# Patient Record
Sex: Male | Born: 1965 | Race: Black or African American | Hispanic: No | Marital: Single | Smoking: Never smoker
Health system: Southern US, Community
[De-identification: ages and names within clinical notes are randomized; demographics above are authoritative.]

## PROBLEM LIST (undated history)

## (undated) DIAGNOSIS — K219 Gastro-esophageal reflux disease without esophagitis: Secondary | ICD-10-CM

## (undated) DIAGNOSIS — K635 Polyp of colon: Secondary | ICD-10-CM

## (undated) DIAGNOSIS — K2210 Ulcer of esophagus without bleeding: Secondary | ICD-10-CM

## (undated) DIAGNOSIS — F32A Depression, unspecified: Secondary | ICD-10-CM

## (undated) DIAGNOSIS — M1710 Unilateral primary osteoarthritis, unspecified knee: Secondary | ICD-10-CM

## (undated) DIAGNOSIS — I10 Essential (primary) hypertension: Secondary | ICD-10-CM

## (undated) DIAGNOSIS — G479 Sleep disorder, unspecified: Secondary | ICD-10-CM

## (undated) DIAGNOSIS — K5732 Diverticulitis of large intestine without perforation or abscess without bleeding: Secondary | ICD-10-CM

## (undated) DIAGNOSIS — G4730 Sleep apnea, unspecified: Secondary | ICD-10-CM

## (undated) DIAGNOSIS — G8929 Other chronic pain: Secondary | ICD-10-CM

## (undated) DIAGNOSIS — M179 Osteoarthritis of knee, unspecified: Secondary | ICD-10-CM

## (undated) DIAGNOSIS — E669 Obesity, unspecified: Secondary | ICD-10-CM

## (undated) DIAGNOSIS — F419 Anxiety disorder, unspecified: Secondary | ICD-10-CM

## (undated) DIAGNOSIS — J301 Allergic rhinitis due to pollen: Secondary | ICD-10-CM

## (undated) DIAGNOSIS — D649 Anemia, unspecified: Secondary | ICD-10-CM

## (undated) DIAGNOSIS — K589 Irritable bowel syndrome without diarrhea: Secondary | ICD-10-CM

## (undated) DIAGNOSIS — K5792 Diverticulitis of intestine, part unspecified, without perforation or abscess without bleeding: Secondary | ICD-10-CM

## (undated) HISTORY — PX: EYE SURGERY: SHX253

## (undated) HISTORY — DX: Other chronic pain: G89.29

## (undated) HISTORY — PX: COLON SURGERY: SHX602

## (undated) HISTORY — DX: Sleep disorder, unspecified: G47.9

## (undated) HISTORY — PX: COLONOSCOPY: SHX174

## (undated) HISTORY — DX: Gastro-esophageal reflux disease without esophagitis: K21.9

## (undated) HISTORY — DX: Osteoarthritis of knee, unspecified: M17.9

## (undated) HISTORY — DX: Ulcer of esophagus without bleeding: K22.10

## (undated) HISTORY — DX: Diverticulitis of large intestine without perforation or abscess without bleeding: K57.32

## (undated) HISTORY — DX: Unilateral primary osteoarthritis, unspecified knee: M17.10

## (undated) HISTORY — DX: Irritable bowel syndrome, unspecified: K58.9

## (undated) HISTORY — PX: ROTATOR CUFF REPAIR: SHX139

## (undated) HISTORY — DX: Polyp of colon: K63.5

## (undated) HISTORY — DX: Obesity, unspecified: E66.9

## (undated) HISTORY — DX: Allergic rhinitis due to pollen: J30.1

## (undated) HISTORY — DX: Depression, unspecified: F32.A

## (undated) HISTORY — PX: CHOLECYSTECTOMY: SHX55

---

## 2004-12-26 HISTORY — PX: COLON RESECTION: SHX5231

## 2006-08-08 ENCOUNTER — Ambulatory Visit: Payer: Self-pay | Admitting: Anesthesiology

## 2006-10-19 ENCOUNTER — Ambulatory Visit: Payer: Self-pay | Admitting: Anesthesiology

## 2006-11-27 ENCOUNTER — Ambulatory Visit: Payer: Self-pay | Admitting: Anesthesiology

## 2006-12-26 HISTORY — PX: ACHILLES TENDON REPAIR: SUR1153

## 2007-01-02 ENCOUNTER — Ambulatory Visit: Payer: Self-pay | Admitting: Anesthesiology

## 2007-12-27 HISTORY — PX: GASTRIC BYPASS: SHX52

## 2013-05-29 ENCOUNTER — Encounter: Payer: Self-pay | Admitting: Internal Medicine

## 2013-05-29 ENCOUNTER — Ambulatory Visit (INDEPENDENT_AMBULATORY_CARE_PROVIDER_SITE_OTHER): Payer: BC Managed Care – PPO | Admitting: Internal Medicine

## 2013-05-29 VITALS — BP 120/80 | HR 88 | Temp 98.4°F | Ht 71.0 in | Wt 295.0 lb

## 2013-05-29 DIAGNOSIS — K635 Polyp of colon: Secondary | ICD-10-CM | POA: Insufficient documentation

## 2013-05-29 DIAGNOSIS — Z23 Encounter for immunization: Secondary | ICD-10-CM

## 2013-05-29 DIAGNOSIS — J301 Allergic rhinitis due to pollen: Secondary | ICD-10-CM

## 2013-05-29 DIAGNOSIS — K5731 Diverticulosis of large intestine without perforation or abscess with bleeding: Secondary | ICD-10-CM | POA: Insufficient documentation

## 2013-05-29 DIAGNOSIS — G479 Sleep disorder, unspecified: Secondary | ICD-10-CM

## 2013-05-29 DIAGNOSIS — M1710 Unilateral primary osteoarthritis, unspecified knee: Secondary | ICD-10-CM

## 2013-05-29 DIAGNOSIS — M179 Osteoarthritis of knee, unspecified: Secondary | ICD-10-CM

## 2013-05-29 DIAGNOSIS — IMO0002 Reserved for concepts with insufficient information to code with codable children: Secondary | ICD-10-CM

## 2013-05-29 DIAGNOSIS — E669 Obesity, unspecified: Secondary | ICD-10-CM | POA: Insufficient documentation

## 2013-05-29 LAB — BASIC METABOLIC PANEL
BUN: 18 mg/dL (ref 6–23)
CO2: 23 mEq/L (ref 19–32)
Calcium: 9.8 mg/dL (ref 8.4–10.5)
Chloride: 106 mEq/L (ref 96–112)
Creatinine, Ser: 1.1 mg/dL (ref 0.4–1.5)
GFR: 94.44 mL/min (ref >60.00)
Glucose, Bld: 89 mg/dL (ref 70–99)
Potassium: 4.8 mEq/L (ref 3.5–5.1)
Sodium: 139 mEq/L (ref 135–145)

## 2013-05-29 LAB — CBC WITH DIFFERENTIAL/PLATELET
Basophils Absolute: 0 10*3/uL (ref 0.0–0.1)
Basophils Relative: 0.2 % (ref 0.0–3.0)
Eosinophils Absolute: 0.2 10*3/uL (ref 0.0–0.7)
Eosinophils Relative: 2.5 % (ref 0.0–5.0)
HCT: 44.7 % (ref 39.0–52.0)
Hemoglobin: 15.4 g/dL (ref 13.0–17.0)
Lymphocytes Relative: 18.4 % (ref 12.0–46.0)
Lymphs Abs: 1.4 10*3/uL (ref 0.7–4.0)
MCHC: 34.5 g/dL (ref 30.0–36.0)
MCV: 89.5 fl (ref 78.0–100.0)
Monocytes Absolute: 0.6 10*3/uL (ref 0.1–1.0)
Monocytes Relative: 7.8 % (ref 3.0–12.0)
Neutro Abs: 5.4 10*3/uL (ref 1.4–7.7)
Neutrophils Relative %: 71.1 % (ref 43.0–77.0)
Platelets: 298 10*3/uL (ref 150.0–400.0)
RBC: 4.99 Mil/uL (ref 4.22–5.81)
RDW: 13.9 % (ref 11.5–14.6)
WBC: 7.6 10*3/uL (ref 4.5–10.5)

## 2013-05-29 LAB — TSH: TSH: 1.66 u[IU]/mL (ref 0.35–5.50)

## 2013-05-29 LAB — HEPATIC FUNCTION PANEL
ALT: 24 U/L (ref 0–53)
AST: 21 U/L (ref 0–37)
Albumin: 4.2 g/dL (ref 3.5–5.2)
Alkaline Phosphatase: 58 U/L (ref 39–117)
Bilirubin, Direct: 0 mg/dL (ref 0.0–0.3)
Total Bilirubin: 0.6 mg/dL (ref 0.3–1.2)
Total Protein: 7.8 g/dL (ref 6.0–8.3)

## 2013-05-29 MED ORDER — PHENTERMINE HCL 37.5 MG PO CAPS: 37.5000 mg | ORAL_CAPSULE | ORAL | Status: DC

## 2013-05-29 MED ORDER — OXYCODONE-ACETAMINOPHEN 5-325 MG PO TABS: 1.0000 | ORAL_TABLET | Freq: Every evening | ORAL | Status: DC | PRN

## 2013-05-29 MED ORDER — TADALAFIL 20 MG PO TABS: 10.0000 mg | ORAL_TABLET | ORAL | Status: DC | PRN

## 2013-05-29 NOTE — Patient Instructions (Signed)
Consider trying glucosamine/chondroitin tabs and capsaicin cream (both available over the counter)--to help your knee pain.  Please get records from Dr Deeann Saint

## 2013-05-29 NOTE — Assessment & Plan Note (Signed)
Chronic problem He wants to hold off on meds since he has to increase doses rapidly

## 2013-05-29 NOTE — Assessment & Plan Note (Signed)
Ongoing pain Discussed other alternatives Will give small amount of percocet for emergency severe pain Consider cortisone injection

## 2013-05-29 NOTE — Assessment & Plan Note (Signed)
Wants to go back on the phentermine Will go ahead and refill

## 2013-05-29 NOTE — Progress Notes (Signed)
Subjective:    Patient ID: Harold Smith, male    DOB: [birth year], 46 y.o.   MRN: [id number]  HPI Here to establish I took care of his dad  Uses meloxicam for left knee pain Not sure it is really helping Has had left over percocet for when pain was severe--mostly used it at night OTC NSAIDs no better Sees Dr Adrian Saran had some cortisone injections Tramadol didn't help in the past either Tolerance to narcotics---has needed dilaudid in past  Chronic sleep problems Hasn't done well with meds--tolerance Hasn't used melatonin  Has known polyps (dad had rectal cancer) Last one done by Dr Florentina Jenny in [place] Due for repeat probably in 2016 (last done in ~2011)  Trouble with his weight Had been up to 400#----lost down to 225# after gastric bypass Gained 75# when dad was sick Finds the phentermine helps---he would like to try this again  Known diverticular disease Had perforation and required surgery in 2006. Has done well since then  Allergies to pollen Uses OTC meds  No current outpatient prescriptions on file prior to visit.   No current facility-administered medications on file prior to visit.    No Known Allergies  Past Medical History  Diagnosis Date  . Allergic rhinitis due to pollen   . Osteoarthritis, knee   . Diverticulitis large intestine   . Colon polyps   . Sleep disturbance   . Obesity     Past Surgical History  Procedure Laterality Date  . Colon resection Left 2006    due to diverticular disease  . Achilles tendon repair Right 2008  . Gastric bypass N/A 2009    Family History  Problem Relation Age of Onset  . Hypertension Mother   . Diabetes Mother   . Kidney disease Mother   . Heart disease Mother   . Cancer Father     rectal cancer  . Asthma Sister   . Obesity Sister   . Obesity Brother   . Diabetes Other   . Heart disease Other   . Hypertension Other   . Kidney disease Other     History   Social History  . Marital  Status: Divorced    Spouse Name: N/A    Number of Children: 3  . Years of Education: N/A   Occupational History  . Commercial truck driver    Social History Main Topics  . Smoking status: Never Smoker   . Smokeless tobacco: Never Used  . Alcohol Use: Yes  . Drug Use: No  . Sexually Active: Not on file   Other Topics Concern  . Not on file   Social History Narrative   Lives with 2 daughters from 1st marriage   Shared custody of another daughter from 2nd marriage   Review of Systems  HENT: Negative for congestion and rhinorrhea.   Respiratory: Negative for cough, chest tightness and shortness of breath.   Cardiovascular: Negative for chest pain and palpitations.  Gastrointestinal: Negative for nausea, vomiting and constipation.       Some cramps after bypass---seems better now  Genitourinary: Negative for frequency and difficulty urinating.       Has had some erection problems--would like to try meds  Musculoskeletal: Positive for arthralgias.  Allergic/Immunologic: Positive for environmental allergies. Negative for immunocompromised state.  Neurological: Negative for dizziness, syncope, light-headedness and headaches.  Psychiatric/Behavioral: Positive for sleep disturbance. Negative for dysphoric mood. The patient is not nervous/anxious.        Only sleeps 3-5 hours per  night---has tried various meds but tremendous tolerance       Objective:   Physical Exam  Constitutional: He appears well-developed and well-nourished. No distress.  HENT:  Mouth/Throat: Oropharynx is clear and moist. No oropharyngeal exudate.  Eyes: Conjunctivae are normal. Pupils are equal, round, and reactive to light.  Neck: Normal range of motion. Neck supple. No thyromegaly present.  Cardiovascular: Normal rate, regular rhythm, normal heart sounds and intact distal pulses.  Exam reveals no gallop.   No murmur heard. Pulmonary/Chest: Effort normal and breath sounds normal. No respiratory distress. He  has no wheezes. He has no rales.  Abdominal: Soft. There is no tenderness.  Musculoskeletal: He exhibits no edema and no tenderness.  Lymphadenopathy:    He has no cervical adenopathy.  Psychiatric: He has a normal mood and affect. His behavior is normal.          Assessment & Plan:

## 2013-05-29 NOTE — Addendum Note (Signed)
Addended by: Sueanne Margarita on: 05/29/2013 12:58 PM   Modules accepted: Orders

## 2013-05-29 NOTE — Assessment & Plan Note (Signed)
Uses OTC meds 

## 2013-05-30 ENCOUNTER — Telehealth: Payer: Self-pay | Admitting: Internal Medicine

## 2013-05-30 ENCOUNTER — Encounter: Payer: Self-pay | Admitting: Internal Medicine

## 2013-05-30 MED ORDER — TERBINAFINE HCL 250 MG PO TABS: 250.0000 mg | ORAL_TABLET | Freq: Every day | ORAL | Status: DC

## 2013-05-30 NOTE — Telephone Encounter (Signed)
See MyChart

## 2013-06-27 ENCOUNTER — Other Ambulatory Visit (INDEPENDENT_AMBULATORY_CARE_PROVIDER_SITE_OTHER): Payer: BC Managed Care – PPO

## 2013-06-27 ENCOUNTER — Encounter: Payer: Self-pay | Admitting: *Deleted

## 2013-06-27 ENCOUNTER — Encounter: Payer: Self-pay | Admitting: Internal Medicine

## 2013-06-27 DIAGNOSIS — Z79899 Other long term (current) drug therapy: Secondary | ICD-10-CM

## 2013-06-27 LAB — HEPATIC FUNCTION PANEL
ALT: 23 U/L (ref 0–53)
AST: 21 U/L (ref 0–37)
Albumin: 4.2 g/dL (ref 3.5–5.2)
Alkaline Phosphatase: 63 U/L (ref 39–117)
Bilirubin, Direct: 0.1 mg/dL (ref 0.0–0.3)
Total Bilirubin: 0.3 mg/dL (ref 0.3–1.2)
Total Protein: 7.3 g/dL (ref 6.0–8.3)

## 2013-07-01 ENCOUNTER — Other Ambulatory Visit: Payer: BC Managed Care – PPO

## 2013-07-03 ENCOUNTER — Encounter: Payer: Self-pay | Admitting: Internal Medicine

## 2013-07-09 ENCOUNTER — Encounter: Payer: Self-pay | Admitting: Internal Medicine

## 2013-07-16 ENCOUNTER — Encounter: Payer: Self-pay | Admitting: Internal Medicine

## 2013-08-23 ENCOUNTER — Ambulatory Visit (INDEPENDENT_AMBULATORY_CARE_PROVIDER_SITE_OTHER): Payer: BC Managed Care – PPO | Admitting: Internal Medicine

## 2013-08-23 ENCOUNTER — Encounter: Payer: Self-pay | Admitting: Internal Medicine

## 2013-08-23 VITALS — BP 120/80 | HR 82 | Temp 98.0°F | Wt 285.0 lb

## 2013-08-23 DIAGNOSIS — M179 Osteoarthritis of knee, unspecified: Secondary | ICD-10-CM

## 2013-08-23 DIAGNOSIS — E669 Obesity, unspecified: Secondary | ICD-10-CM

## 2013-08-23 DIAGNOSIS — IMO0002 Reserved for concepts with insufficient information to code with codable children: Secondary | ICD-10-CM

## 2013-08-23 DIAGNOSIS — M1710 Unilateral primary osteoarthritis, unspecified knee: Secondary | ICD-10-CM

## 2013-08-23 MED ORDER — PHENTERMINE HCL 37.5 MG PO CAPS: 37.5000 mg | ORAL_CAPSULE | ORAL | Status: DC

## 2013-08-23 NOTE — Assessment & Plan Note (Signed)
Sterile prep to left knee Medial approach Ethyl chloride then 2cc 2% lido Only a trace of synovial fluid aspirated 40mg  depomedrol and 5cc 2% lido instilled Tolerated well Discussed home care

## 2013-08-23 NOTE — Progress Notes (Signed)
  Subjective:    Patient ID: Harold Smith, male    DOB: 26-Mar-1966, 47 y.o.   MRN: [id number]  HPI Doing well Using the phentermine--feels it is helping Has lost 10# No palpitations  Is trying to eat healthier Walking some every day-- 2-5 miles  Has used his oxycodone at times Pain is bad in left knee Larey Seat on some gravel about a month ago--fell on left knee  Current Outpatient Prescriptions on File Prior to Visit  Medication Sig Dispense Refill  . cetirizine (ZYRTEC) 10 MG tablet Take 10 mg by mouth daily as needed for allergies.      . meloxicam (MOBIC) 15 MG tablet Take 15 mg by mouth daily.      Marland Kitchen oxyCODONE-acetaminophen (PERCOCET/ROXICET) 5-325 MG per tablet Take 1 tablet by mouth at bedtime as needed for pain.  30 tablet  0  . phentermine 37.5 MG capsule Take 1 capsule (37.5 mg total) by mouth every morning.  30 capsule  2  . pseudoephedrine (SUDAFED) 60 MG tablet Take 60 mg by mouth every 4 (four) hours as needed for congestion.      . tadalafil (CIALIS) 20 MG tablet Take 0.5-1 tablets (10-20 mg total) by mouth every other day as needed for erectile dysfunction.  3 tablet  5  . terbinafine (LAMISIL) 250 MG tablet Take 1 tablet (250 mg total) by mouth daily.  90 tablet  1   No current facility-administered medications on file prior to visit.    No Known Allergies  Past Medical History  Diagnosis Date  . Allergic rhinitis due to pollen   . Osteoarthritis, knee   . Diverticulitis large intestine   . Colon polyps   . Sleep disturbance   . Obesity     Past Surgical History  Procedure Laterality Date  . Colon resection Left 2006    due to diverticular disease  . Achilles tendon repair Right 2008  . Gastric bypass N/A 2009    Family History  Problem Relation Age of Onset  . Hypertension Mother   . Diabetes Mother   . Kidney disease Mother   . Heart disease Mother   . Cancer Father     rectal cancer  . Asthma Sister   . Obesity Sister   . Obesity Brother   .  Diabetes Other   . Heart disease Other   . Hypertension Other   . Kidney disease Other     History   Social History  . Marital Status: Divorced    Spouse Name: N/A    Number of Children: 3  . Years of Education: N/A   Occupational History  . Commercial truck driver    Social History Main Topics  . Smoking status: Never Smoker   . Smokeless tobacco: Never Used  . Alcohol Use: Yes  . Drug Use: No  . Sexual Activity: Not on file   Other Topics Concern  . Not on file   Social History Narrative   Lives with 2 daughters from 1st marriage   Shared custody of another daughter from 2nd marriage   Review of Systems Sleeps well Bowels fine    Objective:   Physical Exam  Constitutional: He appears well-developed and well-nourished. No distress.  Musculoskeletal:  Mild swelling of left knee Walks with full weight bearing No meniscus findings or ligament instability  Psychiatric: He has a normal mood and affect. His behavior is normal.          Assessment & Plan:

## 2013-08-23 NOTE — Patient Instructions (Signed)
DASH Diet  The DASH diet stands for "Dietary Approaches to Stop Hypertension." It is a healthy eating plan that has been shown to reduce high blood pressure (hypertension) in as little as 14 days, while also possibly providing other significant health benefits. These other health benefits include reducing the risk of breast cancer after menopause and reducing the risk of type 2 diabetes, heart disease, colon cancer, and stroke. Health benefits also include weight loss and slowing kidney failure in patients with chronic kidney disease.   DIET GUIDELINES  · Limit salt (sodium). Your diet should contain less than 1500 mg of sodium daily.  · Limit refined or processed carbohydrates. Your diet should include mostly whole grains. Desserts and added sugars should be used sparingly.  · Include small amounts of heart-healthy fats. These types of fats include nuts, oils, and tub margarine. Limit saturated and trans fats. These fats have been shown to be harmful in the body.  CHOOSING FOODS   The following food groups are based on a 2000 calorie diet. See your Registered Dietitian for individual calorie needs.  Grains and Grain Products (6 to 8 servings daily)  · Eat More Often: Whole-wheat bread, brown rice, whole-grain or wheat pasta, quinoa, popcorn without added fat or salt (air popped).  · Eat Less Often: White bread, white pasta, white rice, cornbread.  Vegetables (4 to 5 servings daily)  · Eat More Often: Fresh, frozen, and canned vegetables. Vegetables may be raw, steamed, roasted, or grilled with a minimal amount of fat.  · Eat Less Often/Avoid: Creamed or fried vegetables. Vegetables in a cheese sauce.  Fruit (4 to 5 servings daily)  · Eat More Often: All fresh, canned (in natural juice), or frozen fruits. Dried fruits without added sugar. One hundred percent fruit juice (½ cup [237 mL] daily).  · Eat Less Often: Dried fruits with added sugar. Canned fruit in light or heavy syrup.  Lean Meats, Fish, and Poultry (2  servings or less daily. One serving is 3 to 4 oz [85-114 g]).  · Eat More Often: Ninety percent or leaner ground beef, tenderloin, sirloin. Round cuts of beef, chicken breast, turkey breast. All fish. Grill, bake, or broil your meat. Nothing should be fried.  · Eat Less Often/Avoid: Fatty cuts of meat, turkey, or chicken leg, thigh, or wing. Fried cuts of meat or fish.  Dairy (2 to 3 servings)  · Eat More Often: Low-fat or fat-free milk, low-fat plain or light yogurt, reduced-fat or part-skim cheese.  · Eat Less Often/Avoid: Milk (whole, 2%). Whole milk yogurt. Full-fat cheeses.  Nuts, Seeds, and Legumes (4 to 5 servings per week)  · Eat More Often: All without added salt.  · Eat Less Often/Avoid: Salted nuts and seeds, canned beans with added salt.  Fats and Sweets (limited)  · Eat More Often: Vegetable oils, tub margarines without trans fats, sugar-free gelatin. Mayonnaise and salad dressings.  · Eat Less Often/Avoid: Coconut oils, palm oils, butter, stick margarine, cream, half and half, cookies, candy, pie.  FOR MORE INFORMATION  The Dash Diet Eating Plan: www.dashdiet.org  Document Released: 12/01/2011 Document Revised: 03/05/2012 Document Reviewed: 12/01/2011  Exit[employer]® Patient Information ©2014 [employer].

## 2013-08-23 NOTE — Assessment & Plan Note (Signed)
Has lost 10# on the phentermine Discussed lifestyle Will continue the med for now

## 2013-08-29 ENCOUNTER — Ambulatory Visit: Payer: BC Managed Care – PPO | Admitting: Internal Medicine

## 2013-10-01 ENCOUNTER — Encounter: Payer: Self-pay | Admitting: Internal Medicine

## 2013-10-01 MED ORDER — IBUPROFEN 800 MG PO TABS: 800.0000 mg | ORAL_TABLET | Freq: Three times a day (TID) | ORAL | Status: DC | PRN

## 2013-10-01 MED ORDER — OXYCODONE-ACETAMINOPHEN 5-325 MG PO TABS: 1.0000 | ORAL_TABLET | Freq: Every evening | ORAL | Status: DC | PRN

## 2013-10-01 NOTE — Telephone Encounter (Signed)
See MyChart message

## 2013-10-17 DIAGNOSIS — I10 Essential (primary) hypertension: Secondary | ICD-10-CM | POA: Insufficient documentation

## 2013-10-31 ENCOUNTER — Other Ambulatory Visit: Payer: Self-pay

## 2013-11-12 DIAGNOSIS — K439 Ventral hernia without obstruction or gangrene: Secondary | ICD-10-CM | POA: Insufficient documentation

## 2014-02-03 ENCOUNTER — Ambulatory Visit (INDEPENDENT_AMBULATORY_CARE_PROVIDER_SITE_OTHER): Payer: BC Managed Care – PPO | Admitting: Internal Medicine

## 2014-02-03 ENCOUNTER — Encounter: Payer: Self-pay | Admitting: Internal Medicine

## 2014-02-03 ENCOUNTER — Ambulatory Visit (INDEPENDENT_AMBULATORY_CARE_PROVIDER_SITE_OTHER)
Admission: RE | Admit: 2014-02-03 | Discharge: 2014-02-03 | Disposition: A | Discharge status: Home / Self Care | Payer: BC Managed Care – PPO | Source: Ambulatory Visit | Attending: Internal Medicine | Admitting: Internal Medicine

## 2014-02-03 VITALS — BP 120/80 | HR 74 | Temp 98.5°F | Wt 320.0 lb

## 2014-02-03 DIAGNOSIS — E669 Obesity, unspecified: Secondary | ICD-10-CM

## 2014-02-03 DIAGNOSIS — IMO0002 Reserved for concepts with insufficient information to code with codable children: Secondary | ICD-10-CM

## 2014-02-03 DIAGNOSIS — M179 Osteoarthritis of knee, unspecified: Secondary | ICD-10-CM

## 2014-02-03 DIAGNOSIS — M1710 Unilateral primary osteoarthritis, unspecified knee: Secondary | ICD-10-CM

## 2014-02-03 NOTE — Patient Instructions (Signed)
DASH Diet  The DASH diet stands for "Dietary Approaches to Stop Hypertension." It is a healthy eating plan that has been shown to reduce high blood pressure (hypertension) in as little as 14 days, while also possibly providing other significant health benefits. These other health benefits include reducing the risk of breast cancer after menopause and reducing the risk of type 2 diabetes, heart disease, colon cancer, and stroke. Health benefits also include weight loss and slowing kidney failure in patients with chronic kidney disease.   DIET GUIDELINES  · Limit salt (sodium). Your diet should contain less than 1500 mg of sodium daily.  · Limit refined or processed carbohydrates. Your diet should include mostly whole grains. Desserts and added sugars should be used sparingly.  · Include small amounts of heart-healthy fats. These types of fats include nuts, oils, and tub margarine. Limit saturated and trans fats. These fats have been shown to be harmful in the body.  CHOOSING FOODS   The following food groups are based on a 2000 calorie diet. See your Registered Dietitian for individual calorie needs.  Grains and Grain Products (6 to 8 servings daily)  · Eat More Often: Whole-wheat bread, brown rice, whole-grain or wheat pasta, quinoa, popcorn without added fat or salt (air popped).  · Eat Less Often: White bread, white pasta, white rice, cornbread.  Vegetables (4 to 5 servings daily)  · Eat More Often: Fresh, frozen, and canned vegetables. Vegetables may be raw, steamed, roasted, or grilled with a minimal amount of fat.  · Eat Less Often/Avoid: Creamed or fried vegetables. Vegetables in a cheese sauce.  Fruit (4 to 5 servings daily)  · Eat More Often: All fresh, canned (in natural juice), or frozen fruits. Dried fruits without added sugar. One hundred percent fruit juice (½ cup [237 mL] daily).  · Eat Less Often: Dried fruits with added sugar. Canned fruit in light or heavy syrup.  Lean Meats, Fish, and Poultry (2  servings or less daily. One serving is 3 to 4 oz [85-114 g]).  · Eat More Often: Ninety percent or leaner ground beef, tenderloin, sirloin. Round cuts of beef, chicken breast, turkey breast. All fish. Grill, bake, or broil your meat. Nothing should be fried.  · Eat Less Often/Avoid: Fatty cuts of meat, turkey, or chicken leg, thigh, or wing. Fried cuts of meat or fish.  Dairy (2 to 3 servings)  · Eat More Often: Low-fat or fat-free milk, low-fat plain or light yogurt, reduced-fat or part-skim cheese.  · Eat Less Often/Avoid: Milk (whole, 2%). Whole milk yogurt. Full-fat cheeses.  Nuts, Seeds, and Legumes (4 to 5 servings per week)  · Eat More Often: All without added salt.  · Eat Less Often/Avoid: Salted nuts and seeds, canned beans with added salt.  Fats and Sweets (limited)  · Eat More Often: Vegetable oils, tub margarines without trans fats, sugar-free gelatin. Mayonnaise and salad dressings.  · Eat Less Often/Avoid: Coconut oils, palm oils, butter, stick margarine, cream, half and half, cookies, candy, pie.  FOR MORE INFORMATION  The Dash Diet Eating Plan: www.dashdiet.org  Document Released: 12/01/2011 Document Revised: 03/05/2012 Document Reviewed: 12/01/2011  Exit[employer]® Patient Information ©2014 [employer].

## 2014-02-03 NOTE — Progress Notes (Signed)
Pre-visit discussion using our clinic review tool. No additional management support is needed unless otherwise documented below in the visit note.  

## 2014-02-03 NOTE — Assessment & Plan Note (Signed)
Will check x-ray May have partial ACL tear--but no further eval needed (doesn't give out)---may want brace  PROCEDURE Sterile prep to medial left knee Ethyl chloride then 2cc 2% lidocaine 40mg  depomedrol and 6cc 2% lidocaine instilled Tolerated well Discussed home care

## 2014-02-03 NOTE — Progress Notes (Signed)
Subjective:    Patient ID: Harold Smith, male    DOB: 11-21-1966, 48 y.o.   MRN: [id number]  HPI Having ongoing left knee pain Had felt better after the steroid injection in August Only just starting to have problems again Intermittently wears a brace Uses the ibuprofen prn  Has stopped the phentermine Weight has gone up Lots going on with his weight Had surgery for hiatal hernia in October--then had period of inactivity Some limitation due to knee-- affects his ability to work out at [employer]  Current Outpatient Prescriptions on File Prior to Visit  Medication Sig Dispense Refill  . cetirizine (ZYRTEC) 10 MG tablet Take 10 mg by mouth daily as needed for allergies.      Marland Kitchen ibuprofen (ADVIL,MOTRIN) 800 MG tablet Take 1 tablet (800 mg total) by mouth 3 (three) times daily as needed for pain.  100 tablet  3  . oxyCODONE-acetaminophen (PERCOCET/ROXICET) 5-325 MG per tablet Take 1 tablet by mouth at bedtime as needed for pain.  30 tablet  0  . phentermine 37.5 MG capsule Take 1 capsule (37.5 mg total) by mouth every morning.  30 capsule  5  . pseudoephedrine (SUDAFED) 60 MG tablet Take 60 mg by mouth every 4 (four) hours as needed for congestion.      . tadalafil (CIALIS) 20 MG tablet Take 0.5-1 tablets (10-20 mg total) by mouth every other day as needed for erectile dysfunction.  3 tablet  5   No current facility-administered medications on file prior to visit.    No Known Allergies  Past Medical History  Diagnosis Date  . Allergic rhinitis due to pollen   . Osteoarthritis, knee   . Diverticulitis large intestine   . Colon polyps   . Sleep disturbance   . Obesity     Past Surgical History  Procedure Laterality Date  . Colon resection Left 2006    due to diverticular disease  . Achilles tendon repair Right 2008  . Gastric bypass N/A 2009  . Hiatal hernia repair  10/15    at [facility] History  Problem Relation Age of Onset  . Hypertension Mother   . Diabetes  Mother   . Kidney disease Mother   . Heart disease Mother   . Cancer Father     rectal cancer  . Asthma Sister   . Obesity Sister   . Obesity Brother   . Diabetes Other   . Heart disease Other   . Hypertension Other   . Kidney disease Other     History   Social History  . Marital Status: Divorced    Spouse Name: N/A    Number of Children: 3  . Years of Education: N/A   Occupational History  . Commercial truck driver    Social History Main Topics  . Smoking status: Never Smoker   . Smokeless tobacco: Never Used  . Alcohol Use: Yes  . Drug Use: No  . Sexual Activity: Not on file   Other Topics Concern  . Not on file   Social History Narrative   Lives with 2 daughters from 80st marriage   Shared custody of another daughter from 18nd marriage   Review of Systems Sleeps fairly well--variable. Some tossing and turning Work has been slow     Objective:   Physical Exam  Constitutional: He appears well-developed and well-nourished. No distress.  Musculoskeletal:  Left knee--no sig effusion No meniscus findings ?slightly loose on ACL testing ([facility])--but  not clear cut          Assessment & Plan:

## 2014-02-03 NOTE — Assessment & Plan Note (Signed)
Has let things slide Off the phentermine Will increase exercise efforts and work on better eating

## 2014-02-18 ENCOUNTER — Emergency Department: Payer: Self-pay | Admitting: Emergency Medicine

## 2014-02-18 LAB — BASIC METABOLIC PANEL
Anion Gap: 6 — ABNORMAL LOW (ref 7–16)
BUN: 15 mg/dL (ref 7–18)
Calcium, Total: 9 mg/dL (ref 8.5–10.1)
Chloride: 108 mmol/L — ABNORMAL HIGH (ref 98–107)
Co2: 25 mmol/L (ref 21–32)
Creatinine: 0.91 mg/dL (ref 0.60–1.30)
EGFR (African American): >60
EGFR (Non-African Amer.): >60
Glucose: 89 mg/dL (ref 65–99)
Osmolality: 278 (ref 275–301)
Potassium: 3.8 mmol/L (ref 3.5–5.1)
Sodium: 139 mmol/L (ref 136–145)

## 2014-02-18 LAB — CBC
HCT: 46.8 % (ref 40.0–52.0)
HGB: 15.9 g/dL (ref 13.0–18.0)
MCH: 30.7 pg (ref 26.0–34.0)
MCHC: 33.9 g/dL (ref 32.0–36.0)
MCV: 91 fL (ref 80–100)
Platelet: 279 10*3/uL (ref 150–440)
RBC: 5.17 10*6/uL (ref 4.40–5.90)
RDW: 14.5 % (ref 11.5–14.5)
WBC: 10.2 10*3/uL (ref 3.8–10.6)

## 2014-02-21 ENCOUNTER — Encounter: Payer: Self-pay | Admitting: Internal Medicine

## 2014-02-23 LAB — CULTURE, BLOOD (SINGLE)

## 2014-02-26 ENCOUNTER — Encounter: Payer: BC Managed Care – PPO | Admitting: Internal Medicine

## 2014-03-11 ENCOUNTER — Other Ambulatory Visit: Payer: Self-pay | Admitting: Internal Medicine

## 2014-03-11 NOTE — Telephone Encounter (Signed)
He had been off and trying with lifestyle but obviously wants to try again. I will refill and we will review how he is doing at his August visit

## 2014-03-11 NOTE — Telephone Encounter (Signed)
Spoke with patient and advised rx ready for pick-up and it will be at the front desk.  

## 2014-04-28 ENCOUNTER — Encounter: Payer: Self-pay | Admitting: Internal Medicine

## 2014-04-29 ENCOUNTER — Ambulatory Visit (INDEPENDENT_AMBULATORY_CARE_PROVIDER_SITE_OTHER): Payer: No Typology Code available for payment source | Admitting: Internal Medicine

## 2014-04-29 ENCOUNTER — Encounter: Payer: Self-pay | Admitting: Internal Medicine

## 2014-04-29 VITALS — BP 140/80 | HR 78 | Temp 98.6°F | Ht 71.0 in | Wt 319.0 lb

## 2014-04-29 DIAGNOSIS — Z01818 Encounter for other preprocedural examination: Secondary | ICD-10-CM | POA: Insufficient documentation

## 2014-04-29 NOTE — Assessment & Plan Note (Signed)
No worrisome symptoms Exam benign EKG normal   Okay to proceed with this low risk procedure

## 2014-04-29 NOTE — Progress Notes (Signed)
Subjective:    Patient ID: Harold Smith, male    DOB: [birth year], 48 y.o.   MRN: [id number]  HPI Getting arthroscopy on left knee by Dr Noemi Chapel He has asked for preop medical clearance  Felt pop in the knee while walking 5 days ago Had to go to urgent care due to severe pain Got MRI 3 days ago Has torn meniscus  No chest pain Very limited activity---no apparent change in tolerance No SOB No dizziness or syncope  No recent respiratory illness No cough  Current Outpatient Prescriptions on File Prior to Visit  Medication Sig Dispense Refill  . cetirizine (ZYRTEC) 10 MG tablet Take 10 mg by mouth daily as needed for allergies.      Marland Kitchen ibuprofen (ADVIL,MOTRIN) 800 MG tablet Take 1 tablet (800 mg total) by mouth 3 (three) times daily as needed for pain.  100 tablet  3  . phentermine 37.5 MG capsule TAKE ONE CAPSULE BY MOUTH IN THE MORNING  30 capsule  5  . tadalafil (CIALIS) 20 MG tablet Take 0.5-1 tablets (10-20 mg total) by mouth every other day as needed for erectile dysfunction.  3 tablet  5   No current facility-administered medications on file prior to visit.    No Known Allergies  Past Medical History  Diagnosis Date  . Allergic rhinitis due to pollen   . Osteoarthritis, knee   . Diverticulitis large intestine   . Colon polyps   . Sleep disturbance   . Obesity     Past Surgical History  Procedure Laterality Date  . Colon resection Left 2006    due to diverticular disease  . Achilles tendon repair Right 2008  . Gastric bypass N/A 2009  . Hiatal hernia repair  10/15    at [facility] History  Problem Relation Age of Onset  . Hypertension Mother   . Diabetes Mother   . Kidney disease Mother   . Heart disease Mother   . Cancer Father     rectal cancer  . Asthma Sister   . Obesity Sister   . Obesity Brother   . Diabetes Other   . Heart disease Other   . Hypertension Other   . Kidney disease Other     History   Social History  . Marital Status:  Divorced    Spouse Name: N/A    Number of Children: 3  . Years of Education: N/A   Occupational History  . Commercial truck driver    Social History Main Topics  . Smoking status: Never Smoker   . Smokeless tobacco: Never Used  . Alcohol Use: Yes  . Drug Use: No  . Sexual Activity: Not on file   Other Topics Concern  . Not on file   Social History Narrative   Lives with 2 daughters from 62st marriage   Shared custody of another daughter from 2nd marriage   Review of Systems Appetite is okay Weight stable Sleeps okay    Objective:   Physical Exam  Constitutional: He appears well-developed and well-nourished. No distress.  Neck: Normal range of motion. Neck supple. No thyromegaly present.  Cardiovascular: Normal rate, regular rhythm, normal heart sounds and intact distal pulses.  Exam reveals no gallop.   No murmur heard. Pulmonary/Chest: Effort normal and breath sounds normal. No respiratory distress. He has no wheezes. He has no rales.  Abdominal: Soft. There is no tenderness.  Musculoskeletal: He exhibits no edema and no tenderness.  Lymphadenopathy:  He has no cervical adenopathy.          Assessment & Plan:

## 2014-04-29 NOTE — Progress Notes (Signed)
Pre visit review using our clinic review tool, if applicable. No additional management support is needed unless otherwise documented below in the visit note. 

## 2014-05-09 ENCOUNTER — Encounter: Payer: Self-pay | Admitting: Internal Medicine

## 2014-05-26 ENCOUNTER — Ambulatory Visit: Payer: Self-pay | Admitting: Gastroenterology

## 2014-05-28 LAB — PATHOLOGY REPORT

## 2014-07-31 ENCOUNTER — Ambulatory Visit: Payer: Self-pay | Admitting: Specialist

## 2014-08-04 ENCOUNTER — Encounter: Payer: BC Managed Care – PPO | Admitting: Internal Medicine

## 2014-09-25 HISTORY — PX: HIATAL HERNIA REPAIR: SHX195

## 2014-09-25 HISTORY — PX: ROUX-EN-Y GASTRIC BYPASS: SHX1104

## 2014-10-07 ENCOUNTER — Encounter: Payer: Self-pay | Admitting: Internal Medicine

## 2014-10-08 MED ORDER — PHENTERMINE HCL 37.5 MG PO CAPS: ORAL_CAPSULE | ORAL | Status: DC

## 2014-10-08 NOTE — Telephone Encounter (Signed)
Called to [facility]

## 2014-10-08 NOTE — Telephone Encounter (Signed)
Okay to refill for 6 months Phone in if you are able--otherwise print Rx and I will sign tomorrow

## 2014-11-17 IMAGING — CT CT NECK WITH CONTRAST
4 of 5 series · 15 of 33 positions shown, 17 images · IV contrast (isovue)
Comparison: None.

CLINICAL DATA: c/o posterior neck pain x 2 days. Patient states he
was getting out of bed tonight and fell back into bed causing him to
bite tongue. With inspection, patient with large area of swelling to
base of neck; hot to touch marked by BB. no hx CA, no hx surg.

EXAM:
CT NECK WITH CONTRAST
TECHNIQUE: Multidetector CT imaging of the neck was performed using the
standard protocol following the bolus administration of intravenous
contrast.
CONTRAST:  75 mL Isovue 370.

[Series 2: axial neck · axial · 0.57mm/px · z∈[-316,-196]mm · 3 of 122 slices shown]
[im 31/122  bone]
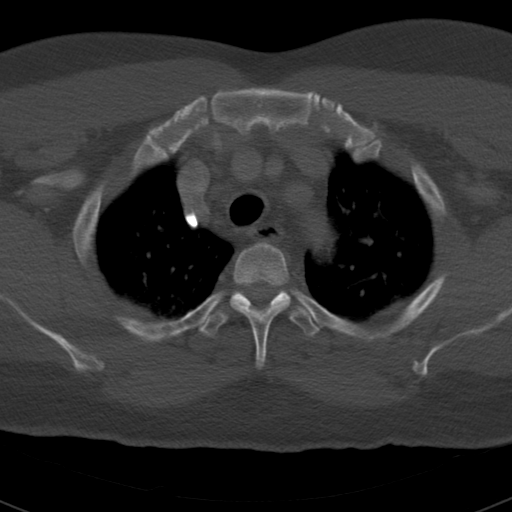
[im 61/122  bone]
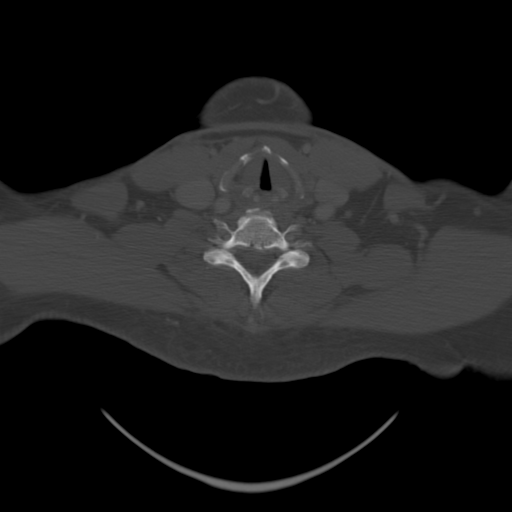
[im 91/122  bone]
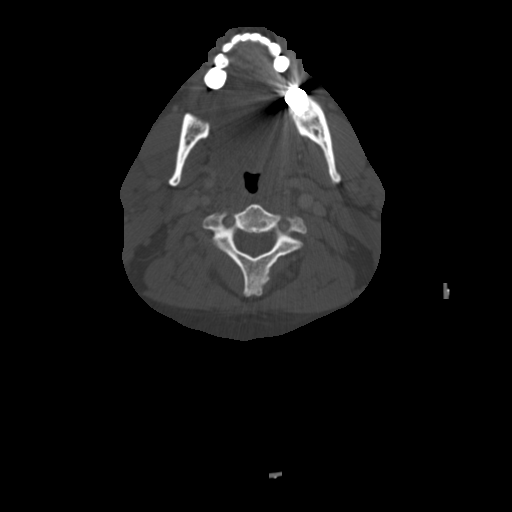

[Series 5: sag neck · sagittal · 0.48mm/px · 5 of 146 slices shown, 6 images]
[im 49/146  bone]
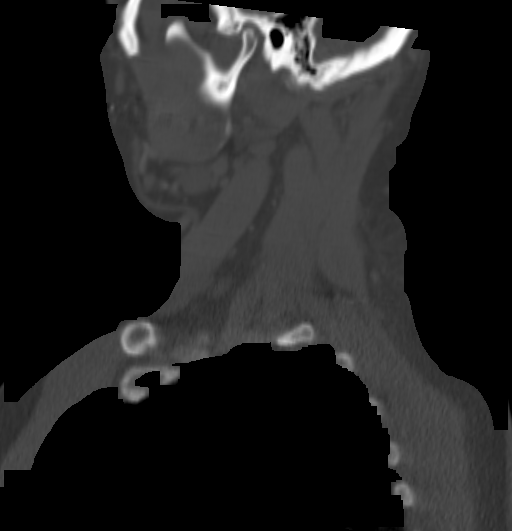
[im 61/146  bone]
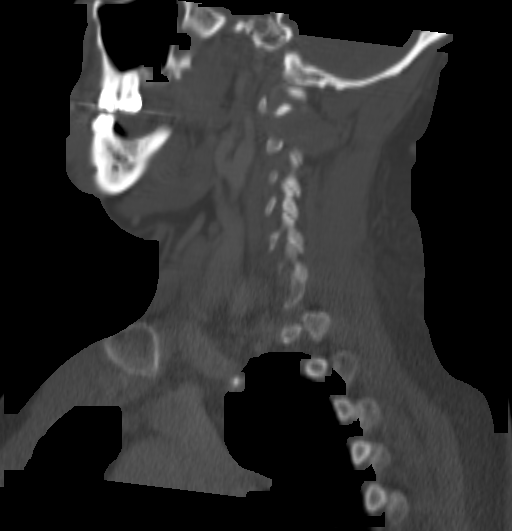
[im 73/146  soft-tissue]
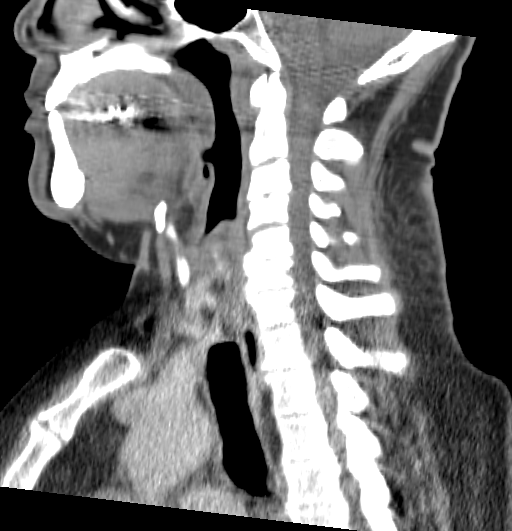
[im 73/146  bone]
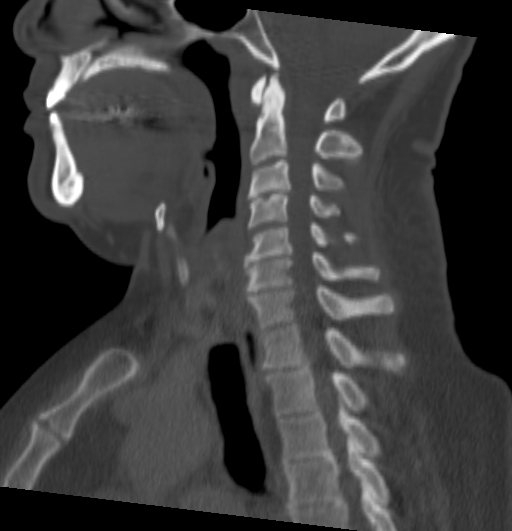
[im 85/146  bone]
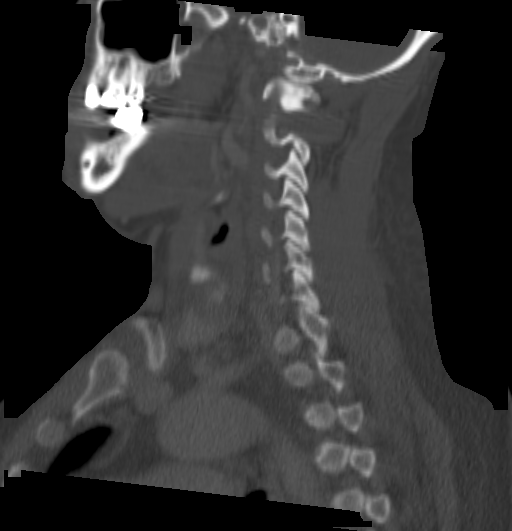
[im 97/146  bone]
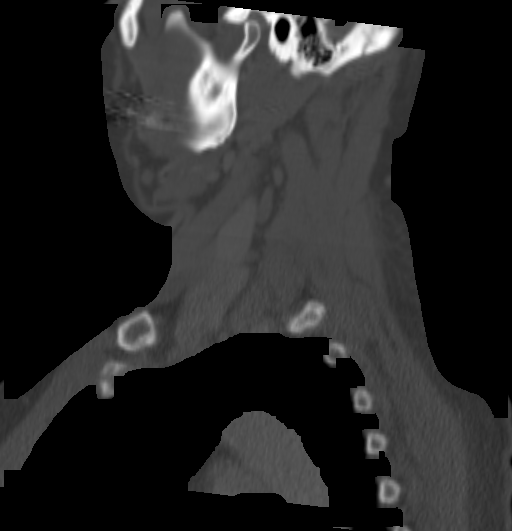

[Series 6: cor neck · coronal · 0.50mm/px · 3 of 137 slices shown]
[im 28/137  bone]
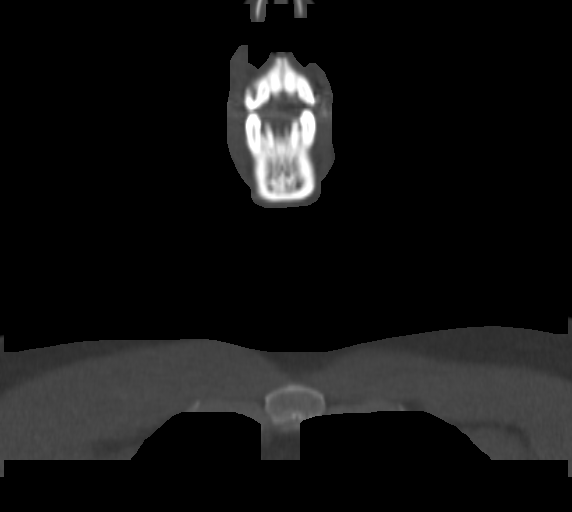
[im 55/137  bone]
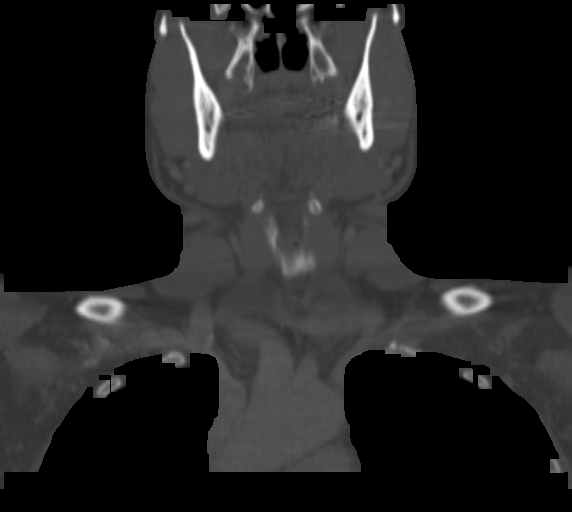
[im 82/137  bone]
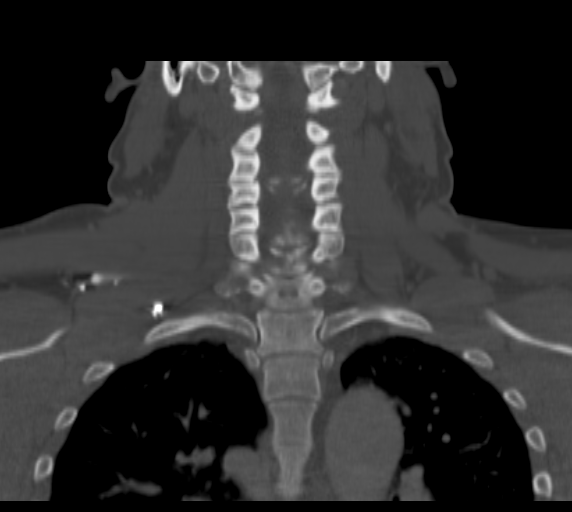

[Series 7: ax oropharynx · axial · 0.52mm/px · z∈[-348,-206]mm · 4 of 120 slices shown, 5 images]
[im 24/120  soft-tissue]
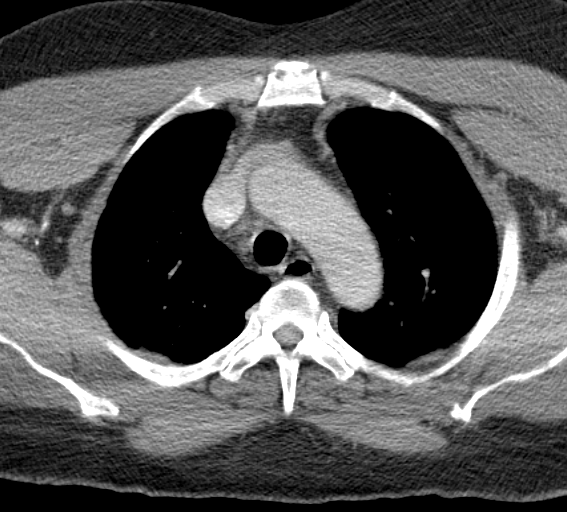
[im 24/120  bone]
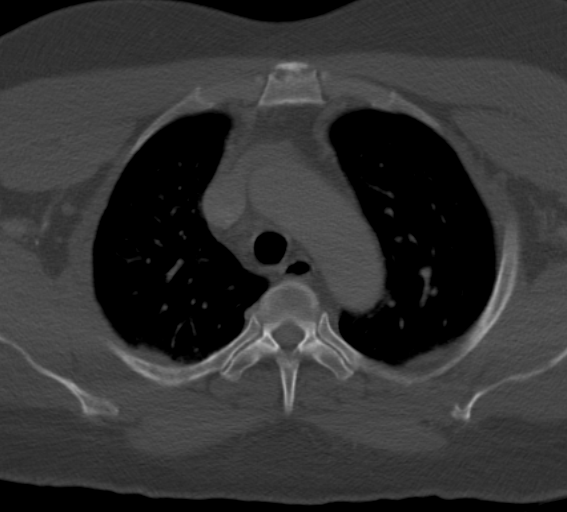
[im 48/120  bone]
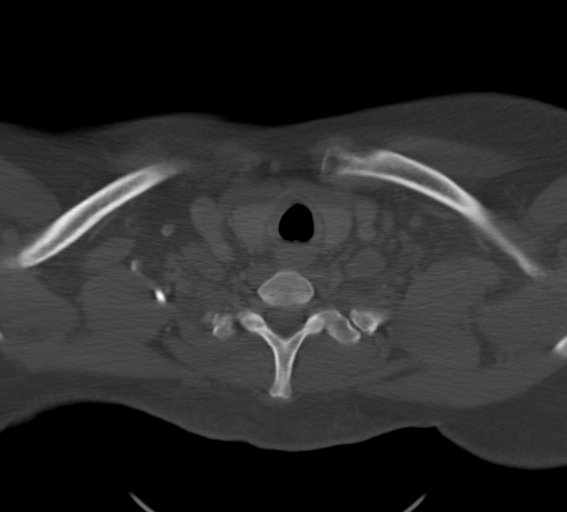
[im 72/120  bone]
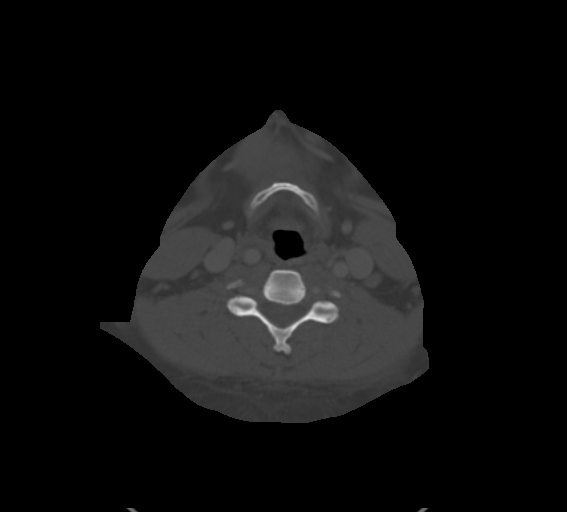
[im 96/120  bone]
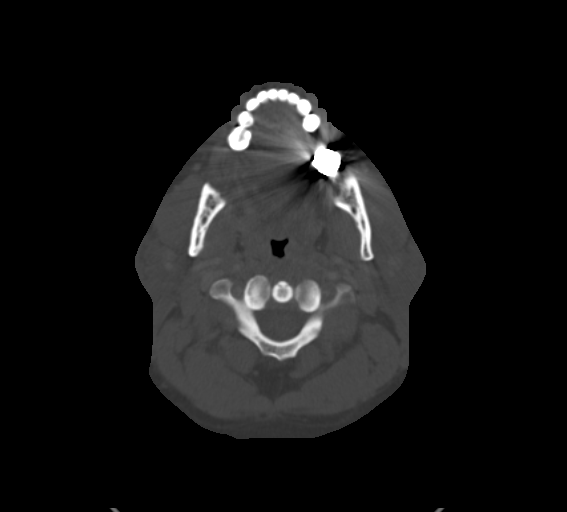

[15 of 33 positions shown; findings below may reference images not displayed]

FINDINGS: The skull base is unremarkable. The pharyngeal, glottic and
laryngeal regions are unjremarkable. The spaces of the neck are
maintained. The salivary glands are symmetric without enhancement
abnormalities. The parotid glands are symmetric without enhancement.
The opacified vascular structures are unremarkable. Subcentimeter
lymph nodes are identified within the anterior and posterior
cervical chains. There is no evidence of masses, free fluid or
loculated fluid collections. The posterior base of the neck, upper
back region in area of clinical concern demonstrates no radiographic
abnormalities. The lung apices are unremarkable. The airway is
patent. The thyroid demonstrates no abnormalities.

There is no evidence of aggressive appearing osseous lesions nor
acute abnormalities.
IMPRESSION: Neck CT without evidence of focal acute abnormalities. Specifically,
in the region of clinical concern no focal or acute is identified.

## 2014-11-30 ENCOUNTER — Ambulatory Visit: Payer: Self-pay | Admitting: Emergency Medicine

## 2014-11-30 LAB — DOT URINE DIP
Glucose,UR: NEGATIVE
Protein: NEGATIVE
Specific Gravity: 1.025 (ref 1.000–1.030)

## 2015-04-29 IMAGING — US ABDOMEN ULTRASOUND LIMITED
1 series · 14 of 25 positions shown · non-contrast
Comparison: None.

CLINICAL DATA: Obesity, preop for gastric bypass

EXAM:
US ABDOMEN LIMITED - RIGHT UPPER QUADRANT

[Series 1: abdomen ultrasound limited · 0.28mm/px · 14 of 32 slices shown]
[im 1/32]
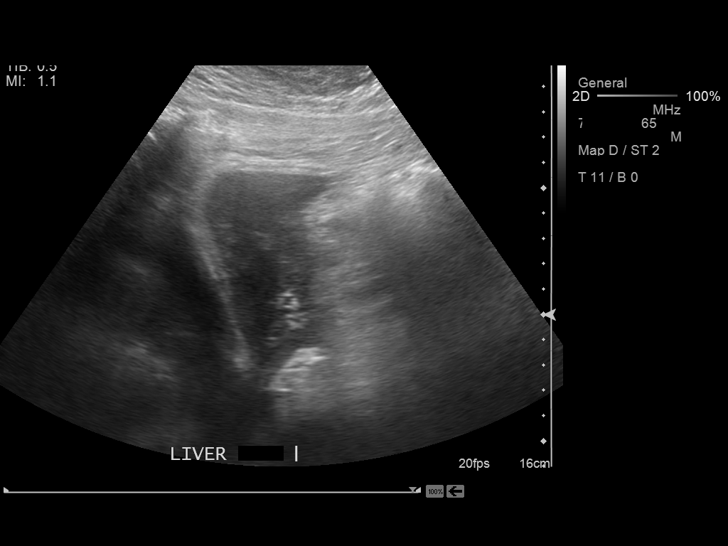
[im 3/32]
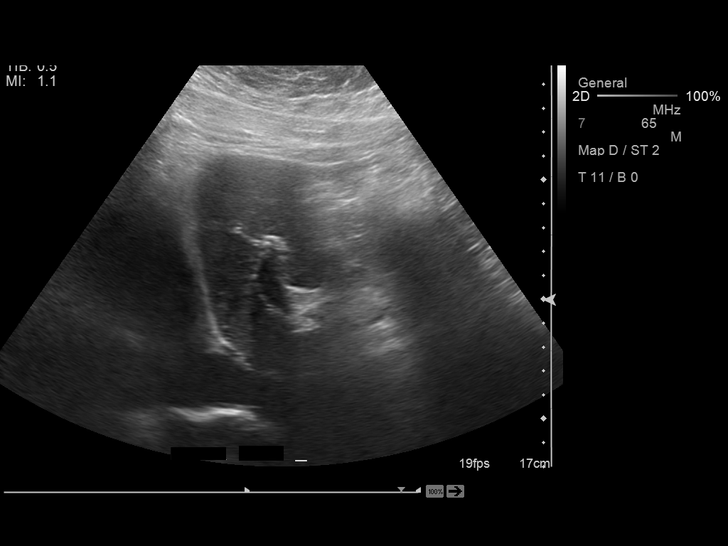
[im 6/32]
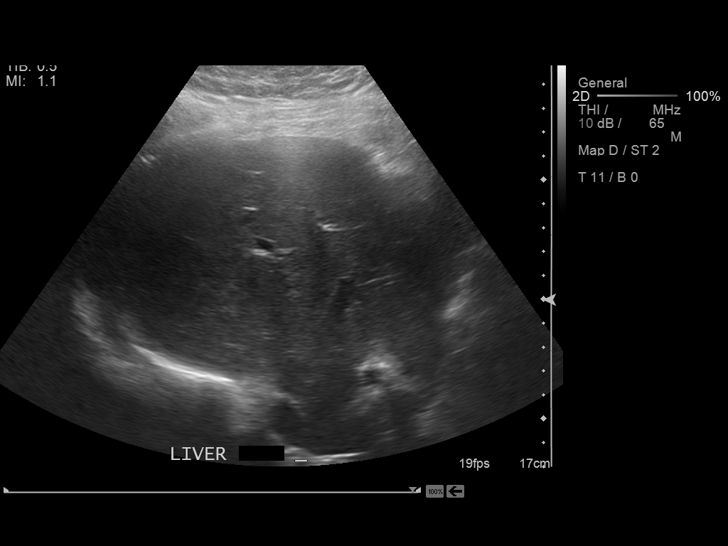
[im 8/32]
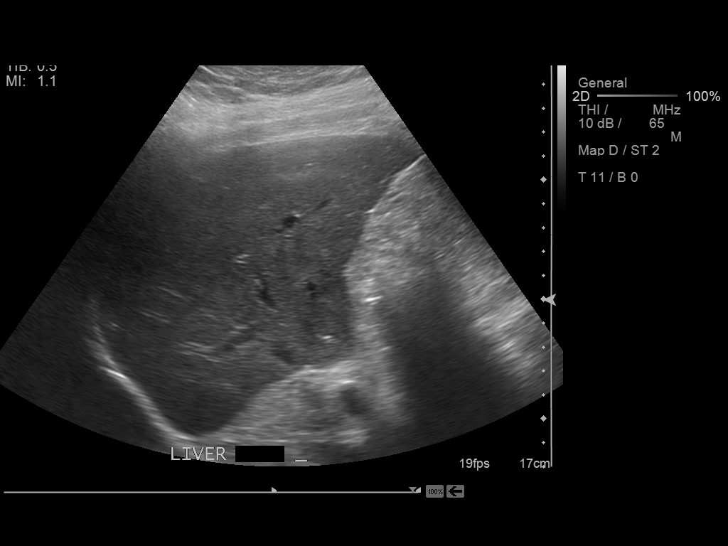
[im 11/32]
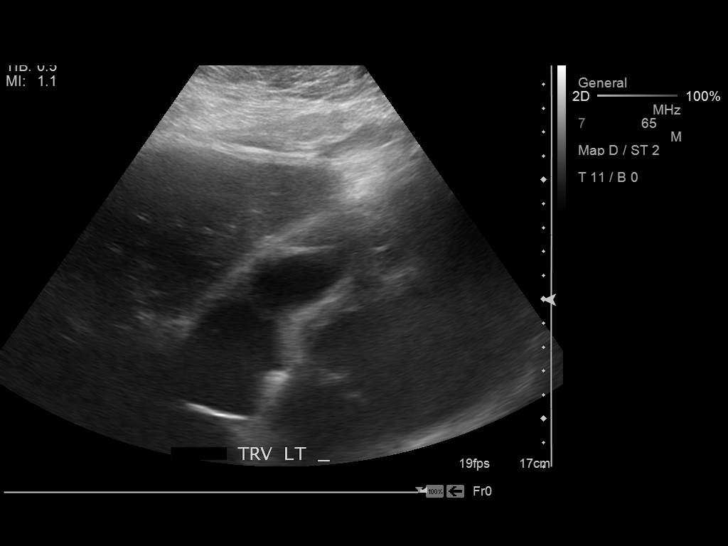
[im 12/32]
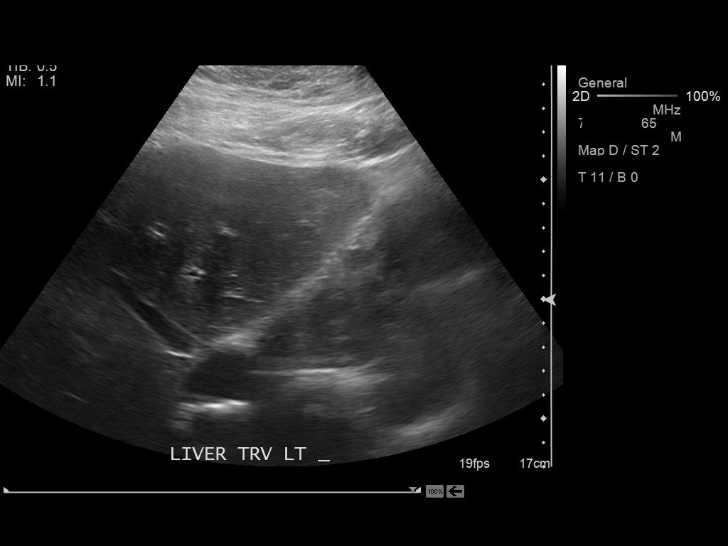
[im 15/32]
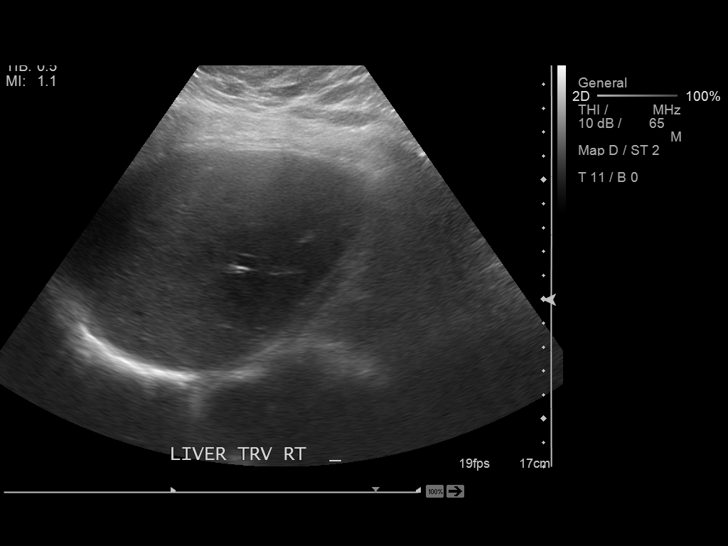
[im 17/32]
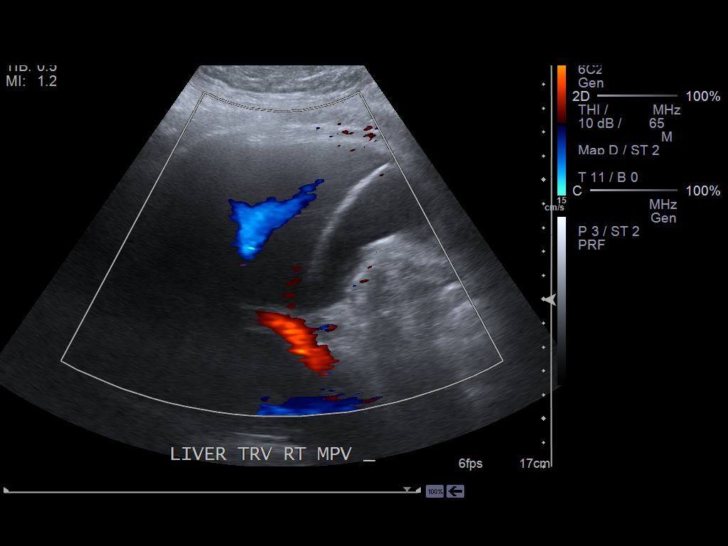
[im 20/32]
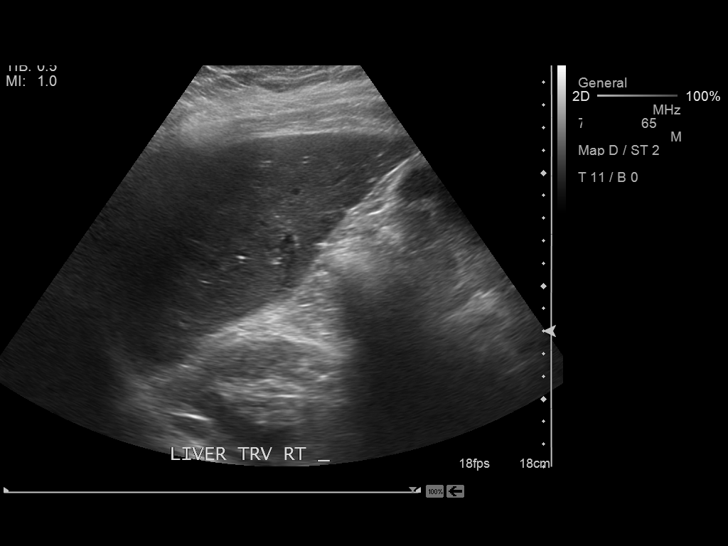
[im 21/32]
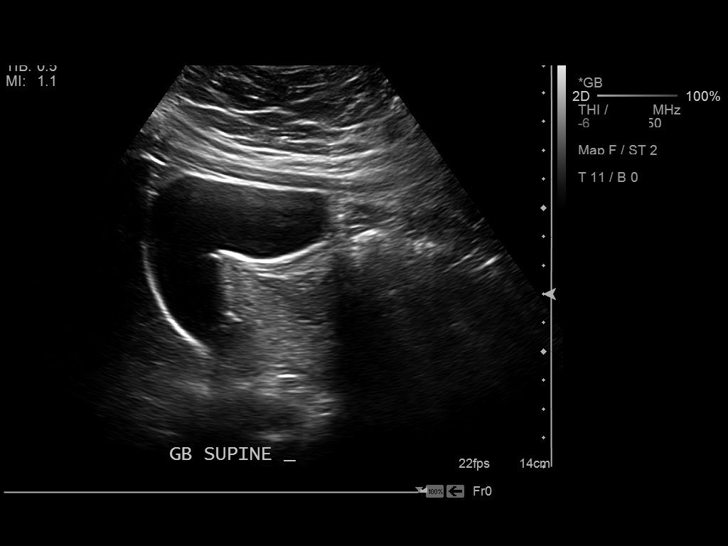
[im 24/32]
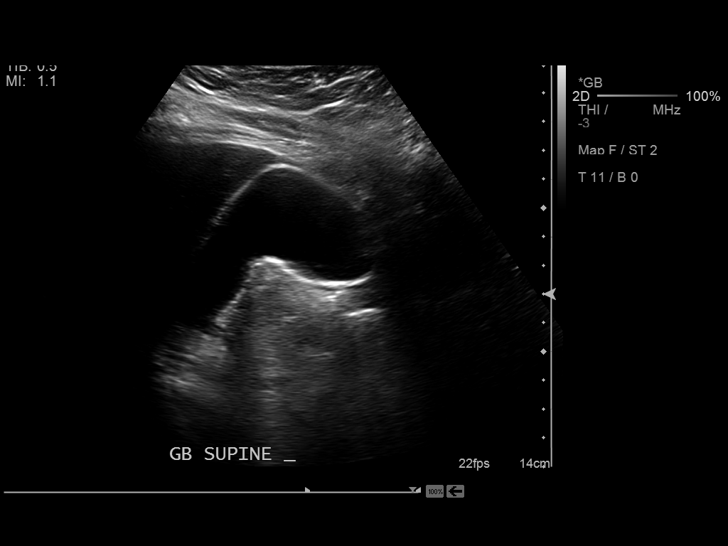
[im 26/32]
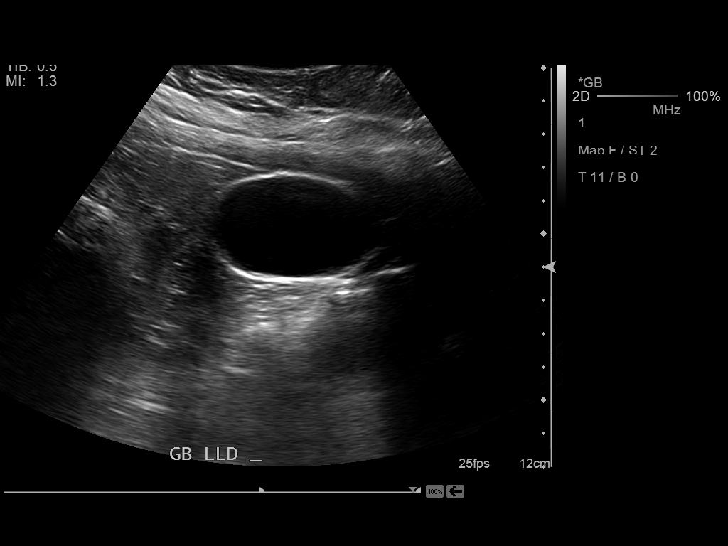
[im 29/32]
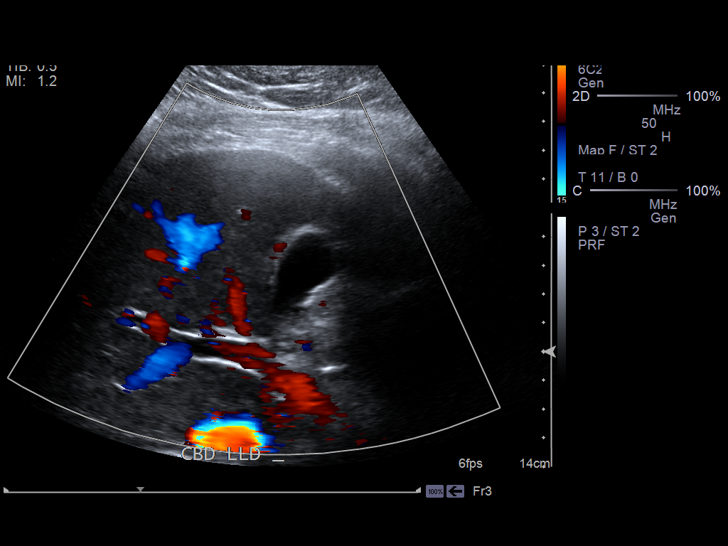
[im 32/32]
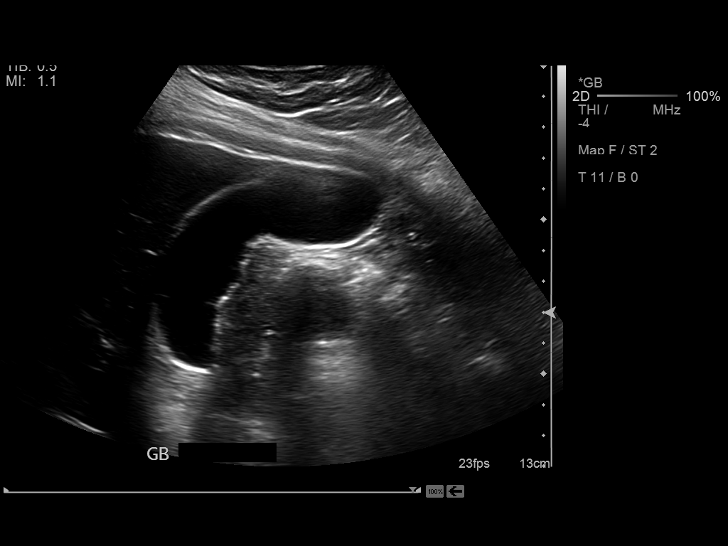

[14 of 25 positions shown; findings below may reference images not displayed]

FINDINGS: Gallbladder:

No gallstones or wall thickening visualized. No sonographic Murphy
sign noted.

Common bile duct:

Diameter:

Liver:

No focal lesion identified. Within normal limits in parenchymal
echogenicity.
IMPRESSION: Normal right upper quadrant ultrasound.

## 2015-08-18 DIAGNOSIS — K810 Acute cholecystitis: Secondary | ICD-10-CM | POA: Insufficient documentation

## 2015-08-30 ENCOUNTER — Other Ambulatory Visit: Payer: Self-pay | Admitting: Internal Medicine

## 2015-09-01 NOTE — Telephone Encounter (Signed)
10/08/14 

## 2015-09-01 NOTE — Telephone Encounter (Signed)
rx called into pharmacy

## 2015-09-01 NOTE — Telephone Encounter (Signed)
Approved: okay #[phone] x 3 Need appt before refill---preferably PE

## 2015-12-06 ENCOUNTER — Other Ambulatory Visit: Payer: Self-pay | Admitting: Internal Medicine

## 2015-12-17 ENCOUNTER — Telehealth: Payer: Self-pay | Admitting: Family Medicine

## 2015-12-17 NOTE — Telephone Encounter (Signed)
Requesting return call. He is struggling with Hemorrhoids for about 2 months. Has tried preparation H ointment and suppositories and they are not helping. He has also tried the cleaning cloth with with hazel. Have noticed blood when wiping. It burns and have began to develop sharp pains. Have also tried 800 mg of ibuprofen for inflammation. 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 I advised him to schedule appointment just in case your not able to return his call for advise and he did. He is just needing some relief. He uses [facility]

## 2015-12-22 ENCOUNTER — Encounter: Payer: Self-pay | Admitting: Family Medicine

## 2015-12-22 ENCOUNTER — Ambulatory Visit (INDEPENDENT_AMBULATORY_CARE_PROVIDER_SITE_OTHER): Payer: No Typology Code available for payment source | Admitting: Family Medicine

## 2015-12-22 VITALS — BP 118/72 | HR 101 | Temp 98.5°F | Resp 18 | Wt 213.7 lb

## 2015-12-22 DIAGNOSIS — K649 Unspecified hemorrhoids: Secondary | ICD-10-CM

## 2015-12-22 DIAGNOSIS — K5731 Diverticulosis of large intestine without perforation or abscess with bleeding: Secondary | ICD-10-CM

## 2015-12-22 DIAGNOSIS — K635 Polyp of colon: Secondary | ICD-10-CM | POA: Diagnosis not present

## 2015-12-22 DIAGNOSIS — K625 Hemorrhage of anus and rectum: Secondary | ICD-10-CM | POA: Insufficient documentation

## 2015-12-22 LAB — HEMOCCULT GUIAC POC 1CARD (OFFICE): Fecal Occult Blood, POC: NEGATIVE

## 2015-12-22 MED ORDER — HYDROCORTISONE ACETATE 25 MG RE SUPP: 25.0000 mg | Freq: Two times a day (BID) | RECTAL | Status: DC

## 2015-12-22 NOTE — Addendum Note (Signed)
Addended by: [name] on: 12/22/2015 02:08 PM   Modules accepted: Orders

## 2015-12-22 NOTE — Patient Instructions (Signed)

## 2015-12-22 NOTE — Telephone Encounter (Signed)
Appointment

## 2015-12-22 NOTE — Progress Notes (Signed)
Name: Harold Smith   MRN: [id number]    DOB: 05-Feb-1966   Date:12/22/2015       Progress Note  Subjective  Chief Complaint  Chief Complaint  Patient presents with  . Hemorrhoids    patient stated he has never had hemorrhoids. patient's dad died from colon/rectal cancer.     HPI  Harold Smith is a 49 year old male here today regarding 2 months of possible hemorrhoids.  Has tried preparation H ointment and suppositories and they are not helping. He has also tried the cleaning cloth with witch hazel. Have noticed blood when wiping after BM. It burns and have began to develop sharp pains. Have also tried 800 mg of ibuprofen, left over percocet for inflammation. Has has C-scope on 05/26/2014 with colon polyps and diverticula disorder found at the time. Father has history of colon cancer. 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    Past Medical History  Diagnosis Date  . Allergic rhinitis due to pollen   . Osteoarthritis, knee   . Diverticulitis large intestine   . Colon polyps   . Sleep disturbance   . Obesity     Patient Active Problem List   Diagnosis Date Noted  . Preop examination 04/29/2014  . Hernia of anterior abdominal wall 11/12/2013  . BP (high blood pressure) 10/17/2013  . Morbid obesity ([place]) 10/17/2013  . Allergic rhinitis due to pollen   . Osteoarthritis, knee   . Diverticulitis large intestine   . Colon polyps   . Sleep disturbance   . Obesity     Social History  Substance Use Topics  . Smoking status: Never Smoker   . Smokeless tobacco: Never Used  . Alcohol Use: 0.0 oz/week    0 Standard drinks or equivalent per week     Current outpatient prescriptions:  .  Cyanocobalamin (VITAMIN B12 PO), Take by mouth., Disp: , Rfl:  .  multivitamin (ONE-A-DAY MEN'S) TABS tablet, Take 1 tablet by mouth daily., Disp: , Rfl:   No Known Allergies  Review of Systems  Positive for rectal irritation as mentioned in HPI,  otherwise all systems reviewed and are negative.  Objective  BP 118/72 mmHg  Pulse 101  Temp(Src) 98.5 F (36.9 C) (Oral)  Resp 18  Wt 213 lb 11.2 oz (96.934 kg)  SpO2 98%  Body mass index is 29.82 kg/(m^2).   Physical Exam  Constitutional: Patient appears well-developed and well-nourished. In no distress.  Cardiovascular: Normal rate, regular rhythm and normal heart sounds.  No murmur heard.  Pulmonary/Chest: Effort normal and breath sounds normal. No respiratory distress. Rectal exam: No prolapsed mass, no overt bleed, no fissure or fistula. On finger exam good sphincter tone, soft yellow-brown stool present, prostate smooth and soft.  Psychiatric: Patient has a normal mood and affect. Behavior is normal in office today. Judgment and thought content normal in office today.  Results for orders placed or performed in visit on 12/22/15 (from the past 24 hour(s))  POCT Occult Blood Stool     Status: Normal   Collection Time: 12/22/15  2:05 PM  Result Value Ref Range   Fecal Occult Blood, POC Negative Negative   Card #1 Date     Card #2 Fecal Occult Blod, POC     Card #2 Date     Card #3 Fecal Occult Blood, POC     Card #3 Date       Assessment & Plan  1. Hemorrhoids, unspecified hemorrhoid  type Recommended daily fiber, sitz bath, suppository and evaluation by Gen Surg as he has tried conservative therapy for nearly 2 months with no improvement in symptoms.  - hydrocortisone (ANUSOL-HC) 25 MG suppository; Place 1 suppository (25 mg total) rectally 2 (two) times daily.  Dispense: 24 suppository; Refill: 1 - Ambulatory referral to General Surgery  2. Diverticulosis large intestine w/o perforation or abscess w/bleeding Found on C-scope last year 2015. I do not appreciate infection process at this time.   3. Colon polyps Found on C-scope last year 2015.

## 2015-12-24 ENCOUNTER — Encounter: Payer: Self-pay | Admitting: General Surgery

## 2015-12-24 ENCOUNTER — Ambulatory Visit (INDEPENDENT_AMBULATORY_CARE_PROVIDER_SITE_OTHER): Payer: No Typology Code available for payment source | Admitting: General Surgery

## 2015-12-24 VITALS — BP 168/96 | HR 74 | Resp 16 | Ht 73.0 in | Wt 214.0 lb

## 2015-12-24 DIAGNOSIS — K648 Other hemorrhoids: Secondary | ICD-10-CM | POA: Diagnosis not present

## 2015-12-24 NOTE — Progress Notes (Signed)
Patient ID: Harold Smith, male   DOB: 03/11/1966, 49 y.o.   MRN: [id number]  Chief Complaint  Patient presents with  . Other    hemorrhoids    HPI Harold Smith is a 49 y.o. male.  Here today for evaluation of rectal pain and bleeding. He states couple months ago he had severe sharp pain in rectal area. Evaluated by his PCP that told him hemorrhoids. He states the bleeding comes and goes. He has not tried the suppositories as of yet. Preparation H helped some. 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 I have reviewed the history of present illness with the patient. HPI  Past Medical History  Diagnosis Date  . Allergic rhinitis due to pollen   . Osteoarthritis, knee   . Diverticulitis large intestine   . Colon polyps   . Sleep disturbance   . Obesity     Past Surgical History  Procedure Laterality Date  . Colon resection Left 2006    due to diverticular disease at [facility]  . Achilles tendon repair Right 2008  . Gastric bypass N/A 2009  . Hiatal hernia repair  10/15    at [facility]  . Colonoscopy  2013?  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 Hypertension Mother   . Diabetes Mother   . Kidney disease Mother   . Heart disease Mother   . Cancer Father 40    rectal cancer  . Asthma Sister   . Obesity Sister   . Obesity Brother   . Diabetes Other   . Heart disease Other   . Hypertension Other   . Kidney disease Other     Social History Social History  Substance Use Topics  . Smoking status: Never Smoker   . Smokeless tobacco: Never Used  . Alcohol Use: 0.0 oz/week    0 Standard drinks or equivalent per week    No Known Allergies  Current Outpatient Prescriptions  Medication Sig Dispense Refill  . Cyanocobalamin (VITAMIN B12 PO) Take by mouth.    . multivitamin (ONE-A-DAY MEN'S) TABS tablet Take 1 tablet by mouth daily.    . hydrocortisone (ANUSOL-HC) 25 MG suppository Place 1  suppository (25 mg total) rectally 2 (two) times daily. (Patient not taking: Reported on 12/24/2015) 24 suppository 1   No current facility-administered medications for this visit.    Review of Systems Review of Systems  Constitutional: Negative.   Respiratory: Negative.   Cardiovascular: Negative.   Gastrointestinal: Positive for diarrhea, blood in stool and rectal pain. Negative for constipation.    Blood pressure 168/96, pulse 74, resp. rate 16, height 6\' 1"  (1.854 m), weight 214 lb (97.07 kg).  Physical Exam Physical Exam  Constitutional: He is oriented to person, place, and time. He appears well-developed and well-nourished.  HENT:  Mouth/Throat: Oropharynx is clear and moist.  Eyes: Conjunctivae are normal. No scleral icterus.  Neck: Neck supple.  Cardiovascular: Normal rate, regular rhythm and normal heart sounds.   Pulmonary/Chest: Effort normal and breath sounds normal.  Abdominal: Soft. There is no tenderness.  Genitourinary: Rectal exam shows no external hemorrhoid, no internal hemorrhoid, no fissure and no tenderness.  Digital exam was normal. Anoscopy was performed and showed a prominent, mildly irritated internal hemorrhoid at 11 ocl location. There may be a smaller on at 6-7 ocl location.  Lymphadenopathy:    He has no  cervical adenopathy.  Neurological: He is alert and oriented to person, place, and time.  Skin: Skin is warm and dry.  Psychiatric: His behavior is normal.    Data Reviewed Progress notes.  Assessment    Internal hemorrhoid.    Plan    Plan trial of Anusol HC suppository-he already has this.Follow up in 3 weeks. If improved no further treatment required at present. If no response will discuss options of surgical managemnt The patient is aware to call back for any questions or concerns.      PCP:  Teddy Spike 12/24/2015, 2:15 PM

## 2016-01-13 ENCOUNTER — Ambulatory Visit: Payer: No Typology Code available for payment source | Admitting: General Surgery

## 2016-03-10 ENCOUNTER — Telehealth: Payer: Self-pay

## 2016-03-10 DIAGNOSIS — M170 Bilateral primary osteoarthritis of knee: Secondary | ICD-10-CM

## 2016-03-10 MED ORDER — IBUPROFEN 800 MG PO TABS: 800.0000 mg | ORAL_TABLET | Freq: Three times a day (TID) | ORAL | Status: DC | PRN

## 2016-03-10 NOTE — Telephone Encounter (Signed)
Requested RX sent to his [facility].

## 2016-03-10 NOTE — Telephone Encounter (Signed)
Patient called states the rx for the suppositories are to expensive that Dr. Jamal Collin gave him.  He wants to see about getting rx for Ibuprofen 800mg  for the pain of the hemorrhoids?

## 2016-03-16 ENCOUNTER — Other Ambulatory Visit: Payer: Self-pay | Admitting: Internal Medicine

## 2016-03-31 ENCOUNTER — Encounter: Payer: Self-pay | Admitting: *Deleted

## 2018-05-10 ENCOUNTER — Ambulatory Visit: Payer: No Typology Code available for payment source | Admitting: Family Medicine

## 2018-05-10 ENCOUNTER — Encounter: Payer: Self-pay | Admitting: Family Medicine

## 2018-05-10 VITALS — BP 122/82 | HR 76 | Temp 98.2°F | Resp 14 | Ht 73.0 in | Wt 200.9 lb

## 2018-05-10 DIAGNOSIS — Z1159 Encounter for screening for other viral diseases: Secondary | ICD-10-CM

## 2018-05-10 DIAGNOSIS — N5089 Other specified disorders of the male genital organs: Secondary | ICD-10-CM

## 2018-05-10 DIAGNOSIS — Z1211 Encounter for screening for malignant neoplasm of colon: Secondary | ICD-10-CM

## 2018-05-10 DIAGNOSIS — M170 Bilateral primary osteoarthritis of knee: Secondary | ICD-10-CM

## 2018-05-10 DIAGNOSIS — Z5181 Encounter for therapeutic drug level monitoring: Secondary | ICD-10-CM | POA: Diagnosis not present

## 2018-05-10 DIAGNOSIS — G4733 Obstructive sleep apnea (adult) (pediatric): Secondary | ICD-10-CM | POA: Diagnosis not present

## 2018-05-10 DIAGNOSIS — G4726 Circadian rhythm sleep disorder, shift work type: Secondary | ICD-10-CM | POA: Diagnosis not present

## 2018-05-10 DIAGNOSIS — G479 Sleep disorder, unspecified: Secondary | ICD-10-CM

## 2018-05-10 DIAGNOSIS — K5731 Diverticulosis of large intestine without perforation or abscess with bleeding: Secondary | ICD-10-CM | POA: Diagnosis not present

## 2018-05-10 DIAGNOSIS — K635 Polyp of colon: Secondary | ICD-10-CM

## 2018-05-10 DIAGNOSIS — Z13220 Encounter for screening for lipoid disorders: Secondary | ICD-10-CM

## 2018-05-10 DIAGNOSIS — Z114 Encounter for screening for human immunodeficiency virus [HIV]: Secondary | ICD-10-CM

## 2018-05-10 DIAGNOSIS — R634 Abnormal weight loss: Secondary | ICD-10-CM | POA: Diagnosis not present

## 2018-05-10 NOTE — Patient Instructions (Addendum)
Do not drive if you are tired or sleep-deprived We'll have you see the sleep specialist If you need something for aches or pains, try to use Tylenol (acetaminophen) first instead of non-steroidals (which include Aleve, ibuprofen, Advil, Motrin, and naproxen); non-steroidals can cause long-term kidney damage Once I see your kidney function, I'll know if it's okay to prescribe you ibuprofen; if you can, use the ibuprofen as sparingly as possible

## 2018-05-10 NOTE — Assessment & Plan Note (Signed)
Status post partial colectomy for this

## 2018-05-10 NOTE — Assessment & Plan Note (Signed)
Refer to GI for colonoscopy.

## 2018-05-10 NOTE — Assessment & Plan Note (Signed)
And other joints; will check Cr and then prescribe ibuprofen to be used sparingly; use tylenol first PRN per package directions

## 2018-05-10 NOTE — Progress Notes (Signed)
BP 122/82   Pulse 76   Temp 98.2 F (36.8 C) (Oral)   Resp 14   Ht 6\' 1"  (1.854 m)   Wt 200 lb 14.4 oz (91.1 kg)   SpO2 97%   BMI 26.51 kg/m    Subjective:    Patient ID: Harold Smith, male    DOB: 1966/03/25, 52 y.o.   MRN: [id number]  HPI: Harold Smith is a 52 y.o. male  Chief Complaint  Patient presents with  . Testicle Pain    was swollen and painful, but has since resolved since doing episalt and ice comprasses  . Insomnia    truck driver  . Referral    colonoscopy has not been 10 years yet but has strong family hx some rectal bleeding on accasion and hx polps.  . Medication Refill    ibuprofen 800mg  joint pain due to driving trucks  . Labs Only    work up    HPI Patient is new to me; previous provider left our practice  A few weeks ago, he had awful pain; the right testicle was the size of his whole scrotum; he went to the walk in clinic at [facility]; waited and then he went home, then they called him and said he needed to go to the ER so he never actually evaluated; he did some epsom salt baths; used ice compression; after 3 days it went away and no problems since; very tender to the touch and very enlarged; went away; did not turn colors; no restriction; no blood in urine or ejaculate; no lumps in the testicle; there is currently no swelling and no pain  He has insomnia; he has never been a good sleeper; used ativan and all the other sleep stuff years ago; he is a Administrator; he has not been to sleep since Tuesday; he gets an hour or two here or there; no decent sleep but has been functioning like that for years; he has tried melatonin; not kicking legs at night; not feeling restless before bed; he can get in the bed and sleep 3-4 hours and then rested until going to work at night; been like this for years; works graveyard shift  He has joint pain; he has had "a knee thing, I'm getting older, not a spring chicken any more"; his previous doctor  prescribed 800 mg ibuprofen; "I don't like to do a lot of narcotics"; both knees, both elbows, both shoulders; he has had inflammation before he says; not sure what kind of arthritis; I asked about family hx; his mother has a lot of joint pain, but not crippling; he says he doesn't have that; "just normal aches and pains"  He would like screening labs for cholesterol, etc  He had 25% of his colon removed for diverticular disease; had hernia repair; has lots of scarring on abdomen  Depression screen [facility] 2/9 05/10/2018 12/22/2015  Decreased Interest 0 0  Down, Depressed, Hopeless 0 0  PHQ - 2 Score 0 0    Relevant past medical, surgical, family and social history reviewed Past Medical History:  Diagnosis Date  . Allergic rhinitis due to pollen   . Colon polyps   . Diverticulitis large intestine   . Obesity   . Osteoarthritis, knee   . Sleep disturbance    Past Surgical History:  Procedure Laterality Date  . ACHILLES TENDON REPAIR Right 2008  . COLON RESECTION Left 2006   due to diverticular disease at [facility]  .  COLONOSCOPY  2013?  Marland Kitchen GASTRIC BYPASS N/A 2009  . HIATAL HERNIA REPAIR  10/15   at [facility]  . 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 Hypertension Mother   . Diabetes Mother   . Kidney disease Mother   . Heart disease Mother   . Cancer Mother   . Colon cancer Mother   . Cancer Father 76       rectal cancer  . Asthma Sister   . Obesity Sister   . Obesity Brother   . Diabetes Other   . Heart disease Other   . Hypertension Other   . Kidney disease Other    Social History   Tobacco Use  . Smoking status: Never Smoker  . Smokeless tobacco: Never Used  Substance Use Topics  . Alcohol use: Yes    Alcohol/week: 0.0 oz    Comment: occasional  . Drug use: No    Interim medical history since last visit reviewed. Allergies and medications reviewed  Review of Systems Per HPI unless specifically indicated  above     Objective:    BP 122/82   Pulse 76   Temp 98.2 F (36.8 C) (Oral)   Resp 14   Ht 6\' 1"  (1.854 m)   Wt 200 lb 14.4 oz (91.1 kg)   SpO2 97%   BMI 26.51 kg/m   Wt Readings from Last 3 Encounters:  05/10/18 200 lb 14.4 oz (91.1 kg)  12/24/15 214 lb (97.1 kg)  12/22/15 213 lb 11.2 oz (96.9 kg)    Physical Exam  Constitutional: He appears well-developed and well-nourished. No distress.  HENT:  Head: Normocephalic and atraumatic.  Eyes: EOM are normal. No scleral icterus.  Neck: No thyromegaly present.  Cardiovascular: Normal rate and regular rhythm.  Pulmonary/Chest: Effort normal and breath sounds normal.  Abdominal: Soft. Bowel sounds are normal. He exhibits no distension.    Surgical scars abdominal wall  Musculoskeletal: He exhibits no edema.  Neurological: Coordination normal.  Skin: Skin is warm and dry. No pallor.  Psychiatric: He has a normal mood and affect. His behavior is normal. Judgment and thought content normal. His mood appears not anxious. He does not exhibit a depressed mood.    Results for orders placed or performed in visit on 12/22/15  POCT Occult Blood Stool  Result Value Ref Range   Fecal Occult Blood, POC Negative Negative   Card #1 Date     Card #2 Fecal Occult Blod, POC     Card #2 Date     Card #3 Fecal Occult Blood, POC     Card #3 Date        Assessment & Plan:   Problem List Items Addressed This Visit      Digestive   Diverticulosis large intestine w/o perforation or abscess w/bleeding    Status post partial colectomy for this      Colon polyps    Refer to GI for colonoscopy        Musculoskeletal and Integument   Osteoarthritis, knee    And other joints; will check Cr and then prescribe ibuprofen to be used sparingly; use tylenol first PRN per package directions        Other   Sleep disturbance    Refer to sleep specialist       Other Visit Diagnoses    Testicular swelling, right    -  Primary   no  pain or  swelling now; I believe having him see urologist for thorough eval, possible [place] helpful in case he had testicular torsion that self-resolved   Relevant Orders   Ambulatory referral to Urology   Shift work sleep disorder       Relevant Orders   Ambulatory referral to Pulmonology   Screen for colon cancer       Relevant Orders   Ambulatory referral to Gastroenterology   Obstructive sleep apnea       Relevant Orders   Ambulatory referral to Pulmonology   Weight loss       hx of bariatric surgery; will check thyroid for thoroughness   Relevant Orders   COMPLETE METABOLIC PANEL WITH GFR   CBC with Differential/Platelet   TSH   Medication monitoring encounter       check renal function prior to prescribing NSAIDs   Relevant Orders   COMPLETE METABOLIC PANEL WITH GFR   CBC with Differential/Platelet   Screening for lipid disorders       Relevant Orders   Lipid panel   Screening for HIV (human immunodeficiency virus)       Relevant Orders   HIV antibody (with reflex)   Encounter for hepatitis C screening test for low risk patient       Relevant Orders   Hepatitis C Antibody       Follow up plan: Return in about 6 weeks (around 06/21/2018) for complete physical.  An after-visit summary was printed and given to the patient at [place].  Please see the patient instructions which may contain other information and recommendations beyond what is mentioned above in the assessment and plan.  No orders of the defined types were placed in this encounter.   Orders Placed This Encounter  Procedures  . Hepatitis C Antibody  . HIV antibody (with reflex)  . COMPLETE METABOLIC PANEL WITH GFR  . CBC with Differential/Platelet  . TSH  . Lipid panel  . Ambulatory referral to Gastroenterology  . Ambulatory referral to Urology  . Ambulatory referral to Pulmonology

## 2018-05-10 NOTE — Assessment & Plan Note (Signed)
-   Refer to sleep specialist  

## 2018-05-14 ENCOUNTER — Other Ambulatory Visit: Payer: Self-pay

## 2018-05-14 DIAGNOSIS — Z860100 Personal history of colon polyps, unspecified: Secondary | ICD-10-CM

## 2018-05-14 MED ORDER — NA SULFATE-K SULFATE-MG SULF 17.5-3.13-1.6 GM/177ML PO SOLN: 1.0000 | Freq: Once | ORAL | 0 refills | Status: AC

## 2018-05-17 ENCOUNTER — Ambulatory Visit: Payer: BLUE CROSS/BLUE SHIELD

## 2018-05-17 LAB — COMPLETE METABOLIC PANEL WITH GFR
AG Ratio: 1.6 (calc) (ref 1.0–2.5)
ALT: 40 U/L (ref 9–46)
AST: 31 U/L (ref 10–35)
Albumin: 4.4 g/dL (ref 3.6–5.1)
Alkaline phosphatase (APISO): 96 U/L (ref 40–115)
BUN: 19 mg/dL (ref 7–25)
CO2: 25 mmol/L (ref 20–32)
Calcium: 9.1 mg/dL (ref 8.6–10.3)
Chloride: 107 mmol/L (ref 98–110)
Creat: 0.87 mg/dL (ref 0.70–1.33)
GFR, Est African American: 113 mL/min/{1.73_m2} (ref >=60)
GFR, Est Non African American: 98 mL/min/{1.73_m2} (ref >=60)
Globulin: 2.8 g/dL (calc) (ref 1.9–3.7)
Glucose, Bld: 75 mg/dL (ref 65–99)
Potassium: 3.9 mmol/L (ref 3.5–5.3)
Sodium: 140 mmol/L (ref 135–146)
Total Bilirubin: 0.5 mg/dL (ref 0.2–1.2)
Total Protein: 7.2 g/dL (ref 6.1–8.1)

## 2018-05-17 LAB — CBC WITH DIFFERENTIAL/PLATELET
Basophils Absolute: 40 cells/uL (ref 0–200)
Basophils Relative: 0.6 %
Eosinophils Absolute: 47 cells/uL (ref 15–500)
Eosinophils Relative: 0.7 %
HCT: 40.7 % (ref 38.5–50.0)
Hemoglobin: 14 g/dL (ref 13.2–17.1)
Lymphs Abs: 1300 cells/uL (ref 850–3900)
MCH: 30.9 pg (ref 27.0–33.0)
MCHC: 34.4 g/dL (ref 32.0–36.0)
MCV: 89.8 fL (ref 80.0–100.0)
MPV: 10 fL (ref 7.5–12.5)
Monocytes Relative: 7.2 %
Neutro Abs: 4831 cells/uL (ref 1500–7800)
Neutrophils Relative %: 72.1 %
Platelets: 290 10*3/uL (ref 140–400)
RBC: 4.53 10*6/uL (ref 4.20–5.80)
RDW: 12.9 % (ref 11.0–15.0)
Total Lymphocyte: 19.4 %
WBC mixed population: 482 cells/uL (ref 200–950)
WBC: 6.7 10*3/uL (ref 3.8–10.8)

## 2018-05-17 LAB — LIPID PANEL
Cholesterol: 132 mg/dL (ref <200)
HDL: 68 mg/dL (ref >40)
LDL Cholesterol (Calc): 52 mg/dL (calc)
Non-HDL Cholesterol (Calc): 64 mg/dL (calc) (ref <130)
Total CHOL/HDL Ratio: 1.9 (calc) (ref <5.0)
Triglycerides: 43 mg/dL (ref <150)

## 2018-05-17 LAB — HEPATITIS C ANTIBODY
Hepatitis C Ab: NONREACTIVE
SIGNAL TO CUT-OFF: 0.07 (ref <1.00)

## 2018-05-17 LAB — HIV ANTIBODY (ROUTINE TESTING W REFLEX): HIV 1&2 Ab, 4th Generation: NONREACTIVE

## 2018-05-17 LAB — TSH: TSH: 1.32 mIU/L (ref 0.40–4.50)

## 2018-05-22 ENCOUNTER — Ambulatory Visit: Payer: BLUE CROSS/BLUE SHIELD | Admitting: Anesthesiology

## 2018-05-22 ENCOUNTER — Ambulatory Visit
Admission: RE | Admit: 2018-05-22 | Discharge: 2018-05-22 | Disposition: A | Discharge status: Home / Self Care | Payer: BLUE CROSS/BLUE SHIELD | Source: Ambulatory Visit | Attending: Gastroenterology | Admitting: Gastroenterology

## 2018-05-22 ENCOUNTER — Encounter: Payer: Self-pay | Admitting: *Deleted

## 2018-05-22 ENCOUNTER — Encounter
Admission: RE | Disposition: A | Discharge status: Home / Self Care | Payer: Self-pay | Source: Ambulatory Visit | Attending: Gastroenterology

## 2018-05-22 DIAGNOSIS — K648 Other hemorrhoids: Secondary | ICD-10-CM | POA: Insufficient documentation

## 2018-05-22 DIAGNOSIS — D123 Benign neoplasm of transverse colon: Secondary | ICD-10-CM | POA: Insufficient documentation

## 2018-05-22 DIAGNOSIS — Z9884 Bariatric surgery status: Secondary | ICD-10-CM | POA: Insufficient documentation

## 2018-05-22 DIAGNOSIS — K635 Polyp of colon: Secondary | ICD-10-CM | POA: Insufficient documentation

## 2018-05-22 DIAGNOSIS — Z800 Family history of malignant neoplasm of digestive organs: Secondary | ICD-10-CM | POA: Diagnosis not present

## 2018-05-22 DIAGNOSIS — D120 Benign neoplasm of cecum: Secondary | ICD-10-CM | POA: Diagnosis not present

## 2018-05-22 DIAGNOSIS — Z860100 Personal history of colon polyps, unspecified: Secondary | ICD-10-CM

## 2018-05-22 DIAGNOSIS — D124 Benign neoplasm of descending colon: Secondary | ICD-10-CM | POA: Insufficient documentation

## 2018-05-22 DIAGNOSIS — Z1211 Encounter for screening for malignant neoplasm of colon: Secondary | ICD-10-CM | POA: Diagnosis not present

## 2018-05-22 HISTORY — PX: COLONOSCOPY WITH PROPOFOL: SHX5780

## 2018-05-22 SURGERY — COLONOSCOPY WITH PROPOFOL
Anesthesia: General

## 2018-05-22 MED ORDER — PROPOFOL 10 MG/ML IV BOLUS
INTRAVENOUS | Status: DC | PRN
  Administered 2018-05-22: 90 mg via INTRAVENOUS

## 2018-05-22 MED ORDER — LIDOCAINE HCL (CARDIAC) PF 100 MG/5ML IV SOSY
PREFILLED_SYRINGE | INTRAVENOUS | Status: DC | PRN
  Administered 2018-05-22: 50 mg via INTRAVENOUS

## 2018-05-22 MED ORDER — SODIUM CHLORIDE 0.9 % IV SOLN
INTRAVENOUS | Status: DC
  Administered 2018-05-22: 1000 mL via INTRAVENOUS

## 2018-05-22 MED ORDER — PROPOFOL 500 MG/50ML IV EMUL
INTRAVENOUS | Status: DC | PRN
  Administered 2018-05-22: 140 ug/kg/min via INTRAVENOUS

## 2018-05-22 MED ORDER — PHENYLEPHRINE HCL 10 MG/ML IJ SOLN
INTRAMUSCULAR | Status: DC | PRN
  Administered 2018-05-22: 100 ug via INTRAVENOUS

## 2018-05-22 NOTE — Transfer of Care (Signed)
Immediate Anesthesia Transfer of Care Note  Patient: Harold Smith  Procedure(s) Performed: COLONOSCOPY WITH PROPOFOL (N/A )  Patient Location: PACU  Anesthesia Type:General  Level of Consciousness: sedated  Airway & Oxygen Therapy: Patient Spontanous Breathing and Patient connected to nasal cannula oxygen  Post-op Assessment: Report given to RN and Post -op Vital signs reviewed and stable  Post vital signs: Reviewed and stable  Last Vitals:  Vitals Value Taken Time  BP 92/62 05/22/2018  2:38 PM  Temp    Pulse 58 05/22/2018  2:38 PM  Resp 12 05/22/2018  2:38 PM  SpO2 100 % 05/22/2018  2:38 PM    Last Pain:  Vitals:   05/22/18 1245  TempSrc: Tympanic  PainSc: 0-No pain      Patients Stated Pain Goal: 0 (43/20/03 7944)  Complications: No apparent anesthesia complications

## 2018-05-22 NOTE — Op Note (Signed)
[facility] Gastroenterology Patient Name: Harold Smith Procedure Date: 05/22/2018 1:05 PM MRN: [id number] Account #: [id number] Date of Birth: 08-Jan-1966 Admit Type: Outpatient Age: 52 Room: [facility] ENDO ROOM 4 Gender: Male Note Status: Finalized Procedure:            Colonoscopy Indications:          Screening patient at increased risk: Colorectal cancer                        in multiple 1st-degree relatives age 69 years or older,                        High risk colon cancer surveillance: Personal history                        of colonic polyps Providers:            Lin Landsman MD, MD Referring MD:         Arnetha Courser (Referring MD) Medicines:            Monitored Anesthesia Care Complications:        No immediate complications. Estimated blood loss:                        Minimal. Procedure:            Pre-Anesthesia Assessment:                       - Prior to the procedure, a History and Physical was                        performed, and patient medications and allergies were                        reviewed. The patient is competent. The risks and                        benefits of the procedure and the sedation options and                        risks were discussed with the patient. All questions                        were answered and informed consent was obtained.                        Patient identification and proposed procedure were                        verified by the physician, the nurse, the                        anesthesiologist, the anesthetist and the technician in                        the pre-procedure area in the procedure room in the                        endoscopy suite. Mental Status Examination: alert and  oriented. Airway Examination: normal oropharyngeal                        airway and neck mobility. Respiratory Examination:                        clear to auscultation. CV Examination: normal.                       Prophylactic Antibiotics: The patient does not require                        prophylactic antibiotics. Prior Anticoagulants: The                        patient has taken no previous anticoagulant or                        antiplatelet agents. ASA Grade Assessment: II - A                        patient with mild systemic disease. After reviewing the                        risks and benefits, the patient was deemed in                        satisfactory condition to undergo the procedure. The                        anesthesia plan was to use monitored anesthesia care                        (MAC). Immediately prior to administration of                        medications, the patient was re-assessed for adequacy                        to receive sedatives. The heart rate, respiratory rate,                        oxygen saturations, blood pressure, adequacy of                        pulmonary ventilation, and response to care were                        monitored throughout the procedure. The physical status                        of the patient was re-assessed after the procedure.                       After obtaining informed consent, the colonoscope was                        passed under direct vision. Throughout the procedure,                        the patient's blood pressure,  pulse, and oxygen                        saturations were monitored continuously. The [place] (S#: [id number]) was introduced through                        the anus and advanced to the the cecum, identified by                        appendiceal orifice and ileocecal valve. The                        colonoscopy was performed with moderate difficulty due                        to inadequate bowel prep and significant looping.                        Successful completion of the procedure was aided by                        applying abdominal pressure and  lavage. The patient                        tolerated the procedure well. The quality of the bowel                        preparation was evaluated using the BBPS [facility] Bowel                        Preparation Scale) with scores of: Right Colon = 2                        (minor amount of residual staining, small fragments of                        stool and/or opaque liquid, but mucosa seen well),                        Transverse Colon = 2 (minor amount of residual                        staining, small fragments of stool and/or opaque                        liquid, but mucosa seen well) and Left Colon = 2 (minor                        amount of residual staining, small fragments of stool                        and/or opaque liquid, but mucosa seen well). The total                        BBPS score equals 6. The quality of the bowel  preparation was fair. Findings:      The perianal and digital rectal examinations were normal. Pertinent       negatives include normal sphincter tone and no palpable rectal lesions.      Ten sessile polyps were found in the descending colon, transverse colon       and cecum. The polyps were 5 to 8 mm in size. These polyps were removed       with cold snare and a hot snare. Resection and retrieval were complete.      Non-bleeding internal hemorrhoids were found during retroflexion. The       hemorrhoids were medium-sized. Impression:           - Preparation of the colon was fair.                       - Ten 5 to 8 mm polyps in the descending colon, in the                        transverse colon and in the cecum, removed with a hot                        snare. Resected and retrieved.                       - Non-bleeding internal hemorrhoids. Recommendation:       - Discharge patient to home.                       - Resume previous diet today.                       - Continue present medications.                       - Await pathology  results.                       - Repeat colonoscopy in 2 years or sooner for                        surveillance of multiple polyps. Procedure Code(s):    --- Professional ---                       [phone], Colonoscopy, flexible; with removal of tumor(s),                        polyp(s), or other lesion(s) by snare technique Diagnosis Code(s):    --- Professional ---                       Z80.0, Family history of malignant neoplasm of                        digestive organs                       Z86.010, Personal history of colonic polyps                       K64.8, Other hemorrhoids  D12.3, Benign neoplasm of transverse colon (hepatic                        flexure or splenic flexure)                       D12.4, Benign neoplasm of descending colon                       D12.0, Benign neoplasm of cecum CPT copyright 2017 American Medical Association. All rights reserved. The codes documented in this report are preliminary and upon coder review may  be revised to meet current compliance requirements. Dr. Ulyess Mort Lin Landsman MD, MD 05/22/2018 2:38:04 PM This report has been signed electronically. Number of Addenda: 0 Note Initiated On: 05/22/2018 1:05 PM Scope Withdrawal Time: 0 hours 38 minutes 21 seconds  Total Procedure Duration: 0 hours 42 minutes 19 seconds       [facility]

## 2018-05-22 NOTE — H&P (Signed)
[name], MD [address]  [place]  [place], [place] [address]  Main: [phone]  Fax: [phone] Pager: [phone]  Primary Care Physician:  [name], MD Primary Gastroenterologist:  Dr. [name]  Pre-Procedure History & Physical: HPI:  Harold Smith is a 52 y.o. male is here for an colonoscopy.   Past Medical History:  Diagnosis Date  . Allergic rhinitis due to pollen   . Colon polyps   . Diverticulitis large intestine   . Obesity   . Osteoarthritis, knee   . Sleep disturbance     Past Surgical History:  Procedure Laterality Date  . ACHILLES TENDON REPAIR Right 2008  . COLON RESECTION Left 2006   due to diverticular disease at [facility]  . COLONOSCOPY  2013?  Marland Kitchen GASTRIC BYPASS N/A 2009  . HIATAL HERNIA REPAIR  10/15   at [facility]  . 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 Authorizing Provider  Cyanocobalamin (VITAMIN B12 PO) Take by mouth.    [provider]  hydrocortisone (ANUSOL-HC) 25 MG suppository Place 1 suppository (25 mg total) rectally 2 (two) times daily. Patient not taking: Reported on 12/24/2015 12/22/15   [name], MD  ibuprofen (ADVIL,MOTRIN) 800 MG tablet Take 1 tablet (800 mg total) by mouth every 8 (eight) hours as needed. Patient not taking: Reported on 05/10/2018 03/10/16   [name], MD  multivitamin (ONE-A-DAY MEN'S) TABS tablet Take 1 tablet by mouth daily.    [provider]    Allergies as of 05/14/2018  . (No Known Allergies)    Family History  Problem Relation Age of Onset  . Hypertension Mother   . Diabetes Mother   . Kidney disease Mother   . Heart disease Mother   . Cancer Mother   . Colon cancer Mother   . Cancer Father 70       rectal cancer  . Asthma Sister   . Obesity Sister   . Obesity Brother   . Diabetes Other   . Heart disease Other   . Hypertension Other   . Kidney  disease Other     Social History   Socioeconomic History  . Marital status: Single    Spouse name: Not on file  . Number of children: 3  . Years of education: Not on file  . Highest education level: Not on file  Occupational History  . Occupation: Production designer, theatre/television/film  Social Needs  . Financial resource strain: Not on file  . Food insecurity:    Worry: Not on file    Inability: Not on file  . Transportation needs:    Medical: Not on file    Non-medical: Not on file  Tobacco Use  . Smoking status: Never Smoker  . Smokeless tobacco: Never Used  Substance and Sexual Activity  . Alcohol use: Yes    Alcohol/week: 0.0 oz    Comment: occasional  . Drug use: No  . Sexual activity: Yes  Lifestyle  . Physical activity:    Days per week: Not on file    Minutes per session: Not on file  . Stress: Not on file  Relationships  . Social connections:    Talks on phone: Not on file    Gets together: Not on file    Attends religious service: Not on file    Active member of club or organization: Not on  file    Attends meetings of clubs or organizations: Not on file    Relationship status: Not on file  . Intimate partner violence:    Fear of current or ex partner: Not on file    Emotionally abused: Not on file    Physically abused: Not on file    Forced sexual activity: Not on file  Other Topics Concern  . Not on file  Social History Narrative   Lives with 2 daughters from 35st marriage   Shared custody of another daughter from 9nd marriage    Review of Systems: See HPI, otherwise negative ROS  Physical Exam: Vitals:   05/22/18 1245 05/22/18 1438  BP: (!) 131/98 92/62  Pulse: 76 (!) 57  Resp: 18 13  Temp: 98.6 F (37 C) (!) 97 F (36.1 C)  SpO2: 97% 100%   General:   Alert,  pleasant and cooperative in NAD Head:  Normocephalic and atraumatic. Neck:  Supple; no masses or thyromegaly. Lungs:  Clear throughout to auscultation.    Heart:  Regular rate and  rhythm. Abdomen:  Soft, nontender and nondistended. Normal bowel sounds, without guarding, and without rebound.   Neurologic:  Alert and  oriented x4;  grossly normal neurologically.  Impression/Plan: [name] is here for an colonoscopy to be performed for personal h/o colon polyps and fam h/o colon cancer  Risks, benefits, limitations, and alternatives regarding  colonoscopy have been reviewed with the patient.  Questions have been answered.  All parties agreeable.   [name], MD  05/22/2018, 2:40 PM

## 2018-05-22 NOTE — Anesthesia Preprocedure Evaluation (Signed)
Anesthesia Evaluation  Patient identified by MRN, date of birth, ID band Patient awake    Reviewed: Allergy & Precautions, NPO status , Patient's Chart, lab work & pertinent test results, reviewed documented beta blocker date and time   Airway Mallampati: II  TM Distance: >3 FB     Dental  (+) Chipped   Pulmonary           Cardiovascular      Neuro/Psych    GI/Hepatic   Endo/Other    Renal/GU      Musculoskeletal  (+) Arthritis ,   Abdominal   Peds  Hematology   Anesthesia Other Findings Has had a g-bypass. In good shape now.  Reproductive/Obstetrics                             Anesthesia Physical Anesthesia Plan  ASA: II  Anesthesia Plan: General   Post-op Pain Management:    Induction: Intravenous  PONV Risk Score and Plan:   Airway Management Planned:   Additional Equipment:   Intra-op Plan:   Post-operative Plan:   Informed Consent: I have reviewed the patients History and Physical, chart, labs and discussed the procedure including the risks, benefits and alternatives for the proposed anesthesia with the patient or authorized representative who has indicated his/her understanding and acceptance.     Plan Discussed with: CRNA  Anesthesia Plan Comments:         Anesthesia Quick Evaluation

## 2018-05-22 NOTE — Anesthesia Procedure Notes (Signed)
Performed by: Jalexus Brett, CRNA Pre-anesthesia Checklist: Patient identified, Emergency Drugs available, Suction available, Patient being monitored and Timeout performed Patient Re-evaluated:Patient Re-evaluated prior to induction Oxygen Delivery Method: Nasal cannula Induction Type: IV induction       

## 2018-05-22 NOTE — Anesthesia Postprocedure Evaluation (Signed)
Anesthesia Post Note  Patient: Harold Smith  Procedure(s) Performed: COLONOSCOPY WITH PROPOFOL (N/A )  Patient location during evaluation: Endoscopy Anesthesia Type: General Level of consciousness: awake and alert Pain management: pain level controlled Vital Signs Assessment: post-procedure vital signs reviewed and stable Respiratory status: spontaneous breathing, nonlabored ventilation, respiratory function stable and patient connected to nasal cannula oxygen Cardiovascular status: blood pressure returned to baseline and stable Postop Assessment: no apparent nausea or vomiting Anesthetic complications: no     Last Vitals:  Vitals:   05/22/18 1448 05/22/18 1458  BP: 108/82 (!) 139/92  Pulse: 63 (!) 49  Resp: 14 10  Temp:    SpO2: 100% 100%    Last Pain:  Vitals:   05/22/18 1448  TempSrc:   PainSc: 0-No pain                 Jarom Govan S

## 2018-05-22 NOTE — Anesthesia Post-op Follow-up Note (Signed)
Anesthesia QCDR form completed.        

## 2018-05-23 ENCOUNTER — Ambulatory Visit (INDEPENDENT_AMBULATORY_CARE_PROVIDER_SITE_OTHER): Payer: BLUE CROSS/BLUE SHIELD | Admitting: Internal Medicine

## 2018-05-23 ENCOUNTER — Encounter: Payer: Self-pay | Admitting: Internal Medicine

## 2018-05-23 ENCOUNTER — Encounter: Payer: Self-pay | Admitting: Urology

## 2018-05-23 ENCOUNTER — Ambulatory Visit: Payer: BLUE CROSS/BLUE SHIELD | Admitting: Urology

## 2018-05-23 VITALS — BP 126/84 | HR 78 | Resp 16 | Ht 71.0 in | Wt 192.0 lb

## 2018-05-23 VITALS — BP 162/107 | HR 80 | Ht 73.0 in | Wt 190.0 lb

## 2018-05-23 DIAGNOSIS — M75120 Complete rotator cuff tear or rupture of unspecified shoulder, not specified as traumatic: Secondary | ICD-10-CM | POA: Insufficient documentation

## 2018-05-23 DIAGNOSIS — R0683 Snoring: Secondary | ICD-10-CM | POA: Diagnosis not present

## 2018-05-23 DIAGNOSIS — N50819 Testicular pain, unspecified: Secondary | ICD-10-CM | POA: Diagnosis not present

## 2018-05-23 DIAGNOSIS — M66369 Spontaneous rupture of flexor tendons, unspecified lower leg: Secondary | ICD-10-CM | POA: Insufficient documentation

## 2018-05-23 DIAGNOSIS — S20229A Contusion of unspecified back wall of thorax, initial encounter: Secondary | ICD-10-CM | POA: Insufficient documentation

## 2018-05-23 DIAGNOSIS — G4726 Circadian rhythm sleep disorder, shift work type: Secondary | ICD-10-CM

## 2018-05-23 DIAGNOSIS — G4719 Other hypersomnia: Secondary | ICD-10-CM

## 2018-05-23 LAB — URINALYSIS, COMPLETE
Bilirubin, UA: NEGATIVE
Glucose, UA: NEGATIVE
Ketones, UA: NEGATIVE
Leukocytes, UA: NEGATIVE
Nitrite, UA: NEGATIVE
Specific Gravity, UA: 1.025 (ref 1.005–1.030)
Urobilinogen, Ur: 0.2 mg/dL (ref 0.2–1.0)
pH, UA: 5 (ref 5.0–7.5)

## 2018-05-23 LAB — MICROSCOPIC EXAMINATION
Epithelial Cells (non renal): NONE SEEN /hpf (ref 0–10)
WBC, UA: NONE SEEN /hpf (ref 0–5)

## 2018-05-23 MED ORDER — ZOLPIDEM TARTRATE ER 12.5 MG PO TBCR: 12.5000 mg | EXTENDED_RELEASE_TABLET | Freq: Every evening | ORAL | 0 refills | Status: DC | PRN

## 2018-05-23 NOTE — Patient Instructions (Addendum)
Try to make a regular sleep schedule, go to bed at the same time. Will start ambien. Will send for sleep study.   Improving daytime sleep - Shift workers who are experiencing sleep-wake disturbances of any level of severity can benefit from interventions aimed at improving daytime sleep. The initial approach involves review of environmental and scheduling factors to promote better sleep (ie, sleep hygiene). For patients with persistent difficulties obtaining adequate sleep, options include use of a short-acting hypnotic agent, exogenous melatonin, and behavioral treatment of insomnia. The choice among these depends on availability and cost, presence of contraindications, and patient preference.  Sleep hygiene - The sleep environment should be modified to promote consolidated daytime sleep, with particular attention to light, temperature, and noise level.  Light-blocking shades should be used to reduce as much sunlight as possible, with cool ambient temperature (generally between 60 to 50F). If ambient noise cannot be controlled, a white noise machine may help reduce arousals that are due to variability in environmental noise.  Improving wakefulness during the night shift - Optimizing daytime sleep does not typically eliminate sleepiness during the night shift [64,65]. For patients who are symptomatic despite adequate or optimized daytime sleep, options include short naps before or during the shift, caffeine, or use of a wake-promoting agent such as modafinil or armodafinil.   Circadian re-alignment strategies - Specifically timed exposure to bright light (2000 to 10,000 lux at the cornea) has the potential to lessen the mismatch between an individual's internal circadian phase and the desired or environmental schedule, and limited data indicate that such interventions may be useful in shift workers to improve alertness.  Light exposure - Bright light exposure is effective at various doses, ranging  from as low as four 20-minute periods during breaks to as much exposure during the shift as possible [51]. Light may serve to improve alertness and shift circadian rhythms as indexed by the timing of body temperature minimum or melatonin onset. When used appropriately, each hour of exposure to bright light should incur a 30-minute shift in the biological clock.  Light-blocking glasses - Light-blocking glasses in the morning (approximately 6 to 11 am) can also be helpful, especially when shift workers are exposed to natural sunlight before their scheduled sleep time. Similarly, light-blocking shades should be installed in the sleeping environment to prevent exposure to sunlight during daytime sleep. (See 'Sleep hygiene' above.)

## 2018-05-23 NOTE — Progress Notes (Signed)
05/23/2018 9:01 AM   Harold Smith [birth year] [id number]  Referring provider: [name], [place],  [id number]  Chief Complaint  Patient presents with  . Groin Swelling    New Patient    HPI: 52 year old male who presents today for further evaluation of scrotal pain.  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  The pain was severe, constant, and lasted for approximately 3 days.  He denied any trauma or personal history of scrotal pain/infection in the past.  He went to urgent care for evaluation but the weight was too long.  He never had any scrotal imaging.  He self treated with Epson salt bath, supportive undergarments, and ice.  The pain eventually completely subsided.  He denied any associated urinary symptoms including dysuria, hematuria, or any other urinary complaints.  No penile discharge.  He is not currently sexually active and no risk for STIs.  He does bring a picture with him today of scrotum at the time.  His right testicle does appear to have a slightly unusual lie although in the photograph, his scrotum is resting on a table.  Today, he has absolutely no symptoms.  His scrotum is normal without pain, discomfort, or swelling.   PMH: Past Medical History:  Diagnosis Date  . Allergic rhinitis due to pollen   . Colon polyps   . Diverticulitis large intestine   . Obesity   . Osteoarthritis, knee   . Sleep disturbance     Surgical History: Past Surgical History:  Procedure Laterality Date  . ACHILLES TENDON REPAIR Right 2008  . COLON RESECTION Left 2006   due to diverticular disease at [facility]  . COLONOSCOPY  2013?  Marland Kitchen GASTRIC BYPASS N/A 2009  . HIATAL HERNIA REPAIR  10/15   at [facility]  . 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 Always use your most recent med list.          multivitamin Tabs tablet Take 1 tablet by mouth daily.   VITAMIN B12 PO Take by mouth.       Allergies: No Known Allergies  Family History: Family History  Problem Relation Age of Onset  . Hypertension Mother   . Diabetes Mother   . Kidney disease Mother   . Heart disease Mother   . Cancer Mother   . Colon cancer Mother   . Cancer Father 44       rectal cancer  . Asthma Sister   . Obesity Sister   . Obesity Brother   . Diabetes Other   . Heart disease Other   . Hypertension Other   . Kidney disease Other     Social History:  reports that he has never smoked. He has never used smokeless tobacco. He reports that he drinks alcohol. He reports that he does not use drugs.  ROS: UROLOGY Frequent Urination?: No Hard to postpone urination?: No Burning/pain with urination?: No Get up at night to urinate?: Yes Leakage of urine?: No Urine stream starts and stops?: No Trouble starting stream?: No Do you have to strain to urinate?: No Blood in urine?: No Urinary tract infection?: No Sexually transmitted disease?: No Injury to kidneys or bladder?: No Painful  intercourse?: No Weak stream?: No Erection problems?: No Penile pain?: No  Gastrointestinal Nausea?: No Vomiting?: No Indigestion/heartburn?: No Diarrhea?: No Constipation?: No  Constitutional Fever: No Night sweats?: No Weight loss?: No Fatigue?: No  Skin Skin rash/lesions?: No Itching?: No  Eyes Blurred vision?: No Double vision?: No  Ears/Nose/Throat Sore throat?: No Sinus problems?: No  Hematologic/Lymphatic Swollen glands?: No Easy bruising?: No  Cardiovascular Leg swelling?: No Chest pain?: No  Respiratory Cough?: No Shortness of breath?: No  Endocrine Excessive thirst?: No  Musculoskeletal Back pain?: No Joint pain?: No  Neurological Headaches?: No Dizziness?: No  Psychologic Depression?: No Anxiety?:  No  Physical Exam: BP (!) 162/107   Pulse 80   Ht 6\' 1"  (1.854 m)   Wt 190 lb (86.2 kg)   BMI 25.07 kg/m   Constitutional:  Alert and oriented, No acute distress. HEENT: [place] AT, moist mucus membranes.  Trachea midline, no masses. Cardiovascular: No clubbing, cyanosis, or edema. Respiratory: Normal respiratory effort, no increased work of breathing. GI: Abdomen is soft, nontender, nondistended, no abdominal masses.  No palpable inguinal hernias. GU: Normal circumcised phallus, orthotopic meatus without discharge.  Bilateral descended testicles, without masses, tenderness, or overlying skin changes.  Normal GU exam. Skin: No rashes, bruises or suspicious lesions. Neurologic: Grossly intact, no focal deficits, moving all 4 extremities. Psychiatric: Normal mood and affect.  Laboratory Data: Lab Results  Component Value Date   WBC 6.7 05/10/2018   HGB 14.0 05/10/2018   HCT 40.7 05/10/2018   MCV 89.8 05/10/2018   PLT 290 05/10/2018    Lab Results  Component Value Date   CREATININE 0.87 05/10/2018   Urinalysis Urine dip today with 1+ blood, trace protein.  On microscopic exam, no red blood cells identified.  He was otherwise unremarkable.  See epic.  Pertinent Imaging: N/a  Assessment & Plan:    1. Testicular pain 3-day history of right testicular pain and swelling, most likely chemical/inflammatory epididymitis which resolved spontaneously with supportive care.  History is somewhat concerning for intermittent torsion, however given his age and no history of previous episodes, this would be somewhat unusual.  We discussed the differential diagnosis at length today.  In the future, if he has severe acute onset pain, ultrasound at the time of pain would help to differentiate this.  He will call us and let us know ASAP if he experiences the same pain during office hours and we can arrange for STAT scrotal ultrasound.  If it occurs on nights and weekends, he will need to be seen  in the emergency room.  We briefly discussed the option of prophylactic orchidopexy, however, given that this is very low on the differential diagnosis is never occurred in the past, will hold off on pursuing this.  He is agreeable this plan.  - Urinalysis, Complete  F/u prn  Hollice Espy, MD  [facility] [address] [place], [place] [address] [phone]

## 2018-05-23 NOTE — Progress Notes (Signed)
[facility] Consultation      Assessment and Plan:  Sleep maintenance type insomnia. - Patient has trouble staying asleep, with multiple awakenings about the night.  Suspect this is likely secondary to circadian rhythm/shift workers sleep disorder. - We will start Ambien CR 12.5 mg to help him stay asleep.  Circadian rhythm disorder, shift workers sleep disorder. -Discussed aspects of sleep hygiene, trying to go to bed at the same time on his work days. - Discussed trying to limit light exposure in the evening. - Discussed that if sleep study negative, can consider dose of a stimulant during waking hours such as modafinil.  However patient tells me that he would not find this necessary at this time.   Obstructive sleep apnea. - Previous diagnosis of sleep apnea before his bariatric surgery and weight loss. - Discussed the sleep apnea could contribute to his episodes of waking up.  We will do a sleep study to rule out sleep apnea as a contributor to this.    Date: 05/23/2018  MRN# [id number] Harold Smith Nov 21, 1966   Harold Smith is a 52 y.o. old male seen in consultation for chief complaint of:    Chief Complaint  Patient presents with  . Consult    Referred by Dr. Sanda Klein for eval of OSA.  Marland Kitchen Insomnia    pt says he is up 12-18 hrs daily due to not being able to sleep.    HPI:  He has been having trouble with his sleep for as long as he can remember. He is a Administrator, he drives the graveyard shift.  Wakes between 11 and MN, works as Administrator by around noon to 3 pm. He gets home at variable times usually between 3 and 5. He goes to bed at 5 pm usually falls asleep within an hour, usually asleep by 6 pm. He might often wakes several times during the sleep, he will be up for a few minutes, have a sip of water, go back to sleep without difficulty. He has tried ativan and he slept 5 hour straight.  He works 5 week days.  He has blinds but not  curtain in his bedroom, he has tried melatonin, has taken it for 3 days and took 4 or 5 at one time.  On days off he tries to stay up and sleeps overnight.  He denies jaw pain, or TMJ.   He had a sleep apnea, then had a bariatric surgery, then stopped CPAP.    PMHX:   Past Medical History:  Diagnosis Date  . Allergic rhinitis due to pollen   . Colon polyps   . Diverticulitis large intestine   . Obesity   . Osteoarthritis, knee   . Sleep disturbance    Surgical Hx:  Past Surgical History:  Procedure Laterality Date  . ACHILLES TENDON REPAIR Right 2008  . COLON RESECTION Left 2006   due to diverticular disease at [facility]  . COLONOSCOPY  2013?  Marland Kitchen GASTRIC BYPASS N/A 2009  . HIATAL HERNIA REPAIR  10/15   at [facility]  . 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 Hypertension Mother   . Diabetes Mother   . Kidney disease Mother   . Heart disease Mother   . Cancer Mother   . Colon cancer Mother   . Cancer Father 29       rectal cancer  .  Asthma Sister   . Obesity Sister   . Obesity Brother   . Diabetes Other   . Heart disease Other   . Hypertension Other   . Kidney disease Other    Social Hx:   Social History   Tobacco Use  . Smoking status: Never Smoker  . Smokeless tobacco: Never Used  Substance Use Topics  . Alcohol use: Yes    Alcohol/week: 0.0 oz    Comment: occasional  . Drug use: No   Medication:    Current Outpatient Medications:  .  Cyanocobalamin (VITAMIN B12 PO), Take by mouth., Disp: , Rfl:  .  multivitamin (ONE-A-DAY MEN'S) TABS tablet, Take 1 tablet by mouth daily., Disp: , Rfl:    Allergies:  Patient has no known allergies.  Review of Systems: Gen:  Denies  fever, sweats, chills HEENT: Denies blurred vision, double vision. bleeds, sore throat Cvc:  No dizziness, chest pain. Resp:   Denies cough or sputum production, shortness of breath Gi: Denies swallowing difficulty, stomach  pain. Gu:  Denies bladder incontinence, burning urine Ext:   No Joint pain, stiffness. Skin: No skin rash,  hives  Endoc:  No polyuria, polydipsia. Psych: No depression, insomnia. Other:  All other systems were reviewed with the patient and were negative other that what is mentioned in the HPI.   Physical Examination:   VS: BP 126/84 (BP Location: Left Arm, Cuff Size: Normal)   Pulse 78   Resp 16   Ht 5\' 11"  (1.803 m)   Wt 192 lb (87.1 kg)   SpO2 100%   BMI 26.78 kg/m   General Appearance: No distress  Neuro:without focal findings,  speech normal,  HEENT: PERRLA, EOM intact.  Mallampati 3 Pulmonary: normal breath sounds, No wheezing.  CardiovascularNormal S1,S2.  No m/r/g.   Abdomen: Benign, Soft, non-tender. Renal:  No costovertebral tenderness  GU:  No performed at this time. Endoc: No evident thyromegaly, no signs of acromegaly. Skin:   warm, no rashes, no ecchymosis  Extremities: normal, no cyanosis, clubbing.  Other findings:    LABORATORY PANEL:   CBC No results for input(s): WBC, HGB, HCT, PLT in the last 168 hours. ------------------------------------------------------------------------------------------------------------------  Chemistries  No results for input(s): NA, K, CL, CO2, GLUCOSE, BUN, CREATININE, CALCIUM, MG, AST, ALT, ALKPHOS, BILITOT in the last 168 hours.  Invalid input(s): GFRCGP ------------------------------------------------------------------------------------------------------------------  Cardiac Enzymes No results for input(s): TROPONINI in the last 168 hours. ------------------------------------------------------------  RADIOLOGY:  No results found.     Thank  you for the consultation and for allowing [facility] to assist in the care of your patient. Our recommendations are noted above.  Please contact us if we can be of further service.   Marda Stalker, MD.  Board Certified in Internal Medicine,  Pulmonary Medicine, [place], and Sleep Medicine.  [facility] Office Number: [phone]  Patricia Pesa, M.D.  Merton Border, M.D  05/23/2018

## 2018-05-24 ENCOUNTER — Encounter: Payer: Self-pay | Admitting: Gastroenterology

## 2018-05-24 LAB — SURGICAL PATHOLOGY

## 2018-06-05 ENCOUNTER — Other Ambulatory Visit: Payer: Self-pay | Admitting: *Deleted

## 2018-06-05 DIAGNOSIS — R0683 Snoring: Secondary | ICD-10-CM

## 2018-06-05 DIAGNOSIS — G4733 Obstructive sleep apnea (adult) (pediatric): Secondary | ICD-10-CM

## 2018-06-05 DIAGNOSIS — G4719 Other hypersomnia: Secondary | ICD-10-CM

## 2018-06-05 NOTE — Addendum Note (Signed)
Addended by: Stephanie Coup on: 06/05/2018 04:46 PM   Modules accepted: Orders

## 2018-06-05 NOTE — Progress Notes (Signed)
He has a history of sleep apnea and snoring, these are respiratory diagnoses and can use them on the order instead of EDS.   Previous Messages    ----- Message -----  From: Catha Gosselin  Sent: 06/05/2018 12:13 PM  To: Laverle Hobby, MD  Subject: Respiratory Diagnosis Code needed on order f*   In order for [place] to cover HST a respiratory diagnosis needs to be on order.   Please advise and have [facility] place new order.   Thanks,  Suanne Marker

## 2018-06-18 ENCOUNTER — Encounter: Payer: Self-pay | Admitting: Internal Medicine

## 2018-06-18 DIAGNOSIS — R0683 Snoring: Secondary | ICD-10-CM

## 2018-06-18 DIAGNOSIS — G4733 Obstructive sleep apnea (adult) (pediatric): Secondary | ICD-10-CM | POA: Diagnosis not present

## 2018-06-22 ENCOUNTER — Ambulatory Visit (INDEPENDENT_AMBULATORY_CARE_PROVIDER_SITE_OTHER): Payer: BLUE CROSS/BLUE SHIELD | Admitting: Family Medicine

## 2018-06-22 ENCOUNTER — Telehealth: Payer: Self-pay | Admitting: *Deleted

## 2018-06-22 ENCOUNTER — Encounter: Payer: Self-pay | Admitting: Family Medicine

## 2018-06-22 VITALS — BP 134/82 | HR 79 | Temp 98.2°F | Resp 12 | Ht 71.0 in | Wt 196.5 lb

## 2018-06-22 DIAGNOSIS — Z9884 Bariatric surgery status: Secondary | ICD-10-CM | POA: Diagnosis not present

## 2018-06-22 DIAGNOSIS — N529 Male erectile dysfunction, unspecified: Secondary | ICD-10-CM

## 2018-06-22 DIAGNOSIS — M170 Bilateral primary osteoarthritis of knee: Secondary | ICD-10-CM

## 2018-06-22 DIAGNOSIS — Z23 Encounter for immunization: Secondary | ICD-10-CM

## 2018-06-22 DIAGNOSIS — Z0001 Encounter for general adult medical examination with abnormal findings: Secondary | ICD-10-CM

## 2018-06-22 DIAGNOSIS — G4733 Obstructive sleep apnea (adult) (pediatric): Secondary | ICD-10-CM | POA: Diagnosis not present

## 2018-06-22 DIAGNOSIS — Z0000 Encounter for general adult medical examination without abnormal findings: Secondary | ICD-10-CM

## 2018-06-22 MED ORDER — SILDENAFIL CITRATE 100 MG PO TABS: 25.0000 mg | ORAL_TABLET | Freq: Every day | ORAL | 5 refills | Status: DC | PRN

## 2018-06-22 NOTE — Progress Notes (Signed)
Patient ID: Harold Smith, male   DOB: 03/09/1966, 52 y.o.   MRN: [id number]   Subjective:   Harold Smith is a 52 y.o. male here for a complete physical exam  Interim issues since last visit: no excitement; issues with ED; drive is there, libido is fine; he asked for ibuprofen 800 mg and he would take two at a time and that would help arthritis; he did not want to go to a pain clinic; that's the closest thing I have besides narcotics  USPSTF grade A and B recommendations Depression:  Depression screen [facility] 2/9 06/22/2018 05/10/2018 12/22/2015  Decreased Interest 0 0 0  Down, Depressed, Hopeless 0 0 0  PHQ - 2 Score 0 0 0   Hypertension: BP Readings from Last 3 Encounters:  06/22/18 134/82  05/23/18 126/84  05/23/18 (!) 162/107   Obesity: going up steadily going up; weighed 185 pounds this morning before leaving the house; 197 pounds upon check-in; reweigh without shoes Wt Readings from Last 3 Encounters:  06/22/18 196 lb 8 oz (89.1 kg)  05/23/18 192 lb (87.1 kg)  05/23/18 190 lb (86.2 kg)   BMI Readings from Last 3 Encounters:  06/22/18 27.41 kg/m  05/23/18 26.78 kg/m  05/23/18 25.07 kg/m    Immunizations: tetanus; discussed shingles Skin cancer: nothing worrisome Lung cancer:  Never smoker Prostate cancer: start at age 22; father had colorectal cancer, mother has cancer No results found for: PSA Colorectal cancer: colonoscopy just done on 05/22/18 AAA: n/a Aspirin: not taking; do NOT advise Diet: good eater Exercise: active Alcohol: rare Tobacco use: no HIV, hep B, hep C: both negative STD testing and prevention (chl/gon/syphilis): okay Glucose:  Glucose  Date Value Ref Range Status  02/18/2014 89 65 - 99 mg/dL Final   Glucose, Bld  Date Value Ref Range Status  05/10/2018 75 65 - 99 mg/dL Final    Comment:    .            Fasting reference interval .   05/29/2013 89 70 - 99 mg/dL Final   Lipids:  Lab Results  Component Value Date   CHOL 132  05/10/2018   Lab Results  Component Value Date   HDL 68 05/10/2018   Lab Results  Component Value Date   LDLCALC 52 05/10/2018   Lab Results  Component Value Date   TRIG 43 05/10/2018   Lab Results  Component Value Date   CHOLHDL 1.9 05/10/2018   No results found for: LDLDIRECT   Past Medical History:  Diagnosis Date  . Allergic rhinitis due to pollen   . Colon polyps   . Diverticulitis large intestine   . Obesity   . Osteoarthritis, knee   . Sleep disturbance    Past Surgical History:  Procedure Laterality Date  . ACHILLES TENDON REPAIR Right 2008  . COLON RESECTION Left 2006   due to diverticular disease at [facility]  . COLONOSCOPY  2013?  . COLONOSCOPY WITH PROPOFOL N/A 05/22/2018   Procedure: COLONOSCOPY WITH PROPOFOL;  Surgeon: Lin Landsman, MD;  Location[facility] ENDOSCOPY;  Service: Gastroenterology;  Laterality: N/A;  . GASTRIC BYPASS N/A 2009  . HIATAL HERNIA REPAIR  10/15   at [facility]  . 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 Hypertension Mother   . Diabetes Mother   . Kidney disease Mother   . Heart disease Mother   . Cancer Mother   .  Colon cancer Mother   . Cancer Father 31       rectal cancer  . Asthma Sister   . Obesity Sister   . Obesity Brother   . Diabetes Other   . Heart disease Other   . Hypertension Other   . Kidney disease Other    Social History   Tobacco Use  . Smoking status: Never Smoker  . Smokeless tobacco: Never Used  Substance Use Topics  . Alcohol use: Yes    Alcohol/week: 0.0 oz    Comment: occasional  . Drug use: No   Review of Systems  Constitutional: Negative for unexpected weight change (up and down).  HENT: Negative for hearing loss.   Eyes: Negative for visual disturbance.  Respiratory: Negative for wheezing.   Cardiovascular: Negative for chest pain.  Gastrointestinal: Positive for blood in stool (probably hemohrroids, just had colonoscopy).   Endocrine: Negative for cold intolerance, heat intolerance and polydipsia.  Genitourinary: Negative for hematuria.  Skin:       No worrisome skin cancers  Allergic/Immunologic: Negative for food allergies.  Neurological: Negative for tremors.    Objective:   Vitals:   06/22/18 1548  BP: 134/82  Pulse: 79  Resp: 12  Temp: 98.2 F (36.8 C)  TempSrc: Oral  SpO2: 96%  Weight: 196 lb 8 oz (89.1 kg)  Height: 5\' 11"  (1.803 m)   Body mass index is 27.41 kg/m. Wt Readings from Last 3 Encounters:  06/22/18 196 lb 8 oz (89.1 kg)  05/23/18 192 lb (87.1 kg)  05/23/18 190 lb (86.2 kg)   Physical Exam  Constitutional: He appears well-developed and well-nourished. No distress.  HENT:  Head: Normocephalic and atraumatic.  Nose: Nose normal.  Mouth/Throat: Oropharynx is clear and moist.  Eyes: EOM are normal. No scleral icterus.  Neck: No JVD present. No thyromegaly present.  Cardiovascular: Normal rate, regular rhythm and normal heart sounds.  Pulmonary/Chest: Effort normal and breath sounds normal. No respiratory distress. He has no wheezes. He has no rales.  Abdominal: Soft. Bowel sounds are normal. He exhibits no distension. There is no tenderness. There is no guarding.  Musculoskeletal: Normal range of motion. He exhibits no edema.  Lymphadenopathy:    He has no cervical adenopathy.  Neurological: He is alert. He displays normal reflexes. He exhibits normal muscle tone. Coordination normal.  Skin: Skin is warm and dry. No rash noted. He is not diaphoretic. No erythema. No pallor.  Psychiatric: He has a normal mood and affect. His behavior is normal. Judgment and thought content normal.    Assessment/Plan:   Problem List Items Addressed This Visit      Musculoskeletal and Integument   Osteoarthritis, knee    Explained that I will not prescribe any NSAIDs for him; he should not take them b/c of hx of gastric bypass; tylenol safest option; he declined offer to specialist (such  as pain clinic) for his pain        Genitourinary   Erectile dysfunction    Discussed treatment option, PDE-5 inhibitor; how best taken, Rx provided        Other   Preventative health care - Primary    USPSTF grade A and B recommendations reviewed with patient; age-appropriate recommendations, preventive care, screening tests, etc discussed and encouraged; healthy living encouraged; see AVS for patient education given to patient       Personal hx of gastric bypass    I explained that he should NOT take NSAIDs, could result in an  ulcer, GI bleed; tylenol safest option; offered pain clinic referral for other options; he declined       Other Visit Diagnoses    Need for shingles vaccine       explained Shingrix; vaccine today then return in 2-6 months for booster ..... I believe the vaccine was given today, but admin documentation lacking      Meds ordered this encounter  Medications  . sildenafil (VIAGRA) 100 MG tablet    Sig: Take 0.5 tablets (50 mg total) by mouth daily as needed for erectile dysfunction.    Dispense:  5 tablet    Refill:  5   No orders of the defined types were placed in this encounter.   Follow up plan: Return in about 1 year (around 06/23/2019) for complete physical.  An After Visit Summary was printed and given to the patient.

## 2018-06-22 NOTE — Telephone Encounter (Signed)
Pt aware of results of HST. Orders placed Nothing further needed. 

## 2018-06-22 NOTE — Patient Instructions (Addendum)
You can use tylenol per package directions Try turmeric as a natural anti-inflammatory (for pain and arthritis). It comes in capsules where you buy aspirin and fish oil, but also as a spice where you buy pepper and garlic powder. If you need something for aches or pains, try to use Tylenol (acetaminophen) instead of non-steroidals (which include Aleve, ibuprofen, Advil, Motrin, and naproxen); non-steroidals can cause long-term kidney damage and gastrointestinal bleeding and ulcers   Health Maintenance, Male A healthy lifestyle and preventive care is important for your health and wellness. Ask your health care provider about what schedule of regular examinations is right for you. What should I know about weight and diet? Eat a Healthy Diet  Eat plenty of vegetables, fruits, whole grains, low-fat dairy products, and lean protein.  Do not eat a lot of foods high in solid fats, added sugars, or salt.  Maintain a Healthy Weight Regular exercise can help you achieve or maintain a healthy weight. You should:  Do at least 150 minutes of exercise each week. The exercise should increase your heart rate and make you sweat (moderate-intensity exercise).  Do strength-training exercises at least twice a week.  Watch Your Levels of Cholesterol and Blood Lipids  Have your blood tested for lipids and cholesterol every 5 years starting at 54 years of age. If you are at high risk for heart disease, you should start having your blood tested when you are 54 years old. You may need to have your cholesterol levels checked more often if: ? Your lipid or cholesterol levels are high. ? You are older than 54 years of age. ? You are at high risk for heart disease.  What should I know about cancer screening? Many types of cancers can be detected early and may often be prevented. Lung Cancer  You should be screened every year for lung cancer if: ? You are a current smoker who has smoked for at least 30  years. ? You are a former smoker who has quit within the past 15 years.  Talk to your health care provider about your screening options, when you should start screening, and how often you should be screened.  Colorectal Cancer  Routine colorectal cancer screening usually begins at 54 years of age and should be repeated every 5-10 years until you are 54 years old. You may need to be screened more often if early forms of precancerous polyps or small growths are found. Your health care provider may recommend screening at an earlier age if you have risk factors for colon cancer.  Your health care provider may recommend using home test kits to check for hidden blood in the stool.  A small camera at the end of a tube can be used to examine your colon (sigmoidoscopy or colonoscopy). This checks for the earliest forms of colorectal cancer.  Prostate and Testicular Cancer  Depending on your age and overall health, your health care provider may do certain tests to screen for prostate and testicular cancer.  Talk to your health care provider about any symptoms or concerns you have about testicular or prostate cancer.  Skin Cancer  Check your skin from head to toe regularly.  Tell your health care provider about any new moles or changes in moles, especially if: ? There is a change in a mole's size, shape, or color. ? You have a mole that is larger than a pencil eraser.  Always use sunscreen. Apply sunscreen liberally and repeat throughout the day.  Protect  yourself by wearing long sleeves, pants, a wide-brimmed hat, and sunglasses when outside.  What should I know about heart disease, diabetes, and high blood pressure?  If you are 49-26 years of age, have your blood pressure checked every 3-5 years. If you are 45 years of age or older, have your blood pressure checked every year. You should have your blood pressure measured twice-once when you are at a hospital or clinic, and once when you are  not at a hospital or clinic. Record the average of the two measurements. To check your blood pressure when you are not at a hospital or clinic, you can use: ? An automated blood pressure machine at a pharmacy. ? A home blood pressure monitor.  Talk to your health care provider about your target blood pressure.  If you are between 7-12 years old, ask your health care provider if you should take aspirin to prevent heart disease.  Have regular diabetes screenings by checking your fasting blood sugar level. ? If you are at a normal weight and have a low risk for diabetes, have this test once every three years after the age of 11. ? If you are overweight and have a high risk for diabetes, consider being tested at a younger age or more often.  A one-time screening for abdominal aortic aneurysm (AAA) by ultrasound is recommended for men aged 65-75 years who are current or former smokers. What should I know about preventing infection? Hepatitis B If you have a higher risk for hepatitis B, you should be screened for this virus. Talk with your health care provider to find out if you are at risk for hepatitis B infection. Hepatitis C Blood testing is recommended for:  Everyone born from [birth year] through 1967.  Anyone with known risk factors for hepatitis C.  Sexually Transmitted Diseases (STDs)  You should be screened each year for STDs including gonorrhea and chlamydia if: ? You are sexually active and are younger than 54 years of age. ? You are older than 53 years of age and your health care provider tells you that you are at risk for this type of infection. ? Your sexual activity has changed since you were last screened and you are at an increased risk for chlamydia or gonorrhea. Ask your health care provider if you are at risk.  Talk with your health care provider about whether you are at high risk of being infected with HIV. Your health care provider may recommend a prescription medicine to help  prevent HIV infection.  What else can I do?  Schedule regular health, dental, and eye exams.  Stay current with your vaccines (immunizations).  Do not use any tobacco products, such as cigarettes, chewing tobacco, and e-cigarettes. If you need help quitting, ask your health care provider.  Limit alcohol intake to no more than 2 drinks per day. One drink equals 12 ounces of beer, 5 ounces of wine, or 1 ounces of hard liquor.  Do not use street drugs.  Do not share needles.  Ask your health care provider for help if you need support or information about quitting drugs.  Tell your health care provider if you often feel depressed.  Tell your health care provider if you have ever been abused or do not feel safe at home. This information is not intended to replace advice given to you by your health care provider. Make sure you discuss any questions you have with your health care provider. Document Released: 06/09/2008 Document Revised:  08/10/2016 Document Reviewed: 09/15/2015 Elsevier Interactive Patient Education  2018 [employer]. Sildenafil tablets (Viagra) What is this medicine? SILDENAFIL (sil DEN a fil) is used to treat erection problems in men. This medicine may be used for other purposes; ask your health care provider or pharmacist if you have questions. COMMON BRAND NAME(S): Viagra What should I tell my health care provider before I take this medicine? They need to know if you have any of these conditions: -bleeding disorders -eye or vision problems, including a rare inherited eye disease called retinitis pigmentosa -anatomical deformation of the penis, Peyronie's disease, or history of priapism (painful and prolonged erection) -heart disease, angina, a history of heart attack, irregular heart beats, or other heart problems -high or low blood pressure -history of blood diseases, like sickle cell anemia or leukemia -history of stomach bleeding -kidney disease -liver  disease -stroke -an unusual or allergic reaction to sildenafil, other medicines, foods, dyes, or preservatives -pregnant or trying to get pregnant -breast-feeding How should I use this medicine? Take this medicine by mouth with a glass of water. Follow the directions on the prescription label. The dose is usually taken 1 hour before sexual activity. You should not take the dose more than once per day. Do not take your medicine more often than directed. Talk to your pediatrician regarding the use of this medicine in children. This medicine is not used in children for this condition. Overdosage: If you think you have taken too much of this medicine contact a poison control center or emergency room at once. NOTE: This medicine is only for you. Do not share this medicine with others. What if I miss a dose? This does not apply. Do not take double or extra doses. What may interact with this medicine? Do not take this medicine with any of the following medications: -cisapride -nitrates like amyl nitrite, isosorbide dinitrate, isosorbide mononitrate, nitroglycerin -riociguat This medicine may also interact with the following medications: -antiviral medicines for HIV or AIDS -bosentan -certain medicines for benign prostatic hyperplasia (BPH) -certain medicines for blood pressure -certain medicines for fungal infections like ketoconazole and itraconazole -cimetidine -erythromycin -rifampin This list may not describe all possible interactions. Give your health care provider a list of all the medicines, herbs, non-prescription drugs, or dietary supplements you use. Also tell them if you smoke, drink alcohol, or use illegal drugs. Some items may interact with your medicine. What should I watch for while using this medicine? If you notice any changes in your vision while taking this drug, call your doctor or health care professional as soon as possible. Stop using this medicine and call your health  care provider right away if you have a loss of sight in one or both eyes. Contact your doctor or health care professional right away if you have an erection that lasts longer than 4 hours or if it becomes painful. This may be a sign of a serious problem and must be treated right away to prevent permanent damage. If you experience symptoms of nausea, dizziness, chest pain or arm pain upon initiation of sexual activity after taking this medicine, you should refrain from further activity and call your doctor or health care professional as soon as possible. Do not drink alcohol to excess (examples, 5 glasses of wine or 5 shots of whiskey) when taking this medicine. When taken in excess, alcohol can increase your chances of getting a headache or getting dizzy, increasing your heart rate or lowering your blood pressure. Using this medicine does  not protect you or your partner against HIV infection (the virus that causes AIDS) or other sexually transmitted diseases. What side effects may I notice from receiving this medicine? Side effects that you should report to your doctor or health care professional as soon as possible: -allergic reactions like skin rash, itching or hives, swelling of the face, lips, or tongue -breathing problems -changes in hearing -changes in vision -chest pain -fast, irregular heartbeat -prolonged or painful erection -seizures Side effects that usually do not require medical attention (report to your doctor or health care professional if they continue or are bothersome): -back pain -dizziness -flushing -headache -indigestion -muscle aches -nausea -stuffy or runny nose This list may not describe all possible side effects. Call your doctor for medical advice about side effects. You may report side effects to FDA at 1-800-FDA-1088. Where should I keep my medicine? Keep out of reach of children. Store at room temperature between 15 and 30 degrees C (59 and 86 degrees F). Throw  away any unused medicine after the expiration date. NOTE: This sheet is a summary. It may not cover all possible information. If you have questions about this medicine, talk to your doctor, pharmacist, or health care provider.  2018 Elsevier/Gold Standard (2015-11-25 12:00:25)

## 2018-06-23 DIAGNOSIS — Z9884 Bariatric surgery status: Secondary | ICD-10-CM | POA: Insufficient documentation

## 2018-06-23 DIAGNOSIS — Z0000 Encounter for general adult medical examination without abnormal findings: Secondary | ICD-10-CM | POA: Insufficient documentation

## 2018-06-23 DIAGNOSIS — N529 Male erectile dysfunction, unspecified: Secondary | ICD-10-CM | POA: Insufficient documentation

## 2018-06-23 NOTE — Assessment & Plan Note (Signed)
Discussed treatment option, PDE-5 inhibitor; how best taken, Rx provided

## 2018-06-23 NOTE — Assessment & Plan Note (Signed)
USPSTF grade A and B recommendations reviewed with patient; age-appropriate recommendations, preventive care, screening tests, etc discussed and encouraged; healthy living encouraged; see AVS for patient education given to patient  

## 2018-06-23 NOTE — Assessment & Plan Note (Signed)
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

## 2018-06-23 NOTE — Assessment & Plan Note (Signed)
I explained that he should NOT take NSAIDs, could result in an ulcer, GI bleed; tylenol safest option; offered pain clinic referral for other options; he declined

## 2018-07-11 DIAGNOSIS — Z9884 Bariatric surgery status: Secondary | ICD-10-CM | POA: Diagnosis not present

## 2018-07-23 DIAGNOSIS — M793 Panniculitis, unspecified: Secondary | ICD-10-CM | POA: Diagnosis not present

## 2018-07-25 ENCOUNTER — Telehealth: Payer: Self-pay | Admitting: Internal Medicine

## 2018-07-25 NOTE — Telephone Encounter (Signed)
Patient calling  Patient is dissatisfied with treatment and has issue with bills Offered to transfer to billing but declined - states he already spoke with billing and they told him to contact doctor office Offered several times to transfer to office manager but declined Patient wished to speak with doctor but doctor unavailable

## 2018-07-26 ENCOUNTER — Telehealth: Payer: Self-pay | Admitting: Internal Medicine

## 2018-07-26 NOTE — Telephone Encounter (Signed)
Returned call. Documentation: Pt very upset because he was seen 05/23/18 by DR. Pt received results of sleep study 6/28 and orders placed for cpap therapy. [facility] sent to [facility] on[facility]/1/19. Pt has not heard anything back from [facility]. Pt upset with DR because he states he explained high tolerance to sleep medication. He informed provider of medications previously tried and failed. He was given Ambien CR 12.5 mg in which he had previously tried and was ineffective. Pt states he now has a $50 bill but not satifsfied with services received by our office.

## 2018-07-26 NOTE — Telephone Encounter (Signed)
[facility] ([facility]) no contact has been made to patient or to [place] advising that something was needed to process this order. Pt has been waiting x 1 month for CPAP.  After speaking with patient, we decided to send order to another DME for ASAP set up.  Order has been faxed to [facility].  Pt is aware and pt has been advised that if he hasn't heard anything by Tuesday to contact us back.  [place] and cancelled order. Confirmation received that [facility] has received order. Rhonda J Cobb

## 2018-07-26 NOTE — Telephone Encounter (Signed)
Pt states his call got dropped, please return the call.

## 2018-07-26 NOTE — Telephone Encounter (Signed)
Returned call to patient. Documentation on another phone encounter for same day. Pt will be contacted by [facility] in regards to cpap therapy.

## 2018-07-30 ENCOUNTER — Telehealth: Payer: Self-pay | Admitting: Internal Medicine

## 2018-07-30 NOTE — Telephone Encounter (Signed)
Returned call to [employer]. She needed demo and last office visit as she doesn't have epic assess. Sent.

## 2018-07-30 NOTE — Telephone Encounter (Signed)
Harold Smith from [facility] calling States they received an order for CPAP but are missing information Please call to discuss

## 2018-08-06 ENCOUNTER — Encounter: Payer: Self-pay | Admitting: *Deleted

## 2018-08-06 ENCOUNTER — Ambulatory Visit: Payer: BLUE CROSS/BLUE SHIELD | Admitting: Internal Medicine

## 2018-08-06 NOTE — Telephone Encounter (Signed)
A letter has been mailed to patient stating response from previous message and a cpap therapy follow up appt needed Done.

## 2018-08-06 NOTE — Telephone Encounter (Signed)
Sorry that he is not feeling better. Plan was to increase to a stronger medicine but wanted to see if symptoms improved with cpap before doing that. Will have him follow up after he has been on CPAP for 1 month and see how he is doing and determine need for other medicines.

## 2018-08-13 DIAGNOSIS — G4733 Obstructive sleep apnea (adult) (pediatric): Secondary | ICD-10-CM | POA: Diagnosis not present

## 2018-08-29 ENCOUNTER — Encounter: Payer: Self-pay | Admitting: Internal Medicine

## 2018-09-03 ENCOUNTER — Encounter: Payer: Self-pay | Admitting: Internal Medicine

## 2018-09-03 ENCOUNTER — Ambulatory Visit (INDEPENDENT_AMBULATORY_CARE_PROVIDER_SITE_OTHER): Payer: BLUE CROSS/BLUE SHIELD | Admitting: Internal Medicine

## 2018-09-03 VITALS — BP 130/86 | HR 82 | Resp 16 | Ht 71.0 in | Wt 200.0 lb

## 2018-09-03 DIAGNOSIS — G4726 Circadian rhythm sleep disorder, shift work type: Secondary | ICD-10-CM

## 2018-09-03 DIAGNOSIS — G4733 Obstructive sleep apnea (adult) (pediatric): Secondary | ICD-10-CM

## 2018-09-03 MED ORDER — ZOLPIDEM TARTRATE 10 MG PO TABS: 10.0000 mg | ORAL_TABLET | Freq: Every evening | ORAL | 5 refills | Status: DC | PRN

## 2018-09-03 NOTE — Patient Instructions (Signed)
Continue using CPAP every night.  Will start ambien 10 mg at bedtime.

## 2018-09-03 NOTE — Progress Notes (Signed)
[facility]     Assessment and Plan:  Sleep maintenance type insomnia. - Patient has trouble staying asleep, with multiple awakenings about the night.  Suspect this is likely secondary to circadian rhythm/shift workers sleep disorder. - Not doing well with Ambien CR.  Will change to Ambien immediate release 10 mg nightly.  Circadian rhythm disorder, shift workers sleep disorder. -Discussed aspects of sleep hygiene, trying to go to bed at the same time on his work days. - Discussed trying to limit light exposure in the evening. - Discussed that if sleep study negative, can consider dose of a stimulant during waking hours such as modafinil.  However patient tells me that he would not find this necessary at this time.   Obstructive sleep apnea. - Patient currently using CPAP, still getting used to the machine, experiencing a lot of issues with dryness. - Encouraged to increase humidity setting on his machine, continue to use CPAP and increase use.    Date: 09/03/2018  MRN# [id number] Harold Smith 1966/09/13   Vuk Zeeshan Korte is a 52 y.o. old male seen in consultation for chief complaint of:    Chief Complaint  Patient presents with  . Sleep Apnea    pt here for compliance on cpap. He is having some issues with nasal mask.    HPI:  The patient is a 52 year old male with symptoms of excessive daytime sleepiness, he was also noted to have difficulty staying asleep overnight.  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  He was noted to have a previous history of sleep apnea, therefore went for a repeat study to reestablish the diagnosis and restart on CPAP.  Last visit was also suspect that he may have some shift workers sleep disorder, we discussed that starting on Ambien.  He has had CPAP for about 3 weeks but is still getting used to it. It has been drying him out. He is upset that we  ordered time release ambien instead of the regular ambien. Tried to explain that this is changed by insurance but he is very upset.   **CPAP download 07/31/2018-08/29/2018>> usage greater than 4 hours is 4/30 days.  Average usage on days used was 3 hours 44 minutes.  Pressure range of 5-15.  Median pressure 6, 95th percentile pressure is 9, maximum pressure 11.  Residual AHI 0.9.  This shows poor compliance with CPAP with excellent control of obstructive sleep apnea when used.  **HST 06/18/2018>> mild OSA with AHI of 9.7.  Started on auto CPAP 5-15.  Medication:    Current Outpatient Medications:  .  Cyanocobalamin (VITAMIN B12 PO), Take by mouth., Disp: , Rfl:  .  multivitamin (ONE-A-DAY MEN'S) TABS tablet, Take 1 tablet by mouth daily., Disp: , Rfl:  .  sildenafil (VIAGRA) 100 MG tablet, Take 0.5 tablets (50 mg total) by mouth daily as needed for erectile dysfunction., Disp: 5 tablet, Rfl: 5 .  zolpidem (AMBIEN CR) 12.5 MG CR tablet, Take 1 tablet (12.5 mg total) by mouth at bedtime as needed for sleep. (Patient not taking: Reported on 06/22/2018), Disp: 30 tablet, Rfl: 0   Allergies:  Patient has no known allergies.  Review of Systems:  Constitutional: Feels well. Cardiovascular: No chest pain.  Pulmonary: Denies dyspnea.   The remainder of systems were reviewed and were found to be negative other than what is documented in the HPI.  Physical Examination:   VS: BP 130/86 (BP Location: Left Arm, Cuff Size: Normal)   Pulse 82   Resp 16   Ht 5\' 11"  (1.803 m)   Wt 200 lb (90.7 kg)   SpO2 96%   BMI 27.89 kg/m   General Appearance: No distress  Neuro:without focal findings, mental status, speech normal, alert and oriented HEENT: PERRLA, EOM intact Pulmonary: No wheezing, No rales  CardiovascularNormal S1,S2.  No m/r/g.  Abdomen: Benign, Soft, non-tender, No masses Renal:  No costovertebral tenderness  GU:  No performed at this time. Endoc: No evident thyromegaly, no signs of  acromegaly or Cushing features Skin:   warm, no rashes, no ecchymosis  Extremities: normal, no cyanosis, clubbing.     LABORATORY PANEL:   CBC No results for input(s): WBC, HGB, HCT, PLT in the last 168 hours. ------------------------------------------------------------------------------------------------------------------  Chemistries  No results for input(s): NA, K, CL, CO2, GLUCOSE, BUN, CREATININE, CALCIUM, MG, AST, ALT, ALKPHOS, BILITOT in the last 168 hours.  Invalid input(s): GFRCGP ------------------------------------------------------------------------------------------------------------------  Cardiac Enzymes No results for input(s): TROPONINI in the last 168 hours. ------------------------------------------------------------  RADIOLOGY:  No results found.     Thank  you for the consultation and for allowing [facility] to assist in the care of your patient. Our recommendations are noted above.  Please contact us if we can be of further service.  Marda Stalker, M.D., F.C.C.P.  Board Certified in Internal Medicine, Pulmonary Medicine, [place], and Sleep Medicine.  [facility] Office Number: [phone]  09/03/2018

## 2018-09-13 DIAGNOSIS — G4733 Obstructive sleep apnea (adult) (pediatric): Secondary | ICD-10-CM | POA: Diagnosis not present

## 2018-10-13 DIAGNOSIS — G4733 Obstructive sleep apnea (adult) (pediatric): Secondary | ICD-10-CM | POA: Diagnosis not present

## 2018-11-13 DIAGNOSIS — G4733 Obstructive sleep apnea (adult) (pediatric): Secondary | ICD-10-CM | POA: Diagnosis not present

## 2018-12-13 DIAGNOSIS — G4733 Obstructive sleep apnea (adult) (pediatric): Secondary | ICD-10-CM | POA: Diagnosis not present

## 2018-12-21 ENCOUNTER — Ambulatory Visit: Payer: BLUE CROSS/BLUE SHIELD | Admitting: Family Medicine

## 2018-12-21 ENCOUNTER — Encounter: Payer: Self-pay | Admitting: Family Medicine

## 2018-12-21 VITALS — BP 128/84 | HR 76 | Temp 98.6°F | Ht 73.0 in | Wt 207.7 lb

## 2018-12-21 DIAGNOSIS — Z113 Encounter for screening for infections with a predominantly sexual mode of transmission: Secondary | ICD-10-CM | POA: Diagnosis not present

## 2018-12-21 NOTE — Patient Instructions (Signed)
We'll complete the testing to see if you've been exposed to herpes simplex Per your request, we're not doing any other testing Please do practice safe sex practices to prevent the spread of sexually transmitted diseases

## 2018-12-21 NOTE — Progress Notes (Signed)
BP 128/84   Pulse 76   Temp 98.6 F (37 C)   Ht 6\' 1"  (1.854 m)   Wt 207 lb 11.2 oz (94.2 kg)   SpO2 99%   BMI 27.40 kg/m    Subjective:    Patient ID: Harold Smith, male    DOB: 04/01/1966, 52 y.o.   MRN: [id number]  HPI: Harold Smith is a 52 y.o. male  Chief Complaint  Patient presents with  . 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 which was not disclosed to him Never had any sores Would like to be tested for herpes and that's all I tried to get a little more information from him but he was reluctant to offer more info without direct questioning No hx of fever blisters on his mouth No hx of previous STDs He only wants blood work today to see if he was exposed to herpes from previous partner; no current symptoms He had very poor eye contact with the examiner and I asked to make sure sure there wasn't something else going on I verified if he wanted any other testing and he only wants testing for herpes After asking again if there was something else going on, he related that he was very upset and that if he had another doctor, he would not be here for the blood test (I then got the office manager) He was upset after being charged at his physical He reports that I asked him if there was anything else when he was here for his physical, and he asked for a prescription for 800 mg ibuprofen; his previous provider had done that for him before; he has a hx of gastric bypass, for which 800 mg ibuprofen would be contraindicated; he did not ask for a referral to a pain clinic; he states he did not read the sheet we have provided regarding physicals and problem based visits and he was very upset that I double charged him; he felt that I was deceptive and he does not trust me; he has already scheduled an appointment with another provider and plans to leave this  practice  Depression screen [facility] 2/9 12/21/2018 06/22/2018 05/10/2018 12/22/2015  Decreased Interest 0 0 0 0  Down, Depressed, Hopeless 0 0 0 0  PHQ - 2 Score 0 0 0 0  Altered sleeping 0 - - -  Tired, decreased energy 0 - - -  Change in appetite 0 - - -  Feeling bad or failure about yourself  0 - - -  Trouble concentrating 0 - - -  Moving slowly or fidgety/restless 0 - - -  Suicidal thoughts 0 - - -  PHQ-9 Score 0 - - -  Difficult doing work/chores Not difficult at all - - -   Fall Risk  12/21/2018 06/22/2018 05/10/2018 12/22/2015  Falls in the past year? 0 No No No    Relevant past medical, surgical, family and social history reviewed Past Medical History:  Diagnosis Date  . Allergic rhinitis Smith to pollen   . Colon polyps   . Diverticulitis large intestine   . Obesity   . Osteoarthritis, knee   . Sleep disturbance    Past Surgical History:  Procedure Laterality Date  . ACHILLES TENDON REPAIR Right 2008  . COLON RESECTION Left 2006   Smith to diverticular disease at [facility]  .  COLONOSCOPY  2013?  . COLONOSCOPY WITH PROPOFOL N/A 05/22/2018   Procedure: COLONOSCOPY WITH PROPOFOL;  Surgeon: Lin Landsman, MD;  Location: [facility] ENDOSCOPY;  Service: Gastroenterology;  Laterality: N/A;  . GASTRIC BYPASS N/A 2009  . HIATAL HERNIA REPAIR  10/15   at [facility]  . 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 Hypertension Mother   . Diabetes Mother   . Kidney disease Mother   . Heart disease Mother   . Cancer Mother   . Colon cancer Mother   . Cancer Father 2       rectal cancer  . Asthma Sister   . Obesity Sister   . Stroke Sister   . Obesity Brother   . Diabetes Other   . Heart disease Other   . Hypertension Other   . Kidney disease Other    Social History   Tobacco Use  . Smoking status: Never Smoker  . Smokeless tobacco: Never Used  Substance Use Topics  . Alcohol use: Yes    Alcohol/week: 0.0 standard drinks     Comment: occasional  . Drug use: No     Office Visit from 12/21/2018 in [facility]  AUDIT-C Score  1      Interim medical history since last visit reviewed. Allergies and medications reviewed  Review of Systems Per HPI unless specifically indicated above     Objective:    BP 128/84   Pulse 76   Temp 98.6 F (37 C)   Ht 6\' 1"  (1.854 m)   Wt 207 lb 11.2 oz (94.2 kg)   SpO2 99%   BMI 27.40 kg/m   Wt Readings from Last 3 Encounters:  12/21/18 207 lb 11.2 oz (94.2 kg)  09/03/18 200 lb (90.7 kg)  06/22/18 196 lb 8 oz (89.1 kg)    Physical Exam Cardiovascular:     Rate and Rhythm: Normal rate.  Pulmonary:     Effort: Pulmonary effort is normal.  Psychiatric:     Comments: Very poor eye contact; would not look directly at the examiner for several minutes; had his hands over his face, hat pulled low, or looked at his phone; appeared upset but was not loud       Assessment & Plan:   Problem List Items Addressed This Visit    None    Visit Diagnoses    Screen for STD (sexually transmitted disease)    -  Primary   patient only wanted blood test to see if he was exposed during past relationship to herpes; no other testing done, verified that was all he wanted   Relevant Orders   HSV(herpes simplex vrs) 1+2 ab-IgG       Follow up plan: No follow-ups on file.  An after-visit summary was printed and given to the patient at [place].  Please see the patient instructions which may contain other information and recommendations beyond what is mentioned above in the assessment and plan.  No orders of the defined types were placed in this encounter.   Orders Placed This Encounter  Procedures  . HSV(herpes simplex vrs) 1+2 ab-IgG   Discussion with office manager present; patient feels doctor treated him deceptively, does not trust the physician; we agreed to terminate our patient - physician relationship today; office manager present for that  discussion and verified that 30 days of emergency care starts today

## 2018-12-22 ENCOUNTER — Telehealth: Payer: Self-pay | Admitting: Family Medicine

## 2018-12-22 DIAGNOSIS — Z113 Encounter for screening for infections with a predominantly sexual mode of transmission: Secondary | ICD-10-CM

## 2018-12-22 NOTE — Telephone Encounter (Signed)
At patient's recent visit, he shared with me that he thought that he was going to be tested back in May to see if he was exposed to prior herpes infection from a previous partner Patient requested testing to see if he had been exposed to herpes in the past I ordered the type of herpes antibodies that look for evidence of past infection  Please let him know that there is another type of herpes test that can be done if he suspects that he has been exposed in just the last few weeks  We just want to clarify that he wanted to be tested for PAST exposure which would have happened months or years ago If he suspects a very recent exposure, we'll be glad to have the lab do those tests too  Roselyn Reef can have the lab ADD ON IgM antibodies for HSV1 and HSV2 That is NO additional charge with [place], but it would be additional testing with the lab

## 2018-12-24 ENCOUNTER — Telehealth: Payer: Self-pay

## 2018-12-24 ENCOUNTER — Encounter: Payer: Self-pay | Admitting: Family Medicine

## 2018-12-24 DIAGNOSIS — R768 Other specified abnormal immunological findings in serum: Secondary | ICD-10-CM | POA: Insufficient documentation

## 2018-12-24 DIAGNOSIS — B009 Herpesviral infection, unspecified: Secondary | ICD-10-CM | POA: Insufficient documentation

## 2018-12-24 NOTE — Telephone Encounter (Signed)
I returned his call and explained to him that it does appear that he has been exposed to both types of herpes viruses; the type 1 is very positive, the type 2 is equivocal (it's not negative); he has made a lot of antibodies against type 1 He reports that he has never had an outbreak He asked about medicine If he has never had an outbreak, medicine is not needed I encouraged safe sex practices He requested copies of labs; office manager has first set ready to mail IgM tests are pending

## 2018-12-24 NOTE — Telephone Encounter (Signed)
Given to lab to add on

## 2018-12-24 NOTE — Telephone Encounter (Signed)
Pt is extremely upset and would like more clarification. Asked that Dr. Sanda Klein call him back tomorrow morning, 12/25/18.

## 2018-12-24 NOTE — Telephone Encounter (Signed)
Thank you I've ordered the IgM as well Roselyn Reef, please give to tech to ADD ON

## 2018-12-24 NOTE — Telephone Encounter (Signed)
Pt would like a call back tomorrow morning 12/25/18, he has several unanswered questions.

## 2018-12-24 NOTE — Telephone Encounter (Signed)
Copied from [place] [id number]. Topic: General - Other >> Dec 24, 2018  1:44 PM Reyne Dumas L wrote: Reason for CRM:  Pt calling to get additional information about testing that was done for him on 12/27.

## 2018-12-24 NOTE — Telephone Encounter (Signed)
Yes, please add IgM.  Per patient he just wants to know if he is positive or negative for the virus.

## 2018-12-24 NOTE — Telephone Encounter (Signed)
Copied from [place] [id number]. Topic: General - Other >> Dec 24, 2018  1:44 PM Reyne Dumas L wrote: Reason for CRM:  Pt calling to get additional information about testing that was done for him on 12/27.

## 2018-12-26 DIAGNOSIS — G4733 Obstructive sleep apnea (adult) (pediatric): Secondary | ICD-10-CM | POA: Diagnosis not present

## 2018-12-28 LAB — HSV(HERPES SIMPLEX VRS) I + II AB-IGG
HAV 1 IGG,TYPE SPECIFIC AB: 48.7 index — ABNORMAL HIGH
HSV 2 IGG,TYPE SPECIFIC AB: 0.94 index — ABNORMAL HIGH

## 2018-12-28 LAB — HSV 1/2 AB (IGM), IFA W/RFLX TITER
HSV 1 IgM Screen: NEGATIVE
HSV 2 IgM Screen: NEGATIVE

## 2018-12-28 LAB — TEST AUTHORIZATION

## 2019-01-13 DIAGNOSIS — G4733 Obstructive sleep apnea (adult) (pediatric): Secondary | ICD-10-CM | POA: Diagnosis not present

## 2019-02-13 DIAGNOSIS — G4733 Obstructive sleep apnea (adult) (pediatric): Secondary | ICD-10-CM | POA: Diagnosis not present

## 2019-02-14 DIAGNOSIS — R300 Dysuria: Secondary | ICD-10-CM | POA: Diagnosis not present

## 2019-02-14 DIAGNOSIS — N390 Urinary tract infection, site not specified: Secondary | ICD-10-CM | POA: Diagnosis not present

## 2019-03-14 DIAGNOSIS — G4733 Obstructive sleep apnea (adult) (pediatric): Secondary | ICD-10-CM | POA: Diagnosis not present

## 2019-03-24 ENCOUNTER — Other Ambulatory Visit: Payer: Self-pay | Admitting: Internal Medicine

## 2019-03-25 NOTE — Telephone Encounter (Signed)
DR please advise on refill.  Rx last refilled on 09/03/18 #30 with 5 refills.  Pt last seen 09/03/18 and was instructed to f/u in 14mo. Pt does not have a pending OV.

## 2019-04-14 DIAGNOSIS — G4733 Obstructive sleep apnea (adult) (pediatric): Secondary | ICD-10-CM | POA: Diagnosis not present

## 2019-05-07 ENCOUNTER — Encounter: Payer: Self-pay | Admitting: Internal Medicine

## 2019-05-07 ENCOUNTER — Ambulatory Visit (INDEPENDENT_AMBULATORY_CARE_PROVIDER_SITE_OTHER): Payer: BLUE CROSS/BLUE SHIELD | Admitting: Internal Medicine

## 2019-05-07 VITALS — BP 134/57 | Temp 97.8°F | Wt 192.0 lb

## 2019-05-07 DIAGNOSIS — M170 Bilateral primary osteoarthritis of knee: Secondary | ICD-10-CM

## 2019-05-07 DIAGNOSIS — G479 Sleep disorder, unspecified: Secondary | ICD-10-CM | POA: Diagnosis not present

## 2019-05-07 NOTE — Assessment & Plan Note (Signed)
History of OSA---but hasn't been using the CPAP of late and now no symptoms Has lost over 100# since bariatric revision---likely he doesn't need the CPAP now He will need to review this with Dr Ashby Dawes

## 2019-05-07 NOTE — Assessment & Plan Note (Signed)
Not severe Uses ibuprofen prn Discussed using OTC --- 4 at a time He will try that

## 2019-05-07 NOTE — Progress Notes (Signed)
Subjective:    Patient ID: Harold Smith, male    DOB: [birth year], 53 y.o.   MRN: [id number]  HPI Virtual visit to reestablish care Identification done Reviewed billing and he gives consent He is in his truck, and I am in my office  He had a hernia repair and revision of his bariatric surgery Has lost over a hundred pounds  He has ongoing knee and other joint pains He uses ibuprofen 800mg  for prn use May use it only a couple of times a month He does help with unloading, etc (refrigerated food) He is social distancing as much as possible  Current Outpatient Medications on File Prior to Visit  Medication Sig Dispense Refill  . Cyanocobalamin (VITAMIN B12 PO) Take by mouth.    . ferrous sulfate 325 (65 FE) MG EC tablet Take 325 mg by mouth daily with breakfast.    . multivitamin (ONE-A-DAY MEN'S) TABS tablet Take 1 tablet by mouth daily.    Marland Kitchen zolpidem (AMBIEN) 10 MG tablet TAKE 1 TABLET BY MOUTH AT BEDTIME AS NEEDED FOR SLEEP 30 tablet 0   No current facility-administered medications on file prior to visit.     No Known Allergies  Past Medical History:  Diagnosis Date  . Allergic rhinitis due to pollen   . Colon polyps   . Diverticulitis large intestine   . Obesity   . Osteoarthritis, knee   . Sleep disturbance     Past Surgical History:  Procedure Laterality Date  . ACHILLES TENDON REPAIR Right 2008  . COLON RESECTION Left 2006   due to diverticular disease at [facility]  . COLONOSCOPY  2013?  . COLONOSCOPY WITH PROPOFOL N/A 05/22/2018   Procedure: COLONOSCOPY WITH PROPOFOL;  Surgeon: Lin Landsman, MD;  Location: [facility] ENDOSCOPY;  Service: Gastroenterology;  Laterality: N/A;  . GASTRIC BYPASS N/A 2009  . HIATAL HERNIA REPAIR  10/15   at [facility]  . 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 Hypertension Mother   . Diabetes Mother   . Kidney disease Mother   . Heart disease Mother   . Cancer Mother    . Colon cancer Mother   . Cancer Father 46       rectal cancer  . Asthma Sister   . Obesity Sister   . Stroke Sister   . Obesity Brother   . Diabetes Other   . Heart disease Other   . Hypertension Other   . Kidney disease Other     Social History   Socioeconomic History  . Marital status: Single    Spouse name: Not on file  . Number of children: 3  . Years of education: Not on file  . Highest education level: Not on file  Occupational History  . Occupation: Production designer, theatre/television/film  Social Needs  . Financial resource strain: Not on file  . Food insecurity:    Worry: Not on file    Inability: Not on file  . Transportation needs:    Medical: Not on file    Non-medical: Not on file  Tobacco Use  . Smoking status: Never Smoker  . Smokeless tobacco: Never Used  Substance and Sexual Activity  . Alcohol use: Yes    Alcohol/week: 0.0 standard drinks    Comment: occasional  . Drug use: No  . Sexual activity: Yes  Lifestyle  . Physical activity:    Days per week: Not  on file    Minutes per session: Not on file  . Stress: Not on file  Relationships  . Social connections:    Talks on phone: Not on file    Gets together: Not on file    Attends religious service: Not on file    Active member of club or organization: Not on file    Attends meetings of clubs or organizations: Not on file    Relationship status: Not on file  . Intimate partner violence:    Fear of current or ex partner: Not on file    Emotionally abused: Not on file    Physically abused: Not on file    Forced sexual activity: Not on file  Other Topics Concern  . Not on file  Social History Narrative   Lives with 2 daughters from 42st marriage   Shared custody of another daughter from 41nd marriage   Review of Systems Sleeps fairly well Formerly had CPAP---doing better without it (since his weight loss). Had just nasal appliance No longer has daytime somnolence. Sleeps at home every day Appetite is  fine No chest pain No SOB Bowels are fine--no blood. Voids fine--- stream is fine.  Some ED--wonders about his testosterone. Still able to finish sex Tries to work out regularly No heartburn or dysphagia    Objective:   Physical Exam  Constitutional: He appears well-developed. No distress.  Respiratory: Effort normal. No respiratory distress.  Psychiatric: He has a normal mood and affect. His behavior is normal.           Assessment & Plan:

## 2019-05-14 DIAGNOSIS — G4733 Obstructive sleep apnea (adult) (pediatric): Secondary | ICD-10-CM | POA: Diagnosis not present

## 2019-05-15 NOTE — Progress Notes (Signed)
Whatever he prefers. Has he had his repeat hst yet?

## 2019-05-16 ENCOUNTER — Ambulatory Visit (INDEPENDENT_AMBULATORY_CARE_PROVIDER_SITE_OTHER): Payer: BLUE CROSS/BLUE SHIELD | Admitting: Internal Medicine

## 2019-05-16 DIAGNOSIS — G4733 Obstructive sleep apnea (adult) (pediatric): Secondary | ICD-10-CM

## 2019-05-16 DIAGNOSIS — G4719 Other hypersomnia: Secondary | ICD-10-CM | POA: Diagnosis not present

## 2019-05-16 NOTE — Patient Instructions (Signed)
Will order repeat sleep study. You can follow-up with [place] after the sleep study if you are interested in using CPAP, or other treatment options for sleep apnea.

## 2019-05-16 NOTE — Progress Notes (Signed)
[facility]    Virtual Visit via Telephone Note I connected with patient on 05/16/19 at  3:15 PM EDT by telephone and verified that I am speaking with the correct person using two identifiers.   I discussed the limitations, risks, security and privacy concerns of performing an evaluation and management service by telephone and the availability of in person appointments. I also discussed with the patient that there may be a patient responsible charge related to this service. The patient expressed understanding and agreed to proceed. I discussed the assessment and treatment plan with the patient. The patient was provided an opportunity to ask questions and all were answered. The patient agreed with the plan and demonstrated an understanding of the instructions. Please see note below for further detail.    The patient was advised to call back or seek an in-person evaluation if the symptoms worsen or if the condition fails to improve as anticipated.  I provided 15 minutes of non-face-to-face time during this encounter.   Laverle Hobby, MD   Assessment and Plan:  Sleep maintenance type insomnia. - Patient has trouble staying asleep, no improvement with Ambien which he is using once or twice per week.  Appears controlled with this regimen.  Circadian rhythm disorder, shift workers sleep disorder. -Discussed aspects of sleep hygiene, trying to go to bed at the same time on his work days. - Discussed trying to limit light exposure in the evening. - Discussed that if sleep study negative, can consider dose of a stimulant during waking hours such as modafinil.  However patient tells me that he would not find this necessary at this time.   Obstructive sleep apnea. - Patient does not like CPAP.  He has lost a lot of weight since his original sleep study and feels that he is in overall much better shape, patient requesting a repeat sleep study. - OSA was mild on previous  sleep study, this may have resolved with most recent weight loss.  Will order new HST to see if OSA still present.  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  Orders Placed This Encounter  Procedures  . Home sleep test       Date: 05/16/2019  MRN# [id number] Harold Smith 05/29/1966   Harold Smith is a 53 y.o. 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  At previous visit last year he was noted to have obstructive sleep apnea which was mild.  He is a third shift worker, and noted some sleep maintenance insomnia which has been well controlled with occasional Ambien.  He was intolerant to CPAP therefore stopped.  He is also lost a significant amount of weight since his last test and would be interested and repeat sleep  He is currently sleeping without the cpap, and he does not want to use it, however he has a CDL and does not want to be in violation. He feels that he has otherwise been feeling well, he has not felt sleepy, he has not felt sleepy. He goes to bed at 3 to 4 pm, wakes at 1 am and goes to work. He does local driving.  He continues to take [place] once or twice per week before bedtime.   **CPAP download 07/31/2018-08/29/2018>> usage greater than 4 hours is 4/30 days.  Average usage on days used was 3 hours 44 minutes.  Pressure range of 5-15.  Median pressure 6, 95th percentile pressure is 9, maximum pressure 11.  Residual AHI 0.9.  This shows poor compliance with CPAP with excellent control of obstructive sleep apnea when used.  **HST 06/18/2018>> mild OSA with AHI of 9.7.  Started on auto CPAP 5-15.  Medication:    Current Outpatient Medications:  .  Cyanocobalamin (VITAMIN B12 PO), Take by mouth., Disp: , Rfl:  .  ferrous sulfate 325 (65 FE) MG EC tablet, Take 325 mg by mouth daily with breakfast., Disp: , Rfl:  .  multivitamin  (ONE-A-DAY MEN'S) TABS tablet, Take 1 tablet by mouth daily., Disp: , Rfl:  .  zolpidem (AMBIEN) 10 MG tablet, TAKE 1 TABLET BY MOUTH AT BEDTIME AS NEEDED FOR SLEEP, Disp: 30 tablet, Rfl: 0   Allergies:  Patient has no known allergies.  Review of Systems:  Constitutional: Feels well. Cardiovascular: No chest pain.  Pulmonary: Denies dyspnea.   The remainder of systems were reviewed and were found to be negative other than what is documented in the HPI.    Physical Examination:  --  LABORATORY PANEL:   CBC No results for input(s): WBC, HGB, HCT, PLT in the last 168 hours. ------------------------------------------------------------------------------------------------------------------  Chemistries  No results for input(s): NA, K, CL, CO2, GLUCOSE, BUN, CREATININE, CALCIUM, MG, AST, ALT, ALKPHOS, BILITOT in the last 168 hours.  Invalid input(s): GFRCGP ------------------------------------------------------------------------------------------------------------------  Cardiac Enzymes No results for input(s): TROPONINI in the last 168 hours. ------------------------------------------------------------  RADIOLOGY:  No results found.     Thank  you for the consultation and for allowing [facility] to assist in the care of your patient. Our recommendations are noted above.  Please contact us if we can be of further service.  Marda Stalker, M.D., F.C.C.P.  Board Certified in Internal Medicine, Pulmonary Medicine, [place], and Sleep Medicine.  [facility] Office Number: [phone]  05/16/2019

## 2019-05-21 ENCOUNTER — Ambulatory Visit: Payer: BLUE CROSS/BLUE SHIELD

## 2019-05-21 DIAGNOSIS — G4719 Other hypersomnia: Secondary | ICD-10-CM

## 2019-05-21 DIAGNOSIS — G4733 Obstructive sleep apnea (adult) (pediatric): Secondary | ICD-10-CM

## 2019-05-22 DIAGNOSIS — G4733 Obstructive sleep apnea (adult) (pediatric): Secondary | ICD-10-CM | POA: Diagnosis not present

## 2019-05-23 ENCOUNTER — Telehealth: Payer: Self-pay | Admitting: Internal Medicine

## 2019-05-23 DIAGNOSIS — G4733 Obstructive sleep apnea (adult) (pediatric): Secondary | ICD-10-CM | POA: Diagnosis not present

## 2019-05-23 NOTE — Telephone Encounter (Signed)
HST performed on 05/21/2019 confirmed moderate OSA with AHI of 21. Recommend auto cpap 5-20 h2O is recommended. Pt is aware of results and voiced his understanding.  Pt stated that he does currently have a cpap, however he has not been wearing the machine because he has been sleeping well.  Pt had questions regarding an implant for OSA. He is unsure of the name, however he has seen it on tv. Pt stated that if he did have to proceed with the machine, he would like to try a different mask.  DR please advise.

## 2019-05-24 NOTE — Telephone Encounter (Signed)
There is an implantable device for sleep apnea. He can learn about it at [email or web address]. If he would like to be referred to a physician that implants the device we can do that. If he would like to try a different mask we can refer him to mask fitting clinic (if it is open yet) or he can go back to his DME company and request a new mask.

## 2019-05-24 NOTE — Telephone Encounter (Signed)
Left message for pt

## 2019-05-24 NOTE — Telephone Encounter (Signed)
Relayed below recommendations to pt. Pt would like to proceed with implant.   DR please advise on which practice does these implants locally?

## 2019-05-24 NOTE — Telephone Encounter (Signed)
[facility] in [place] is closest location. [phone]. Please call them and confirm that they perform the [facility] implantable stimulator for treatment of sleep apnea.

## 2019-05-24 NOTE — Telephone Encounter (Signed)
Referral has been placed to [facility].  Spoke with [name],who confirmed that they do preform  [employer]sp[employer] implantable stimulator. Nothing further is needed at this time.

## 2019-05-28 DIAGNOSIS — Z9989 Dependence on other enabling machines and devices: Secondary | ICD-10-CM | POA: Diagnosis not present

## 2019-05-28 DIAGNOSIS — G4733 Obstructive sleep apnea (adult) (pediatric): Secondary | ICD-10-CM | POA: Insufficient documentation

## 2019-05-28 DIAGNOSIS — J342 Deviated nasal septum: Secondary | ICD-10-CM | POA: Diagnosis not present

## 2019-05-28 DIAGNOSIS — Z7289 Other problems related to lifestyle: Secondary | ICD-10-CM | POA: Diagnosis not present

## 2019-05-30 ENCOUNTER — Other Ambulatory Visit: Payer: Self-pay | Admitting: Otolaryngology

## 2019-06-04 ENCOUNTER — Other Ambulatory Visit: Payer: Self-pay

## 2019-06-04 ENCOUNTER — Encounter (HOSPITAL_BASED_OUTPATIENT_CLINIC_OR_DEPARTMENT_OTHER): Payer: Self-pay

## 2019-06-06 ENCOUNTER — Other Ambulatory Visit (HOSPITAL_COMMUNITY)
Admission: RE | Admit: 2019-06-06 | Discharge: 2019-06-06 | Disposition: A | Discharge status: Home / Self Care | Payer: BC Managed Care – PPO | Source: Ambulatory Visit | Attending: Otolaryngology | Admitting: Otolaryngology

## 2019-06-06 ENCOUNTER — Other Ambulatory Visit: Payer: Self-pay

## 2019-06-06 DIAGNOSIS — Z1159 Encounter for screening for other viral diseases: Secondary | ICD-10-CM | POA: Diagnosis not present

## 2019-06-07 LAB — NOVEL CORONAVIRUS, NAA (HOSP ORDER, SEND-OUT TO REF LAB; TAT 18-24 HRS): SARS-CoV-2, NAA: NOT DETECTED

## 2019-06-10 ENCOUNTER — Other Ambulatory Visit: Payer: Self-pay

## 2019-06-10 ENCOUNTER — Ambulatory Visit (HOSPITAL_BASED_OUTPATIENT_CLINIC_OR_DEPARTMENT_OTHER): Payer: BC Managed Care – PPO | Admitting: Anesthesiology

## 2019-06-10 ENCOUNTER — Encounter (HOSPITAL_BASED_OUTPATIENT_CLINIC_OR_DEPARTMENT_OTHER): Payer: Self-pay | Admitting: Certified Registered"

## 2019-06-10 ENCOUNTER — Encounter (HOSPITAL_BASED_OUTPATIENT_CLINIC_OR_DEPARTMENT_OTHER)
Admission: RE | Disposition: A | Discharge status: Home / Self Care | Payer: Self-pay | Source: Home / Self Care | Attending: Otolaryngology

## 2019-06-10 ENCOUNTER — Ambulatory Visit (HOSPITAL_BASED_OUTPATIENT_CLINIC_OR_DEPARTMENT_OTHER)
Admission: RE | Admit: 2019-06-10 | Discharge: 2019-06-10 | Disposition: A | Discharge status: Home / Self Care | Payer: BC Managed Care – PPO | Attending: Otolaryngology | Admitting: Otolaryngology

## 2019-06-10 DIAGNOSIS — K5730 Diverticulosis of large intestine without perforation or abscess without bleeding: Secondary | ICD-10-CM | POA: Diagnosis not present

## 2019-06-10 DIAGNOSIS — G4730 Sleep apnea, unspecified: Secondary | ICD-10-CM | POA: Insufficient documentation

## 2019-06-10 DIAGNOSIS — Z9884 Bariatric surgery status: Secondary | ICD-10-CM | POA: Insufficient documentation

## 2019-06-10 DIAGNOSIS — J301 Allergic rhinitis due to pollen: Secondary | ICD-10-CM | POA: Diagnosis not present

## 2019-06-10 DIAGNOSIS — G4733 Obstructive sleep apnea (adult) (pediatric): Secondary | ICD-10-CM | POA: Insufficient documentation

## 2019-06-10 DIAGNOSIS — N529 Male erectile dysfunction, unspecified: Secondary | ICD-10-CM | POA: Diagnosis not present

## 2019-06-10 HISTORY — PX: DRUG INDUCED ENDOSCOPY: SHX6808

## 2019-06-10 HISTORY — DX: Sleep apnea, unspecified: G47.30

## 2019-06-10 SURGERY — DRUG INDUCED SLEEP ENDOSCOPY
Anesthesia: General | Site: Throat

## 2019-06-10 MED ORDER — SCOPOLAMINE 1 MG/3DAYS TD PT72: 1.0000 | MEDICATED_PATCH | Freq: Once | TRANSDERMAL | Status: DC | PRN

## 2019-06-10 MED ORDER — LACTATED RINGERS IV SOLN
INTRAVENOUS | Status: DC
  Administered 2019-06-10: 07:00:00 via INTRAVENOUS

## 2019-06-10 MED ORDER — CHLORHEXIDINE GLUCONATE CLOTH 2 % EX PADS: 6.0000 | MEDICATED_PAD | Freq: Once | CUTANEOUS | Status: DC

## 2019-06-10 MED ORDER — OXYMETAZOLINE HCL 0.05 % NA SOLN
NASAL | Status: AC
  Filled 2019-06-10: qty 30

## 2019-06-10 MED ORDER — PROPOFOL 500 MG/50ML IV EMUL
INTRAVENOUS | Status: DC | PRN
  Administered 2019-06-10: 35 ug/kg/min via INTRAVENOUS

## 2019-06-10 MED ORDER — OXYCODONE HCL 5 MG PO TABS: 5.0000 mg | ORAL_TABLET | Freq: Once | ORAL | Status: DC | PRN

## 2019-06-10 MED ORDER — FENTANYL CITRATE (PF) 100 MCG/2ML IJ SOLN: 50.0000 ug | INTRAMUSCULAR | Status: DC | PRN

## 2019-06-10 MED ORDER — FENTANYL CITRATE (PF) 100 MCG/2ML IJ SOLN: 25.0000 ug | INTRAMUSCULAR | Status: DC | PRN

## 2019-06-10 MED ORDER — OXYCODONE HCL 5 MG/5ML PO SOLN: 5.0000 mg | Freq: Once | ORAL | Status: DC | PRN

## 2019-06-10 MED ORDER — ONDANSETRON HCL 4 MG/2ML IJ SOLN
INTRAMUSCULAR | Status: DC | PRN
  Administered 2019-06-10: 4 mg via INTRAVENOUS

## 2019-06-10 MED ORDER — ONDANSETRON HCL 4 MG/2ML IJ SOLN: 4.0000 mg | Freq: Once | INTRAMUSCULAR | Status: DC | PRN

## 2019-06-10 MED ORDER — MIDAZOLAM HCL 2 MG/2ML IJ SOLN: 1.0000 mg | INTRAMUSCULAR | Status: DC | PRN

## 2019-06-10 MED ORDER — LIDOCAINE-EPINEPHRINE 1 %-1:100000 IJ SOLN
INTRAMUSCULAR | Status: AC
  Filled 2019-06-10: qty 1

## 2019-06-10 SURGICAL SUPPLY — 12 items
CANISTER SUCT 1200ML W/VALVE (MISCELLANEOUS) ×2 IMPLANT
COVER WAND RF STERILE (DRAPES) IMPLANT
GLOVE BIOGEL M 7.0 STRL (GLOVE) ×2 IMPLANT
NEEDLE PRECISIONGLIDE 27X1.5 (NEEDLE) IMPLANT
PACK BASIN DAY SURGERY FS (CUSTOM PROCEDURE TRAY) ×2 IMPLANT
PATTIES SURGICAL .5 X3 (DISPOSABLE) IMPLANT
SHEET MEDIUM DRAPE 40X70 STRL (DRAPES) ×2 IMPLANT
SOLUTION ANTI FOG 6CC (MISCELLANEOUS) ×2 IMPLANT
SPONGE NEURO XRAY DETECT 1X3 (DISPOSABLE) IMPLANT
SYR CONTROL 10ML LL (SYRINGE) IMPLANT
TOWEL GREEN STERILE FF (TOWEL DISPOSABLE) ×2 IMPLANT
TUBE CONNECTING 20X1/4 (TUBING) IMPLANT

## 2019-06-10 NOTE — Transfer of Care (Signed)
Immediate Anesthesia Transfer of Care Note  Patient: Harold Smith  Procedure(s) Performed: DRUG INDUCED SLEEP ENDOSCOPY (N/A Throat)  Patient Location: PACU  Anesthesia Type:MAC  Level of Consciousness: awake, alert , oriented and patient cooperative  Airway & Oxygen Therapy: Patient Spontanous Breathing  Post-op Assessment: Report given to RN and Post -op Vital signs reviewed and stable  Post vital signs: Reviewed and stable  Last Vitals:  Vitals Value Taken Time  BP    Temp    Pulse 62 06/10/19 0807  Resp 12 06/10/19 0807  SpO2 100 % 06/10/19 0807  Vitals shown include unvalidated device data.  Last Pain:  Vitals:   06/10/19 0659  TempSrc: Oral  PainSc: 0-No pain         Complications: No apparent anesthesia complications

## 2019-06-10 NOTE — H&P (Signed)
Harold Smith is an 53 y.o. male.   Chief Complaint: OSA HPI: Hx of OSA and unable to tolerated CPAP  Past Medical History:  Diagnosis Date  . Allergic rhinitis due to pollen   . Colon polyps   . Diverticulitis large intestine   . Obesity   . Osteoarthritis, knee   . Sleep apnea    does not use cpap  . Sleep disturbance     Past Surgical History:  Procedure Laterality Date  . ACHILLES TENDON REPAIR Right 2008  . COLON RESECTION Left 2006   due to diverticular disease at [facility]  . COLONOSCOPY  2013?  . COLONOSCOPY WITH PROPOFOL N/A 05/22/2018   Procedure: COLONOSCOPY WITH PROPOFOL;  Surgeon: Lin Landsman, MD;  Location: [facility] ENDOSCOPY;  Service: Gastroenterology;  Laterality: N/A;  . GASTRIC BYPASS N/A 2009  . HIATAL HERNIA REPAIR  10/15   at [facility]  . 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 Hypertension Mother   . Diabetes Mother   . Kidney disease Mother   . Heart disease Mother   . Cancer Mother   . Colon cancer Mother   . Cancer Father 55       rectal cancer  . Asthma Sister   . Obesity Sister   . Stroke Sister   . Obesity Brother   . Diabetes Other   . Heart disease Other   . Hypertension Other   . Kidney disease Other    Social History:  reports that he has never smoked. He has never used smokeless tobacco. He reports current alcohol use. He reports that he does not use drugs.  Allergies: No Known Allergies  Medications Prior to Admission  Medication Sig Dispense Refill  . Cyanocobalamin (VITAMIN B12 PO) Take by mouth.    . ferrous sulfate 325 (65 FE) MG EC tablet Take 325 mg by mouth daily with breakfast.    . multivitamin (ONE-A-DAY MEN'S) TABS tablet Take 1 tablet by mouth daily.    Marland Kitchen zolpidem (AMBIEN) 10 MG tablet TAKE 1 TABLET BY MOUTH AT BEDTIME AS NEEDED FOR SLEEP 30 tablet 0    No results found for this or any previous visit (from the past 48 hour(s)). No results  found.  Review of Systems  Constitutional: Negative.   HENT:       Snoring  Respiratory: Negative.   Cardiovascular: Negative.     Blood pressure (!) 153/105, temperature 98.5 F (36.9 C), temperature source Oral, resp. rate 18, height 6\' 1"  (1.854 m), weight 91.7 kg, SpO2 100 %. Physical Exam  Constitutional: He appears well-developed and well-nourished.  Neck: Normal range of motion. Neck supple.  Respiratory: Effort normal.  GI: Soft.     Assessment/Plan Adm for DISE as OP under sedation  Jerrell Belfast, MD 06/10/2019, 7:27 AM

## 2019-06-10 NOTE — Anesthesia Postprocedure Evaluation (Signed)
Anesthesia Post Note  Patient: Harold Smith  Procedure(s) Performed: DRUG INDUCED SLEEP ENDOSCOPY (N/A Throat)     Patient location during evaluation: PACU Anesthesia Type: General Level of consciousness: awake and alert Pain management: pain level controlled Vital Signs Assessment: post-procedure vital signs reviewed and stable Respiratory status: spontaneous breathing, nonlabored ventilation, respiratory function stable and patient connected to nasal cannula oxygen Cardiovascular status: blood pressure returned to baseline and stable Postop Assessment: no apparent nausea or vomiting Anesthetic complications: no    Last Vitals:  Vitals:   06/10/19 0818 06/10/19 0823  BP:  (!) 146/88  Pulse: 71 (!) 55  Resp: 11 16  Temp:  (!) 36.3 C  SpO2: 100% 100%    Last Pain:  Vitals:   06/10/19 0823  TempSrc: Axillary  PainSc: 0-No pain                 Harold Smith

## 2019-06-10 NOTE — Anesthesia Preprocedure Evaluation (Signed)
Anesthesia Evaluation  Patient identified by MRN, date of birth, ID band Patient awake    Reviewed: Allergy & Precautions, NPO status , Patient's Chart, lab work & pertinent test results  Airway Mallampati: II  TM Distance: >3 FB Neck ROM: Full    Dental  (+) Teeth Intact, Dental Advisory Given   Pulmonary    breath sounds clear to auscultation       Cardiovascular  Rhythm:Regular Rate:Normal     Neuro/Psych    GI/Hepatic   Endo/Other    Renal/GU      Musculoskeletal   Abdominal   Peds  Hematology   Anesthesia Other Findings   Reproductive/Obstetrics                             Anesthesia Physical Anesthesia Plan  ASA: III  Anesthesia Plan: MAC   Post-op Pain Management:    Induction: Intravenous  PONV Risk Score and Plan: Ondansetron and Propofol infusion  Airway Management Planned: Nasal Cannula and Natural Airway  Additional Equipment:   Intra-op Plan:   Post-operative Plan:   Informed Consent: I have reviewed the patients History and Physical, chart, labs and discussed the procedure including the risks, benefits and alternatives for the proposed anesthesia with the patient or authorized representative who has indicated his/her understanding and acceptance.       Plan Discussed with: Anesthesiologist and CRNA  Anesthesia Plan Comments:         Anesthesia Quick Evaluation

## 2019-06-10 NOTE — Op Note (Signed)
Operative Note: DRUG INDUCED SLEEP ENDOSCOPY  Patient: Harold Smith record number: [id number]  Date:06/10/2019  Pre-operative Indications: 1.  Obstructive Sleep Apnea  Postoperative Indications: Same  Surgical Procedure: 1.  Drug Induced Sleep Endoscopy (DISE)  Anesthesia: MAC with IV sedation  Surgeon: Delsa Bern, M.D.  Complications: None  EBL: None      Brief History: The patient is a 53 y.o. male with a history of obstructive sleep apnea. The patient has undergone previous sleep study which showed mild to moderate levels of obstructive sleep apnea.  The patient was prescribed CPAP which they consistently attempted to use without success.  Given the patient's history and findings, the above drug-induced sleep endoscopy was recommended to assess the patient's anatomic level of apnea.  Risks and benefits were discussed in detail with the patient today understand and agree with our plan for surgery which is scheduled at [place] under sedated anesthesia as an outpatient.  Surgical Procedure: The patient is brought to the operating room on 06/10/2019 and placed in supine position on the operating table.  Intravenous sedated anesthesia was established without difficulty using the standard drug-induced sleep endoscopy protocol. When the patient was adequately anesthetized, surgical timeout was performed and correct identification of the patient and the surgical procedure.    A propofol infusion was administered and the patient was monitored carefully to achieve a level of sedation appropriate for DISE.  The patient did not respond to verbal commands but still had spontaneous respiration, sleep disordered breathing and associated desaturations were observed.  With the patient under adequate sedated anesthesia the flexible nasal laryngoscope was passed without difficulty.  The patient's nasal cavity showed no obstruction.  The endoscope was then passed to visualize the  velopharynx, oropharynx, tongue base and epiglottis to assess areas of obstruction.  Patient's airway showed obstructive changes c/w OSA.   There was no evidence of complete concentric palatal obstruction.  Surgical sponge count was correct. Patient was awakened from anesthetic and transferred from the operating room to the recovery room in stable condition. There were no complications and no blood loss.   Delsa Bern, M.D. [facility] 06/10/2019

## 2019-06-10 NOTE — Discharge Instructions (Signed)

## 2019-06-11 ENCOUNTER — Encounter (HOSPITAL_BASED_OUTPATIENT_CLINIC_OR_DEPARTMENT_OTHER): Payer: Self-pay | Admitting: Otolaryngology

## 2019-06-14 DIAGNOSIS — G4733 Obstructive sleep apnea (adult) (pediatric): Secondary | ICD-10-CM | POA: Diagnosis not present

## 2019-06-19 ENCOUNTER — Telehealth: Payer: Self-pay | Admitting: Internal Medicine

## 2019-06-19 DIAGNOSIS — G4733 Obstructive sleep apnea (adult) (pediatric): Secondary | ICD-10-CM

## 2019-06-19 NOTE — Telephone Encounter (Signed)
Called and spoke to patient. He was trying to get the [facility] implantable device for sleep apnea, but he has too much blockage. Patient is trying to use his CPAP but the nasal mask is not working for him. He is requesting a new mask to try to be more compliant. He will contact us once he gets the mask to set up a follow-up apt.

## 2019-06-20 MED ORDER — ZOLPIDEM TARTRATE 10 MG PO TABS: 10.0000 mg | ORAL_TABLET | Freq: Every evening | ORAL | 0 refills | Status: DC | PRN

## 2019-06-25 ENCOUNTER — Other Ambulatory Visit: Payer: Self-pay | Admitting: Internal Medicine

## 2019-06-25 MED ORDER — ZOLPIDEM TARTRATE 10 MG PO TABS: 10.0000 mg | ORAL_TABLET | Freq: Every evening | ORAL | 0 refills | Status: DC | PRN

## 2019-07-01 ENCOUNTER — Other Ambulatory Visit: Payer: Self-pay | Admitting: Internal Medicine

## 2019-07-04 NOTE — Telephone Encounter (Signed)
Contacted pharmacy who stated that they just refilled rx on 06/30/2019 and sent request for future refill. No refill needed at this time.

## 2019-07-04 NOTE — Telephone Encounter (Signed)
Refilled for 7/30. If he would like it sooner will need to ask him what's the reason it ran out soon.

## 2019-07-14 DIAGNOSIS — G4733 Obstructive sleep apnea (adult) (pediatric): Secondary | ICD-10-CM | POA: Diagnosis not present

## 2019-08-07 DIAGNOSIS — Z20828 Contact with and (suspected) exposure to other viral communicable diseases: Secondary | ICD-10-CM | POA: Diagnosis not present

## 2019-08-07 DIAGNOSIS — J069 Acute upper respiratory infection, unspecified: Secondary | ICD-10-CM | POA: Diagnosis not present

## 2019-08-14 DIAGNOSIS — G4733 Obstructive sleep apnea (adult) (pediatric): Secondary | ICD-10-CM | POA: Diagnosis not present

## 2019-09-14 DIAGNOSIS — G4733 Obstructive sleep apnea (adult) (pediatric): Secondary | ICD-10-CM | POA: Diagnosis not present

## 2019-09-23 DIAGNOSIS — K645 Perianal venous thrombosis: Secondary | ICD-10-CM | POA: Diagnosis not present

## 2019-10-14 ENCOUNTER — Ambulatory Visit (INDEPENDENT_AMBULATORY_CARE_PROVIDER_SITE_OTHER): Payer: BC Managed Care – PPO | Admitting: Internal Medicine

## 2019-10-14 ENCOUNTER — Other Ambulatory Visit: Payer: Self-pay

## 2019-10-14 ENCOUNTER — Encounter: Payer: Self-pay | Admitting: Internal Medicine

## 2019-10-14 VITALS — BP 156/106 | HR 83 | Temp 98.5°F | Ht 70.75 in | Wt 208.0 lb

## 2019-10-14 DIAGNOSIS — Z0000 Encounter for general adult medical examination without abnormal findings: Secondary | ICD-10-CM

## 2019-10-14 DIAGNOSIS — Z125 Encounter for screening for malignant neoplasm of prostate: Secondary | ICD-10-CM

## 2019-10-14 DIAGNOSIS — R030 Elevated blood-pressure reading, without diagnosis of hypertension: Secondary | ICD-10-CM

## 2019-10-14 DIAGNOSIS — G479 Sleep disorder, unspecified: Secondary | ICD-10-CM

## 2019-10-14 DIAGNOSIS — G4733 Obstructive sleep apnea (adult) (pediatric): Secondary | ICD-10-CM | POA: Diagnosis not present

## 2019-10-14 MED ORDER — ZOLPIDEM TARTRATE 10 MG PO TABS: ORAL_TABLET | ORAL | 0 refills | Status: DC

## 2019-10-14 NOTE — Assessment & Plan Note (Signed)
Generally healthy Has kept weight off since gastric bypass Prefers no flu vaccine Will check PSA after discussion Colon due again in May

## 2019-10-14 NOTE — Assessment & Plan Note (Signed)
BP Readings from Last 3 Encounters:  10/14/19 (!) 156/106  06/10/19 (!) 146/88  05/07/19 (!) 134/57   Recheck still up  This is mostly a new thing for him Will check labs He will monitor as outpatient Reevaluate in 1 month or so

## 2019-10-14 NOTE — Assessment & Plan Note (Signed)
Actually sleeps fine without daytime somnolence Mild increased AHI on sleep study--but actually feels worse if he uses CPAP

## 2019-10-14 NOTE — Progress Notes (Signed)
Subjective:    Patient ID: Harold Smith, male    DOB: Mar 14, 1966, 53 y.o.   MRN: [id number]  HPI Here for physical  Did have another sleep study AHI 21 CPAP makes him unable to sleep--despite trial with nasal prongs, etc He feels he sleeps well now No daytime somnolence--even if he works 12-14 hours per day Does use the zolpidem 1-2 times per week (only if he is "wired" and he needs to get to sleep). No AM fatigue after this  Recent visit for an external hemorrhoid Was lanced and started ointment Bled for 2 weeks--had to do light duty at work Now it is finally better (but still some blood on top of his stool)  Current Outpatient Medications on File Prior to Visit  Medication Sig Dispense Refill  . Cyanocobalamin (VITAMIN B12 PO) Take by mouth.    . ferrous sulfate 325 (65 FE) MG EC tablet Take 325 mg by mouth daily with breakfast.    . multivitamin (ONE-A-DAY MEN'S) TABS tablet Take 1 tablet by mouth daily.    Marland Kitchen zolpidem (AMBIEN) 10 MG tablet TAKE ONE TABLET (10 MG) BY MOUTH AT BEDTIME AS NEEDED FOR SLEEP 15 tablet 0   No current facility-administered medications on file prior to visit.     No Known Allergies  Past Medical History:  Diagnosis Date  . Allergic rhinitis due to pollen   . Colon polyps   . Diverticulitis large intestine   . Obesity   . Osteoarthritis, knee   . Sleep apnea    does not use cpap  . Sleep disturbance     Past Surgical History:  Procedure Laterality Date  . ACHILLES TENDON REPAIR Right 2008  . COLON RESECTION Left 2006   due to diverticular disease at [facility]  . COLONOSCOPY  2013?  . COLONOSCOPY WITH PROPOFOL N/A 05/22/2018   Procedure: COLONOSCOPY WITH PROPOFOL;  Surgeon: Lin Landsman, MD;  Location[facility];  Service: Gastroenterology;  Laterality: N/A;  . DRUG INDUCED ENDOSCOPY N/A 06/10/2019   Procedure: DRUG INDUCED SLEEP ENDOSCOPY;  Surgeon: Jerrell Belfast, MD;  Location: [place];  Service: ENT;   Laterality: N/A;  . GASTRIC BYPASS N/A 2009  . HIATAL HERNIA REPAIR  10/15   at [facility]  . 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 Hypertension Mother   . Diabetes Mother   . Kidney disease Mother   . Heart disease Mother   . Cancer Mother   . Colon cancer Mother   . Cancer Father 41       rectal cancer  . Asthma Sister   . Obesity Sister   . Stroke Sister   . Obesity Brother   . Diabetes Other   . Heart disease Other   . Hypertension Other   . Kidney disease Other     Social History   Socioeconomic History  . Marital status: Single    Spouse name: Not on file  . Number of children: 3  . Years of education: Not on file  . Highest education level: Not on file  Occupational History  . Occupation: Production designer, theatre/television/film  Social Needs  . Financial resource strain: Not on file  . Food insecurity    Worry: Not on file    Inability: Not on file  . Transportation needs    Medical: Not on file    Non-medical: Not on file  Tobacco Use  . Smoking status: Never Smoker  . Smokeless tobacco: Never Used  Substance and Sexual Activity  . Alcohol use: Yes    Alcohol/week: 0.0 standard drinks    Comment: occasional  . Drug use: No  . Sexual activity: Yes  Lifestyle  . Physical activity    Days per week: Not on file    Minutes per session: Not on file  . Stress: Not on file  Relationships  . Social Herbalist on phone: Not on file    Gets together: Not on file    Attends religious service: Not on file    Active member of club or organization: Not on file    Attends meetings of clubs or organizations: Not on file    Relationship status: Not on file  . Intimate partner violence    Fear of current or ex partner: Not on file    Emotionally abused: Not on file    Physically abused: Not on file    Forced sexual activity: Not on file  Other Topics Concern  . Not on file  Social History Narrative    Lives with 2 daughters from 51st marriage   Shared custody of another daughter from 2nd marriage   Review of Systems  Constitutional: Negative for fatigue and unexpected weight change.       Wears seat belt Weight fairly stable  HENT: Negative for dental problem, hearing loss and tinnitus.        Keeps up with dentist  Eyes: Negative for visual disturbance.       No diplopia or unilateral vision loss  Respiratory: Negative for cough, chest tightness and shortness of breath.   Cardiovascular: Negative for chest pain, palpitations and leg swelling.  Gastrointestinal: Positive for blood in stool. Negative for abdominal pain and constipation.       No heartburn  Endocrine: Negative for polydipsia and polyuria.  Genitourinary: Negative for difficulty urinating and urgency.       Some mild ED---holding off on medications  Musculoskeletal: Negative for arthralgias, back pain and joint swelling.  Skin:       Has some small pimples on legs--using a body wash No suspicious lesions  Allergic/Immunologic: Negative for environmental allergies and immunocompromised state.  Neurological: Negative for dizziness, syncope, light-headedness and headaches.  Hematological: Negative for adenopathy. Does not bruise/bleed easily.  Psychiatric/Behavioral: Negative for dysphoric mood and sleep disturbance. The patient is not nervous/anxious.        Objective:   Physical Exam  Constitutional: He is oriented to person, place, and time. He appears well-developed. No distress.  HENT:  Head: Normocephalic and atraumatic.  Right Ear: External ear normal.  Left Ear: External ear normal.  Mouth/Throat: Oropharynx is clear and moist. No oropharyngeal exudate.  Eyes: Pupils are equal, round, and reactive to light. Conjunctivae are normal.  Neck: No thyromegaly present.  Cardiovascular: Normal rate, regular rhythm, normal heart sounds and intact distal pulses. Exam reveals no gallop.  No murmur heard.  Respiratory: Effort normal and breath sounds normal. No respiratory distress. He has no wheezes. He has no rales.  GI: Soft. There is no abdominal tenderness.  Musculoskeletal:        General: No tenderness or edema.  Lymphadenopathy:    He has no cervical adenopathy.  Neurological: He is alert and oriented to person, place, and time.  Skin: No rash noted. No erythema.  Psychiatric: He has a normal mood and affect. His behavior is normal.  Assessment & Plan:

## 2019-10-14 NOTE — Patient Instructions (Signed)
Please check your blood pressure once a week or so. Let me know if it is over 150/100---we should start medication. I will check you again in about a month

## 2019-10-15 LAB — LIPID PANEL
Cholesterol: 94 mg/dL (ref 0–200)
HDL: 41.3 mg/dL (ref >39.00)
LDL Cholesterol: 43 mg/dL (ref 0–99)
NonHDL: 52.24
Total CHOL/HDL Ratio: 2
Triglycerides: 45 mg/dL (ref 0.0–149.0)
VLDL: 9 mg/dL (ref 0.0–40.0)

## 2019-10-15 LAB — MICROALBUMIN / CREATININE URINE RATIO
Creatinine,U: 193.9 mg/dL
Microalb Creat Ratio: 4.4 mg/g (ref 0.0–30.0)
Microalb, Ur: 8.6 mg/dL — ABNORMAL HIGH (ref 0.0–1.9)

## 2019-10-15 LAB — COMPREHENSIVE METABOLIC PANEL
ALT: 44 U/L (ref 0–53)
AST: 24 U/L (ref 0–37)
Albumin: 4.1 g/dL (ref 3.5–5.2)
Alkaline Phosphatase: 94 U/L (ref 39–117)
BUN: 12 mg/dL (ref 6–23)
CO2: 28 mEq/L (ref 19–32)
Calcium: 8.5 mg/dL (ref 8.4–10.5)
Chloride: 106 mEq/L (ref 96–112)
Creatinine, Ser: 1.07 mg/dL (ref 0.40–1.50)
GFR: 86.68 mL/min (ref >60.00)
Glucose, Bld: 71 mg/dL (ref 70–99)
Potassium: 3.5 mEq/L (ref 3.5–5.1)
Sodium: 140 mEq/L (ref 135–145)
Total Bilirubin: 0.5 mg/dL (ref 0.2–1.2)
Total Protein: 6.5 g/dL (ref 6.0–8.3)

## 2019-10-15 LAB — CBC
HCT: 40 % (ref 39.0–52.0)
Hemoglobin: 13.4 g/dL (ref 13.0–17.0)
MCHC: 33.4 g/dL (ref 30.0–36.0)
MCV: 93.5 fl (ref 78.0–100.0)
Platelets: 225 10*3/uL (ref 150.0–400.0)
RBC: 4.28 Mil/uL (ref 4.22–5.81)
RDW: 16.3 % — ABNORMAL HIGH (ref 11.5–15.5)
WBC: 9.5 10*3/uL (ref 4.0–10.5)

## 2019-10-15 LAB — PSA: PSA: 1.16 ng/mL (ref 0.10–4.00)

## 2019-10-15 LAB — T4, FREE: Free T4: 0.7 ng/dL (ref 0.60–1.60)

## 2020-03-27 ENCOUNTER — Ambulatory Visit: Payer: BC Managed Care – PPO

## 2020-03-27 ENCOUNTER — Ambulatory Visit: Payer: BC Managed Care – PPO | Attending: Internal Medicine

## 2020-03-27 DIAGNOSIS — Z23 Encounter for immunization: Secondary | ICD-10-CM

## 2020-03-27 NOTE — Progress Notes (Signed)
   Covid-19 Vaccination Clinic  Name:  Harold Smith    MRN: [id number] DOB: 10-Aug-1966  03/27/2020  Harold Smith was observed post Covid-19 immunization for 15 minutes without incident. He was provided with Vaccine Information Sheet and instruction to access the V-Safe system.   Harold Smith was instructed to call 911 with any severe reactions post vaccine: Marland Kitchen Difficulty breathing  . Swelling of face and throat  . A fast heartbeat  . A bad rash all over body  . Dizziness and weakness   Immunizations Administered    Name Date Dose VIS Date Route   Pfizer COVID-19 Vaccine 03/27/2020  1:38 AM 0.3 mL 12/06/2019 Intramuscular   Manufacturer: [employer], [employer]   Lot: [id number]   [place]: [id number]

## 2020-04-17 ENCOUNTER — Ambulatory Visit: Payer: BC Managed Care – PPO | Attending: Internal Medicine

## 2020-04-17 DIAGNOSIS — Z23 Encounter for immunization: Secondary | ICD-10-CM

## 2020-04-17 NOTE — Progress Notes (Signed)
   Covid-19 Vaccination Clinic  Name:  Harold Smith    MRN: [id number] DOB: Mar 17, 1966  04/17/2020  Mr. Mucklow was observed post Covid-19 immunization for 15 minutes without incident. He was provided with Vaccine Information Sheet and instruction to access the V-Safe system.   Mr. Callanan was instructed to call 911 with any severe reactions post vaccine: Marland Kitchen Difficulty breathing  . Swelling of face and throat  . A fast heartbeat  . A bad rash all over body  . Dizziness and weakness   Immunizations Administered    Name Date Dose VIS Date Route   Pfizer COVID-19 Vaccine 04/17/2020 12:40 PM 0.3 mL 02/19/2019 Intramuscular   Manufacturer: [place]   Lot: [id number]   [place]: [id number]

## 2020-10-09 ENCOUNTER — Telehealth (INDEPENDENT_AMBULATORY_CARE_PROVIDER_SITE_OTHER): Payer: Self-pay | Admitting: Gastroenterology

## 2020-10-09 ENCOUNTER — Other Ambulatory Visit: Payer: Self-pay

## 2020-10-09 DIAGNOSIS — Z1211 Encounter for screening for malignant neoplasm of colon: Secondary | ICD-10-CM

## 2020-10-09 DIAGNOSIS — Z86010 Personal history of colonic polyps: Secondary | ICD-10-CM

## 2020-10-09 DIAGNOSIS — Z800 Family history of malignant neoplasm of digestive organs: Secondary | ICD-10-CM

## 2020-10-09 DIAGNOSIS — S31109A Unspecified open wound of abdominal wall, unspecified quadrant without penetration into peritoneal cavity, initial encounter: Secondary | ICD-10-CM | POA: Insufficient documentation

## 2020-10-09 NOTE — Progress Notes (Signed)
Gastroenterology Pre-Procedure Review  Request Date: Thursday  Requesting Physician: Dr.  Marius Ditch 10/22/20  PATIENT REVIEW QUESTIONS: The patient responded to the following health history questions as indicated:    1. Are you having any GI issues? no 2. Do you have a personal history of Polyps? no 3. Do you have a family history of Colon Cancer or Polyps? yes (father deceased had colon cancer) 4. Diabetes Mellitus? no 5. Joint replacements in the past 12 months?no 6. Major health problems in the past 3 months?no 7. Any artificial heart valves, MVP, or defibrillator?no    MEDICATIONS & ALLERGIES:    Patient reports the following regarding taking any anticoagulation/antiplatelet therapy:   Plavix, Coumadin, Eliquis, Xarelto, Lovenox, Pradaxa, Brilinta, or Effient? no Aspirin? no  Patient confirms/reports the following medications:  Current Outpatient Medications  Medication Sig Dispense Refill  . Cholecalciferol (VITAMIN D3) 10 MCG (400 UNIT) tablet Take by mouth.    . ciclopirox (PENLAC) 8 % solution ciclopirox 8 % topical solution    . Cyanocobalamin (VITAMIN B12 PO) Take by mouth.    . diclofenac Sodium (VOLTAREN) 1 % GEL Voltaren 1 % topical gel    . ferrous sulfate 325 (65 FE) MG EC tablet Take 325 mg by mouth daily with breakfast.    . folic acid (FOLVITE) 1 MG tablet Take by mouth.    . multivitamin (ONE-A-DAY MEN'S) TABS tablet Take 1 tablet by mouth daily.    . phentermine (ADIPEX-P) 37.5 MG tablet Take 37.5 mg by mouth daily.    Marland Kitchen topiramate (TOPAMAX) 100 MG tablet Take 100 mg by mouth daily. (Patient not taking: Reported on 10/09/2020)    . zolpidem (AMBIEN) 10 MG tablet TAKE ONE TABLET (10 MG) BY MOUTH AT BEDTIME AS NEEDED FOR SLEEP (Patient not taking: Reported on 10/09/2020) 30 tablet 0   No current facility-administered medications for this visit.    Patient confirms/reports the following allergies:  Allergies  Allergen Reactions  . Other Other (See Comments)     No orders of the defined types were placed in this encounter.   AUTHORIZATION INFORMATION Primary Insurance: 1D#: Group #:  Secondary Insurance: 1D#: Group #:  SCHEDULE INFORMATION: Date: Thursday 10/22/20 Time: Location:[facility]

## 2020-10-15 ENCOUNTER — Other Ambulatory Visit: Payer: Self-pay

## 2020-10-15 ENCOUNTER — Encounter: Payer: Self-pay | Admitting: Gastroenterology

## 2020-10-16 ENCOUNTER — Other Ambulatory Visit: Payer: Self-pay

## 2020-10-16 MED ORDER — PEG 3350-KCL-NA BICARB-NACL 420 G PO SOLR: 4000.0000 mL | Freq: Once | ORAL | 0 refills | Status: AC

## 2020-10-19 ENCOUNTER — Telehealth: Payer: Self-pay

## 2020-10-19 NOTE — Telephone Encounter (Signed)
Returned patients call. Patient asked if he could have Gatorade with his prep. Informed patient it would be best with water since prep contains flavor pack. Pt verbalized understanding.

## 2020-10-20 ENCOUNTER — Other Ambulatory Visit: Payer: BC Managed Care – PPO

## 2020-10-20 ENCOUNTER — Other Ambulatory Visit
Admission: RE | Admit: 2020-10-20 | Discharge: 2020-10-20 | Disposition: A | Discharge status: Home / Self Care | Payer: BC Managed Care – PPO | Source: Ambulatory Visit | Attending: Gastroenterology | Admitting: Gastroenterology

## 2020-10-20 DIAGNOSIS — Z20822 Contact with and (suspected) exposure to covid-19: Secondary | ICD-10-CM | POA: Diagnosis not present

## 2020-10-20 DIAGNOSIS — Z01812 Encounter for preprocedural laboratory examination: Secondary | ICD-10-CM | POA: Diagnosis not present

## 2020-10-20 LAB — SARS CORONAVIRUS 2 (TAT 6-24 HRS): SARS Coronavirus 2: NEGATIVE

## 2020-10-21 NOTE — Discharge Instructions (Signed)
General Anesthesia, Adult, Care After This sheet gives you information about how to care for yourself after your procedure. Your health care provider may also give you more specific instructions. If you have problems or questions, contact your health care provider. What can I expect after the procedure? After the procedure, the following side effects are common:  Pain or discomfort at the IV site.  Nausea.  Vomiting.  Sore throat.  Trouble concentrating.  Feeling cold or chills.  Weak or tired.  Sleepiness and fatigue.  Soreness and body aches. These side effects can affect parts of the body that were not involved in surgery. Follow these instructions at home:  For at least 24 hours after the procedure:  Have a responsible adult stay with you. It is important to have someone help care for you until you are awake and alert.  Rest as needed.  Do not: ? Participate in activities in which you could fall or become injured. ? Drive. ? Use heavy machinery. ? Drink alcohol. ? Take sleeping pills or medicines that cause drowsiness. ? Make important decisions or sign legal documents. ? Take care of children on your own. Eating and drinking  Follow any instructions from your health care provider about eating or drinking restrictions.  When you feel hungry, start by eating small amounts of foods that are soft and easy to digest (bland), such as toast. Gradually return to your regular diet.  Drink enough fluid to keep your urine pale yellow.  If you vomit, rehydrate by drinking water, juice, or clear broth. General instructions  If you have sleep apnea, surgery and certain medicines can increase your risk for breathing problems. Follow instructions from your health care provider about wearing your sleep device: ? Anytime you are sleeping, including during daytime naps. ? While taking prescription pain medicines, sleeping medicines, or medicines that make you drowsy.  Return to  your normal activities as told by your health care provider. Ask your health care provider what activities are safe for you.  Take over-the-counter and prescription medicines only as told by your health care provider.  If you smoke, do not smoke without supervision.  Keep all follow-up visits as told by your health care provider. This is important. Contact a health care provider if:  You have nausea or vomiting that does not get better with medicine.  You cannot eat or drink without vomiting.  You have pain that does not get better with medicine.  You are unable to pass urine.  You develop a skin rash.  You have a fever.  You have redness around your IV site that gets worse. Get help right away if:  You have difficulty breathing.  You have chest pain.  You have blood in your urine or stool, or you vomit blood. Summary  After the procedure, it is common to have a sore throat or nausea. It is also common to feel tired.  Have a responsible adult stay with you for the first 24 hours after general anesthesia. It is important to have someone help care for you until you are awake and alert.  When you feel hungry, start by eating small amounts of foods that are soft and easy to digest (bland), such as toast. Gradually return to your regular diet.  Drink enough fluid to keep your urine pale yellow.  Return to your normal activities as told by your health care provider. Ask your health care provider what activities are safe for you. This information is not   intended to replace advice given to you by your health care provider. Make sure you discuss any questions you have with your health care provider. Document Revised: 12/15/2017 Document Reviewed: 07/28/2017 Elsevier Patient Education  2020 Elsevier Inc.  

## 2020-10-22 ENCOUNTER — Encounter
Admission: RE | Disposition: A | Discharge status: Home / Self Care | Payer: Self-pay | Source: Home / Self Care | Attending: Gastroenterology

## 2020-10-22 ENCOUNTER — Ambulatory Visit
Admission: RE | Admit: 2020-10-22 | Discharge: 2020-10-22 | Disposition: A | Discharge status: Home / Self Care | Payer: BC Managed Care – PPO | Attending: Gastroenterology | Admitting: Gastroenterology

## 2020-10-22 ENCOUNTER — Telehealth: Payer: Self-pay

## 2020-10-22 ENCOUNTER — Encounter: Payer: Self-pay | Admitting: Gastroenterology

## 2020-10-22 ENCOUNTER — Ambulatory Visit: Payer: BC Managed Care – PPO | Admitting: Anesthesiology

## 2020-10-22 ENCOUNTER — Other Ambulatory Visit: Payer: Self-pay

## 2020-10-22 DIAGNOSIS — D124 Benign neoplasm of descending colon: Secondary | ICD-10-CM | POA: Diagnosis not present

## 2020-10-22 DIAGNOSIS — Z9884 Bariatric surgery status: Secondary | ICD-10-CM | POA: Insufficient documentation

## 2020-10-22 DIAGNOSIS — D123 Benign neoplasm of transverse colon: Secondary | ICD-10-CM | POA: Insufficient documentation

## 2020-10-22 DIAGNOSIS — Z86010 Personal history of colonic polyps: Secondary | ICD-10-CM

## 2020-10-22 DIAGNOSIS — Z1211 Encounter for screening for malignant neoplasm of colon: Secondary | ICD-10-CM | POA: Diagnosis not present

## 2020-10-22 DIAGNOSIS — D125 Benign neoplasm of sigmoid colon: Secondary | ICD-10-CM | POA: Diagnosis not present

## 2020-10-22 DIAGNOSIS — D120 Benign neoplasm of cecum: Secondary | ICD-10-CM | POA: Diagnosis not present

## 2020-10-22 DIAGNOSIS — K635 Polyp of colon: Secondary | ICD-10-CM | POA: Diagnosis not present

## 2020-10-22 DIAGNOSIS — Z09 Encounter for follow-up examination after completed treatment for conditions other than malignant neoplasm: Secondary | ICD-10-CM | POA: Diagnosis not present

## 2020-10-22 DIAGNOSIS — Z800 Family history of malignant neoplasm of digestive organs: Secondary | ICD-10-CM | POA: Insufficient documentation

## 2020-10-22 DIAGNOSIS — D122 Benign neoplasm of ascending colon: Secondary | ICD-10-CM | POA: Diagnosis not present

## 2020-10-22 HISTORY — PX: COLONOSCOPY WITH PROPOFOL: SHX5780

## 2020-10-22 HISTORY — PX: POLYPECTOMY: SHX5525

## 2020-10-22 SURGERY — COLONOSCOPY WITH PROPOFOL
Anesthesia: General | Site: Rectum

## 2020-10-22 MED ORDER — GLYCOPYRROLATE 0.2 MG/ML IJ SOLN
INTRAMUSCULAR | Status: DC | PRN
  Administered 2020-10-22: .2 mg via INTRAVENOUS

## 2020-10-22 MED ORDER — LACTATED RINGERS IV SOLN: INTRAVENOUS | Status: DC

## 2020-10-22 MED ORDER — ACETAMINOPHEN 325 MG PO TABS: 325.0000 mg | ORAL_TABLET | Freq: Once | ORAL | Status: DC

## 2020-10-22 MED ORDER — LIDOCAINE HCL (CARDIAC) PF 100 MG/5ML IV SOSY
PREFILLED_SYRINGE | INTRAVENOUS | Status: DC | PRN
  Administered 2020-10-22: 50 mg via INTRAVENOUS

## 2020-10-22 MED ORDER — PROPOFOL 10 MG/ML IV BOLUS
INTRAVENOUS | Status: DC | PRN
  Administered 2020-10-22: 50 mg via INTRAVENOUS
  Administered 2020-10-22: 40 mg via INTRAVENOUS
  Administered 2020-10-22 (×2): 50 mg via INTRAVENOUS
  Administered 2020-10-22: 100 mg via INTRAVENOUS
  Administered 2020-10-22: 40 mg via INTRAVENOUS
  Administered 2020-10-22: 50 mg via INTRAVENOUS

## 2020-10-22 MED ORDER — ACETAMINOPHEN 160 MG/5ML PO SOLN: 325.0000 mg | Freq: Once | ORAL | Status: DC

## 2020-10-22 MED ORDER — STERILE WATER FOR IRRIGATION IR SOLN
Status: DC | PRN
  Administered 2020-10-22: .05 mL

## 2020-10-22 MED ORDER — GLUCAGON HCL RDNA (DIAGNOSTIC) 1 MG IJ SOLR
INTRAMUSCULAR | Status: DC | PRN
  Administered 2020-10-22: .25 mg via INTRAVENOUS

## 2020-10-22 SURGICAL SUPPLY — 11 items
ELECT REM PT RETURN 9FT ADLT (ELECTROSURGICAL) ×3
ELECTRODE REM PT RTRN 9FT ADLT (ELECTROSURGICAL) ×2 IMPLANT
GOWN CVR UNV OPN BCK APRN NK (MISCELLANEOUS) ×4 IMPLANT
GOWN ISOL THUMB LOOP REG UNIV (MISCELLANEOUS) ×6
KIT PRC NS LF DISP ENDO (KITS) ×2 IMPLANT
KIT PROCEDURE OLYMPUS (KITS) ×3
MANIFOLD NEPTUNE II (INSTRUMENTS) ×3 IMPLANT
SNARE COLD EXACTO (MISCELLANEOUS) ×3 IMPLANT
SNARE LASSO HEX 3 IN 1 (INSTRUMENTS) ×3 IMPLANT
TRAP ETRAP POLY (MISCELLANEOUS) ×3 IMPLANT
WATER STERILE IRR 250ML POUR (IV SOLUTION) ×3 IMPLANT

## 2020-10-22 NOTE — Telephone Encounter (Signed)
Placed referral per Dr. Marius Ditch orders

## 2020-10-22 NOTE — H&P (Signed)
[name], MD [address]  [place]  [place], [place] [address]  Main: [phone]  Fax: [phone] Pager: [phone]  Primary Care Physician:  [name], MD Primary Gastroenterologist:  Dr. [name]  Pre-Procedure History & Physical: HPI:  Harold Smith is a 54 y.o. male is here for an colonoscopy.   Past Medical History:  Diagnosis Date  . Allergic rhinitis due to pollen   . Colon polyps   . Diverticulitis large intestine   . Obesity   . Osteoarthritis, knee   . Sleep apnea    does not use cpap  . Sleep disturbance     Past Surgical History:  Procedure Laterality Date  . ACHILLES TENDON REPAIR Right 2008  . COLON RESECTION Left 2006   due to diverticular disease at [facility]  . COLONOSCOPY  2013?  . COLONOSCOPY WITH PROPOFOL N/A 05/22/2018   Procedure: COLONOSCOPY WITH PROPOFOL;  Surgeon: Lin Landsman, MD;  Location: [facility] ENDOSCOPY;  Service: Gastroenterology;  Laterality: N/A;  . DRUG INDUCED ENDOSCOPY N/A 06/10/2019   Procedure: DRUG INDUCED SLEEP ENDOSCOPY;  Surgeon: Jerrell Belfast, MD;  Location: [facility];  Service: ENT;  Laterality: N/A;  . GASTRIC BYPASS N/A 2009  . HIATAL HERNIA REPAIR  10/15   at [facility]  . 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 Authorizing Provider  Cholecalciferol (VITAMIN D3) 10 MCG (400 UNIT) tablet Take by mouth. Patient not taking: Reported on 10/15/2020    [provider]  ciclopirox (PENLAC) 8 % solution ciclopirox 8 % topical solution Patient not taking: ciclopirox 8 % topical solution    [provider]  Cyanocobalamin (VITAMIN B12 PO) Take by mouth. Patient not taking: Reported on 10/15/2020    [provider]  diclofenac Sodium (VOLTAREN) 1 % GEL Voltaren 1 % topical gel Patient not taking: No sig reported    [provider]  ferrous sulfate 325 (65 FE)  MG EC tablet Take 325 mg by mouth daily with breakfast. Patient not taking: Reported on 10/15/2020    [provider]  folic acid (FOLVITE) 1 MG tablet Take by mouth. Patient not taking: Reported on 10/15/2020    [provider]  multivitamin (ONE-A-DAY MEN'S) TABS tablet Take 1 tablet by mouth daily. Patient not taking: Reported on 10/15/2020    [provider]  phentermine (ADIPEX-P) 37.5 MG tablet Take 37.5 mg by mouth daily. Patient not taking: Reported on 10/15/2020 09/01/20   [provider]  topiramate (TOPAMAX) 100 MG tablet Take 100 mg by mouth daily. Patient not taking: Reported on 10/09/2020 08/26/20   [provider]  zolpidem (AMBIEN) 10 MG tablet TAKE ONE TABLET (10 MG) BY MOUTH AT BEDTIME AS NEEDED FOR SLEEP Patient not taking: Reported on 10/09/2020 10/14/19   [name], MD    Allergies as of 10/09/2020 - Review Complete 10/09/2020  Allergen Reaction Noted  . Other Other (See Comments) 05/28/2019    Family History  Problem Relation Age of Onset  . Hypertension Mother   . Diabetes Mother   . Kidney disease Mother   . Heart disease Mother   . Cancer Mother   . Colon cancer Mother   . Cancer Father 57       rectal cancer  . Asthma Sister   . Obesity Sister   . Stroke Sister   . Obesity Brother   .  Diabetes Other   . Heart disease Other   . Hypertension Other   . Kidney disease Other     Social History   Socioeconomic History  . Marital status: Single    Spouse name: Not on file  . Number of children: 3  . Years of education: Not on file  . Highest education level: Not on file  Occupational History  . Occupation: Production designer, theatre/television/film  Tobacco Use  . Smoking status: Never Smoker  . Smokeless tobacco: Never Used  . Tobacco comment: occasional cigar  Vaping Use  . Vaping Use: Never used  Substance and Sexual Activity  . Alcohol use: Yes    Alcohol/week: 0.0 standard drinks    Comment: occasional  .  Drug use: No  . Sexual activity: Yes  Other Topics Concern  . Not on file  Social History Narrative   Lives with 2 daughters from 43st marriage   Shared custody of another daughter from 36nd marriage   Social Determinants of Health   Financial Resource Strain:   . Difficulty of Paying Living Expenses: Not on file  Food Insecurity:   . Worried About Charity fundraiser in the Last Year: Not on file  . Ran Out of Food in the Last Year: Not on file  Transportation Needs:   . Lack of Transportation (Medical): Not on file  . Lack of Transportation (Non-Medical): Not on file  Physical Activity:   . Days of Exercise per Week: Not on file  . Minutes of Exercise per Session: Not on file  Stress:   . Feeling of Stress : Not on file  Social Connections:   . Frequency of Communication with Friends and Family: Not on file  . Frequency of Social Gatherings with Friends and Family: Not on file  . Attends Religious Services: Not on file  . Active Member of Clubs or Organizations: Not on file  . Attends Archivist Meetings: Not on file  . Marital Status: Not on file  Intimate Partner Violence:   . Fear of Current or Ex-Partner: Not on file  . Emotionally Abused: Not on file  . Physically Abused: Not on file  . Sexually Abused: Not on file    Review of Systems: See HPI, otherwise negative ROS  Physical Exam: BP (!) 171/108   Pulse 75   Temp (!) 97.3 F (36.3 C) (Temporal)   Ht 5\' 10"  (1.778 m)   Wt 92.1 kg   SpO2 100%   BMI 29.13 kg/m  General:   Alert,  pleasant and cooperative in NAD Head:  Normocephalic and atraumatic. Neck:  Supple; no masses or thyromegaly. Lungs:  Clear throughout to auscultation.    Heart:  Regular rate and rhythm. Abdomen:  Soft, nontender and nondistended. Normal bowel sounds, without guarding, and without rebound.   Neurologic:  Alert and  oriented x4;  grossly normal neurologically.  Impression/Plan: Harold Smith is here for an  colonoscopy to be performed for h/o colon polyps  Risks, benefits, limitations, and alternatives regarding  colonoscopy have been reviewed with the patient.  Questions have been answered.  All parties agreeable.   [name], MD  10/22/2020, 8:20 AM

## 2020-10-22 NOTE — Telephone Encounter (Signed)
-----   Message from Lin Landsman, MD sent at 10/22/2020 11:07 AM EDT ----- Regarding: Referral to genetics H/o adenomas colon >10 Referral to genetics  Thanks RV

## 2020-10-22 NOTE — Anesthesia Procedure Notes (Signed)
Procedure Name: General with mask airway Date/Time: 10/22/2020 9:18 AM Performed by: Jeannene Patella, CRNA Pre-anesthesia Checklist: Patient identified, Emergency Drugs available, Suction available, Timeout performed and Patient being monitored Patient Re-evaluated:Patient Re-evaluated prior to induction Oxygen Delivery Method: Nasal cannula Placement Confirmation: positive ETCO2

## 2020-10-22 NOTE — Transfer of Care (Signed)
Immediate Anesthesia Transfer of Care Note  Patient: Korion Cuevas  Procedure(s) Performed: COLONOSCOPY WITH PROPOFOL (N/A Rectum) POLYPECTOMY (Rectum)  Patient Location: PACU  Anesthesia Type: General  Level of Consciousness: awake, alert  and patient cooperative  Airway and Oxygen Therapy: Patient Spontanous Breathing and Patient connected to supplemental oxygen  Post-op Assessment: Post-op Vital signs reviewed, Patient's Cardiovascular Status Stable, Respiratory Function Stable, Patent Airway and No signs of Nausea or vomiting  Post-op Vital Signs: Reviewed and stable  Complications: No complications documented.

## 2020-10-22 NOTE — Op Note (Signed)
[facility] Gastroenterology Patient Name: Harold Smith Procedure Date: 10/22/2020 9:03 AM MRN: [id number] Account #: [id number] Date of Birth: Sep 06, 1966 Admit Type: Outpatient Age: 54 Room: [facility] OR ROOM 01 Gender: Male Note Status: Finalized Procedure:             Colonoscopy Indications:           Surveillance: History of numerous (> 10) adenomas on                         last colonoscopy (< 3 yrs), Last colonoscopy: May 2019 Providers:             Lin Landsman MD, MD Referring MD:          [name] (Referring MD) Medicines:             General Anesthesia Complications:         No immediate complications. Estimated blood loss:                         Minimal. Procedure:             Pre-Anesthesia Assessment:                        - Prior to the procedure, a History and Physical was                         performed, and patient medications and allergies were                         reviewed. The patient is competent. The risks and                         benefits of the procedure and the sedation options and                         risks were discussed with the patient. All questions                         were answered and informed consent was obtained.                         Patient identification and proposed procedure were                         verified by the physician, the nurse, the                         anesthesiologist, the anesthetist and the technician                         in the pre-procedure area in the procedure room in the                         endoscopy suite. Mental Status Examination: alert and                         oriented. Airway Examination: normal oropharyngeal  airway and neck mobility. Respiratory Examination:                         clear to auscultation. CV Examination: normal.                         Prophylactic Antibiotics: The patient does not require                          prophylactic antibiotics. Prior Anticoagulants: The                         patient has taken no previous anticoagulant or                         antiplatelet agents. ASA Grade Assessment: III - A                         patient with severe systemic disease. After reviewing                         the risks and benefits, the patient was deemed in                         satisfactory condition to undergo the procedure. The                         anesthesia plan was to use general anesthesia.                         Immediately prior to administration of medications,                         the patient was re-assessed for adequacy to receive                         sedatives. The heart rate, respiratory rate, oxygen                         saturations, blood pressure, adequacy of pulmonary                         ventilation, and response to care were monitored                         throughout the procedure. The physical status of the                         patient was re-assessed after the procedure.                        After obtaining informed consent, the colonoscope was                         passed under direct vision. Throughout the procedure,                         the patient's blood pressure, pulse, and oxygen  saturations were monitored continuously. The                         Colonoscope was introduced through the anus and                         advanced to the the cecum, identified by appendiceal                         orifice and ileocecal valve. The colonoscopy was                         unusually difficult due to inadequate bowel prep.                         Successful completion of the procedure was aided by                         applying abdominal pressure and lavage. The patient                         tolerated the procedure well. The quality of the bowel                         preparation was evaluated using the BBPS [facility] Bowel                          Preparation Scale) with scores of: Right Colon = 1                         (portion of mucosa seen, but other areas not well seen                         due to staining, residual stool and/or opaque liquid),                         Transverse Colon = 2 (minor amount of residual                         staining, small fragments of stool and/or opaque                         liquid, but mucosa seen well) and Left Colon = 2                         (minor amount of residual staining, small fragments of                         stool and/or opaque liquid, but mucosa seen well). The                         total BBPS score equals 5. Findings:      The perianal and digital rectal examinations were normal. Pertinent       negatives include normal sphincter tone and no palpable rectal lesions.      Multiple sessile polyps were found in the sigmoid colon 1, descending  colon 2, transverse colon 5, ascending colon 4 and cecum 1. The polyps       were 4 to 8 mm in size. These polyps were removed with a cold snare.       Resection and retrieval were complete.      A 12 mm polyp was found in the descending colon. The polyp was sessile.       The polyp was removed with a hot snare. Resection and retrieval were       complete.      Copious quantities of semi-liquid stool was found in the entire colon,       precluding visualization. Lavage of the area was performed using 200 -       500 mL of sterile water, resulting in clearance with fair visualization.      The retroflexed view of the distal rectum and anal verge was normal and       showed no anal or rectal abnormalities. Impression:            - Multiple 4 to 8 mm polyps in the sigmoid colon, in                         the descending colon, in the transverse colon, in the                         ascending colon and in the cecum, removed with a cold                         snare. Resected and retrieved.                         - One 12 mm polyp in the descending colon, removed                         with a hot snare. Resected and retrieved.                        - Stool in the entire examined colon.                        - The distal rectum and anal verge are normal on                         retroflexion view. Recommendation:        - Discharge patient to home (with escort).                        - Resume previous diet today.                        - Continue present medications.                        - Await pathology results.                        - Repeat colonoscopy in 1 year for surveillance of                         multiple polyps with 2 day  prep.                        - Refer to a genetics counselor at appointment to be                         scheduled. Procedure Code(s):     --- Professional ---                        [id number], Colonoscopy, flexible; with removal of                         tumor(s), polyp(s), or other lesion(s) by snare                         technique Diagnosis Code(s):     --- Professional ---                        K63.5, Polyp of colon                        Z86.010, Personal history of colonic polyps CPT copyright 2019 American Medical Association. All rights reserved. The codes documented in this report are preliminary and upon coder review may  be revised to meet current compliance requirements. Dr. Ulyess Mort Lin Landsman MD, MD 10/22/2020 10:09:54 AM This report has been signed electronically. Number of Addenda: 0 Note Initiated On: 10/22/2020 9:03 AM Scope Withdrawal Time: 0 hours 42 minutes 21 seconds  Total Procedure Duration: 0 hours 46 minutes 55 seconds  Estimated Blood Loss:  Estimated blood loss was minimal.      [facility]

## 2020-10-22 NOTE — Anesthesia Preprocedure Evaluation (Signed)
Anesthesia Evaluation  Patient identified by MRN, date of birth, ID band Patient awake    Reviewed: Allergy & Precautions, H&P , NPO status , Patient's Chart, lab work & pertinent test results  Airway Mallampati: II  TM Distance: >3 FB Neck ROM: full    Dental no notable dental hx.    Pulmonary sleep apnea , Patient abstained from smoking.,    Pulmonary exam normal breath sounds clear to auscultation       Cardiovascular hypertension, Normal cardiovascular exam Rhythm:regular Rate:Normal     Neuro/Psych    GI/Hepatic   Endo/Other    Renal/GU      Musculoskeletal   Abdominal   Peds  Hematology   Anesthesia Other Findings   Reproductive/Obstetrics                             Anesthesia Physical Anesthesia Plan  ASA: II  Anesthesia Plan: General   Post-op Pain Management:    Induction: Intravenous  PONV Risk Score and Plan: 2 and Treatment may vary due to age or medical condition, Propofol infusion and TIVA  Airway Management Planned: Natural Airway  Additional Equipment:   Intra-op Plan:   Post-operative Plan:   Informed Consent: I have reviewed the patients History and Physical, chart, labs and discussed the procedure including the risks, benefits and alternatives for the proposed anesthesia with the patient or authorized representative who has indicated his/her understanding and acceptance.     Dental Advisory Given  Plan Discussed with: CRNA  Anesthesia Plan Comments:         Anesthesia Quick Evaluation

## 2020-10-22 NOTE — Anesthesia Postprocedure Evaluation (Signed)
Anesthesia Post Note  Patient: Harold Smith  Procedure(s) Performed: COLONOSCOPY WITH PROPOFOL (N/A Rectum) POLYPECTOMY (Rectum)     Patient location during evaluation: PACU Anesthesia Type: General Level of consciousness: awake and alert and oriented Pain management: satisfactory to patient Vital Signs Assessment: post-procedure vital signs reviewed and stable Respiratory status: spontaneous breathing, nonlabored ventilation and respiratory function stable Cardiovascular status: blood pressure returned to baseline and stable Postop Assessment: Adequate PO intake and No signs of nausea or vomiting Anesthetic complications: no   No complications documented.  Raliegh Ip

## 2020-10-23 ENCOUNTER — Encounter: Payer: Self-pay | Admitting: Gastroenterology

## 2020-10-23 LAB — SURGICAL PATHOLOGY

## 2020-10-26 ENCOUNTER — Encounter: Payer: Self-pay | Admitting: Gastroenterology

## 2020-11-03 ENCOUNTER — Inpatient Hospital Stay: Payer: BC Managed Care – PPO

## 2020-11-03 ENCOUNTER — Other Ambulatory Visit: Payer: Self-pay

## 2020-11-03 ENCOUNTER — Inpatient Hospital Stay: Payer: BC Managed Care – PPO | Attending: Oncology | Admitting: Licensed Clinical Social Worker

## 2020-11-03 ENCOUNTER — Encounter: Payer: Self-pay | Admitting: Gastroenterology

## 2020-11-03 ENCOUNTER — Encounter: Payer: Self-pay | Admitting: Licensed Clinical Social Worker

## 2020-11-03 DIAGNOSIS — Z86010 Personal history of colonic polyps: Secondary | ICD-10-CM

## 2020-11-03 DIAGNOSIS — Z800 Family history of malignant neoplasm of digestive organs: Secondary | ICD-10-CM | POA: Diagnosis not present

## 2020-11-03 NOTE — Progress Notes (Signed)
REFERRING PROVIDER: Lin Landsman, MD [address] [place],  [place] [address]  PRIMARY PROVIDER:  [name], MD  PRIMARY REASON FOR VISIT:  1. Family history of colon cancer   2. History of colonic polyps      HISTORY OF PRESENT ILLNESS:   [name], a 54 y.o. male, was seen for a [place] cancer genetics consultation at the request of Dr. Marius Smith due to a personal history of colon polyps and family history of colon cancer.  [name] presents to clinic today to discuss the possibility of a hereditary predisposition to cancer, genetic testing, and to further clarify his future cancer risks, as well as potential cancer risks for family members.    [name] is a 54 y.o. male with no personal history of cancer.  He reports having 4 or 5 total colonoscopies. He reports having a few on his first colonoscopies which are not available in his chart for review. In 2015, he had 3 polyps removed, all were tubular adenomas. 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   CANCER HISTORY:  Oncology History   No history exists.    Past Medical History:  Diagnosis Date  . Allergic rhinitis due to pollen   . Colon polyps   . Diverticulitis large intestine   . Obesity   . Osteoarthritis, knee   . Sleep apnea    does not use cpap  . Sleep disturbance     Past Surgical History:  Procedure Laterality Date  . ACHILLES TENDON REPAIR Right 2008  . COLON RESECTION Left 2006   due to diverticular disease at [facility]  . COLONOSCOPY  2013?  . COLONOSCOPY WITH PROPOFOL N/A 05/22/2018   Procedure: COLONOSCOPY WITH PROPOFOL;  Surgeon: Lin Landsman, MD;  Location: [facility] ENDOSCOPY;  Service: Gastroenterology;  Laterality: N/A;  . COLONOSCOPY WITH PROPOFOL N/A 10/22/2020   Procedure: COLONOSCOPY WITH PROPOFOL;  Surgeon: Lin Landsman, MD;  Location: [place];  Service: Endoscopy;  Laterality: N/A;  priority 4  . DRUG INDUCED ENDOSCOPY N/A 06/10/2019   Procedure: DRUG INDUCED SLEEP ENDOSCOPY;  Surgeon: Jerrell Belfast, MD;  Location: [place];  Service: ENT;  Laterality: N/A;  . GASTRIC BYPASS N/A 2009  . HIATAL HERNIA REPAIR  10/15   at [facility]  . POLYPECTOMY  10/22/2020   Procedure: POLYPECTOMY;  Surgeon: Lin Landsman, MD;  Location: [place];  Service: Endoscopy;;  . 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 Marital status: Single    Spouse name: Not on file  . Number of children: 3  . Years of education: Not on file  . Highest education level: Not on file  Occupational History  . Occupation: Production designer, theatre/television/film  Tobacco Use  . Smoking status: Never Smoker  . Smokeless tobacco: Never Used  . Tobacco comment: occasional cigar  Vaping Use  . Vaping Use: Never used  Substance and Sexual Activity  . Alcohol use: Yes    Alcohol/week: 0.0 standard drinks    Comment: occasional  . Drug use: No  . Sexual activity: Yes  Other Topics Concern  . Not on file  Social History Narrative   Lives with 2 daughters from 6st marriage   Shared custody of another daughter from 76nd marriage  Social Determinants of Health   Financial Resource Strain:   . Difficulty of Paying Living Expenses: Not on file  Food Insecurity:   . Worried About Charity fundraiser in the Last Year: Not on file  . Ran Out of Food in the Last Year: Not on file  Transportation Needs:   . Lack of Transportation (Medical): Not on file  . Lack of Transportation (Non-Medical): Not on file  Physical Activity:   . Days of Exercise per Week: Not on file  . Minutes of Exercise per Session: Not on file  Stress:   . Feeling of Stress : Not on file  Social Connections:   . Frequency of Communication with Friends and Family: Not on file  . Frequency of Social Gatherings with Friends  and Family: Not on file  . Attends Religious Services: Not on file  . Active Member of Clubs or Organizations: Not on file  . Attends Archivist Meetings: Not on file  . Marital Status: Not on file     FAMILY HISTORY:  We obtained a detailed, 4-generation family history.  Significant diagnoses are listed below: Family History  Problem Relation Age of Onset  . Hypertension Mother   . Diabetes Mother   . Kidney disease Mother   . Heart disease Mother   . Colon cancer Father 26  . Asthma Sister   . Obesity Sister   . Stroke Sister   . Obesity Brother   . Diabetes Other   . Heart disease Other   . Hypertension Other   . Kidney disease Other   . Cancer Paternal Uncle        unk type, possible prostate  . Lung cancer Paternal Grandmother    Mr. Bartelt has 3 daughters. H e has 3 brothers and 1 sister. He does not believe his brothers have had colonoscopies yet, but his sister has and it was reportedly normal.   Mr. Dozier father died of colon cancer. He was diagnosed around age 34 or 74 and passed at 30. Patient has 2 paternal uncles, 2 paternal aunts, all living in their 67s. One uncle has had cancer, possibly prostate. No known cancers in paternal cousins. Paternal grandmother died of lung cancer at 63, she had no history of smoking. Grandfather died at 29 due to heart issues.   Mr. Happ mother is living at 55, no history of cancer. Patient had 10 maternal aunts/uncle, no known cancers on this side of the family. Maternal grandmother died at 58, grandfather died in his 62s due to a work accident.   Mr. Blyth is unaware of previous family history of genetic testing for hereditary cancer risks. There is no reported Ashkenazi Jewish ancestry. There is no known consanguinity.  GENETIC COUNSELING ASSESSMENT: Mr. Sahli is a 54 y.o. male with a personal history of colon polyps which is somewhat suggestive of a hereditary polyposis syndrome and predisposition to cancer. We,  therefore, discussed and recommended the following at today's visit.   DISCUSSION: We discussed that polyps in general are common, however, most people have fewer than 5 lifetime polyps.  When an individual has 10 or more polyps we become concerned about an underlying polyposis syndrome.  The most common hereditary polyposis syndromes are caused by problems in the APC and MUTYH genes, however, more recently, mutations in the [place] and MSH3 genes have been identified in some polyposis families. We discussed that testing is beneficial for several reasons including knowing how to follow individuals for  cancer screenings, and understand if other family members could be at risk for cancer and allow them to undergo genetic testing.   We reviewed the characteristics, features and inheritance patterns of hereditary cancer syndromes. We also discussed genetic testing, including the appropriate family members to test, the process of testing, insurance coverage and turn-around-time for results. We discussed the implications of a negative, positive and/or variant of uncertain significant result. We recommended Mr. Gerwig pursue genetic testing for the [facility] CustomNext+RNA gene panel.   The CustomNext-Cancer + RNAinsight panel  includes sequencing and/or deletion duplication testing of the following 91 genes: AIP, ALK, APC*, ATM*, AXIN2, BAP1, BARD1, BLM, BMPR1A, BRCA1*, BRCA2*, BRIP1*, CDC73, CDH1*, CDK4, CDKN1B, CDKN2A, CHEK2*, CTNNA1, DICER1, FANCC, FH, FLCN, GALNT12, KIF1B, LZTR1, MAX, MEN1, MET, MLH1*, MRE11A, MSH2*, MSH3, MSH6*, MUTYH*, NBN, NF1*, NF2, NTHL1, PALB2*, PHOX2B, PMS2*, POT1, PRKAR1A, PTCH1, PTEN*, RAD50, RAD51C*, RAD51D*, RB1, RECQL, RET, SDHA, SDHAF2, SDHB, SDHC, SDHD, SMAD4, SMARCA4, SMARCB1, SMARCE1, STK11, SUFU, TMEM127, TP53*, TSC1, TSC2, VHL and XRCC2 (sequencing and deletion/duplication); CASR, CFTR, CPA1, CTRC, EGFR, EGLN1, FAM175A, HOXB13, KIT, MITF, MLH3, PALLD, PDGFRA, POLD1, POLE, PRSS1,  RINT1, RPS20, SPINK1 and TERT (sequencing only); EPCAM and GREM1 (deletion/duplication only).   Based on Mr. Laton personal history of colon polyps, he meets medical criteria for genetic testing. Despite that he meets criteria, he may still have an out of pocket cost. We discussed that if his out of pocket cost for testing is over $100, the laboratory will call and confirm whether he wants to proceed with testing.  If the out of pocket cost of testing is less than $100 he will be billed by the genetic testing laboratory.   PLAN: After considering the risks, benefits, and limitations, Mr. Liotta provided informed consent to pursue genetic testing and the blood sample was sent to [facility] for analysis of the CustomNext+RNA panel. Results should be available within approximately 2-3 weeks' time, at which point they will be disclosed by telephone to Mr. Juhnke, as will any additional recommendations warranted by these results. Mr. Cassada will receive a summary of his genetic counseling visit and a copy of his results once available. This information will also be available in Epic.   Mr. Arteaga questions were answered to his satisfaction today. Our contact information was provided should additional questions or concerns arise. Thank you for the referral and allowing [place] to share in the care of your patient.   Faith Rogue, MS, [facility] Genetic Counselor [email or web address] Phone: [phone]  The patient was seen for a total of 25 minutes in face-to-face genetic counseling.  Dr. Grayland Ormond was available for discussion regarding this case.   _______________________________________________________________________ For Office Staff:  Number of people involved in session: 1 Was an Intern/ student involved with case: no

## 2020-11-04 NOTE — Telephone Encounter (Signed)
Called and left a message for call back  

## 2020-11-04 NOTE — Telephone Encounter (Signed)
Patient made appointment on 11/06/2020 at 9:45

## 2020-11-06 ENCOUNTER — Ambulatory Visit: Payer: BC Managed Care – PPO | Admitting: Gastroenterology

## 2020-11-06 ENCOUNTER — Encounter: Payer: Self-pay | Admitting: Gastroenterology

## 2020-11-06 ENCOUNTER — Other Ambulatory Visit: Payer: Self-pay

## 2020-11-06 VITALS — BP 191/113 | HR 74 | Temp 98.2°F | Ht 70.0 in | Wt 210.0 lb

## 2020-11-06 DIAGNOSIS — Z86010 Personal history of colonic polyps: Secondary | ICD-10-CM

## 2020-11-06 DIAGNOSIS — K640 First degree hemorrhoids: Secondary | ICD-10-CM

## 2020-11-06 NOTE — Progress Notes (Signed)
[name], MD [address]  [place]  [place], [place] [address]  Main: [phone]  Fax: [phone]    Gastroenterology Consultation  Referring Provider:     [name], MD Primary Care Physician:  [name], MD Primary Gastroenterologist:  Dr. [name] Reason for Consultation:     Symptomatic hemorrhoids        HPI:   Harold Smith is a 54 y.o. male referred by Dr. [name], MD  for consultation & management of symptomatic hemorrhoids.  Patient reports several years history of hemorrhoidal symptoms including bright red blood per rectum, rectal pressure/swelling, pain, itching, discomfort.  He also had history of thrombosed external hemorrhoid that required lancing.  Patient has tried several over-the-counter hemorrhoidal creams over the last several years with no benefit.  He is a Administrator, involves prolonged sitting He underwent colonoscopies x2, found to have several adenomas of the colon, recently seen by genetics to evaluate for any hereditary cancer syndromes  He also had history of diverticulitis, 25% of the colon removed per patient  NSAIDs: None  Antiplts/Anticoagulants/Anti thrombotics: None  GI Procedures:   Colonoscopy 10/22/2020 - Multiple 4 to 8 mm polyps in the sigmoid colon, in the descending colon, in the transverse colon, in the ascending colon and in the cecum, removed with a cold snare. Resected and retrieved. - One 12 mm polyp in the descending colon, removed with a hot snare. Resected and retrieved. - Stool in the entire examined colon. - The distal rectum and anal verge are normal on retroflexion view. DIAGNOSIS:  A. COLON POLYP, CECUM; COLD SNARE:  - TUBULAR ADENOMA.  - NEGATIVE FOR HIGH-GRADE DYSPLASIA AND MALIGNANCY.   B. COLON POLYPS X4, ASCENDING; COLD SNARE:  - FRAGMENTS (X3) OF TUBULAR ADENOMAS.  - SINGLE FRAGMENT OF BENIGN COLONIC MUCOSA WITH SUPERFICIAL REACTIVE  CHANGES.  - NEGATIVE  FOR HIGH-GRADE DYSPLASIA AND MALIGNANCY.   C. COLON POLYPS X5, TRANSVERSE; COLD SNARE:  - MULTIPLE FRAGMENTS OF TUBULAR ADENOMAS.  - NEGATIVE FOR HIGH-GRADE DYSPLASIA AND MALIGNANCY.   D. COLON POLYPS X2, DESCENDING; HOT SNARE (X1) AND COLD SNARE (X1):  - MULTIPLE FRAGMENTS OF TUBULAR ADENOMAS.  - NEGATIVE FOR HIGH-GRADE DYSPLASIA AND MALIGNANCY.  - LOW-GRADE DYSPLASIA APPEARS FOCALLY PRESENT AT CAUTERIZED POLYP BASE.   E. COLON POLYP, SIGMOID; COLD SNARE:  - TUBULAR ADENOMA.  - NEGATIVE FOR HIGH-GRADE DYSPLASIA AND MALIGNANCY.   Colonoscopy 05/22/2018 - Preparation of the colon was fair. - Ten 5 to 8 mm polyps in the descending colon, in the transverse colon and in the cecum, removed with a hot snare. Resected and retrieved. - Non-bleeding internal hemorrhoids.  DIAGNOSIS:  A. COLON POLYP, CECUM; COLD SNARE:  - FECAL MATERIAL ONLY, NEGATIVE FOR COLONIC MUCOSA.   B. COLON POLYP X2, TRANSVERSE; COLD SNARE:  - TUBULAR ADENOMAS (2).  - NEGATIVE FOR HIGH-GRADE DYSPLASIA AND MALIGNANCY.   C. COLON POLYP X7, DESCENDING; HOT AND COLD SNARE:  - TUBULAR ADENOMAS (MULTIPLE FRAGMENTS).  - NEGATIVE FOR HIGH-GRADE DYSPLASIA AND MALIGNANCY.   Past Medical History:  Diagnosis Date  . Allergic rhinitis due to pollen   . Colon polyps   . Diverticulitis large intestine   . Obesity   . Osteoarthritis, knee   . Sleep apnea    does not use cpap  . Sleep disturbance     Past Surgical History:  Procedure Laterality Date  . ACHILLES TENDON REPAIR Right 2008  . COLON RESECTION Left 2006  due to diverticular disease at [facility]  . COLONOSCOPY  2013?  . COLONOSCOPY WITH PROPOFOL N/A 05/22/2018   Procedure: COLONOSCOPY WITH PROPOFOL;  Surgeon: Lin Landsman, MD;  Location: [facility] ENDOSCOPY;  Service: Gastroenterology;  Laterality: N/A;  . COLONOSCOPY WITH PROPOFOL N/A 10/22/2020   Procedure: COLONOSCOPY WITH PROPOFOL;  Surgeon: Lin Landsman, MD;  Location: [place];   Service: Endoscopy;  Laterality: N/A;  priority 4  . DRUG INDUCED ENDOSCOPY N/A 06/10/2019   Procedure: DRUG INDUCED SLEEP ENDOSCOPY;  Surgeon: Jerrell Belfast, MD;  Location: [place];  Service: ENT;  Laterality: N/A;  . GASTRIC BYPASS N/A 2009  . HIATAL HERNIA REPAIR  10/15   at [facility]  . POLYPECTOMY  10/22/2020   Procedure: POLYPECTOMY;  Surgeon: Lin Landsman, MD;  Location: [place];  Service: Endoscopy;;  . 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   Family History  Problem Relation Age of Onset  . Hypertension Mother   . Diabetes Mother   . Kidney disease Mother   . Heart disease Mother   . Colon cancer Father 8  . Asthma Sister   . Obesity Sister   . Stroke Sister   . Obesity Brother   . Diabetes Other   . Heart disease Other   . Hypertension Other   . Kidney disease Other   . Cancer Paternal Uncle        unk type, possible prostate  . Lung cancer Paternal Grandmother      Social History   Tobacco Use  . Smoking status: Never Smoker  . Smokeless tobacco: Never Used  . Tobacco comment: occasional cigar  Vaping Use  . Vaping Use: Never used  Substance Use Topics  . Alcohol use: Yes    Alcohol/week: 0.0 standard drinks    Comment: occasional  . Drug use: No    Allergies as of 11/06/2020  . (No Known Allergies)    Review of Systems:    All systems reviewed and negative except where noted in HPI.   Physical Exam:  BP (!) 191/113 (BP Location: Left Arm, Patient Position: Sitting, Cuff Size: Normal)   Pulse 74   Temp 98.2 F (36.8 C) (Oral)   Ht 5\' 10"  (1.778 m)   Wt 210 lb (95.3 kg)   BMI 30.13 kg/m  No LMP for male patient.  General:   Alert,  Well-developed, well-nourished, pleasant and cooperative in NAD Head:  Normocephalic and atraumatic. Eyes:  Sclera clear, no icterus.   Conjunctiva pink. Ears:  Normal auditory acuity. Nose:  No deformity, discharge, or  lesions. Mouth:  No deformity or lesions,oropharynx pink & moist. Neck:  Supple; no masses or thyromegaly. Lungs:  Respirations even and unlabored.  Clear throughout to auscultation.   No wheezes, crackles, or rhonchi. No acute distress. Heart:  Regular rate and rhythm; no murmurs, clicks, rubs, or gallops. Abdomen:  Normal bowel sounds. Soft, non-tender and non-distended without masses, hepatosplenomegaly or hernias noted.  No guarding or rebound tenderness.   Rectal: Normal perianal exam, nontender digital rectal exam Msk:  Symmetrical without gross deformities. Good, equal movement & strength bilaterally. Pulses:  Normal pulses noted. Extremities:  No clubbing or edema.  No cyanosis. Neurologic:  Alert and oriented x3;  grossly normal neurologically. Skin:  Intact without significant lesions or rashes. No jaundice. Lymph Nodes:  No significant cervical adenopathy. Psych:  Alert and cooperative. Normal mood and affect.  Imaging Studies: Reviewed  Assessment and Plan:   Harold Smith is a 54 y.o. African-American male with grade 1 symptomatic hemorrhoids  Grade 1 symptomatic hemorrhoids despite medical therapy Today, have discussed regarding hemorrhoid ligation including the procedure, risks and benefits Patient is willing to undergo banding, consent obtained Perform hemorrhoid ligation today  Personal history of several tubular adenomas, >10 Patient is evaluated by genetic counselor, awaiting on molecular genetics Recommend surveillance colonoscopy in 1 year  Follow up in 2 weeks   [name], MD

## 2020-11-06 NOTE — Progress Notes (Signed)
PROCEDURE NOTE: ?The patient presents with symptomatic grade 1 hemorrhoids, unresponsive to maximal medical therapy, requesting rubber band ligation of his/her hemorrhoidal disease.  All risks, benefits and alternative forms of therapy were described and informed consent was obtained. ? ?The decision was made to band the RP internal hemorrhoid, and the [facility] O?Regan System was used to perform band ligation without complication.  Digital anorectal examination was then performed to assure proper positioning of the band, and to adjust the banded tissue as required.  The patient was discharged home without pain or other issues.  Dietary and behavioral recommendations were given and (if necessary - prescriptions were given), along with follow-up instructions.  The patient will return 2 weeks for follow-up and possible additional banding as required. ? ?No complications were encountered and the patient tolerated the procedure well. ? ? ?

## 2020-11-24 ENCOUNTER — Encounter: Payer: Self-pay | Admitting: Licensed Clinical Social Worker

## 2020-11-24 ENCOUNTER — Telehealth: Payer: Self-pay | Admitting: Licensed Clinical Social Worker

## 2020-11-24 ENCOUNTER — Ambulatory Visit: Payer: Self-pay | Admitting: Licensed Clinical Social Worker

## 2020-11-24 DIAGNOSIS — Z1379 Encounter for other screening for genetic and chromosomal anomalies: Secondary | ICD-10-CM

## 2020-11-24 DIAGNOSIS — Z86010 Personal history of colonic polyps: Secondary | ICD-10-CM

## 2020-11-24 DIAGNOSIS — Z800 Family history of malignant neoplasm of digestive organs: Secondary | ICD-10-CM

## 2020-11-24 NOTE — Progress Notes (Signed)
HPI:  [name] was previously seen in the [place] clinic due to a personal history of colon polyps, family history of cancer and concerns regarding a hereditary predisposition to cancer. Please refer to our prior cancer genetics clinic note for more information regarding our discussion, assessment and recommendations, at the time. [name] recent genetic test results were disclosed to him, as were recommendations warranted by these results. These results and recommendations are discussed in more detail below.  [name] is a 54 y.o. male with no personal history of cancer.  He reports having 4 or 5 total colonoscopies. He reports having a few on his first colonoscopies which are not available in his chart for review. In 2015, he had 3 polyps removed, all were tubular adenomas. 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   CANCER HISTORY:  Oncology History   No history exists.    FAMILY HISTORY:  We obtained a detailed, 4-generation family history.  Significant diagnoses are listed below: Family History  Problem Relation Age of Onset  . Hypertension Mother   . Diabetes Mother   . Kidney disease Mother   . Heart disease Mother   . Colon cancer Father 61  . Asthma Sister   . Obesity Sister   . Stroke Sister   . Obesity Brother   . Diabetes Other   . Heart disease Other   . Hypertension Other   . Kidney disease Other   . Cancer Paternal Uncle        unk type, possible prostate  . Lung cancer Paternal Grandmother     [name] has 3 daughters. H e has 3 brothers and 1 sister. He does not believe his brothers have had colonoscopies yet, but his sister has and it was reportedly normal.   [name] father died of colon cancer. He was diagnosed around age 25 or 97 and passed at 76. Patient has 2 paternal uncles, 2 paternal aunts, all living in  their 6s. One uncle has had cancer, possibly prostate. No known cancers in paternal cousins. Paternal grandmother died of lung cancer at 62, she had no history of smoking. Grandfather died at 53 due to heart issues.   [name] mother is living at 68, no history of cancer. Patient had 10 maternal aunts/uncle, no known cancers on this side of the family. Maternal grandmother died at 9, grandfather died in his 83s due to a work accident.   [name] is unaware of previous family history of genetic testing for hereditary cancer risks. There is no reported Ashkenazi Jewish ancestry. There is no known consanguinity.    GENETIC TEST RESULTS: Genetic testing reported out on 11/23/2020 through the [facility] CustomNext+RNA cancer panel found no pathogenic mutations.   The CustomNext-Cancer + RNAinsight panel  includes sequencing and/or deletion duplication testing of the following 91 genes: AIP, ALK, APC*, ATM*, AXIN2, BAP1, BARD1, BLM, BMPR1A, BRCA1*, BRCA2*, BRIP1*, CDC73, CDH1*, CDK4, CDKN1B, CDKN2A, CHEK2*, CTNNA1, DICER1, FANCC, FH, FLCN, GALNT12, KIF1B, LZTR1, MAX, MEN1, MET, MLH1*, MRE11A, MSH2*, MSH3, MSH6*, MUTYH*, NBN, NF1*, NF2, NTHL1, PALB2*, PHOX2B, PMS2*, POT1, PRKAR1A, PTCH1, PTEN*, RAD50, RAD51C*, RAD51D*, RB1, RECQL, RET, SDHA, SDHAF2, SDHB, SDHC, SDHD, SMAD4, SMARCA4, SMARCB1, SMARCE1, STK11, SUFU, TMEM127, TP53*, TSC1, TSC2, VHL and XRCC2 (sequencing and deletion/duplication); CASR, CFTR, CPA1, CTRC, EGFR, EGLN1, FAM175A, HOXB13, KIT, MITF, MLH3, PALLD, PDGFRA, POLD1, POLE, PRSS1,  RINT1, RPS20, SPINK1 and TERT (sequencing only); EPCAM and GREM1 (deletion/duplication only).   The test report has been scanned into EPIC and is located under the Molecular Pathology section of the Results Review tab.  A portion of the result report is included below for reference.     We discussed with [name] that because current genetic testing is not perfect, it is possible there may be a gene mutation  in one of these genes that current testing cannot detect, but that chance is small.  We also discussed, that there could be another gene that has not yet been discovered, or that we have not yet tested, that is responsible for the cancer diagnoses in the family. It is also possible there is a hereditary cause for the cancer in the family that [name] did not inherit and therefore was not identified in his testing.  Therefore, it is important to remain in touch with cancer genetics in the future so that we can continue to offer [name] the most up to date genetic testing.   ADDITIONAL GENETIC TESTING: We discussed with [name] that his genetic testing was fairly extensive.  If there are genes identified to increase cancer risk that can be analyzed in the future, we would be happy to discuss and coordinate this testing at that time.    CANCER SCREENING RECOMMENDATIONS: [name] test result is considered negative (normal).  This means that we have not identified a hereditary cause for his family history of cancer at this time.   While reassuring, this does not definitively rule out a hereditary predisposition to cancer. It is still possible that there could be genetic mutations that are undetectable by current technology. There could be genetic mutations in genes that have not been tested or identified to increase cancer risk.  Therefore, it is recommended he continue to follow the cancer management and screening guidelines provided by his  primary healthcare provider.   This negative genetic test simply tells [place] that we cannot yet define why [name] has had  an increased number of colorectal polyps. [name] medical management and screening should be based on the prospect that he will likely form more colon polyps and should, therefore, undergo more frequent colonoscopy screening at intervals determined by his GI providers.  We also recommended that [name] have an upper endoscopy  periodically.  An individual's cancer risk and medical management are not determined by genetic test results alone. Overall cancer risk assessment incorporates additional factors, including personal medical history, family history, and any available genetic information that may result in a personalized plan for cancer prevention and surveillance.  RECOMMENDATIONS FOR FAMILY MEMBERS:  Relatives in this family might be at some increased risk of developing cancer, over the general population risk, simply due to the family history of cancer.  We recommended male relatives in this family have a yearly mammogram beginning at age 74, or 26 years younger than the earliest onset of cancer, an annual clinical breast exam, and perform monthly breast self-exams. Male relatives in this family should also have a gynecological exam as recommended by their primary provider.  All family members should be referred for colonoscopy starting at age 45.   FOLLOW-UP: Lastly, we discussed with [name] that cancer genetics is a rapidly advancing field and it is possible that new genetic tests will be appropriate for him and/or his family members in the future. We encouraged him to remain in contact with cancer genetics on  an annual basis so we can update his personal and family histories and let him know of advances in cancer genetics that may benefit this family.   Our contact number was provided. [name] questions were answered to his satisfaction, and he knows he is welcome to call us at anytime with additional questions or concerns.   Faith Rogue, MS, [facility] Genetic Counselor [email or web address] Phone: [phone]

## 2020-11-24 NOTE — Telephone Encounter (Signed)
Revealed negative genetic testing.  We discussed that we do not know why he has had colon polyps or why there is cancer in the family. It could be due to a different gene that we are not testing, or something our current technology cannot pick up.  It will be important for him to keep in contact with genetics to learn if additional testing may be needed in the future.  

## 2020-12-11 ENCOUNTER — Ambulatory Visit: Payer: BC Managed Care – PPO | Admitting: Gastroenterology

## 2020-12-11 ENCOUNTER — Encounter: Payer: Self-pay | Admitting: Gastroenterology

## 2020-12-11 ENCOUNTER — Other Ambulatory Visit: Payer: Self-pay

## 2020-12-11 VITALS — BP 159/99 | HR 74 | Temp 98.2°F | Ht 70.0 in | Wt 203.2 lb

## 2020-12-11 DIAGNOSIS — K640 First degree hemorrhoids: Secondary | ICD-10-CM

## 2020-12-11 NOTE — Progress Notes (Addendum)

## 2020-12-24 ENCOUNTER — Other Ambulatory Visit: Payer: Self-pay

## 2020-12-24 ENCOUNTER — Ambulatory Visit: Payer: BC Managed Care – PPO | Admitting: Gastroenterology

## 2020-12-24 DIAGNOSIS — K640 First degree hemorrhoids: Secondary | ICD-10-CM

## 2020-12-24 NOTE — Progress Notes (Signed)

## 2021-03-15 DIAGNOSIS — G4733 Obstructive sleep apnea (adult) (pediatric): Secondary | ICD-10-CM | POA: Diagnosis not present

## 2021-06-10 DIAGNOSIS — H2512 Age-related nuclear cataract, left eye: Secondary | ICD-10-CM | POA: Diagnosis not present

## 2021-06-10 DIAGNOSIS — H5319 Other subjective visual disturbances: Secondary | ICD-10-CM | POA: Diagnosis not present

## 2021-07-02 ENCOUNTER — Ambulatory Visit (INDEPENDENT_AMBULATORY_CARE_PROVIDER_SITE_OTHER): Payer: BC Managed Care – PPO | Admitting: Internal Medicine

## 2021-07-02 ENCOUNTER — Other Ambulatory Visit: Payer: Self-pay

## 2021-07-02 ENCOUNTER — Encounter: Payer: Self-pay | Admitting: Internal Medicine

## 2021-07-02 VITALS — BP 126/84 | HR 74 | Temp 97.6°F | Ht 70.0 in | Wt 194.0 lb

## 2021-07-02 DIAGNOSIS — Z125 Encounter for screening for malignant neoplasm of prostate: Secondary | ICD-10-CM

## 2021-07-02 DIAGNOSIS — Z23 Encounter for immunization: Secondary | ICD-10-CM | POA: Diagnosis not present

## 2021-07-02 DIAGNOSIS — Z0000 Encounter for general adult medical examination without abnormal findings: Secondary | ICD-10-CM | POA: Diagnosis not present

## 2021-07-02 DIAGNOSIS — Z9884 Bariatric surgery status: Secondary | ICD-10-CM

## 2021-07-02 DIAGNOSIS — G479 Sleep disorder, unspecified: Secondary | ICD-10-CM | POA: Diagnosis not present

## 2021-07-02 LAB — CBC
HCT: 33.6 % — ABNORMAL LOW (ref 39.0–52.0)
Hemoglobin: 11 g/dL — ABNORMAL LOW (ref 13.0–17.0)
MCHC: 32.8 g/dL (ref 30.0–36.0)
MCV: 90.2 fl (ref 78.0–100.0)
Platelets: 233 10*3/uL (ref 150.0–400.0)
RBC: 3.72 Mil/uL — ABNORMAL LOW (ref 4.22–5.81)
RDW: 16 % — ABNORMAL HIGH (ref 11.5–15.5)
WBC: 7.4 10*3/uL (ref 4.0–10.5)

## 2021-07-02 LAB — COMPREHENSIVE METABOLIC PANEL
ALT: 23 U/L (ref 0–53)
AST: 19 U/L (ref 0–37)
Albumin: 3.9 g/dL (ref 3.5–5.2)
Alkaline Phosphatase: 198 U/L — ABNORMAL HIGH (ref 39–117)
BUN: 11 mg/dL (ref 6–23)
CO2: 26 mEq/L (ref 19–32)
Calcium: 7.9 mg/dL — ABNORMAL LOW (ref 8.4–10.5)
Chloride: 107 mEq/L (ref 96–112)
Creatinine, Ser: 0.84 mg/dL (ref 0.40–1.50)
GFR: 97.04 mL/min (ref >60.00)
Glucose, Bld: 90 mg/dL (ref 70–99)
Potassium: 4.3 mEq/L (ref 3.5–5.1)
Sodium: 138 mEq/L (ref 135–145)
Total Bilirubin: 0.4 mg/dL (ref 0.2–1.2)
Total Protein: 6.4 g/dL (ref 6.0–8.3)

## 2021-07-02 LAB — VITAMIN B12: Vitamin B-12: 900 pg/mL (ref 211–911)

## 2021-07-02 LAB — PSA: PSA: 0.84 ng/mL (ref 0.10–4.00)

## 2021-07-02 MED ORDER — ZOLPIDEM TARTRATE 10 MG PO TABS
ORAL_TABLET | ORAL | 0 refills | Status: DC
Start: 2021-07-02 — End: 2022-03-31

## 2021-07-02 MED ORDER — HYDROCORTISONE 2.5 % EX CREA: TOPICAL_CREAM | Freq: Three times a day (TID) | CUTANEOUS | 3 refills | Status: DC | PRN

## 2021-07-02 NOTE — Assessment & Plan Note (Signed)
Uses ambien about once a month---will refill

## 2021-07-02 NOTE — Addendum Note (Signed)
Addended by: Pilar Grammes on: 07/02/2021 01:19 PM   Modules accepted: Orders

## 2021-07-02 NOTE — Progress Notes (Signed)
Subjective:    Patient ID: Harold Smith, male    DOB: 1966/09/08, 55 y.o.   MRN: [id number]  HPI Here for physical This visit occurred during the SARS-CoV-2 public health emergency.  Safety protocols were in place, including screening questions prior to the visit, additional usage of staff PPE, and extensive cleaning of exam room while observing appropriate contact time as indicated for disinfecting solutions.   Doing well Still driving truck---local Some aches---will use ibuprofen 800mg  prn  Mostly sleeps okay---occasionally has a problem Uses the zolpidem very rarely---would like a refill  Has seen Dr Marius Ditch for the hemorrhoids Has had banding x 3 Still some discomfort---did have some leftover suppositories  Has scattered painful lesions over his skin---forearms and legs Feels like "something sticking in it" Using acne body wash---but not helping  Stopped his vitamins about a year ago (to see if it would help hemorrhoids)  No current outpatient medications on file prior to visit.   No current facility-administered medications on file prior to visit.    No Known Allergies  Past Medical History:  Diagnosis Date   Allergic rhinitis due to pollen    Colon polyps    Diverticulitis large intestine    Obesity    Osteoarthritis, knee    Sleep apnea    does not use cpap   Sleep disturbance     Past Surgical History:  Procedure Laterality Date   ACHILLES TENDON REPAIR Right 2008   COLON RESECTION Left 2006   due to diverticular disease at [place]   COLONOSCOPY  2013?   COLONOSCOPY WITH PROPOFOL N/A 05/22/2018   Procedure: COLONOSCOPY WITH PROPOFOL;  Surgeon: Lin Landsman, MD;  Location: [facility] ENDOSCOPY;  Service: Gastroenterology;  Laterality: N/A;   COLONOSCOPY WITH PROPOFOL N/A 10/22/2020   Procedure: COLONOSCOPY WITH PROPOFOL;  Surgeon: Lin Landsman, MD;  Location: [place];  Service: Endoscopy;  Laterality: N/A;  priority 4   DRUG INDUCED  ENDOSCOPY N/A 06/10/2019   Procedure: DRUG INDUCED SLEEP ENDOSCOPY;  Surgeon: Jerrell Belfast, MD;  Location: [place];  Service: ENT;  Laterality: N/A;   GASTRIC BYPASS N/A 2009   HIATAL HERNIA REPAIR  10/15   at [place]   POLYPECTOMY  10/22/2020   Procedure: POLYPECTOMY;  Surgeon: Lin Landsman, MD;  Location: [place];  Service: Endoscopy;;   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   revision    Family History  Problem Relation Age of Onset   Hypertension Mother    Diabetes Mother    Kidney disease Mother    Heart disease Mother    Colon cancer Father 45   Asthma Sister    Obesity Sister    Stroke Sister    Obesity Brother    Diabetes Other    Heart disease Other    Hypertension Other    Kidney disease Other    Cancer Paternal Uncle        unk type, possible prostate   Lung cancer Paternal Grandmother     Social History   Socioeconomic History   Marital status: Single    Spouse name: Not on file   Number of children: 3   Years of education: Not on file   Highest education level: Not on file  Occupational History   Occupation: Production designer, theatre/television/film  Tobacco Use   Smoking status: Never   Smokeless tobacco: Never   Tobacco comments:    occasional cigar  Vaping Use   Vaping Use:  Never used  Substance and Sexual Activity   Alcohol use: Yes    Alcohol/week: 0.0 standard drinks    Comment: occasional   Drug use: No   Sexual activity: Yes  Other Topics Concern   Not on file  Social History Narrative   Lives with 2 daughters from 63st marriage   Shared custody of another daughter from 2nd marriage   Social Determinants of Health   Financial Resource Strain: Not on file  Food Insecurity: Not on file  Transportation Needs: Not on file  Physical Activity: Not on file  Stress: Not on file  Social Connections: Not on file  Intimate Partner Violence: Not on file   Review of Systems  Constitutional:  Negative for unexpected weight  change.       Weight down 14# in past 2 years Does exercise regularly Wears seat belt  HENT:  Negative for dental problem, hearing loss and tinnitus.   Eyes:        Has cataract surgery planned for next month  Respiratory:  Negative for cough, chest tightness and shortness of breath.   Cardiovascular:  Negative for chest pain, palpitations and leg swelling.  Gastrointestinal:  Negative for blood in stool and constipation.       No heartburn  Endocrine: Negative for polydipsia and polyuria.  Genitourinary:  Negative for difficulty urinating and urgency.       Had dipstick positive urine at [facility] physical--no hematuria No sexual problems  Musculoskeletal:  Negative for arthralgias, back pain and joint swelling.  Skin:  Positive for rash.  Allergic/Immunologic: Positive for environmental allergies. Negative for immunocompromised state.       Uses flonase or zyrtec in allergy season  Neurological:  Negative for dizziness, syncope, light-headedness and headaches.  Hematological:  Negative for adenopathy. Does not bruise/bleed easily.  Psychiatric/Behavioral:  Negative for dysphoric mood. The patient is not nervous/anxious.        Only rare sleep issues      Objective:   Physical Exam Constitutional:      Appearance: Normal appearance.  HENT:     Right Ear: Tympanic membrane and ear canal normal.     Left Ear: Tympanic membrane and ear canal normal.     Mouth/Throat:     Pharynx: No oropharyngeal exudate or posterior oropharyngeal erythema.  Eyes:     Conjunctiva/sclera: Conjunctivae normal.     Pupils: Pupils are equal, round, and reactive to light.  Cardiovascular:     Rate and Rhythm: Normal rate and regular rhythm.     Pulses: Normal pulses.     Heart sounds: No murmur heard.   No gallop.  Pulmonary:     Effort: Pulmonary effort is normal.     Breath sounds: Normal breath sounds. No wheezing or rales.  Abdominal:     Palpations: Abdomen is soft.     Tenderness: There is no  abdominal tenderness.  Musculoskeletal:     Cervical back: Neck supple.     Right lower leg: No edema.     Left lower leg: No edema.  Lymphadenopathy:     Cervical: No cervical adenopathy.  Skin:    Comments: Multiple hyperpigmented papules on arms and legs (recommended dermatology evaluation)  Neurological:     General: No focal deficit present.     Mental Status: He is alert and oriented to person, place, and time.  Psychiatric:        Mood and Affect: Mood normal.  Behavior: Behavior normal.           Assessment & Plan:

## 2021-07-02 NOTE — Assessment & Plan Note (Signed)
Healthy Will need colonoscopy again in 1-2 years due to multiple polyps Discussed PSA --will check Exercises regularly Will get shingrix Consider the second COVID booster Prefers no flu vaccine

## 2021-07-02 NOTE — Assessment & Plan Note (Signed)
Asked him to to go back on his vitamin regimen

## 2021-07-04 ENCOUNTER — Other Ambulatory Visit: Payer: Self-pay | Admitting: Internal Medicine

## 2021-07-04 DIAGNOSIS — R748 Abnormal levels of other serum enzymes: Secondary | ICD-10-CM

## 2021-07-04 NOTE — Progress Notes (Signed)
pth

## 2021-07-05 ENCOUNTER — Telehealth: Payer: Self-pay

## 2021-07-05 DIAGNOSIS — H2512 Age-related nuclear cataract, left eye: Secondary | ICD-10-CM | POA: Diagnosis not present

## 2021-07-05 NOTE — Telephone Encounter (Signed)
-----   Message from [name], MD sent at 07/04/2021  1:04 PM EDT ----- Results released Set up further testing in the next couple of weeks

## 2021-07-05 NOTE — Telephone Encounter (Signed)
Pt called back and scheduled lab appt for 07-08-21

## 2021-07-05 NOTE — Telephone Encounter (Signed)
Left message to call office to set up lab appt in a few weeks.

## 2021-07-06 ENCOUNTER — Encounter: Payer: Self-pay | Admitting: Ophthalmology

## 2021-07-08 ENCOUNTER — Other Ambulatory Visit: Payer: BC Managed Care – PPO

## 2021-07-08 ENCOUNTER — Other Ambulatory Visit (INDEPENDENT_AMBULATORY_CARE_PROVIDER_SITE_OTHER): Payer: BC Managed Care – PPO

## 2021-07-08 ENCOUNTER — Other Ambulatory Visit: Payer: Self-pay

## 2021-07-08 DIAGNOSIS — R748 Abnormal levels of other serum enzymes: Secondary | ICD-10-CM

## 2021-07-08 LAB — HEPATIC FUNCTION PANEL
ALT: 23 U/L (ref 0–53)
AST: 17 U/L (ref 0–37)
Albumin: 3.8 g/dL (ref 3.5–5.2)
Alkaline Phosphatase: 193 U/L — ABNORMAL HIGH (ref 39–117)
Bilirubin, Direct: 0.2 mg/dL (ref 0.0–0.3)
Total Bilirubin: 0.5 mg/dL (ref 0.2–1.2)
Total Protein: 6 g/dL (ref 6.0–8.3)

## 2021-07-08 LAB — TSH: TSH: 2.49 u[IU]/mL (ref 0.35–5.50)

## 2021-07-08 LAB — GAMMA GT: GGT: 6 U/L — ABNORMAL LOW (ref 7–51)

## 2021-07-09 LAB — PTH, INTACT AND CALCIUM
Calcium: 7.3 mg/dL — ABNORMAL LOW (ref 8.6–10.3)
PTH: 228 pg/mL — ABNORMAL HIGH (ref 16–77)

## 2021-07-11 ENCOUNTER — Other Ambulatory Visit: Payer: Self-pay | Admitting: Internal Medicine

## 2021-07-11 DIAGNOSIS — E211 Secondary hyperparathyroidism, not elsewhere classified: Secondary | ICD-10-CM

## 2021-07-11 NOTE — Progress Notes (Signed)
Consult placed. You should hear about this in the next week or 2

## 2021-07-11 NOTE — Progress Notes (Signed)
endoc

## 2021-07-19 NOTE — Discharge Instructions (Signed)

## 2021-07-20 ENCOUNTER — Ambulatory Visit: Payer: BC Managed Care – PPO | Admitting: Anesthesiology

## 2021-07-20 ENCOUNTER — Encounter: Payer: Self-pay | Admitting: Ophthalmology

## 2021-07-20 ENCOUNTER — Encounter
Admission: RE | Disposition: A | Discharge status: Home / Self Care | Payer: Self-pay | Source: Home / Self Care | Attending: Ophthalmology

## 2021-07-20 ENCOUNTER — Ambulatory Visit
Admission: RE | Admit: 2021-07-20 | Discharge: 2021-07-20 | Disposition: A | Discharge status: Home / Self Care | Payer: BC Managed Care – PPO | Attending: Ophthalmology | Admitting: Ophthalmology

## 2021-07-20 ENCOUNTER — Other Ambulatory Visit: Payer: Self-pay

## 2021-07-20 DIAGNOSIS — H2512 Age-related nuclear cataract, left eye: Secondary | ICD-10-CM | POA: Insufficient documentation

## 2021-07-20 DIAGNOSIS — Z9884 Bariatric surgery status: Secondary | ICD-10-CM | POA: Insufficient documentation

## 2021-07-20 DIAGNOSIS — Z79899 Other long term (current) drug therapy: Secondary | ICD-10-CM | POA: Diagnosis not present

## 2021-07-20 DIAGNOSIS — H25812 Combined forms of age-related cataract, left eye: Secondary | ICD-10-CM | POA: Diagnosis not present

## 2021-07-20 HISTORY — PX: CATARACT EXTRACTION W/PHACO: SHX586

## 2021-07-20 SURGERY — PHACOEMULSIFICATION, CATARACT, WITH IOL INSERTION
Anesthesia: Monitor Anesthesia Care | Site: Eye | Laterality: Left

## 2021-07-20 MED ORDER — PHENYLEPHRINE HCL 10 % OP SOLN
1.0000 [drp] | OPHTHALMIC | Status: DC | PRN
  Administered 2021-07-20 (×3): 1 [drp] via OPHTHALMIC

## 2021-07-20 MED ORDER — SIGHTPATH DOSE#1 BSS IO SOLN
INTRAOCULAR | Status: DC | PRN
  Administered 2021-07-20: 15 mL via INTRAOCULAR

## 2021-07-20 MED ORDER — TETRACAINE HCL 0.5 % OP SOLN
1.0000 [drp] | OPHTHALMIC | Status: DC | PRN
  Administered 2021-07-20 (×3): 1 [drp] via OPHTHALMIC

## 2021-07-20 MED ORDER — MIDAZOLAM HCL 2 MG/2ML IJ SOLN
INTRAMUSCULAR | Status: DC | PRN
  Administered 2021-07-20: 1 mg via INTRAVENOUS

## 2021-07-20 MED ORDER — SIGHTPATH DOSE#1 BSS IO SOLN
INTRAOCULAR | Status: DC | PRN
  Administered 2021-07-20: 2 mL

## 2021-07-20 MED ORDER — FENTANYL CITRATE (PF) 100 MCG/2ML IJ SOLN
INTRAMUSCULAR | Status: DC | PRN
  Administered 2021-07-20: 50 ug via INTRAVENOUS

## 2021-07-20 MED ORDER — MOXIFLOXACIN HCL 0.5 % OP SOLN
OPHTHALMIC | Status: DC | PRN
  Administered 2021-07-20: 0.2 mL via OPHTHALMIC

## 2021-07-20 MED ORDER — CYCLOPENTOLATE HCL 2 % OP SOLN
1.0000 [drp] | OPHTHALMIC | Status: DC | PRN
  Administered 2021-07-20 (×3): 1 [drp] via OPHTHALMIC

## 2021-07-20 MED ORDER — SIGHTPATH DOSE#1 BSS IO SOLN
INTRAOCULAR | Status: DC | PRN
  Administered 2021-07-20: 62 mL via OPHTHALMIC

## 2021-07-20 MED ORDER — BRIMONIDINE TARTRATE-TIMOLOL 0.2-0.5 % OP SOLN
OPHTHALMIC | Status: DC | PRN
  Administered 2021-07-20: 1 [drp] via OPHTHALMIC

## 2021-07-20 MED ORDER — SIGHTPATH DOSE#1 NA CHONDROIT SULF-NA HYALURON 40-17 MG/ML IO SOLN
INTRAOCULAR | Status: DC | PRN
  Administered 2021-07-20: 1 mL via INTRAOCULAR

## 2021-07-20 SURGICAL SUPPLY — 14 items
CANNULA ANT/CHMB 27GA (MISCELLANEOUS) ×4 IMPLANT
GLOVE SURG ENC TEXT LTX SZ8 (GLOVE) ×2 IMPLANT
GLOVE SURG TRIUMPH 8.0 PF LTX (GLOVE) ×2 IMPLANT
GOWN STRL REUS W/ TWL LRG LVL3 (GOWN DISPOSABLE) ×2 IMPLANT
GOWN STRL REUS W/TWL LRG LVL3 (GOWN DISPOSABLE) ×4
LENS IOL TECNIS EYHANCE 20.5 (Intraocular Lens) ×2 IMPLANT
MARKER SKIN DUAL TIP RULER LAB (MISCELLANEOUS) ×2 IMPLANT
NEEDLE FILTER BLUNT 18X 1/2SAF (NEEDLE) ×1
NEEDLE FILTER BLUNT 18X1 1/2 (NEEDLE) ×1 IMPLANT
PACK EYE AFTER SURG (MISCELLANEOUS) ×2 IMPLANT
SYR 3ML LL SCALE MARK (SYRINGE) ×2 IMPLANT
SYR TB 1ML LUER SLIP (SYRINGE) ×2 IMPLANT
WATER STERILE IRR 250ML POUR (IV SOLUTION) ×2 IMPLANT
WIPE NON LINTING 3.25X3.25 (MISCELLANEOUS) ×2 IMPLANT

## 2021-07-20 NOTE — H&P (Signed)
[facility]   Primary Care Physician:  [name], MD Ophthalmologist: Dr. Valentina Gu  Pre-Procedure History & Physical: HPI:  Harold Smith is a 55 y.o. male here for cataract surgery.   Past Medical History:  Diagnosis Date   Allergic rhinitis due to pollen    Colon polyps    Diverticulitis large intestine    Obesity    Osteoarthritis, knee    Sleep apnea    does not use cpap   Sleep disturbance     Past Surgical History:  Procedure Laterality Date   ACHILLES TENDON REPAIR Right 2008   COLON RESECTION Left 2006   due to diverticular disease at [place]   COLONOSCOPY  2013?   COLONOSCOPY WITH PROPOFOL N/A 05/22/2018   Procedure: COLONOSCOPY WITH PROPOFOL;  Surgeon: Lin Landsman, MD;  Location[facility];  Service: Gastroenterology;  Laterality: N/A;   COLONOSCOPY WITH PROPOFOL N/A 10/22/2020   Procedure: COLONOSCOPY WITH PROPOFOL;  Surgeon: Lin Landsman, MD;  Location: [place];  Service: Endoscopy;  Laterality: N/A;  priority 4   DRUG INDUCED ENDOSCOPY N/A 06/10/2019   Procedure: DRUG INDUCED SLEEP ENDOSCOPY;  Surgeon: Jerrell Belfast, MD;  Location: [place];  Service: ENT;  Laterality: N/A;   GASTRIC BYPASS N/A 2009   HIATAL HERNIA REPAIR  10/15   at [place]   POLYPECTOMY  10/22/2020   Procedure: POLYPECTOMY;  Surgeon: Lin Landsman, MD;  Location: [place];  Service: Endoscopy;;   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 Authorizing Provider  Cyanocobalamin (VITAMIN B-12 PO) Take by mouth.   Yes [provider]  Ferrous Sulfate (IRON PO) Take by mouth.   Yes [provider]  Multiple Vitamin (MULTIVITAMIN) tablet Take 1 tablet by mouth daily.   Yes [provider]  zolpidem (AMBIEN) 10 MG tablet TAKE ONE TABLET (10 MG) BY MOUTH AT BEDTIME AS NEEDED FOR SLEEP 07/02/21  Yes Viviana Simpler I, MD  hydrocortisone 2.5 % cream Apply topically 3 (three) times daily as needed. 07/02/21   [name], MD    Allergies as of 06/14/2021   (No Known Allergies)    Family History  Problem Relation Age of Onset   Hypertension Mother    Diabetes Mother    Kidney disease Mother    Heart disease Mother    Colon cancer Father 37   Asthma Sister    Obesity Sister    Stroke Sister    Obesity Brother    Diabetes Other    Heart disease Other    Hypertension Other    Kidney disease Other    Cancer Paternal Uncle        unk type, possible prostate   Lung cancer Paternal Grandmother     Social History   Socioeconomic History   Marital status: Single    Spouse name: Not on file   Number of children: 3   Years of education: Not on file   Highest education level: Not on file  Occupational History   Occupation: Production designer, theatre/television/film  Tobacco Use   Smoking status: Never   Smokeless tobacco: Never   Tobacco comments:    occasional cigar  Vaping Use   Vaping Use: Never used  Substance and Sexual Activity   Alcohol use: Yes    Alcohol/week: 0.0 standard drinks    Comment: occasional  Drug use: No   Sexual activity: Yes  Other Topics Concern   Not on file  Social History Narrative   Lives with 2 daughters from 53st marriage   Shared custody of another daughter from 69nd marriage   Social Determinants of Health   Financial Resource Strain: Not on file  Food Insecurity: Not on file  Transportation Needs: Not on file  Physical Activity: Not on file  Stress: Not on file  Social Connections: Not on file  Intimate Partner Violence: Not on file    Review of Systems: See HPI, otherwise negative ROS  Physical Exam: BP (!) 147/92   Pulse 83   Temp (!) 97.1 F (36.2 C) (Temporal)   Resp 18   Ht '5\' 10"'$  (1.778 m)   Wt 86.6 kg   SpO2 99%   BMI 27.41 kg/m  General:   Alert, cooperative in NAD Head:  Normocephalic and atraumatic. Respiratory:  Normal work  of breathing. Cardiovascular:  RRR  Impression/Plan: Harold Smith is here for cataract surgery.  Risks, benefits, limitations, and alternatives regarding cataract surgery have been reviewed with the patient.  Questions have been answered.  All parties agreeable.   Birder Robson, MD  07/20/2021, 10:16 AM

## 2021-07-20 NOTE — Anesthesia Postprocedure Evaluation (Signed)
Anesthesia Post Note  Patient: Harold Smith  Procedure(s) Performed: CATARACT EXTRACTION PHACO AND INTRAOCULAR LENS PLACEMENT (IOC) LEFT 2.11 00:24.0 (Left: Eye)     Patient location during evaluation: PACU Anesthesia Type: MAC Level of consciousness: awake Pain management: pain level controlled Vital Signs Assessment: post-procedure vital signs reviewed and stable Respiratory status: respiratory function stable Cardiovascular status: stable Postop Assessment: no apparent nausea or vomiting Anesthetic complications: no   No notable events documented.  Veda Canning

## 2021-07-20 NOTE — Transfer of Care (Signed)
Immediate Anesthesia Transfer of Care Note  Patient: Harold Smith  Procedure(s) Performed: CATARACT EXTRACTION PHACO AND INTRAOCULAR LENS PLACEMENT (IOC) LEFT 2.11 00:24.0 (Left: Eye)  Patient Location: PACU  Anesthesia Type: MAC  Level of Consciousness: awake, alert  and patient cooperative  Airway and Oxygen Therapy: Patient Spontanous Breathing and Patient connected to supplemental oxygen  Post-op Assessment: Post-op Vital signs reviewed, Patient's Cardiovascular Status Stable, Respiratory Function Stable, Patent Airway and No signs of Nausea or vomiting  Post-op Vital Signs: Reviewed and stable  Complications: No notable events documented.

## 2021-07-20 NOTE — Op Note (Signed)
PREOPERATIVE DIAGNOSIS:  Nuclear sclerotic cataract of the left eye.   POSTOPERATIVE DIAGNOSIS:  Nuclear sclerotic cataract of the left eye.   OPERATIVE PROCEDURE:ORPROCALL@   SURGEON:  Birder Robson, MD.   ANESTHESIA:  Anesthesiologist: Veda Canning, MD CRNA: Dionne Bucy, CRNA  1.      Managed anesthesia care. 2.     0.23m of Shugarcaine was instilled following the paracentesis   COMPLICATIONS:  None.   TECHNIQUE:   Stop and chop   DESCRIPTION OF PROCEDURE:  The patient was examined and consented in the preoperative holding area where the aforementioned topical anesthesia was applied to the left eye and then brought back to the Operating Room where the left eye was prepped and draped in the usual sterile ophthalmic fashion and a lid speculum was placed. A paracentesis was created with the side port blade and the anterior chamber was filled with viscoelastic. A near clear corneal incision was performed with the steel keratome. A continuous curvilinear capsulorrhexis was performed with a cystotome followed by the capsulorrhexis forceps. Hydrodissection and hydrodelineation were carried out with BSS on a blunt cannula. The lens was removed in a stop and chop  technique and the remaining cortical material was removed with the irrigation-aspiration handpiece. The capsular bag was inflated with viscoelastic and the Technis [id number] lens was placed in the capsular bag without complication. The remaining viscoelastic was removed from the eye with the irrigation-aspiration handpiece. The wounds were hydrated. The anterior chamber was flushed with BSS and the eye was inflated to physiologic pressure. 0.151mVigamox was placed in the anterior chamber. The wounds were found to be water tight. The eye was dressed with Combigan. The patient was given protective glasses to wear throughout the day and a shield with which to sleep tonight. The patient was also given drops with which to begin a drop regimen  today and will follow-up with me in one day. Implant Name Type Inv. Item Serial No. Manufacturer Lot No. LRB No. Used Action  LENS IOL TECNIS EYHANCE 20.5 - [id number] Lens LENS IOL TECNIS EYHANCE 20.5 [id number]   Left 1 Implanted    Procedure(s) with comments: CATARACT EXTRACTION PHACO AND INTRAOCULAR LENS PLACEMENT (IOC) LEFT 2.11 00:24.0 (Left) - sleep apnea  Electronically signed: WiBirder Robson/26/2022 10:40 AM

## 2021-07-20 NOTE — Anesthesia Procedure Notes (Signed)
Procedure Name: MAC Date/Time: 07/20/2021 10:23 AM Performed by: Dionne Bucy, CRNA Pre-anesthesia Checklist: Patient identified, Emergency Drugs available, Suction available, Patient being monitored and Timeout performed Patient Re-evaluated:Patient Re-evaluated prior to induction Oxygen Delivery Method: Nasal cannula Placement Confirmation: positive ETCO2

## 2021-07-20 NOTE — Anesthesia Preprocedure Evaluation (Signed)
Anesthesia Evaluation  Patient identified by MRN, date of birth, ID band Patient awake    Reviewed: Allergy & Precautions, NPO status   Airway Mallampati: II  TM Distance: >3 FB     Dental   Pulmonary sleep apnea (no cpap) , Patient abstained from smoking.,    Pulmonary exam normal        Cardiovascular negative cardio ROS   Rhythm:Regular Rate:Normal     Neuro/Psych    GI/Hepatic   Endo/Other    Renal/GU      Musculoskeletal  (+) Arthritis ,   Abdominal   Peds  Hematology   Anesthesia Other Findings   Reproductive/Obstetrics                             Anesthesia Physical Anesthesia Plan  ASA: 2  Anesthesia Plan: MAC   Post-op Pain Management:    Induction: Intravenous  PONV Risk Score and Plan: TIVA, Midazolam and Treatment may vary due to age or medical condition  Airway Management Planned: Natural Airway and Nasal Cannula  Additional Equipment:   Intra-op Plan:   Post-operative Plan:   Informed Consent: I have reviewed the patients History and Physical, chart, labs and discussed the procedure including the risks, benefits and alternatives for the proposed anesthesia with the patient or authorized representative who has indicated his/her understanding and acceptance.       Plan Discussed with: CRNA  Anesthesia Plan Comments:         Anesthesia Quick Evaluation

## 2021-07-21 ENCOUNTER — Encounter: Payer: Self-pay | Admitting: Ophthalmology

## 2021-07-21 ENCOUNTER — Other Ambulatory Visit: Payer: Self-pay

## 2021-07-22 NOTE — Anesthesia Preprocedure Evaluation (Addendum)
Anesthesia Evaluation  Patient identified by MRN, date of birth, ID band Patient awake    Reviewed: Allergy & Precautions, NPO status , Patient's Chart, lab work & pertinent test results  History of Anesthesia Complications Negative for: history of anesthetic complications  Airway Mallampati: I   Neck ROM: Full    Dental   Missing molars x10:   Pulmonary sleep apnea , Current Smoker (occasional cigar) and Patient abstained from smoking.,    Pulmonary exam normal breath sounds clear to auscultation       Cardiovascular Exercise Tolerance: Good negative cardio ROS Normal cardiovascular exam Rhythm:Regular Rate:Normal     Neuro/Psych negative neurological ROS     GI/Hepatic S/p gastric bypass   Endo/Other  negative endocrine ROS  Renal/GU negative Renal ROS     Musculoskeletal  (+) Arthritis ,   Abdominal   Peds  Hematology negative hematology ROS (+)   Anesthesia Other Findings   Reproductive/Obstetrics                            Anesthesia Physical Anesthesia Plan  ASA: 2  Anesthesia Plan: MAC   Post-op Pain Management:    Induction: Intravenous  PONV Risk Score and Plan: 1 and TIVA, Midazolam and Treatment may vary due to age or medical condition  Airway Management Planned: Nasal Cannula  Additional Equipment:   Intra-op Plan:   Post-operative Plan:   Informed Consent: I have reviewed the patients History and Physical, chart, labs and discussed the procedure including the risks, benefits and alternatives for the proposed anesthesia with the patient or authorized representative who has indicated his/her understanding and acceptance.       Plan Discussed with: CRNA  Anesthesia Plan Comments:        Anesthesia Quick Evaluation

## 2021-07-26 DIAGNOSIS — H2511 Age-related nuclear cataract, right eye: Secondary | ICD-10-CM | POA: Diagnosis not present

## 2021-07-27 ENCOUNTER — Encounter: Payer: Self-pay | Admitting: Internal Medicine

## 2021-07-30 NOTE — Discharge Instructions (Signed)

## 2021-08-03 ENCOUNTER — Encounter: Payer: Self-pay | Admitting: Ophthalmology

## 2021-08-03 ENCOUNTER — Ambulatory Visit: Payer: BC Managed Care – PPO | Admitting: Anesthesiology

## 2021-08-03 ENCOUNTER — Encounter
Admission: RE | Disposition: A | Discharge status: Home / Self Care | Payer: Self-pay | Source: Home / Self Care | Attending: Ophthalmology

## 2021-08-03 ENCOUNTER — Ambulatory Visit
Admission: RE | Admit: 2021-08-03 | Discharge: 2021-08-03 | Disposition: A | Discharge status: Home / Self Care | Payer: BC Managed Care – PPO | Attending: Ophthalmology | Admitting: Ophthalmology

## 2021-08-03 ENCOUNTER — Other Ambulatory Visit: Payer: Self-pay

## 2021-08-03 DIAGNOSIS — Z9884 Bariatric surgery status: Secondary | ICD-10-CM | POA: Insufficient documentation

## 2021-08-03 DIAGNOSIS — H2511 Age-related nuclear cataract, right eye: Secondary | ICD-10-CM | POA: Diagnosis not present

## 2021-08-03 DIAGNOSIS — H25811 Combined forms of age-related cataract, right eye: Secondary | ICD-10-CM | POA: Diagnosis not present

## 2021-08-03 DIAGNOSIS — F17290 Nicotine dependence, other tobacco product, uncomplicated: Secondary | ICD-10-CM | POA: Insufficient documentation

## 2021-08-03 HISTORY — PX: CATARACT EXTRACTION W/PHACO: SHX586

## 2021-08-03 SURGERY — PHACOEMULSIFICATION, CATARACT, WITH IOL INSERTION
Anesthesia: Monitor Anesthesia Care | Site: Eye | Laterality: Right

## 2021-08-03 MED ORDER — LACTATED RINGERS IV SOLN: INTRAVENOUS | Status: DC

## 2021-08-03 MED ORDER — TETRACAINE HCL 0.5 % OP SOLN
1.0000 [drp] | OPHTHALMIC | Status: DC | PRN
  Administered 2021-08-03 (×3): 1 [drp] via OPHTHALMIC

## 2021-08-03 MED ORDER — FENTANYL CITRATE (PF) 100 MCG/2ML IJ SOLN
INTRAMUSCULAR | Status: DC | PRN
  Administered 2021-08-03: 50 ug via INTRAVENOUS

## 2021-08-03 MED ORDER — SIGHTPATH DOSE#1 NA CHONDROIT SULF-NA HYALURON 40-17 MG/ML IO SOLN
INTRAOCULAR | Status: DC | PRN
  Administered 2021-08-03: 1 mL via INTRAOCULAR

## 2021-08-03 MED ORDER — MIDAZOLAM HCL 2 MG/2ML IJ SOLN
INTRAMUSCULAR | Status: DC | PRN
Start: 2021-08-03 — End: 2021-08-03
  Administered 2021-08-03: 1 mg via INTRAVENOUS

## 2021-08-03 MED ORDER — CYCLOPENTOLATE HCL 2 % OP SOLN
1.0000 [drp] | OPHTHALMIC | Status: AC
  Administered 2021-08-03 (×3): 1 [drp] via OPHTHALMIC

## 2021-08-03 MED ORDER — SIGHTPATH DOSE#1 BSS IO SOLN
INTRAOCULAR | Status: DC | PRN
  Administered 2021-08-03: 57 mL via OPHTHALMIC

## 2021-08-03 MED ORDER — MOXIFLOXACIN HCL 0.5 % OP SOLN
OPHTHALMIC | Status: DC | PRN
  Administered 2021-08-03: 0.2 mL via OPHTHALMIC

## 2021-08-03 MED ORDER — SIGHTPATH DOSE#1 BSS IO SOLN
INTRAOCULAR | Status: DC | PRN
  Administered 2021-08-03: 1 mL

## 2021-08-03 MED ORDER — SIGHTPATH DOSE#1 BSS IO SOLN
INTRAOCULAR | Status: DC | PRN
  Administered 2021-08-03: 15 mL

## 2021-08-03 MED ORDER — BRIMONIDINE TARTRATE-TIMOLOL 0.2-0.5 % OP SOLN
OPHTHALMIC | Status: DC | PRN
  Administered 2021-08-03: 1 [drp] via OPHTHALMIC

## 2021-08-03 MED ORDER — PHENYLEPHRINE HCL 10 % OP SOLN
1.0000 [drp] | OPHTHALMIC | Status: AC
  Administered 2021-08-03 (×3): 1 [drp] via OPHTHALMIC

## 2021-08-03 SURGICAL SUPPLY — 16 items
CANNULA ANT/CHMB 27GA (MISCELLANEOUS) ×4 IMPLANT
GLOVE SURG ENC TEXT LTX SZ8 (GLOVE) ×2 IMPLANT
GLOVE SURG TRIUMPH 8.0 PF LTX (GLOVE) ×2 IMPLANT
GOWN STRL REUS W/ TWL LRG LVL3 (GOWN DISPOSABLE) ×2 IMPLANT
GOWN STRL REUS W/TWL LRG LVL3 (GOWN DISPOSABLE) ×4
LENS IOL TECNIS EYHANCE 20.0 (Intraocular Lens) ×2 IMPLANT
MARKER SKIN DUAL TIP RULER LAB (MISCELLANEOUS) ×2 IMPLANT
NEEDLE FILTER BLUNT 18X 1/2SAF (NEEDLE) ×1
NEEDLE FILTER BLUNT 18X1 1/2 (NEEDLE) ×1 IMPLANT
PACK EYE AFTER SURG (MISCELLANEOUS) ×2 IMPLANT
SUT ETHILON 10-0 CS-B-6CS-B-6 (SUTURE)
SUTURE EHLN 10-0 CS-B-6CS-B-6 (SUTURE) IMPLANT
SYR 3ML LL SCALE MARK (SYRINGE) ×2 IMPLANT
SYR TB 1ML LUER SLIP (SYRINGE) ×2 IMPLANT
WATER STERILE IRR 250ML POUR (IV SOLUTION) ×2 IMPLANT
WIPE NON LINTING 3.25X3.25 (MISCELLANEOUS) ×2 IMPLANT

## 2021-08-03 NOTE — Op Note (Signed)
PREOPERATIVE DIAGNOSIS:  Nuclear sclerotic cataract of the right eye.   POSTOPERATIVE DIAGNOSIS:  Cataract   OPERATIVE PROCEDURE:ORPROCALL@   SURGEON:  Harold Robson, MD.   ANESTHESIA:  Anesthesiologist: Darrin Nipper, MD CRNA: Cameron Ali, CRNA  1.      Managed anesthesia care. 2.      0.71m of Shugarcaine was instilled in the eye following the paracentesis.   COMPLICATIONS:  None.   TECHNIQUE:   Stop and chop   DESCRIPTION OF PROCEDURE:  The patient was examined and consented in the preoperative holding area where the aforementioned topical anesthesia was applied to the right eye and then brought back to the Operating Room where the right eye was prepped and draped in the usual sterile ophthalmic fashion and a lid speculum was placed. A paracentesis was created with the side port blade and the anterior chamber was filled with viscoelastic. A near clear corneal incision was performed with the steel keratome. A continuous curvilinear capsulorrhexis was performed with a cystotome followed by the capsulorrhexis forceps. Hydrodissection and hydrodelineation were carried out with BSS on a blunt cannula. The lens was removed in a stop and chop  technique and the remaining cortical material was removed with the irrigation-aspiration handpiece. The capsular bag was inflated with viscoelastic and the Technis [id number]  lens was placed in the capsular bag without complication. The remaining viscoelastic was removed from the eye with the irrigation-aspiration handpiece. The wounds were hydrated. The anterior chamber was flushed with BSS and the eye was inflated to physiologic pressure. 0.133mof Vigamox was placed in the anterior chamber. The wounds were found to be water tight. The eye was dressed with Combigan. The patient was given protective glasses to wear throughout the day and a shield with which to sleep tonight. The patient was also given drops with which to begin a drop regimen today and will  follow-up with me in one day. Implant Name Type Inv. Item Serial No. Manufacturer Lot No. LRB No. Used Action  LENS IOL TECNIS EYHANCE 20.0 - [id number] Lens LENS IOL TECNIS EYHANCE 20.0 [id number]   Right 1 Implanted   Procedure(s) with comments: CATARACT EXTRACTION PHACO AND INTRAOCULAR LENS PLACEMENT ([facility]) RIGHT (Right) - 3.19 0:33.0  Electronically signed: WiBirder Smith/08/2021 11:23 AM

## 2021-08-03 NOTE — Transfer of Care (Signed)
Immediate Anesthesia Transfer of Care Note  Patient: Harold Smith  Procedure(s) Performed: CATARACT EXTRACTION PHACO AND INTRAOCULAR LENS PLACEMENT ([facility]) RIGHT (Right: Eye)  Patient Location: PACU  Anesthesia Type: MAC  Level of Consciousness: awake, alert  and patient cooperative  Airway and Oxygen Therapy: Patient Spontanous Breathing and Patient connected to supplemental oxygen  Post-op Assessment: Post-op Vital signs reviewed, Patient's Cardiovascular Status Stable, Respiratory Function Stable, Patent Airway and No signs of Nausea or vomiting  Post-op Vital Signs: Reviewed and stable  Complications: No notable events documented.

## 2021-08-03 NOTE — Anesthesia Postprocedure Evaluation (Signed)
Anesthesia Post Note  Patient: Harold Smith  Procedure(s) Performed: CATARACT EXTRACTION PHACO AND INTRAOCULAR LENS PLACEMENT ([facility]) RIGHT (Right: Eye)     Patient location during evaluation: PACU Anesthesia Type: MAC Level of consciousness: awake and alert, oriented and patient cooperative Pain management: pain level controlled Vital Signs Assessment: post-procedure vital signs reviewed and stable Respiratory status: spontaneous breathing, nonlabored ventilation and respiratory function stable Cardiovascular status: blood pressure returned to baseline and stable Postop Assessment: adequate PO intake Anesthetic complications: no   No notable events documented.  Darrin Nipper

## 2021-08-03 NOTE — Anesthesia Procedure Notes (Signed)
Procedure Name: MAC Date/Time: 08/03/2021 11:05 AM Performed by: Cameron Ali, CRNA Pre-anesthesia Checklist: Patient identified, Emergency Drugs available, Suction available, Timeout performed and Patient being monitored Patient Re-evaluated:Patient Re-evaluated prior to induction Oxygen Delivery Method: Nasal cannula Placement Confirmation: positive ETCO2

## 2021-08-03 NOTE — H&P (Signed)
[facility]   Primary Care Physician:  [name], MD Ophthalmologist: Dr. George Ina  Pre-Procedure History & Physical: HPI:  Harold Smith is a 55 y.o. male here for cataract surgery.   Past Medical History:  Diagnosis Date   Allergic rhinitis due to pollen    Colon polyps    Diverticulitis large intestine    Obesity    Osteoarthritis, knee    Sleep apnea    does not use cpap   Sleep disturbance     Past Surgical History:  Procedure Laterality Date   ACHILLES TENDON REPAIR Right 2008   CATARACT EXTRACTION W/PHACO Left 07/20/2021   Procedure: CATARACT EXTRACTION PHACO AND INTRAOCULAR LENS PLACEMENT ([place]) LEFT 2.11 00:24.0;  Surgeon: Birder Robson, MD;  Location: [place];  Service: Ophthalmology;  Laterality: Left;  sleep apnea   COLON RESECTION Left 2006   due to diverticular disease at [place]   COLONOSCOPY  2013?   COLONOSCOPY WITH PROPOFOL N/A 05/22/2018   Procedure: COLONOSCOPY WITH PROPOFOL;  Surgeon: Lin Landsman, MD;  Location: [facility] END[facility]COPY;  Service: Gastroenterology;  Laterality: N/A;   COLONOSCOPY WITH PROPOFOL N/A 10/22/2020   Procedure: COLONOSCOPY WITH PROPOFOL;  Surgeon: Lin Landsman, MD;  Location: [place];  Service: Endoscopy;  Laterality: N/A;  priority 4   DRUG INDUCED ENDOSCOPY N/A 06/10/2019   Procedure: DRUG INDUCED SLEEP ENDOSCOPY;  Surgeon: Jerrell Belfast, MD;  Location: [place];  Service: ENT;  Laterality: N/A;   GASTRIC BYPASS N/A 2009   HIATAL HERNIA REPAIR  10/15   at [place]   POLYPECTOMY  10/22/2020   Procedure: POLYPECTOMY;  Surgeon: Lin Landsman, MD;  Location: [place];  Service: Endoscopy;;   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   revision    Prior to Admission medications   Medication Sig Start Date End Date Taking? Authorizing Provider  Cyanocobalamin (VITAMIN B-12 PO) Take by mouth.   Yes [provider]  Ferrous Sulfate (IRON PO) Take  by mouth.   Yes [provider]  Multiple Vitamin (MULTIVITAMIN) tablet Take 1 tablet by mouth daily.   Yes [provider]  zolpidem (AMBIEN) 10 MG tablet TAKE ONE TABLET (10 MG) BY MOUTH AT BEDTIME AS NEEDED FOR SLEEP 07/02/21  Yes Viviana Simpler I, MD  hydrocortisone 2.5 % cream Apply topically 3 (three) times daily as needed. 07/02/21   [name], MD    Allergies as of 06/14/2021   (No Known Allergies)    Family History  Problem Relation Age of Onset   Hypertension Mother    Diabetes Mother    Kidney disease Mother    Heart disease Mother    Colon cancer Father 30   Asthma Sister    Obesity Sister    Stroke Sister    Obesity Brother    Diabetes Other    Heart disease Other    Hypertension Other    Kidney disease Other    Cancer Paternal Uncle        Harold type, possible prostate   Lung cancer Paternal Grandmother     Social History   Socioeconomic History   Marital status: Single    Spouse name: Not on file   Number of children: 3   Years of education: Not on file   Highest education level: Not on file  Occupational History   Occupation: Production designer, theatre/television/film  Tobacco Use   Smoking status: Never   Smokeless tobacco: Never  Tobacco comments:    occasional cigar  Vaping Use   Vaping Use: Never used  Substance and Sexual Activity   Alcohol use: Yes    Alcohol/week: 0.0 standard drinks    Comment: occasional   Drug use: No   Sexual activity: Yes  Other Topics Concern   Not on file  Social History Narrative   Lives with 2 daughters from 16st marriage   Shared custody of another daughter from 2nd marriage   Social Determinants of Health   Financial Resource Strain: Not on file  Food Insecurity: Not on file  Transportation Needs: Not on file  Physical Activity: Not on file  Stress: Not on file  Social Connections: Not on file  Intimate Partner Violence: Not on file    Review of Systems: See HPI, otherwise negative  ROS  Physical Exam: BP (!) 145/93   Pulse 71   Temp (!) 97.3 F (36.3 C) (Temporal)   Ht '5\' 10"'$  (1.778 m)   Wt 89.4 kg   SpO2 98%   BMI 28.27 kg/m  General:   Alert, cooperative in NAD Head:  Normocephalic and atraumatic. Respiratory:  Normal work of breathing. Cardiovascular:  RRR  Impression/Plan: Harold Smith is here for cataract surgery.  Risks, benefits, limitations, and alternatives regarding cataract surgery have been reviewed with the patient.  Questions have been answered.  All parties agreeable.   Birder Robson, MD  08/03/2021, 10:59 AM

## 2021-08-04 ENCOUNTER — Encounter: Payer: Self-pay | Admitting: Ophthalmology

## 2021-10-07 ENCOUNTER — Other Ambulatory Visit: Payer: Self-pay

## 2021-10-07 ENCOUNTER — Ambulatory Visit (INDEPENDENT_AMBULATORY_CARE_PROVIDER_SITE_OTHER): Payer: BC Managed Care – PPO

## 2021-10-07 DIAGNOSIS — Z23 Encounter for immunization: Secondary | ICD-10-CM

## 2021-10-12 ENCOUNTER — Telehealth: Payer: Self-pay

## 2021-10-12 NOTE — Telephone Encounter (Signed)
Dr Verlin Grills RECALL-Patient is ready to get Colonoscopy scheduled.

## 2021-10-13 ENCOUNTER — Telehealth: Payer: Self-pay | Admitting: Gastroenterology

## 2021-10-13 NOTE — Telephone Encounter (Signed)
Pt. Calling to schedule colonoscopy 

## 2021-10-13 NOTE — Telephone Encounter (Signed)
Returned patients call. LVM to call back °

## 2021-10-14 ENCOUNTER — Ambulatory Visit: Payer: BC Managed Care – PPO

## 2021-10-19 ENCOUNTER — Other Ambulatory Visit: Payer: Self-pay

## 2021-10-19 ENCOUNTER — Telehealth: Payer: Self-pay

## 2021-10-19 DIAGNOSIS — Z800 Family history of malignant neoplasm of digestive organs: Secondary | ICD-10-CM

## 2021-10-19 DIAGNOSIS — Z86010 Personal history of colonic polyps: Secondary | ICD-10-CM

## 2021-10-19 MED ORDER — PEG 3350-KCL-NA BICARB-NACL 420 G PO SOLR: 4000.0000 mL | Freq: Once | ORAL | 0 refills | Status: AC

## 2021-10-19 NOTE — Progress Notes (Signed)
Gastroenterology Pre-Procedure Review  Request Date: 11/10/21 Requesting Physician: Dr. Marius Ditch  PATIENT REVIEW QUESTIONS: The patient responded to the following health history questions as indicated:    1. Are you having any GI issues? no 2. Do you have a personal history of Polyps? yes (10/22/2020 polyps removed.) 3. Do you have a family history of Colon Cancer or Polyps? yes (Father colorectal cancer) 4. Diabetes Mellitus? no 5. Joint replacements in the past 12 months?no 6. Major health problems in the past 3 months?no 7. Any artificial heart valves, MVP, or defibrillator?no    MEDICATIONS & ALLERGIES:    Patient reports the following regarding taking any anticoagulation/antiplatelet therapy:   Plavix, Coumadin, Eliquis, Xarelto, Lovenox, Pradaxa, Brilinta, or Effient? no Aspirin? no  Patient confirms/reports the following medications:  Current Outpatient Medications  Medication Sig Dispense Refill   Cyanocobalamin (VITAMIN B-12 PO) Take by mouth.     Ferrous Sulfate (IRON PO) Take by mouth.     hydrocortisone 2.5 % cream Apply topically 3 (three) times daily as needed. 28 g 3   Multiple Vitamin (MULTIVITAMIN) tablet Take 1 tablet by mouth daily.     zolpidem (AMBIEN) 10 MG tablet TAKE ONE TABLET (10 MG) BY MOUTH AT BEDTIME AS NEEDED FOR SLEEP 30 tablet 0   No current facility-administered medications for this visit.    Patient confirms/reports the following allergies:  No Known Allergies  No orders of the defined types were placed in this encounter.   AUTHORIZATION INFORMATION Primary Insurance: 1D#: Group #:  Secondary Insurance: 1D#: Group #:  SCHEDULE INFORMATION: Date: 11/10/21 Time: Location: [place]

## 2021-10-19 NOTE — Telephone Encounter (Signed)
Pt. Returning call to schedule colonoscopy

## 2021-10-19 NOTE — Telephone Encounter (Signed)
Procedure scheduled for 11/10/21.

## 2021-10-25 ENCOUNTER — Inpatient Hospital Stay
Admission: EM | Admit: 2021-10-25 | Discharge: 2021-10-29 | DRG: 478 | Disposition: A | Discharge status: Home / Self Care | Payer: Worker's Comp, Other unspecified | Attending: Internal Medicine | Admitting: Internal Medicine

## 2021-10-25 ENCOUNTER — Emergency Department: Payer: Worker's Comp, Other unspecified

## 2021-10-25 DIAGNOSIS — Y92410 Unspecified street and highway as the place of occurrence of the external cause: Secondary | ICD-10-CM

## 2021-10-25 DIAGNOSIS — S32010A Wedge compression fracture of first lumbar vertebra, initial encounter for closed fracture: Secondary | ICD-10-CM

## 2021-10-25 DIAGNOSIS — R059 Cough, unspecified: Secondary | ICD-10-CM

## 2021-10-25 DIAGNOSIS — S32011A Stable burst fracture of first lumbar vertebra, initial encounter for closed fracture: Principal | ICD-10-CM | POA: Diagnosis present

## 2021-10-25 DIAGNOSIS — Z20822 Contact with and (suspected) exposure to covid-19: Secondary | ICD-10-CM | POA: Diagnosis present

## 2021-10-25 DIAGNOSIS — R000 Tachycardia, unspecified: Secondary | ICD-10-CM

## 2021-10-25 DIAGNOSIS — E871 Hypo-osmolality and hyponatremia: Secondary | ICD-10-CM | POA: Diagnosis present

## 2021-10-25 DIAGNOSIS — E861 Hypovolemia: Secondary | ICD-10-CM | POA: Diagnosis present

## 2021-10-25 DIAGNOSIS — R509 Fever, unspecified: Secondary | ICD-10-CM

## 2021-10-25 DIAGNOSIS — R5383 Other fatigue: Secondary | ICD-10-CM

## 2021-10-25 DIAGNOSIS — J101 Influenza due to other identified influenza virus with other respiratory manifestations: Secondary | ICD-10-CM | POA: Diagnosis present

## 2021-10-25 DIAGNOSIS — Z8719 Personal history of other diseases of the digestive system: Secondary | ICD-10-CM

## 2021-10-25 DIAGNOSIS — Z8781 Personal history of (healed) traumatic fracture: Secondary | ICD-10-CM

## 2021-10-25 DIAGNOSIS — R030 Elevated blood-pressure reading, without diagnosis of hypertension: Secondary | ICD-10-CM | POA: Diagnosis present

## 2021-10-25 DIAGNOSIS — D509 Iron deficiency anemia, unspecified: Secondary | ICD-10-CM | POA: Diagnosis present

## 2021-10-25 HISTORY — DX: Diverticulitis of intestine, part unspecified, without perforation or abscess without bleeding: K57.92

## 2021-10-25 LAB — COMPREHENSIVE METABOLIC PANEL
ALT: 29 U/L (ref 0–55)
AST (SGOT): 33 U/L (ref 10–42)
Albumin/Globulin Ratio: 1.17 Ratio (ref 0.80–2.00)
Albumin: 3.5 gm/dL (ref 3.5–5.0)
Alkaline Phosphatase: 145 U/L (ref 40–145)
Anion Gap: 12.5 mMol/L (ref 7.0–18.0)
BUN / Creatinine Ratio: 13.7 Ratio (ref 10.0–30.0)
BUN: 16 mg/dL (ref 7–22)
Bilirubin, Total: 0.6 mg/dL (ref 0.1–1.2)
CO2: 19 mMol/L — ABNORMAL LOW (ref 20–30)
Calcium: 7.9 mg/dL — ABNORMAL LOW (ref 8.5–10.5)
Chloride: 104 mMol/L (ref 98–110)
Creatinine: 1.17 mg/dL (ref 0.80–1.30)
EGFR: 73 mL/min/{1.73_m2} (ref 60–150)
Globulin: 3 gm/dL (ref 2.0–4.0)
Glucose: 110 mg/dL — ABNORMAL HIGH (ref 71–99)
Osmolality Calculated: 266 mOsm/kg — ABNORMAL LOW (ref 275–300)
Potassium: 3.5 mMol/L (ref 3.5–5.3)
Protein, Total: 6.5 gm/dL (ref 6.0–8.3)
Sodium: 132 mMol/L — ABNORMAL LOW (ref 136–147)

## 2021-10-25 LAB — CBC AND DIFFERENTIAL
Basophils %: 0 % (ref 0.0–3.0)
Basophils Absolute: 0 10*3/uL (ref 0.0–0.3)
Eosinophils %: 0.4 % (ref 0.0–7.0)
Eosinophils Absolute: 0.1 10*3/uL (ref 0.0–0.8)
Hematocrit: 32.6 % — ABNORMAL LOW (ref 39.0–52.5)
Hemoglobin: 10.4 gm/dL — ABNORMAL LOW (ref 13.0–17.5)
Lymphocytes Absolute: 0.5 10*3/uL — ABNORMAL LOW (ref 0.6–5.1)
Lymphocytes: 3.5 % — ABNORMAL LOW (ref 15.0–46.0)
MCH: 29 pg (ref 28–35)
MCHC: 32 gm/dL (ref 32–36)
MCV: 90 fL (ref 80–100)
MPV: 8 fL (ref 6.0–10.0)
Monocytes Absolute: 1.4 10*3/uL (ref 0.1–1.7)
Monocytes: 10.2 % (ref 3.0–15.0)
Neutrophils %: 85.8 % — ABNORMAL HIGH (ref 42.0–78.0)
Neutrophils Absolute: 11.4 10*3/uL — ABNORMAL HIGH (ref 1.7–8.6)
PLT CT: 200 10*3/uL (ref 130–440)
RBC: 3.62 10*6/uL — ABNORMAL LOW (ref 4.00–5.70)
RDW: 17.4 % — ABNORMAL HIGH (ref 11.0–14.0)
WBC: 13.3 10*3/uL — ABNORMAL HIGH (ref 4.0–11.0)

## 2021-10-25 LAB — VH XPERT XPRESS © COV-2/FLU/RSV PLUS
Date of Onset: 20221031
Does patient reside in a congregate care setting?: NEGATIVE
Influenza A RNA: POSITIVE — CR
Influenza B RNA: NEGATIVE
Is patient employed in a healthcare setting?: NEGATIVE
Is the patient pregnant?: NEGATIVE
RSV RNA: NEGATIVE
SARS-CoV-2 RNA: NEGATIVE

## 2021-10-25 LAB — ECG 12-LEAD
P Wave Axis: 26 deg
P-R Interval: 179 ms
Patient Age: 55 years
Q-T Interval(Corrected): 442 ms
Q-T Interval: 339 ms
QRS Axis: 14 deg
QRS Duration: 91 ms
T Axis: 51 years
Ventricular Rate: 102 //min

## 2021-10-25 LAB — VH EXTRA SPECIMEN LABEL

## 2021-10-25 LAB — PT/INR
PT INR: 1.3 — ABNORMAL HIGH (ref 0.9–1.1)
PT: 13 s — ABNORMAL HIGH (ref 9.4–11.5)

## 2021-10-25 LAB — TROPONIN I: Troponin I: 0.01 ng/mL (ref 0.00–0.02)

## 2021-10-25 LAB — APTT: aPTT: 24.6 s (ref 24.0–34.0)

## 2021-10-25 LAB — TYPE AND SCREEN
AB Screen: NEGATIVE
ABO Rh: AB POS

## 2021-10-25 LAB — ETHANOL: Alcohol: <10 mg/dL (ref 0–9)

## 2021-10-25 LAB — AMYLASE: Amylase: 55 U/L (ref 30–135)

## 2021-10-25 LAB — LACTIC ACID, PLASMA: Lactic Acid: 0.8 mMol/L (ref 0.5–1.9)

## 2021-10-25 MED ORDER — LIDOCAINE VISCOUS HCL 2 % MT SOLN
OROMUCOSAL | Status: AC
Start: 2021-10-25 — End: ?
  Filled 2021-10-25: qty 15

## 2021-10-25 MED ORDER — ONDANSETRON HCL 4 MG/2ML IJ SOLN
INTRAMUSCULAR | Status: AC
Start: 2021-10-25 — End: ?
  Filled 2021-10-25: qty 2

## 2021-10-25 MED ORDER — HYDROMORPHONE HCL 0.5 MG/0.5 ML IJ SOLN
0.5000 mg | Freq: Once | INTRAMUSCULAR | Status: AC
Start: 2021-10-25 — End: 2021-10-25
  Administered 2021-10-25: 23:00:00 0.5 mg via INTRAVENOUS

## 2021-10-25 MED ORDER — ALUM & MAG HYDROXIDE-SIMETH 200-200-20 MG/5ML PO SUSP
ORAL | Status: AC
Start: 2021-10-25 — End: ?
  Filled 2021-10-25: qty 30

## 2021-10-25 MED ORDER — ACETAMINOPHEN 500 MG PO TABS
ORAL_TABLET | ORAL | Status: AC
Start: 2021-10-25 — End: ?
  Filled 2021-10-25: qty 2

## 2021-10-25 MED ORDER — KETOROLAC TROMETHAMINE 15 MG/ML IJ SOLN
15.0000 mg | Freq: Once | INTRAMUSCULAR | Status: AC
Start: 2021-10-25 — End: 2021-10-25
  Administered 2021-10-25: 23:00:00 15 mg via INTRAVENOUS

## 2021-10-25 MED ORDER — KETOROLAC TROMETHAMINE 15 MG/ML IJ SOLN
INTRAMUSCULAR | Status: AC
Start: 2021-10-25 — End: ?
  Filled 2021-10-25: qty 1

## 2021-10-25 MED ORDER — LIDOCAINE 5 % EX PTCH
1.0000 | MEDICATED_PATCH | CUTANEOUS | Status: DC
Start: 2021-10-25 — End: 2021-10-29
  Administered 2021-10-25 – 2021-10-28 (×4): 1 via TRANSDERMAL
  Filled 2021-10-25 (×4): qty 1

## 2021-10-25 MED ORDER — DICYCLOMINE HCL 10 MG/5ML PO SOLN
ORAL | Status: AC
Start: 2021-10-25 — End: ?
  Filled 2021-10-25: qty 5

## 2021-10-25 MED ORDER — HYDROMORPHONE HCL 0.5 MG/0.5 ML IJ SOLN
INTRAMUSCULAR | Status: AC
Start: 2021-10-25 — End: ?
  Filled 2021-10-25: qty 0.5

## 2021-10-25 MED ORDER — IOHEXOL 350 MG/ML IV SOLN
100.0000 mL | Freq: Once | INTRAVENOUS | Status: AC | PRN
Start: 2021-10-25 — End: 2021-10-25
  Administered 2021-10-25: 22:00:00 100 mL via INTRAVENOUS

## 2021-10-25 MED ORDER — ACETAMINOPHEN 500 MG PO TABS
1000.0000 mg | ORAL_TABLET | Freq: Once | ORAL | Status: AC
Start: 2021-10-25 — End: 2021-10-25
  Administered 2021-10-25: 20:00:00 1000 mg via ORAL

## 2021-10-25 MED ORDER — VH SODIUM CHLORIDE 0.9 % IV BOLUS
1000.0000 mL | Freq: Once | INTRAVENOUS | Status: AC
Start: 2021-10-25 — End: 2021-10-26
  Administered 2021-10-25: 23:00:00 1000 mL via INTRAVENOUS

## 2021-10-25 MED ORDER — VH GI COCKTAIL WITH DICYCLOMINE (SIMPLE)
45.0000 mL | Freq: Once | ORAL | Status: AC
Start: 2021-10-25 — End: 2021-10-25
  Administered 2021-10-25: 23:00:00 45 mL via ORAL

## 2021-10-25 MED ORDER — VH SODIUM CHLORIDE 0.9 % IV BOLUS
1000.0000 mL | Freq: Once | INTRAVENOUS | Status: AC
Start: 2021-10-25 — End: 2021-10-25
  Administered 2021-10-25: 20:00:00 1000 mL via INTRAVENOUS
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
## 2021-10-26 ENCOUNTER — Observation Stay: Payer: Worker's Comp, Other unspecified

## 2021-10-26 ENCOUNTER — Encounter: Payer: Self-pay | Admitting: Student in an Organized Health Care Education/Training Program

## 2021-10-26 DIAGNOSIS — J101 Influenza due to other identified influenza virus with other respiratory manifestations: Secondary | ICD-10-CM | POA: Insufficient documentation

## 2021-10-26 LAB — VH URINE DRUG SCREEN - NO CONFIRMATION
Propoxyphene: NEGATIVE
Urine Amphetamine: NEGATIVE
Urine Barbiturates: NEGATIVE
Urine Benzodiazepines: NEGATIVE
Urine Buprenorphine: NEGATIVE
Urine Cannabinoids: NEGATIVE
Urine Cocaine: NEGATIVE
Urine Creatinine Random: 68 mg/dL
Urine Ecstasy Screen: NEGATIVE
Urine Fentanyl Screen: NEGATIVE
Urine Methadone Screen: NEGATIVE
Urine Opiates: NEGATIVE
Urine Oxycodone: NEGATIVE
Urine Phencyclidine: NEGATIVE
Urine Specific Gravity: 1.011 (ref 1.001–1.040)
pH, Urine: 5.5 pH (ref 5.0–8.0)

## 2021-10-26 LAB — COMPREHENSIVE METABOLIC PANEL
ALT: 26 U/L (ref 0–55)
AST (SGOT): 27 U/L (ref 10–42)
Albumin/Globulin Ratio: 1.29 Ratio (ref 0.80–2.00)
Albumin: 3.1 gm/dL — ABNORMAL LOW (ref 3.5–5.0)
Alkaline Phosphatase: 130 U/L (ref 40–145)
Anion Gap: 12.2 mMol/L (ref 7.0–18.0)
BUN / Creatinine Ratio: 18.1 Ratio (ref 10.0–30.0)
BUN: 17 mg/dL (ref 7–22)
Bilirubin, Total: 0.5 mg/dL (ref 0.1–1.2)
CO2: 17 mMol/L — ABNORMAL LOW (ref 20–30)
Calcium: 7.4 mg/dL — ABNORMAL LOW (ref 8.5–10.5)
Chloride: 107 mMol/L (ref 98–110)
Creatinine: 0.94 mg/dL (ref 0.80–1.30)
EGFR: 95 mL/min/{1.73_m2} (ref 60–150)
Globulin: 2.4 gm/dL (ref 2.0–4.0)
Glucose: 93 mg/dL (ref 71–99)
Osmolality Calculated: 268 mOsm/kg — ABNORMAL LOW (ref 275–300)
Potassium: 3.2 mMol/L — ABNORMAL LOW (ref 3.5–5.3)
Protein, Total: 5.5 gm/dL — ABNORMAL LOW (ref 6.0–8.3)
Sodium: 133 mMol/L — ABNORMAL LOW (ref 136–147)

## 2021-10-26 LAB — CBC
Hematocrit: 29.4 % — ABNORMAL LOW (ref 39.0–52.5)
Hemoglobin: 9.4 gm/dL — ABNORMAL LOW (ref 13.0–17.5)
MCH: 29 pg (ref 28–35)
MCHC: 32 gm/dL (ref 32–36)
MCV: 92 fL (ref 80–100)
MPV: 7.9 fL (ref 6.0–10.0)
PLT CT: 187 10*3/uL (ref 130–440)
RBC: 3.21 10*6/uL — ABNORMAL LOW (ref 4.00–5.70)
RDW: 17.5 % — ABNORMAL HIGH (ref 11.0–14.0)
WBC: 7.8 10*3/uL (ref 4.0–11.0)

## 2021-10-26 LAB — VH URINALYSIS WITH MICROSCOPIC AND CULTURE IF INDICATED
Bilirubin, UA: NEGATIVE
Glucose, UA: NEGATIVE mg/dL
Ketones UA: NEGATIVE mg/dL
Leukocyte Esterase, UA: NEGATIVE Leu/uL
Nitrite, UA: NEGATIVE
Protein, UR: NEGATIVE mg/dL
RBC, UA: 2 /hpf (ref 0–4)
Squam Epithel, UA: <1 /hpf (ref 0–2)
Urine Specific Gravity: 1.014 (ref 1.001–1.040)
Urobilinogen, UA: NORMAL mg/dL
WBC, UA: 1 /hpf (ref 0–4)
pH, Urine: 6 pH (ref 5.0–8.0)

## 2021-10-26 LAB — IRON PROFILE
% Saturation: 4 % — ABNORMAL LOW (ref 15–50)
Iron: 18 ug/dL — ABNORMAL LOW (ref 50.0–175.0)
TIBC: 400 ug/dL (ref 250–450)
Transferrin: 286 mg/dL (ref 174.0–364.0)

## 2021-10-26 LAB — MAGNESIUM: Magnesium: 1.7 mg/dL (ref 1.6–2.6)

## 2021-10-26 LAB — FERRITIN: Ferritin: 23 ng/mL (ref 21.8–274.6)

## 2021-10-26 LAB — PHOSPHORUS: Phosphorus: 4.4 mg/dL (ref 2.3–4.7)

## 2021-10-26 MED ORDER — VH POTASSIUM CHLORIDE CRYS ER 10 MEQ PO TBCR (WRAP)
40.0000 meq | EXTENDED_RELEASE_TABLET | Freq: Once | ORAL | Status: AC
Start: 2021-10-26 — End: 2021-10-26
  Administered 2021-10-26: 09:00:00 40 meq via ORAL
  Filled 2021-10-26: qty 4

## 2021-10-26 MED ORDER — MELATONIN 3 MG PO TABS
3.0000 mg | ORAL_TABLET | Freq: Every evening | ORAL | Status: DC
Start: 2021-10-26 — End: 2021-10-29
  Administered 2021-10-26 – 2021-10-28 (×3): 3 mg via ORAL
  Filled 2021-10-26 (×3): qty 1

## 2021-10-26 MED ORDER — IBUPROFEN 200 MG PO TABS
400.0000 mg | ORAL_TABLET | Freq: Once | ORAL | Status: AC | PRN
Start: 2021-10-26 — End: 2021-10-26
  Administered 2021-10-26: 04:00:00 400 mg via ORAL
  Filled 2021-10-26: qty 2

## 2021-10-26 MED ORDER — TRAMADOL HCL 50 MG PO TABS
50.0000 mg | ORAL_TABLET | Freq: Four times a day (QID) | ORAL | Status: DC | PRN
Start: 2021-10-26 — End: 2021-10-29
  Administered 2021-10-26 – 2021-10-29 (×4): 50 mg via ORAL
  Filled 2021-10-26 (×4): qty 1

## 2021-10-26 MED ORDER — ACETAMINOPHEN 160 MG/5ML PO SOLN
650.0000 mg | ORAL | Status: DC | PRN
Start: 2021-10-26 — End: 2021-10-29

## 2021-10-26 MED ORDER — ACETAMINOPHEN 325 MG PO TABS
650.0000 mg | ORAL_TABLET | ORAL | Status: DC | PRN
Start: 2021-10-26 — End: 2021-10-29
  Administered 2021-10-26 – 2021-10-28 (×2): 650 mg via ORAL
  Filled 2021-10-26 (×2): qty 2

## 2021-10-26 MED ORDER — HYDROMORPHONE HCL 0.5 MG/0.5 ML IJ SOLN
0.2000 mg | INTRAMUSCULAR | Status: DC | PRN
Start: 2021-10-26 — End: 2021-10-29
  Administered 2021-10-26 – 2021-10-29 (×10): 0.2 mg via INTRAVENOUS
  Filled 2021-10-26 (×10): qty 0.5

## 2021-10-26 MED ORDER — SODIUM CHLORIDE (PF) 0.9 % IJ SOLN
0.4000 mg | INTRAMUSCULAR | Status: DC | PRN
Start: 2021-10-25 — End: 2021-10-29

## 2021-10-26 MED ORDER — SODIUM CHLORIDE 0.9 % IV SOLN
INTRAVENOUS | Status: DC
Start: 2021-10-26 — End: 2021-10-27

## 2021-10-26 MED ORDER — ACETAMINOPHEN 650 MG RE SUPP
650.0000 mg | RECTAL | Status: DC | PRN
Start: 2021-10-26 — End: 2021-10-29

## 2021-10-26 MED ORDER — OSELTAMIVIR PHOSPHATE 75 MG PO CAPS
75.0000 mg | ORAL_CAPSULE | Freq: Two times a day (BID) | ORAL | Status: DC
Start: 2021-10-26 — End: 2021-10-29
  Administered 2021-10-26 – 2021-10-29 (×7): 75 mg via ORAL
  Filled 2021-10-26 (×10): qty 1

## 2021-10-26 MED ORDER — VH POTASSIUM CHLORIDE CRYS ER 10 MEQ PO TBCR (WRAP)
40.0000 meq | EXTENDED_RELEASE_TABLET | Freq: Once | ORAL | Status: AC
Start: 2021-10-26 — End: 2021-10-26
  Administered 2021-10-26: 13:00:00 40 meq via ORAL
  Filled 2021-10-26: qty 4

## 2021-10-26 MED ORDER — SODIUM CHLORIDE (PF) 0.9 % IJ SOLN
3.0000 mL | Freq: Two times a day (BID) | INTRAMUSCULAR | Status: DC
Start: 2021-10-26 — End: 2021-10-29
  Administered 2021-10-26 – 2021-10-29 (×7): 3 mL via INTRAVENOUS

## 2021-10-26 MED ORDER — FERROUS SULFATE 325 (65 FE) MG PO TABS
325.0000 mg | ORAL_TABLET | Freq: Every morning | ORAL | Status: DC
Start: 2021-10-26 — End: 2021-10-29
  Administered 2021-10-27 – 2021-10-29 (×3): 325 mg via ORAL
  Filled 2021-10-26 (×3): qty 1

## 2021-10-26 MED ORDER — GADOTERATE MEGLUMINE 7.5 MMOL/15ML IV SOLN
15.0000 mL | Freq: Once | INTRAVENOUS | Status: AC | PRN
Start: 2021-10-26 — End: 2021-10-26
  Administered 2021-10-26: 23:00:00 7.5 mmol via INTRAVENOUS

## 2021-10-26 MED ORDER — HYDROCODONE-ACETAMINOPHEN 10-325 MG PO TABS
1.0000 | ORAL_TABLET | Freq: Four times a day (QID) | ORAL | Status: DC | PRN
Start: 2021-10-26 — End: 2021-10-27
  Administered 2021-10-26 – 2021-10-27 (×3): 1 via ORAL
  Filled 2021-10-26 (×3): qty 1

## 2021-10-26 NOTE — Teleconsult (Signed)
Telemed: patient in pain 8 out of 10, no pain medication on file. please add prn medication, thank you    Plan: Chart reviewed, motrin ordered for pain once PRN
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
## 2021-10-26 NOTE — Plan of Care (Addendum)
NURSE NOTE SUMMARY     Patient Name: Tyler Rollins,Tyler Rollins   Attending Physician: Rickard Rhymes, MD   Primary Care Physician: Marisa Sprinkles, MD   Date of Admission:   10/25/2021   Today's date:   10/26/2021 LOS: 0 days      0250 Patient admitted to unit, AOX4, c/o pain 8/10; notify provider via telemed for pain medication orders, call bell and phone with in reach, IVF going to [facility], patient uses urinal; informed to call for assistance before getting out of bed, discussed plan of care and medications with patient; patient verbalized understanding, patient has no questions or concerns at the moment. Oriented patient to room/equipment and informed to call for assistance. Patient aware urine samples are still needed. Will continue to monitor.      6962 UA culture and drug screen sent.    9528 patients' pain still 5/10 after receiving Advil as ordered, patient is requesting something stronger. Reached out to physician via telemed for additional orders.    4132 paged provider again via telemed for pain medication orders, awaiting orders.    4401 contacted provider via telephone for pain medications, orders placed.   Provider Notifications:      Rapid Response Notifications:  Mobility:        PMP Activity: Step 1 - Bedrest (10/26/2021  2:50 AM)       Weight tracking:  Family Dynamic:     Last 3 Weights for the past 72 hrs (Last 3 readings):   Weight   10/26/21 0248 88.6 kg (195 lb 5.2 oz)       Recent Vitals:  Active Problems:     BP 129/80   Pulse 70   Temp 98.2 F (36.8 C) (Temporal)   Resp 18   Ht 1.803 m (5\' 11" )   Wt 88.6 kg (195 lb 5.2 oz)   SpO2 97%   BMI 27.24 kg/m          Active Problems:    Influenza A        Problem: Moderate/High Fall Risk Score >5  Goal: Patient will remain free of falls  Outcome: Progressing  Flowsheets (Taken 10/26/2021 0250)  VH Moderate Risk (6-13):   ALL REQUIRED LOW INTERVENTIONS   YELLOW NON-SKID SLIPPERS   INITIATE YELLOW "FALL RISK" SIGNAGE   YELLOW "FALL RISK" ARM BAND   USE OF  BED EXIT ALARM IF PATIENT IS CONFUSED OR IMPULSIVE. PLACE RESET BED ALARM SIGN ABOVE BED   PLACE FALL RISK LEVEL ON WHITE BOARD FOR COMMUNICATION PURPOSES IN PATIENT'S ROOM   Include family/significant other in multidisciplinary discussion regarding plan of care as appropriate   Remain with patient during toileting   Request PT/OT therapy consult order from Physician for patients with gait/mobility impairment   Use assistive devices   Use chair-pad alarm device     Problem: Droplet Isolation  Goal: Prevent transmission of Influenza(FLU) while caring for patient in isolation  Outcome: Progressing  Flowsheets (Taken 10/26/2021 0344)  Is appropriate isolation sign and PPE outside Patient Room?: Yes  Provided Patient and family education: Verbal education  Strict Hand Hygiene: Yes  Donning and doffing PPE properly both Staff and Visitors: Yes  Multi-dose Medications stored in plastic bags?: Yes     Problem: Compromised Tissue integrity  Goal: Damaged tissue is healing and protected  Outcome: Progressing  Flowsheets (Taken 10/26/2021 0344)  Damaged tissue is healing and protected: Monitor/assess Braden scale every shift  Goal: Nutritional status is improving  Outcome: Progressing  Flowsheets (Taken 10/26/2021 0344)  Nutritional status is improving: Allow adequate time for meals

## 2021-10-26 NOTE — Plan of Care (Addendum)
NURSE NOTE SUMMARY  [facility] [address]   Patient Name: Tyler Rollins   Attending Physician: Valeta Harms, MD   Today's date:   10/26/2021 LOS: 0 days   Shift Summary:                                                              0700:Assumed care of pt. Assessment complete. IVs intact. Pt states his pain is still at a 7, tramadol given. No other needs expressed. Family at bed side. Call bell within reach and bed alarm on.  1205: PT in room to place TLSO brace, per xray they will not do image until that pt is seen by neurology.    Pt resting in bed at this time. Pain has been controlled with oral and IV medications. No needs expressed. Family at bed side. Call bell within reach and bed alarm on.   Provider Notifications:        Rapid Response Notifications:  Mobility:      PMP Activity: Step 6 - Walks in Room (10/26/2021  9:18 AM)     Weight tracking:  Family Dynamic:   Last 3 Weights for the past 72 hrs (Last 3 readings):   Weight   10/26/21 0248 88.6 kg (195 lb 5.2 oz)             Last Bowel Movement   Last BM Date: 10/25/21        Problem: Pain interferes with ability to perform ADL  Goal: Pain at adequate level as identified by patient  Outcome: Progressing  Flowsheets (Taken 10/26/2021 0922)  Pain at adequate level as identified by patient:   Identify patient comfort function goal   Assess for risk of opioid induced respiratory depression, including snoring/sleep apnea. Alert healthcare team of risk factors identified.   Assess pain on admission, during daily assessment and/or before any "as needed" intervention(s)   Reassess pain within 30-60 minutes of any procedure/intervention, per Pain Assessment, Intervention, Reassessment (AIR) Cycle   Evaluate if patient comfort function goal is met   Evaluate patient's satisfaction with pain management progress   Offer non-pharmacological pain management interventions   Consult/collaborate with Physical Therapy, Occupational Therapy,  and/or Speech Therapy   Include patient/patient care companion in decisions related to pain management as needed

## 2021-10-26 NOTE — Plan of Care (Addendum)
NURSE NOTE SUMMARY     Patient Name: Tyler Rollins,Tyler Rollins   Attending Physician: Valeta Harms, MD   Primary Care Physician: Marisa Sprinkles, MD   Date of Admission:   10/25/2021   Today's date:   10/26/2021 LOS: 0 days   Shift Summary:                                                              Received report on patient.  Patient AOX4, pain 3/10 tolerable, call bell and phone with in reach, IVF going to [facility], uses the urinal, informed to call for assistance before getting out of bed, discussed shift plan with patient; patient verbalized understanding, patient asking for sleeping pill; provider notified via telemed awaiting orders. Will continue to monitor.     1934 Melatonin ordered or sleep   Provider Notifications:      Rapid Response Notifications:  Mobility:        PMP Activity: Step 3 - Bed Mobility (10/26/2021 12:20 PM)       Weight tracking:  Family Dynamic:     Last 3 Weights for the past 72 hrs (Last 3 readings):   Weight   10/26/21 0248 88.6 kg (195 lb 5.2 oz)       Recent Vitals:  Active Problems:     BP 130/87   Pulse 72   Temp 98.2 F (36.8 C) (Temporal)   Resp 17   Ht 1.803 m (5\' 11" )   Wt 88.6 kg (195 lb 5.2 oz)   SpO2 97%   BMI 27.24 kg/m          Active Problems:    Influenza A                    Problem: Moderate/High Fall Risk Score >5  Goal: Patient will remain free of falls  Outcome: Progressing  Flowsheets (Taken 10/26/2021 0918 by Edman Circle, RN)  VH Moderate Risk (6-13):   ALL REQUIRED LOW INTERVENTIONS   INITIATE YELLOW "FALL RISK" SIGNAGE   YELLOW NON-SKID SLIPPERS   USE OF BED EXIT ALARM IF PATIENT IS CONFUSED OR IMPULSIVE. PLACE RESET BED ALARM SIGN ABOVE BED   YELLOW "FALL RISK" ARM BAND   PLACE FALL RISK LEVEL ON WHITE BOARD FOR COMMUNICATION PURPOSES IN PATIENT'S ROOM   Include family/significant other in multidisciplinary discussion regarding plan of care as appropriate   Remain with patient during toileting   Request PT/OT therapy consult order from Physician for patients  with gait/mobility impairment   Use assistive devices   Use chair-pad alarm device     Problem: Droplet Isolation  Goal: Prevent transmission of Influenza(FLU) while caring for patient in isolation  Outcome: Progressing  Flowsheets (Taken 10/26/2021 1930)  Is appropriate isolation sign and PPE outside Patient Room?: Yes  Provided Patient and family education: Verbal education  Strict Hand Hygiene: Yes  Donning and doffing PPE properly both Staff and Visitors: Yes  Multi-dose Medications stored in plastic bags?: Yes     Problem: Compromised Tissue integrity  Goal: Damaged tissue is healing and protected  Outcome: Progressing  Flowsheets (Taken 10/26/2021 0344)  Damaged tissue is healing and protected: Monitor/assess Braden scale every shift  Goal: Nutritional status is improving  Outcome: Progressing  Flowsheets (Taken 10/26/2021 0344)  Nutritional  status is improving: Allow adequate time for meals     Problem: Pain interferes with ability to perform ADL  Goal: Pain at adequate level as identified by patient  Outcome: Progressing  Flowsheets (Taken 10/26/2021 0922 by Edman Circle, RN)  Pain at adequate level as identified by patient:   Identify patient comfort function goal   Assess for risk of opioid induced respiratory depression, including snoring/sleep apnea. Alert healthcare team of risk factors identified.   Assess pain on admission, during daily assessment and/or before any "as needed" intervention(s)   Reassess pain within 30-60 minutes of any procedure/intervention, per Pain Assessment, Intervention, Reassessment (AIR) Cycle   Evaluate if patient comfort function goal is met   Evaluate patient's satisfaction with pain management progress   Offer non-pharmacological pain management interventions   Consult/collaborate with Physical Therapy, Occupational Therapy, and/or Speech Therapy   Include patient/patient care companion in decisions related to pain management as needed     Problem: Side Effects from Pain  Analgesia  Goal: Patient will experience minimal side effects of analgesic therapy  Outcome: Progressing  Flowsheets (Taken 10/26/2021 1930)  Patient will experience minimal side effects of analgesic therapy:   Monitor/assess patient's respiratory status (RR depth, effort, breath sounds)   Assess for changes in cognitive function
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
## 2021-10-26 NOTE — OT Plan of Care Note (Signed)
[facility]  [facility]  [address]  [place] [address]  Department of Rehabilitation Services  [phone]    BENUEL LY CSN: [id number]  [facility] [address] [id number]/4542-A    Occupational Therapy General Note  Time: 1500    Attempted to see pt for OT evaluation x 2 this date. Pt requiring brace fit and upright imaging this morning. As of 1500 awaiting imaging, MRI and neurosurgery consult. Will continue to follow. H/M.    Team communication: RN, Chart review, PT    Plan: Will follow up tomorrow per POC.     Ainslee Sou R Jamale Spangler, MS, OTR/L

## 2021-10-26 NOTE — Teleconsult (Signed)
Notified "Patient requesting medication for sleep."  - melatonin added

## 2021-10-27 ENCOUNTER — Observation Stay: Payer: Worker's Comp, Other unspecified

## 2021-10-27 LAB — COMPREHENSIVE METABOLIC PANEL
ALT: 24 U/L (ref 0–55)
AST (SGOT): 24 U/L (ref 10–42)
Albumin/Globulin Ratio: 1.2 Ratio (ref 0.80–2.00)
Albumin: 3 gm/dL — ABNORMAL LOW (ref 3.5–5.0)
Alkaline Phosphatase: 124 U/L (ref 40–145)
Anion Gap: 12 mMol/L (ref 7.0–18.0)
BUN / Creatinine Ratio: 13.7 Ratio (ref 10.0–30.0)
BUN: 10 mg/dL (ref 7–22)
Bilirubin, Total: 0.6 mg/dL (ref 0.1–1.2)
CO2: 19 mMol/L — ABNORMAL LOW (ref 20–30)
Calcium: 7.5 mg/dL — ABNORMAL LOW (ref 8.5–10.5)
Chloride: 106 mMol/L (ref 98–110)
Creatinine: 0.73 mg/dL — ABNORMAL LOW (ref 0.80–1.30)
EGFR: 106 mL/min/{1.73_m2} (ref 60–150)
Globulin: 2.5 gm/dL (ref 2.0–4.0)
Glucose: 87 mg/dL (ref 71–99)
Osmolality Calculated: 265 mOsm/kg — ABNORMAL LOW (ref 275–300)
Potassium: 4 mMol/L (ref 3.5–5.3)
Protein, Total: 5.5 gm/dL — ABNORMAL LOW (ref 6.0–8.3)
Sodium: 133 mMol/L — ABNORMAL LOW (ref 136–147)

## 2021-10-27 LAB — CBC
Hematocrit: 28.1 % — ABNORMAL LOW (ref 39.0–52.5)
Hemoglobin: 9.3 gm/dL — ABNORMAL LOW (ref 13.0–17.5)
MCH: 30 pg (ref 28–35)
MCHC: 33 gm/dL (ref 32–36)
MCV: 90 fL (ref 80–100)
MPV: 7.8 fL (ref 6.0–10.0)
PLT CT: 178 10*3/uL (ref 130–440)
RBC: 3.12 10*6/uL — ABNORMAL LOW (ref 4.00–5.70)
RDW: 17.1 % — ABNORMAL HIGH (ref 11.0–14.0)
WBC: 8.3 10*3/uL (ref 4.0–11.0)

## 2021-10-27 LAB — MAGNESIUM: Magnesium: 1.9 mg/dL (ref 1.6–2.6)

## 2021-10-27 LAB — PHOSPHORUS: Phosphorus: 2.3 mg/dL (ref 2.3–4.7)

## 2021-10-27 MED ORDER — HYDROCODONE-ACETAMINOPHEN 10-325 MG PO TABS
1.0000 | ORAL_TABLET | ORAL | Status: DC | PRN
Start: 2021-10-27 — End: 2021-10-29
  Administered 2021-10-27 – 2021-10-29 (×10): 1 via ORAL
  Filled 2021-10-27 (×10): qty 1
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
## 2021-10-27 NOTE — Plan of Care (Addendum)
NURSE NOTE SUMMARY  [facility] [address]   Patient Name: Tyler Rollins   Attending Physician: Valeta Harms, MD   Today's date:   10/27/2021 LOS: 0 days   Shift Summary:                                                              0700- Received report, took over pt care, pt resting in bed, pt tolerated medications well, call bell in reach.    Droplet precaution for the flu.  Pt walks in room.  IV intact.  Educated pt about importance of IS and how to perform, pt was accepting of information.   Pt is voiding WNL in the BR/urinal.  Neuro-vascular assessment is intact.  Vital  signs reviewed:    Temp: 97.9 F (36.6 C), Heart Rate: 79, Resp Rate: 17, BP: 143/89    Pt tolerating po well, call bell in reach, pt voices no concerns at this time    0935: Pt taken down to XRAY by transport.     1100: Pt back from XRAY, tolerated well.     1845: Patient resting quietly in bed. Pt NPO at midnight for surgery tomorrow. Call bell in reach, no questions at this time. Report given to oncoming RN. End of shift.     Provider Notifications:        Rapid Response Notifications:  Mobility:      PMP Activity: Step 6 - Walks in Room (10/27/2021  8:00 AM)     Weight tracking:  Family Dynamic:   Last 3 Weights for the past 72 hrs (Last 3 readings):   Weight   10/26/21 0248 88.6 kg (195 lb 5.2 oz)             Last Bowel Movement   Last BM Date: 10/26/21        Problem: Moderate/High Fall Risk Score >5  Goal: Patient will remain free of falls  Outcome: Progressing  Flowsheets (Taken 10/27/2021 0800)  VH Moderate Risk (6-13):   INITIATE YELLOW "FALL RISK" SIGNAGE   ALL REQUIRED LOW INTERVENTIONS   YELLOW NON-SKID SLIPPERS   YELLOW "FALL RISK" ARM BAND   USE OF BED EXIT ALARM IF PATIENT IS CONFUSED OR IMPULSIVE. PLACE RESET BED ALARM SIGN ABOVE BED   PLACE FALL RISK LEVEL ON WHITE BOARD FOR COMMUNICATION PURPOSES IN PATIENT'S ROOM     Problem: Droplet Isolation  Goal: Prevent transmission of Influenza(FLU)  while caring for patient in isolation  Outcome: Progressing  Flowsheets (Taken 10/26/2021 1930 by Weldon Picking, RN)  Is appropriate isolation sign and PPE outside Patient Room?: Yes  Provided Patient and family education: Verbal education  Strict Hand Hygiene: Yes  Donning and doffing PPE properly both Staff and Visitors: Yes  Multi-dose Medications stored in plastic bags?: Yes     Problem: Compromised Tissue integrity  Goal: Damaged tissue is healing and protected  Outcome: Progressing  Flowsheets (Taken 10/27/2021 0938)  Damaged tissue is healing and protected:   Monitor/assess Braden scale every shift   Reposition patient every 2 hours and as needed unless able to reposition self   Increase activity as tolerated/progressive mobility   Keep intact skin clean and dry   Monitor patient's hygiene practices  Goal: Nutritional status is  improving  Outcome: Progressing  Flowsheets (Taken 10/26/2021 0344 by Weldon Picking, RN)  Nutritional status is improving: Allow adequate time for meals     Problem: Pain interferes with ability to perform ADL  Goal: Pain at adequate level as identified by patient  Outcome: Progressing  Flowsheets (Taken 10/26/2021 0922 by Edman Circle, RN)  Pain at adequate level as identified by patient:   Identify patient comfort function goal   Assess for risk of opioid induced respiratory depression, including snoring/sleep apnea. Alert healthcare team of risk factors identified.   Assess pain on admission, during daily assessment and/or before any "as needed" intervention(s)   Reassess pain within 30-60 minutes of any procedure/intervention, per Pain Assessment, Intervention, Reassessment (AIR) Cycle   Evaluate if patient comfort function goal is met   Evaluate patient's satisfaction with pain management progress   Offer non-pharmacological pain management interventions   Consult/collaborate with Physical Therapy, Occupational Therapy, and/or Speech Therapy   Include patient/patient  care companion in decisions related to pain management as needed     Problem: Side Effects from Pain Analgesia  Goal: Patient will experience minimal side effects of analgesic therapy  Outcome: Progressing  Flowsheets (Taken 10/26/2021 1930 by Weldon Picking, RN)  Patient will experience minimal side effects of analgesic therapy:   Monitor/assess patient's respiratory status (RR depth, effort, breath sounds)   Assess for changes in cognitive function

## 2021-10-27 NOTE — Progress Notes (Signed)
NEUROSURGERY PROGRESS NOTE  Name:  Tyler Rollins, Tyler Rollins    MR#:    [id number]   DOB:  1966/09/28     Hospital Day:  0   Attending:  Valeta Harms, MD   ______________________________________________________________________    Neurosurgical Diagnosis/Procedure(s):   spine fracture    Assessment/Plan:  Stable  Proceed with Kyphoplasty as planned.Patient does not have evidence of malignancy in vertebrae. Can not explain the absence of fresh edema in the patient's fractures. He has 7/10 pain when upright, kyphoplasty is appropriate.  - continue care as established, no new recommendations  - no changes in level of care  - stable medical issues  - no nursing changes  - mobilize, activity as tolerated  - no new imaging  - no diet recommendations  - defer medications to primary team  - no new recommendations for prophylaxis    Active Problems:   Active Hospital Problems    Diagnosis    Influenza A     ______________________________________________________________________    Events:  MRI shows no enhancement or evidence of malignancy.    Subjective Data:   back pain    Objective Data:    Vitals:   Temp:  [97.9 F (36.6 C)-99.7 F (37.6 C)]   Heart Rate:  [79-85]   Resp Rate:  [17-20]   BP: (143-148)/(88-89)   SpO2:  [97 %]     Exam:        awake and alert        5/5 motor throughout        sensation not tested            Labs:  Results       Procedure Component Value Units Date/Time    Comprehensive metabolic panel [[id number]]  (Abnormal) Collected: 10/27/21 0402    Specimen: Plasma Updated: 10/27/21 0509     Sodium 133 mMol/L      Potassium 4.0 mMol/L      Chloride 106 mMol/L      CO2 19 mMol/L      Calcium 7.5 mg/dL      Glucose 87 mg/dL      Creatinine 5.62 mg/dL      BUN 10 mg/dL      Protein, Total 5.5 gm/dL      Albumin 3.0 gm/dL      Alkaline Phosphatase 124 U/L      ALT 24 U/L      AST (SGOT) 24 U/L      Bilirubin, Total 0.6 mg/dL      Albumin/Globulin Ratio 1.20 Ratio      Anion Gap 12.0 mMol/L      BUN / Creatinine  Ratio 13.7 Ratio      EGFR 106 mL/min/1.3m2      Osmolality Calculated 265 mOsm/kg      Globulin 2.5 gm/dL     Magnesium [[id number]] Collected: 10/27/21 0402    Specimen: Plasma Updated: 10/27/21 0509     Magnesium 1.9 mg/dL     Phosphorus [[id number]] Collected: 10/27/21 0402    Specimen: Plasma Updated: 10/27/21 0509     Phosphorus 2.3 mg/dL     CBC without differential [[id number]]  (Abnormal) Collected: 10/27/21 0402    Specimen: Blood Updated: 10/27/21 0448     WBC 8.3 K/cmm      RBC 3.12 M/cmm      Hemoglobin 9.3 gm/dL      Hematocrit 72.5 %      MCV 90 fL      MCH 30  pg      MCHC 33 gm/dL      RDW 16.1 %      PLT CT 178 K/cmm      MPV 7.8 fL     Blood Culture - Venipuncture [[id number]] Collected: 10/25/21 2022    Specimen: Blood from Venipuncture Updated: 10/26/21 1435     Culture Result No Growth To Date    Blood Culture - Venipuncture [[id number]] Collected: 10/25/21 1956    Specimen: Blood from Venipuncture Updated: 10/26/21 1435     Culture Result No Growth To Date            Estimated Creatinine Clearance: 118.9 mL/min (A) (based on SCr of 0.73 mg/dL (L)).      Imaging:  XR Lumbar Spine AP And Lateral    Result Date: 10/27/2021  In-brace standing images with some AP limitations given artifacts. Osseous demineralization. Moderate L1 compression fracture deformity with increased anterior wedge compression. Stable alignment, including previous grade 1 anterolisthesis L5-S1. Associated moderate disc space narrowing and osteophytes. Unremarkable soft tissues. ReadingStation:[email or web address]    MRI Cervical Spine W WO Contrast    Result Date: 10/27/2021  MRI scan of the cervical spine demonstrating: 1. There is mild spinal stenosis at C2-C3, C3-C4, C4-C5 and C6-C7 and mild to moderate spinal stenosis at C5-C6 without clinically significant cord compression and multilevel neural foraminal narrowing as described above 2. There is mild edema within the posterior spinous ligaments at T1-T2 likely due to trauma.  ReadingStation:[id number]    MRI Thoracic Spine W WO Contrast    Result Date: 10/27/2021  MRI scan of the thoracic spine demonstrating: 1. A burst fracture of L1 with minimal encroachment anterior spinal canal 2. No metastatic disease is visualized ReadingStation:[id number]    MRI Lumbar Spine W WO Contrast    Result Date: 10/27/2021  MRI scan lumbosacral spine demonstrating: 1. Acute burst fracture of L1 with 25% loss of height. 2. At L5-S1 a 6 mm anterolisthesis, bilateral spondylolysis and moderate to severe biforaminal narrowing. 3. No metastatic disease is visualized. ReadingStation:[id number]     Nutrition assessment done in collaboration with Registered Dietitians:     ______________________________________________________________________      - I spent approximately  25 minutes on this encounter examining the patient, reviewing pertinent history, labs, radiologic tests and discussing the case with the patient, available staff and family.  Over half of this time was spent counseling.    Signed by: Benny Lennert, MD  Neurosurgery
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
## 2021-10-27 NOTE — UM Notes (Signed)
CONTINUED STAY REVIEW 10/27/2021    AUTH#:      Tyler Rollins  12-02-1966  MRN: [id number]      MD NOTES:  Acute L1 compression fracture  -CT head with no acute process, CT spine no acute process status post removal c-collar  -CT chest with concern for compression fracture of L1 anteriorly  -MRI L-spine with acute central compression fracture at L1 with about 25% loss in height  -MRI C/T/L with contrast done, pending radiology read, spoke with neurosurgery about the images and felt most likely a compression fracture but waiting f for formal radiology read  -We will discuss MRIs with the family's once formal read is done  -TLSO brace ordered and PT/OT evaluation  -Norco 10 mg increase to every 4 hours and continue as needed Dilaudid but hopeful for reduced usage so able to go home on just p.o. medications  -Discussed with him options of TLSO brace and PT/OT or IR consultation and family ultimately opted to go with IR consultation, spoke with IR here today and will be going for kyphoplasty tomorrow so n.p.o. after midnight     Influenza A  -Asymptomatic positive  -Start Tamiflu is in the hospital     Iron deficiency anemia  -Hemoglobin on arrival of 10.4 and down to 9.4 here this morning  -Ferritin low, percent sat and iron low concern for iron deficiency anemia  -Start p.o. iron  -We will need to follow-up with outpatient GI for further evaluation     Hypovolemic hypoosmolar hyponatremia  -Likely secondary to poor p.o. intake  -[place] fluids  -Improving     Overweight with BMI of 27  -Complicates all aspects of care     Disposition: PT/OT evaluation, pain control, potential kyphoplasty versus PT/OT recommendations, potential discharge today pending results      NEUROSURGERY  Neurosurgical Diagnosis/Procedure(s):   spine fracture     Assessment/Plan:  Stable  Proceed with Kyphoplasty as planned.Patient does not have evidence of malignancy in vertebrae. Can not explain the absence of fresh edema in the patient's fractures. He  has 7/10 pain when upright, kyphoplasty is appropriate.  - continue care as established, no new recommendations  - no changes in level of care  - stable medical issues  - no nursing changes  - mobilize, activity as tolerated  - no new imaging  - no diet recommendations  - defer medications to primary team  - no new recommendations for prophylaxis               Recent Labs   Lab 10/27/21  0402   WBC 8.3   Hemoglobin 9.3*   Hematocrit 28.1*   PLT CT 178          Recent Labs   Lab 10/27/21  0402   Sodium 133*   Potassium 4.0   Chloride 106   CO2 19*   BUN 10   Creatinine 0.73*   EGFR 106   Glucose 87   Calcium 7.5*          VITALS: Blood pressure 143/89, pulse 79, temperature 97.9 F (36.6 C), temperature source Temporal, resp. rate 17, height 1.803 m (5\' 11" ), weight 88.6 kg (195 lb 5.2 oz), SpO2 97 %.      Scheduled Meds:  Current Facility-Administered Medications   Medication Dose Route Frequency    ferrous sulfate  325 mg Oral QAM W/BREAKFAST    lidocaine  1 patch Transdermal Q24H    melatonin  3 mg Oral  QHS    oseltamivir  75 mg Oral Q12H SCH    sodium chloride (PF)  3 mL Intravenous Q12H SCH     Continuous Infusions:  PRN Meds:.acetaminophen **OR** acetaminophen **OR** acetaminophen, HYDROcodone-acetaminophen, HYDROmorphone, naloxone, traMADol            Griffith Citron BSN, RN  Utilization Management Review Nurse  [facility] [address]  [address]  [place], [place] [address]  [phone] office Phone, Direct and Confidential  [phone], Fax  [email or web address]

## 2021-10-28 ENCOUNTER — Inpatient Hospital Stay: Payer: Worker's Comp, Other unspecified

## 2021-10-28 LAB — COMPREHENSIVE METABOLIC PANEL
ALT: 23 U/L (ref 0–55)
AST (SGOT): 19 U/L (ref 10–42)
Albumin/Globulin Ratio: 0.97 Ratio (ref 0.80–2.00)
Albumin: 2.8 gm/dL — ABNORMAL LOW (ref 3.5–5.0)
Alkaline Phosphatase: 110 U/L (ref 40–145)
Anion Gap: 11.7 mMol/L (ref 7.0–18.0)
BUN / Creatinine Ratio: 13 Ratio (ref 10.0–30.0)
BUN: 9 mg/dL (ref 7–22)
Bilirubin, Total: 0.5 mg/dL (ref 0.1–1.2)
CO2: 22 mMol/L (ref 20–30)
Calcium: 7.9 mg/dL — ABNORMAL LOW (ref 8.5–10.5)
Chloride: 105 mMol/L (ref 98–110)
Creatinine: 0.69 mg/dL — ABNORMAL LOW (ref 0.80–1.30)
EGFR: 108 mL/min/{1.73_m2} (ref 60–150)
Globulin: 2.9 gm/dL (ref 2.0–4.0)
Glucose: 86 mg/dL (ref 71–99)
Osmolality Calculated: 268 mOsm/kg — ABNORMAL LOW (ref 275–300)
Potassium: 3.7 mMol/L (ref 3.5–5.3)
Protein, Total: 5.7 gm/dL — ABNORMAL LOW (ref 6.0–8.3)
Sodium: 135 mMol/L — ABNORMAL LOW (ref 136–147)

## 2021-10-28 LAB — CBC
Hematocrit: 28.7 % — ABNORMAL LOW (ref 39.0–52.5)
Hemoglobin: 9.5 gm/dL — ABNORMAL LOW (ref 13.0–17.5)
MCH: 30 pg (ref 28–35)
MCHC: 33 gm/dL (ref 32–36)
MCV: 90 fL (ref 80–100)
MPV: 7.6 fL (ref 6.0–10.0)
PLT CT: 194 10*3/uL (ref 130–440)
RBC: 3.17 10*6/uL — ABNORMAL LOW (ref 4.00–5.70)
RDW: 17 % — ABNORMAL HIGH (ref 11.0–14.0)
WBC: 7.1 10*3/uL (ref 4.0–11.0)

## 2021-10-28 LAB — PHOSPHORUS: Phosphorus: 2.9 mg/dL (ref 2.3–4.7)

## 2021-10-28 LAB — MAGNESIUM: Magnesium: 1.9 mg/dL (ref 1.6–2.6)

## 2021-10-28 MED ORDER — FENTANYL CITRATE (PF) 50 MCG/ML IJ SOLN (WRAP)
INTRAMUSCULAR | Status: AC | PRN
Start: 2021-10-28 — End: 2021-10-28
  Administered 2021-10-28: 50 ug via INTRAVENOUS

## 2021-10-28 MED ORDER — ENOXAPARIN SODIUM 40 MG/0.4ML IJ SOSY
40.0000 mg | PREFILLED_SYRINGE | INTRAMUSCULAR | Status: DC
Start: 2021-10-29 — End: 2021-10-29
  Administered 2021-10-29: 40 mg via SUBCUTANEOUS
  Filled 2021-10-28: qty 0.4

## 2021-10-28 MED ORDER — FENTANYL CITRATE (PF) 50 MCG/ML IJ SOLN (WRAP)
INTRAMUSCULAR | Status: AC
Start: 2021-10-28 — End: ?
  Filled 2021-10-28: qty 2

## 2021-10-28 MED ORDER — VH MIDAZOLAM HCI 2 MG/2ML IJ SOLN (IR NARRATOR)
INTRAMUSCULAR | Status: AC | PRN
Start: 2021-10-28 — End: 2021-10-28
  Administered 2021-10-28: 1 mg via INTRAVENOUS

## 2021-10-28 MED ORDER — MIDAZOLAM HCL 1 MG/ML IJ SOLN (WRAP)
INTRAMUSCULAR | Status: AC
Start: 2021-10-28 — End: ?
  Filled 2021-10-28: qty 2

## 2021-10-28 MED ORDER — CEFAZOLIN SODIUM 1 G IJ SOLR
INTRAMUSCULAR | Status: AC | PRN
Start: 2021-10-28 — End: 2021-10-28
  Administered 2021-10-28: 2 g via INTRAVENOUS

## 2021-10-28 MED ORDER — CEFAZOLIN SODIUM 1 G IJ SOLR
INTRAMUSCULAR | Status: AC
Start: 2021-10-28 — End: ?
  Filled 2021-10-28: qty 2000

## 2021-10-28 MED ORDER — BUPIVACAINE HCL (PF) 0.75 % IJ SOLN
INTRAMUSCULAR | Status: AC
Start: 2021-10-28 — End: ?
  Filled 2021-10-28: qty 10

## 2021-10-28 NOTE — Plan of Care (Addendum)
NURSE NOTE SUMMARY  [facility] [address]   Patient Name: Tyler Rollins, Tyler Rollins   Attending Physician: Valeta Harms, MD   Today's date:   10/28/2021 LOS: 1 days   Shift Summary:                                                              Assumed care of patient at 1900.  Assessment complete.  Call bell within reach.  Patient resting comfortably.  Encouraged patient to use incentive spirometer.      Provider Notifications:        Rapid Response Notifications:  Mobility:      PMP Activity: Step 3 - Bed Mobility (Patient did not wish to walk due to pain.) (10/27/2021 11:00 PM)     Weight tracking:  Family Dynamic:   Last 3 Weights for the past 72 hrs (Last 3 readings):   Weight   10/26/21 0248 88.6 kg (195 lb 5.2 oz)             Last Bowel Movement   Last BM Date: 10/26/21       Problem: Moderate/High Fall Risk Score >5  Goal: Patient will remain free of falls  Outcome: Progressing  Flowsheets (Taken 10/28/2021 0005)  VH Moderate Risk (6-13):   ALL REQUIRED LOW INTERVENTIONS   INITIATE YELLOW "FALL RISK" SIGNAGE   YELLOW NON-SKID SLIPPERS   YELLOW "FALL RISK" ARM BAND   USE OF BED EXIT ALARM IF PATIENT IS CONFUSED OR IMPULSIVE. PLACE RESET BED ALARM SIGN ABOVE BED   PLACE FALL RISK LEVEL ON WHITE BOARD FOR COMMUNICATION PURPOSES IN PATIENT'S ROOM     Problem: Droplet Isolation  Goal: Prevent transmission of Influenza(FLU) while caring for patient in isolation  Outcome: Progressing  Flowsheets (Taken 10/28/2021 0005)  Is appropriate isolation sign and PPE outside Patient Room?: Yes  Provided Patient and family education: Verbal education  Strict Hand Hygiene: Yes  Donning and doffing PPE properly both Staff and Visitors: Yes  Multi-dose Medications stored in plastic bags?: Yes

## 2021-10-28 NOTE — Consults (Signed)
Inpatient Consult to Interventional Radiology  Consult performed by: Arville Care, MD  Consult ordered by: Valeta Harms, MD    IR consult received for L1 kyphoplasty  -procedure performed(biopsy and kyphoplasty)  -pt tolerated procedure well  -no immediate complications  -procedure performed with Dr. Melanie Crazier, see his radiology report for further details    Dub Mikes, RA
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
## 2021-10-28 NOTE — Plan of Care (Addendum)
NURSE NOTE SUMMARY  [facility] [address]   Patient Name: Tyler Rollins   Attending Physician: Arville Care, MD   Today's date:   10/28/2021 LOS: 1 days   Shift Summary:                                                              0700- Received report, assumed pt care, pt resting in bed, pt tolerated medications well, call bell in reach. Patient NPO since midnight for kyphoplasty scheduled for today in IR. Preop bath completed. IV intact, INT. Pt is voiding WNL in the urinal/BR. Neuro-vascular assessment is intact. Pt AAO x4.   Vital  signs reviewed:    Temp: 97.5 F (36.4 C), Heart Rate: 64, Resp Rate: 12, BP: 129/89    Pt tolerating po well, call bell in reach, pt voices no concerns at this time    1215: Pt taken by transport to IR for kyphoplasty. Pt NPO since midnight, no anticoagulation given.     1355: Patient returned to unit from IR.     1845: Patient resting quietly in bed. Call bell in reach, no questions at this time. Report given to oncoming RN. End of shift.     Provider Notifications:        Rapid Response Notifications:  Mobility:      PMP Activity: Step 6 - Walks in Room (10/28/2021  8:30 AM)     Weight tracking:  Family Dynamic:   Last 3 Weights for the past 72 hrs (Last 3 readings):   Weight   10/26/21 0248 88.6 kg (195 lb 5.2 oz)             Last Bowel Movement   Last BM Date: 10/26/21        Problem: Moderate/High Fall Risk Score >5  Goal: Patient will remain free of falls  Outcome: Progressing  Flowsheets (Taken 10/28/2021 0005 by Estella Husk, RN)  VH Moderate Risk (6-13):   ALL REQUIRED LOW INTERVENTIONS   INITIATE YELLOW "FALL RISK" SIGNAGE   YELLOW NON-SKID SLIPPERS   YELLOW "FALL RISK" ARM BAND   USE OF BED EXIT ALARM IF PATIENT IS CONFUSED OR IMPULSIVE. PLACE RESET BED ALARM SIGN ABOVE BED   PLACE FALL RISK LEVEL ON WHITE BOARD FOR COMMUNICATION PURPOSES IN PATIENT'S ROOM     Problem: Droplet Isolation  Goal: Prevent transmission of Influenza(FLU) while  caring for patient in isolation  Outcome: Progressing  Flowsheets (Taken 10/28/2021 0005 by Estella Husk, RN)  Is appropriate isolation sign and PPE outside Patient Room?: Yes  Provided Patient and family education: Verbal education  Strict Hand Hygiene: Yes  Donning and doffing PPE properly both Staff and Visitors: Yes  Multi-dose Medications stored in plastic bags?: Yes     Problem: Compromised Tissue integrity  Goal: Damaged tissue is healing and protected  Outcome: Progressing  Flowsheets (Taken 10/27/2021 0938)  Damaged tissue is healing and protected:   Monitor/assess Braden scale every shift   Reposition patient every 2 hours and as needed unless able to reposition self   Increase activity as tolerated/progressive mobility   Keep intact skin clean and dry   Monitor patient's hygiene practices  Goal: Nutritional status is improving  Outcome: Progressing  Flowsheets (Taken 10/26/2021 0344  by Weldon Picking, RN)  Nutritional status is improving: Allow adequate time for meals     Problem: Pain interferes with ability to perform ADL  Goal: Pain at adequate level as identified by patient  Outcome: Progressing  Flowsheets (Taken 10/26/2021 0922 by Edman Circle, RN)  Pain at adequate level as identified by patient:   Identify patient comfort function goal   Assess for risk of opioid induced respiratory depression, including snoring/sleep apnea. Alert healthcare team of risk factors identified.   Assess pain on admission, during daily assessment and/or before any "as needed" intervention(s)   Reassess pain within 30-60 minutes of any procedure/intervention, per Pain Assessment, Intervention, Reassessment (AIR) Cycle   Evaluate if patient comfort function goal is met   Evaluate patient's satisfaction with pain management progress   Offer non-pharmacological pain management interventions   Consult/collaborate with Physical Therapy, Occupational Therapy, and/or Speech Therapy   Include patient/patient  care companion in decisions related to pain management as needed     Problem: Side Effects from Pain Analgesia  Goal: Patient will experience minimal side effects of analgesic therapy  Outcome: Progressing  Flowsheets (Taken 10/26/2021 1930 by Weldon Picking, RN)  Patient will experience minimal side effects of analgesic therapy:   Monitor/assess patient's respiratory status (RR depth, effort, breath sounds)   Assess for changes in cognitive function

## 2021-10-29 MED ORDER — OSELTAMIVIR PHOSPHATE 75 MG PO CAPS
75.0000 mg | ORAL_CAPSULE | Freq: Two times a day (BID) | ORAL | 0 refills | Status: AC
Start: 2021-10-29 — End: 2021-10-31

## 2021-10-29 MED ORDER — AMLODIPINE BESYLATE 5 MG PO TABS
5.0000 mg | ORAL_TABLET | Freq: Every day | ORAL | Status: DC
Start: 2021-10-29 — End: 2021-10-29
  Administered 2021-10-29: 5 mg via ORAL
  Filled 2021-10-29: qty 1

## 2021-10-29 MED ORDER — FERROUS SULFATE 325 (65 FE) MG PO TABS
325.0000 mg | ORAL_TABLET | Freq: Every morning | ORAL | 0 refills | Status: AC
Start: 2021-10-30 — End: 2021-11-29

## 2021-10-29 MED ORDER — TRAMADOL HCL 50 MG PO TABS
50.0000 mg | ORAL_TABLET | Freq: Four times a day (QID) | ORAL | 0 refills | Status: AC | PRN
Start: 2021-10-29 — End: ?

## 2021-10-29 NOTE — Progress Notes (Addendum)
[facility] [address]  [address]  [place] [address]   October 29, 2021     Patient: Tyler Rollins   Date of Birth: 09-10-1966   Date of Visit: 10/25/2021             Return to Work Status Form    Meiko Sudduth has been visiting Tyler Rollins in [facility] from 10/25/21 to 10/29/21      Patient Signature: ______________________________          Nurse Signature ______________________________

## 2021-10-29 NOTE — Plan of Care (Addendum)
NURSE NOTE SUMMARY  [facility] [address]   Patient Name: Tyler Rollins   Attending Physician: Arville Care, MD   Today's date:   10/29/2021 LOS: 2 days   Shift Summary:                                                              0700- Received report, took over pt care, pt resting in bed, call bell in reach    Pt worked with PT today:   Pain was controlled with oral medications.  Dressings are intact, small amount of serosanguinous drainage.  IV intact, INT  Pt is voiding WNL in the urinal.  Amlodipine started d/t increased BP.  Pt D/C @ 1340, IV removed, all belongings accounted, questions/concerns answered.  Neuro-vascular assessment is intact.  Vital  signs reviewed:    Temp: 97.3 F (36.3 C), Heart Rate: 63, Resp Rate: 12, BP: (!) 145/91    Pt tolerating po well, call bell in reach, pt voices no concerns at this time           Provider Notifications:        Rapid Response Notifications:  Mobility:      PMP Activity: Step 6 - Walks in Room (10/29/2021  9:00 AM)     Weight tracking:  Family Dynamic:   No data found.          Last Bowel Movement   Last BM Date: 10/26/21        Problem: Moderate/High Fall Risk Score >5  Goal: Patient will remain free of falls  Outcome: Progressing  Flowsheets (Taken 10/29/2021 0900)  VH Moderate Risk (6-13):   ALL REQUIRED LOW INTERVENTIONS   INITIATE YELLOW "FALL RISK" SIGNAGE   YELLOW NON-SKID SLIPPERS   YELLOW "FALL RISK" ARM BAND   USE OF BED EXIT ALARM IF PATIENT IS CONFUSED OR IMPULSIVE. PLACE RESET BED ALARM SIGN ABOVE BED   PLACE FALL RISK LEVEL ON WHITE BOARD FOR COMMUNICATION PURPOSES IN PATIENT'S ROOM   Include family/significant other in multidisciplinary discussion regarding plan of care as appropriate   Remain with patient during toileting   Request PT/OT therapy consult order from Physician for patients with gait/mobility impairment   Use assistive devices   Use chair-pad alarm device   Use of "STOP ask for help" sign     Problem: Droplet  Isolation  Goal: Prevent transmission of Influenza(FLU) while caring for patient in isolation  Outcome: Progressing  Flowsheets  Taken 10/29/2021 0307 by Tiffany Kocher, RN  Is appropriate isolation sign and PPE outside Patient Room?: Yes  Strict Hand Hygiene: Yes  Donning and doffing PPE properly both Staff and Visitors: Yes  Multi-dose Medications stored in plastic bags?: Yes  Taken 10/28/2021 0005 by Estella Husk, RN  Provided Patient and family education: Verbal education     Problem: Compromised Tissue integrity  Goal: Damaged tissue is healing and protected  Outcome: Progressing  Flowsheets (Taken 10/27/2021 0938 by Thana Ates, RN)  Damaged tissue is healing and protected:   Monitor/assess Braden scale every shift   Reposition patient every 2 hours and as needed unless able to reposition self   Increase activity as tolerated/progressive mobility   Keep intact skin clean and dry   Monitor patient's  hygiene practices  Goal: Nutritional status is improving  Outcome: Progressing  Flowsheets (Taken 10/26/2021 0344 by Weldon Picking, RN)  Nutritional status is improving: Allow adequate time for meals     Problem: Pain interferes with ability to perform ADL  Goal: Pain at adequate level as identified by patient  Outcome: Progressing  Flowsheets (Taken 10/26/2021 0922 by Edman Circle, RN)  Pain at adequate level as identified by patient:   Identify patient comfort function goal   Assess for risk of opioid induced respiratory depression, including snoring/sleep apnea. Alert healthcare team of risk factors identified.   Assess pain on admission, during daily assessment and/or before any "as needed" intervention(s)   Reassess pain within 30-60 minutes of any procedure/intervention, per Pain Assessment, Intervention, Reassessment (AIR) Cycle   Evaluate if patient comfort function goal is met   Evaluate patient's satisfaction with pain management progress   Offer non-pharmacological pain management  interventions   Consult/collaborate with Physical Therapy, Occupational Therapy, and/or Speech Therapy   Include patient/patient care companion in decisions related to pain management as needed     Problem: Side Effects from Pain Analgesia  Goal: Patient will experience minimal side effects of analgesic therapy  Outcome: Progressing  Flowsheets (Taken 10/26/2021 1930 by Weldon Picking, RN)  Patient will experience minimal side effects of analgesic therapy:   Monitor/assess patient's respiratory status (RR depth, effort, breath sounds)   Assess for changes in cognitive function

## 2021-10-29 NOTE — Progress Notes (Addendum)
Quick Doc  [facility] [address]   Patient Name: Tyler Rollins   Attending Physician: Arville Care, MD   Today's date:   10/29/2021 LOS: 2 days   Expected Discharge Date      Quick  Assessment:                                                              ReAdmit Risk Score: 12.53    CM Comments: (P) 11/4 RNCM CGS: Post procedure on 11/3>procedure performed(biopsy and kyphoplasty) for L1 burst fracture.  NCM spoke with pt this am to discuss [place] planning needs. Pt declines the need for a FWW to use upon [place]. States his daughter is in the area here and will provide him with transportation home. Per pt " playing phone tag " with Workmen's Comp calls.  NCM called and left msg requesting a return call from Braulio Conte, Work Comp Sports coach. Pt is hopeful of a [place] " soon, maybe today?" NCM following.                                                                                       Provider Notifications:        Haze Boyden RN [facility] BSN ONC  Orthopaedic Nurse Case Manager  [facility]  Cell : [phone]    ADDN: Orders obtained from attending MD to [place] pt today.     Unit AA to have films copied on a CD to have sent with pt upon his [place]> done.    NCM rec'ed call back from [place] who informed NCM that the adjustor for workmens comp was the one trying to contact pt. Per Nicki Guadalajara " the simplest thing is to have pt pay for any new prescriptions, save the recpt's and turn in to [employer]. Per Nicki Guadalajara the adjustor or another case manager would be coordinating his follow up care in pt's local area. Pt and his daughter notified of the above and verbalized an understanding.    For [place] this afternoon.    Haze Boyden RN [facility] BSN ONC

## 2021-10-29 NOTE — Plan of Care (Addendum)
NURSE NOTE SUMMARY  [facility] [address]   Patient Name: Tyler Rollins   Attending Physician: Arville Care, MD   Today's date:   10/29/2021 LOS: 2 days   Shift Summary:                                                              Assumed care of patient. Assessment completed.  Patient walked around the room wearing tslo brace  Pain was controlled with oral and iv medications  Dressing to lower back in tact with old drainage  IV intact  Pt is voiding WNL in the urinal  Neuro-vascular assessment is intact.  Vital  signs reviewed:    Temp: 97.7 F (36.5 C), Heart Rate: 63, Resp Rate: 15, BP: 149/88    Report given to accepting RN     End of shift.        Provider Notifications:        Rapid Response Notifications:  Mobility:      PMP Activity: Step 6 - Walks in Room (10/28/2021  7:45 PM)     Weight tracking:  Family Dynamic:   No data found.          Last Bowel Movement   Last BM Date: 10/26/21        Problem: Moderate/High Fall Risk Score >5  Goal: Patient will remain free of falls  Outcome: Progressing  Flowsheets (Taken 10/28/2021 1945)  VH Moderate Risk (6-13):   ALL REQUIRED LOW INTERVENTIONS   INITIATE YELLOW "FALL RISK" SIGNAGE   YELLOW NON-SKID SLIPPERS   YELLOW "FALL RISK" ARM BAND   PLACE FALL RISK LEVEL ON WHITE BOARD FOR COMMUNICATION PURPOSES IN PATIENT'S ROOM     Problem: Droplet Isolation  Goal: Prevent transmission of Influenza(FLU) while caring for patient in isolation  Outcome: Progressing  Flowsheets  Taken 10/29/2021 0307 by Tiffany Kocher, RN  Is appropriate isolation sign and PPE outside Patient Room?: Yes  Strict Hand Hygiene: Yes  Donning and doffing PPE properly both Staff and Visitors: Yes  Multi-dose Medications stored in plastic bags?: Yes  Taken 10/28/2021 0005 by Estella Husk, RN  Provided Patient and family education: Verbal education     Problem: Compromised Tissue integrity  Goal: Damaged tissue is healing and protected  Outcome: Progressing  Flowsheets  (Taken 10/27/2021 0938 by Thana Ates, RN)  Damaged tissue is healing and protected:   Monitor/assess Braden scale every shift   Reposition patient every 2 hours and as needed unless able to reposition self   Increase activity as tolerated/progressive mobility   Keep intact skin clean and dry   Monitor patient's hygiene practices  Goal: Nutritional status is improving  Outcome: Progressing  Flowsheets (Taken 10/26/2021 0344 by Weldon Picking, RN)  Nutritional status is improving: Allow adequate time for meals     Problem: Pain interferes with ability to perform ADL  Goal: Pain at adequate level as identified by patient  Outcome: Progressing  Flowsheets (Taken 10/26/2021 0922 by Edman Circle, RN)  Pain at adequate level as identified by patient:   Identify patient comfort function goal   Assess for risk of opioid induced respiratory depression, including snoring/sleep apnea. Alert healthcare team of risk factors identified.   Assess pain on admission,  during daily assessment and/or before any "as needed" intervention(s)   Reassess pain within 30-60 minutes of any procedure/intervention, per Pain Assessment, Intervention, Reassessment (AIR) Cycle   Evaluate if patient comfort function goal is met   Evaluate patient's satisfaction with pain management progress   Offer non-pharmacological pain management interventions   Consult/collaborate with Physical Therapy, Occupational Therapy, and/or Speech Therapy   Include patient/patient care companion in decisions related to pain management as needed     Problem: Side Effects from Pain Analgesia  Goal: Patient will experience minimal side effects of analgesic therapy  Outcome: Progressing  Flowsheets (Taken 10/26/2021 1930 by Weldon Picking, RN)  Patient will experience minimal side effects of analgesic therapy:   Monitor/assess patient's respiratory status (RR depth, effort, breath sounds)   Assess for changes in cognitive function
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
## 2021-10-29 NOTE — Discharge Instr - AVS First Page (Addendum)
Someone from [employer] should be contacting you about follow up with doctors in your area.        Wear brace when out of bed  Take your medications as directed.  Call your primary care doctor for a follow up in 1 week  Follow all instructions as given by the doctors and Physical/Occupational therapy  Call with any and all concerns or symptoms not normal for you

## 2021-10-30 LAB — VH CULTURE-BLOOD-VENIPUNCTURE
Culture Result: NO GROWTH
Culture Result: NO GROWTH

## 2021-11-02 LAB — VH SURGICAL PATHOLOGY

## 2021-11-04 DIAGNOSIS — E213 Hyperparathyroidism, unspecified: Secondary | ICD-10-CM | POA: Diagnosis not present

## 2021-11-04 DIAGNOSIS — E559 Vitamin D deficiency, unspecified: Secondary | ICD-10-CM | POA: Diagnosis not present

## 2021-11-10 ENCOUNTER — Ambulatory Visit: Payer: BC Managed Care – PPO | Admitting: Anesthesiology

## 2021-11-10 ENCOUNTER — Ambulatory Visit
Admission: RE | Admit: 2021-11-10 | Discharge: 2021-11-10 | Disposition: A | Discharge status: Home / Self Care | Payer: BC Managed Care – PPO | Attending: Gastroenterology | Admitting: Gastroenterology

## 2021-11-10 ENCOUNTER — Encounter: Payer: Self-pay | Admitting: Gastroenterology

## 2021-11-10 ENCOUNTER — Encounter
Admission: RE | Disposition: A | Discharge status: Home / Self Care | Payer: Self-pay | Source: Home / Self Care | Attending: Gastroenterology

## 2021-11-10 DIAGNOSIS — D123 Benign neoplasm of transverse colon: Secondary | ICD-10-CM | POA: Diagnosis not present

## 2021-11-10 DIAGNOSIS — K648 Other hemorrhoids: Secondary | ICD-10-CM | POA: Diagnosis not present

## 2021-11-10 DIAGNOSIS — Z1211 Encounter for screening for malignant neoplasm of colon: Secondary | ICD-10-CM | POA: Diagnosis not present

## 2021-11-10 DIAGNOSIS — K635 Polyp of colon: Secondary | ICD-10-CM | POA: Diagnosis not present

## 2021-11-10 DIAGNOSIS — Z86010 Personal history of colonic polyps: Secondary | ICD-10-CM | POA: Diagnosis not present

## 2021-11-10 DIAGNOSIS — Z800 Family history of malignant neoplasm of digestive organs: Secondary | ICD-10-CM | POA: Diagnosis not present

## 2021-11-10 HISTORY — PX: COLONOSCOPY WITH PROPOFOL: SHX5780

## 2021-11-10 SURGERY — COLONOSCOPY WITH PROPOFOL
Anesthesia: General

## 2021-11-10 MED ORDER — PROPOFOL 500 MG/50ML IV EMUL
INTRAVENOUS | Status: AC
  Filled 2021-11-10: qty 50

## 2021-11-10 MED ORDER — PROPOFOL 10 MG/ML IV BOLUS
INTRAVENOUS | Status: AC
  Filled 2021-11-10: qty 40

## 2021-11-10 MED ORDER — LIDOCAINE HCL (CARDIAC) PF 100 MG/5ML IV SOSY
PREFILLED_SYRINGE | INTRAVENOUS | Status: DC | PRN
  Administered 2021-11-10: 50 mg via INTRAVENOUS

## 2021-11-10 MED ORDER — LIDOCAINE HCL (PF) 2 % IJ SOLN
INTRAMUSCULAR | Status: AC
  Filled 2021-11-10: qty 5

## 2021-11-10 MED ORDER — PROPOFOL 500 MG/50ML IV EMUL
INTRAVENOUS | Status: DC | PRN
Start: 2021-11-10 — End: 2021-11-10
  Administered 2021-11-10: 200 ug/kg/min via INTRAVENOUS

## 2021-11-10 MED ORDER — SODIUM CHLORIDE 0.9 % IV SOLN
INTRAVENOUS | Status: DC
  Administered 2021-11-10: 1000 mL via INTRAVENOUS

## 2021-11-10 MED ORDER — PROPOFOL 10 MG/ML IV BOLUS
INTRAVENOUS | Status: DC | PRN
  Administered 2021-11-10: 100 mg via INTRAVENOUS

## 2021-11-10 NOTE — Anesthesia Postprocedure Evaluation (Signed)
Anesthesia Post Note  Patient: Harold Smith  Procedure(s) Performed: COLONOSCOPY WITH PROPOFOL  Patient location during evaluation: Endoscopy Anesthesia Type: General Level of consciousness: awake and alert Pain management: pain level controlled Vital Signs Assessment: post-procedure vital signs reviewed and stable Respiratory status: spontaneous breathing, nonlabored ventilation, respiratory function stable and patient connected to nasal cannula oxygen Cardiovascular status: blood pressure returned to baseline and stable Postop Assessment: no apparent nausea or vomiting Anesthetic complications: no   No notable events documented.   Last Vitals:  Vitals:   11/10/21 0817 11/10/21 0827  BP: 97/74 127/89  Pulse: (!) 59 65  Resp: 18 15  Temp:    SpO2: 97% 100%    Last Pain:  Vitals:   11/10/21 0807  TempSrc: Temporal  PainSc: Asleep                 Margaree Mackintosh

## 2021-11-10 NOTE — Op Note (Signed)
[facility] Gastroenterology Patient Name: Harold Smith Procedure Date: 11/10/2021 7:43 AM MRN: [id number] Account #: [id number] Date of Birth: 11-29-1966 Admit Type: Outpatient Age: 55 Room: [facility] ENDO ROOM 3 Gender: Male Note Status: Finalized Instrument Name: Park Meo [id number] Procedure:             Colonoscopy Indications:           Surveillance: History of numerous (> 10) adenomas on                         last colonoscopy (< 3 yrs), Last colonoscopy: October                         2021 Providers:             Lin Landsman MD, MD Referring MD:          [name] (Referring MD) Medicines:             General Anesthesia Complications:         No immediate complications. Estimated blood loss: None. Procedure:             Pre-Anesthesia Assessment:                        - Prior to the procedure, a History and Physical was                         performed, and patient medications and allergies were                         reviewed. The patient is competent. The risks and                         benefits of the procedure and the sedation options and                         risks were discussed with the patient. All questions                         were answered and informed consent was obtained.                         Patient identification and proposed procedure were                         verified by the physician, the nurse, the                         anesthesiologist, the anesthetist and the technician                         in the pre-procedure area in the procedure room in the                         endoscopy suite. Mental Status Examination: alert and                         oriented. Airway Examination: normal oropharyngeal  airway and neck mobility. Respiratory Examination:                         clear to auscultation. CV Examination: normal.                         Prophylactic Antibiotics: The patient does  not require                         prophylactic antibiotics. Prior Anticoagulants: The                         patient has taken no previous anticoagulant or                         antiplatelet agents. ASA Grade Assessment: II - A                         patient with mild systemic disease. After reviewing                         the risks and benefits, the patient was deemed in                         satisfactory condition to undergo the procedure. The                         anesthesia plan was to use general anesthesia.                         Immediately prior to administration of medications,                         the patient was re-assessed for adequacy to receive                         sedatives. The heart rate, respiratory rate, oxygen                         saturations, blood pressure, adequacy of pulmonary                         ventilation, and response to care were monitored                         throughout the procedure. The physical status of the                         patient was re-assessed after the procedure.                        After obtaining informed consent, the colonoscope was                         passed under direct vision. Throughout the procedure,                         the patient's blood pressure, pulse, and oxygen  saturations were monitored continuously. The                         Colonoscope was introduced through the anus and                         advanced to the the cecum, identified by appendiceal                         orifice and ileocecal valve. The colonoscopy was                         performed with moderate difficulty due to inadequate                         bowel prep and significant looping. Successful                         completion of the procedure was aided by applying                         abdominal pressure. The patient tolerated the                         procedure well. The quality of the  bowel preparation                         was poor. Findings:      The perianal and digital rectal examinations were normal. Pertinent       negatives include normal sphincter tone and no palpable rectal lesions.      Two sessile polyps were found in the proximal transverse colon. The       polyps were 4 to 5 mm in size. These polyps were removed with a cold       snare. Resection and retrieval were complete. Estimated blood loss: none.      Copious quantities of semi-liquid stool was found in the entire colon,       precluding visualization.      Non-bleeding internal hemorrhoids were found during retroflexion. The       hemorrhoids were medium-sized. Impression:            - Preparation of the colon was poor.                        - Two 4 to 5 mm polyps in the proximal transverse                         colon, removed with a cold snare. Resected and                         retrieved.                        - Stool in the entire examined colon.                        - Non-bleeding internal hemorrhoids. Recommendation:        - Discharge patient to home (with escort).                        -  Resume previous diet today.                        - Continue present medications.                        - Await pathology results.                        - Repeat colonoscopy in 6 months with 2 day prep                         because the bowel preparation was poor.                        - Return to my office at appointment to be scheduled                         for hemorrhoid banding. Procedure Code(s):     --- Professional ---                        [phone], Colonoscopy, flexible; with removal of                         tumor(s), polyp(s), or other lesion(s) by snare                         technique Diagnosis Code(s):     --- Professional ---                        Z86.010, Personal history of colonic polyps                        K64.8, Other hemorrhoids                        K63.5,  Polyp of colon CPT copyright 2019 American Medical Association. All rights reserved. The codes documented in this report are preliminary and upon coder review may  be revised to meet current compliance requirements. Dr. Ulyess Mort Lin Landsman MD, MD 11/10/2021 8:06:21 AM This report has been signed electronically. Number of Addenda: 0 Note Initiated On: 11/10/2021 7:43 AM Scope Withdrawal Time: 0 hours 10 minutes 1 second  Total Procedure Duration: 0 hours 14 minutes 39 seconds  Estimated Blood Loss:  Estimated blood loss: none.      [facility]

## 2021-11-10 NOTE — Anesthesia Preprocedure Evaluation (Signed)
Anesthesia Evaluation  Patient identified by MRN, date of birth, ID band Patient awake    Reviewed: Allergy & Precautions, NPO status , Patient's Chart, lab work & pertinent test results  Airway Mallampati: III  TM Distance: >3 FB Neck ROM: full    Dental   Pulmonary neg pulmonary ROS,    Pulmonary exam normal        Cardiovascular negative cardio ROS Normal cardiovascular exam     Neuro/Psych negative neurological ROS  negative psych ROS   GI/Hepatic negative GI ROS, Neg liver ROS,   Endo/Other  negative endocrine ROS  Renal/GU negative Renal ROS  negative genitourinary   Musculoskeletal   Abdominal Normal abdominal exam  (+)   Peds  Hematology negative hematology ROS (+)   Anesthesia Other Findings Past Medical History: No date: Allergic rhinitis due to pollen No date: Colon polyps No date: Diverticulitis large intestine No date: Obesity No date: Osteoarthritis, knee No date: Sleep apnea     Comment:  does not use cpap No date: Sleep disturbance  Past Surgical History: 2008: ACHILLES TENDON REPAIR; Right 07/20/2021: CATARACT EXTRACTION W/PHACO; Left     Comment:  Procedure: CATARACT EXTRACTION PHACO AND INTRAOCULAR               LENS PLACEMENT (IOC) LEFT 2.11 00:24.0;  Surgeon:               Birder Robson, MD;  Location: [place];                Service: Ophthalmology;  Laterality: Left;  sleep apnea 08/03/2021: CATARACT EXTRACTION W/PHACO; Right     Comment:  Procedure: CATARACT EXTRACTION PHACO AND INTRAOCULAR               LENS PLACEMENT (IOC) RIGHT;  Surgeon: Birder Robson,               MD;  Location: [place];  Service:               Ophthalmology;  Laterality: Right;  3.19 0:33.0 2006: COLON RESECTION; Left     Comment:  due to diverticular disease at [facility] 2013?: COLONOSCOPY 05/22/2018: COLONOSCOPY WITH PROPOFOL; N/A     Comment:  Procedure: COLONOSCOPY WITH PROPOFOL;   Surgeon: Lin Landsman, MD;  Location: [facility] ENDOSCOPY;  Service:               Gastroenterology;  Laterality: N/A; 10/22/2020: COLONOSCOPY WITH PROPOFOL; N/A     Comment:  Procedure: COLONOSCOPY WITH PROPOFOL;  Surgeon: Lin Landsman, MD;  Location: [place];                Service: Endoscopy;  Laterality: N/A;  priority 4 06/10/2019: DRUG INDUCED ENDOSCOPY; N/A     Comment:  Procedure: DRUG INDUCED SLEEP ENDOSCOPY;  Surgeon:               Jerrell Belfast, MD;  Location: [place];  Service: ENT;  Laterality: N/A; 2009: GASTRIC BYPASS; N/A 10/15: HIATAL HERNIA REPAIR     Comment:  at [place] 10/22/2020: POLYPECTOMY     Comment:  Procedure: POLYPECTOMY;  Surgeon: Lin Landsman,  MD;  Location: [place];  Service: Endoscopy;; 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     Comment:  revision     Reproductive/Obstetrics negative OB ROS                             Anesthesia Physical Anesthesia Plan  ASA: 2  Anesthesia Plan: General   Post-op Pain Management:    Induction: Intravenous  PONV Risk Score and Plan: Propofol infusion and TIVA  Airway Management Planned: Natural Airway and Nasal Cannula  Additional Equipment:   Intra-op Plan:   Post-operative Plan:   Informed Consent: I have reviewed the patients History and Physical, chart, labs and discussed the procedure including the risks, benefits and alternatives for the proposed anesthesia with the patient or authorized representative who has indicated his/her understanding and acceptance.     Dental Advisory Given  Plan Discussed with: Anesthesiologist, CRNA and Surgeon  Anesthesia Plan Comments: (Patient consented for risks of anesthesia including but not limited to:  - adverse reactions to medications - risk of airway placement if required - damage to eyes, teeth, lips or other oral  mucosa - nerve damage due to positioning  - sore throat or hoarseness - Damage to heart, brain, nerves, lungs, other parts of body or loss of life  Patient voiced understanding.)        Anesthesia Quick Evaluation

## 2021-11-10 NOTE — H&P (Signed)
[name], MD [address]  [place]  [place], [place] [address]  Main: [phone]  Fax: [phone] Pager: [phone]  Primary Care Physician:  [name], MD Primary Gastroenterologist:  Dr. [name]  Pre-Procedure History & Physical: HPI:  Harold Smith is a 55 y.o. male is here for an colonoscopy.   Past Medical History:  Diagnosis Date   Allergic rhinitis due to pollen    Colon polyps    Diverticulitis large intestine    Obesity    Osteoarthritis, knee    Sleep apnea    does not use cpap   Sleep disturbance     Past Surgical History:  Procedure Laterality Date   ACHILLES TENDON REPAIR Right 12/26/2006   CATARACT EXTRACTION W/PHACO Left 07/20/2021   Procedure: CATARACT EXTRACTION PHACO AND INTRAOCULAR LENS PLACEMENT ([place]) LEFT 2.11 00:24.0;  Surgeon: Birder Robson, MD;  Location: [place];  Service: Ophthalmology;  Laterality: Left;  sleep apnea   CATARACT EXTRACTION W/PHACO Right 08/03/2021   Procedure: CATARACT EXTRACTION PHACO AND INTRAOCULAR LENS PLACEMENT ([facility]) RIGHT;  Surgeon: Birder Robson, MD;  Location: [place];  Service: Ophthalmology;  Laterality: Right;  3.19 0:33.0   CHOLECYSTECTOMY     COLON RESECTION Left 12/26/2004   due to diverticular disease at [facility]  2013?   COLONOSCOPY WITH PROPOFOL N/A 05/22/2018   Procedure: COLONOSCOPY WITH PROPOFOL;  Surgeon: Lin Landsman, MD;  Location: [facility] ENDOSCOPY;  Service: Gastroenterology;  Laterality: N/A;   COLONOSCOPY WITH PROPOFOL N/A 10/22/2020   Procedure: COLONOSCOPY WITH PROPOFOL;  Surgeon: Lin Landsman, MD;  Location: [place];  Service: Endoscopy;  Laterality: N/A;  priority 4   DRUG INDUCED ENDOSCOPY N/A 06/10/2019   Procedure: DRUG INDUCED SLEEP ENDOSCOPY;  Surgeon: Jerrell Belfast, MD;  Location: [place];  Service: ENT;  Laterality: N/A;   EYE SURGERY     GASTRIC BYPASS N/A 12/27/2007    HIATAL HERNIA REPAIR  09/25/2014   at [place]   POLYPECTOMY  10/22/2020   Procedure: POLYPECTOMY;  Surgeon: Lin Landsman, MD;  Location: [place];  Service: Endoscopy;;   ROTATOR CUFF REPAIR     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 Authorizing Provider  Cyanocobalamin (VITAMIN B-12 PO) Take by mouth.   Yes [provider]  Ferrous Sulfate (IRON PO) Take by mouth.   Yes [provider]  hydrocortisone 2.5 % cream Apply topically 3 (three) times daily as needed. 07/02/21  Yes [name], MD  Multiple Vitamin (MULTIVITAMIN) tablet Take 1 tablet by mouth daily.   Yes [provider]  zolpidem (AMBIEN) 10 MG tablet TAKE ONE TABLET (10 MG) BY MOUTH AT BEDTIME AS NEEDED FOR SLEEP 07/02/21   [name], MD    Allergies as of 10/19/2021   (No Known Allergies)    Family History  Problem Relation Age of Onset   Hypertension Mother    Diabetes Mother    Kidney disease Mother    Heart disease Mother    Colon cancer Father 73   Asthma Sister    Obesity Sister    Stroke Sister    Obesity Brother    Diabetes Other    Heart disease Other    Hypertension Other    Kidney disease Other    Cancer Paternal Uncle  unk type, possible prostate   Lung cancer Paternal Grandmother     Social History   Socioeconomic History   Marital status: Single    Spouse name: Not on file   Number of children: 3   Years of education: Not on file   Highest education level: Not on file  Occupational History   Occupation: Chiropractor truck driver  Tobacco Use   Smoking status: Never   Smokeless tobacco: Never   Tobacco comments:    occasional cigar  Vaping Use   Vaping Use: Never used  Substance and Sexual Activity   Alcohol use: Yes    Alcohol/week: 0.0 standard drinks    Comment: occasional   Drug use: No   Sexual activity: Yes  Other Topics  Concern   Not on file  Social History Narrative   Lives with 2 daughters from 77st marriage   Shared custody of another daughter from 2nd marriage   Social Determinants of Health   Financial Resource Strain: Not on file  Food Insecurity: Not on file  Transportation Needs: Not on file  Physical Activity: Not on file  Stress: Not on file  Social Connections: Not on file  Intimate Partner Violence: Not on file    Review of Systems: See HPI, otherwise negative ROS  Physical Exam: BP (!) 163/100   Pulse 70   Temp (!) 96.6 F (35.9 C) (Temporal)   Resp 18   Ht 6' (1.829 m)   Wt 81.8 kg   SpO2 100%   BMI 24.46 kg/m  General:   Alert,  pleasant and cooperative in NAD Head:  Normocephalic and atraumatic. Neck:  Supple; no masses or thyromegaly. Lungs:  Clear throughout to auscultation.    Heart:  Regular rate and rhythm. Abdomen:  Soft, nontender and nondistended. Normal bowel sounds, without guarding, and without rebound.   Neurologic:  Alert and  oriented x4;  grossly normal neurologically.  Impression/Plan: Harold Smith is here for an colonoscopy to be performed for h/o colon polyps  Risks, benefits, limitations, and alternatives regarding  colonoscopy have been reviewed with the patient.  Questions have been answered.  All parties agreeable.   [name], MD  11/10/2021, 7:39 AM

## 2021-11-10 NOTE — Transfer of Care (Signed)
Immediate Anesthesia Transfer of Care Note  Patient: Harold Smith  Procedure(s) Performed: COLONOSCOPY WITH PROPOFOL  Patient Location: PACU  Anesthesia Type:General  Level of Consciousness: sedated  Airway & Oxygen Therapy: Patient Spontanous Breathing  Post-op Assessment: Report given to RN and Post -op Vital signs reviewed and stable  Post vital signs: Reviewed and stable  Last Vitals:  Vitals Value Taken Time  BP 88/64 11/10/21 0810  Temp 35.6 C 11/10/21 0807  Pulse 63 11/10/21 0811  Resp 18 11/10/21 0811  SpO2 97 % 11/10/21 0811  Vitals shown include unvalidated device data.  Last Pain:  Vitals:   11/10/21 0807  TempSrc: Temporal  PainSc: Asleep      Patients Stated Pain Goal: 0 (65/80/06 3494)  Complications: No notable events documented.

## 2021-11-11 ENCOUNTER — Encounter: Payer: Self-pay | Admitting: Gastroenterology

## 2021-11-11 LAB — SURGICAL PATHOLOGY

## 2022-02-07 DIAGNOSIS — M5416 Radiculopathy, lumbar region: Secondary | ICD-10-CM | POA: Diagnosis not present

## 2022-02-09 DIAGNOSIS — N390 Urinary tract infection, site not specified: Secondary | ICD-10-CM | POA: Diagnosis not present

## 2022-02-09 DIAGNOSIS — A499 Bacterial infection, unspecified: Secondary | ICD-10-CM | POA: Diagnosis not present

## 2022-02-09 DIAGNOSIS — D509 Iron deficiency anemia, unspecified: Secondary | ICD-10-CM | POA: Diagnosis not present

## 2022-02-09 DIAGNOSIS — R293 Abnormal posture: Secondary | ICD-10-CM | POA: Diagnosis not present

## 2022-02-21 ENCOUNTER — Encounter: Payer: Self-pay | Admitting: Internal Medicine
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
## 2022-02-21 NOTE — Telephone Encounter (Signed)
Called pt back and made appt with Dr Silvio Pate for 02-25-22. He will need a referral to a new back specialist.

## 2022-02-25 ENCOUNTER — Other Ambulatory Visit: Payer: Self-pay

## 2022-02-25 ENCOUNTER — Ambulatory Visit (INDEPENDENT_AMBULATORY_CARE_PROVIDER_SITE_OTHER): Payer: 59 | Admitting: Internal Medicine

## 2022-02-25 ENCOUNTER — Encounter: Payer: Self-pay | Admitting: Internal Medicine

## 2022-02-25 VITALS — BP 132/86 | HR 107 | Temp 97.8°F | Ht 70.0 in | Wt 188.0 lb

## 2022-02-25 DIAGNOSIS — M4306 Spondylolysis, lumbar region: Secondary | ICD-10-CM

## 2022-02-25 MED ORDER — GABAPENTIN 300 MG PO CAPS: 300.0000 mg | ORAL_CAPSULE | Freq: Every day | ORAL | 3 refills | Status: DC

## 2022-02-25 NOTE — Patient Instructions (Signed)
Please try the gabapentin---300mg  at bedtime. If you still aren't sleeping well after a few nights, but no side effects, increase to 600mg . Let me know if that doesn't help either. ?
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
## 2022-02-25 NOTE — Assessment & Plan Note (Signed)
Needs new neurosurgeon evaluation (insurance covers [place] or [place]) ?Will try bedtime gabapentin 300mg ---and increase to 600mg  if needed (and more depending on how he does) ?Consider duloxetine ?

## 2022-03-03 ENCOUNTER — Encounter: Payer: Self-pay | Admitting: Internal Medicine

## 2022-03-09 ENCOUNTER — Encounter: Payer: Self-pay | Admitting: Internal Medicine

## 2022-03-09 ENCOUNTER — Other Ambulatory Visit: Payer: Self-pay

## 2022-03-09 ENCOUNTER — Ambulatory Visit (INDEPENDENT_AMBULATORY_CARE_PROVIDER_SITE_OTHER): Payer: 59 | Admitting: Internal Medicine

## 2022-03-09 DIAGNOSIS — M4306 Spondylolysis, lumbar region: Secondary | ICD-10-CM

## 2022-03-09 MED ORDER — GABAPENTIN 300 MG PO CAPS
300.0000 mg | ORAL_CAPSULE | Freq: Two times a day (BID) | ORAL | 1 refills | Status: DC
Start: 2022-03-09 — End: 2022-09-07

## 2022-03-09 MED ORDER — DULOXETINE HCL 30 MG PO CPEP: 30.0000 mg | ORAL_CAPSULE | Freq: Every day | ORAL | 3 refills | Status: DC

## 2022-03-09 NOTE — Assessment & Plan Note (Signed)
Is going to Dr Wendall Papa probably need to proceed with surgery ?Will try increasing the gabapentin to '900mg'$  at bedtime ?Add duloxetine '30mg'$  daily as well ?Recheck 1 month---discussed potential side effects ?
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
## 2022-03-18 ENCOUNTER — Encounter: Payer: Self-pay | Admitting: Internal Medicine

## 2022-03-18 DIAGNOSIS — M47816 Spondylosis without myelopathy or radiculopathy, lumbar region: Secondary | ICD-10-CM

## 2022-03-29 ENCOUNTER — Ambulatory Visit: Payer: 59 | Admitting: Orthopaedic Surgery

## 2022-03-31 ENCOUNTER — Ambulatory Visit: Payer: Self-pay

## 2022-03-31 ENCOUNTER — Encounter: Payer: Self-pay | Admitting: Specialist

## 2022-03-31 ENCOUNTER — Ambulatory Visit: Payer: 59 | Admitting: Specialist

## 2022-03-31 VITALS — BP 148/89 | HR 82 | Ht 71.0 in | Wt 185.0 lb

## 2022-03-31 DIAGNOSIS — S32010S Wedge compression fracture of first lumbar vertebra, sequela: Secondary | ICD-10-CM | POA: Diagnosis not present

## 2022-03-31 DIAGNOSIS — Z9889 Other specified postprocedural states: Secondary | ICD-10-CM

## 2022-03-31 DIAGNOSIS — M4310 Spondylolisthesis, site unspecified: Secondary | ICD-10-CM

## 2022-03-31 DIAGNOSIS — M4317 Spondylolisthesis, lumbosacral region: Secondary | ICD-10-CM | POA: Diagnosis not present

## 2022-03-31 DIAGNOSIS — M4306 Spondylolysis, lumbar region: Secondary | ICD-10-CM

## 2022-03-31 MED ORDER — HYDROCODONE-ACETAMINOPHEN 10-325 MG PO TABS: ORAL_TABLET | ORAL | 0 refills | Status: DC
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
## 2022-03-31 NOTE — Patient Instructions (Addendum)
Avoid bending, stooping and avoid lifting weights greater than 10 lbs. ?Avoid prolong standing and walking. ?Order for a new walker with wheels. ?Surgery scheduling secretary Kandice Hams, will call you in the next week to schedule for surgery.  ?Surgery recommended is a one level lumbar fusion L5-S1  this would be done with rods, screws and cage with local bone graft and  no allograft (donor bone graft). ?Take hydrocodone for for pain. ?Risk of surgery includes risk of infection 1 in 300 patients, bleeding extremely low  chance you would need a transfusion and I believe the risk is so low that there is no plan to give you a transfusion.   Risk to the nerves is one in 10,000. ?You will need to use a brace for 3 months and wean from the brace on the 4th month. ?Expect improved walking and standing tolerance. Expect relief of leg pain but numbness ?may persist depending on the length and degree of pressure that has been present. ?  ?

## 2022-04-12 ENCOUNTER — Ambulatory Visit (INDEPENDENT_AMBULATORY_CARE_PROVIDER_SITE_OTHER): Payer: 59 | Admitting: Internal Medicine

## 2022-04-12 ENCOUNTER — Encounter: Payer: Self-pay | Admitting: Internal Medicine

## 2022-04-12 DIAGNOSIS — M4306 Spondylolysis, lumbar region: Secondary | ICD-10-CM | POA: Diagnosis not present

## 2022-04-12 MED ORDER — DULOXETINE HCL 60 MG PO CPEP: 60.0000 mg | ORAL_CAPSULE | Freq: Every day | ORAL | 3 refills | Status: DC

## 2022-04-12 NOTE — Patient Instructions (Signed)
Please increase the duloxetine to '60mg'$  daily. ?Cut back the gabapentin to '1200mg'$  at bedtime. ? ?For sleep, continue to get up at 7AM every morning. Don't go to sleep till 2-3AM for now. When you are sleeping through till 7AM, you can slowly make your bedtime earlier (like 20-30 minutes every few days) ?

## 2022-04-12 NOTE — Progress Notes (Signed)
? ?Subjective:  ? ? Patient ID: Harold Smith, male    DOB: 1966/07/05, 56 y.o.   MRN: [id number] ? ?HPI ?Here for follow up of disabling back pain ? ?Did see Dr Louanne Skye 2 weeks ago ?Is planning on surgery--waiting for a date ? ?Still trouble sleeping ?Has tried '2000mg'$  of gabapentin and trazodone 100 (from mom) ?Still hasn't slept more that a couple of hours--awakens for an hour, then falls back to sleep, etc (just in spurts) ? ?No apparent problems with duloxetine  ?Mood is better "I am just trying to keep cool and pray" ? ?Ongoing constant pain ?Worsens with movement or cough ?Not much relief with the hydrocodone ? ?Current Outpatient Medications on File Prior to Visit  ?Medication Sig Dispense Refill  ? Cyanocobalamin (VITAMIN B-12 PO) Take by mouth.    ? cyclobenzaprine (FLEXERIL) 10 MG tablet Take 10-20 mg by mouth at bedtime.    ? DULoxetine (CYMBALTA) 30 MG capsule Take 1 capsule (30 mg total) by mouth daily. 30 capsule 3  ? Ferrous Sulfate (IRON PO) Take by mouth.    ? gabapentin (NEURONTIN) 300 MG capsule Take 1 capsule (300 mg total) by mouth 2 (two) times daily. Plus 4 at bedtime 180 capsule 1  ? HYDROcodone-acetaminophen (NORCO) 10-325 MG tablet Take one half tablet every 4-6 prn pain 30 tablet 0  ? HYDROcodone-acetaminophen (NORCO/VICODIN) 5-325 MG tablet Take 1-2 tablets by mouth at bedtime as needed.    ? Multiple Vitamin (MULTIVITAMIN) tablet Take 1 tablet by mouth daily.    ? ?No current facility-administered medications on file prior to visit.  ? ? ?No Known Allergies ? ?Past Medical History:  ?Diagnosis Date  ? Allergic rhinitis due to pollen   ? Colon polyps   ? Diverticulitis large intestine   ? Obesity   ? Osteoarthritis, knee   ? Sleep apnea   ? does not use cpap  ? Sleep disturbance   ? ? ?Past Surgical History:  ?Procedure Laterality Date  ? ACHILLES TENDON REPAIR Right 12/26/2006  ? CATARACT EXTRACTION W/PHACO Left 07/20/2021  ? Procedure: CATARACT EXTRACTION PHACO AND INTRAOCULAR LENS  PLACEMENT ([facility]) LEFT 2.11 00:24.0;  Surgeon: Birder Robson, MD;  Location: [facility];  Service: Ophthalmology;  Laterality: Left;  sleep apnea  ? CATARACT EXTRACTION W/PHACO Right 08/03/2021  ? Procedure: CATARACT EXTRACTION PHACO AND INTRAOCULAR LENS PLACEMENT ([facility]) RIGHT;  Surgeon: Birder Robson, MD;  Location: [facility];  Service: Ophthalmology;  Laterality: Right;  3.19 ?0:33.0  ? CHOLECYSTECTOMY    ? COLON RESECTION Left 12/26/2004  ? due to diverticular disease at [facility]  ? COLONOSCOPY  2013?  ? COLONOSCOPY WITH PROPOFOL N/A 05/22/2018  ? Procedure: COLONOSCOPY WITH PROPOFOL;  Surgeon: Lin Landsman, MD;  Location: [facility] ENDOSCOPY;  Service: Gastroenterology;  Laterality: N/A;  ? COLONOSCOPY WITH PROPOFOL N/A 10/22/2020  ? Procedure: COLONOSCOPY WITH PROPOFOL;  Surgeon: Lin Landsman, MD;  Location: [facility];  Service: Endoscopy;  Laterality: N/A;  priority 4  ? COLONOSCOPY WITH PROPOFOL N/A 11/10/2021  ? Procedure: COLONOSCOPY WITH PROPOFOL;  Surgeon: Lin Landsman, MD;  Location: [facility] ENDOSCOPY;  Service: Gastroenterology;  Laterality: N/A;  ? DRUG INDUCED ENDOSCOPY N/A 06/10/2019  ? Procedure: DRUG INDUCED SLEEP ENDOSCOPY;  Surgeon: Jerrell Belfast, MD;  Location: [facility];  Service: ENT;  Laterality: N/A;  ? EYE SURGERY    ? GASTRIC BYPASS N/A 12/27/2007  ? HIATAL HERNIA REPAIR  09/25/2014  ? at [facility]  ? POLYPECTOMY  10/22/2020  ? Procedure: POLYPECTOMY;  Surgeon: Lin Landsman, MD;  Location: [facility];  Service: Endoscopy;;  ? ROTATOR CUFF REPAIR    ? 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 revision  ? ? ?Family History  ?Problem Relation Age of Onset  ? Hypertension Mother   ? Diabetes Mother   ? Kidney disease Mother   ? Heart disease Mother   ? Colon cancer Father 62  ? Asthma Sister   ? Obesity Sister   ? Stroke Sister   ? Obesity Brother   ? Diabetes Other   ? Heart disease Other   ? Hypertension Other   ? Kidney  disease Other   ? Cancer Paternal Uncle   ?     unk type, possible prostate  ? Lung cancer Paternal Grandmother   ? ? ?Social History  ? ?Socioeconomic History  ? Marital status: Single  ?  Spouse name: Not on file  ? Number of children: 3  ? Years of education: Not on file  ? Highest education level: Not on file  ?Occupational History  ? Occupation: Production designer, theatre/television/film  ?Tobacco Use  ? Smoking status: Never  ? Smokeless tobacco: Never  ? Tobacco comments:  ?  occasional cigar  ?Vaping Use  ? Vaping Use: Never used  ?Substance and Sexual Activity  ? Alcohol use: Yes  ?  Alcohol/week: 0.0 standard drinks  ?  Comment: occasional  ? Drug use: No  ? Sexual activity: Yes  ?Other Topics Concern  ? Not on file  ?Social History Narrative  ? Lives with 2 daughters from 1st marriage  ? Shared custody of another daughter from 2nd marriage  ? ?Social Determinants of Health  ? ?Financial Resource Strain: Not on file  ?Food Insecurity: Not on file  ?Transportation Needs: Not on file  ?Physical Activity: Not on file  ?Stress: Not on file  ?Social Connections: Not on file  ?Intimate Partner Violence: Not on file  ? ?Review of Systems ?Eating okay ?Weight is stable ? ?   ?Objective:  ? Physical Exam ?Constitutional:   ?   Appearance: Normal appearance.  ?Neurological:  ?   Mental Status: He is alert.  ?Psychiatric:     ?   Mood and Affect: Mood normal.     ?   Behavior: Behavior normal.  ?  ? ? ? ? ?   ?Assessment & Plan:  ? ?

## 2022-04-12 NOTE — Assessment & Plan Note (Signed)
Ongoing pain and disability ?Awaiting surgery by Dr Louanne Skye ?Ongoing constant pain--limited help by meds ?Sleep is a big issue ? ?Will have him cut back the gabapentin to '1200mg'$  at bedtime ?Will increase duloxetine to '60mg'$  ?Discussed sleep hygiene===sleep contraction explained ?

## 2022-04-18 DIAGNOSIS — R202 Paresthesia of skin: Secondary | ICD-10-CM | POA: Insufficient documentation

## 2022-04-18 DIAGNOSIS — R2689 Other abnormalities of gait and mobility: Secondary | ICD-10-CM | POA: Insufficient documentation

## 2022-04-18 DIAGNOSIS — G479 Sleep disorder, unspecified: Secondary | ICD-10-CM | POA: Insufficient documentation

## 2022-04-21 ENCOUNTER — Other Ambulatory Visit: Payer: Self-pay

## 2022-04-22 ENCOUNTER — Telehealth: Payer: Self-pay | Admitting: Specialist

## 2022-04-22 NOTE — Telephone Encounter (Signed)
Pt submitted forms for [name]  to fill out for pt diagnoses. No payment needed ?

## 2022-04-27 NOTE — Telephone Encounter (Signed)
Faxed papers back to [phone] ?

## 2022-04-28 ENCOUNTER — Ambulatory Visit: Payer: 59 | Admitting: Specialist

## 2022-04-28 ENCOUNTER — Encounter: Payer: Self-pay | Admitting: Specialist

## 2022-04-28 VITALS — BP 148/102 | HR 118 | Ht 72.0 in | Wt 186.9 lb

## 2022-04-28 DIAGNOSIS — M4310 Spondylolisthesis, site unspecified: Secondary | ICD-10-CM

## 2022-04-28 DIAGNOSIS — M4317 Spondylolisthesis, lumbosacral region: Secondary | ICD-10-CM | POA: Diagnosis not present

## 2022-04-28 DIAGNOSIS — Z9889 Other specified postprocedural states: Secondary | ICD-10-CM

## 2022-04-28 DIAGNOSIS — M4306 Spondylolysis, lumbar region: Secondary | ICD-10-CM | POA: Diagnosis not present

## 2022-04-28 DIAGNOSIS — S32010S Wedge compression fracture of first lumbar vertebra, sequela: Secondary | ICD-10-CM | POA: Diagnosis not present

## 2022-04-28 MED ORDER — ZOLPIDEM TARTRATE ER 12.5 MG PO TBCR: 12.5000 mg | EXTENDED_RELEASE_TABLET | Freq: Every evening | ORAL | 0 refills | Status: DC | PRN
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
## 2022-05-02 NOTE — Pre-Procedure Instructions (Signed)
Surgical Instructions ? ? ? Your procedure is scheduled on Monday, May 15th. ? Report to [facility] Main Entrance "A" at 05:30 A.M., then check in with the Admitting office. ? Call this number if you have problems the morning of surgery: ? [phone] ? ? If you have any questions prior to your surgery date call [phone]: Open Monday-Friday 8am-4pm ? ? ? Remember: ? Do not eat after midnight the night before your surgery ? ?You may drink clear liquids until 04:30 AM the morning of your surgery.   ?Clear liquids allowed are: Water, Non-Citrus Juices (without pulp), Carbonated Beverages, Clear Tea, Black Coffee Only (NO MILK, CREAM OR POWDERED CREAMER of any kind), and Gatorade. ?  ? Take these medicines the morning of surgery with A SIP OF WATER  ?cetirizine (ZYRTEC)  ?DULoxetine (CYMBALTA)  ?fluticasone (FLONASE) ? ? ?If needed: ?HYDROcodone-acetaminophen ([place]) ?methocarbamol (ROBAXIN) ? ?As of today, STOP taking any Aspirin (unless otherwise instructed by your surgeon) Aleve, Naproxen, Ibuprofen, Motrin, Advil, Goody's, BC's, all herbal medications, fish oil, and all vitamins. This includes your meloxicam [facility])  ?         ?           ?Do NOT Smoke (Tobacco/Vaping) for 24 hours prior to your procedure. ? ?If you use a CPAP at night, you may bring your mask/headgear for your overnight stay. ?  ?Contacts, glasses, piercing's, hearing aid's, dentures or partials may not be worn into surgery, please bring cases for these belongings.  ?  ?For patients admitted to the hospital, discharge time will be determined by your treatment team. ?  ?Patients discharged the day of surgery will not be allowed to drive home, and someone needs to stay with them for 24 hours. ? ?SURGICAL WAITING ROOM VISITATION ?Patients having surgery or a procedure may have two support people in the waiting room. These visitors may be switched out with other visitors if needed. ?Children under the age of 37 must have an adult accompany them  who is not the patient. ?If the patient needs to stay at the hospital during part of their recovery, the visitor guidelines for inpatient rooms apply. ? ?Please refer to the [facility] website for the visitor guidelines for Inpatients (after your surgery is over and you are in a regular room).  ? ? ?Special instructions:   ?[facility]- Preparing For Surgery ? ?Before surgery, you can play an important role. Because skin is not sterile, your skin needs to be as free of germs as possible. You can reduce the number of germs on your skin by washing with CHG (chlorahexidine gluconate) Soap before surgery.  CHG is an antiseptic cleaner which kills germs and bonds with the skin to continue killing germs even after washing.   ? ?Oral Hygiene is also important to reduce your risk of infection.  Remember - BRUSH YOUR TEETH THE MORNING OF SURGERY WITH YOUR REGULAR TOOTHPASTE ? ?Please do not use if you have an allergy to CHG or antibacterial soaps. If your skin becomes reddened/irritated stop using the CHG.  ?Do not shave (including legs and underarms) for at least 48 hours prior to first CHG shower. It is OK to shave your face. ? ?Please follow these instructions carefully. ?  ?Shower the NIGHT BEFORE SURGERY and the MORNING OF SURGERY ? ?If you chose to wash your hair, wash your hair first as usual with your normal shampoo. ? ?After you shampoo, rinse your hair and body thoroughly to remove the shampoo. ? ?  Use CHG Soap as you would any other liquid soap. You can apply CHG directly to the skin and wash gently with a scrungie or a clean washcloth.  ? ?Apply the CHG Soap to your body ONLY FROM THE NECK DOWN.  Do not use on open wounds or open sores. Avoid contact with your eyes, ears, mouth and genitals (private parts). Wash Face and genitals (private parts)  with your normal soap.  ? ?Wash thoroughly, paying special attention to the area where your surgery will be performed. ? ?Thoroughly rinse your body with warm water from  the neck down. ? ?DO NOT shower/wash with your normal soap after using and rinsing off the CHG Soap. ? ?Pat yourself dry with a CLEAN TOWEL. ? ?Wear CLEAN PAJAMAS to bed the night before surgery ? ?Place CLEAN SHEETS on your bed the night before your surgery ? ?DO NOT SLEEP WITH PETS. ? ? ?Day of Surgery: ?Take a shower with CHG soap. ?Do not wear jewelry  ?Do not wear lotions, powders, colognes, or deodorant. ?Do not shave 48 hours prior to surgery.  Men may shave face and neck. ?Do not bring valuables to the hospital.  ?[facility] is not responsible for any belongings or valuables. ? ?Wear Clean/Comfortable clothing the morning of surgery ?Remember to brush your teeth WITH YOUR REGULAR TOOTHPASTE. ?  ?Please read over the following fact sheets that you were given. ? ? ? ?If you received a COVID test during your pre-op visit  it is requested that you wear a mask when out in public, stay away from anyone that may not be feeling well and notify your surgeon if you develop symptoms. If you have been in contact with anyone that has tested positive in the last 10 days please notify you surgeon.  ?

## 2022-05-03 ENCOUNTER — Encounter: Payer: Self-pay | Admitting: Internal Medicine

## 2022-05-03 ENCOUNTER — Telehealth: Payer: Self-pay | Admitting: Specialist

## 2022-05-03 ENCOUNTER — Encounter (HOSPITAL_COMMUNITY): Payer: Self-pay

## 2022-05-03 ENCOUNTER — Encounter (HOSPITAL_COMMUNITY)
Admission: RE | Admit: 2022-05-03 | Discharge: 2022-05-03 | Disposition: A | Discharge status: Home / Self Care | Payer: 59 | Source: Ambulatory Visit | Attending: Specialist | Admitting: Specialist

## 2022-05-03 ENCOUNTER — Other Ambulatory Visit: Payer: Self-pay

## 2022-05-03 VITALS — BP 157/95 | HR 70 | Temp 97.9°F | Resp 17 | Ht 72.0 in | Wt 187.0 lb

## 2022-05-03 DIAGNOSIS — Z01812 Encounter for preprocedural laboratory examination: Secondary | ICD-10-CM | POA: Diagnosis not present

## 2022-05-03 DIAGNOSIS — Z01818 Encounter for other preprocedural examination: Secondary | ICD-10-CM

## 2022-05-03 HISTORY — DX: Anxiety disorder, unspecified: F41.9

## 2022-05-03 LAB — CBC
HCT: 26.1 % — ABNORMAL LOW (ref 39.0–52.0)
Hemoglobin: 8.3 g/dL — ABNORMAL LOW (ref 13.0–17.0)
MCH: 23.9 pg — ABNORMAL LOW (ref 26.0–34.0)
MCHC: 31.8 g/dL (ref 30.0–36.0)
MCV: 75 fL — ABNORMAL LOW (ref 80.0–100.0)
Platelets: 258 10*3/uL (ref 150–400)
RBC: 3.48 MIL/uL — ABNORMAL LOW (ref 4.22–5.81)
RDW: 18.6 % — ABNORMAL HIGH (ref 11.5–15.5)
WBC: 8.5 10*3/uL (ref 4.0–10.5)
nRBC: 0 % (ref 0.0–0.2)

## 2022-05-03 LAB — SURGICAL PCR SCREEN
MRSA, PCR: NEGATIVE
Staphylococcus aureus: POSITIVE — AB

## 2022-05-03 LAB — NO BLOOD PRODUCTS

## 2022-05-03 NOTE — Telephone Encounter (Signed)
Called patient left message advised paperwork is at the front desk for pickup ?

## 2022-05-03 NOTE — Progress Notes (Signed)
PCP - Evelena Peat ?Cardiologist - denies ? ?PPM/ICD - denies ?Device Orders -  ?Rep Notified -  ? ?Chest x-ray - na ?EKG -na  ?Stress Test - 08/26/14 ?ECHO - denies ?Cardiac Cath - denies ? ?Sleep Study -yes;05/21/2019 ?CPAP - doesn't use CPAP ? ?Fasting Blood Sugar - na ?Checks Blood Sugar _____ times a day ? ?Blood Thinner Instructions:na ?Aspirin Instructions:na ? ?ERAS Protcol -clear liquids until 0430 ?PRE-SURGERY Ensure or G2- no ? ?COVID TEST- na ? ? ?Anesthesia review: no ? ?Patient denies shortness of breath, fever, cough and chest pain at PAT appointment ? ? ?All instructions explained to the patient, with a verbal understanding of the material. Patient agrees to go over the instructions while at home for a better understanding. Patient also instructed to wear a mask when out in public prior to surgery. . The opportunity to ask questions was provided. ?  ?

## 2022-05-04 ENCOUNTER — Ambulatory Visit (INDEPENDENT_AMBULATORY_CARE_PROVIDER_SITE_OTHER): Payer: 59 | Admitting: Internal Medicine

## 2022-05-04 ENCOUNTER — Ambulatory Visit: Payer: 59 | Admitting: Surgery

## 2022-05-04 ENCOUNTER — Encounter: Payer: Self-pay | Admitting: Internal Medicine

## 2022-05-04 VITALS — BP 130/84 | HR 68 | Temp 97.7°F | Ht 72.0 in | Wt 190.0 lb

## 2022-05-04 DIAGNOSIS — D649 Anemia, unspecified: Secondary | ICD-10-CM

## 2022-05-04 DIAGNOSIS — D509 Iron deficiency anemia, unspecified: Secondary | ICD-10-CM | POA: Insufficient documentation

## 2022-05-04 NOTE — Telephone Encounter (Signed)
Seen in office today  

## 2022-05-04 NOTE — Progress Notes (Signed)
? ?Subjective:  ? ? Patient ID: Harold Smith, male    DOB: 1966-06-23, 56 y.o.   MRN: [id number] ? ?HPI ?Here due to anemia--which has caused his surgery to be postponed ? ?Eating okay ?Weight is stable ?Takes iron every day ? ?No abnormal bleeding ?Had colonoscopy in November--only 2 small polyps at that point ? ?Current Outpatient Medications on File Prior to Visit  ?Medication Sig Dispense Refill  ? cetirizine (ZYRTEC) 10 MG tablet Take 10 mg by mouth daily.    ? Cyanocobalamin (VITAMIN B-12 PO) Take 1 tablet by mouth daily.    ? cyclobenzaprine (FLEXERIL) 10 MG tablet Take 10 mg by mouth at bedtime.    ? DULoxetine (CYMBALTA) 60 MG capsule Take 1 capsule (60 mg total) by mouth daily. 90 capsule 3  ? FEROSUL 325 (65 Fe) MG tablet Take 325 mg by mouth every morning.    ? fluticasone (FLONASE) 50 MCG/ACT nasal spray Place 2 sprays into both nostrils daily.    ? gabapentin (NEURONTIN) 300 MG capsule Take 1 capsule (300 mg total) by mouth 2 (two) times daily. Plus 4 at bedtime (Patient taking differently: Take 1,200 mg by mouth at bedtime.) 180 capsule 1  ? HYDROcodone-acetaminophen (NORCO) 10-325 MG tablet Take one half tablet every 4-6 prn pain (Patient taking differently: Take 1 tablet by mouth. Every 4-6 hours as needed for pain) 30 tablet 0  ? meloxicam (MOBIC) 15 MG tablet Take 15 mg by mouth daily as needed for pain.    ? methocarbamol (ROBAXIN) 500 MG tablet Take 500 mg by mouth every 6 (six) hours as needed.    ? Multiple Vitamin (MULTIVITAMIN) tablet Take 1 tablet by mouth daily.    ? QUEtiapine (SEROQUEL) 25 MG tablet Take 75 mg by mouth at bedtime.    ? traZODone (DESYREL) 50 MG tablet Take 100 mg by mouth at bedtime.    ? zolpidem (AMBIEN CR) 12.5 MG CR tablet Take 1 tablet (12.5 mg total) by mouth at bedtime as needed for sleep. 30 tablet 0  ? ?No current facility-administered medications on file prior to visit.  ? ? ?No Known Allergies ? ?Past Medical History:  ?Diagnosis Date  ? Allergic rhinitis  due to pollen   ? Anxiety   ? Colon polyps   ? Diverticulitis large intestine   ? Obesity   ? Osteoarthritis, knee   ? Sleep apnea   ? does not use cpap  ? Sleep disturbance   ? ? ?Past Surgical History:  ?Procedure Laterality Date  ? ACHILLES TENDON REPAIR Right 12/26/2006  ? CATARACT EXTRACTION W/PHACO Left 07/20/2021  ? Procedure: CATARACT EXTRACTION PHACO AND INTRAOCULAR LENS PLACEMENT ([facility]) LEFT 2.11 00:24.0;  Surgeon: Birder Robson, MD;  Location: [place];  Service: Ophthalmology;  Laterality: Left;  sleep apnea  ? CATARACT EXTRACTION W/PHACO Right 08/03/2021  ? Procedure: CATARACT EXTRACTION PHACO AND INTRAOCULAR LENS PLACEMENT ([place]) RIGHT;  Surgeon: Birder Robson, MD;  Location: [place];  Service: Ophthalmology;  Laterality: Right;  3.19 ?0:33.0  ? CHOLECYSTECTOMY    ? COLON RESECTION Left 12/26/2004  ? due to diverticular disease at [facility]  ? COLONOSCOPY  2013?  ? COLONOSCOPY WITH PROPOFOL N/A 05/22/2018  ? Procedure: COLONOSCOPY WITH PROPOFOL;  Surgeon: Lin Landsman, MD;  [facility] ENDOSCOPY;  Service: Gastroenterology;  Laterality: N/A;  ? COLONOSCOPY WITH PROPOFOL N/A 10/22/2020  ? Procedure: COLONOSCOPY WITH PROPOFOL;  Surgeon: Lin Landsman, MD;  Location: [place];  Service: Endoscopy;  Laterality: N/A;  priority 4  ? COLONOSCOPY WITH PROPOFOL N/A 11/10/2021  ? Procedure: COLONOSCOPY WITH PROPOFOL;  Surgeon: Lin Landsman, MD;  Location: [facility] ENDOSCOPY;  Service: Gastroenterology;  Laterality: N/A;  ? DRUG INDUCED ENDOSCOPY N/A 06/10/2019  ? Procedure: DRUG INDUCED SLEEP ENDOSCOPY;  Surgeon: Jerrell Belfast, MD;  Location: [place];  Service: ENT;  Laterality: N/A;  ? EYE SURGERY    ? GASTRIC BYPASS N/A 12/27/2007  ? HIATAL HERNIA REPAIR  09/25/2014  ? at [facility]  ? POLYPECTOMY  10/22/2020  ? Procedure: POLYPECTOMY;  Surgeon: Lin Landsman, MD;  Location: [place];  Service: Endoscopy;;  ? ROTATOR CUFF  REPAIR    ? 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 revision  ? ? ?Family History  ?Problem Relation Age of Onset  ? Hypertension Mother   ? Diabetes Mother   ? Kidney disease Mother   ? Heart disease Mother   ? Colon cancer Father 75  ? Asthma Sister   ? Obesity Sister   ? Stroke Sister   ? Obesity Brother   ? Diabetes Other   ? Heart disease Other   ? Hypertension Other   ? Kidney disease Other   ? Cancer Paternal Uncle   ?     unk type, possible prostate  ? Lung cancer Paternal Grandmother   ? ? ?Social History  ? ?Socioeconomic History  ? Marital status: Single  ?  Spouse name: Not on file  ? Number of children: 3  ? Years of education: Not on file  ? Highest education level: Not on file  ?Occupational History  ? Occupation: Production designer, theatre/television/film  ?Tobacco Use  ? Smoking status: Never  ?  Passive exposure: Past  ? Smokeless tobacco: Never  ? Tobacco comments:  ?  occasional cigar  ?Vaping Use  ? Vaping Use: Never used  ?Substance and Sexual Activity  ? Alcohol use: Yes  ?  Alcohol/week: 0.0 standard drinks  ?  Comment: occasional  ? Drug use: No  ? Sexual activity: Yes  ?Other Topics Concern  ? Not on file  ?Social History Narrative  ? Lives with 2 daughters from 1st marriage  ? Shared custody of another daughter from 2nd marriage  ? ?Social Determinants of Health  ? ?Financial Resource Strain: Not on file  ?Food Insecurity: Not on file  ?Transportation Needs: Not on file  ?Physical Activity: Not on file  ?Stress: Not on file  ?Social Connections: Not on file  ?Intimate Partner Violence: Not on file  ? ?Review of Systems ? ?   ?Objective:  ? Physical Exam ?Constitutional:   ?   Appearance: Normal appearance.  ?Neurological:  ?   Mental Status: He is alert.  ?Psychiatric:     ?   Mood and Affect: Mood normal.     ?   Behavior: Behavior normal.  ?  ? ? ? ? ?   ?Assessment & Plan:  ? ?
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
## 2022-05-09 ENCOUNTER — Ambulatory Visit: Admit: 2022-05-09 | Payer: 59 | Admitting: Specialist

## 2022-05-09 SURGERY — POSTERIOR LUMBAR FUSION 1 LEVEL
Anesthesia: General

## 2022-05-10 ENCOUNTER — Encounter: Payer: Self-pay | Admitting: Oncology

## 2022-05-10 ENCOUNTER — Inpatient Hospital Stay: Payer: 59

## 2022-05-10 ENCOUNTER — Inpatient Hospital Stay: Payer: 59 | Attending: Oncology | Admitting: Oncology

## 2022-05-10 VITALS — BP 137/97 | HR 93 | Temp 98.7°F | Resp 20 | Ht 72.0 in | Wt 186.6 lb

## 2022-05-10 DIAGNOSIS — D508 Other iron deficiency anemias: Secondary | ICD-10-CM | POA: Insufficient documentation

## 2022-05-10 DIAGNOSIS — Z79899 Other long term (current) drug therapy: Secondary | ICD-10-CM | POA: Insufficient documentation

## 2022-05-10 DIAGNOSIS — Z801 Family history of malignant neoplasm of trachea, bronchus and lung: Secondary | ICD-10-CM | POA: Insufficient documentation

## 2022-05-10 DIAGNOSIS — Z800 Family history of malignant neoplasm of digestive organs: Secondary | ICD-10-CM | POA: Diagnosis not present

## 2022-05-10 DIAGNOSIS — D509 Iron deficiency anemia, unspecified: Secondary | ICD-10-CM

## 2022-05-10 DIAGNOSIS — Z809 Family history of malignant neoplasm, unspecified: Secondary | ICD-10-CM | POA: Insufficient documentation

## 2022-05-10 LAB — CBC WITH DIFFERENTIAL/PLATELET
Abs Immature Granulocytes: 0.05 10*3/uL (ref 0.00–0.07)
Basophils Absolute: 0.1 10*3/uL (ref 0.0–0.1)
Basophils Relative: 1 %
Eosinophils Absolute: 0.1 10*3/uL (ref 0.0–0.5)
Eosinophils Relative: 1 %
HCT: 27.7 % — ABNORMAL LOW (ref 39.0–52.0)
Hemoglobin: 8.4 g/dL — ABNORMAL LOW (ref 13.0–17.0)
Immature Granulocytes: 1 %
Lymphocytes Relative: 13 %
Lymphs Abs: 1.4 10*3/uL (ref 0.7–4.0)
MCH: 23.2 pg — ABNORMAL LOW (ref 26.0–34.0)
MCHC: 30.3 g/dL (ref 30.0–36.0)
MCV: 76.5 fL — ABNORMAL LOW (ref 80.0–100.0)
Monocytes Absolute: 0.8 10*3/uL (ref 0.1–1.0)
Monocytes Relative: 7 %
Neutro Abs: 8.6 10*3/uL — ABNORMAL HIGH (ref 1.7–7.7)
Neutrophils Relative %: 77 %
Platelets: 278 10*3/uL (ref 150–400)
RBC: 3.62 MIL/uL — ABNORMAL LOW (ref 4.22–5.81)
RDW: 20.2 % — ABNORMAL HIGH (ref 11.5–15.5)
WBC: 11 10*3/uL — ABNORMAL HIGH (ref 4.0–10.5)
nRBC: 0 % (ref 0.0–0.2)

## 2022-05-10 LAB — IRON AND TIBC
Iron: 22 ug/dL — ABNORMAL LOW (ref 45–182)
Saturation Ratios: 4 % — ABNORMAL LOW (ref 17.9–39.5)
TIBC: 508 ug/dL — ABNORMAL HIGH (ref 250–450)
UIBC: 486 ug/dL

## 2022-05-10 LAB — RETIC PANEL
Immature Retic Fract: 29.2 % — ABNORMAL HIGH (ref 2.3–15.9)
RBC.: 3.6 MIL/uL — ABNORMAL LOW (ref 4.22–5.81)
Retic Count, Absolute: 54.7 10*3/uL (ref 19.0–186.0)
Retic Ct Pct: 1.5 % (ref 0.4–3.1)
Reticulocyte Hemoglobin: 24.9 pg — ABNORMAL LOW (ref >27.9)

## 2022-05-10 LAB — FERRITIN: Ferritin: 2 ng/mL — ABNORMAL LOW (ref 24–336)

## 2022-05-10 LAB — COMPREHENSIVE METABOLIC PANEL
ALT: 23 U/L (ref 0–44)
AST: 19 U/L (ref 15–41)
Albumin: 3.8 g/dL (ref 3.5–5.0)
Alkaline Phosphatase: 92 U/L (ref 38–126)
Anion gap: 7 (ref 5–15)
BUN: 19 mg/dL (ref 6–20)
CO2: 23 mmol/L (ref 22–32)
Calcium: 8.5 mg/dL — ABNORMAL LOW (ref 8.9–10.3)
Chloride: 108 mmol/L (ref 98–111)
Creatinine, Ser: 0.84 mg/dL (ref 0.61–1.24)
GFR, Estimated: >60 mL/min (ref >60)
Glucose, Bld: 96 mg/dL (ref 70–99)
Potassium: 4.2 mmol/L (ref 3.5–5.1)
Sodium: 138 mmol/L (ref 135–145)
Total Bilirubin: 0.6 mg/dL (ref 0.3–1.2)
Total Protein: 7.3 g/dL (ref 6.5–8.1)

## 2022-05-10 LAB — TECHNOLOGIST SMEAR REVIEW: Plt Morphology: ADEQUATE

## 2022-05-10 NOTE — Progress Notes (Signed)
?Hematology/Oncology Consult note ?[phone] [phone] ?  ? ?   ? ? ?Patient Care Team: ?[name], MD as PCP - General (Internal Medicine) ?[name], MD as Referring Physician (Family Medicine) ?Christene Lye, MD as Consulting Physician (General Surgery) ? ?REFERRING PROVIDER: ?[name], MD  ?CHIEF COMPLAINTS/REASON FOR VISIT:  ?Evaluation of anemia ? ?HISTORY OF PRESENTING ILLNESS:  ? ?Harold Smith is a  56 y.o.  male with PMH listed below was seen in consultation at the request of  [name], MD  for evaluation of anemia ? ?05/03/2022, patient had preop blood work done showed hemoglobin of 8.3, hematocrit 75.  Platelet count 258, white count 8.5.  Patient had plan of back surgery which was aborted due to decreased hemoglobin. ?Reviewed his previous lab records ?01/30/2022, hemoglobin 10, MCV 88.6, ?07/02/2021, hemoglobin 11, MCV 90.2. ?Patient denies any black or bloody stool.  Denies any unintentional weight loss, night sweats, fever, epigastric discomfort. ?Patient takes Mobic as needed for pain. ?11/10/2021, colonoscopy showed 2 small polyps in the proximal transverse colon.  Pathology showed tubular adenoma. ?Family history is positive for father who passed away from colorectal cancer at age of 76. ? ?Review of Systems  ?Constitutional:  Positive for fatigue. Negative for appetite change, chills, fever and unexpected weight change.  ?HENT:   Negative for hearing loss and voice change.   ?Eyes:  Negative for eye problems and icterus.  ?Respiratory:  Negative for chest tightness, cough and shortness of breath.   ?Cardiovascular:  Negative for chest pain and leg swelling.  ?Gastrointestinal:  Negative for abdominal distention and abdominal pain.  ?Endocrine: Negative for hot flashes.  ?Genitourinary:  Negative for difficulty urinating, dysuria and frequency.   ?Musculoskeletal:  Positive for back pain. Negative for arthralgias.  ?Skin:  Negative  for itching and rash.  ?Neurological:  Negative for light-headedness and numbness.  ?Hematological:  Negative for adenopathy. Does not bruise/bleed easily.  ?Psychiatric/Behavioral:  Negative for confusion.   ? ?MEDICAL HISTORY:  ?Past Medical History:  ?Diagnosis Date  ? Allergic rhinitis due to pollen   ? Anxiety   ? Colon polyps   ? Diverticulitis large intestine   ? Obesity   ? Osteoarthritis, knee   ? Sleep apnea   ? does not use cpap  ? Sleep disturbance   ? ? ?SURGICAL HISTORY: ?Past Surgical History:  ?Procedure Laterality Date  ? ACHILLES TENDON REPAIR Right 12/26/2006  ? CATARACT EXTRACTION W/PHACO Left 07/20/2021  ? Procedure: CATARACT EXTRACTION PHACO AND INTRAOCULAR LENS PLACEMENT (I[facility]) LEFT 2.11 00:24.0;  Surgeon: Birder Robson, MD;  Location: [place];  Service: Ophthalmology;  Laterality: Left;  sleep apnea  ? CATARACT EXTRACTION W/PHACO Right 08/03/2021  ? Procedure: CATARACT EXTRACTION PHACO AND INTRAOCULAR LENS PLACEMENT ([place]) RIGHT;  Surgeon: Birder Robson, MD;  Location: [place];  Service: Ophthalmology;  Laterality: Right;  3.19 ?0:33.0  ? CHOLECYSTECTOMY    ? COLON RESECTION Left 12/26/2004  ? due to diverticular disease at [facility]  ? COLONOSCOPY  2013?  ? COLONOSCOPY WITH PROPOFOL N/A 05/22/2018  ? Procedure: COLONOSCOPY WITH PROPOFOL;  Surgeon: Lin Landsman, MD;  Location: [facility] ENDOSCOPY;  Service: Gastroenterology;  Laterality: N/A;  ? COLONOSCOPY WITH PROPOFOL N/A 10/22/2020  ? Procedure: COLONOSCOPY WITH PROPOFOL;  Surgeon: Lin Landsman, MD;  Location: [place];  Service: Endoscopy;  Laterality: N/A;  priority 4  ? COLONOSCOPY WITH PROPOFOL N/A 11/10/2021  ? Procedure: COLONOSCOPY WITH PROPOFOL;  Surgeon: Marius Ditch,  Tally Due, MD;  Location: [place];  Service: Gastroenterology;  Laterality: N/A;  ? DRUG INDUCED ENDOSCOPY N/A 06/10/2019  ? Procedure: DRUG INDUCED SLEEP ENDOSCOPY;  Surgeon: Jerrell Belfast, MD;  Location: [place];  Service: ENT;  Laterality: N/A;  ? EYE SURGERY    ? GASTRIC BYPASS N/A 12/27/2007  ? HIATAL HERNIA REPAIR  09/25/2014  ? at [facility]  ? POLYPECTOMY  10/22/2020  ? Procedure: POLYPECTOMY;  Surgeon: Lin Landsman, MD;  Location: [place];  Service: Endoscopy;;  ? ROTATOR CUFF REPAIR    ? 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 revision  ? ? ?SOCIAL HISTORY: ?Social History  ? ?Socioeconomic History  ? Marital status: Single  ?  Spouse name: Not on file  ? Number of children: 3  ? Years of education: Not on file  ? Highest education level: Not on file  ?Occupational History  ? Occupation: Production designer, theatre/television/film  ?Tobacco Use  ? Smoking status: Never  ?  Passive exposure: Past  ? Smokeless tobacco: Never  ? Tobacco comments:  ?  occasional cigar  ?Vaping Use  ? Vaping Use: Never used  ?Substance and Sexual Activity  ? Alcohol use: Yes  ?  Alcohol/week: 0.0 standard drinks  ?  Comment: occasional  ? Drug use: No  ? Sexual activity: Yes  ?Other Topics Concern  ? Not on file  ?Social History Narrative  ? Lives with 2 daughters from 1st marriage  ? Shared custody of another daughter from 2nd marriage  ? ?Social Determinants of Health  ? ?Financial Resource Strain: Not on file  ?Food Insecurity: Not on file  ?Transportation Needs: Not on file  ?Physical Activity: Not on file  ?Stress: Not on file  ?Social Connections: Not on file  ?Intimate Partner Violence: Not on file  ? ? ?FAMILY HISTORY: ?Family History  ?Problem Relation Age of Onset  ? Hypertension Mother   ? Diabetes Mother   ? Kidney disease Mother   ? Heart disease Mother   ? Colon cancer Father 20  ? Asthma Sister   ? Obesity Sister   ? Stroke Sister   ? Obesity Brother   ? Diabetes Other   ? Heart disease Other   ? Hypertension Other   ? Kidney disease Other   ? Cancer Paternal Uncle   ?     unk type, possible prostate  ? Lung cancer Paternal Grandmother   ? ? ?ALLERGIES:  has No Known Allergies. ? ?MEDICATIONS:  ?Current  Outpatient Medications  ?Medication Sig Dispense Refill  ? cetirizine (ZYRTEC) 10 MG tablet Take 10 mg by mouth daily.    ? Cyanocobalamin (VITAMIN B-12 PO) Take 1 tablet by mouth daily.    ? cyclobenzaprine (FLEXERIL) 10 MG tablet Take 10 mg by mouth at bedtime.    ? DULoxetine (CYMBALTA) 60 MG capsule Take 1 capsule (60 mg total) by mouth daily. 90 capsule 3  ? FEROSUL 325 (65 Fe) MG tablet Take 325 mg by mouth every morning.    ? fluticasone (FLONASE) 50 MCG/ACT nasal spray Place 2 sprays into both nostrils daily.    ? gabapentin (NEURONTIN) 300 MG capsule Take 1 capsule (300 mg total) by mouth 2 (two) times daily. Plus 4 at bedtime (Patient taking differently: Take 1,200 mg by mouth at bedtime.) 180 capsule 1  ? HYDROcodone-acetaminophen (NORCO) 10-325 MG tablet Take one half tablet every 4-6 prn pain (Patient taking differently: Take 1 tablet by mouth. Every 4-6  hours as needed for pain) 30 tablet 0  ? meloxicam (MOBIC) 15 MG tablet Take 15 mg by mouth daily as needed for pain.    ? methocarbamol (ROBAXIN) 500 MG tablet Take 500 mg by mouth every 6 (six) hours as needed.    ? Multiple Vitamin (MULTIVITAMIN) tablet Take 1 tablet by mouth daily.    ? QUEtiapine (SEROQUEL) 25 MG tablet Take 75 mg by mouth at bedtime.    ? traZODone (DESYREL) 50 MG tablet Take 100 mg by mouth at bedtime.    ? zolpidem (AMBIEN CR) 12.5 MG CR tablet Take 1 tablet (12.5 mg total) by mouth at bedtime as needed for sleep. 30 tablet 0  ? ?No current facility-administered medications for this visit.  ? ? ? ?PHYSICAL EXAMINATION: ?ECOG PERFORMANCE STATUS: 1 - Symptomatic but completely ambulatory ?Vitals:  ? 05/10/22 1105  ?BP: (!) 137/97  ?Pulse: 93  ?Resp: 20  ?Temp: 98.7 ?F (37.1 ?C)  ?SpO2: 100%  ? ?Filed Weights  ? 05/10/22 1105  ?Weight: 186 lb 9.6 oz (84.6 kg)  ? ? ?Physical Exam ?Constitutional:   ?   Comments: Mild acute stress due to back pain  ?HENT:  ?   Head: Normocephalic and atraumatic.  ?Eyes:  ?   General: No scleral  icterus. ?Cardiovascular:  ?   Rate and Rhythm: Normal rate and regular rhythm.  ?   Heart sounds: Normal heart sounds.  ?Pulmonary:  ?   Effort: Pulmonary effort is normal. No respiratory distress.  ?   Breath so

## 2022-05-11 ENCOUNTER — Telehealth: Payer: Self-pay

## 2022-05-11 ENCOUNTER — Encounter: Payer: Self-pay | Admitting: Oncology

## 2022-05-11 NOTE — Telephone Encounter (Signed)
-----   Message from Earlie Server, MD sent at 05/10/2022  5:52 PM EDT ----- ?Please let patient know that iron level is extremely low.  I recommend Venofer weekly x5. ?Follow-up plan, labs in 3 months 2 to 3 days prior to MD +/- Venofer.  Labs are ordered.  Thank you ?

## 2022-05-11 NOTE — Telephone Encounter (Signed)
Pt informed of MD recommendation and follow up. Please inform pt of appts:  ? ?Venofer weekly x5.  First dose new ?Labs in 3 moths  ?MD/ venofer 2-3 days after labs  ? ?

## 2022-05-11 NOTE — Telephone Encounter (Signed)
-----   Message from Earlie Server, MD sent at 05/10/2022  5:53 PM EDT ----- ?Also please advise patient to make a follow-up appointment with Dr. Marius Ditch for iron deficiency anemia work-up. ?

## 2022-05-12 NOTE — Progress Notes (Signed)
Thanks,  Clair Gulling

## 2022-05-20 ENCOUNTER — Inpatient Hospital Stay: Payer: 59

## 2022-05-20 VITALS — BP 128/85 | HR 75 | Temp 98.0°F | Resp 18

## 2022-05-20 DIAGNOSIS — D508 Other iron deficiency anemias: Secondary | ICD-10-CM

## 2022-05-20 MED ORDER — IRON SUCROSE 20 MG/ML IV SOLN
200.0000 mg | Freq: Once | INTRAVENOUS | Status: AC
  Administered 2022-05-20: 200 mg via INTRAVENOUS
  Filled 2022-05-20: qty 10

## 2022-05-20 MED ORDER — SODIUM CHLORIDE 0.9 % IV SOLN: 200.0000 mg | Freq: Once | INTRAVENOUS | Status: DC

## 2022-05-20 MED ORDER — SODIUM CHLORIDE 0.9 % IV SOLN
Freq: Once | INTRAVENOUS | Status: AC
  Filled 2022-05-20: qty 250

## 2022-05-20 NOTE — Patient Instructions (Signed)

## 2022-05-24 ENCOUNTER — Other Ambulatory Visit: Payer: Self-pay | Admitting: Specialist

## 2022-05-25 ENCOUNTER — Other Ambulatory Visit: Payer: Self-pay | Admitting: Internal Medicine

## 2022-05-25 ENCOUNTER — Other Ambulatory Visit: Payer: Self-pay | Admitting: Specialist

## 2022-05-25 NOTE — Telephone Encounter (Signed)
Does not look like we have written this. Will forward to Dr Silvio Pate.

## 2022-05-26 ENCOUNTER — Inpatient Hospital Stay: Payer: 59 | Attending: Oncology

## 2022-05-26 VITALS — BP 148/98 | HR 72 | Temp 99.1°F | Resp 16

## 2022-05-26 DIAGNOSIS — D508 Other iron deficiency anemias: Secondary | ICD-10-CM | POA: Diagnosis present

## 2022-05-26 MED ORDER — SODIUM CHLORIDE 0.9 % IV SOLN
Freq: Once | INTRAVENOUS | Status: AC
  Filled 2022-05-26: qty 250

## 2022-05-26 MED ORDER — TRAZODONE HCL 50 MG PO TABS: 50.0000 mg | ORAL_TABLET | Freq: Every evening | ORAL | 0 refills | Status: DC | PRN

## 2022-05-26 MED ORDER — SODIUM CHLORIDE 0.9 % IV SOLN: 200.0000 mg | Freq: Once | INTRAVENOUS | Status: DC

## 2022-05-26 MED ORDER — IRON SUCROSE 20 MG/ML IV SOLN
200.0000 mg | Freq: Once | INTRAVENOUS | Status: AC
  Administered 2022-05-26: 200 mg via INTRAVENOUS

## 2022-05-27 ENCOUNTER — Inpatient Hospital Stay: Payer: 59

## 2022-05-27 MED ORDER — HYDROCODONE-ACETAMINOPHEN 10-325 MG PO TABS: 1.0000 | ORAL_TABLET | Freq: Two times a day (BID) | ORAL | 0 refills | Status: DC | PRN

## 2022-05-28 ENCOUNTER — Emergency Department
Admission: EM | Admit: 2022-05-28 | Discharge: 2022-05-28 | Disposition: A | Discharge status: Home / Self Care | Payer: 59 | Attending: Emergency Medicine | Admitting: Emergency Medicine

## 2022-05-28 ENCOUNTER — Encounter: Payer: Self-pay | Admitting: Medical Oncology

## 2022-05-28 DIAGNOSIS — I10 Essential (primary) hypertension: Secondary | ICD-10-CM | POA: Insufficient documentation

## 2022-05-28 DIAGNOSIS — N4833 Priapism, drug-induced: Secondary | ICD-10-CM | POA: Diagnosis not present

## 2022-05-28 DIAGNOSIS — D649 Anemia, unspecified: Secondary | ICD-10-CM | POA: Insufficient documentation

## 2022-05-28 DIAGNOSIS — N4830 Priapism, unspecified: Secondary | ICD-10-CM | POA: Diagnosis not present

## 2022-05-28 LAB — CBC WITH DIFFERENTIAL/PLATELET
Abs Immature Granulocytes: 0.03 10*3/uL (ref 0.00–0.07)
Basophils Absolute: 0 10*3/uL (ref 0.0–0.1)
Basophils Relative: 0 %
Eosinophils Absolute: 0.1 10*3/uL (ref 0.0–0.5)
Eosinophils Relative: 1 %
HCT: 26.1 % — ABNORMAL LOW (ref 39.0–52.0)
Hemoglobin: 7.9 g/dL — ABNORMAL LOW (ref 13.0–17.0)
Immature Granulocytes: 0 %
Lymphocytes Relative: 15 %
Lymphs Abs: 1.4 10*3/uL (ref 0.7–4.0)
MCH: 23.2 pg — ABNORMAL LOW (ref 26.0–34.0)
MCHC: 30.3 g/dL (ref 30.0–36.0)
MCV: 76.8 fL — ABNORMAL LOW (ref 80.0–100.0)
Monocytes Absolute: 0.7 10*3/uL (ref 0.1–1.0)
Monocytes Relative: 7 %
Neutro Abs: 7.1 10*3/uL (ref 1.7–7.7)
Neutrophils Relative %: 77 %
Platelets: 352 10*3/uL (ref 150–400)
RBC: 3.4 MIL/uL — ABNORMAL LOW (ref 4.22–5.81)
RDW: 21.8 % — ABNORMAL HIGH (ref 11.5–15.5)
Smear Review: NORMAL
WBC: 9.2 10*3/uL (ref 4.0–10.5)
nRBC: 0 % (ref 0.0–0.2)

## 2022-05-28 LAB — COMPREHENSIVE METABOLIC PANEL
ALT: 20 U/L (ref 0–44)
AST: 16 U/L (ref 15–41)
Albumin: 3.4 g/dL — ABNORMAL LOW (ref 3.5–5.0)
Alkaline Phosphatase: 83 U/L (ref 38–126)
Anion gap: 3 — ABNORMAL LOW (ref 5–15)
BUN: 13 mg/dL (ref 6–20)
CO2: 25 mmol/L (ref 22–32)
Calcium: 8.3 mg/dL — ABNORMAL LOW (ref 8.9–10.3)
Chloride: 111 mmol/L (ref 98–111)
Creatinine, Ser: 0.79 mg/dL (ref 0.61–1.24)
GFR, Estimated: >60 mL/min (ref >60)
Glucose, Bld: 87 mg/dL (ref 70–99)
Potassium: 3.2 mmol/L — ABNORMAL LOW (ref 3.5–5.1)
Sodium: 139 mmol/L (ref 135–145)
Total Bilirubin: 0.7 mg/dL (ref 0.3–1.2)
Total Protein: 6.9 g/dL (ref 6.5–8.1)

## 2022-05-28 LAB — TYPE AND SCREEN
ABO/RH(D): AB POS
Antibody Screen: NEGATIVE

## 2022-05-28 LAB — BLOOD GAS, VENOUS
Acid-base deficit: 5.7 mmol/L — ABNORMAL HIGH (ref 0.0–2.0)
Bicarbonate: 20.7 mmol/L (ref 20.0–28.0)
O2 Saturation: 73.5 %
Patient temperature: 37
pCO2, Ven: 44 mmHg (ref 44–60)
pH, Ven: 7.28 (ref 7.25–7.43)
pO2, Ven: 49 mmHg — ABNORMAL HIGH (ref 32–45)

## 2022-05-28 MED ORDER — OXYCODONE HCL 5 MG PO TABS: 5.0000 mg | ORAL_TABLET | Freq: Three times a day (TID) | ORAL | 0 refills | Status: AC | PRN

## 2022-05-28 MED ORDER — LIDOCAINE HCL 2 % IJ SOLN: 5.0000 mL | Freq: Once | INTRAMUSCULAR | Status: DC

## 2022-05-28 MED ORDER — LIDOCAINE HCL (PF) 1 % IJ SOLN: 5.0000 mL | Freq: Once | INTRAMUSCULAR | Status: AC

## 2022-05-28 MED ORDER — PHENYLEPHRINE 200 MCG/ML FOR PRIAPISM / HYPOTENSION
100.0000 ug | Freq: Once | INTRAMUSCULAR | Status: AC
  Administered 2022-05-28: 100 ug via INTRACAVERNOUS
  Filled 2022-05-28: qty 50

## 2022-05-28 MED ORDER — MORPHINE SULFATE (PF) 4 MG/ML IV SOLN
4.0000 mg | Freq: Once | INTRAVENOUS | Status: AC
  Administered 2022-05-28: 4 mg via INTRAVENOUS
  Filled 2022-05-28: qty 1

## 2022-05-28 MED ORDER — LIDOCAINE HCL (PF) 1 % IJ SOLN
INTRAMUSCULAR | Status: AC
  Administered 2022-05-28: 5 mL
  Filled 2022-05-28: qty 5

## 2022-05-28 NOTE — ED Provider Notes (Addendum)
[facility] Provider Note    Event Date/Time   First MD Initiated Contact with Patient 05/28/22 1707     (approximate)   History   Penis issue   HP  Harold Smith is a 56 y.o. male with past medical history of anemia, sleep disturbance anxiety who presents with priapism.  Patient has had a painful erection since 10 AM today.  He is on trazodone.  No history of similar.  Denies any trauma.  He is not on blood thinners.    Past Medical History:  Diagnosis Date   Allergic rhinitis due to pollen    Anxiety    Colon polyps    Diverticulitis large intestine    Obesity    Osteoarthritis, knee    Sleep apnea    does not use cpap   Sleep disturbance     Patient Active Problem List   Diagnosis Date Noted   Iron deficiency anemia 05/10/2022   Microcytic anemia 05/04/2022   Lumbar spondylolysis 02/25/2022   Sleep disturbance 07/02/2021   Elevated blood pressure reading 10/14/2019   Obstructive sleep apnea 05/28/2019   Preventative health care 06/23/2018   Personal hx of gastric bypass 06/23/2018   Erectile dysfunction 06/23/2018   History of colonic polyps    Rectal bleeding 12/22/2015   Diverticulosis large intestine w/o perforation or abscess w/bleeding    Colon polyps      Physical Exam  Triage Vital Signs: ED Triage Vitals [05/28/22 1704]  Enc Vitals Group     BP (!) 154/93     Pulse Rate 78     Resp 18     Temp 98.1 F (36.7 C)     Temp Source Oral     SpO2 98 %     Weight 180 lb (81.6 kg)     Height 6' (1.829 m)     Head Circumference      Peak Flow      Pain Score 6     Pain Loc      Pain Edu?      Excl. in [place]?     Most recent vital signs: Vitals:   05/28/22 1704 05/28/22 1930  BP: (!) 154/93 (!) 153/78  Pulse: 78 80  Resp: 18 13  Temp: 98.1 F (36.7 C)   SpO2: 98% 96%     General: Awake, appears uncomfortable CV:  Good peripheral perfusion.  Resp:  Normal effort.  Abd:  No distention.  Neuro:              Awake, Alert, Oriented x 3  Other:  Erect penis, to palpation   ED Results / Procedures / Treatments  Labs (all labs ordered are listed, but only abnormal results are displayed) Labs Reviewed  BLOOD GAS, VENOUS - Abnormal; Notable for the following components:      Result Value   pO2, Ven 49 (*)    Acid-base deficit 5.7 (*)    All other components within normal limits  COMPREHENSIVE METABOLIC PANEL - Abnormal; Notable for the following components:   Potassium 3.2 (*)    Calcium 8.3 (*)    Albumin 3.4 (*)    Anion gap 3 (*)    All other components within normal limits  CBC WITH DIFFERENTIAL/PLATELET - Abnormal; Notable for the following components:   RBC 3.40 (*)    Hemoglobin 7.9 (*)    HCT 26.1 (*)    MCV 76.8 (*)    MCH 23.2 (*)  RDW 21.8 (*)    All other components within normal limits  TYPE AND SCREEN     EKG     RADIOLOGY    PROCEDURES:  Critical Care performed: Yes, see critical care procedure note(s)  Irrigate corpus cavern, priapism  Date/Time: 05/28/2022 7:57 PM Performed by: Rada Hay, MD Authorized by: Rada Hay, MD  Consent: Verbal consent obtained. Written consent obtained. Consent given by: patient Patient identity confirmed: verbally with patient Preparation: Patient was prepped and draped in the usual sterile fashion. Local anesthesia used: yes Anesthesia: nerve block  Anesthesia: Local anesthesia used: yes Local Anesthetic: lidocaine 1% without epinephrine Anesthetic total: 5 mL  Sedation: Patient sedated: no  Patient tolerance: patient tolerated the procedure well with no immediate complications Comments: A total of 210 cc of blood was aspiration from the corpora     The patient is on the cardiac monitor to evaluate for evidence of arrhythmia and/or significant heart rate changes.   MEDICATIONS ORDERED IN ED: Medications  morphine (PF) 4 MG/ML injection 4 mg (has no administration in time range)   phenylephrine 200 mcg / ml CONC. DILUTION INJ (ED / Urology USE ONLY) (100 mcg Intracavernosal Given by Other 05/28/22 1800)  lidocaine (PF) (XYLOCAINE) 1 % injection 5 mL (5 mLs Infiltration Given by Other 05/28/22 1730)  lidocaine (PF) (XYLOCAINE) 1 % injection 5 mL (5 mLs Infiltration Given by Other 05/28/22 1750)     IMPRESSION / MDM / ASSESSMENT AND PLAN / ED COURSE  I reviewed the triage vital signs and the nursing notes.                              Patient's presentation is most consistent with acute presentation with potential threat to life or bodily function.  Differential diagnosis includes, but is not limited to, ischemic priapism nonischemic priapism  Patient is a 56 year old male on trazodone presents with priapism for about 7 hours.  There is no preceding trauma.  On exam he has a fully erect penis and is in significant distress.  Dorsal penile nerve block was performed patient was then given injection of phenylephrine intracavernosal he and 40 cc of dark blood was aspirated.  There was some improvement but no complete detumescence.  I spoke with Dr. Alfonse Spruce with urology who notes that you can aspirate significantly more volumes of blood and recommends continuing phenylephrine.  I aspirated total of 210 cc and he was given 600 mcg of intracavernosal phenylephrine.  At the end patient's erection is much improved although still not completely back to baseline.  Discussed again with Dr. Jeffie Pollock who notes that when the erection has lasted this long and you often have edema and that full detumescence is not possible.  He will evaluate the patient.  Did check hemoglobin as patient has baseline anemia hemoglobin is 7.9.  He has likely not completely calibrated however would not expect 200 cc to drop his hemoglobin more than 1 g.     Patient seen by Dr. Jeffie Pollock. He has completely detumesced.  Discharged with prescription for oxycodone for breakthrough pain.  He will follow-up with urology as an  outpatient.  FINAL CLINICAL IMPRESSION(S) / ED DIAGNOSES   Final diagnoses:  Priapism     Rx / DC Orders   ED Discharge Orders          Ordered    oxyCODONE (ROXICODONE) 5 MG immediate release tablet  Every 8 hours PRN  05/28/22 2016             Note:  This document was prepared using Dragon voice recognition software and may include unintentional dictation errors.   Rada Hay, MD 05/28/22 Lona Kettle    Rada Hay, MD 05/28/22 2017

## 2022-05-28 NOTE — ED Triage Notes (Signed)
Pt reports penile erection since 1030 this am. Pt does not take meds for ED.

## 2022-05-28 NOTE — Discharge Instructions (Addendum)
You are going to be very sore for the next several days.  You can take the oxycodone in addition to Tylenol and Motrin as needed for pain.  Please follow-up with urology in the next several days.  Is very important that you never take trazodone again.  The Cymbalta (duloxetine) and Seroquel (quetiapine) can also be assocaited with priapism and you should speak to your mental health provider about whether those need to be continued.   Many antidepressants can cause the problem.

## 2022-05-28 NOTE — ED Notes (Signed)
Pt provided with gown to change into. Chux placed under patient.

## 2022-05-28 NOTE — ED Notes (Signed)
1755: MD injected penis with 100 mcg phenylephrine  1758: MD aspirated blood from penis 1808: 100 mcg phenylephrine injected 1818: 100 mcg phenylephrine injected 1830: 100 mcg phenylephrine injected 1845: 100 mcg phenylephrine injected 1855: 100 mcg phenylephrine injected  Total aspirated blood: 210 mL
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
## 2022-06-01 ENCOUNTER — Other Ambulatory Visit: Payer: Self-pay

## 2022-06-01 ENCOUNTER — Ambulatory Visit: Payer: 59 | Admitting: Gastroenterology

## 2022-06-01 ENCOUNTER — Encounter: Payer: Self-pay | Admitting: Gastroenterology

## 2022-06-01 VITALS — BP 162/103 | HR 76 | Temp 97.8°F | Ht 72.0 in | Wt 181.5 lb

## 2022-06-01 DIAGNOSIS — D509 Iron deficiency anemia, unspecified: Secondary | ICD-10-CM | POA: Diagnosis not present

## 2022-06-01 DIAGNOSIS — R262 Difficulty in walking, not elsewhere classified: Secondary | ICD-10-CM | POA: Insufficient documentation

## 2022-06-01 DIAGNOSIS — M5136 Other intervertebral disc degeneration, lumbar region: Secondary | ICD-10-CM | POA: Insufficient documentation

## 2022-06-01 DIAGNOSIS — M25579 Pain in unspecified ankle and joints of unspecified foot: Secondary | ICD-10-CM | POA: Insufficient documentation

## 2022-06-01 MED ORDER — NA SULFATE-K SULFATE-MG SULF 17.5-3.13-1.6 GM/177ML PO SOLN: 354.0000 mL | Freq: Once | ORAL | 0 refills | Status: AC
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
## 2022-06-03 ENCOUNTER — Inpatient Hospital Stay: Payer: 59

## 2022-06-03 VITALS — BP 148/94 | HR 86 | Temp 99.1°F | Resp 16

## 2022-06-03 DIAGNOSIS — D508 Other iron deficiency anemias: Secondary | ICD-10-CM

## 2022-06-03 MED ORDER — IRON SUCROSE 20 MG/ML IV SOLN
200.0000 mg | Freq: Once | INTRAVENOUS | Status: AC
  Administered 2022-06-03: 200 mg via INTRAVENOUS
  Filled 2022-06-03: qty 10

## 2022-06-03 MED ORDER — SODIUM CHLORIDE 0.9 % IV SOLN
Freq: Once | INTRAVENOUS | Status: AC
  Filled 2022-06-03: qty 250

## 2022-06-03 MED ORDER — SODIUM CHLORIDE 0.9 % IV SOLN: 200.0000 mg | Freq: Once | INTRAVENOUS | Status: DC

## 2022-06-03 NOTE — Patient Instructions (Signed)
[facility]  Discharge Instructions: Thank you for choosing [facility] to provide your oncology and hematology care.  If you have a lab appointment with the Cancer Center, please go directly to the Cancer Center and check in at the registration area.  Wear comfortable clothing and clothing appropriate for easy access to any Portacath or PICC line.   We strive to give you quality time with your provider. You may need to reschedule your appointment if you arrive late (15 or more minutes).  Arriving late affects you and other patients whose appointments are after yours.  Also, if you miss three or more appointments without notifying the office, you may be dismissed from the clinic at the provider's discretion.      For prescription refill requests, have your pharmacy contact our office and allow 72 hours for refills to be completed.    Today you received the following chemotherapy and/or immunotherapy agents Venofer.      To help prevent nausea and vomiting after your treatment, we encourage you to take your nausea medication as directed.  BELOW ARE SYMPTOMS THAT SHOULD BE REPORTED IMMEDIATELY: *FEVER GREATER THAN 100.4 F (38 C) OR HIGHER *CHILLS OR SWEATING *NAUSEA AND VOMITING THAT IS NOT CONTROLLED WITH YOUR NAUSEA MEDICATION *UNUSUAL SHORTNESS OF BREATH *UNUSUAL BRUISING OR BLEEDING *URINARY PROBLEMS (pain or burning when urinating, or frequent urination) *BOWEL PROBLEMS (unusual diarrhea, constipation, pain near the anus) TENDERNESS IN MOUTH AND THROAT WITH OR WITHOUT PRESENCE OF ULCERS (sore throat, sores in mouth, or a toothache) UNUSUAL RASH, SWELLING OR PAIN  UNUSUAL VAGINAL DISCHARGE OR ITCHING   Items with * indicate a potential emergency and should be followed up as soon as possible or go to the Emergency Department if any problems should occur.  Please show the CHEMOTHERAPY ALERT CARD or IMMUNOTHERAPY ALERT CARD at check-in to  the Emergency Department and triage nurse.  Should you have questions after your visit or need to cancel or reschedule your appointment, please contact [facility]  [phone] and follow the prompts.  Office hours are 8:00 a.m. to 4:30 p.m. Monday - Friday. Please note that voicemails left after 4:00 p.m. may not be returned until the following business day.  We are closed weekends and major holidays. You have access to a nurse at all times for urgent questions. Please call the main number to the clinic [phone] and follow the prompts.  For any non-urgent questions, you may also contact your provider using MyChart. We now offer e-Visits for anyone 18 and older to request care online for non-urgent symptoms. For details visit [email or web address].   Also download the MyChart app! Go to the app store, search "MyChart", open the app, select [facility], and log in with your MyChart username and password.  Due to Covid, a mask is required upon entering the hospital/clinic. If you do not have a mask, one will be given to you upon arrival. For doctor visits, patients may have 1 support person aged 18 or older with them. For treatment visits, patients cannot have anyone with them due to current Covid guidelines and our immunocompromised population.  

## 2022-06-10 ENCOUNTER — Inpatient Hospital Stay: Payer: 59

## 2022-06-10 ENCOUNTER — Other Ambulatory Visit: Payer: Self-pay | Admitting: Specialist

## 2022-06-10 VITALS — BP 147/91 | HR 83 | Temp 98.7°F | Resp 16

## 2022-06-10 DIAGNOSIS — D508 Other iron deficiency anemias: Secondary | ICD-10-CM | POA: Diagnosis not present

## 2022-06-10 DIAGNOSIS — M4306 Spondylolysis, lumbar region: Secondary | ICD-10-CM

## 2022-06-10 MED ORDER — IRON SUCROSE 20 MG/ML IV SOLN
200.0000 mg | Freq: Once | INTRAVENOUS | Status: AC
  Administered 2022-06-10: 200 mg via INTRAVENOUS

## 2022-06-10 MED ORDER — SODIUM CHLORIDE 0.9 % IV SOLN: 200.0000 mg | Freq: Once | INTRAVENOUS | Status: DC

## 2022-06-10 MED ORDER — SODIUM CHLORIDE 0.9 % IV SOLN
Freq: Once | INTRAVENOUS | Status: AC
  Filled 2022-06-10: qty 250

## 2022-06-13 ENCOUNTER — Encounter: Payer: Self-pay | Admitting: Gastroenterology

## 2022-06-14 NOTE — Telephone Encounter (Signed)
Contacted patient and made him aware that Ambien would need to be prescribed by his primary care physician .

## 2022-06-16 ENCOUNTER — Ambulatory Visit
Admission: RE | Admit: 2022-06-16 | Discharge: 2022-06-16 | Disposition: A | Discharge status: Home / Self Care | Payer: 59 | Attending: Gastroenterology | Admitting: Gastroenterology

## 2022-06-16 ENCOUNTER — Telehealth: Payer: Self-pay

## 2022-06-16 ENCOUNTER — Encounter: Payer: Self-pay | Admitting: Gastroenterology

## 2022-06-16 ENCOUNTER — Ambulatory Visit: Payer: 59 | Admitting: Anesthesiology

## 2022-06-16 ENCOUNTER — Other Ambulatory Visit: Payer: Self-pay

## 2022-06-16 ENCOUNTER — Encounter
Admission: RE | Disposition: A | Discharge status: Home / Self Care | Payer: Self-pay | Source: Home / Self Care | Attending: Gastroenterology

## 2022-06-16 DIAGNOSIS — Z9884 Bariatric surgery status: Secondary | ICD-10-CM | POA: Diagnosis not present

## 2022-06-16 DIAGNOSIS — K644 Residual hemorrhoidal skin tags: Secondary | ICD-10-CM | POA: Diagnosis not present

## 2022-06-16 DIAGNOSIS — K648 Other hemorrhoids: Secondary | ICD-10-CM | POA: Insufficient documentation

## 2022-06-16 DIAGNOSIS — F419 Anxiety disorder, unspecified: Secondary | ICD-10-CM | POA: Insufficient documentation

## 2022-06-16 DIAGNOSIS — F17290 Nicotine dependence, other tobacco product, uncomplicated: Secondary | ICD-10-CM | POA: Insufficient documentation

## 2022-06-16 DIAGNOSIS — K635 Polyp of colon: Secondary | ICD-10-CM | POA: Diagnosis not present

## 2022-06-16 DIAGNOSIS — D123 Benign neoplasm of transverse colon: Secondary | ICD-10-CM | POA: Insufficient documentation

## 2022-06-16 DIAGNOSIS — M1990 Unspecified osteoarthritis, unspecified site: Secondary | ICD-10-CM | POA: Diagnosis not present

## 2022-06-16 DIAGNOSIS — D509 Iron deficiency anemia, unspecified: Secondary | ICD-10-CM | POA: Diagnosis present

## 2022-06-16 DIAGNOSIS — K2210 Ulcer of esophagus without bleeding: Secondary | ICD-10-CM | POA: Diagnosis not present

## 2022-06-16 DIAGNOSIS — G4730 Sleep apnea, unspecified: Secondary | ICD-10-CM | POA: Insufficient documentation

## 2022-06-16 HISTORY — PX: POLYPECTOMY: SHX5525

## 2022-06-16 HISTORY — PX: COLONOSCOPY WITH PROPOFOL: SHX5780

## 2022-06-16 HISTORY — PX: ESOPHAGOGASTRODUODENOSCOPY (EGD) WITH PROPOFOL: SHX5813

## 2022-06-16 SURGERY — COLONOSCOPY WITH PROPOFOL
Anesthesia: General | Site: Rectum

## 2022-06-16 MED ORDER — OMEPRAZOLE 40 MG PO CPDR: 40.0000 mg | DELAYED_RELEASE_CAPSULE | Freq: Two times a day (BID) | ORAL | 2 refills | Status: DC

## 2022-06-16 MED ORDER — PROPOFOL 10 MG/ML IV BOLUS
INTRAVENOUS | Status: DC | PRN
  Administered 2022-06-16 (×2): 40 mg via INTRAVENOUS
  Administered 2022-06-16: 50 mg via INTRAVENOUS
  Administered 2022-06-16: 200 mg via INTRAVENOUS
  Administered 2022-06-16: 50 mg via INTRAVENOUS
  Administered 2022-06-16: 40 mg via INTRAVENOUS
  Administered 2022-06-16 (×2): 50 mg via INTRAVENOUS
  Administered 2022-06-16 (×5): 40 mg via INTRAVENOUS

## 2022-06-16 MED ORDER — LACTATED RINGERS IV SOLN: INTRAVENOUS | Status: DC

## 2022-06-16 MED ORDER — ONDANSETRON HCL 4 MG/2ML IJ SOLN: 4.0000 mg | Freq: Once | INTRAMUSCULAR | Status: DC | PRN

## 2022-06-16 MED ORDER — ACETAMINOPHEN 325 MG PO TABS: 325.0000 mg | ORAL_TABLET | ORAL | Status: DC | PRN

## 2022-06-16 MED ORDER — LIDOCAINE HCL (CARDIAC) PF 100 MG/5ML IV SOSY
PREFILLED_SYRINGE | INTRAVENOUS | Status: DC | PRN
  Administered 2022-06-16: 30 mg via INTRAVENOUS

## 2022-06-16 MED ORDER — SODIUM CHLORIDE 0.9 % IV SOLN: INTRAVENOUS | Status: DC

## 2022-06-16 MED ORDER — GLYCOPYRROLATE 0.2 MG/ML IJ SOLN
INTRAMUSCULAR | Status: DC | PRN
  Administered 2022-06-16: .1 mg via INTRAVENOUS

## 2022-06-16 MED ORDER — ACETAMINOPHEN 160 MG/5ML PO SOLN: 325.0000 mg | ORAL | Status: DC | PRN

## 2022-06-16 MED ORDER — STERILE WATER FOR IRRIGATION IR SOLN
Status: DC | PRN
  Administered 2022-06-16: 200 mL

## 2022-06-16 SURGICAL SUPPLY — 38 items
BALLN DILATOR 10-12 8 (BALLOONS)
BALLN DILATOR 12-15 8 (BALLOONS)
BALLN DILATOR 15-18 8 (BALLOONS)
BALLN DILATOR CRE 0-12 8 (BALLOONS)
BALLN DILATOR ESOPH 8 10 CRE (MISCELLANEOUS) IMPLANT
BALLOON DILATOR 12-15 8 (BALLOONS) IMPLANT
BALLOON DILATOR 15-18 8 (BALLOONS) IMPLANT
BALLOON DILATOR CRE 0-12 8 (BALLOONS) IMPLANT
BLOCK BITE 60FR ADLT L/F GRN (MISCELLANEOUS) ×4 IMPLANT
CLIP HMST 235XBRD CATH ROT (MISCELLANEOUS) IMPLANT
CLIP RESOLUTION 360 11X235 (MISCELLANEOUS)
ELECT REM PT RETURN 9FT ADLT (ELECTROSURGICAL)
ELECTRODE REM PT RTRN 9FT ADLT (ELECTROSURGICAL) IMPLANT
FCP ESCP3.2XJMB 240X2.8X (MISCELLANEOUS)
FORCEPS BIOP RAD 4 LRG CAP 4 (CUTTING FORCEPS) IMPLANT
FORCEPS BIOP RJ4 240 W/NDL (MISCELLANEOUS)
FORCEPS ESCP3.2XJMB 240X2.8X (MISCELLANEOUS) IMPLANT
GOWN CVR UNV OPN BCK APRN NK (MISCELLANEOUS) ×6 IMPLANT
GOWN ISOL THUMB LOOP REG UNIV (MISCELLANEOUS) ×8
INJECTOR VARIJECT VIN23 (MISCELLANEOUS) IMPLANT
KIT DEFENDO VALVE AND CONN (KITS) IMPLANT
KIT PRC NS LF DISP ENDO (KITS) ×3 IMPLANT
KIT PROCEDURE OLYMPUS (KITS) ×4
MANIFOLD NEPTUNE II (INSTRUMENTS) ×4 IMPLANT
MARKER SPOT ENDO TATTOO 5ML (MISCELLANEOUS) IMPLANT
PROBE APC STR FIRE (PROBE) IMPLANT
RETRIEVER NET PLAT FOOD (MISCELLANEOUS) IMPLANT
RETRIEVER NET ROTH 2.5X230 LF (MISCELLANEOUS) IMPLANT
SNARE COLD EXACTO (MISCELLANEOUS) IMPLANT
SNARE SHORT THROW 13M SML OVAL (MISCELLANEOUS) IMPLANT
SNARE SHORT THROW 30M LRG OVAL (MISCELLANEOUS) IMPLANT
SNARE SNG USE RND 15MM (INSTRUMENTS) IMPLANT
SPOT EX ENDOSCOPIC TATTOO (MISCELLANEOUS)
SYR INFLATION 60ML (SYRINGE) IMPLANT
TRAP ETRAP POLY (MISCELLANEOUS) IMPLANT
VARIJECT INJECTOR VIN23 (MISCELLANEOUS)
WATER STERILE IRR 250ML POUR (IV SOLUTION) ×4 IMPLANT
WIRE CRE 18-20MM 8CM F G (MISCELLANEOUS) IMPLANT

## 2022-06-16 NOTE — H&P (Signed)
[name], MD [address]  [place]  [place], [place] [address]  Main: [phone]  Fax: [phone] Pager: [phone]  Primary Care Physician:  [name], MD Primary Gastroenterologist:  Dr. [name]  Pre-Procedure History & Physical: HPI:  Harold Smith is a 56 y.o. male is here for an endoscopy and colonoscopy.   Past Medical History:  Diagnosis Date   Allergic rhinitis due to pollen    Anxiety    Colon polyps    Diverticulitis large intestine    Obesity    Osteoarthritis, knee    Sleep apnea    does not use cpap   Sleep disturbance     Past Surgical History:  Procedure Laterality Date   ACHILLES TENDON REPAIR Right 12/26/2006   CATARACT EXTRACTION W/PHACO Left 07/20/2021   Procedure: CATARACT EXTRACTION PHACO AND INTRAOCULAR LENS PLACEMENT ([place]) LEFT 2.11 00:24.0;  Surgeon: Birder Robson, MD;  Location: [facility];  Service: Ophthalmology;  Laterality: Left;  sleep apnea   CATARACT EXTRACTION W/PHACO Right 08/03/2021   Procedure: CATARACT EXTRACTION PHACO AND INTRAOCULAR LENS PLACEMENT ([facility]) RIGHT;  Surgeon: Birder Robson, MD;  Location: [facility];  Service: Ophthalmology;  Laterality: Right;  3.19 0:33.0   CHOLECYSTECTOMY     COLON RESECTION Left 12/26/2004   due to diverticular disease at [place]  2013?   COLONOSCOPY WITH PROPOFOL N/A 05/22/2018   Procedure: COLONOSCOPY WITH PROPOFOL;  Surgeon: Lin Landsman, MD;  Location: [facility] ENDOSCOPY;  Service: Gastroenterology;  Laterality: N/A;   COLONOSCOPY WITH PROPOFOL N/A 10/22/2020   Procedure: COLONOSCOPY WITH PROPOFOL;  Surgeon: Lin Landsman, MD;  Location: [facility];  Service: Endoscopy;  Laterality: N/A;  priority 4   COLONOSCOPY WITH PROPOFOL N/A 11/10/2021   Procedure: COLONOSCOPY WITH PROPOFOL;  Surgeon: Lin Landsman, MD;  Location: [facility] ENDOSCOPY;  Service: Gastroenterology;  Laterality: N/A;   DRUG INDUCED  ENDOSCOPY N/A 06/10/2019   Procedure: DRUG INDUCED SLEEP ENDOSCOPY;  Surgeon: Jerrell Belfast, MD;  Location: [facility];  Service: ENT;  Laterality: N/A;   EYE SURGERY     GASTRIC BYPASS N/A 12/27/2007   HIATAL HERNIA REPAIR  09/25/2014   at [place]   POLYPECTOMY  10/22/2020   Procedure: POLYPECTOMY;  Surgeon: Lin Landsman, MD;  Location: [facility];  Service: Endoscopy;;   ROTATOR CUFF REPAIR     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 Authorizing Provider  cetirizine (ZYRTEC) 10 MG tablet Take 10 mg by mouth daily.   Yes [provider]  Cyanocobalamin (VITAMIN B-12 PO) Take 1 tablet by mouth daily.   Yes [provider]  cyclobenzaprine (FLEXERIL) 10 MG tablet Take 10 mg by mouth at bedtime. 02/07/22  Yes [provider]  DULoxetine (CYMBALTA) 60 MG capsule Take 1 capsule (60 mg total) by mouth daily. 04/12/22  Yes [name], MD  FEROSUL 325 (65 Fe) MG tablet Take 325 mg by mouth every morning. 04/14/22  Yes [provider]  fluticasone (FLONASE) 50 MCG/ACT nasal spray Place 2 sprays into both nostrils daily.   Yes [provider]  gabapentin (NEURONTIN) 300 MG capsule Take 1 capsule (300 mg total) by mouth 2 (two) times daily. Plus 4 at bedtime Patient taking differently: Take 1,200 mg by mouth at bedtime. 03/09/22  Yes [name], MD  HYDROcodone-acetaminophen (NORCO) 10-325 MG  tablet Take 1 tablet by mouth every 12 (twelve) hours as needed. 05/27/22  Yes Lanae Crumbly, PA-C  methocarbamol (ROBAXIN) 500 MG tablet Take 500 mg by mouth every 6 (six) hours as needed. 04/14/22  Yes [provider]  Multiple Vitamin (MULTIVITAMIN) tablet Take 1 tablet by mouth daily.   Yes [provider]  QUEtiapine (SEROQUEL) 25 MG tablet Take 75 mg by mouth at bedtime. 04/28/22  Yes [provider]  traZODone  (DESYREL) 50 MG tablet Take 1-2 tablets (50-100 mg total) by mouth at bedtime as needed for sleep. 05/26/22  Yes [name], MD  meloxicam (MOBIC) 15 MG tablet Take 15 mg by mouth daily as needed for pain. Patient not taking: Reported on 06/13/2022 04/14/22   [provider]  zolpidem (AMBIEN CR) 12.5 MG CR tablet Take 1 tablet (12.5 mg total) by mouth at bedtime as needed for sleep. Patient not taking: Reported on 06/13/2022 04/28/22   Jessy Oto, MD    Allergies as of 06/01/2022   (No Known Allergies)    Family History  Problem Relation Age of Onset   Hypertension Mother    Diabetes Mother    Kidney disease Mother    Heart disease Mother    Colon cancer Father 35   Asthma Sister    Obesity Sister    Stroke Sister    Obesity Brother    Diabetes Other    Heart disease Other    Hypertension Other    Kidney disease Other    Cancer Paternal Uncle        unk type, possible prostate   Lung cancer Paternal Grandmother     Social History   Socioeconomic History   Marital status: Single    Spouse name: Not on file   Number of children: 3   Years of education: Not on file   Highest education level: Not on file  Occupational History   Occupation: Production designer, theatre/television/film  Tobacco Use   Smoking status: Never    Passive exposure: Past   Smokeless tobacco: Never   Tobacco comments:    occasional cigar  Vaping Use   Vaping Use: Never used  Substance and Sexual Activity   Alcohol use: Yes    Alcohol/week: 0.0 standard drinks of alcohol    Comment: occasional   Drug use: No   Sexual activity: Yes  Other Topics Concern   Not on file  Social History Narrative   Lives with 2 daughters from 59st marriage   Shared custody of another daughter from 2nd marriage   Social Determinants of Health   Financial Resource Strain: Not on file  Food Insecurity: Not on file  Transportation Needs: Not on file  Physical Activity: Not on file  Stress: Not on file  Social  Connections: Not on file  Intimate Partner Violence: Not on file    Review of Systems: See HPI, otherwise negative ROS  Physical Exam: BP (!) 144/94   Pulse 67   Temp 98.1 F (36.7 C) (Temporal)   Ht 6' (1.829 m)   Wt 80.7 kg   SpO2 100%   BMI 24.14 kg/m  General:   Alert,  pleasant and cooperative in NAD Head:  Normocephalic and atraumatic. Neck:  Supple; no masses or thyromegaly. Lungs:  Clear throughout to auscultation.    Heart:  Regular rate and rhythm. Abdomen:  Soft, nontender and nondistended. Normal bowel sounds, without guarding, and without rebound.   Neurologic:  Alert and  oriented x4;  grossly normal neurologically.  Impression/Plan: Harold Smith is here for an endoscopy and colonoscopy to be performed for IDA  Risks, benefits, limitations, and alternatives regarding  endoscopy and colonoscopy have been reviewed with the patient.  Questions have been answered.  All parties agreeable.   [name], MD  06/16/2022, 9:39 AM

## 2022-06-16 NOTE — Anesthesia Procedure Notes (Signed)
Date/Time: 06/16/2022 10:33 AM  Performed by: Cameron Ali, CRNAPre-anesthesia Checklist: Patient identified, Emergency Drugs available, Suction available, Timeout performed and Patient being monitored Patient Re-evaluated:Patient Re-evaluated prior to induction Oxygen Delivery Method: Nasal cannula Placement Confirmation: positive ETCO2

## 2022-06-16 NOTE — Anesthesia Preprocedure Evaluation (Signed)
Anesthesia Evaluation  Patient identified by MRN, date of birth, ID band Patient awake    Reviewed: Allergy & Precautions, NPO status   History of Anesthesia Complications Negative for: history of anesthetic complications  Airway Mallampati: I   Neck ROM: Full    Dental   Missing molars x10:   Pulmonary sleep apnea , Current Smoker (occasional cigar) and Patient abstained from smoking.,    Pulmonary exam normal breath sounds clear to auscultation       Cardiovascular Exercise Tolerance: Good negative cardio ROS Normal cardiovascular exam Rhythm:Regular Rate:Normal     Neuro/Psych Anxiety negative neurological ROS     GI/Hepatic S/p gastric bypass   Endo/Other  negative endocrine ROS  Renal/GU      Musculoskeletal  (+) Arthritis ,   Abdominal   Peds  Hematology   Anesthesia Other Findings   Reproductive/Obstetrics                             Anesthesia Physical  Anesthesia Plan  ASA: 2  Anesthesia Plan: General   Post-op Pain Management: Minimal or no pain anticipated   Induction: Intravenous  PONV Risk Score and Plan: 2 and TIVA, Treatment may vary due to age or medical condition and Propofol infusion  Airway Management Planned: Nasal Cannula and Natural Airway  Additional Equipment:   Intra-op Plan:   Post-operative Plan:   Informed Consent: I have reviewed the patients History and Physical, chart, labs and discussed the procedure including the risks, benefits and alternatives for the proposed anesthesia with the patient or authorized representative who has indicated his/her understanding and acceptance.       Plan Discussed with: CRNA  Anesthesia Plan Comments:         Anesthesia Quick Evaluation

## 2022-06-16 NOTE — Anesthesia Postprocedure Evaluation (Signed)
Anesthesia Post Note  Patient: Harold Smith  Procedure(s) Performed: COLONOSCOPY WITH PROPOFOL (Rectum) ESOPHAGOGASTRODUODENOSCOPY (EGD) WITH PROPOFOL (Mouth) POLYPECTOMY (Rectum)     Patient location during evaluation: PACU Anesthesia Type: General Level of consciousness: awake Pain management: pain level controlled Vital Signs Assessment: post-procedure vital signs reviewed and stable Respiratory status: respiratory function stable Cardiovascular status: stable Postop Assessment: no apparent nausea or vomiting Anesthetic complications: no   No notable events documented.  Veda Canning

## 2022-06-16 NOTE — Telephone Encounter (Signed)
-----   Message from Lin Landsman, MD sent at 06/16/2022 11:36 AM EDT ----- Regarding: follow up Clinic follow up in 1-2 weeks, hemorrhoid banding for rectal bleeding, ok to overbook  RV

## 2022-06-16 NOTE — Telephone Encounter (Signed)
Patient is schedule they for 07/06/2022

## 2022-06-16 NOTE — Op Note (Signed)
[facility] Patient Name: Harold Smith Procedure Date: 06/16/2022 10:21 AM MRN: [id number] Account #: [id number] Date of Birth: 30-Oct-1966 Admit Type: Outpatient Age: 56 Room: [facility] OR ROOM 01 Gender: Male Note Status: Finalized Instrument Name: [id number] Procedure:             Colonoscopy Indications:           Last colonoscopy 3 years ago, Unexplained iron                         deficiency anemia Providers:             Lin Landsman MD, MD Referring MD:          [name] (Referring MD) Medicines:             General Anesthesia Complications:         No immediate complications. Estimated blood loss: None. Procedure:             Pre-Anesthesia Assessment:                        - Prior to the procedure, a History and Physical was                         performed, and patient medications and allergies were                         reviewed. The patient is competent. The risks and                         benefits of the procedure and the sedation options and                         risks were discussed with the patient. All questions                         were answered and informed consent was obtained.                         Patient identification and proposed procedure were                         verified by the physician, the nurse, the                         anesthesiologist, the anesthetist and the technician                         in the pre-procedure area in the procedure room in the                         endoscopy suite. Mental Status Examination: alert and                         oriented. Airway Examination: normal oropharyngeal                         airway and neck mobility. Respiratory Examination:  clear to auscultation. CV Examination: normal.                         Prophylactic Antibiotics: The patient does not require                         prophylactic antibiotics. Prior Anticoagulants:  The                         patient has taken no previous anticoagulant or                         antiplatelet agents. ASA Grade Assessment: II - A                         patient with mild systemic disease. After reviewing                         the risks and benefits, the patient was deemed in                         satisfactory condition to undergo the procedure. The                         anesthesia plan was to use general anesthesia.                         Immediately prior to administration of medications,                         the patient was re-assessed for adequacy to receive                         sedatives. The heart rate, respiratory rate, oxygen                         saturations, blood pressure, adequacy of pulmonary                         ventilation, and response to care were monitored                         throughout the procedure. The physical status of the                         patient was re-assessed after the procedure.                        After obtaining informed consent, the colonoscope was                         passed under direct vision. Throughout the procedure,                         the patient's blood pressure, pulse, and oxygen                         saturations were monitored continuously. The  Colonoscope was introduced through the anus and                         advanced to the the cecum, identified by appendiceal                         orifice and ileocecal valve. The colonoscopy was                         unusually difficult due to inadequate bowel prep and                         significant looping. Successful completion of the                         procedure was aided by applying abdominal pressure.                         The patient tolerated the procedure well. The quality                         of the bowel preparation was evaluated using the BBPS                         [facility] Bowel Preparation  Scale) with scores of: Right                         Colon = 2 (minor amount of residual staining, small                         fragments of stool and/or opaque liquid, but mucosa                         seen well), Transverse Colon = 2 (minor amount of                         residual staining, small fragments of stool and/or                         opaque liquid, but mucosa seen well) and Left Colon =                         1 (portion of mucosa seen, but other areas not well                         seen due to staining, residual stool and/or opaque                         liquid). The total BBPS score equals 5. The quality of                         the bowel preparation was inadequate. Findings:      The perianal and digital rectal examinations were normal. Pertinent       negatives include normal sphincter tone and no palpable rectal lesions.      Two sessile polyps were found in the descending colon and transverse  colon. The polyps were 5 to 6 mm in size. These polyps were removed with       a cold snare. Resection and retrieval were complete. Estimated blood       loss: none.      Non-bleeding external and internal hemorrhoids were found during       retroflexion. The hemorrhoids were large. Impression:            - Preparation of the colon was inadequate.                        - Two 5 to 6 mm polyps in the descending colon and in                         the transverse colon, removed with a cold snare.                         Resected and retrieved.                        - Non-bleeding external and internal hemorrhoids. Recommendation:        - Discharge patient to home (with escort).                        - Resume previous diet today.                        - Continue present medications.                        - Await pathology results.                        - Repeat colonoscopy in 6 months with 2 day prep                         because the bowel preparation was  suboptimal. Procedure Code(s):     --- Professional ---                        [phone], Colonoscopy, flexible; with removal of                         tumor(s), polyp(s), or other lesion(s) by snare                         technique Diagnosis Code(s):     --- Professional ---                        K64.8, Other hemorrhoids                        K63.5, Polyp of colon                        D50.9, Iron deficiency anemia, unspecified CPT copyright 2019 American Medical Association. All rights reserved. The codes documented in this report are preliminary and upon coder review may  be revised to meet current compliance requirements. Dr. Ulyess Mort Lin Landsman MD, MD 06/16/2022 11:23:50 AM This report has been signed electronically. Number of  Addenda: 0 Note Initiated On: 06/16/2022 10:21 AM Scope Withdrawal Time: 0 hours 7 minutes 36 seconds  Total Procedure Duration: 0 hours 17 minutes 40 seconds  Estimated Blood Loss:  Estimated blood loss: none.      [facility]

## 2022-06-16 NOTE — Transfer of Care (Signed)
Immediate Anesthesia Transfer of Care Note  Patient: Harold Smith  Procedure(s) Performed: COLONOSCOPY WITH PROPOFOL (Rectum) ESOPHAGOGASTRODUODENOSCOPY (EGD) WITH PROPOFOL (Mouth) POLYPECTOMY (Rectum)  Patient Location: PACU  Anesthesia Type: General  Level of Consciousness: awake, alert  and patient cooperative  Airway and Oxygen Therapy: Patient Spontanous Breathing and Patient connected to supplemental oxygen  Post-op Assessment: Post-op Vital signs reviewed, Patient's Cardiovascular Status Stable, Respiratory Function Stable, Patent Airway and No signs of Nausea or vomiting  Post-op Vital Signs: Reviewed and stable  Complications: No notable events documented.

## 2022-06-16 NOTE — Op Note (Signed)
[facility] Gastroenterology Patient Name: Harold Smith Procedure Date: 06/16/2022 10:24 AM MRN: [id number] Account #: [id number] Date of Birth: Nov 14, 1966 Admit Type: Outpatient Age: 56 Room: [facility] OR ROOM 01 Gender: Male Note Status: Finalized Instrument Name: [id number] Procedure:             Upper GI endoscopy Indications:           Unexplained iron deficiency anemia Providers:             Lin Landsman MD, MD Referring MD:          [name] (Referring MD) Medicines:             General Anesthesia Complications:         No immediate complications. Estimated blood loss: None. Procedure:             Pre-Anesthesia Assessment:                        - Prior to the procedure, a History and Physical was                         performed, and patient medications and allergies were                         reviewed. The patient is competent. The risks and                         benefits of the procedure and the sedation options and                         risks were discussed with the patient. All questions                         were answered and informed consent was obtained.                         Patient identification and proposed procedure were                         verified by the physician, the nurse, the                         anesthesiologist, the anesthetist and the technician                         in the pre-procedure area in the procedure room in the                         endoscopy suite. Mental Status Examination: alert and                         oriented. Airway Examination: normal oropharyngeal                         airway and neck mobility. Respiratory Examination:                         clear to auscultation. CV Examination: normal.  Prophylactic Antibiotics: The patient does not require                         prophylactic antibiotics. Prior Anticoagulants: The                         patient has taken  no previous anticoagulant or                         antiplatelet agents. ASA Grade Assessment: II - A                         patient with mild systemic disease. After reviewing                         the risks and benefits, the patient was deemed in                         satisfactory condition to undergo the procedure. The                         anesthesia plan was to use general anesthesia.                         Immediately prior to administration of medications,                         the patient was re-assessed for adequacy to receive                         sedatives. The heart rate, respiratory rate, oxygen                         saturations, blood pressure, adequacy of pulmonary                         ventilation, and response to care were monitored                         throughout the procedure. The physical status of the                         patient was re-assessed after the procedure.                        After obtaining informed consent, the endoscope was                         passed under direct vision. Throughout the procedure,                         the patient's blood pressure, pulse, and oxygen                         saturations were monitored continuously. The Endoscope                         was introduced through the mouth, and advanced to the  body of the stomach. The upper GI endoscopy was                         accomplished without difficulty. The patient tolerated                         the procedure well. Findings:      Evidence of a Roux-en-Y gastrojejunostomy was found. The gastrojejunal       anastomosis was characterized by Unable to traverse. This could not be       traversed. The pouch-to-jejunum limb was characterized by healthy       appearing mucosa. The duodenum-to-jejunum limb was not examined as it       could not be found. Biopsies were taken with a cold forceps for       histology.      LA Grade D (one or  more mucosal breaks involving at least 75% of       esophageal circumference) esophagitis with no bleeding was found at the       gastroesophageal junction. 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                         Biopsied.                        - LA Grade D erosive esophagitis with no bleeding. Recommendation:        - Await pathology results.                        - Proceed with colonoscopy as scheduled                        See colonoscopy report                        - Use Prilosec (omeprazole) 40 mg PO BID for 3 months.                        - Repeat upper endoscopy in 3 months to check healing. Procedure Code(s):     --- Professional ---                        [phone], 75, Esophagogastroduodenoscopy, flexible,                         transoral; with biopsy, single or multiple Diagnosis Code(s):     --- Professional ---                        K20.80, Other esophagitis without bleeding                        D50.9, Iron deficiency anemia, unspecified CPT copyright 2019 American Medical Association. All rights reserved. The codes documented in this report are preliminary and upon coder review may  be revised to meet current compliance requirements. Dr. Ulyess Mort Tally Due  Marius Ditch MD, MD 06/16/2022 10:59:20 AM This report has been signed electronically. Number of Addenda: 0 Note Initiated On: 06/16/2022 10:24 AM Estimated Blood Loss:  Estimated blood loss: none.      [facility]

## 2022-06-17 ENCOUNTER — Encounter: Payer: Self-pay | Admitting: Gastroenterology

## 2022-06-17 ENCOUNTER — Inpatient Hospital Stay: Payer: 59

## 2022-06-17 VITALS — BP 174/93 | HR 77 | Temp 97.5°F | Resp 18

## 2022-06-17 DIAGNOSIS — D508 Other iron deficiency anemias: Secondary | ICD-10-CM | POA: Diagnosis not present

## 2022-06-17 MED ORDER — IRON SUCROSE 20 MG/ML IV SOLN
200.0000 mg | Freq: Once | INTRAVENOUS | Status: AC
  Administered 2022-06-17: 200 mg via INTRAVENOUS
  Filled 2022-06-17: qty 10

## 2022-06-17 MED ORDER — SODIUM CHLORIDE 0.9 % IV SOLN
Freq: Once | INTRAVENOUS | Status: AC
  Filled 2022-06-17: qty 250

## 2022-06-17 MED ORDER — SODIUM CHLORIDE 0.9 % IV SOLN: 200.0000 mg | Freq: Once | INTRAVENOUS | Status: DC

## 2022-06-20 ENCOUNTER — Other Ambulatory Visit: Payer: Self-pay | Admitting: Unknown Physician Specialty

## 2022-06-20 DIAGNOSIS — R1310 Dysphagia, unspecified: Secondary | ICD-10-CM

## 2022-06-20 LAB — SURGICAL PATHOLOGY

## 2022-06-21 ENCOUNTER — Telehealth: Payer: Self-pay

## 2022-06-21 ENCOUNTER — Encounter: Payer: Self-pay | Admitting: Gastroenterology

## 2022-06-21 DIAGNOSIS — D509 Iron deficiency anemia, unspecified: Secondary | ICD-10-CM

## 2022-06-29 ENCOUNTER — Other Ambulatory Visit: Payer: Self-pay | Admitting: Internal Medicine

## 2022-06-30 ENCOUNTER — Other Ambulatory Visit (HOSPITAL_COMMUNITY): Payer: 59

## 2022-07-01 ENCOUNTER — Encounter: Payer: Self-pay | Admitting: Oncology

## 2022-07-01 ENCOUNTER — Ambulatory Visit
Admission: RE | Admit: 2022-07-01 | Discharge: 2022-07-01 | Disposition: A | Discharge status: Home / Self Care | Payer: 59 | Source: Ambulatory Visit | Attending: Gastroenterology | Admitting: Gastroenterology

## 2022-07-01 DIAGNOSIS — D509 Iron deficiency anemia, unspecified: Secondary | ICD-10-CM | POA: Diagnosis present

## 2022-07-04 ENCOUNTER — Telehealth: Payer: Self-pay

## 2022-07-04 DIAGNOSIS — Q433 Congenital malformations of intestinal fixation: Secondary | ICD-10-CM

## 2022-07-04 NOTE — Telephone Encounter (Signed)
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  Therefore, recommend CT abdomen and pelvis with contrast to evaluate the anatomy.  Please schedule CT abdomen and pelvis if patient is agreeable  RV

## 2022-07-04 NOTE — Telephone Encounter (Signed)
What diagnosis do I use for this. Patient verbalized understanding and is okay with CT scan

## 2022-07-05 ENCOUNTER — Encounter: Payer: Self-pay | Admitting: Oncology

## 2022-07-05 ENCOUNTER — Ambulatory Visit
Admission: RE | Admit: 2022-07-05 | Discharge: 2022-07-05 | Disposition: A | Discharge status: Home / Self Care | Payer: 59 | Source: Ambulatory Visit | Attending: Unknown Physician Specialty | Admitting: Unknown Physician Specialty

## 2022-07-05 DIAGNOSIS — R1314 Dysphagia, pharyngoesophageal phase: Secondary | ICD-10-CM | POA: Insufficient documentation

## 2022-07-05 DIAGNOSIS — R1310 Dysphagia, unspecified: Secondary | ICD-10-CM | POA: Diagnosis not present

## 2022-07-05 NOTE — Addendum Note (Signed)
Encounter addended by: Antonieta Iba, CCC-SLP on: 07/05/2022 4:48 PM  Actions taken: Clinical Note Signed, Episode resolved

## 2022-07-05 NOTE — Therapy (Addendum)
[place], [place], [address] Phone: [phone]   Fax:     Modified Barium Swallow  Patient Details  Name: Harold Smith MRN: [id number] Date of Birth: [birth year] No data recorded  Encounter Date: 07/05/2022   End of Session - 07/05/22 1444     Visit Number 1    Number of Visits 1    Date for SLP Re-Evaluation 07/05/22    SLP Start Time 63    SLP Stop Time  1400    SLP Time Calculation (min) 60 min    Activity Tolerance Patient tolerated treatment well             Past Medical History:  Diagnosis Date   Allergic rhinitis due to pollen    Anxiety    Colon polyps    Diverticulitis large intestine    Obesity    Osteoarthritis, knee    Sleep apnea    does not use cpap   Sleep disturbance     Past Surgical History:  Procedure Laterality Date   ACHILLES TENDON REPAIR Right 12/26/2006   CATARACT EXTRACTION W/PHACO Left 07/20/2021   Procedure: CATARACT EXTRACTION PHACO AND INTRAOCULAR LENS PLACEMENT ([place]) LEFT 2.11 00:24.0;  Surgeon: Birder Robson, MD;  Location: [place];  Service: Ophthalmology;  Laterality: Left;  sleep apnea   CATARACT EXTRACTION W/PHACO Right 08/03/2021   Procedure: CATARACT EXTRACTION PHACO AND INTRAOCULAR LENS PLACEMENT ([facility]) RIGHT;  Surgeon: Birder Robson, MD;  Location: [place];  Service: Ophthalmology;  Laterality: Right;  3.19 0:33.0   CHOLECYSTECTOMY     COLON RESECTION Left 12/26/2004   due to diverticular disease at [place]  2013?   COLONOSCOPY WITH PROPOFOL N/A 05/22/2018   Procedure: COLONOSCOPY WITH PROPOFOL;  Surgeon: Lin Landsman, MD;  Location: [facility] ENDOSCOPY;  Service: Gastroenterology;  Laterality: N/A;   COLONOSCOPY WITH PROPOFOL N/A 10/22/2020   Procedure: COLONOSCOPY WITH PROPOFOL;  Surgeon: Lin Landsman, MD;  Location: [place];  Service: Endoscopy;  Laterality: N/A;   priority 4   COLONOSCOPY WITH PROPOFOL N/A 11/10/2021   Procedure: COLONOSCOPY WITH PROPOFOL;  Surgeon: Lin Landsman, MD;  Location: [facility] ENDOSCOPY;  Service: Gastroenterology;  Laterality: N/A;   COLONOSCOPY WITH PROPOFOL N/A 06/16/2022   Procedure: COLONOSCOPY WITH PROPOFOL;  Surgeon: Lin Landsman, MD;  Location: [place];  Service: Endoscopy;  Laterality: N/A;   DRUG INDUCED ENDOSCOPY N/A 06/10/2019   Procedure: DRUG INDUCED SLEEP ENDOSCOPY;  Surgeon: Jerrell Belfast, MD;  Location: [place];  Service: ENT;  Laterality: N/A;   ESOPHAGOGASTRODUODENOSCOPY (EGD) WITH PROPOFOL N/A 06/16/2022   Procedure: ESOPHAGOGASTRODUODENOSCOPY (EGD) WITH PROPOFOL;  Surgeon: Lin Landsman, MD;  Location: [place];  Service: Endoscopy;  Laterality: N/A;   EYE SURGERY     GASTRIC BYPASS N/A 12/27/2007   HIATAL HERNIA REPAIR  09/25/2014   at [place]   POLYPECTOMY  10/22/2020   Procedure: POLYPECTOMY;  Surgeon: Lin Landsman, MD;  Location: [place];  Service: Endoscopy;;   POLYPECTOMY  06/16/2022   Procedure: POLYPECTOMY;  Surgeon: Lin Landsman, MD;  Location: [place];  Service: Endoscopy;;   ROTATOR CUFF REPAIR     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    There were no vitals filed for this visit.   Subjective: Patient behavior: (alertness, ability to follow instructions, etc.): pt A/O x4. Followed all instructions. Verbal and quite pleasant.  Chief complaint: dysphagia. Pt described Significant difficulty w/ Retrograde activity of REFLUX material, moreso during the night -- "it has come all the way up into my throat and out my nose when I was sleeping.". Pt described several incidents as well as endorsed other REFLUX behavior including Belching, Dry Cough, Sore throat, and Sinus Drainage. He feels discomfort from the REFLUX activity during the day. He Denied any neurological history(no stroke); he denied  any pulmonary history(no pneumonia).  OF NOTE: pt has had Gastric Bypass Surgery(2009) and Hiatal Hernia repair(2015). Recent dx of Erosive Esophagitis. He is on '60mg'$  of Protonix 2x daily. He eats a Regular diet at home.   OM Exam: WFL; no unilateral weakness. Cough strong. Native Dentition - missing bottom molars.     Objective:  Radiological Procedure: A videoflouroscopic evaluation of oral-preparatory, reflex initiation, and pharyngeal phases of the swallow was performed; as well as a screening of the upper esophageal phase.  POSTURE: upright VIEW: lateral COMPENSATORY STRATEGIES: f/u, Dry swallow intermittently BOLUSES ADMINISTERED:  Thin Liquid: 2 TSP; 5 Cup  Nectar-thick Liquid: 1 TSP; 3 Cup  Honey-thick Liquid: NT  Puree: 1 trial  Mechanical Soft: 1 trial RESULTS OF EVALUATION: ORAL PREPARATORY PHASE: (The lips, tongue, and velum are observed for strength and coordination)       **Overall Severity Rating: WFL. Bolus piecemealing occurred w/ both food and liquid trials. Any remaining oral residue was cleared w/ the f/u, Dry swallow b/t trials. Small sips and bites.  SWALLOW INITIATION/REFLEX: (The reflex is normal if "triggered" by the time the bolus reached the base of the tongue)  **Overall Severity Rating: WFL- Mild. Timing of the pharyngeal swallow appeared mildly delayed w/ liquid consistencies spilling to the Pyriform Sinuses during initiation of the pharyngeal swallow.   PHARYNGEAL PHASE: (Pharyngeal function is normal if the bolus shows rapid, smooth, and continuous transit through the pharynx and there is no pharyngeal residue after the swallow)  **Overall Severity Rating: [facility].  LARYNGEAL PENETRATION: (Material entering into the laryngeal inlet/vestibule but not aspirated): NONE ASPIRATION: NONE ESOPHAGEAL PHASE: (Screening of the upper esophagus): appeared [facility] w/in viewable cervical esophagus   ASSESSMENT: Pt appears to present w/ grossly adequate and functional  oropharyngeal phase swallowing w/ No laryngeal penetration nor aspiration noted during this study. No neuromuscular deficits noted during the oropharyngeal phases of swallowing. Suspect mildly decreased pharyngeal sensation evidenced by a mild delay in pharyngeal swallow initiation during the pharyngeal phase of swallowing (w/ liquid consistencies) is d/t the impact of Significant REFLUX activity and the insult of Acidic REFLUX material moving through the pharynx from the stomach during Retrograde activity, LPR described by pt -- pt reports frequent episodes moreso during the night -- "it has come all the way up into my throat and out my nose when I was sleeping.".   Any Esophageal phase dysmotility/LPR can have impact on the pharyngeal phase of swallowing over time. Also suspect the decreased pharyngeal sensation for timing of pharyngeal swallowing may be related to pt's c/o "coughing on my own saliva sometimes".  Pt exhibited adequate airway protection during swallowing w/ all consistencies including thin liquids during this study today when using small sips and drinking slowly.   During the oral phase, no gross deficits were noted; adequate bolus management, A-P transfer, and oral clearing occurred w/ all trial consistencies. Bolus piecemealing occurred w/ both food and liquid trials. Any remaining oral residue was cleared w/ the f/u, Dry swallow b/t trials. Small sips and bites taken baseline. During the pharyngeal  phase, pt exhibited a mildly delayed pharyngeal swallow initiation w/ thin and nectar liquids intermittently in which liquids were noted to spill from the valleculae to the pyriform sinuses as the pharyngeal swallow initiation occurred. This can be seen as secondary to effects of REFLUX/LPR impacting sensation of pharynx/pharyngeal tissue. No laryngeal penetration nor aspiration occurred during Cup sip trials. Pt appeared to exhibit best control of the swallow when using Single, Small sips. No  pharyngeal residue remained post swallows indicating adequate hyolaryngeal excursion and pharyngeal pressure/stripping during the swallow.  The viewable cervical esophagus appeared wfl for bolus motility and clearing (in the cervical esophagus). OF NOTE: post all po trials/end of session, pt exhibited a dry, Hacking Cough and Belching.  Discussion on REFLUX precautions, general aspiration precautions, and food prep recommendations. Encouraged continued f/u w/ GI for PPI management and discussion of REFLUX s/s.     PLAN/RECOMMENDATIONS:  A. Diet: continue a Regular diet w/ Thin liquids; increased awareness of intake/swallowing. Pills in Puree if needed for increased control when swallowing.  B. Swallowing Precautions: strict REFLUX precautions(per GI); general aspiration precautions  C. Recommended consultation to: continue f/u w/ GI for REFLUX management; PPI.  D. Therapy recommendations: NONE  E. Results and above recommendations were discussed w/ pt; video viewed and questions answered. Pt had no further questions.     Dysphagia, pharyngoesophageal phase  Dysphagia, unspecified type - Plan: DG SWALLOW FUNC SPEECH PATH, DG SWALLOW [facility] SPEECH PATH        Problem List Patient Active Problem List   Diagnosis Date Noted   Erosive esophagitis    Disability of walking 06/01/2022   Pain in joint involving ankle and foot 06/01/2022   Degeneration of lumbar intervertebral disc 06/01/2022   Iron deficiency anemia 05/10/2022   Microcytic anemia 05/04/2022   Numbness and tingling 04/18/2022   Imbalance 04/18/2022   Difficulty sleeping 04/18/2022   Lumbar spondylolysis 02/25/2022   Influenza A 10/26/2021   Sleep disturbance 07/02/2021   Elevated blood pressure reading 10/14/2019   Obstructive sleep apnea 05/28/2019   Preventative health care 06/23/2018   Personal hx of gastric bypass 06/23/2018   Erectile dysfunction 06/23/2018   History of colonic polyps    Rectal bleeding  12/22/2015   Diverticulosis large intestine w/o perforation or abscess w/bleeding    Colon polyps           Orinda Kenner, MS, CCC-SLP Speech Language Pathologist Rehab Services; [place] [phone] ([address] [place], [place] 07/05/2022, 2:45 PM  [place], [place], [address] Phone: [phone]   Fax:     Name: Harold Smith MRN: [id number] Date of Birth: [birth year]

## 2022-07-05 NOTE — Telephone Encounter (Signed)
Called Central scheduling and got patient schedule for 07/13/2022 at 8am arrive at 7:30am to medical mall.  nothing to eat or drink 4 hours prior and pick up oral contrast. Patient verbalized understanding of instructions

## 2022-07-06 ENCOUNTER — Ambulatory Visit: Payer: 59 | Admitting: Gastroenterology

## 2022-07-06 ENCOUNTER — Other Ambulatory Visit: Payer: Self-pay

## 2022-07-06 ENCOUNTER — Encounter: Payer: Self-pay | Admitting: Gastroenterology

## 2022-07-06 VITALS — BP 149/101 | HR 123 | Temp 98.8°F | Ht 72.0 in | Wt 177.0 lb

## 2022-07-06 DIAGNOSIS — K529 Noninfective gastroenteritis and colitis, unspecified: Secondary | ICD-10-CM

## 2022-07-06 DIAGNOSIS — Q433 Congenital malformations of intestinal fixation: Secondary | ICD-10-CM

## 2022-07-06 DIAGNOSIS — K640 First degree hemorrhoids: Secondary | ICD-10-CM

## 2022-07-06 DIAGNOSIS — D509 Iron deficiency anemia, unspecified: Secondary | ICD-10-CM | POA: Diagnosis not present
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
## 2022-07-06 NOTE — Progress Notes (Signed)

## 2022-07-07 ENCOUNTER — Ambulatory Visit: Payer: 59 | Admitting: Urology

## 2022-07-08 ENCOUNTER — Encounter: Payer: Self-pay | Admitting: Internal Medicine

## 2022-07-08 ENCOUNTER — Other Ambulatory Visit: Payer: Self-pay | Admitting: Internal Medicine

## 2022-07-08 ENCOUNTER — Ambulatory Visit (INDEPENDENT_AMBULATORY_CARE_PROVIDER_SITE_OTHER): Payer: 59 | Admitting: Internal Medicine

## 2022-07-08 VITALS — BP 128/80 | HR 80 | Temp 97.1°F | Ht 70.0 in | Wt 178.0 lb

## 2022-07-08 DIAGNOSIS — Z125 Encounter for screening for malignant neoplasm of prostate: Secondary | ICD-10-CM | POA: Diagnosis not present

## 2022-07-08 DIAGNOSIS — Z0000 Encounter for general adult medical examination without abnormal findings: Secondary | ICD-10-CM | POA: Diagnosis not present

## 2022-07-08 DIAGNOSIS — M5136 Other intervertebral disc degeneration, lumbar region: Secondary | ICD-10-CM

## 2022-07-08 DIAGNOSIS — K2210 Ulcer of esophagus without bleeding: Secondary | ICD-10-CM | POA: Diagnosis not present

## 2022-07-08 DIAGNOSIS — F39 Unspecified mood [affective] disorder: Secondary | ICD-10-CM | POA: Diagnosis not present

## 2022-07-08 LAB — CBC
HCT: 33.5 % — ABNORMAL LOW (ref 39.0–52.0)
Hemoglobin: 10.9 g/dL — ABNORMAL LOW (ref 13.0–17.0)
MCHC: 32.6 g/dL (ref 30.0–36.0)
MCV: 83 fl (ref 78.0–100.0)
Platelets: 255 10*3/uL (ref 150.0–400.0)
RBC: 4.04 Mil/uL — ABNORMAL LOW (ref 4.22–5.81)
RDW: 29.2 % — ABNORMAL HIGH (ref 11.5–15.5)
WBC: 7.5 10*3/uL (ref 4.0–10.5)

## 2022-07-08 LAB — RENAL FUNCTION PANEL
Albumin: 3.8 g/dL (ref 3.5–5.2)
BUN: 13 mg/dL (ref 6–23)
CO2: 26 mEq/L (ref 19–32)
Calcium: 8.5 mg/dL (ref 8.4–10.5)
Chloride: 107 mEq/L (ref 96–112)
Creatinine, Ser: 1.01 mg/dL (ref 0.40–1.50)
GFR: 82.17 mL/min (ref >60.00)
Glucose, Bld: 83 mg/dL (ref 70–99)
Phosphorus: 3.6 mg/dL (ref 2.3–4.6)
Potassium: 3.4 mEq/L — ABNORMAL LOW (ref 3.5–5.1)
Sodium: 140 mEq/L (ref 135–145)

## 2022-07-08 LAB — PSA: PSA: 0.99 ng/mL (ref 0.10–4.00)

## 2022-07-08 MED ORDER — MELOXICAM 15 MG PO TABS: 15.0000 mg | ORAL_TABLET | Freq: Every day | ORAL | 3 refills | Status: DC | PRN

## 2022-07-08 MED ORDER — ZOLPIDEM TARTRATE ER 12.5 MG PO TBCR: 12.5000 mg | EXTENDED_RELEASE_TABLET | Freq: Every evening | ORAL | 0 refills | Status: DC | PRN

## 2022-07-08 NOTE — Progress Notes (Signed)
Subjective:    Patient ID: Harold Smith, male    DOB: 02/22/1966, 56 y.o.   MRN: [id number]  HPI Here for physical  Still having bad back pain Awaiting improvement in anemia to hopefully proceed with the surgery Had CT scan to follow up on UGI series Eating okay---but still losing weight Did have EGD and colon Doing some stool samples--to check pancreatic function, etc  Priapism episode recently--taken off seroquel, trazodone and duloxetine Not sleeping now--had a few ambien Taking '1800mg'$  gabapentin--helps a little Had changed the meloxicam and muslce relaxers to prn---asked him to take the meloxicam regularly  Still no income Depressed about all this Now insurance is changing---Friday plan   Current Outpatient Medications on File Prior to Visit  Medication Sig Dispense Refill   cetirizine (ZYRTEC) 10 MG tablet Take 10 mg by mouth daily.     Cyanocobalamin (VITAMIN B-12 PO) Take 1 tablet by mouth daily.     cyclobenzaprine (FLEXERIL) 10 MG tablet Take 10 mg by mouth at bedtime.     FEROSUL 325 (65 Fe) MG tablet TAKE 1 TABLET BY MOUTH DAILY WITH BREAKFAST 30 tablet 11   fluticasone (FLONASE) 50 MCG/ACT nasal spray Place 2 sprays into both nostrils daily.     gabapentin (NEURONTIN) 300 MG capsule Take 1 capsule (300 mg total) by mouth 2 (two) times daily. Plus 4 at bedtime (Patient taking differently: Take 1,200 mg by mouth at bedtime.) 180 capsule 1   meloxicam (MOBIC) 15 MG tablet Take 15 mg by mouth daily as needed for pain.     methocarbamol (ROBAXIN) 500 MG tablet Take 500 mg by mouth every 6 (six) hours as needed.     Multiple Vitamin (MULTIVITAMIN) tablet Take 1 tablet by mouth daily.     omeprazole (PRILOSEC) 40 MG capsule Take 1 capsule (40 mg total) by mouth 2 (two) times daily before a meal. 60 capsule 2   No current facility-administered medications on file prior to visit.    No Known Allergies  Past Medical History:  Diagnosis Date   Allergic rhinitis due  to pollen    Anxiety    Colon polyps    Diverticulitis large intestine    Obesity    Osteoarthritis, knee    Sleep apnea    does not use cpap   Sleep disturbance     Past Surgical History:  Procedure Laterality Date   ACHILLES TENDON REPAIR Right 12/26/2006   CATARACT EXTRACTION W/PHACO Left 07/20/2021   Procedure: CATARACT EXTRACTION PHACO AND INTRAOCULAR LENS PLACEMENT ([place]) LEFT 2.11 00:24.0;  Surgeon: Birder Robson, MD;  Location: [place];  Service: Ophthalmology;  Laterality: Left;  sleep apnea   CATARACT EXTRACTION W/PHACO Right 08/03/2021   Procedure: CATARACT EXTRACTION PHACO AND INTRAOCULAR LENS PLACEMENT ([facility]) RIGHT;  Surgeon: Birder Robson, MD;  Location: [place];  Service: Ophthalmology;  Laterality: Right;  3.19 0:33.0   CHOLECYSTECTOMY     COLON RESECTION Left 12/26/2004   due to diverticular disease at [place]  2013?   COLONOSCOPY WITH PROPOFOL N/A 05/22/2018   Procedure: COLONOSCOPY WITH PROPOFOL;  Surgeon: Lin Landsman, MD;  Location: [facility] ENDOSCOPY;  Service: Gastroenterology;  Laterality: N/A;   COLONOSCOPY WITH PROPOFOL N/A 10/22/2020   Procedure: COLONOSCOPY WITH PROPOFOL;  Surgeon: Lin Landsman, MD;  Location: [place];  Service: Endoscopy;  Laterality: N/A;  priority 4   COLONOSCOPY WITH PROPOFOL N/A 11/10/2021   Procedure: COLONOSCOPY WITH PROPOFOL;  Surgeon: Lin Landsman,  MD;  Location: [facility] ENDOSCOPY;  Service: Gastroenterology;  Laterality: N/A;   COLONOSCOPY WITH PROPOFOL N/A 06/16/2022   Procedure: COLONOSCOPY WITH PROPOFOL;  Surgeon: Lin Landsman, MD;  Location: [place];  Service: Endoscopy;  Laterality: N/A;   DRUG INDUCED ENDOSCOPY N/A 06/10/2019   Procedure: DRUG INDUCED SLEEP ENDOSCOPY;  Surgeon: Jerrell Belfast, MD;  Location: [place];  Service: ENT;  Laterality: N/A;   ESOPHAGOGASTRODUODENOSCOPY (EGD) WITH PROPOFOL N/A 06/16/2022    Procedure: ESOPHAGOGASTRODUODENOSCOPY (EGD) WITH PROPOFOL;  Surgeon: Lin Landsman, MD;  Location: [place];  Service: Endoscopy;  Laterality: N/A;   EYE SURGERY     GASTRIC BYPASS N/A 12/27/2007   HIATAL HERNIA REPAIR  09/25/2014   at [place]   POLYPECTOMY  10/22/2020   Procedure: POLYPECTOMY;  Surgeon: Lin Landsman, MD;  Location: [place];  Service: Endoscopy;;   POLYPECTOMY  06/16/2022   Procedure: POLYPECTOMY;  Surgeon: Lin Landsman, MD;  Location: [place];  Service: Endoscopy;;   ROTATOR CUFF REPAIR     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    Family History  Problem Relation Age of Onset   Hypertension Mother    Diabetes Mother    Kidney disease Mother    Heart disease Mother    Colon cancer Father 61   Asthma Sister    Obesity Sister    Stroke Sister    Obesity Brother    Diabetes Other    Heart disease Other    Hypertension Other    Kidney disease Other    Cancer Paternal Uncle        unk type, possible prostate   Lung cancer Paternal Grandmother     Social History   Socioeconomic History   Marital status: Single    Spouse name: Not on file   Number of children: 3   Years of education: Not on file   Highest education level: Not on file  Occupational History   Occupation: Production designer, theatre/television/film  Tobacco Use   Smoking status: Never    Passive exposure: Past   Smokeless tobacco: Never   Tobacco comments:    occasional cigar  Vaping Use   Vaping Use: Never used  Substance and Sexual Activity   Alcohol use: Yes    Alcohol/week: 0.0 standard drinks of alcohol    Comment: occasional   Drug use: No   Sexual activity: Yes  Other Topics Concern   Not on file  Social History Narrative   Lives with 2 daughters from 3st marriage   Shared custody of another daughter from 2nd marriage   Social Determinants of Health   Financial Resource Strain: Not on file  Food Insecurity: Not on file   Transportation Needs: Not on file  Physical Activity: Not on file  Stress: Not on file  Social Connections: Not on file  Intimate Partner Violence: Not on file   Review of Systems  Constitutional:        Has been losing weight Wears seat belt  HENT:  Negative for dental problem, hearing loss and trouble swallowing.        Needs new dentist--insurance problem  Eyes:        Vision still fuzzy despite cataract removal Reading glasses  Respiratory:  Negative for shortness of breath.        Cough due to dry throat  Cardiovascular:  Negative for chest pain, palpitations and leg swelling.  Gastrointestinal:  Positive for anal  bleeding.       Just had hemorrhoid banded No heartburn on omeprazole  Endocrine: Negative for polydipsia and polyuria.  Genitourinary:  Negative for dysuria.       Slow stream at times---other times fine  Musculoskeletal:  Positive for back pain.  Skin:  Negative for rash.       Has spot on ankle for over a year  Allergic/Immunologic: Positive for environmental allergies. Negative for immunocompromised state.       Uses zyrtec daily  Neurological:  Negative for syncope and headaches.  Hematological:  Negative for adenopathy.  Psychiatric/Behavioral:  Positive for dysphoric mood and sleep disturbance. The patient is nervous/anxious.        Objective:   Physical Exam Constitutional:      Appearance: Normal appearance.     Comments: Obvious pain just moving around  HENT:     Nose: No congestion or rhinorrhea.     Mouth/Throat:     Pharynx: No oropharyngeal exudate or posterior oropharyngeal erythema.  Cardiovascular:     Rate and Rhythm: Normal rate and regular rhythm.     Pulses: Normal pulses.     Heart sounds: No murmur heard.    No gallop.  Pulmonary:     Effort: Pulmonary effort is normal.     Breath sounds: Normal breath sounds. No wheezing or rales.  Abdominal:     Palpations: Abdomen is soft.     Tenderness: There is no abdominal  tenderness.  Musculoskeletal:     Cervical back: Neck supple.     Right lower leg: No edema.     Left lower leg: No edema.  Lymphadenopathy:     Cervical: No cervical adenopathy.  Skin:    Comments: 1 cm cyst on inner left ankle  Neurological:     Mental Status: He is alert and oriented to person, place, and time.  Psychiatric:        Mood and Affect: Mood normal.        Behavior: Behavior normal.            Assessment & Plan:

## 2022-07-08 NOTE — Assessment & Plan Note (Signed)
Pain related Off duloxetine and trazodone due to priapism Will give a few ambien to help sleep (advised about dependence)

## 2022-07-08 NOTE — Assessment & Plan Note (Signed)
Waiting for surgery ---anemia needs to be reversed

## 2022-07-08 NOTE — Assessment & Plan Note (Signed)
Continues on the omeprazole daily

## 2022-07-08 NOTE — Assessment & Plan Note (Signed)
Will need another colonoscopy in the near future Discussed PSA --will check Updated COVID and flu vaccines in the fall

## 2022-07-11 ENCOUNTER — Other Ambulatory Visit: Payer: Self-pay

## 2022-07-11 ENCOUNTER — Encounter: Payer: Self-pay | Admitting: Oncology

## 2022-07-11 MED ORDER — FLUTICASONE PROPIONATE 50 MCG/ACT NA SUSP
2.0000 | Freq: Every day | NASAL | 3 refills | Status: DC
  Filled 2022-07-11: qty 48, fill #0
  Filled 2022-07-11: qty 16, 30d supply, fill #0

## 2022-07-11 NOTE — Telephone Encounter (Signed)
Spoke to pt. He is seeing Dr Louanne Skye. I have routed the most recent CBC to Dr Louanne Skye for him.

## 2022-07-12 ENCOUNTER — Other Ambulatory Visit: Payer: Self-pay | Admitting: Gastroenterology

## 2022-07-12 ENCOUNTER — Encounter: Payer: Self-pay | Admitting: Oncology

## 2022-07-12 ENCOUNTER — Ambulatory Visit: Payer: 59 | Admitting: Internal Medicine

## 2022-07-12 ENCOUNTER — Ambulatory Visit (INDEPENDENT_AMBULATORY_CARE_PROVIDER_SITE_OTHER): Payer: 59 | Admitting: Urology

## 2022-07-12 ENCOUNTER — Other Ambulatory Visit: Payer: Self-pay

## 2022-07-12 VITALS — BP 165/112 | HR 82 | Ht 71.0 in | Wt 180.0 lb

## 2022-07-12 DIAGNOSIS — D1772 Benign lipomatous neoplasm of other genitourinary organ: Secondary | ICD-10-CM | POA: Diagnosis not present

## 2022-07-12 DIAGNOSIS — R3129 Other microscopic hematuria: Secondary | ICD-10-CM | POA: Diagnosis not present

## 2022-07-12 DIAGNOSIS — Z87438 Personal history of other diseases of male genital organs: Secondary | ICD-10-CM | POA: Diagnosis not present

## 2022-07-12 DIAGNOSIS — R350 Frequency of micturition: Secondary | ICD-10-CM

## 2022-07-12 DIAGNOSIS — R319 Hematuria, unspecified: Secondary | ICD-10-CM

## 2022-07-12 DIAGNOSIS — Q433 Congenital malformations of intestinal fixation: Secondary | ICD-10-CM

## 2022-07-12 DIAGNOSIS — N492 Inflammatory disorders of scrotum: Secondary | ICD-10-CM

## 2022-07-12 LAB — URINALYSIS, COMPLETE
Bilirubin, UA: NEGATIVE
Glucose, UA: NEGATIVE
Ketones, UA: NEGATIVE
Leukocytes,UA: NEGATIVE
Nitrite, UA: NEGATIVE
Specific Gravity, UA: 1.02 (ref 1.005–1.030)
Urobilinogen, Ur: 0.2 mg/dL (ref 0.2–1.0)
pH, UA: 5.5 (ref 5.0–7.5)

## 2022-07-12 LAB — MICROSCOPIC EXAMINATION

## 2022-07-12 NOTE — Progress Notes (Signed)
07/12/22 8:22 AM   Harold Smith [birth year] [id number]  Referring provider:  [name], MD [address] [place],  [place] [address] Chief Complaint  Patient presents with   Urinary Incontinence    HPI:  Harold Smith is a 56 y.o.male who presents today for a follow-up post ED visit.   He was last seen in clinic in 2020 by me he was noted to have testicular pain.   He was seen in the ED on 05/28/2022 for priapism. Dorsal penile nerve block was performed patient was then given injection of phenylephrine intracavernosal he and 40 cc of dark blood was aspirated.  There was some improvement but no complete detumescence. A total of 210 cc was aspirated and he was given 600 mcg of intracavernosal phenylephrine.   No personal history of priapism.  He since stopped taking trazodone completely.  He reports a lump adjacent to his left testicle in his scrotum which he would like to have checked today.   Since his episode of priapism he has not tried to have an erection.  He denies any ED prior to this.  He does report some mild urinary symptoms including urgency frequency at times.   PMH: Past Medical History:  Diagnosis Date   Allergic rhinitis due to pollen    Anxiety    Colon polyps    Diverticulitis large intestine    Obesity    Osteoarthritis, knee    Sleep apnea    does not use cpap   Sleep disturbance     Surgical History: Past Surgical History:  Procedure Laterality Date   ACHILLES TENDON REPAIR Right 12/26/2006   CATARACT EXTRACTION W/PHACO Left 07/20/2021   Procedure: CATARACT EXTRACTION PHACO AND INTRAOCULAR LENS PLACEMENT ([place]) LEFT 2.11 00:24.0;  Surgeon: Birder Robson, MD;  Location: [place];  Service: Ophthalmology;  Laterality: Left;  sleep apnea   CATARACT EXTRACTION W/PHACO Right 08/03/2021   Procedure: CATARACT EXTRACTION PHACO AND INTRAOCULAR LENS PLACEMENT ([facility]) RIGHT;  Surgeon: Birder Robson, MD;  Location:  [place];  Service: Ophthalmology;  Laterality: Right;  3.19 0:33.0   CHOLECYSTECTOMY     COLON RESECTION Left 12/26/2004   due to diverticular disease at [place]  2013?   COLONOSCOPY WITH PROPOFOL N/A 05/22/2018   Procedure: COLONOSCOPY WITH PROPOFOL;  Surgeon: Lin Landsman, MD;  Location: [facility] ENDOSCOPY;  Service: Gastroenterology;  Laterality: N/A;   COLONOSCOPY WITH PROPOFOL N/A 10/22/2020   Procedure: COLONOSCOPY WITH PROPOFOL;  Surgeon: Lin Landsman, MD;  Location: [place];  Service: Endoscopy;  Laterality: N/A;  priority 4   COLONOSCOPY WITH PROPOFOL N/A 11/10/2021   Procedure: COLONOSCOPY WITH PROPOFOL;  Surgeon: Lin Landsman, MD;  Location: [facility] ENDOSCOPY;  Service: Gastroenterology;  Laterality: N/A;   COLONOSCOPY WITH PROPOFOL N/A 06/16/2022   Procedure: COLONOSCOPY WITH PROPOFOL;  Surgeon: Lin Landsman, MD;  Location: [place];  Service: Endoscopy;  Laterality: N/A;   DRUG INDUCED ENDOSCOPY N/A 06/10/2019   Procedure: DRUG INDUCED SLEEP ENDOSCOPY;  Surgeon: Jerrell Belfast, MD;  Location: [place];  Service: ENT;  Laterality: N/A;   ESOPHAGOGASTRODUODENOSCOPY (EGD) WITH PROPOFOL N/A 06/16/2022   Procedure: ESOPHAGOGASTRODUODENOSCOPY (EGD) WITH PROPOFOL;  Surgeon: Lin Landsman, MD;  Location: [place];  Service: Endoscopy;  Laterality: N/A;   EYE SURGERY     GASTRIC BYPASS N/A 12/27/2007   HIATAL HERNIA REPAIR  09/25/2014   at [facility]   POLYPECTOMY  10/22/2020  Procedure: POLYPECTOMY;  Surgeon: Lin Landsman, MD;  Location: [place];  Service: Endoscopy;;   POLYPECTOMY  06/16/2022   Procedure: POLYPECTOMY;  Surgeon: Lin Landsman, MD;  Location: [place];  Service: Endoscopy;;   ROTATOR CUFF REPAIR     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 If you have any questions, ask your nurse or doctor.          cetirizine 10 MG tablet Commonly known as: ZYRTEC Take 10 mg by mouth daily.   cyclobenzaprine 10 MG tablet Commonly known as: FLEXERIL Take 10 mg by mouth at bedtime.   FeroSul 325 (65 FE) MG tablet Generic drug: ferrous sulfate TAKE 1 TABLET BY MOUTH DAILY WITH BREAKFAST   fluticasone 50 MCG/ACT nasal spray Commonly known as: FLONASE Place 2 sprays into both nostrils daily.   gabapentin 300 MG capsule Commonly known as: NEURONTIN Take 1 capsule (300 mg total) by mouth 2 (two) times daily. Plus 4 at bedtime What changed:  how much to take when to take this additional instructions   meloxicam 15 MG tablet Commonly known as: MOBIC Take 1 tablet (15 mg total) by mouth daily as needed for pain.   methocarbamol 500 MG tablet Commonly known as: ROBAXIN Take 500 mg by mouth every 6 (six) hours as needed.   multivitamin tablet Take 1 tablet by mouth daily.   omeprazole 40 MG capsule Commonly known as: PRILOSEC Take 1 capsule (40 mg total) by mouth 2 (two) times daily before a meal.   VITAMIN B-12 PO Take 1 tablet by mouth daily.   zolpidem 12.5 MG CR tablet Commonly known as: AMBIEN CR Take 1 tablet (12.5 mg total) by mouth at bedtime as needed for sleep.        Allergies:  No Known Allergies  Family History: Family History  Problem Relation Age of Onset   Hypertension Mother    Diabetes Mother    Kidney disease Mother    Heart disease Mother    Colon cancer Father 88   Asthma Sister    Obesity Sister    Stroke Sister    Obesity Brother    Diabetes Other    Heart disease Other    Hypertension Other    Kidney disease Other    Cancer Paternal Uncle        unk type, possible prostate   Lung cancer Paternal Grandmother     Social History:  reports that he has never smoked. He has been exposed to tobacco smoke. He has never used  smokeless tobacco. He reports current alcohol use. He reports that he does not use drugs.   Physical Exam: BP (!) 165/112   Pulse 82   Ht '5\' 11"'$  (1.803 m)   Wt 180 lb (81.6 kg)   BMI 25.10 kg/m   Constitutional:  Alert and oriented, No acute distress. HEENT: [place] AT, moist mucus membranes.  Trachea midline, no masses. Cardiovascular: No clubbing, cyanosis, or edema. Respiratory: Normal respiratory effort, no increased work of breathing. GU:  No CVA tenderness.  No bladder fullness or masses. Foreskin easily retracted  Urethral meatus is patent.  No penile discharge. No penile lesions or rashes.  Small marble size left cutaneous rubbery nodule consistent with lipoma, extratesticular.  testicles  are located scrotally bilaterally. No masses are appreciated in the testicles. Left and right epididymis are normal.   Skin: No rashes, bruises or suspicious lesions. Neurologic: Grossly intact, no focal deficits, moving all 4 extremities. Psychiatric: Normal mood and affect.  Laboratory Data: Lab Results  Component Value Date   CREATININE 1.01 07/08/2022   Results for orders placed or performed in visit on 07/12/22  Microscopic Examination   Urine  Result Value Ref Range   WBC, UA 0-5 0 - 5 /hpf   RBC, Urine 11-30 (A) 0 - 2 /hpf   Epithelial Cells (non renal) 0-10 0 - 10 /hpf   Bacteria, UA Few None seen/Few  Urinalysis, Complete  Result Value Ref Range   Specific Gravity, UA 1.020 1.005 - 1.030   pH, UA 5.5 5.0 - 7.5   Color, UA Yellow Yellow   Appearance Ur Clear Clear   Leukocytes,UA Negative Negative   Protein,UA Trace (A) Negative/Trace   Glucose, UA Negative Negative   Ketones, UA Negative Negative   RBC, UA 2+ (A) Negative   Bilirubin, UA Negative Negative   Urobilinogen, Ur 0.2 0.2 - 1.0 mg/dL   Nitrite, UA Negative Negative   Microscopic Examination See below:       Assessment & Plan:    History of priapism  - Discussed strategies early intervention and PO sudafed  which he can use as needed.  -Priapism precautions reviewed - He has not engaged in sexual activity yet will hold off on prescribing PDE5 inhibitors, discussed the possibility of development of erectile dysfunction, will treat as needed if necessary  2. Lipoma  - Small on exam; no further intervention.   3. Microscopic hematuria  - 11-30 RBCs on UA today  - We discussed the differential diagnosis for microscopic hematuria via telephone call today including nephrolithiasis, renal or upper tract tumors, bladder stones, UTIs, or bladder tumors as well as undetermined etiologies. I did recommend CT abdomen and pelvis with oral contrast and office cystoscopy. - He is scheduled to undergo a CT abdomen and pelvis tomorrow. Discussed with Dr Marius Ditch as well as radiology to change CT scan to hematuria protocol and add p.o. contrast in order to decrease the number of studies he will need in the future -Plan for cystoscopy  Follow-up CT urogram/cystoscopy  Conley Rolls as a scribe for Hollice Espy, MD.,have documented all relevant documentation on the behalf of Hollice Espy, MD,as directed by  Hollice Espy, MD while in the presence of Hollice Espy, MD.  I have reviewed the above documentation for accuracy and completeness, and I agree with the above.   Hollice Espy, MD    [facility] [address], [place], [place] [address] [phone]

## 2022-07-13 ENCOUNTER — Ambulatory Visit: Admission: RE | Admit: 2022-07-13 | Payer: 59 | Source: Ambulatory Visit

## 2022-07-13 LAB — CALPROTECTIN, FECAL: Calprotectin, Fecal: 37 ug/g (ref 0–120)

## 2022-07-14 ENCOUNTER — Encounter: Payer: Self-pay | Admitting: Specialist

## 2022-07-14 ENCOUNTER — Encounter: Payer: Self-pay | Admitting: Gastroenterology

## 2022-07-14 ENCOUNTER — Ambulatory Visit: Payer: 59 | Admitting: Specialist

## 2022-07-14 VITALS — BP 178/90 | HR 71 | Ht 71.0 in | Wt 180.0 lb

## 2022-07-14 DIAGNOSIS — M4317 Spondylolisthesis, lumbosacral region: Secondary | ICD-10-CM | POA: Diagnosis not present

## 2022-07-14 DIAGNOSIS — M4310 Spondylolisthesis, site unspecified: Secondary | ICD-10-CM

## 2022-07-14 DIAGNOSIS — M4306 Spondylolysis, lumbar region: Secondary | ICD-10-CM

## 2022-07-14 LAB — GI PROFILE, STOOL, PCR

## 2022-07-14 NOTE — Progress Notes (Signed)
Office Visit Note   Patient: Harold Smith           Date of Birth: 1966-01-10           MRN: [id number] Visit Date: 07/14/2022              Requested by: [name], MD [place],  [place] [address] PCP: [name], MD   Assessment & Plan: Visit Diagnoses:  1. Lumbar spondylolysis   2. Spondylolisthesis, grade 2   3. Spondylolisthesis, lumbosacral region   4. Spondylolysis, lumbar region     Plan: Avoid prolong standing and walking. Order for a new walker with wheels. Surgery scheduling secretary Kandice Hams, will call you in the next week to schedule for surgery.  Surgery recommended is a two level lumbar fusion L5-S1 and S1-S2 this would be done with rods, screws and cages with local bone graft and allograft (donor bone graft). Take hydrocodone for for pain. Risk of surgery includes risk of infection 1 in 200 patients, bleeding 1/2% chance you would need a transfusion.   Risk to the nerves is one in 10,000. You will need to use a brace for 3 months and wean from the brace on the 4th month. Expect improved walking and standing tolerance. Expect relief of leg pain but numbness may persist depending on the length and degree of pressure that has been present.  Avoid bending, stooping and avoid lifting weights greater than 10 lbs. Avoid prolong standing and walking. Order for a new walker with wheels. Surgery scheduling secretary Kandice Hams, will call you in the next week to schedule for surgery.  Surgery recommended is a two level lumbar fusion L5-S1 and S1-S2 this would be done with rods, screws and cages with local bone graft and allograft (donor bone graft). Take hydrocodone for for pain.  Follow-Up Instructions: No follow-ups on file.   Orders:  No orders of the defined types were placed in this encounter.  No orders of the defined types were placed in this encounter.     Procedures: No procedures performed   Clinical  Data: No additional findings.   Subjective: Chief Complaint  Patient presents with   Lower Back - Pain, Follow-up    56 year old male with spondylolisthesis L5-S1 with     Review of Systems  All other systems reviewed and are negative.    Objective: Vital Signs: BP (!) 178/90 (BP Location: Left Arm, Patient Position: Sitting)   Pulse 71   Ht '5\' 11"'$  (1.803 m)   Wt 180 lb (81.6 kg)   BMI 25.10 kg/m   Physical Exam Constitutional:      Appearance: He is well-developed.  HENT:     Head: Normocephalic and atraumatic.  Eyes:     Pupils: Pupils are equal, round, and reactive to light.  Pulmonary:     Effort: Pulmonary effort is normal.     Breath sounds: Normal breath sounds.  Abdominal:     General: Bowel sounds are normal.     Palpations: Abdomen is soft.  Musculoskeletal:     Cervical back: Normal range of motion and neck supple.  Skin:    General: Skin is warm and dry.  Neurological:     Mental Status: He is alert and oriented to person, place, and time.  Psychiatric:        Behavior: Behavior normal.        Thought Content: Thought content normal.  Judgment: Judgment normal.     Back Exam   Tenderness  The patient is experiencing tenderness in the lumbar.  Range of Motion  Extension:  abnormal  Flexion:  normal  Lateral bend right:  normal  Lateral bend left:  normal  Rotation right:  normal  Rotation left:  normal   Muscle Strength  Right Quadriceps:  5/5  Left Quadriceps:  5/5  Right Hamstrings:  5/5  Left Hamstrings:  5/5       Specialty Comments:  No specialty comments available.  Imaging: No results found.   PMFS History: Patient Active Problem List   Diagnosis Date Noted   Mood disorder ([place]) 07/08/2022   Erosive esophagitis    Disability of walking 06/01/2022   Pain in joint involving ankle and foot 06/01/2022   Degeneration of lumbar intervertebral disc 06/01/2022   Iron deficiency anemia 05/10/2022   Microcytic  anemia 05/04/2022   Numbness and tingling 04/18/2022   Imbalance 04/18/2022   Difficulty sleeping 04/18/2022   Lumbar spondylolysis 02/25/2022   Influenza A 10/26/2021   Sleep disturbance 07/02/2021   Elevated blood pressure reading 10/14/2019   Obstructive sleep apnea 05/28/2019   Preventative health care 06/23/2018   Personal hx of gastric bypass 06/23/2018   Erectile dysfunction 06/23/2018   History of colonic polyps    Rectal bleeding 12/22/2015   Diverticulosis large intestine w/o perforation or abscess w/bleeding    Colon polyps    Past Medical History:  Diagnosis Date   Allergic rhinitis due to pollen    Anxiety    Colon polyps    Diverticulitis large intestine    Obesity    Osteoarthritis, knee    Sleep apnea    does not use cpap   Sleep disturbance     Family History  Problem Relation Age of Onset   Hypertension Mother    Diabetes Mother    Kidney disease Mother    Heart disease Mother    Colon cancer Father 22   Asthma Sister    Obesity Sister    Stroke Sister    Obesity Brother    Diabetes Other    Heart disease Other    Hypertension Other    Kidney disease Other    Cancer Paternal Uncle        unk type, possible prostate   Lung cancer Paternal Grandmother     Past Surgical History:  Procedure Laterality Date   ACHILLES TENDON REPAIR Right 12/26/2006   CATARACT EXTRACTION W/PHACO Left 07/20/2021   Procedure: CATARACT EXTRACTION PHACO AND INTRAOCULAR LENS PLACEMENT ([place]) LEFT 2.11 00:24.0;  Surgeon: Birder Robson, MD;  Location: [place];  Service: Ophthalmology;  Laterality: Left;  sleep apnea   CATARACT EXTRACTION W/PHACO Right 08/03/2021   Procedure: CATARACT EXTRACTION PHACO AND INTRAOCULAR LENS PLACEMENT ([facility]) RIGHT;  Surgeon: Birder Robson, MD;  Location: [place];  Service: Ophthalmology;  Laterality: Right;  3.19 0:33.0   CHOLECYSTECTOMY     COLON RESECTION Left 12/26/2004   due to diverticular disease at [place]  2013?   COLONOSCOPY WITH PROPOFOL N/A 05/22/2018   Procedure: COLONOSCOPY WITH PROPOFOL;  Surgeon: Lin Landsman, MD;  Location: [facility] ENDOSCOPY;  Service: Gastroenterology;  Laterality: N/A;   COLONOSCOPY WITH PROPOFOL N/A 10/22/2020   Procedure: COLONOSCOPY WITH PROPOFOL;  Surgeon: Lin Landsman, MD;  Location: [place];  Service: Endoscopy;  Laterality: N/A;  priority 4   COLONOSCOPY WITH PROPOFOL N/A 11/10/2021   Procedure: COLONOSCOPY  WITH PROPOFOL;  Surgeon: Lin Landsman, MD;  Location[facility] ENDOSCOPY;  Service: Gastroenterology;  Laterality: N/A;   COLONOSCOPY WITH PROPOFOL N/A 06/16/2022   Procedure: COLONOSCOPY WITH PROPOFOL;  Surgeon: Lin Landsman, MD;  Location: [place];  Service: Endoscopy;  Laterality: N/A;   DRUG INDUCED ENDOSCOPY N/A 06/10/2019   Procedure: DRUG INDUCED SLEEP ENDOSCOPY;  Surgeon: Jerrell Belfast, MD;  Location: [place];  Service: ENT;  Laterality: N/A;   ESOPHAGOGASTRODUODENOSCOPY (EGD) WITH PROPOFOL N/A 06/16/2022   Procedure: ESOPHAGOGASTRODUODENOSCOPY (EGD) WITH PROPOFOL;  Surgeon: Lin Landsman, MD;  Location: [place];  Service: Endoscopy;  Laterality: N/A;   EYE SURGERY     GASTRIC BYPASS N/A 12/27/2007   HIATAL HERNIA REPAIR  09/25/2014   at [place]   POLYPECTOMY  10/22/2020   Procedure: POLYPECTOMY;  Surgeon: Lin Landsman, MD;  Location: [place];  Service: Endoscopy;;   POLYPECTOMY  06/16/2022   Procedure: POLYPECTOMY;  Surgeon: Lin Landsman, MD;  Location: [place];  Service: Endoscopy;;   ROTATOR CUFF REPAIR     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

## 2022-07-15 LAB — PANCREATIC ELASTASE, FECAL: Pancreatic Elastase, Fecal: 101 ug Elast./g — ABNORMAL LOW (ref >200)

## 2022-07-15 MED ORDER — AZITHROMYCIN 500 MG PO TABS: 500.0000 mg | ORAL_TABLET | Freq: Every day | ORAL | 0 refills | Status: AC

## 2022-07-15 MED ORDER — PANCRELIPASE (LIP-PROT-AMYL) 36000-114000 UNITS PO CPEP: ORAL_CAPSULE | ORAL | 5 refills | Status: DC

## 2022-07-15 NOTE — Addendum Note (Signed)
Addended by: Lurlean Nanny on: 07/15/2022 10:39 AM   Modules accepted: Orders

## 2022-07-15 NOTE — Progress Notes (Signed)
c 

## 2022-07-18 ENCOUNTER — Telehealth: Payer: Self-pay

## 2022-07-18 NOTE — Telephone Encounter (Signed)
Patient verablized understanding of results. States he already has taken the Antibiotic and is taking the creon

## 2022-07-18 NOTE — Telephone Encounter (Signed)
-----   Message from Lin Landsman, MD sent at 07/15/2022 10:01 AM EDT ----- Threasa Beards  Please inform patient that the stool study results came back positive for E. coli infection which can explain his symptoms of diarrhea and lower abdominal pain.  Recommend to treat with azithromycin 500 mg once daily for 3 days.  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  Recommend to start Creon 36 K or Zenpep 40 K 2 capsules with the first bite of each meal and 1 with snack whichever one is covered by his insurance  Please arrange a follow-up to see me in 3 months  [place]

## 2022-07-21 ENCOUNTER — Ambulatory Visit
Admission: RE | Admit: 2022-07-21 | Discharge: 2022-07-21 | Disposition: A | Discharge status: Home / Self Care | Payer: 59 | Source: Ambulatory Visit | Attending: Urology | Admitting: Urology

## 2022-07-21 DIAGNOSIS — R319 Hematuria, unspecified: Secondary | ICD-10-CM | POA: Insufficient documentation

## 2022-07-21 MED ORDER — IOHEXOL 300 MG/ML  SOLN
100.0000 mL | Freq: Once | INTRAMUSCULAR | Status: AC | PRN
  Administered 2022-07-21: 100 mL via INTRAVENOUS

## 2022-07-27 ENCOUNTER — Ambulatory Visit (INDEPENDENT_AMBULATORY_CARE_PROVIDER_SITE_OTHER): Payer: 59 | Admitting: Urology

## 2022-07-27 ENCOUNTER — Encounter: Payer: Self-pay | Admitting: Oncology

## 2022-07-27 VITALS — BP 130/80 | HR 75 | Ht 71.0 in | Wt 180.0 lb

## 2022-07-27 DIAGNOSIS — N4289 Other specified disorders of prostate: Secondary | ICD-10-CM

## 2022-07-27 DIAGNOSIS — R319 Hematuria, unspecified: Secondary | ICD-10-CM

## 2022-07-27 NOTE — Progress Notes (Signed)
    07/27/22 CC:  Chief Complaint  Patient presents with   Cysto      HPI: Harold Smith is a 56 y.o. male with a personal history of priapism, lipoma, and microscopic hematuria.   He was seen in the ED on 05/28/2022 for priapism. Dorsal penile nerve block was performed patient was then given injection of phenylephrine intracavernosal he and 40 cc of dark blood was aspirated.  There was some improvement but no complete detumescence. A total of 210 cc was aspirated and he was given 600 mcg of intracavernosal phenylephrine.    No personal history of priapism.  He since stopped taking trazodone completely.   Recent UA on 07/12/2022 was positive for microscopic hematuria with 11- 30 RBCs   He underwent CT hematuria work-up on 07/21/2022 that visualized no radiographic evidence of urinary tract neoplasm, urolithiasis, or hydronephrosis.   Vitals:   07/27/22 1518  BP: 130/80  Pulse: 75   NED. A&Ox3.   No respiratory distress   Abd soft, NT, ND Normal phallus with bilateral descended testicles  Cystoscopy Procedure Note  Patient identification was confirmed, informed consent was obtained, and patient was prepped using Betadine solution.  Lidocaine jelly was administered per urethral meatus.     Pre-Procedure: - Inspection reveals a normal caliber ureteral meatus.  Procedure: The flexible cystoscope was introduced without difficulty - No urethral strictures/lesions are present. - Enlarged prostate  - Normal bladder neck - Bilateral ureteral orifices identified - Bladder mucosa  reveals no ulcers, tumors, or lesions - No bladder stones - No trabeculation  Retroflexion shows significant intravesical protrusion of prostate    Post-Procedure: - Patient tolerated the procedure well   Assessment/ Plan: Microscopic hematuria  - Cystoscopy shows significant intravesical protrusion of prostate was reassuring - CTU was reassuring   2. Prostatomegaly  - Asymptomatic   - return as needed with interval development of urinary issues -may be source of microscopic blood   F/u prn  I,Kailey Littlejohn,acting as a scribe for Hollice Espy, MD.,have documented all relevant documentation on the behalf of Hollice Espy, MD,as directed by  Hollice Espy, MD while in the presence of Hollice Espy, MD.  ,I have reviewed the above documentation for accuracy and completeness, and I agree with the above.   Hollice Espy, MD

## 2022-08-02 ENCOUNTER — Encounter: Payer: Self-pay | Admitting: Oncology

## 2022-08-05 ENCOUNTER — Inpatient Hospital Stay: Payer: 59 | Attending: Oncology

## 2022-08-05 ENCOUNTER — Other Ambulatory Visit: Payer: Self-pay

## 2022-08-05 ENCOUNTER — Encounter: Payer: Self-pay | Admitting: Oncology

## 2022-08-05 DIAGNOSIS — D508 Other iron deficiency anemias: Secondary | ICD-10-CM | POA: Diagnosis not present

## 2022-08-05 LAB — CBC WITH DIFFERENTIAL/PLATELET
Abs Immature Granulocytes: 0.02 10*3/uL (ref 0.00–0.07)
Basophils Absolute: 0.1 10*3/uL (ref 0.0–0.1)
Basophils Relative: 1 %
Eosinophils Absolute: 0.1 10*3/uL (ref 0.0–0.5)
Eosinophils Relative: 2 %
HCT: 35.5 % — ABNORMAL LOW (ref 39.0–52.0)
Hemoglobin: 11.5 g/dL — ABNORMAL LOW (ref 13.0–17.0)
Immature Granulocytes: 0 %
Lymphocytes Relative: 22 %
Lymphs Abs: 1.5 10*3/uL (ref 0.7–4.0)
MCH: 27.8 pg (ref 26.0–34.0)
MCHC: 32.4 g/dL (ref 30.0–36.0)
MCV: 86 fL (ref 80.0–100.0)
Monocytes Absolute: 0.7 10*3/uL (ref 0.1–1.0)
Monocytes Relative: 11 %
Neutro Abs: 4.4 10*3/uL (ref 1.7–7.7)
Neutrophils Relative %: 64 %
Platelets: 283 10*3/uL (ref 150–400)
RBC: 4.13 MIL/uL — ABNORMAL LOW (ref 4.22–5.81)
RDW: 24 % — ABNORMAL HIGH (ref 11.5–15.5)
WBC: 6.8 10*3/uL (ref 4.0–10.5)
nRBC: 0 % (ref 0.0–0.2)

## 2022-08-05 LAB — IRON AND TIBC
Iron: 38 ug/dL — ABNORMAL LOW (ref 45–182)
Saturation Ratios: 8 % — ABNORMAL LOW (ref 17.9–39.5)
TIBC: 475 ug/dL — ABNORMAL HIGH (ref 250–450)
UIBC: 437 ug/dL

## 2022-08-05 LAB — FERRITIN: Ferritin: 6 ng/mL — ABNORMAL LOW (ref 24–336)

## 2022-08-08 ENCOUNTER — Inpatient Hospital Stay (HOSPITAL_BASED_OUTPATIENT_CLINIC_OR_DEPARTMENT_OTHER): Payer: 59 | Admitting: Oncology

## 2022-08-08 ENCOUNTER — Encounter: Payer: Self-pay | Admitting: Oncology

## 2022-08-08 ENCOUNTER — Inpatient Hospital Stay: Payer: 59

## 2022-08-08 VITALS — BP 158/104 | HR 76 | Temp 99.9°F | Resp 18 | Wt 179.4 lb

## 2022-08-08 VITALS — BP 171/102 | HR 67 | Resp 16

## 2022-08-08 DIAGNOSIS — D508 Other iron deficiency anemias: Secondary | ICD-10-CM

## 2022-08-08 MED ORDER — SODIUM CHLORIDE 0.9 % IV SOLN
200.0000 mg | Freq: Once | INTRAVENOUS | Status: AC
  Administered 2022-08-08: 200 mg via INTRAVENOUS
  Filled 2022-08-08: qty 200

## 2022-08-08 MED ORDER — SODIUM CHLORIDE 0.9 % IV SOLN
Freq: Once | INTRAVENOUS | Status: AC
  Filled 2022-08-08: qty 250

## 2022-08-08 NOTE — Progress Notes (Signed)
BP 158/104 in clinic. Rechecked BP after patient was settled for approx. 10 minutes and BP is still 158/101. Updated Dr. Tasia Catchings and team. Per Dr. Tasia Catchings, okay to proceed with treatment.   Patient has completed Venofer treatment. BP 171/102. Patient denies any s/s at this time. Dr. Tasia Catchings made aware. Patient encouraged to contact PCP and educated on s/s as to when to seek emergency care. Patient verbalizes understanding and denies any further questions or concerns.   Report handed off to Gust Rung, RN for further patient care.

## 2022-08-08 NOTE — Progress Notes (Signed)
Ok per Dr. Tasia Catchings to d/c pt home with bp 171/102. Pt denies s/s.

## 2022-08-08 NOTE — Progress Notes (Signed)
Patient here for follow up.Pt's blood pressure is elevated, no signs or symptoms of headache, lightheadedness or pain.

## 2022-08-08 NOTE — Progress Notes (Signed)
Hematology/Oncology Progress note [phone] [phone]            Patient Care Team: [name], MD as PCP - General (Internal Medicine) [name], MD as Referring Physician (Family Medicine) Christene Lye, MD as Consulting Physician (General Surgery)  REFERRING PROVIDER: [name], MD  CHIEF COMPLAINTS/REASON FOR VISIT:  Follow-up for iron deficiency anemia.  HISTORY OF PRESENTING ILLNESS:   Harold Smith is a  56 y.o.  male with PMH listed below was seen in consultation at the request of  Viviana Simpler I, MD  for evaluation of anemia  05/03/2022, patient had preop blood work done showed hemoglobin of 8.3, hematocrit 75.  Platelet count 258, white count 8.5.  Patient had plan of back surgery which was aborted due to decreased hemoglobin. Reviewed his previous lab records 01/30/2022, hemoglobin 10, MCV 88.6, 07/02/2021, hemoglobin 11, MCV 90.2. Patient denies any black or bloody stool.  Denies any unintentional weight loss, night sweats, fever, epigastric discomfort. Patient takes Mobic as needed for pain. 11/10/2021, colonoscopy showed 2 small polyps in the proximal transverse colon.  Pathology showed tubular adenoma. Family history is positive for father who passed away from colorectal cancer at age of 41.  INTERVAL HISTORY Harold Smith is a 56 y.o. male who has above history reviewed by me today presents for follow up visit for iron deficiency anemia. Patient has received IV Venofer treatments and chronic fatigue level has improved. He continues to have chronic back pain.  He follows up with orthopedic surgeon and there is plan for back surgery . Review of Systems  Constitutional:  Positive for fatigue. Negative for appetite change, chills, fever and unexpected weight change.  HENT:   Negative for hearing loss and voice change.   Eyes:  Negative for eye problems and icterus.  Respiratory:  Negative for chest  tightness, cough and shortness of breath.   Cardiovascular:  Negative for chest pain and leg swelling.  Gastrointestinal:  Negative for abdominal distention and abdominal pain.  Endocrine: Negative for hot flashes.  Genitourinary:  Negative for difficulty urinating, dysuria and frequency.   Musculoskeletal:  Positive for back pain. Negative for arthralgias.  Skin:  Negative for itching and rash.  Neurological:  Negative for light-headedness and numbness.  Hematological:  Negative for adenopathy. Does not bruise/bleed easily.  Psychiatric/Behavioral:  Negative for confusion.     MEDICAL HISTORY:  Past Medical History:  Diagnosis Date   Allergic rhinitis due to pollen    Anxiety    Colon polyps    Diverticulitis large intestine    Obesity    Osteoarthritis, knee    Sleep apnea    does not use cpap   Sleep disturbance     SURGICAL HISTORY: Past Surgical History:  Procedure Laterality Date   ACHILLES TENDON REPAIR Right 12/26/2006   CATARACT EXTRACTION W/PHACO Left 07/20/2021   Procedure: CATARACT EXTRACTION PHACO AND INTRAOCULAR LENS PLACEMENT ([place]) LEFT 2.11 00:24.0;  Surgeon: Birder Robson, MD;  Location: [place];  Service: Ophthalmology;  Laterality: Left;  sleep apnea   CATARACT EXTRACTION W/PHACO Right 08/03/2021   Procedure: CATARACT EXTRACTION PHACO AND INTRAOCULAR LENS PLACEMENT ([facility]) RIGHT;  Surgeon: Birder Robson, MD;  Location: [place];  Service: Ophthalmology;  Laterality: Right;  3.19 0:33.0   CHOLECYSTECTOMY     COLON RESECTION Left 12/26/2004   due to diverticular disease at [place]  2013?   COLONOSCOPY WITH PROPOFOL N/A 05/22/2018   Procedure: COLONOSCOPY  WITH PROPOFOL;  Surgeon: Lin Landsman, MD;  Location: [facility] ENDOSCOPY;  Service: Gastroenterology;  Laterality: N/A;   COLONOSCOPY WITH PROPOFOL N/A 10/22/2020   Procedure: COLONOSCOPY WITH PROPOFOL;  Surgeon: Lin Landsman, MD;  Location: [place];  Service: Endoscopy;  Laterality: N/A;  priority 4   COLONOSCOPY WITH PROPOFOL N/A 11/10/2021   Procedure: COLONOSCOPY WITH PROPOFOL;  Surgeon: Lin Landsman, MD;  Location: [facility] ENDOSCOPY;  Service: Gastroenterology;  Laterality: N/A;   COLONOSCOPY WITH PROPOFOL N/A 06/16/2022   Procedure: COLONOSCOPY WITH PROPOFOL;  Surgeon: Lin Landsman, MD;  Location: [place];  Service: Endoscopy;  Laterality: N/A;   DRUG INDUCED ENDOSCOPY N/A 06/10/2019   Procedure: DRUG INDUCED SLEEP ENDOSCOPY;  Surgeon: Jerrell Belfast, MD;  Location: [place];  Service: ENT;  Laterality: N/A;   ESOPHAGOGASTRODUODENOSCOPY (EGD) WITH PROPOFOL N/A 06/16/2022   Procedure: ESOPHAGOGASTRODUODENOSCOPY (EGD) WITH PROPOFOL;  Surgeon: Lin Landsman, MD;  Location: [place];  Service: Endoscopy;  Laterality: N/A;   EYE SURGERY     GASTRIC BYPASS N/A 12/27/2007   HIATAL HERNIA REPAIR  09/25/2014   at [place]   POLYPECTOMY  10/22/2020   Procedure: POLYPECTOMY;  Surgeon: Lin Landsman, MD;  Location: [place];  Service: Endoscopy;;   POLYPECTOMY  06/16/2022   Procedure: POLYPECTOMY;  Surgeon: Lin Landsman, MD;  Location: [place];  Service: Endoscopy;;   ROTATOR CUFF REPAIR     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    SOCIAL HISTORY: Social History   Socioeconomic History   Marital status: Single    Spouse name: Not on file   Number of children: 3   Years of education: Not on file   Highest education level: Not on file  Occupational History   Occupation: Commercial truck driver  Tobacco Use   Smoking status: Never    Passive exposure: Past   Smokeless tobacco: Never   Tobacco comments:    occasional cigar  Vaping Use   Vaping Use: Never used  Substance and Sexual Activity   Alcohol use: Yes    Alcohol/week: 0.0 standard drinks of alcohol    Comment: occasional   Drug use: No   Sexual activity: Yes  Other  Topics Concern   Not on file  Social History Narrative   Lives with 2 daughters from 74st marriage   Shared custody of another daughter from 2nd marriage   Social Determinants of Health   Financial Resource Strain: Not on file  Food Insecurity: Not on file  Transportation Needs: Not on file  Physical Activity: Not on file  Stress: Not on file  Social Connections: Not on file  Intimate Partner Violence: Not on file    FAMILY HISTORY: Family History  Problem Relation Age of Onset   Hypertension Mother    Diabetes Mother    Kidney disease Mother    Heart disease Mother    Colon cancer Father 32   Asthma Sister    Obesity Sister    Stroke Sister    Obesity Brother    Diabetes Other    Heart disease Other    Hypertension Other    Kidney disease Other    Cancer Paternal Uncle        unk type, possible prostate   Lung cancer Paternal Grandmother     ALLERGIES:  has No Known Allergies.  MEDICATIONS:  Current Outpatient Medications  Medication Sig Dispense Refill   cetirizine (ZYRTEC) 10  MG tablet Take 10 mg by mouth daily.     Cyanocobalamin (VITAMIN B-12 PO) Take 1 tablet by mouth daily.     cyclobenzaprine (FLEXERIL) 10 MG tablet Take 10 mg by mouth at bedtime.     FEROSUL 325 (65 Fe) MG tablet TAKE 1 TABLET BY MOUTH DAILY WITH BREAKFAST 30 tablet 11   fluticasone (FLONASE) 50 MCG/ACT nasal spray Place 2 sprays into both nostrils daily. 48 g 3   gabapentin (NEURONTIN) 300 MG capsule Take 1 capsule (300 mg total) by mouth 2 (two) times daily. Plus 4 at bedtime (Patient taking differently: Take 1,200 mg by mouth at bedtime.) 180 capsule 1   lipase/protease/amylase (CREON) 36000 UNITS CPEP capsule Take 2 capsules (72,000 Units total) by mouth 3 (three) times daily with meals. May also take 1 capsule (36,000 Units total) as needed (with snacks). 240 capsule 5   meloxicam (MOBIC) 15 MG tablet Take 1 tablet (15 mg total) by mouth daily as needed for pain. 90 tablet 3    methocarbamol (ROBAXIN) 500 MG tablet Take 500 mg by mouth every 6 (six) hours as needed.     Multiple Vitamin (MULTIVITAMIN) tablet Take 1 tablet by mouth daily.     omeprazole (PRILOSEC) 40 MG capsule Take 1 capsule (40 mg total) by mouth 2 (two) times daily before a meal. 60 capsule 2   zolpidem (AMBIEN CR) 12.5 MG CR tablet Take 1 tablet (12.5 mg total) by mouth at bedtime as needed for sleep. 30 tablet 0   No current facility-administered medications for this visit.     PHYSICAL EXAMINATION: ECOG PERFORMANCE STATUS: 1 - Symptomatic but completely ambulatory Vitals:   08/08/22 1322  BP: (!) 158/104  Pulse: 76  Resp: 18  Temp: 99.9 F (37.7 C)   Filed Weights   08/08/22 1322  Weight: 179 lb 6.4 oz (81.4 kg)    Physical Exam Constitutional:      Comments: Mild acute stress due to back pain  HENT:     Head: Normocephalic and atraumatic.  Eyes:     General: No scleral icterus. Cardiovascular:     Rate and Rhythm: Normal rate and regular rhythm.     Heart sounds: Normal heart sounds.  Pulmonary:     Effort: Pulmonary effort is normal. No respiratory distress.     Breath sounds: No wheezing.  Abdominal:     General: Bowel sounds are normal. There is no distension.     Palpations: Abdomen is soft.  Musculoskeletal:        General: No deformity. Normal range of motion.     Cervical back: Normal range of motion and neck supple.  Skin:    General: Skin is warm and dry.     Findings: No erythema or rash.  Neurological:     Mental Status: He is alert and oriented to person, place, and time. Mental status is at baseline.     Cranial Nerves: No cranial nerve deficit.     Coordination: Coordination normal.  Psychiatric:        Mood and Affect: Mood normal.     LABORATORY DATA:  I have reviewed the data as listed Lab Results  Component Value Date   WBC 6.8 08/05/2022   HGB 11.5 (L) 08/05/2022   HCT 35.5 (L) 08/05/2022   MCV 86.0 08/05/2022   PLT 283 08/05/2022    Recent Labs    05/10/22 1145 05/28/22 1930 07/08/22 1227  NA 138 139 140  K 4.2 3.2* 3.4*  CL 108 111 107  CO2 '23 25 26  '$ GLUCOSE 96 87 83  BUN '19 13 13  '$ CREATININE 0.84 0.79 1.01  CALCIUM 8.5* 8.3* 8.5  GFRNONAA >60 >60  --   PROT 7.3 6.9  --   ALBUMIN 3.8 3.4* 3.8  AST 19 16  --   ALT 23 20  --   ALKPHOS 92 83  --   BILITOT 0.6 0.7  --     Iron/TIBC/Ferritin/ %Sat    Component Value Date/Time   IRON 38 (L) 08/05/2022 1335   TIBC 475 (H) 08/05/2022 1335   FERRITIN 6 (L) 08/05/2022 1335   IRONPCTSAT 8 (L) 08/05/2022 1335      RADIOGRAPHIC STUDIES: I have personally reviewed the radiological images as listed and agreed with the findings in the report. CT HEMATURIA WORKUP  Result Date: 07/21/2022 CLINICAL DATA:  Microscopic hematuria. EXAM: CT ABDOMEN AND PELVIS WITHOUT AND WITH CONTRAST TECHNIQUE: Multidetector CT imaging of the abdomen and pelvis was performed following the standard protocol before and following the bolus administration of intravenous contrast. RADIATION DOSE REDUCTION: This exam was performed according to the departmental dose-optimization program which includes automated exposure control, adjustment of the mA and/or kV according to patient size and/or use of iterative reconstruction technique. CONTRAST:  127m OMNIPAQUE IOHEXOL 300 MG/ML  SOLN COMPARISON:  None Available. FINDINGS: Lower Chest: No acute findings. Hepatobiliary: No hepatic masses identified. A few tiny sub-cm cysts are seen in the liver dome. Prior cholecystectomy. No evidence of biliary obstruction. Pancreas:  No mass or inflammatory changes. Spleen: Within normal limits in size and appearance. Adrenals/Urinary Tract: No adrenal masses identified. No evidence of urolithiasis or hydronephrosis. A few tiny benign-appearing right renal cysts are noted (no followup imaging recommended) . No complex cystic or solid renal masses identified. No masses seen involving the collecting systems, ureters,  or bladder. Stomach/Bowel: Postop changes seen from previous gastric bypass surgery. Surgical anastomosis also noted in the sigmoid colon. No evidence of obstruction, inflammatory process or abnormal fluid collections. Vascular/Lymphatic: No pathologically enlarged lymph nodes. No acute vascular findings. Reproductive:  No mass or other significant abnormality. Other:  None. Musculoskeletal: No suspicious bone lesions identified. Previous L1 vertebroplasty noted. Bilateral L5 pars defects also seen, with degenerative disc disease and grade 1 anterolisthesis at L5-S1. IMPRESSION: No radiographic evidence of urinary tract neoplasm, urolithiasis, or hydronephrosis. Electronically Signed   By: JMarlaine HindM.D.   On: 07/21/2022 16:30      ASSESSMENT & PLAN:  1. Other iron deficiency anemia    #Iron deficiency anemia, previously tolerated IV Venofer treatments. Labs reviewed and discussed with patient Hemoglobin has improved to 11.5, iron saturation indicates persistent iron deficiency.  Saturation 8, ferritin 6. Recommend additional IV Venofer weekly x4 Patient agrees with the current dose   Orders Placed This Encounter  Procedures   CBC with Differential/Platelet    Standing Status:   Future    Standing Expiration Date:   08/09/2023   Iron and TIBC    Standing Status:   Future    Standing Expiration Date:   08/09/2023   Ferritin    Standing Status:   Future    Standing Expiration Date:   08/09/2023    All questions were answered. The patient knows to call the clinic with any problems questions or concerns.  cc LVenia Carbon MD    Return of visit: 6 months for repeat labs, MD evaluation of need of additional IV Venofer treatments. Thank you for this  kind referral and the opportunity to participate in the care of this patient. A copy of today's note is routed to referring provider   Earlie Server, MD, PhD [facility] Hematology Oncology 08/08/2022

## 2022-08-09 ENCOUNTER — Encounter: Payer: Self-pay | Admitting: Oncology

## 2022-08-15 ENCOUNTER — Encounter: Payer: Self-pay | Admitting: Urology

## 2022-08-15 ENCOUNTER — Inpatient Hospital Stay: Payer: 59

## 2022-08-15 VITALS — BP 154/95 | HR 72 | Temp 98.2°F | Resp 18

## 2022-08-15 DIAGNOSIS — D508 Other iron deficiency anemias: Secondary | ICD-10-CM

## 2022-08-15 MED ORDER — SODIUM CHLORIDE 0.9 % IV SOLN
200.0000 mg | Freq: Once | INTRAVENOUS | Status: AC
  Administered 2022-08-15: 200 mg via INTRAVENOUS
  Filled 2022-08-15: qty 200

## 2022-08-15 NOTE — Patient Instructions (Signed)
[facility]  Discharge Instructions: Thank you for choosing [facility] to provide your oncology and hematology care.  If you have a lab appointment with the Cancer Center, please go directly to the Cancer Center and check in at the registration area.  Wear comfortable clothing and clothing appropriate for easy access to any Portacath or PICC line.   We strive to give you quality time with your provider. You may need to reschedule your appointment if you arrive late (15 or more minutes).  Arriving late affects you and other patients whose appointments are after yours.  Also, if you miss three or more appointments without notifying the office, you may be dismissed from the clinic at the provider's discretion.      For prescription refill requests, have your pharmacy contact our office and allow 72 hours for refills to be completed.    Today you received the following chemotherapy and/or immunotherapy agents venofer      To help prevent nausea and vomiting after your treatment, we encourage you to take your nausea medication as directed.  BELOW ARE SYMPTOMS THAT SHOULD BE REPORTED IMMEDIATELY: *FEVER GREATER THAN 100.4 F (38 C) OR HIGHER *CHILLS OR SWEATING *NAUSEA AND VOMITING THAT IS NOT CONTROLLED WITH YOUR NAUSEA MEDICATION *UNUSUAL SHORTNESS OF BREATH *UNUSUAL BRUISING OR BLEEDING *URINARY PROBLEMS (pain or burning when urinating, or frequent urination) *BOWEL PROBLEMS (unusual diarrhea, constipation, pain near the anus) TENDERNESS IN MOUTH AND THROAT WITH OR WITHOUT PRESENCE OF ULCERS (sore throat, sores in mouth, or a toothache) UNUSUAL RASH, SWELLING OR PAIN  UNUSUAL VAGINAL DISCHARGE OR ITCHING   Items with * indicate a potential emergency and should be followed up as soon as possible or go to the Emergency Department if any problems should occur.  Please show the CHEMOTHERAPY ALERT CARD or IMMUNOTHERAPY ALERT CARD at check-in to the  Emergency Department and triage nurse.  Should you have questions after your visit or need to cancel or reschedule your appointment, please contact [facility]  [phone] and follow the prompts.  Office hours are 8:00 a.m. to 4:30 p.m. Monday - Friday. Please note that voicemails left after 4:00 p.m. may not be returned until the following business day.  We are closed weekends and major holidays. You have access to a nurse at all times for urgent questions. Please call the main number to the clinic [phone] and follow the prompts.  For any non-urgent questions, you may also contact your provider using MyChart. We now offer e-Visits for anyone 18 and older to request care online for non-urgent symptoms. For details visit [email or web address].   Also download the MyChart app! Go to the app store, search "MyChart", open the app, select [place], and log in with your MyChart username and password.  Masks are optional in the cancer centers. If you would like for your care team to wear a mask while they are taking care of you, please let them know. For doctor visits, patients may have with them one support person who is at least 58 years old. At this time, visitors are not allowed in the infusion area.  Iron Sucrose Injection What is this medication? IRON SUCROSE (EYE ern SOO krose) treats low levels of iron (iron deficiency anemia) in people with kidney disease. Iron is a mineral that plays an important role in making red blood cells, which carry oxygen from your lungs to the rest of your body. This medicine may be   used for other purposes; ask your health care provider or pharmacist if you have questions. COMMON BRAND NAME(S): Venofer What should I tell my care team before I take this medication? They need to know if you have any of these conditions: Anemia not caused by low iron levels Heart disease High levels of iron in the blood Kidney disease Liver  disease An unusual or allergic reaction to iron, other medications, foods, dyes, or preservatives Pregnant or trying to get pregnant Breast-feeding How should I use this medication? This medication is for infusion into a vein. It is given in a hospital or clinic setting. Talk to your care team about the use of this medication in children. While this medication may be prescribed for children as young as 2 years for selected conditions, precautions do apply. Overdosage: If you think you have taken too much of this medicine contact a poison control center or emergency room at once. NOTE: This medicine is only for you. Do not share this medicine with others. What if I miss a dose? It is important not to miss your dose. Call your care team if you are unable to keep an appointment. What may interact with this medication? Do not take this medication with any of the following: Deferoxamine Dimercaprol Other iron products This medication may also interact with the following: Chloramphenicol Deferasirox This list may not describe all possible interactions. Give your health care provider a list of all the medicines, herbs, non-prescription drugs, or dietary supplements you use. Also tell them if you smoke, drink alcohol, or use illegal drugs. Some items may interact with your medicine. What should I watch for while using this medication? Visit your care team regularly. Tell your care team if your symptoms do not start to get better or if they get worse. You may need blood work done while you are taking this medication. You may need to follow a special diet. Talk to your care team. Foods that contain iron include: whole grains/cereals, dried fruits, beans, or peas, leafy green vegetables, and organ meats (liver, kidney). What side effects may I notice from receiving this medication? Side effects that you should report to your care team as soon as possible: Allergic reactions--skin rash, itching, hives,  swelling of the face, lips, tongue, or throat Low blood pressure--dizziness, feeling faint or lightheaded, blurry vision Shortness of breath Side effects that usually do not require medical attention (report to your care team if they continue or are bothersome): Flushing Headache Joint pain Muscle pain Nausea Pain, redness, or irritation at injection site This list may not describe all possible side effects. Call your doctor for medical advice about side effects. You may report side effects to FDA at 1-800-FDA-1088. Where should I keep my medication? This medication is given in a hospital or clinic and will not be stored at home. NOTE: This sheet is a summary. It may not cover all possible information. If you have questions about this medicine, talk to your doctor, pharmacist, or health care provider.  2023 Elsevier/Gold Standard (2008-02-02 00:00:00)   

## 2022-08-16 MED ORDER — SILDENAFIL CITRATE 20 MG PO TABS: ORAL_TABLET | ORAL | 6 refills | Status: DC

## 2022-08-18 ENCOUNTER — Telehealth: Payer: Self-pay

## 2022-08-18 NOTE — Telephone Encounter (Signed)
[facility] has denied pts surgery due to no documentation that pt has had PT within the last 12 months. Your note states that he has but we did not have the actual PT notes to send them before they went ahead and issued the denial. Next step was a peer-to-peer. I set this up for 08/22/22. They don't do specific times just 2 hr window of time so it is set up for 2-4 and they will be calling your cell #. The denial letter is scanned into the chart.  Case [id number]

## 2022-08-22 ENCOUNTER — Inpatient Hospital Stay: Payer: 59

## 2022-08-22 VITALS — BP 161/91 | HR 57 | Temp 99.1°F | Resp 16

## 2022-08-22 DIAGNOSIS — D508 Other iron deficiency anemias: Secondary | ICD-10-CM | POA: Diagnosis not present

## 2022-08-22 MED ORDER — SODIUM CHLORIDE 0.9 % IV SOLN
200.0000 mg | Freq: Once | INTRAVENOUS | Status: AC
  Administered 2022-08-22: 200 mg via INTRAVENOUS
  Filled 2022-08-22: qty 200

## 2022-08-22 MED ORDER — SODIUM CHLORIDE 0.9 % IV SOLN
INTRAVENOUS | Status: DC | PRN
  Filled 2022-08-22: qty 250

## 2022-08-25 ENCOUNTER — Ambulatory Visit: Payer: 59 | Admitting: Surgery

## 2022-08-28 ENCOUNTER — Emergency Department: Payer: 59

## 2022-08-28 ENCOUNTER — Encounter: Payer: Self-pay | Admitting: Emergency Medicine

## 2022-08-28 ENCOUNTER — Other Ambulatory Visit: Payer: Self-pay

## 2022-08-28 ENCOUNTER — Emergency Department
Admission: EM | Admit: 2022-08-28 | Discharge: 2022-08-29 | Disposition: A | Discharge status: Other Acute Inpatient Hospital | Payer: 59 | Attending: Emergency Medicine | Admitting: Emergency Medicine

## 2022-08-28 DIAGNOSIS — M7989 Other specified soft tissue disorders: Secondary | ICD-10-CM | POA: Diagnosis not present

## 2022-08-28 DIAGNOSIS — S72351A Displaced comminuted fracture of shaft of right femur, initial encounter for closed fracture: Secondary | ICD-10-CM | POA: Diagnosis not present

## 2022-08-28 DIAGNOSIS — Z01818 Encounter for other preprocedural examination: Secondary | ICD-10-CM | POA: Diagnosis not present

## 2022-08-28 DIAGNOSIS — W108XXA Fall (on) (from) other stairs and steps, initial encounter: Secondary | ICD-10-CM | POA: Insufficient documentation

## 2022-08-28 DIAGNOSIS — S72491A Other fracture of lower end of right femur, initial encounter for closed fracture: Secondary | ICD-10-CM

## 2022-08-28 DIAGNOSIS — S79921A Unspecified injury of right thigh, initial encounter: Secondary | ICD-10-CM | POA: Diagnosis not present

## 2022-08-28 DIAGNOSIS — S72401A Unspecified fracture of lower end of right femur, initial encounter for closed fracture: Secondary | ICD-10-CM | POA: Diagnosis not present

## 2022-08-28 LAB — CBC WITH DIFFERENTIAL/PLATELET
Abs Immature Granulocytes: 0.04 10*3/uL (ref 0.00–0.07)
Basophils Absolute: 0 10*3/uL (ref 0.0–0.1)
Basophils Relative: 0 %
Eosinophils Absolute: 0.1 10*3/uL (ref 0.0–0.5)
Eosinophils Relative: 1 %
HCT: 37.6 % — ABNORMAL LOW (ref 39.0–52.0)
Hemoglobin: 12.3 g/dL — ABNORMAL LOW (ref 13.0–17.0)
Immature Granulocytes: 0 %
Lymphocytes Relative: 11 %
Lymphs Abs: 1.2 10*3/uL (ref 0.7–4.0)
MCH: 28.7 pg (ref 26.0–34.0)
MCHC: 32.7 g/dL (ref 30.0–36.0)
MCV: 87.6 fL (ref 80.0–100.0)
Monocytes Absolute: 1 10*3/uL (ref 0.1–1.0)
Monocytes Relative: 9 %
Neutro Abs: 8.6 10*3/uL — ABNORMAL HIGH (ref 1.7–7.7)
Neutrophils Relative %: 79 %
Platelets: 279 10*3/uL (ref 150–400)
RBC: 4.29 MIL/uL (ref 4.22–5.81)
RDW: 21.4 % — ABNORMAL HIGH (ref 11.5–15.5)
Smear Review: NORMAL
WBC: 10.9 10*3/uL — ABNORMAL HIGH (ref 4.0–10.5)
nRBC: 0 % (ref 0.0–0.2)

## 2022-08-28 LAB — COMPREHENSIVE METABOLIC PANEL
ALT: 34 U/L (ref 0–44)
AST: 23 U/L (ref 15–41)
Albumin: 3.8 g/dL (ref 3.5–5.0)
Alkaline Phosphatase: 88 U/L (ref 38–126)
Anion gap: 8 (ref 5–15)
BUN: 20 mg/dL (ref 6–20)
CO2: 22 mmol/L (ref 22–32)
Calcium: 8.4 mg/dL — ABNORMAL LOW (ref 8.9–10.3)
Chloride: 105 mmol/L (ref 98–111)
Creatinine, Ser: 0.65 mg/dL (ref 0.61–1.24)
GFR, Estimated: >60 mL/min (ref >60)
Glucose, Bld: 124 mg/dL — ABNORMAL HIGH (ref 70–99)
Potassium: 3.7 mmol/L (ref 3.5–5.1)
Sodium: 135 mmol/L (ref 135–145)
Total Bilirubin: 0.9 mg/dL (ref 0.3–1.2)
Total Protein: 7.2 g/dL (ref 6.5–8.1)

## 2022-08-28 MED ORDER — HYDROMORPHONE HCL 1 MG/ML IJ SOLN
1.0000 mg | Freq: Once | INTRAMUSCULAR | Status: AC
  Administered 2022-08-28: 1 mg via INTRAVENOUS
  Filled 2022-08-28: qty 1

## 2022-08-28 MED ORDER — ONDANSETRON HCL 4 MG/2ML IJ SOLN
4.0000 mg | Freq: Once | INTRAMUSCULAR | Status: AC
  Administered 2022-08-28: 4 mg via INTRAVENOUS
  Filled 2022-08-28: qty 2

## 2022-08-28 MED ORDER — FENTANYL CITRATE PF 50 MCG/ML IJ SOSY
100.0000 ug | PREFILLED_SYRINGE | Freq: Once | INTRAMUSCULAR | Status: AC
  Administered 2022-08-28: 100 ug via INTRAVENOUS
  Filled 2022-08-28: qty 2

## 2022-08-28 MED ORDER — HYDROMORPHONE HCL 1 MG/ML IJ SOLN
0.5000 mg | Freq: Once | INTRAMUSCULAR | Status: AC
  Administered 2022-08-28: 0.5 mg via INTRAVENOUS
  Filled 2022-08-28: qty 0.5

## 2022-08-28 MED ORDER — HYDROMORPHONE HCL 1 MG/ML IJ SOLN
1.0000 mg | INTRAMUSCULAR | Status: DC | PRN
  Administered 2022-08-29: 1 mg via INTRAVENOUS
  Filled 2022-08-28: qty 1

## 2022-08-28 MED ORDER — SODIUM CHLORIDE 0.9 % IV BOLUS
1000.0000 mL | Freq: Once | INTRAVENOUS | Status: AC
  Administered 2022-08-28: 1000 mL via INTRAVENOUS

## 2022-08-28 NOTE — ED Provider Notes (Signed)
[facility] Provider Note  Patient Contact: 4:24 PM (approximate)   History   Knee Pain   HPI  Harold Smith is a 55 y.o. male who presents the emergency department complaining of right knee pain.  Patient was stepping down the step from his house to his garage when he felt and heard a loud pop.  Patient states that his knee gave way underneath him, and he fell.  He has not sustained any other injury, has no other musculoskeletal pain.  Patient states that he has not been able to straighten his knee, cannot bear any weight at this time.  Patient with no history of previous knee injury to this knee.  Patient states that his right knee is currently 3 times the size is of normal.  No medications prior to arrival.     Physical Exam   Triage Vital Signs: ED Triage Vitals  Enc Vitals Group     BP 08/28/22 1513 (!) 169/110     Pulse Rate 08/28/22 1513 83     Resp 08/28/22 1513 19     Temp 08/28/22 1513 98.5 F (36.9 C)     Temp Source 08/28/22 1513 Oral     SpO2 08/28/22 1513 99 %     Weight 08/28/22 1511 180 lb 12.4 oz (82 kg)     Height 08/28/22 1511 '5\' 11"'$  (1.803 m)     Head Circumference --      Peak Flow --      Pain Score 08/28/22 1510 9     Pain Loc --      Pain Edu? --      Excl. in [place]? --     Most recent vital signs: Vitals:   08/28/22 2327 08/28/22 2358  BP:  (!) 167/106  Pulse:  78  Resp:  15  Temp: 98 F (36.7 C) 98 F (36.7 C)  SpO2:  96%     General: Alert and in no acute distress.  Cardiovascular:  Good peripheral perfusion Respiratory: Normal respiratory effort without tachypnea or retractions. Lungs CTAB.  Musculoskeletal: Full range of motion to all extremities.  Visualization of the right knee reveals gross edema about the right knee when compared with left.  No open wounds appreciated.  Patient is an roughly 30 to 35 degrees of flexion and cannot extend his knee past.  Patient cannot move his right knee currently.  He  is able to move his toes distally.  Examination of the hip, ankle is unremarkable.  Dorsalis pedis pulse and sensation intact distal to the knee. Neurologic:  No gross focal neurologic deficits are appreciated.  Skin:   No rash noted Other:   ED Results / Procedures / Treatments   Labs (all labs ordered are listed, but only abnormal results are displayed) Labs Reviewed  COMPREHENSIVE METABOLIC PANEL - Abnormal; Notable for the following components:      Result Value   Glucose, Bld 124 (*)    Calcium 8.4 (*)    All other components within normal limits  CBC WITH DIFFERENTIAL/PLATELET - Abnormal; Notable for the following components:   WBC 10.9 (*)    Hemoglobin 12.3 (*)    HCT 37.6 (*)    RDW 21.4 (*)    Neutro Abs 8.6 (*)    All other components within normal limits     EKG     RADIOLOGY  I personally viewed, evaluated, and interpreted these images as part of my medical decision making, as  well as reviewing the written report by the radiologist.  ED Provider Interpretation: Visualization of x-ray reveals a nondisplaced noncomminuted fracture of the distal femur that runs from the medial metaphysis to the intercondylar notch.  I discussed the findings with orthopedic surgeon, Dr. Sharlet Salina.  CT scan reveals comminuted slightly displaced fracture of the distal femur.  This does run into intercondylar space to the medial metadiaphysis.  CT Knee Right Wo Contrast  Result Date: 08/28/2022 CLINICAL DATA:  Fracture, knee Femur fracture of distal metaphysis through intracondylar notch EXAM: CT OF THE RIGHT KNEE WITHOUT CONTRAST TECHNIQUE: Multidetector CT imaging of the right knee was performed according to the standard protocol. Multiplanar CT image reconstructions were also generated. RADIATION DOSE REDUCTION: This exam was performed according to the departmental dose-optimization program which includes automated exposure control, adjustment of the mA and/or kV according to patient  size and/or use of iterative reconstruction technique. COMPARISON:  Same day radiograph FINDINGS: Bones/Joint/Cartilage There is an intra-articular comminuted fracture of the distal femur, extending from the medial metaphysis through the intercondylar notch. There is mild medial displacement. There is no evidence of tibial plateau fracture or proximal fibular fracture. No acute patellar fracture. 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 Ligaments Suboptimally assessed by CT. Muscles and Tendons No acute myotendinous abnormality by CT. No intramuscular collection. Soft tissues Extensive soft tissue swelling along the distal femur. IMPRESSION: Comminuted intra-articular fracture of the distal femur extending from the medial metaphysis through the intercondylar notch. Mild medial displacement. Electronically Signed   By: Maurine Simmering M.D.   On: 08/28/2022 17:06   DG Chest 1 View  Result Date: 08/28/2022 CLINICAL DATA:  Preoperative, right knee surgery EXAM: CHEST  1 VIEW COMPARISON:  07/31/2014 FINDINGS: The heart size and mediastinal contours are within normal limits. Mild, diffuse bilateral interstitial pulmonary opacity. The visualized skeletal structures are unremarkable. IMPRESSION: Mild, diffuse bilateral interstitial pulmonary opacity, which may reflect edema or atypical/viral infection. No focal airspace opacity. Electronically Signed   By: Delanna Ahmadi M.D.   On: 08/28/2022 16:47   DG Knee 2 Views Right  Result Date: 08/28/2022 CLINICAL DATA:  Knee gave out while walking.  Pain and swelling. EXAM: RIGHT KNEE - 1-2 VIEW COMPARISON:  None Available. FINDINGS: There is a nondisplaced, non comminuted fracture of the distal femur. Fracture extends obliquely from the medial metaphysis to the intercondylar notch. No other fractures.  No bone lesions. Moderate to large joint effusion. IMPRESSION: 1.  Nondisplaced, non comminuted fracture of the distal femur as detailed above, associated with a large joint effusion. Electronically Signed   By: Lajean Manes M.D.   On: 08/28/2022 15:40    PROCEDURES:  Critical Care performed: No  Procedures   MEDICATIONS ORDERED IN ED: Medications  HYDROmorphone (DILAUDID) injection 1 mg (has no administration in time range)  sodium chloride 0.9 % bolus 1,000 mL (0 mLs Intravenous Stopped 08/28/22 1920)  ondansetron (ZOFRAN) injection 4 mg (4 mg Intravenous Given 08/28/22 1710)  fentaNYL (SUBLIMAZE) injection 100 mcg (100 mcg Intravenous Given 08/28/22 1710)  HYDROmorphone (DILAUDID) injection 0.5 mg (0.5 mg Intravenous Given 08/28/22 1821)  HYDROmorphone (DILAUDID) injection 0.5 mg (0.5 mg Intravenous Given 08/28/22 1902)  HYDROmorphone (DILAUDID) injection 1 mg (1 mg Intravenous Given 08/28/22 2124)  HYDROmorphone (DILAUDID) injection 1 mg (1 mg Intravenous Given 08/28/22 2315)     IMPRESSION / MDM / ASSESSMENT AND PLAN / ED COURSE  I reviewed the triage vital  signs and the nursing notes.                              Differential diagnosis includes, but is not limited to, knee sprain, ACL tear, MCL tear, fracture, patellar dislocation  Patient's presentation is most consistent with acute presentation with potential threat to life or bodily function.   Patient's diagnosis is consistent with distal femur fracture.  Patient presented to the emergency department after sustaining an injury to the right knee.  Patient was stepping up from his house into the garage, when he felt and heard a pop.  Patient states that he immediately fell but did not sustain any other injury during the fall.  Patient is having significant pain to the right knee with associated edema.  No range of motion was performed initially given the amount of edema and tenderness about the knee joint.  Pulse and sensation have been intact distally.  Compartments wall edematous have been soft.  I  reached out to her orthopedic surgeon, Dr. Sharlet Salina who after evaluation recommended that the patient would need a higher level of care for orthopedic repair.  He recommends transfer at this time.  I reached out to [facility], orthopedic surgeon, Dr. [name] agrees to accept the patient in transfer for surgery tomorrow.  Surgeons recommendation was to place padding, then Ace bandage with slight compression about the knee and knee immobilizer.  This is performed at this time.  Patient has been maintained with pain medications throughout his stay with [place].  Again this appears to be localized trauma to the right knee as he has had no other injury or complaint.  Patient will be transferred via [facility] to [facility] for orthopedic management of distal femur fracture..  Clinical Course as of 08/29/22 0004  Sun Aug 28, 2022  1820 Patient had CT scan which revealed a comminuted slightly displaced distal femur fracture.  This is intra-articular in nature.  Orthopedic surgery recommended reaching out to a tertiary center for referral for surgery.  Patient will have a long-leg posterior OCL splint applied.  Patient has significant edema about the knee but at this time patient has good pulses, compartments are still soft.  At this time no concern for compartment syndrome.  Reaching out to [facility] for potential transfer at this time. [JC]  1935 Patient will be accepted to [facility] for a transfer.  Orthopedic surgery advises to use padding likely or splinting the patient, with overlying Ace bandage with mild compression, with knee immobilizer. [JC]    Clinical Course User Index [JC] Yennifer Segovia, Charline Bills, PA-C     FINAL CLINICAL IMPRESSION(S) / ED DIAGNOSES   Final diagnoses:  Closed comminuted intra-articular fracture of distal end of right femur, initial encounter ([place])     Rx / DC Orders   ED Discharge Orders     None        Note:  This document was prepared using Dragon voice recognition  software and may include unintentional dictation errors.   Darletta Moll, PA-C 08/29/22 0005    Lucillie Garfinkel, MD 08/30/22 [phone]

## 2022-08-28 NOTE — ED Notes (Signed)
[facility] called for potential Orthopedics transfer per Roderic Palau

## 2022-08-28 NOTE — ED Notes (Signed)
Pt assigned bed at [facility] [phone].

## 2022-08-28 NOTE — Progress Notes (Signed)
Consult received from [facility] ED for comminuted closed R supracondylar femur fx with intraarticular extension. Admit to ortho at [facility] cone. Requested bulky compressive dressing with cast padding and ace wrap. Apply knee immobilizer. Ice, elevation toes above nose. NWB RLE. NPO after MN for possible surgery tomorrow depending on OR availability. Discussed patient with [name], who will taker over. Hold chemical DVT ppx.

## 2022-08-28 NOTE — ED Triage Notes (Signed)
Pt reports was walking and his knee gave out and he felt a pop and since then it has swollen a lot and is more painful. Pt reports cannot walk on it or lift it

## 2022-08-29 ENCOUNTER — Inpatient Hospital Stay: Admission: RE | Admit: 2022-08-29 | Payer: 59 | Source: Home / Self Care | Admitting: Orthopedic Surgery

## 2022-08-29 ENCOUNTER — Inpatient Hospital Stay (HOSPITAL_COMMUNITY)
Admission: AD | Admit: 2022-08-29 | Discharge: 2022-09-02 | DRG: 481 | Disposition: A | Discharge status: Home / Self Care | Payer: 59 | Source: Other Acute Inpatient Hospital | Attending: Orthopedic Surgery | Admitting: Orthopedic Surgery

## 2022-08-29 ENCOUNTER — Inpatient Hospital Stay (HOSPITAL_COMMUNITY): Payer: 59

## 2022-08-29 DIAGNOSIS — W109XXA Fall (on) (from) unspecified stairs and steps, initial encounter: Secondary | ICD-10-CM | POA: Diagnosis not present

## 2022-08-29 DIAGNOSIS — Z9884 Bariatric surgery status: Secondary | ICD-10-CM | POA: Diagnosis not present

## 2022-08-29 DIAGNOSIS — Z7901 Long term (current) use of anticoagulants: Secondary | ICD-10-CM | POA: Diagnosis not present

## 2022-08-29 DIAGNOSIS — Z800 Family history of malignant neoplasm of digestive organs: Secondary | ICD-10-CM

## 2022-08-29 DIAGNOSIS — E559 Vitamin D deficiency, unspecified: Secondary | ICD-10-CM | POA: Diagnosis present

## 2022-08-29 DIAGNOSIS — F419 Anxiety disorder, unspecified: Secondary | ICD-10-CM | POA: Diagnosis not present

## 2022-08-29 DIAGNOSIS — S72351A Displaced comminuted fracture of shaft of right femur, initial encounter for closed fracture: Secondary | ICD-10-CM | POA: Diagnosis not present

## 2022-08-29 DIAGNOSIS — M80051A Age-related osteoporosis with current pathological fracture, right femur, initial encounter for fracture: Principal | ICD-10-CM | POA: Diagnosis present

## 2022-08-29 DIAGNOSIS — Z825 Family history of asthma and other chronic lower respiratory diseases: Secondary | ICD-10-CM | POA: Diagnosis not present

## 2022-08-29 DIAGNOSIS — M549 Dorsalgia, unspecified: Secondary | ICD-10-CM | POA: Diagnosis not present

## 2022-08-29 DIAGNOSIS — Z801 Family history of malignant neoplasm of trachea, bronchus and lung: Secondary | ICD-10-CM

## 2022-08-29 DIAGNOSIS — Z8249 Family history of ischemic heart disease and other diseases of the circulatory system: Secondary | ICD-10-CM

## 2022-08-29 DIAGNOSIS — D62 Acute posthemorrhagic anemia: Secondary | ICD-10-CM | POA: Diagnosis present

## 2022-08-29 DIAGNOSIS — G4733 Obstructive sleep apnea (adult) (pediatric): Secondary | ICD-10-CM | POA: Diagnosis present

## 2022-08-29 DIAGNOSIS — Z823 Family history of stroke: Secondary | ICD-10-CM | POA: Diagnosis not present

## 2022-08-29 DIAGNOSIS — F17290 Nicotine dependence, other tobacco product, uncomplicated: Secondary | ICD-10-CM | POA: Diagnosis not present

## 2022-08-29 DIAGNOSIS — S72451A Displaced supracondylar fracture without intracondylar extension of lower end of right femur, initial encounter for closed fracture: Principal | ICD-10-CM | POA: Diagnosis present

## 2022-08-29 DIAGNOSIS — S72401A Unspecified fracture of lower end of right femur, initial encounter for closed fracture: Secondary | ICD-10-CM | POA: Diagnosis not present

## 2022-08-29 DIAGNOSIS — Z841 Family history of disorders of kidney and ureter: Secondary | ICD-10-CM | POA: Diagnosis not present

## 2022-08-29 DIAGNOSIS — F17210 Nicotine dependence, cigarettes, uncomplicated: Secondary | ICD-10-CM | POA: Diagnosis not present

## 2022-08-29 DIAGNOSIS — S72461A Displaced supracondylar fracture with intracondylar extension of lower end of right femur, initial encounter for closed fracture: Secondary | ICD-10-CM | POA: Diagnosis not present

## 2022-08-29 DIAGNOSIS — M5451 Vertebrogenic low back pain: Secondary | ICD-10-CM | POA: Diagnosis not present

## 2022-08-29 DIAGNOSIS — Z833 Family history of diabetes mellitus: Secondary | ICD-10-CM | POA: Diagnosis not present

## 2022-08-29 DIAGNOSIS — Z79899 Other long term (current) drug therapy: Secondary | ICD-10-CM

## 2022-08-29 DIAGNOSIS — S79921A Unspecified injury of right thigh, initial encounter: Secondary | ICD-10-CM | POA: Diagnosis not present

## 2022-08-29 DIAGNOSIS — I10 Essential (primary) hypertension: Secondary | ICD-10-CM | POA: Diagnosis present

## 2022-08-29 DIAGNOSIS — G8918 Other acute postprocedural pain: Secondary | ICD-10-CM | POA: Diagnosis not present

## 2022-08-29 DIAGNOSIS — G8929 Other chronic pain: Secondary | ICD-10-CM | POA: Diagnosis not present

## 2022-08-29 DIAGNOSIS — R69 Illness, unspecified: Secondary | ICD-10-CM | POA: Diagnosis not present

## 2022-08-29 DIAGNOSIS — E785 Hyperlipidemia, unspecified: Secondary | ICD-10-CM | POA: Diagnosis not present

## 2022-08-29 DIAGNOSIS — M898X9 Other specified disorders of bone, unspecified site: Secondary | ICD-10-CM | POA: Diagnosis not present

## 2022-08-29 DIAGNOSIS — W108XXA Fall (on) (from) other stairs and steps, initial encounter: Secondary | ICD-10-CM | POA: Diagnosis not present

## 2022-08-29 HISTORY — DX: Essential (primary) hypertension: I10

## 2022-08-29 LAB — SURGICAL PCR SCREEN
MRSA, PCR: NEGATIVE
Staphylococcus aureus: NEGATIVE

## 2022-08-29 LAB — HIV ANTIBODY (ROUTINE TESTING W REFLEX): HIV Screen 4th Generation wRfx: NONREACTIVE

## 2022-08-29 MED ORDER — METHOCARBAMOL 1000 MG/10ML IJ SOLN
1000.0000 mg | Freq: Three times a day (TID) | INTRAVENOUS | Status: DC
  Administered 2022-08-29 – 2022-09-02 (×9): 1000 mg via INTRAVENOUS
  Filled 2022-08-29: qty 10
  Filled 2022-08-29: qty 1000
  Filled 2022-08-29 (×3): qty 10
  Filled 2022-08-29: qty 1000
  Filled 2022-08-29 (×3): qty 10
  Filled 2022-08-29: qty 1000
  Filled 2022-08-29: qty 10
  Filled 2022-08-29: qty 1000
  Filled 2022-08-29 (×2): qty 10
  Filled 2022-08-29: qty 1000

## 2022-08-29 MED ORDER — POLYETHYLENE GLYCOL 3350 17 G PO PACK: 17.0000 g | PACK | Freq: Every day | ORAL | Status: DC | PRN

## 2022-08-29 MED ORDER — DIPHENHYDRAMINE HCL 12.5 MG/5ML PO ELIX: 12.5000 mg | ORAL_SOLUTION | ORAL | Status: DC | PRN

## 2022-08-29 MED ORDER — ACETAMINOPHEN 325 MG PO TABS: 325.0000 mg | ORAL_TABLET | Freq: Four times a day (QID) | ORAL | Status: DC | PRN

## 2022-08-29 MED ORDER — OXYCODONE HCL 5 MG PO TABS
7.5000 mg | ORAL_TABLET | Freq: Once | ORAL | Status: AC
  Administered 2022-08-29: 7.5 mg via ORAL
  Filled 2022-08-29: qty 2

## 2022-08-29 MED ORDER — LACTATED RINGERS IV SOLN: INTRAVENOUS | Status: DC

## 2022-08-29 MED ORDER — TRANEXAMIC ACID-NACL 1000-0.7 MG/100ML-% IV SOLN
1000.0000 mg | INTRAVENOUS | Status: AC
  Filled 2022-08-29: qty 100

## 2022-08-29 MED ORDER — PANTOPRAZOLE SODIUM 40 MG PO TBEC
40.0000 mg | DELAYED_RELEASE_TABLET | Freq: Every day | ORAL | Status: DC
  Administered 2022-08-29 – 2022-09-02 (×4): 40 mg via ORAL
  Filled 2022-08-29 (×4): qty 1

## 2022-08-29 MED ORDER — ONDANSETRON HCL 4 MG/2ML IJ SOLN: 4.0000 mg | Freq: Four times a day (QID) | INTRAMUSCULAR | Status: DC | PRN

## 2022-08-29 MED ORDER — HYDRALAZINE HCL 20 MG/ML IJ SOLN: 10.0000 mg | Freq: Three times a day (TID) | INTRAMUSCULAR | Status: DC | PRN

## 2022-08-29 MED ORDER — HYDROMORPHONE HCL 2 MG PO TABS
2.0000 mg | ORAL_TABLET | ORAL | Status: DC | PRN
  Administered 2022-08-29: 3 mg via ORAL
  Filled 2022-08-29: qty 2

## 2022-08-29 MED ORDER — TRAZODONE HCL 50 MG PO TABS
50.0000 mg | ORAL_TABLET | Freq: Every day | ORAL | Status: DC
  Administered 2022-08-29 – 2022-09-01 (×4): 50 mg via ORAL
  Filled 2022-08-29 (×4): qty 1

## 2022-08-29 MED ORDER — CHLORHEXIDINE GLUCONATE 4 % EX LIQD
60.0000 mL | Freq: Once | CUTANEOUS | Status: AC
  Administered 2022-08-29: 4 via TOPICAL
  Filled 2022-08-29: qty 60

## 2022-08-29 MED ORDER — OXYCODONE HCL 5 MG PO TABS
5.0000 mg | ORAL_TABLET | ORAL | Status: DC | PRN
  Administered 2022-08-30 – 2022-09-02 (×8): 10 mg via ORAL
  Filled 2022-08-29 (×8): qty 2

## 2022-08-29 MED ORDER — ACETAMINOPHEN 500 MG PO TABS
1000.0000 mg | ORAL_TABLET | Freq: Four times a day (QID) | ORAL | Status: DC
  Administered 2022-08-29: 1000 mg via ORAL
  Filled 2022-08-29 (×3): qty 2

## 2022-08-29 MED ORDER — CEFAZOLIN SODIUM-DEXTROSE 2-4 GM/100ML-% IV SOLN
2.0000 g | INTRAVENOUS | Status: DC
  Filled 2022-08-29: qty 100

## 2022-08-29 MED ORDER — ENSURE PRE-SURGERY PO LIQD
296.0000 mL | Freq: Once | ORAL | Status: AC
  Administered 2022-08-29: 296 mL via ORAL
  Filled 2022-08-29: qty 296

## 2022-08-29 MED ORDER — ACETAMINOPHEN 500 MG PO TABS
1000.0000 mg | ORAL_TABLET | Freq: Once | ORAL | Status: AC
  Administered 2022-08-29: 1000 mg via ORAL

## 2022-08-29 MED ORDER — HYDROMORPHONE HCL 1 MG/ML IJ SOLN: 0.5000 mg | INTRAMUSCULAR | Status: DC | PRN

## 2022-08-29 MED ORDER — CEFAZOLIN SODIUM-DEXTROSE 2-4 GM/100ML-% IV SOLN
2.0000 g | INTRAVENOUS | Status: AC
  Administered 2022-08-30: 2 g via INTRAVENOUS
  Filled 2022-08-29: qty 100

## 2022-08-29 MED ORDER — MORPHINE SULFATE (PF) 2 MG/ML IV SOLN
2.0000 mg | INTRAVENOUS | Status: DC | PRN
  Administered 2022-08-31 – 2022-09-02 (×5): 2 mg via INTRAVENOUS
  Filled 2022-08-29 (×5): qty 1

## 2022-08-29 MED ORDER — DOCUSATE SODIUM 100 MG PO CAPS
100.0000 mg | ORAL_CAPSULE | Freq: Two times a day (BID) | ORAL | Status: DC
  Administered 2022-08-31: 100 mg via ORAL
  Filled 2022-08-29 (×4): qty 1

## 2022-08-29 MED ORDER — GABAPENTIN 300 MG PO CAPS
300.0000 mg | ORAL_CAPSULE | Freq: Two times a day (BID) | ORAL | Status: DC
  Administered 2022-08-29 – 2022-09-02 (×7): 300 mg via ORAL
  Filled 2022-08-29 (×7): qty 1

## 2022-08-29 MED ORDER — FERROUS SULFATE 325 (65 FE) MG PO TABS
325.0000 mg | ORAL_TABLET | Freq: Every day | ORAL | Status: DC
  Administered 2022-08-31 – 2022-09-02 (×3): 325 mg via ORAL
  Filled 2022-08-29 (×3): qty 1

## 2022-08-29 MED ORDER — METHOCARBAMOL 1000 MG/10ML IJ SOLN
500.0000 mg | Freq: Four times a day (QID) | INTRAVENOUS | Status: DC | PRN
  Filled 2022-08-29: qty 5

## 2022-08-29 MED ORDER — HYDROMORPHONE HCL 1 MG/ML IJ SOLN
0.5000 mg | Freq: Once | INTRAMUSCULAR | Status: DC
  Administered 2022-08-29: 0.5 mg via INTRAVENOUS
  Filled 2022-08-29: qty 0.5

## 2022-08-29 MED ORDER — POVIDONE-IODINE 10 % EX SWAB
2.0000 | Freq: Once | CUTANEOUS | Status: AC
  Administered 2022-08-30: 2 via TOPICAL

## 2022-08-29 MED ORDER — OXYCODONE HCL 5 MG PO TABS: 5.0000 mg | ORAL_TABLET | ORAL | Status: DC | PRN

## 2022-08-29 MED ORDER — ACETAMINOPHEN 10 MG/ML IV SOLN
1000.0000 mg | Freq: Four times a day (QID) | INTRAVENOUS | Status: DC
  Administered 2022-08-29 – 2022-08-30 (×3): 1000 mg via INTRAVENOUS
  Filled 2022-08-29 (×2): qty 100

## 2022-08-29 MED ORDER — SENNA 8.6 MG PO TABS
1.0000 | ORAL_TABLET | Freq: Two times a day (BID) | ORAL | Status: DC
  Administered 2022-08-31: 8.6 mg via ORAL
  Filled 2022-08-29 (×4): qty 1

## 2022-08-29 MED ORDER — KETOROLAC TROMETHAMINE 15 MG/ML IJ SOLN
15.0000 mg | Freq: Three times a day (TID) | INTRAMUSCULAR | Status: AC
  Administered 2022-08-29 – 2022-09-01 (×8): 15 mg via INTRAVENOUS
  Filled 2022-08-29 (×8): qty 1

## 2022-08-29 MED ORDER — HYDRALAZINE HCL 20 MG/ML IJ SOLN
10.0000 mg | Freq: Once | INTRAMUSCULAR | Status: AC
Start: 2022-08-29 — End: 2022-08-29
  Administered 2022-08-29: 10 mg via INTRAVENOUS
  Filled 2022-08-29: qty 1

## 2022-08-29 MED ORDER — ONDANSETRON HCL 4 MG PO TABS: 4.0000 mg | ORAL_TABLET | Freq: Four times a day (QID) | ORAL | Status: DC | PRN

## 2022-08-29 MED ORDER — HYDROMORPHONE HCL 1 MG/ML IJ SOLN
0.5000 mg | INTRAMUSCULAR | Status: DC | PRN
  Administered 2022-08-29 (×2): 1 mg via INTRAVENOUS
  Filled 2022-08-29 (×2): qty 1

## 2022-08-29 MED ORDER — METHOCARBAMOL 500 MG PO TABS
500.0000 mg | ORAL_TABLET | Freq: Four times a day (QID) | ORAL | Status: DC | PRN
  Administered 2022-08-29 (×2): 500 mg via ORAL
  Filled 2022-08-29 (×2): qty 1

## 2022-08-29 MED ORDER — HYDROCHLOROTHIAZIDE 12.5 MG PO TABS
12.5000 mg | ORAL_TABLET | Freq: Every day | ORAL | Status: DC
  Administered 2022-08-29: 12.5 mg via ORAL
  Filled 2022-08-29: qty 1

## 2022-08-29 MED ORDER — DEXTROSE 5 % IV SOLN
500.0000 mg | Freq: Four times a day (QID) | INTRAVENOUS | Status: DC | PRN
Start: 2022-08-29 — End: 2022-08-29

## 2022-08-29 MED ORDER — HYDROMORPHONE HCL 2 MG PO TABS: 1.0000 mg | ORAL_TABLET | ORAL | Status: DC | PRN

## 2022-08-29 MED ORDER — HYDROMORPHONE HCL 1 MG/ML IJ SOLN
0.5000 mg | INTRAMUSCULAR | Status: DC | PRN
  Administered 2022-08-29: 1 mg via INTRAVENOUS
  Filled 2022-08-29: qty 1

## 2022-08-29 NOTE — Consult Note (Signed)
Orthopaedic Trauma Service (OTS) Consult   Patient ID: Harold Smith MRN: [id number] DOB/AGE: [birth year] 56 y.o.   Reason for Consult: Medial condyle fracture of right distal femur Referring Physician: Rod Can, MD   HPI: Harold Smith is an 56 y.o. male who presented to [facility] with complaints of right knee pain.  Patient sustained injury on 08/28/2022.  States he was going downstairs and stepped awkwardly and fell.  Had immediate onset of pain in his right knee.  He does note some antecedent numbness along the inside of his right knee for about 3 weeks.  He does have chronic back issues for which she is awaiting surgery from either Dr. Lorin Mercy or Louanne Skye.  This was supposed to happen several months ago however he on preoperative work-up he was found to be anemic and he is being treated for this.  Upon evaluation in the emergency department at [facility] regional he was found to have appears that this injury was unable to be addressed by the orthopedist on-call over at [facility].  As such Dr. [name] was contacted regarding this injury who contacted [name] and orthopedic trauma service.  Patient was brought over to [facility] and arrived around 0 100 today.  He complains of right knee pain.  Denies any pain elsewhere.  He is unable to get comfortable at this current time but pain is tolerable for the most part.  He denies any new onset numbness or tingling.  Denies any additional injuries elsewhere.  He does use a cane at baseline to mobilize for balance.  Lives with family  History of gastric bypass, Roux-en-Y procedure in 2009 Chronic back pain and DDD  Family medical history noted for hypertension diabetes kidney disease colon cancer asthma stroke lung cancer  Patient is a social drinker.  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  Past Medical History:  Diagnosis Date   Allergic rhinitis due to pollen    Anxiety    Colon polyps    Diverticulitis large intestine    Obesity    Osteoarthritis, knee    Sleep apnea    does not use cpap   Sleep disturbance     Past Surgical History:  Procedure Laterality Date   ACHILLES TENDON REPAIR Right 12/26/2006   CATARACT EXTRACTION W/PHACO Left 07/20/2021   Procedure: CATARACT EXTRACTION PHACO AND INTRAOCULAR LENS PLACEMENT (IOC) LEFT 2.11 00:24.0;  Surgeon: Birder Robson, MD;  Location: [place];  Service: Ophthalmology;  Laterality: Left;  sleep apnea   CATARACT EXTRACTION W/PHACO Right 08/03/2021   Procedure: CATARACT EXTRACTION PHACO AND INTRAOCULAR LENS PLACEMENT (IOC) RIGHT;  Surgeon: Birder Robson, MD;  Location: [place];  Service: Ophthalmology;  Laterality: Right;  3.19 0:33.0   CHOLECYSTECTOMY     COLON RESECTION Left 12/26/2004   due to diverticular disease at [place]  2013?   COLONOSCOPY WITH PROPOFOL N/A 05/22/2018   Procedure: COLONOSCOPY WITH PROPOFOL;  Surgeon: Lin Landsman, MD;  Location: [facility] ENDOSCOPY;  Service: Gastroenterology;  Laterality: N/A;   COLONOSCOPY WITH PROPOFOL N/A 10/22/2020   Procedure: COLONOSCOPY WITH PROPOFOL;  Surgeon: Lin Landsman, MD;  Location: [place];  Service: Endoscopy;  Laterality: N/A;  priority 4   COLONOSCOPY WITH PROPOFOL N/A 11/10/2021   Procedure: COLONOSCOPY WITH PROPOFOL;  Surgeon: Lin Landsman, MD;  Location: [facility] ENDOSCOPY;  Service: Gastroenterology;  Laterality: N/A;   COLONOSCOPY WITH PROPOFOL N/A 06/16/2022   Procedure: COLONOSCOPY WITH PROPOFOL;  Surgeon: Lin Landsman, MD;  Location: [place];  Service: Endoscopy;  Laterality: N/A;   DRUG INDUCED ENDOSCOPY N/A 06/10/2019   Procedure: DRUG INDUCED SLEEP ENDOSCOPY;  Surgeon: Jerrell Belfast, MD;  Location: [place];  Service: ENT;  Laterality: N/A;    ESOPHAGOGASTRODUODENOSCOPY (EGD) WITH PROPOFOL N/A 06/16/2022   Procedure: ESOPHAGOGASTRODUODENOSCOPY (EGD) WITH PROPOFOL;  Surgeon: Lin Landsman, MD;  Location: [place];  Service: Endoscopy;  Laterality: N/A;   EYE SURGERY     GASTRIC BYPASS N/A 12/27/2007   HIATAL HERNIA REPAIR  09/25/2014   at [place]   POLYPECTOMY  10/22/2020   Procedure: POLYPECTOMY;  Surgeon: Lin Landsman, MD;  Location: [place];  Service: Endoscopy;;   POLYPECTOMY  06/16/2022   Procedure: POLYPECTOMY;  Surgeon: Lin Landsman, MD;  Location: [place];  Service: Endoscopy;;   ROTATOR CUFF REPAIR     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    Family History  Problem Relation Age of Onset   Hypertension Mother    Diabetes Mother    Kidney disease Mother    Heart disease Mother    Colon cancer Father 104   Asthma Sister    Obesity Sister    Stroke Sister    Obesity Brother    Diabetes Other    Heart disease Other    Hypertension Other    Kidney disease Other    Cancer Paternal Uncle        unk type, possible prostate   Lung cancer Paternal Grandmother     Social History:  reports that he has never smoked. He has been exposed to tobacco smoke. He has never used smokeless tobacco. He reports current alcohol use. He reports that he does not use drugs.  Allergies: No Known Allergies  Medications: I have reviewed the patient's current medications. Current Meds  Medication Sig   cetirizine (ZYRTEC) 10 MG tablet Take 10 mg by mouth daily.   Cyanocobalamin (VITAMIN B-12 PO) Take 1 tablet by mouth daily.   cyclobenzaprine (FLEXERIL) 10 MG tablet Take 10 mg by mouth 2 (two) times daily as needed for muscle spasms.   DULoxetine (CYMBALTA) 30 MG capsule Take 30 mg by mouth daily.   ergocalciferol (VITAMIN D2) 1.25 MG (50000 UT) capsule Take 50,000 Units by mouth every Monday.   FEROSUL 325 (65 Fe) MG tablet TAKE 1 TABLET BY MOUTH DAILY WITH BREAKFAST  (Patient taking differently: Take 325 mg by mouth daily with breakfast.)   fluticasone (FLONASE) 50 MCG/ACT nasal spray Place 2 sprays into both nostrils daily.   gabapentin (NEURONTIN) 300 MG capsule Take 1 capsule (300 mg total) by mouth 2 (two) times daily. Plus 4 at bedtime (Patient taking differently: Take 1,200 mg by mouth at bedtime.)   HYDROcodone-acetaminophen (NORCO) 10-325 MG tablet Take 1 tablet by mouth daily as needed for severe pain.   ipratropium (ATROVENT) 0.06 % nasal spray Place 2 sprays into both nostrils daily.   lipase/protease/amylase (CREON) 36000 UNITS CPEP capsule Take 2 capsules (72,000 Units total) by mouth 3 (three) times daily with meals. May also take 1 capsule (36,000 Units total) as needed (with snacks).   meloxicam (MOBIC) 15 MG tablet Take 1 tablet (15 mg total) by mouth daily as needed for pain.   methocarbamol (ROBAXIN) 500 MG tablet Take 500 mg by  mouth every 6 (six) hours as needed for muscle spasms.   Multiple Vitamin (MULTIVITAMIN) tablet Take 1 tablet by mouth daily.   omeprazole (PRILOSEC) 40 MG capsule Take 1 capsule (40 mg total) by mouth 2 (two) times daily before a meal.   QUEtiapine (SEROQUEL) 25 MG tablet Take 75 mg by mouth at bedtime.   sildenafil (REVATIO) 20 MG tablet Take 3-5 tablets as needed 1 hour prior to intercourse   Sodium Fluoride (PREVIDENT 5000 BOOSTER PLUS DT) Place 1 application  onto teeth 2 (two) times daily.   traZODone (DESYREL) 50 MG tablet Take 50 mg by mouth at bedtime.   zolpidem (AMBIEN CR) 12.5 MG CR tablet Take 1 tablet (12.5 mg total) by mouth at bedtime as needed for sleep.     Results for orders placed or performed during the hospital encounter of 08/29/22 (from the past 48 hour(s))  Surgical pcr screen     Status: None   Collection Time: 08/29/22  1:21 AM   Specimen: Nasal Mucosa; Nasal Swab  Result Value Ref Range   MRSA, PCR NEGATIVE NEGATIVE   Staphylococcus aureus NEGATIVE NEGATIVE    Comment: (NOTE) The  Xpert SA Assay (FDA approved for NASAL specimens in patients 98 years of age and older), is one component of a comprehensive surveillance program. It is not intended to diagnose infection nor to guide or monitor treatment. Performed at [facility], [place] [address], [place], [place] [address]   HIV Antibody (routine testing w rflx)     Status: None   Collection Time: 08/29/22  4:59 AM  Result Value Ref Range   HIV Screen 4th Generation wRfx Non Reactive Non Reactive    Comment: Performed at [facility], [place] [address], [place], [place] [address]    CT Knee Right Wo Contrast  Result Date: 08/28/2022 CLINICAL DATA:  Fracture, knee Femur fracture of distal metaphysis through intracondylar notch EXAM: CT OF THE RIGHT KNEE WITHOUT CONTRAST TECHNIQUE: Multidetector CT imaging of the right knee was performed according to the standard protocol. Multiplanar CT image reconstructions were also generated. RADIATION DOSE REDUCTION: This exam was performed according to the departmental dose-optimization program which includes automated exposure control, adjustment of the mA and/or kV according to patient size and/or use of iterative reconstruction technique. COMPARISON:  Same day radiograph FINDINGS: Bones/Joint/Cartilage There is an intra-articular comminuted fracture of the distal femur, extending from the medial metaphysis through the intercondylar notch. There is mild medial displacement. There is no evidence of tibial plateau fracture or proximal fibular fracture. No acute patellar fracture. 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 Ligaments Suboptimally assessed by CT. Muscles and Tendons No acute myotendinous abnormality by CT. No intramuscular collection. Soft tissues Extensive soft tissue swelling along the distal femur. IMPRESSION: Comminuted intra-articular fracture of the distal femur  extending from the medial metaphysis through the intercondylar notch. Mild medial displacement. Electronically Signed   By: Maurine Simmering M.D.   On: 08/28/2022 17:06   DG Chest 1 View  Result Date: 08/28/2022 CLINICAL DATA:  Preoperative, right knee surgery EXAM: CHEST  1 VIEW COMPARISON:  07/31/2014 FINDINGS: The heart size and mediastinal contours are within normal limits. Mild, diffuse bilateral interstitial pulmonary opacity. The visualized skeletal structures are unremarkable. IMPRESSION: Mild, diffuse bilateral interstitial pulmonary opacity, which may reflect edema or atypical/viral infection. No focal airspace opacity. Electronically Signed   By: Delanna Ahmadi M.D.   On:  08/28/2022 16:47   DG Knee 2 Views Right  Result Date: 08/28/2022 CLINICAL DATA:  Knee gave out while walking.  Pain and swelling. EXAM: RIGHT KNEE - 1-2 VIEW COMPARISON:  None Available. FINDINGS: There is a nondisplaced, non comminuted fracture of the distal femur. Fracture extends obliquely from the medial metaphysis to the intercondylar notch. No other fractures.  No bone lesions. Moderate to large joint effusion. IMPRESSION: 1. Nondisplaced, non comminuted fracture of the distal femur as detailed above, associated with a large joint effusion. Electronically Signed   By: Lajean Manes M.D.   On: 08/28/2022 15:40    Intake/Output      09/03 0701 09/04 0700 09/04 0701 09/05 0700   Urine 650 1425   Stool  0   Total Output 650 1425   Net -650 -1425        Stool Occurrence  1 x      Review of Systems  Constitutional:  Negative for chills and fever.  Eyes:  Negative for blurred vision.  Respiratory:  Negative for shortness of breath and wheezing.   Cardiovascular:  Negative for chest pain.  Gastrointestinal:  Negative for abdominal pain, nausea and vomiting.  Neurological:  Negative for tingling and sensory change.   Blood pressure (!) 162/120, pulse (!) 51, temperature 98.4 F (36.9 C), temperature source Oral,  resp. rate 16, SpO2 98 %. Physical Exam Vitals and nursing note reviewed.  Constitutional:      General: He is awake. He is not in acute distress.    Appearance: Normal appearance. He is well-developed and well-groomed.     Comments: Very pleasant   HENT:     Head: Normocephalic and atraumatic.     Mouth/Throat:     Mouth: Mucous membranes are moist.  Eyes:     Extraocular Movements: Extraocular movements intact.  Cardiovascular:     Rate and Rhythm: Normal rate and regular rhythm.     Heart sounds: S1 normal and S2 normal.  Pulmonary:     Effort: Pulmonary effort is normal. No accessory muscle usage.  Abdominal:     General: There is no distension.     Palpations: Abdomen is soft.  Genitourinary:    Comments: External catheter  Musculoskeletal:     Comments: Right Lower Extremity  Knee immobilizer is in place Bulky compressive dressing from ankle to thigh Swelling is mild No pitting edema Do not remove dressing Ankle and foot are nontender Hip is nontender.  No pain with gentle manipulation of hip Did not manipulate knee DPN, SPN, TN sensory functions intact EHL, FHL, lesser toe motor function intact Ankle flexion, extension, inversion eversion intact No deep calf tenderness Compartments are soft No pain out of proportion with passive stretching of toes or ankle + DP pulse  Skin:    General: Skin is warm and dry.     Capillary Refill: Capillary refill takes less than 2 seconds.  Neurological:     General: No focal deficit present.     Mental Status: He is alert and oriented to person, place, and time.     Comments: Did not assess gait   Psychiatric:        Attention and Perception: Attention and perception normal.        Mood and Affect: Mood normal.        Speech: Speech normal.        Behavior: Behavior normal. Behavior is cooperative.        Thought Content: Thought content normal.  Judgment: Judgment normal.     Assessment/Plan:  56 y/o male  ground level fall with R distal medial femoral condyle fracture   -R distal medial femoral condyle fracture, fragility fracture  OR tomorrow for ORIF  Discussed risks and benefits of surgery vs non-op with pt. He wishes to proceed with surgery   Suspect poor bone quality given mechanism, history of gastric bypass in 2009   NWB x 6 weeks post op  Unrestricted ROM post op   Hinged brace post op     Ice and elevate for now   - Pain management:  Will change pain regimen   Minimal relief with oral dilaudid   - ABL anemia/Hemodynamics  Chronic anemia   Check h/h in am   - Medical issues   Chronic conditions appear stable  - DVT/PE prophylaxis:  Lovenox post op   - ID:   Periop abx  - Metabolic Bone Disease:  Check vitamin d levels  Suspicious for osteoporsis  Recommend dexa as an outpatient   - Activity:  Bed rest tonight  Therapies post op   - FEN/GI prophylaxis/Foley/Lines:  NPO after MN    - Impediments to fracture healing:  Poor bone quality   History of gastric bypass  - Dispo:  OR tomorrow for ORIF R distal femur     Jari Pigg, PA-C [phone] (C) 08/29/2022, 6:31 PM  [facility] [place] [address] [phone] Jenetta [phone] (F)    After 5pm and on the weekends please log on to Amion, go to orthopaedics and the look under the [facility] Call for the provider(s) on call. You can also call our office at [phone] and then follow the prompts to be connected to the call team.

## 2022-08-29 NOTE — ED Notes (Signed)
[place] reviewed by this RN ?

## 2022-08-29 NOTE — Progress Notes (Signed)
  Transition of Care [facility]) Screening Note   Patient Details  Name: Sie Formisano Date of Birth: [birth year]   Transition of Care [facility]) CM/SW Contact:    Pollie Friar, RN Phone Number: 08/29/2022, 1:10 PM    Transition of Care Department [facility]) has reviewed patient. We will continue to monitor patient advancement through interdisciplinary progression rounds. If new patient transition needs arise, please place a TOC consult.

## 2022-08-29 NOTE — Progress Notes (Signed)
Pt arrived to 6n8 via [facility] from [facility]. Denies nausea, c/o LLE pain 8/10. Oriented to room and surroundings. Call light in bed, bed in low position. Will continue to monitor.

## 2022-08-29 NOTE — ED Notes (Signed)
Patient reports pain is currently 9/10.  Medicated prior to transport.  Patient made aware of need for transport and verbal consent and understanding provided from patient.

## 2022-08-30 ENCOUNTER — Other Ambulatory Visit: Payer: Self-pay

## 2022-08-30 ENCOUNTER — Inpatient Hospital Stay (HOSPITAL_COMMUNITY): Payer: 59

## 2022-08-30 ENCOUNTER — Encounter (HOSPITAL_COMMUNITY): Payer: Self-pay | Admitting: Orthopedic Surgery

## 2022-08-30 ENCOUNTER — Inpatient Hospital Stay (HOSPITAL_COMMUNITY): Payer: 59 | Admitting: Anesthesiology

## 2022-08-30 ENCOUNTER — Encounter: Payer: Self-pay | Admitting: Internal Medicine

## 2022-08-30 ENCOUNTER — Encounter (HOSPITAL_COMMUNITY): Admission: AD | Disposition: A | Discharge status: Home / Self Care | Payer: Self-pay | Attending: Orthopedic Surgery

## 2022-08-30 DIAGNOSIS — S72401A Unspecified fracture of lower end of right femur, initial encounter for closed fracture: Secondary | ICD-10-CM

## 2022-08-30 DIAGNOSIS — F17210 Nicotine dependence, cigarettes, uncomplicated: Secondary | ICD-10-CM

## 2022-08-30 DIAGNOSIS — F419 Anxiety disorder, unspecified: Secondary | ICD-10-CM | POA: Diagnosis not present

## 2022-08-30 DIAGNOSIS — I10 Essential (primary) hypertension: Secondary | ICD-10-CM

## 2022-08-30 HISTORY — PX: ORIF FEMUR FRACTURE: SHX2119

## 2022-08-30 LAB — CBC
HCT: 34.1 % — ABNORMAL LOW (ref 39.0–52.0)
Hemoglobin: 11.4 g/dL — ABNORMAL LOW (ref 13.0–17.0)
MCH: 29.7 pg (ref 26.0–34.0)
MCHC: 33.4 g/dL (ref 30.0–36.0)
MCV: 88.8 fL (ref 80.0–100.0)
Platelets: 239 10*3/uL (ref 150–400)
RBC: 3.84 MIL/uL — ABNORMAL LOW (ref 4.22–5.81)
RDW: 20.4 % — ABNORMAL HIGH (ref 11.5–15.5)
WBC: 8.2 10*3/uL (ref 4.0–10.5)
nRBC: 0 % (ref 0.0–0.2)

## 2022-08-30 LAB — COMPREHENSIVE METABOLIC PANEL
ALT: 26 U/L (ref 0–44)
AST: 17 U/L (ref 15–41)
Albumin: 3 g/dL — ABNORMAL LOW (ref 3.5–5.0)
Alkaline Phosphatase: 80 U/L (ref 38–126)
Anion gap: 7 (ref 5–15)
BUN: 8 mg/dL (ref 6–20)
CO2: 24 mmol/L (ref 22–32)
Calcium: 8.3 mg/dL — ABNORMAL LOW (ref 8.9–10.3)
Chloride: 105 mmol/L (ref 98–111)
Creatinine, Ser: 0.7 mg/dL (ref 0.61–1.24)
GFR, Estimated: >60 mL/min (ref >60)
Glucose, Bld: 94 mg/dL (ref 70–99)
Potassium: 3.5 mmol/L (ref 3.5–5.1)
Sodium: 136 mmol/L (ref 135–145)
Total Bilirubin: 0.8 mg/dL (ref 0.3–1.2)
Total Protein: 5.8 g/dL — ABNORMAL LOW (ref 6.5–8.1)

## 2022-08-30 LAB — VITAMIN D 25 HYDROXY (VIT D DEFICIENCY, FRACTURES): Vit D, 25-Hydroxy: 13.94 ng/mL — ABNORMAL LOW (ref 30–100)

## 2022-08-30 SURGERY — OPEN REDUCTION INTERNAL FIXATION (ORIF) DISTAL FEMUR FRACTURE
Anesthesia: Regional | Laterality: Right

## 2022-08-30 MED ORDER — KETOROLAC TROMETHAMINE 15 MG/ML IJ SOLN
INTRAMUSCULAR | Status: DC | PRN
  Administered 2022-08-30: 15 mg via INTRAVENOUS

## 2022-08-30 MED ORDER — ONDANSETRON HCL 4 MG/2ML IJ SOLN
INTRAMUSCULAR | Status: DC | PRN
  Administered 2022-08-30: 4 mg via INTRAVENOUS

## 2022-08-30 MED ORDER — CHLORHEXIDINE GLUCONATE 0.12 % MT SOLN
15.0000 mL | Freq: Once | OROMUCOSAL | Status: AC
Start: 2022-08-30 — End: 2022-08-30

## 2022-08-30 MED ORDER — LIDOCAINE 2% (20 MG/ML) 5 ML SYRINGE
INTRAMUSCULAR | Status: AC
  Filled 2022-08-30: qty 5

## 2022-08-30 MED ORDER — KETAMINE HCL 10 MG/ML IJ SOLN
INTRAMUSCULAR | Status: DC | PRN
  Administered 2022-08-30: 30 mg via INTRAVENOUS
  Administered 2022-08-30 (×2): 10 mg via INTRAVENOUS

## 2022-08-30 MED ORDER — ACETAMINOPHEN 10 MG/ML IV SOLN
INTRAVENOUS | Status: AC
  Filled 2022-08-30: qty 100

## 2022-08-30 MED ORDER — HYDROMORPHONE HCL 1 MG/ML IJ SOLN: 0.2500 mg | INTRAMUSCULAR | Status: DC | PRN

## 2022-08-30 MED ORDER — ENOXAPARIN SODIUM 40 MG/0.4ML IJ SOSY
40.0000 mg | PREFILLED_SYRINGE | INTRAMUSCULAR | Status: DC
  Administered 2022-08-31 – 2022-09-02 (×3): 40 mg via SUBCUTANEOUS
  Filled 2022-08-30 (×3): qty 0.4

## 2022-08-30 MED ORDER — PROPOFOL 10 MG/ML IV BOLUS
INTRAVENOUS | Status: AC
  Filled 2022-08-30: qty 20

## 2022-08-30 MED ORDER — VITAMIN D 25 MCG (1000 UNIT) PO TABS
2000.0000 [IU] | ORAL_TABLET | Freq: Two times a day (BID) | ORAL | Status: DC
  Administered 2022-08-30 – 2022-09-02 (×6): 2000 [IU] via ORAL
  Filled 2022-08-30 (×6): qty 2

## 2022-08-30 MED ORDER — LACTATED RINGERS IV SOLN: INTRAVENOUS | Status: DC

## 2022-08-30 MED ORDER — DEXAMETHASONE SODIUM PHOSPHATE 10 MG/ML IJ SOLN
INTRAMUSCULAR | Status: DC | PRN
  Administered 2022-08-30: 10 mg

## 2022-08-30 MED ORDER — VITAMIN D (ERGOCALCIFEROL) 1.25 MG (50000 UNIT) PO CAPS
50000.0000 [IU] | ORAL_CAPSULE | ORAL | Status: DC
  Administered 2022-08-30: 50000 [IU] via ORAL
  Filled 2022-08-30: qty 1

## 2022-08-30 MED ORDER — ROCURONIUM BROMIDE 10 MG/ML (PF) SYRINGE
PREFILLED_SYRINGE | INTRAVENOUS | Status: DC | PRN
  Administered 2022-08-30: 10 mg via INTRAVENOUS
  Administered 2022-08-30: 80 mg via INTRAVENOUS

## 2022-08-30 MED ORDER — PHENYLEPHRINE HCL-NACL 20-0.9 MG/250ML-% IV SOLN
INTRAVENOUS | Status: DC | PRN
  Administered 2022-08-30: 40 ug/min via INTRAVENOUS

## 2022-08-30 MED ORDER — CHLORHEXIDINE GLUCONATE 0.12 % MT SOLN
OROMUCOSAL | Status: AC
  Administered 2022-08-30: 15 mL via OROMUCOSAL
  Filled 2022-08-30: qty 15

## 2022-08-30 MED ORDER — HYDROCHLOROTHIAZIDE 25 MG PO TABS
25.0000 mg | ORAL_TABLET | Freq: Every day | ORAL | Status: DC
  Administered 2022-08-31 – 2022-09-02 (×3): 25 mg via ORAL
  Filled 2022-08-30 (×3): qty 1

## 2022-08-30 MED ORDER — ONDANSETRON HCL 4 MG/2ML IJ SOLN
INTRAMUSCULAR | Status: AC
  Filled 2022-08-30: qty 2

## 2022-08-30 MED ORDER — KETAMINE HCL 50 MG/5ML IJ SOSY
PREFILLED_SYRINGE | INTRAMUSCULAR | Status: AC
  Filled 2022-08-30: qty 5

## 2022-08-30 MED ORDER — FENTANYL CITRATE (PF) 100 MCG/2ML IJ SOLN
INTRAMUSCULAR | Status: AC
  Administered 2022-08-30: 100 ug
  Filled 2022-08-30: qty 2

## 2022-08-30 MED ORDER — ACETAMINOPHEN 500 MG PO TABS
1000.0000 mg | ORAL_TABLET | Freq: Three times a day (TID) | ORAL | Status: DC
  Administered 2022-08-30 – 2022-09-02 (×9): 1000 mg via ORAL
  Filled 2022-08-30 (×9): qty 2

## 2022-08-30 MED ORDER — EPHEDRINE SULFATE-NACL 50-0.9 MG/10ML-% IV SOSY
PREFILLED_SYRINGE | INTRAVENOUS | Status: DC | PRN
  Administered 2022-08-30: 5 mg via INTRAVENOUS
  Administered 2022-08-30: 2.5 mg via INTRAVENOUS

## 2022-08-30 MED ORDER — ZINC SULFATE 220 (50 ZN) MG PO CAPS
220.0000 mg | ORAL_CAPSULE | Freq: Every day | ORAL | Status: DC
  Administered 2022-08-30 – 2022-09-02 (×4): 220 mg via ORAL
  Filled 2022-08-30 (×4): qty 1

## 2022-08-30 MED ORDER — ROCURONIUM BROMIDE 10 MG/ML (PF) SYRINGE
PREFILLED_SYRINGE | INTRAVENOUS | Status: AC
  Filled 2022-08-30: qty 10

## 2022-08-30 MED ORDER — DEXAMETHASONE SODIUM PHOSPHATE 10 MG/ML IJ SOLN
INTRAMUSCULAR | Status: AC
  Filled 2022-08-30: qty 1

## 2022-08-30 MED ORDER — DEXAMETHASONE SODIUM PHOSPHATE 10 MG/ML IJ SOLN
INTRAMUSCULAR | Status: DC | PRN
  Administered 2022-08-30: 10 mg via INTRAVENOUS

## 2022-08-30 MED ORDER — OXYCODONE HCL 5 MG/5ML PO SOLN: 5.0000 mg | Freq: Once | ORAL | Status: DC | PRN

## 2022-08-30 MED ORDER — AMISULPRIDE (ANTIEMETIC) 5 MG/2ML IV SOLN: 10.0000 mg | Freq: Once | INTRAVENOUS | Status: DC | PRN

## 2022-08-30 MED ORDER — PHENYLEPHRINE 80 MCG/ML (10ML) SYRINGE FOR IV PUSH (FOR BLOOD PRESSURE SUPPORT)
PREFILLED_SYRINGE | INTRAVENOUS | Status: DC | PRN
  Administered 2022-08-30: 160 ug via INTRAVENOUS

## 2022-08-30 MED ORDER — MIDAZOLAM HCL 2 MG/2ML IJ SOLN
INTRAMUSCULAR | Status: AC
  Administered 2022-08-30: 2 mg
  Filled 2022-08-30: qty 2

## 2022-08-30 MED ORDER — ONDANSETRON HCL 4 MG/2ML IJ SOLN
4.0000 mg | Freq: Once | INTRAMUSCULAR | Status: DC | PRN
Start: 2022-08-30 — End: 2022-08-30

## 2022-08-30 MED ORDER — TRANEXAMIC ACID-NACL 1000-0.7 MG/100ML-% IV SOLN
INTRAVENOUS | Status: DC | PRN
  Administered 2022-08-30: 1000 mg via INTRAVENOUS

## 2022-08-30 MED ORDER — 0.9 % SODIUM CHLORIDE (POUR BTL) OPTIME
TOPICAL | Status: DC | PRN
  Administered 2022-08-30: 1000 mL

## 2022-08-30 MED ORDER — KETOROLAC TROMETHAMINE 30 MG/ML IJ SOLN
INTRAMUSCULAR | Status: AC
  Filled 2022-08-30: qty 1

## 2022-08-30 MED ORDER — FENTANYL CITRATE (PF) 250 MCG/5ML IJ SOLN
INTRAMUSCULAR | Status: AC
  Filled 2022-08-30: qty 5

## 2022-08-30 MED ORDER — EPHEDRINE 5 MG/ML INJ
INTRAVENOUS | Status: AC
  Filled 2022-08-30: qty 5

## 2022-08-30 MED ORDER — PHENYLEPHRINE 80 MCG/ML (10ML) SYRINGE FOR IV PUSH (FOR BLOOD PRESSURE SUPPORT)
PREFILLED_SYRINGE | INTRAVENOUS | Status: AC
  Filled 2022-08-30: qty 10

## 2022-08-30 MED ORDER — OXYCODONE HCL 5 MG PO TABS: 5.0000 mg | ORAL_TABLET | Freq: Once | ORAL | Status: DC | PRN

## 2022-08-30 MED ORDER — ROPIVACAINE HCL 5 MG/ML IJ SOLN
INTRAMUSCULAR | Status: DC | PRN
  Administered 2022-08-30: 30 mL via PERINEURAL

## 2022-08-30 MED ORDER — SUGAMMADEX SODIUM 200 MG/2ML IV SOLN
INTRAVENOUS | Status: DC | PRN
  Administered 2022-08-30: 175 mg via INTRAVENOUS

## 2022-08-30 MED ORDER — ORAL CARE MOUTH RINSE: 15.0000 mL | Freq: Once | OROMUCOSAL | Status: AC

## 2022-08-30 MED ORDER — LIDOCAINE 2% (20 MG/ML) 5 ML SYRINGE
INTRAMUSCULAR | Status: DC | PRN
  Administered 2022-08-30: 60 mg via INTRAVENOUS

## 2022-08-30 MED ORDER — FENTANYL CITRATE (PF) 250 MCG/5ML IJ SOLN
INTRAMUSCULAR | Status: DC | PRN
Start: 2022-08-30 — End: 2022-08-30
  Administered 2022-08-30 (×2): 100 ug via INTRAVENOUS

## 2022-08-30 MED ORDER — PROPOFOL 10 MG/ML IV BOLUS
INTRAVENOUS | Status: DC | PRN
  Administered 2022-08-30 (×2): 20 mg via INTRAVENOUS
  Administered 2022-08-30: 200 mg via INTRAVENOUS
  Administered 2022-08-30: 20 mg via INTRAVENOUS

## 2022-08-30 MED FILL — Iron Sucrose Inj 20 MG/ML (Fe Equiv): INTRAVENOUS | Qty: 10 | Status: AC

## 2022-08-30 SURGICAL SUPPLY — 71 items
BAG COUNTER SPONGE SURGICOUNT (BAG) ×1 IMPLANT
BIT DRILL QC SFS 2.5X170 (BIT) IMPLANT
BLADE CLIPPER SURG (BLADE) IMPLANT
BNDG ELASTIC 4X5.8 VLCR STR LF (GAUZE/BANDAGES/DRESSINGS) ×1 IMPLANT
BNDG ELASTIC 6X5.8 VLCR STR LF (GAUZE/BANDAGES/DRESSINGS) ×1 IMPLANT
BNDG GAUZE DERMACEA FLUFF 4 (GAUZE/BANDAGES/DRESSINGS) ×1 IMPLANT
BRUSH SCRUB EZ PLAIN DRY (MISCELLANEOUS) ×2 IMPLANT
CANISTER SUCT 3000ML PPV (MISCELLANEOUS) ×1 IMPLANT
COVER SURGICAL LIGHT HANDLE (MISCELLANEOUS) ×1 IMPLANT
DRAPE C-ARM 42X72 X-RAY (DRAPES) ×1 IMPLANT
DRAPE C-ARMOR (DRAPES) ×1 IMPLANT
DRAPE IMP U-DRAPE 54X76 (DRAPES) ×1 IMPLANT
DRAPE ORTHO SPLIT 77X108 STRL (DRAPES) ×3
DRAPE SURG ORHT 6 SPLT 77X108 (DRAPES) ×3 IMPLANT
DRAPE U-SHAPE 47X51 STRL (DRAPES) ×1 IMPLANT
DRSG ADAPTIC 3X8 NADH LF (GAUZE/BANDAGES/DRESSINGS) ×1 IMPLANT
DRSG MEPILEX BORDER 4X8 (GAUZE/BANDAGES/DRESSINGS) IMPLANT
DRSG PAD ABDOMINAL 8X10 ST (GAUZE/BANDAGES/DRESSINGS) ×4 IMPLANT
ELECT REM PT RETURN 9FT ADLT (ELECTROSURGICAL) ×1
ELECTRODE REM PT RTRN 9FT ADLT (ELECTROSURGICAL) ×1 IMPLANT
EVACUATOR 1/8 PVC DRAIN (DRAIN) IMPLANT
EVACUATOR 3/16  PVC DRAIN (DRAIN)
EVACUATOR 3/16 PVC DRAIN (DRAIN) IMPLANT
GAUZE SPONGE 4X4 12PLY STRL (GAUZE/BANDAGES/DRESSINGS) ×1 IMPLANT
GLOVE BIO SURGEON STRL SZ7.5 (GLOVE) ×1 IMPLANT
GLOVE BIO SURGEON STRL SZ8 (GLOVE) ×1 IMPLANT
GLOVE BIOGEL PI IND STRL 7.5 (GLOVE) ×1 IMPLANT
GLOVE BIOGEL PI IND STRL 8 (GLOVE) ×1 IMPLANT
GLOVE SURG ORTHO LTX SZ7.5 (GLOVE) ×2 IMPLANT
GOWN STRL REUS W/ TWL LRG LVL3 (GOWN DISPOSABLE) ×2 IMPLANT
GOWN STRL REUS W/ TWL XL LVL3 (GOWN DISPOSABLE) ×1 IMPLANT
GOWN STRL REUS W/TWL LRG LVL3 (GOWN DISPOSABLE) ×2
GOWN STRL REUS W/TWL XL LVL3 (GOWN DISPOSABLE) ×1
GUIDEWIRE THREADED 2.8 (WIRE) IMPLANT
K-WIRE 1.6X150 (WIRE) ×1
KIT BASIN OR (CUSTOM PROCEDURE TRAY) ×1 IMPLANT
KIT TURNOVER KIT B (KITS) ×1 IMPLANT
KWIRE 1.6X150 (WIRE) IMPLANT
NDL 22X1.5 STRL (OR ONLY) (MISCELLANEOUS) IMPLANT
NEEDLE 22X1.5 STRL (OR ONLY) (MISCELLANEOUS) IMPLANT
NS IRRIG 1000ML POUR BTL (IV SOLUTION) ×1 IMPLANT
PACK TOTAL JOINT (CUSTOM PROCEDURE TRAY) ×1 IMPLANT
PACK UNIVERSAL I (CUSTOM PROCEDURE TRAY) ×1 IMPLANT
PAD ARMBOARD 7.5X6 YLW CONV (MISCELLANEOUS) ×2 IMPLANT
PAD CAST 4YDX4 CTTN HI CHSV (CAST SUPPLIES) ×1 IMPLANT
PADDING CAST COTTON 4X4 STRL (CAST SUPPLIES)
PADDING CAST COTTON 6X4 STRL (CAST SUPPLIES) ×1 IMPLANT
PLATE LCP RECON 3.5 5H/70 (Plate) IMPLANT
SCREW CANN 32 THRD/80 7.3 (Screw) IMPLANT
SCREW CANN 32 THRD/95 7.3 (Screw) IMPLANT
SCREW CORTEX 3.5 60MM (Screw) IMPLANT
SCREW CORTEX 3.5 65MM (Screw) IMPLANT
SCREW CORTEX 3.5 70MM SELF TAP (Screw) IMPLANT
SPONGE T-LAP 18X18 ~~LOC~~+RFID (SPONGE) ×1 IMPLANT
STAPLER VISISTAT 35W (STAPLE) ×1 IMPLANT
SUCTION FRAZIER HANDLE 10FR (MISCELLANEOUS) ×1
SUCTION TUBE FRAZIER 10FR DISP (MISCELLANEOUS) ×1 IMPLANT
SUT ETHILON 2 0 FS 18 (SUTURE) IMPLANT
SUT PROLENE 0 CT 2 (SUTURE) IMPLANT
SUT VIC AB 0 CT1 27 (SUTURE) ×1
SUT VIC AB 0 CT1 27XBRD ANBCTR (SUTURE) ×2 IMPLANT
SUT VIC AB 1 CT1 27 (SUTURE) ×1
SUT VIC AB 1 CT1 27XBRD ANBCTR (SUTURE) ×2 IMPLANT
SUT VIC AB 2-0 CT1 27 (SUTURE) ×1
SUT VIC AB 2-0 CT1 TAPERPNT 27 (SUTURE) ×2 IMPLANT
SYR 20ML ECCENTRIC (SYRINGE) IMPLANT
TOWEL GREEN STERILE (TOWEL DISPOSABLE) ×2 IMPLANT
TOWEL GREEN STERILE FF (TOWEL DISPOSABLE) ×1 IMPLANT
TRAY FOLEY MTR SLVR 16FR STAT (SET/KITS/TRAYS/PACK) IMPLANT
WASHER FOR 5.0 SCREWS (Washer) IMPLANT
WATER STERILE IRR 1000ML POUR (IV SOLUTION) ×2 IMPLANT

## 2022-08-30 NOTE — Brief Op Note (Signed)
08/30/2022  1:24 PM  PATIENT:  Harold Smith  56 y.o. male  PRE-OPERATIVE DIAGNOSIS:  DISTAL FEMUR  POST-OPERATIVE DIAGNOSIS:  DISTAL FEMUR  PROCEDURE:  Procedure(s): OPEN REDUCTION INTERNAL FIXATION (ORIF) DISTAL FEMUR FRACTURE (Right) MEDIAL CONDYLE  SURGEON:  Surgeon(s) and Role:    Altamese , MD - Primary  PHYSICIAN ASSISTANT: Ainsley Spinner, PA-C  [phone]

## 2022-08-30 NOTE — Transfer of Care (Signed)
Immediate Anesthesia Transfer of Care Note  Patient: Harold Smith  Procedure(s) Performed: OPEN REDUCTION INTERNAL FIXATION (ORIF) DISTAL FEMUR FRACTURE (Right)  Patient Location: PACU  Anesthesia Type:General  Level of Consciousness: oriented, drowsy and patient cooperative  Airway & Oxygen Therapy: Patient Spontanous Breathing and Patient connected to nasal cannula oxygen  Post-op Assessment: Report given to RN and Post -op Vital signs reviewed and stable  Post vital signs: Reviewed  Last Vitals:  Vitals Value Taken Time  BP 142/98 08/30/22 1337  Temp    Pulse 128 08/30/22 1342  Resp 17 08/30/22 1342  SpO2 97 % 08/30/22 1342  Vitals shown include unvalidated device data.  Last Pain:  Vitals:   08/30/22 1033  TempSrc: Oral  PainSc: 7       Patients Stated Pain Goal: 3 (64/84/72 0721)  Complications: No notable events documented.

## 2022-08-30 NOTE — Op Note (Signed)
Harold Smith, Harold Smith MEDICAL RECORD NO: [id number] ACCOUNT NO: [id number] DATE OF BIRTH: June 20, 1966 FACILITY: [facility] LOCATION: [facility] PHYSICIAN: Astrid Divine. Florentina Marquart, MD  Operative Report   DATE OF PROCEDURE: 08/30/2022  PREOPERATIVE DIAGNOSIS:  Right distal femur fracture with intra-articular extension involving the medial condyle.  POSTOPERATIVE DIAGNOSIS:  Right distal femur fracture with intra-articular extension involving the medial condyle.  PROCEDURE:  Open reduction internal fixation of right distal femur intra-articular medial condyle fracture.  SURGEON:  Astrid Divine. Marcelino Scot, MD.  ASSISTANT:  Ainsley Spinner, PA-C.  ANESTHESIA:  General.  COMPLICATIONS:  None.  TOURNIQUET:  None.  SPECIMENS:  None.  ESTIMATED BLOOD LOSS:  30 mL  DISPOSITION:  To PACU.  CONDITION:  Stable.  BRIEF SUMMARY AND INDICATIONS FOR PROCEDURE:  The patient is a 56 year old male who sustained a low energy injury to the right knee resulting in a displaced fracture of the medial condyle with a shear and intraarticular displacement.  I discussed with  the patient preoperatively the risks and benefits of surgery including the possibility of infection, nerve injury, vessel injury, DVT, PE, loss of motion, arthritis, malunion, nonunion, and symptomatic hardware, many of which could necessitate a  subsequent surgery.  The patient acknowledged these risks and did provide consent to proceed.  BRIEF SUMMARY OF THE PROCEDURE:  The patient was taken to the operating room where general anesthesia was induced.  The right lower extremity was prepped and draped in the usual sterile fashion using first a chlorhexidine wash, then Betadine scrub and  paint.  Timeout was held.  C-arm was brought in to localize the best area to make a medial incision.  This was checked on AP and lateral views.  A 6 cm longitudinal incision was then made over the superior aspect of the medial condyle.  Dissection was  carried carefully down to  the saphenous nerve was identified and retracted to protect it.  The deep retinaculum was incised.  I was then able to place the plate directly on the bone and with the help of my assistant who pulled traction and valgus stress  placed a 3.5 screw in a custom contoured recon plate.  This produced a buttress effect pushing the medial condyle distally and opposing it to the distal femur.  Additional screw was placed above that and then in back and forth fashion these were  tightened to maximal effect and compression.  Then, 2 threaded guidewires were placed from medial to lateral, getting sufficient spread in the condyles and using washers and partially threaded screws.  Additional compression and fixation was obtained.   Final images consisting of AP and lateral views were obtained showing excellent reduction, acceptable hardware placement and length.  Wound was irrigated thoroughly, closed in standard layered fashion using #1 Vicryl, 2-0 Vicryl and 2-0 nylon for the  skin.  A sterile, gently compressive dressing was applied and a knee immobilizer.  The patient was taken to the PACU in stable condition.  Ainsley Spinner, PA-C, was present and assisting throughout.  Assistant was necessary to produce necessary force for  reduction and also assisted with a wound closure.  PROGNOSIS:  The patient will have unrestricted range of motion of the knee with supplemental hinged knee brace for precautionary support.  Weightbearing will be delayed for 6 weeks when we will anticipate progressing.  He will be on formal DVT  prophylaxis and a metabolic bone workup is ongoing at this time.   PUS D: 08/30/2022 1:32:41 pm T: 08/30/2022 1:55:00 pm  JOB:  [id number]/ [id number]  

## 2022-08-30 NOTE — Anesthesia Procedure Notes (Addendum)
Procedure Name: Intubation Date/Time: 08/30/2022 11:29 AM  Performed by: Jenne Campus, CRNAPre-anesthesia Checklist: Patient identified, Emergency Drugs available, Suction available and Patient being monitored Patient Re-evaluated:Patient Re-evaluated prior to induction Oxygen Delivery Method: Circle System Utilized Preoxygenation: Pre-oxygenation with 100% oxygen Induction Type: IV induction Ventilation: Mask ventilation without difficulty and Oral airway inserted - appropriate to patient size Laryngoscope Size: Sabra Heck and 3 Grade View: Grade I Tube type: Oral Tube size: 7.5 mm Number of attempts: 1 Airway Equipment and Method: Stylet and Oral airway Placement Confirmation: ETT inserted through vocal cords under direct vision, positive ETCO2 and breath sounds checked- equal and bilateral Secured at: 22 cm Tube secured with: Tape Dental Injury: Teeth and Oropharynx as per pre-operative assessment

## 2022-08-30 NOTE — Progress Notes (Signed)
I discussed with the patient the risks and benefits of surgery for right distal femur fracture repair, including the possibility of infection, nerve injury, vessel injury, wound breakdown, arthritis, symptomatic hardware, DVT/ PE, loss of motion, malunion, nonunion, and need for further surgery among others. He acknowledged these risks and wished to proceed.  Altamese Pembroke, MD [facility], [facility] [phone]

## 2022-08-30 NOTE — Progress Notes (Signed)
Orthopedic Tech Progress Note Patient Details:  Wang Granada 06-23-1966 [id number]  Called In order to [facility] for a ROM BRACE   Patient ID: Jemuel Laursen, male   DOB: [birth year], 56 y.o.   MRN: [id number]  Janit Pagan 08/30/2022, 5:25 PM

## 2022-08-30 NOTE — Anesthesia Preprocedure Evaluation (Addendum)
Anesthesia Evaluation  Patient identified by MRN, date of birth, ID band Patient awake    Reviewed: Allergy & Precautions, NPO status , Patient's Chart, lab work & pertinent test results  Airway Mallampati: II  TM Distance: >3 FB Neck ROM: Full    Dental no notable dental hx. (+) Teeth Intact, Dental Advisory Given   Pulmonary sleep apnea (doesnt use CPAP) , Current Smoker and Patient abstained from smoking.,    Pulmonary exam normal breath sounds clear to auscultation       Cardiovascular hypertension (per pt no hx of HTN but has been significantly elevated this admission, 166/100 in preop), Normal cardiovascular exam Rhythm:Regular Rate:Normal     Neuro/Psych PSYCHIATRIC DISORDERS Anxiety negative neurological ROS     GI/Hepatic Neg liver ROS, PUD, S/p roux en y 2015   Endo/Other  negative endocrine ROS  Renal/GU negative Renal ROS  negative genitourinary   Musculoskeletal  (+) Arthritis , Osteoarthritis,  Distal femur fx   Abdominal   Peds  Hematology  (+) Blood dyscrasia, anemia , REFUSES BLOOD PRODUCTS, 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, plt 239   Anesthesia Other Findings   Reproductive/Obstetrics negative OB ROS                           Anesthesia Physical Anesthesia Plan  ASA: 3  Anesthesia Plan: General and Regional   Post-op Pain Management: Regional block*, Tylenol PO (pre-op)* and Toradol IV (intra-op)*   Induction: Intravenous  PONV Risk Score and Plan: 2 and Ondansetron, Dexamethasone, Midazolam and Treatment may vary due to age or medical condition  Airway Management Planned: Oral ETT  Additional Equipment: None  Intra-op Plan:   Post-operative Plan: Extubation in OR  Informed Consent: I have reviewed the patients History and Physical, chart, labs and discussed the procedure including the risks, benefits and alternatives for the proposed anesthesia with the patient or  authorized representative who has indicated his/her understanding and acceptance.     Dental advisory given  Plan Discussed with: CRNA  Anesthesia Plan Comments:        Anesthesia Quick Evaluation

## 2022-08-30 NOTE — Anesthesia Procedure Notes (Signed)
Anesthesia Regional Block: Adductor canal block   Pre-Anesthetic Checklist: , timeout performed,  Correct Patient, Correct Site, Correct Laterality,  Correct Procedure, Correct Position, site marked,  Risks and benefits discussed,  Surgical consent,  Pre-op evaluation,  At surgeon's request and post-op pain management  Laterality: Right  Prep: Maximum Sterile Barrier Precautions used, chloraprep       Needles:  Injection technique: Single-shot  Needle Type: Echogenic Stimulator Needle     Needle Length: 9cm  Needle Gauge: 22     Additional Needles:   Procedures:,,,, ultrasound used (permanent image in chart),,    Narrative:  Start time: 08/30/2022 10:50 AM End time: 08/30/2022 10:55 AM Injection made incrementally with aspirations every 5 mL.  Performed by: Personally  Anesthesiologist: Pervis Hocking, DO  Additional Notes: Monitors applied. No increased pain on injection. No increased resistance to injection. Injection made in 5cc increments. Good needle visualization. Patient tolerated procedure well.

## 2022-08-30 NOTE — TOC CM/SW Note (Signed)
  Transition of Care [facility]) Screening Note   Patient Details  Name: Harold Smith Date of Birth: 12/08/1966     Transition of Care Department [facility]) has reviewed patient and no TOC needs have been identified at this time. We will continue to monitor patient advancement through interdisciplinary progression rounds. If new patient transition needs arise, please place a TOC consult.

## 2022-08-31 ENCOUNTER — Inpatient Hospital Stay: Payer: 59

## 2022-08-31 ENCOUNTER — Encounter (HOSPITAL_COMMUNITY): Payer: Self-pay | Admitting: Orthopedic Surgery

## 2022-08-31 LAB — CBC
HCT: 30.9 % — ABNORMAL LOW (ref 39.0–52.0)
Hemoglobin: 10.2 g/dL — ABNORMAL LOW (ref 13.0–17.0)
MCH: 29.7 pg (ref 26.0–34.0)
MCHC: 33 g/dL (ref 30.0–36.0)
MCV: 90.1 fL (ref 80.0–100.0)
Platelets: 210 10*3/uL (ref 150–400)
RBC: 3.43 MIL/uL — ABNORMAL LOW (ref 4.22–5.81)
RDW: 20.2 % — ABNORMAL HIGH (ref 11.5–15.5)
WBC: 11.5 10*3/uL — ABNORMAL HIGH (ref 4.0–10.5)
nRBC: 0 % (ref 0.0–0.2)

## 2022-08-31 LAB — TSH: TSH: 0.308 u[IU]/mL — ABNORMAL LOW (ref 0.350–4.500)

## 2022-08-31 NOTE — Evaluation (Signed)
Physical Therapy Evaluation Patient Details Name: Harold Smith MRN: [id number] DOB: March 30, 1966 Today's Date: 08/31/2022  History of Present Illness  56 y/o male presented to ED on 08/28/22 after knee giving out and inability to walk. Sustained comminuted closed R supracondylar femur fx with intraarticular extension. S/p ORIF R distal femur fx on 9/5. PMH: gastric bypass, sleep apnea  Clinical Impression  Patient admitted with the above. PTA, patient lives alone and was independent but limited by chronic back pain. Patient has friends arranged to assist at discharge. Patient presents with weakness, impaired balance, and decreased activity tolerance. Patient overall at supervision level with RW for all mobility with good ability to maintain NWB. Discussed and instructed patient on HEP for patient to perform while in hospital and at discharge. Patient will benefit from skilled PT services during acute stay to address listed deficits. No PT follow up recommended at this time, but will benefit from OPPT once cleared to [facility].        Recommendations for follow up therapy are one component of a multi-disciplinary discharge planning process, led by the attending physician.  Recommendations may be updated based on patient status, additional functional criteria and insurance authorization.  Follow Up Recommendations No PT follow up (will benefit from OPPT once cleared to [facility])      Assistance Recommended at Discharge PRN  Patient can return home with the following       Equipment Recommendations Rolling Sian Joles (2 wheels);BSC/3in1  Recommendations for Other Services       Functional Status Assessment Patient has had a recent decline in their functional status and demonstrates the ability to make significant improvements in function in a reasonable and predictable amount of time.     Precautions / Restrictions Precautions Precautions: Fall Required Braces or Orthoses: Other Brace Other Brace: hinged  knee brace Restrictions Weight Bearing Restrictions: Yes RLE Weight Bearing: Non weight bearing      Mobility  Bed Mobility               General bed mobility comments: in recliner on arrival    Transfers Overall transfer level: Needs assistance Equipment used: Rolling Rosaline Ezekiel (2 wheels) Transfers: Sit to/from Stand Sit to Stand: Supervision                Ambulation/Gait Ambulation/Gait assistance: Supervision Gait Distance (Feet): 200 Feet Assistive device: Rolling Kynzli Rease (2 wheels) Gait Pattern/deviations: Step-to pattern ("hop to") Gait velocity: decreased     General Gait Details: good ability to maintain NWB on R throughout session  Stairs Stairs:  (Discussed stair negotiation with patient with patient able to verbalize correctly how to navigate stairs)          Wheelchair Mobility    Modified Rankin (Stroke Patients Only)       Balance Overall balance assessment: Needs assistance Sitting-balance support: No upper extremity supported, Feet supported Sitting balance-Leahy Scale: Normal     Standing balance support: No upper extremity supported, During functional activity, Bilateral upper extremity supported Standing balance-Leahy Scale: Fair                               Pertinent Vitals/Pain Pain Assessment Pain Assessment: Faces Faces Pain Scale: Hurts little more Pain Location: R knee Pain Descriptors / Indicators: Grimacing Pain Intervention(s): Limited activity within patient's tolerance, Monitored during session    Home Living Family/patient expects to be discharged to:: Private residence Living Arrangements: Alone Available Help at  Discharge: Family;Available PRN/intermittently;Friend(s) Type of Home: House Home Access: Stairs to enter   [employer] of Steps: 1 + threshold through front door, one step through garage entrance   Home Layout: Two level;Able to live on main level with  bedroom/bathroom Home Equipment: Kasandra Knudsen - single point;Other (comment) (tiki bench for shower) Additional Comments: Pt has various family/friends set up to assist at DC    Prior Function Prior Level of Function : Independent/Modified Independent;Driving             Mobility Comments: no use of AD typically but limited by back pain, was pending back sx this month ADLs Comments: Independent in all tasks, has not been working for past year d/t back issues. Enjoys sports, exercising     Hand Dominance   Dominant Hand: Right    Extremity/Trunk Assessment   Upper Extremity Assessment Upper Extremity Assessment: Defer to OT evaluation    Lower Extremity Assessment Lower Extremity Assessment: RLE deficits/detail RLE Deficits / Details: post op pain and weakness from ORIF    Cervical / Trunk Assessment Cervical / Trunk Assessment: Normal  Communication   Communication: No difficulties  Cognition Arousal/Alertness: Awake/alert Behavior During Therapy: WFL for tasks assessed/performed Overall Cognitive Status: Within Functional Limits for tasks assessed                                          General Comments      Exercises Other Exercises Other Exercises: Instructed patient on LAQ, seated marching, hip abduction/adduction, SLR, heel slides, quad sets   Assessment/Plan    PT Assessment Patient needs continued PT services  PT Problem List Decreased strength;Decreased activity tolerance;Decreased range of motion;Decreased balance;Decreased mobility       PT Treatment Interventions Gait training;DME instruction;Functional mobility training;Therapeutic activities;Therapeutic exercise;Balance training;Patient/family education    PT Goals (Current goals can be found in the Care Plan section)  Acute Rehab PT Goals Patient Stated Goal: to get better and go home PT Goal Formulation: With patient Time For Goal Achievement: 09/14/22 Potential to Achieve Goals:  Good    Frequency Min 3X/week     Co-evaluation               AM-PAC PT "6 Clicks" Mobility  Outcome Measure Help needed turning from your back to your side while in a flat bed without using bedrails?: None Help needed moving from lying on your back to sitting on the side of a flat bed without using bedrails?: None Help needed moving to and from a bed to a chair (including a wheelchair)?: A Little Help needed standing up from a chair using your arms (e.g., wheelchair or bedside chair)?: A Little Help needed to walk in hospital room?: A Little Help needed climbing 3-5 steps with a railing? : A Little 6 Click Score: 20    End of Session Equipment Utilized During Treatment: Other (comment) (R hinge brace) Activity Tolerance: Patient tolerated treatment well Patient left: in chair;with call bell/phone within reach Nurse Communication: Mobility status PT Visit Diagnosis: Muscle weakness (generalized) (M62.81);Other abnormalities of gait and mobility (R26.89)    Time: 2831-5176 PT Time Calculation (min) (ACUTE ONLY): 44 min   Charges:   PT Evaluation $PT Eval Low Complexity: 1 Low PT Treatments $Therapeutic Exercise: 8-22 mins $Therapeutic Activity: 8-22 mins        Burch [place]uk A. Gilford Rile PT, DPT Acute Rehabilitation Services Office [phone]  Jarion Hawthorne A Timeka Goette 08/31/2022, 12:54 PM

## 2022-08-31 NOTE — Progress Notes (Signed)
Orthopaedic Trauma Service Progress Note  Patient ID: Harold Smith MRN: [id number] DOB/AGE: Aug 27, 1966 56 y.o.  Subjective:  Feeling much better Pain controlled No complaints Mobilizing to chair with therapy   Good appetite + void  + flatus  BPs elevated   ROS As above  Objective:   VITALS:   Vitals:   08/30/22 1555 08/30/22 2222 08/31/22 0604 08/31/22 0841  BP: (!) 153/110 (!) 153/99 (!) 142/88 (!) 161/101  Pulse: 100 83 88 100  Resp: '16 17 17 18  '$ Temp: 97.7 F (36.5 C) 97.8 F (36.6 C) 97.7 F (36.5 C) 98.2 F (36.8 C)  TempSrc: Oral Oral Oral Oral  SpO2: 98% 98% 96% 98%    Estimated body mass index is 25.21 kg/m as calculated from the following:   Height as of 08/28/22: '5\' 11"'$  (1.803 m).   Weight as of 08/28/22: 82 kg.   Intake/Output      09/05 0701 09/06 0700 09/06 0701 09/07 0700   P.O.     I.V. 3465    IV Piggyback 610    Total Intake 4075    Urine 2200    Stool     Blood 25    Total Output 2225    Net +1850           LABS  Results for orders placed or performed during the hospital encounter of 08/29/22 (from the past 24 hour(s))  CBC     Status: Abnormal   Collection Time: 08/31/22 12:33 AM  Result Value Ref Range   WBC 11.5 (H) 4.0 - 10.5 K/uL   RBC 3.43 (L) 4.22 - 5.81 MIL/uL   Hemoglobin 10.2 (L) 13.0 - 17.0 g/dL   HCT 30.9 (L) 39.0 - 52.0 %   MCV 90.1 80.0 - 100.0 fL   MCH 29.7 26.0 - 34.0 pg   MCHC 33.0 30.0 - 36.0 g/dL   RDW 20.2 (H) 11.5 - 15.5 %   Platelets 210 150 - 400 K/uL   nRBC 0.0 0.0 - 0.2 %  TSH     Status: Abnormal   Collection Time: 08/31/22 12:33 AM  Result Value Ref Range   TSH 0.308 (L) 0.350 - 4.500 uIU/mL     PHYSICAL EXAM:   Gen: mobilizing well, looks good, no complaints Lungs: unlabored Cardiac:reg Ext:       Right Lower Extremity   Immobilizer and dressing intact  Ext warm   Distal motor and sensory functions  intact  No DCT   Swelling controlled  + DP pulse  Compartments are soft   Assessment/Plan: 1 Day Post-Op    Anti-infectives (From admission, onward)    Start     Dose/Rate Route Frequency Ordered Stop   08/30/22 0600  ceFAZolin (ANCEF) IVPB 2g/100 mL premix        2 g 200 mL/hr over 30 Minutes Intravenous On call to O.R. 08/29/22 1752 08/30/22 1147   08/29/22 1500  ceFAZolin (ANCEF) IVPB 2g/100 mL premix  Status:  Discontinued        2 g 200 mL/hr over 30 Minutes Intravenous On call to O.R. 08/29/22 1014 08/29/22 1811     .  POD/HD#: 1  56 y/o male ground level fall with R distal medial femoral condyle fracture    -R distal medial femoral condyle fracture, fragility  fracture s/p ORIF                  NWB x 6 weeks               Unrestricted ROM                Hinged brace    PT/OT                               Ice and elevate   - Pain management:              multimodal    - ABL anemia/Hemodynamics               stable    Minimal blood loss in OR    - Medical issues                Chronic conditions appear stable   HTN   Increase HCTZ to 25 mg daily    I have discussed with PCP    - DVT/PE prophylaxis:               Lovenox post op    - ID:                Periop abx   - Metabolic Bone Disease:               vitamin d deficiency    Supplement                  Suspicious for osteoporsis                 Recommend dexa as an outpatient    Malabsorption due to gastric bypass?   - Activity:               NWB R leg o/w as tolerated    - FEN/GI prophylaxis/Foley/Lines:               reg diet                 - Impediments to fracture healing:               Poor bone quality                History of gastric bypass     Vitamin d deficiency   - Dispo:              continue with therapies   Likely home tomorrow    Jari Pigg, PA-C [phone] (C) 08/31/2022, 8:42 AM  [facility] [place]  [phone] Jenetta [phone] (F)    After 5pm and on the weekends please log on to Amion, go to orthopaedics and the look under the [facility] Call for the provider(s) on call. You can also call our office at [phone] and then follow the prompts to be connected to the call team.   Patient ID: Harold Smith, male   DOB: 12-15-1966, 56 y.o.   MRN: [id number]

## 2022-08-31 NOTE — Evaluation (Signed)
Occupational Therapy Evaluation Patient Details Name: Harold Smith MRN: [id number] DOB: Feb 22, 1966 Today's Date: 08/31/2022   History of Present Illness 56 y/o male presented to ED on 08/28/22 after knee giving out and inability to walk. Sustained comminuted closed R supracondylar femur fx with intraarticular extension. S/p ORIF R distal femur fx on 9/5. PMH: gastric bypass, sleep apnea   Clinical Impression   PTA, pt lives alone, typically Independent in all daily tasks without AD though limited by chronic back pain. Pt presents now with expected post op pain and deficits in dynamic standing balance d/t NWB precautions. However, pt moving fairly well. Overall, pt able to mobilize to/from bathroom using RW with Supervision, Setup for UB ADLs and min guard for LB ADLs. Educated re: strategies for LB ADLs, tub transfer/shower chair options, and use of BSC over toilet at home d/t difficulty standing from low toilet. Pt reports various friend/family assistance set up at DC. Plan to follow acutely for further tub transfer practice though pt functionally appropriate for DC once deemed medically stable.       Recommendations for follow up therapy are one component of a multi-disciplinary discharge planning process, led by the attending physician.  Recommendations may be updated based on patient status, additional functional criteria and insurance authorization.   Follow Up Recommendations  No OT follow up    Assistance Recommended at Discharge PRN  Patient can return home with the following A little help with bathing/dressing/bathroom;Assistance with cooking/housework;Assist for transportation;Help with stairs or ramp for entrance    Functional Status Assessment  Patient has had a recent decline in their functional status and demonstrates the ability to make significant improvements in function in a reasonable and predictable amount of time.  Equipment Recommendations  BSC/3in1;Other (comment)  (RW)    Recommendations for Other Services       Precautions / Restrictions Precautions Precautions: Fall Required Braces or Orthoses: Other Brace Other Brace: hinged knee brace Restrictions Weight Bearing Restrictions: Yes RLE Weight Bearing: Non weight bearing      Mobility Bed Mobility Overal bed mobility: Modified Independent             General bed mobility comments: use of BUE to support RLE to EOB    Transfers Overall transfer level: Needs assistance Equipment used: Rolling walker (2 wheels) Transfers: Sit to/from Stand Sit to Stand: Supervision           General transfer comment: minor cues for tehcniw      Balance Overall balance assessment: Needs assistance Sitting-balance support: No upper extremity supported, Feet supported Sitting balance-Leahy Scale: Normal     Standing balance support: No upper extremity supported, During functional activity, Bilateral upper extremity supported Standing balance-Leahy Scale: Fair Standing balance comment: able to stand briefly on LLE without support. BUE for mobility needed d/t WB precautions                           ADL either performed or assessed with clinical judgement   ADL Overall ADL's : Needs assistance/impaired Eating/Feeding: Independent   Grooming: Set up;Standing;Wash/dry hands   Upper Body Bathing: Set up;Sitting   Lower Body Bathing: Min guard;Sit to/from stand Lower Body Bathing Details (indicate cue type and reason): minor cues for WB while pt stood for washing peri region at sink Upper Body Dressing : Set up;Sitting   Lower Body Dressing: Min guard;Sitting/lateral leans;Sit to/from stand Lower Body Dressing Details (indicate cue type and reason): educated on  strategies for LB dressing, wearing easier clothing to manage at home with pt able to don underwear/shorts in recliner with lateral leans Toilet Transfer: Supervision/safety;Ambulation;Rolling walker (2  wheels);BSC/3in1 Toilet Transfer Details (indicate cue type and reason): BSC over toilet to increase height and ease of transfers Toileting- Clothing Manipulation and Hygiene: Supervision/safety;Sit to/from stand;Sitting/lateral lean       Functional mobility during ADLs: Supervision/safety;Rolling walker (2 wheels) General ADL Comments: Discussed helpful DME with BSC over toilet, RW for around the house and tub transfer strategies/barriers. pt reports plan to have some assistance for in/out of shower - does have tiki bench he can use or reported he may go look for shower chairs outside of hospital     Vision Baseline Vision/History: 0 No visual deficits Ability to See in Adequate Light: 0 Adequate Patient Visual Report: No change from baseline Vision Assessment?: No apparent visual deficits     Perception     Praxis      Pertinent Vitals/Pain Pain Assessment Pain Assessment: Faces Faces Pain Scale: Hurts little more Pain Location: R knee Pain Descriptors / Indicators: Grimacing Pain Intervention(s): Monitored during session     Hand Dominance Right   Extremity/Trunk Assessment Upper Extremity Assessment Upper Extremity Assessment: Overall WFL for tasks assessed   Lower Extremity Assessment Lower Extremity Assessment: Defer to PT evaluation   Cervical / Trunk Assessment Cervical / Trunk Assessment: Normal   Communication Communication Communication: No difficulties   Cognition Arousal/Alertness: Awake/alert Behavior During Therapy: WFL for tasks assessed/performed Overall Cognitive Status: Within Functional Limits for tasks assessed                                       General Comments  Hinged brace not yet delivered so session completed conservatively with [employer] rep entering at end of OT session.    Exercises     Shoulder Instructions      Home Living Family/patient expects to be discharged to:: Private residence Living Arrangements:  Alone Available Help at Discharge: Family;Available PRN/intermittently;Friend(s) Type of Home: House Home Access: Stairs to enter [employer] of Steps: 1 + threshold through front door, one step through garage entrance   Home Layout: Two level;Able to live on main level with bedroom/bathroom     Bathroom Shower/Tub: Teacher, early years/pre: Standard (low in Surveyor, quantity)     Home Equipment: [place] - single point;Other (comment) (tiki bench for shower)   Additional Comments: Pt has various family/friends set up to assist at DC      Prior Functioning/Environment Prior Level of Function : Independent/Modified Independent;Driving             Mobility Comments: no use of AD typically but limited by back pain, was pending back sx this month ADLs Comments: Independent in all tasks, has not been working for past year d/t back issues. Enjoys sports, exercising        OT Problem List: Decreased knowledge of use of DME or AE;Decreased knowledge of precautions;Pain      OT Treatment/Interventions: Self-care/ADL training;Therapeutic exercise;DME and/or AE instruction;Therapeutic activities    OT Goals(Current goals can be found in the care plan section) Acute Rehab OT Goals Patient Stated Goal: recover well at home, have back surgery OT Goal Formulation: With patient Time For Goal Achievement: 09/14/22 Potential to Achieve Goals: Good  OT Frequency: Min 2X/week    Co-evaluation  AM-PAC OT "6 Clicks" Daily Activity     Outcome Measure Help from another person eating meals?: None Help from another person taking care of personal grooming?: A Little Help from another person toileting, which includes using toliet, bedpan, or urinal?: A Little Help from another person bathing (including washing, rinsing, drying)?: A Little Help from another person to put on and taking off regular upper body clothing?: A Little Help from another person to put  on and taking off regular lower body clothing?: A Little 6 Click Score: 19   End of Session Equipment Utilized During Treatment: Rolling walker (2 wheels) Nurse Communication: Mobility status;Other (comment);Precautions;Weight bearing status (IV beeping)  Activity Tolerance: Patient tolerated treatment well Patient left: in chair;with call bell/phone within reach  OT Visit Diagnosis: Other abnormalities of gait and mobility (R26.89)                Time: 3428-7681 OT Time Calculation (min): 36 min Charges:  OT General Charges $OT Visit: 1 Visit OT Evaluation $OT Eval Low Complexity: 1 Low OT Treatments $Self Care/Home Management : 8-22 mins  Malachy Chamber, OTR/L Acute Rehab Services Office: [phone]   Layla Maw 08/31/2022, 9:33 AM

## 2022-08-31 NOTE — TOC Initial Note (Signed)
Transition of Care [facility]) - Initial/Assessment Note    Patient Details  Name: Harold Smith MRN: [id number] Date of Birth: Aug 20, 1966  Transition of Care [facility]) CM/SW Contact:    Carles Collet, RN Phone Number: 08/31/2022, 12:02 PM  Clinical Narrative:                  Spoke to patient over the phone. He states that he has been out of work since October last year. He has insurance through [employer]. Discussed his benefits, he states he does not have home health benefits, this is not unusual for [place] [employer]. We discussed asking PT for HEP, or printouts of recommended exercises if needed for DC, He states he has family support and feels safe discharging to home. We discussed DME needs. RW and BSC will be delivered to the room in the morning.  Patient will have ride home. He had concerns about hospital bill. Once bill is processed through his insurance he can call billing department. Instructed to call when he gets his hospital bill in the mail to apply for financial assistance.     Expected Discharge Plan: Home/Self Care Barriers to Discharge: Continued Medical Work up   Patient Goals and CMS Choice Patient states their goals for this hospitalization and ongoing recovery are:: to go home   Choice offered to / list presented to : NA  Expected Discharge Plan and Services Expected Discharge Plan: Home/Self Care   Discharge Planning Services: CM Consult Post Acute Care Choice: Durable Medical Equipment (home excercise plan if applicable from PT) Living arrangements for the past 2 months: Single Family Home                 DME Arranged: Bedside commode, Walker rolling DME Agency: [employer] Date DME Agency Contacted: 08/31/22 Time DME Agency Contacted: [phone]   [place] Arranged: NA          Prior Living Arrangements/Services Living arrangements for the past 2 months: Single Family Home Lives with:: Relatives   Do you feel safe going back to the place where you live?:  Yes               Activities of Daily Living      Permission Sought/Granted                  Emotional Assessment              Admission diagnosis:  Displaced supracondylar fracture of distal end of right femur without intracondylar extension ([place]) [S72.451A] Patient Active Problem List   Diagnosis Date Noted   Displaced supracondylar fracture of distal end of right femur without intracondylar extension ([place]) 08/29/2022   Mood disorder ([place]) 07/08/2022   Erosive esophagitis    Disability of walking 06/01/2022   Pain in joint involving ankle and foot 06/01/2022   Degeneration of lumbar intervertebral disc 06/01/2022   Iron deficiency anemia 05/10/2022   Microcytic anemia 05/04/2022   Numbness and tingling 04/18/2022   Imbalance 04/18/2022   Difficulty sleeping 04/18/2022   Lumbar spondylolysis 02/25/2022   Influenza A 10/26/2021   Sleep disturbance 07/02/2021   Elevated blood pressure reading 10/14/2019   Obstructive sleep apnea 05/28/2019   Preventative health care 06/23/2018   Personal hx of gastric bypass 06/23/2018   Erectile dysfunction 06/23/2018   History of colonic polyps    Rectal bleeding 12/22/2015   Diverticulosis large intestine w/o perforation or abscess w/bleeding    Colon polyps    PCP:  Viviana Simpler  I, MD Pharmacy:   [place], [place] - [address] [address] Phone: [phone] Fax: [phone]     Social Determinants of Health (SDOH) Interventions    Readmission Risk Interventions     No data to display

## 2022-08-31 NOTE — Anesthesia Postprocedure Evaluation (Signed)
Anesthesia Post Note  Patient: Harold Smith  Procedure(s) Performed: OPEN REDUCTION INTERNAL FIXATION (ORIF) DISTAL FEMUR FRACTURE (Right)     Patient location during evaluation: PACU Anesthesia Type: Regional and General Level of consciousness: awake and alert, oriented and patient cooperative Pain management: pain level controlled Vital Signs Assessment: post-procedure vital signs reviewed and stable Respiratory status: spontaneous breathing, nonlabored ventilation and respiratory function stable Cardiovascular status: blood pressure returned to baseline and stable Postop Assessment: no apparent nausea or vomiting Anesthetic complications: no   No notable events documented.  Last Vitals:  Vitals:   08/31/22 0604 08/31/22 0841  BP: (!) 142/88 (!) 161/101  Pulse: 88 100  Resp: 17 18  Temp: 36.5 C 36.8 C  SpO2: 96% 98%    Last Pain:  Vitals:   08/31/22 0841  TempSrc: Oral  PainSc:                  Pervis Hocking

## 2022-09-01 ENCOUNTER — Other Ambulatory Visit (HOSPITAL_COMMUNITY): Payer: Self-pay

## 2022-09-01 ENCOUNTER — Encounter: Payer: Self-pay | Admitting: Oncology

## 2022-09-01 ENCOUNTER — Encounter (HOSPITAL_COMMUNITY): Payer: Self-pay | Admitting: Orthopedic Surgery

## 2022-09-01 DIAGNOSIS — I10 Essential (primary) hypertension: Secondary | ICD-10-CM | POA: Diagnosis not present

## 2022-09-01 DIAGNOSIS — M898X9 Other specified disorders of bone, unspecified site: Secondary | ICD-10-CM

## 2022-09-01 DIAGNOSIS — F419 Anxiety disorder, unspecified: Secondary | ICD-10-CM

## 2022-09-01 DIAGNOSIS — E559 Vitamin D deficiency, unspecified: Secondary | ICD-10-CM | POA: Diagnosis present

## 2022-09-01 HISTORY — DX: Other specified disorders of bone, unspecified site: M89.8X9

## 2022-09-01 HISTORY — DX: Vitamin D deficiency, unspecified: E55.9

## 2022-09-01 LAB — TESTOSTERONE: Testosterone: 411 ng/dL (ref 264–916)

## 2022-09-01 LAB — PTH, INTACT AND CALCIUM
Calcium, Total (PTH): 7.9 mg/dL — ABNORMAL LOW (ref 8.7–10.2)
PTH: 36 pg/mL (ref 15–65)

## 2022-09-01 LAB — SEX HORMONE BINDING GLOBULIN: Sex Hormone Binding: 57.9 nmol/L (ref 19.3–76.4)

## 2022-09-01 MED ORDER — LISINOPRIL 20 MG PO TABS
20.0000 mg | ORAL_TABLET | Freq: Every day | ORAL | Status: DC
Start: 2022-09-01 — End: 2022-09-02
  Administered 2022-09-01 – 2022-09-02 (×2): 20 mg via ORAL
  Filled 2022-09-01 (×2): qty 1

## 2022-09-01 MED ORDER — AMLODIPINE BESYLATE 5 MG PO TABS
5.0000 mg | ORAL_TABLET | Freq: Every day | ORAL | Status: DC
Start: 2022-09-01 — End: 2022-09-01
  Administered 2022-09-01: 5 mg via ORAL
  Filled 2022-09-01: qty 1

## 2022-09-01 MED ORDER — LISINOPRIL 20 MG PO TABS: 20.0000 mg | ORAL_TABLET | Freq: Every day | ORAL | 0 refills | Status: DC

## 2022-09-01 MED ORDER — ZINC SULFATE 220 (50 ZN) MG PO TABS
220.0000 mg | ORAL_TABLET | Freq: Every day | ORAL | 1 refills | Status: DC
  Filled 2022-09-01: qty 30, 30d supply, fill #0
  Filled 2022-09-27: qty 30, 30d supply, fill #1
  Filled 2022-12-03: qty 30, 30d supply, fill #0

## 2022-09-01 MED ORDER — BLOOD PRESSURE MONITOR KIT: 1.0000 | PACK | Freq: Once | 0 refills | Status: AC

## 2022-09-01 MED ORDER — CHOLECALCIFEROL 125 MCG (5000 UT) PO TABS
ORAL_TABLET | Freq: Every day | ORAL | 6 refills | Status: DC
  Filled 2022-09-01: qty 30, 30d supply, fill #0
  Filled 2022-09-27: qty 30, 30d supply, fill #1
  Filled 2022-12-03: qty 100, 100d supply, fill #0

## 2022-09-01 MED ORDER — POTASSIUM CHLORIDE CRYS ER 20 MEQ PO TBCR
40.0000 meq | EXTENDED_RELEASE_TABLET | Freq: Once | ORAL | Status: DC
Start: 2022-09-01 — End: 2022-09-01
  Filled 2022-09-01: qty 2

## 2022-09-01 MED ORDER — AMLODIPINE BESYLATE 5 MG PO TABS
5.0000 mg | ORAL_TABLET | Freq: Every day | ORAL | 0 refills | Status: DC
  Filled 2022-09-01: qty 30, 30d supply, fill #0

## 2022-09-01 MED ORDER — MELOXICAM 15 MG PO TABS
15.0000 mg | ORAL_TABLET | Freq: Every day | ORAL | 3 refills | Status: DC | PRN
Start: 2022-10-09 — End: 2023-03-11

## 2022-09-01 MED ORDER — RIVAROXABAN 15 MG PO TABS
15.0000 mg | ORAL_TABLET | Freq: Every day | ORAL | 0 refills | Status: DC
  Filled 2022-09-01: qty 30, 30d supply, fill #0

## 2022-09-01 MED ORDER — HYDROCHLOROTHIAZIDE 25 MG PO TABS
25.0000 mg | ORAL_TABLET | Freq: Every day | ORAL | 0 refills | Status: DC
Start: 2022-09-02 — End: 2022-09-29
  Filled 2022-09-01: qty 30, 30d supply, fill #0

## 2022-09-01 MED ORDER — ERGOCALCIFEROL 1.25 MG (50000 UT) PO CAPS
50000.0000 [IU] | ORAL_CAPSULE | ORAL | 2 refills | Status: DC
Start: 2022-09-05 — End: 2023-02-20
  Filled 2022-09-01: qty 4, 28d supply, fill #0
  Filled 2022-09-27: qty 4, 28d supply, fill #1
  Filled 2022-09-29: qty 4, 28d supply, fill #0

## 2022-09-01 MED ORDER — OXYCODONE-ACETAMINOPHEN 5-325 MG PO TABS
1.0000 | ORAL_TABLET | Freq: Four times a day (QID) | ORAL | 0 refills | Status: DC | PRN
  Filled 2022-09-01: qty 50, 7d supply, fill #0

## 2022-09-01 MED ORDER — DOCUSATE SODIUM 100 MG PO CAPS
100.0000 mg | ORAL_CAPSULE | Freq: Two times a day (BID) | ORAL | 0 refills | Status: DC
  Filled 2022-09-01: qty 10, 5d supply, fill #0

## 2022-09-01 NOTE — Plan of Care (Signed)
  Problem: Education: Goal: Knowledge of General Education information will improve Description Including pain rating scale, medication(s)/side effects and non-pharmacologic comfort measures Outcome: Progressing   Problem: Health Behavior/Discharge Planning: Goal: Ability to manage health-related needs will improve Outcome: Progressing   

## 2022-09-01 NOTE — Consult Note (Signed)
History and Physical    [name] XBM:[id number] DOB: 02-22-1966 DOA: 08/29/2022  PCP: [name], MD (Confirm with patient/family/NH records and if not entered, this has to be entered at [facility] point of entry) Patient coming from: Home  I have personally briefly reviewed patient's old medical records in [place]  Chief Complaint: Feeling ok  HPI: Harold Smith is a 56 y.o. male with medical history significant of elevated blood pressure without diagnosis of HTN, OSA not on CPAP, came to hospital after fall and right leg fracture.  Work-up found patient had a right distal medial femoral condyle fracture, patient underwent ORIF.  Since admission, it was found the patient blood pressure has been elevated.  2 days ago, patient was given hydrochlorothiazide 12.5 mg +1 injection of hydralazine 10 mg and blood pressure controlled today, again blood pressure became uncontrolled despite received 25 mg of hydrochlorothiazide and 5 mg of amlodipine.  Reviewed patient history found that the patient blood pressure has been slightly elevated this year, in various office visit, most recent office visit in July's blood pressure was 152/93 and patient has not been started on any BP meds.  He denies any chest pain, no shortness of breath no lightheadedness or headache, and he reported the surgical pain has been fairly controlled with oxycodone every 4-6 hours and he has been taking Tylenol 1000 mg 3 times daily.   Review of Systems: As per HPI otherwise 14 point review of systems negative.    Past Medical History:  Diagnosis Date   Allergic rhinitis due to pollen    Anxiety    Colon polyps    Diverticulitis large intestine    HTN (hypertension)    Metabolic bone disease 0/01/7252   Obesity    Osteoarthritis, knee    Sleep apnea    does not use cpap   Sleep disturbance    Vitamin D deficiency 09/01/2022    Past Surgical History:  Procedure Laterality Date   ACHILLES TENDON  REPAIR Right 12/26/2006   CATARACT EXTRACTION W/PHACO Left 07/20/2021   Procedure: CATARACT EXTRACTION PHACO AND INTRAOCULAR LENS PLACEMENT ([place]) LEFT 2.11 00:24.0;  Surgeon: Birder Robson, MD;  Location: [place];  Service: Ophthalmology;  Laterality: Left;  sleep apnea   CATARACT EXTRACTION W/PHACO Right 08/03/2021   Procedure: CATARACT EXTRACTION PHACO AND INTRAOCULAR LENS PLACEMENT ([place]) RIGHT;  Surgeon: Birder Robson, MD;  Location: [place];  Service: Ophthalmology;  Laterality: Right;  3.19 0:33.0   CHOLECYSTECTOMY     COLON RESECTION Left 12/26/2004   due to diverticular disease at [place]  2013?   COLONOSCOPY WITH PROPOFOL N/A 05/22/2018   Procedure: COLONOSCOPY WITH PROPOFOL;  Surgeon: Lin Landsman, MD;  Location[facility] ENDOSCOPY;  Service: Gastroenterology;  Laterality: N/A;   COLONOSCOPY WITH PROPOFOL N/A 10/22/2020   Procedure: COLONOSCOPY WITH PROPOFOL;  Surgeon: Lin Landsman, MD;  Location: [place];  Service: Endoscopy;  Laterality: N/A;  priority 4   COLONOSCOPY WITH PROPOFOL N/A 11/10/2021   Procedure: COLONOSCOPY WITH PROPOFOL;  Surgeon: Lin Landsman, MD;  Location: [facility] ENDOSCOPY;  Service: Gastroenterology;  Laterality: N/A;   COLONOSCOPY WITH PROPOFOL N/A 06/16/2022   Procedure: COLONOSCOPY WITH PROPOFOL;  Surgeon: Lin Landsman, MD;  Location: [place];  Service: Endoscopy;  Laterality: N/A;   DRUG INDUCED ENDOSCOPY N/A 06/10/2019   Procedure: DRUG INDUCED SLEEP ENDOSCOPY;  Surgeon: Jerrell Belfast, MD;  Location: [place];  Service: ENT;  Laterality: N/A;   ESOPHAGOGASTRODUODENOSCOPY (EGD) WITH PROPOFOL N/A 06/16/2022   Procedure: ESOPHAGOGASTRODUODENOSCOPY (EGD) WITH PROPOFOL;  Surgeon: Lin Landsman, MD;  Location: [place];  Service: Endoscopy;  Laterality: N/A;   EYE SURGERY     GASTRIC BYPASS N/A 12/27/2007   HIATAL HERNIA REPAIR  09/25/2014    at [place] Right 08/30/2022   Procedure: OPEN REDUCTION INTERNAL FIXATION (ORIF) DISTAL FEMUR FRACTURE;  Surgeon: Altamese Ualapue, MD;  Location: [place];  Service: Orthopedics;  Laterality: Right;   POLYPECTOMY  10/22/2020   Procedure: POLYPECTOMY;  Surgeon: Lin Landsman, MD;  Location: [place];  Service: Endoscopy;;   POLYPECTOMY  06/16/2022   Procedure: POLYPECTOMY;  Surgeon: Lin Landsman, MD;  Location: [place];  Service: Endoscopy;;   ROTATOR CUFF REPAIR     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     reports that he has been smoking cigars. He has been exposed to tobacco smoke. He has never used smokeless tobacco. He reports current alcohol use. He reports that he does not use drugs.  No Known Allergies  Family History  Problem Relation Age of Onset   Hypertension Mother    Diabetes Mother    Kidney disease Mother    Heart disease Mother    Colon cancer Father 46   Asthma Sister    Obesity Sister    Stroke Sister    Obesity Brother    Diabetes Other    Heart disease Other    Hypertension Other    Kidney disease Other    Cancer Paternal Uncle        unk type, possible prostate   Lung cancer Paternal Grandmother      Prior to Admission medications   Medication Sig Start Date End Date Taking? Authorizing Provider  cetirizine (ZYRTEC) 10 MG tablet Take 10 mg by mouth daily.   Yes [provider]  Cholecalciferol 125 MCG (5000 UT) TABS Take 1 tablet by mouth daily. 09/01/22  Yes Ainsley Spinner, PA-C  Cyanocobalamin (VITAMIN B-12 PO) Take 1 tablet by mouth daily.   Yes [provider]  cyclobenzaprine (FLEXERIL) 10 MG tablet Take 10 mg by mouth 2 (two) times daily as needed for muscle spasms. 02/07/22  Yes [provider]  DULoxetine (CYMBALTA) 30 MG capsule Take 30 mg by mouth daily.   Yes [provider]  FEROSUL 325 (65 Fe) MG tablet TAKE 1 TABLET BY MOUTH DAILY WITH  BREAKFAST Patient taking differently: Take 325 mg by mouth daily with breakfast. 06/29/22  Yes [name], MD  fluticasone (FLONASE) 50 MCG/ACT nasal spray Place 2 sprays into both nostrils daily. 07/11/22  Yes [name], MD  gabapentin (NEURONTIN) 300 MG capsule Take 1 capsule (300 mg total) by mouth 2 (two) times daily. Plus 4 at bedtime Patient taking differently: Take 1,200 mg by mouth at bedtime. 03/09/22  Yes [name], MD  HYDROcodone-acetaminophen (NORCO) 10-325 MG tablet Take 1 tablet by mouth daily as needed for severe pain.   Yes [provider]  ipratropium (ATROVENT) 0.06 % nasal spray Place 2 sprays into both nostrils daily.   Yes [provider]  lipase/protease/amylase (CREON) 36000 UNITS CPEP capsule Take 2 capsules (72,000 Units total) by mouth 3 (three) times daily with meals. May also take 1 capsule (36,000 Units total) as needed (with snacks). 07/15/22  Yes Vanga, Tally Due, MD  methocarbamol (ROBAXIN) 500 MG tablet Take 500 mg  by mouth every 6 (six) hours as needed for muscle spasms. 04/14/22  Yes [provider]  Multiple Vitamin (MULTIVITAMIN) tablet Take 1 tablet by mouth daily.   Yes [provider]  omeprazole (PRILOSEC) 40 MG capsule Take 1 capsule (40 mg total) by mouth 2 (two) times daily before a meal. 06/16/22 09/14/22 Yes Vanga, Tally Due, MD  oxyCODONE-acetaminophen (PERCOCET/ROXICET) 5-325 MG tablet Take 1-2 tablets by mouth every 6 (six) hours as needed for severe pain. 09/01/22  Yes Ainsley Spinner, PA-C  QUEtiapine (SEROQUEL) 25 MG tablet Take 75 mg by mouth at bedtime.   Yes [provider]  Rivaroxaban (XARELTO) 15 MG TABS tablet Take 1 tablet (15 mg total) by mouth daily with supper. 09/01/22 10/01/22 Yes Ainsley Spinner, PA-C  sildenafil (REVATIO) 20 MG tablet Take 3-5 tablets as needed 1 hour prior to intercourse 08/16/22  Yes Hollice Espy, MD  Sodium Fluoride (PREVIDENT 5000 BOOSTER PLUS DT) Place 1  application  onto teeth 2 (two) times daily.   Yes [provider]  traZODone (DESYREL) 50 MG tablet Take 50 mg by mouth at bedtime.   Yes [provider]  zolpidem (AMBIEN CR) 12.5 MG CR tablet Take 1 tablet (12.5 mg total) by mouth at bedtime as needed for sleep. 07/08/22  Yes Viviana Simpler I, MD  amLODipine (NORVASC) 5 MG tablet Take 1 tablet (5 mg total) by mouth daily. 09/01/22   Ainsley Spinner, PA-C  docusate sodium (COLACE) 100 MG capsule Take 1 capsule (100 mg total) by mouth 2 (two) times daily. 09/01/22   Ainsley Spinner, PA-C  ergocalciferol (VITAMIN D2) 1.25 MG (50000 UT) capsule Take 1 capsule (50,000 Units total) by mouth every Monday. 09/05/22   Ainsley Spinner, PA-C  hydrochlorothiazide (HYDRODIURIL) 25 MG tablet Take 1 tablet (25 mg total) by mouth daily. 09/02/22   Ainsley Spinner, PA-C  meloxicam (MOBIC) 15 MG tablet Take 1 tablet (15 mg total) by mouth daily as needed for pain. 10/09/22   Ainsley Spinner, PA-C  Zinc Sulfate 220 (50 Zn) MG TABS Take 1 tablet (220 mg total) by mouth daily. 09/02/22   Ainsley Spinner, PA-C    Physical Exam: Vitals:   09/01/22 1048 09/01/22 1405 09/01/22 1456 09/01/22 1610  BP: (!) 156/100 (!) 174/128 (!) 161/108 (!) 174/97  Pulse: 78 86 (!) 51 82  Resp: '20 17 17   '$ Temp: 98.2 F (36.8 C) 98.2 F (36.8 C) 98.1 F (36.7 C)   TempSrc: Oral Oral Oral   SpO2: 100%  99% 100%    Constitutional: NAD, calm, comfortable Vitals:   09/01/22 1048 09/01/22 1405 09/01/22 1456 09/01/22 1610  BP: (!) 156/100 (!) 174/128 (!) 161/108 (!) 174/97  Pulse: 78 86 (!) 51 82  Resp: '20 17 17   '$ Temp: 98.2 F (36.8 C) 98.2 F (36.8 C) 98.1 F (36.7 C)   TempSrc: Oral Oral Oral   SpO2: 100%  99% 100%   Eyes: PERRL, lids and conjunctivae normal ENMT: Mucous membranes are moist. Posterior pharynx clear of any exudate or lesions.Normal dentition.  Neck: normal, supple, no masses, no thyromegaly Respiratory: clear to auscultation bilaterally, no wheezing, no crackles.  Normal respiratory effort. No accessory muscle use.  Cardiovascular: Regular rate and rhythm, no murmurs / rubs / gallops. No extremity edema. 2+ pedal pulses. No carotid bruits.  Abdomen: no tenderness, no masses palpated. No hepatosplenomegaly. Bowel sounds positive.  Musculoskeletal: no clubbing / cyanosis. No joint deformity upper and lower extremities. Good ROM, no contractures. Normal muscle tone.  Skin: no rashes, lesions, ulcers. No induration.  Surgical site clean no bleeding no effusion Neurologic: CN 2-12 grossly intact. Sensation intact, DTR normal. Strength 5/5 in all 4.  Psychiatric: Normal judgment and insight. Alert and oriented x 3. Normal mood.    Labs on Admission: I have personally reviewed following labs and imaging studies  CBC: Recent Labs  Lab 08/28/22 1711 08/30/22 0030 08/31/22 0033  WBC 10.9* 8.2 11.5*  NEUTROABS 8.6*  --   --   HGB 12.3* 11.4* 10.2*  HCT 37.6* 34.1* 30.9*  MCV 87.6 88.8 90.1  PLT 279 239 540   Basic Metabolic Panel: Recent Labs  Lab 08/28/22 1711 08/30/22 0030 08/31/22 0033  NA 135 136  --   K 3.7 3.5  --   CL 105 105  --   CO2 22 24  --   GLUCOSE 124* 94  --   BUN 20 8  --   CREATININE 0.65 0.70  --   CALCIUM 8.4* 8.3* 7.9*   GFR: Estimated Creatinine Clearance: 107.2 mL/min (by C-G formula based on SCr of 0.7 mg/dL). Liver Function Tests: Recent Labs  Lab 08/28/22 1711 08/30/22 0030  AST 23 17  ALT 34 26  ALKPHOS 88 80  BILITOT 0.9 0.8  PROT 7.2 5.8*  ALBUMIN 3.8 3.0*   No results for input(s): "LIPASE", "AMYLASE" in the last 168 hours. No results for input(s): "AMMONIA" in the last 168 hours. Coagulation Profile: No results for input(s): "INR", "PROTIME" in the last 168 hours. Cardiac Enzymes: No results for input(s): "CKTOTAL", "CKMB", "CKMBINDEX", "TROPONINI" in the last 168 hours. BNP (last 3 results) No results for input(s): "PROBNP" in the last 8760 hours. HbA1C: No results for input(s): "HGBA1C" in the  last 72 hours. CBG: No results for input(s): "GLUCAP" in the last 168 hours. Lipid Profile: No results for input(s): "CHOL", "HDL", "LDLCALC", "TRIG", "CHOLHDL", "LDLDIRECT" in the last 72 hours. Thyroid Function Tests: Recent Labs    08/31/22 0033  TSH 0.308*   Anemia Panel: No results for input(s): "VITAMINB12", "FOLATE", "FERRITIN", "TIBC", "IRON", "RETICCTPCT" in the last 72 hours. Urine analysis:    Component Value Date/Time   APPEARANCEUR Clear 07/12/2022 1519   LABSPEC 1.025 11/30/2014 0848   GLUCOSEU Negative 07/12/2022 1519   GLUCOSEU NEGATIVE 11/30/2014 0848   HGBUR TRACE 11/30/2014 0848   BILIRUBINUR Negative 07/12/2022 1519   PROTEINUR Trace (A) 07/12/2022 1519   PROTEINUR NEGATIVE 11/30/2014 0848   NITRITE Negative 07/12/2022 1519   LEUKOCYTESUR Negative 07/12/2022 1519    Radiological Exams on Admission: No results found.  EKG: Independently reviewed.  Sinus rhythm, no acute ST changes, borderline LVH.  Assessment/Plan Principal Problem:   Displaced supracondylar fracture of distal end of right femur without intracondylar extension (HCC) Active Problems:   HTN (hypertension)   Anxiety   Vitamin D deficiency   Metabolic bone disease   (please populate well all problems here in Problem List. (For example, if patient is on BP meds at home and you resume or decide to hold them, it is a problem that needs to be her. Same for CAD, COPD, HLD and so on)  HTN, uncontrolled -Probably has a baseline undiagnosed HTN, given the significant EKG changes of LVH and poorly controlled blood pressure in the hospital as well as 1 abnormal BP reading in office. -Appears that blood pressure responded to hydrochlorothiazide 2 days ago, agreed with continue hydrochlorothiazide 25 mg daily, add lisinopril 20 mg.  From medical standpoint we will, patient can be  discharged home, and to further adjustment of blood pressure meds in PCP in 5 to 7 days.  Right distal femoral fracture  status post ORIF -As per primary team    Lequita Halt MD [facility] Pager [phone]  09/01/2022, 4:49 PM

## 2022-09-01 NOTE — Progress Notes (Signed)
Physical Therapy Treatment & Discharge Patient Details Name: Virat Prather MRN: [id number] DOB: 10-20-1966 Today's Date: 09/01/2022   History of Present Illness 56 y/o male presented to ED on 08/28/22 after knee giving out and inability to walk. Sustained comminuted closed R supracondylar femur fx with intraarticular extension. S/p ORIF R distal femur fx on 9/5. PMH: gastric bypass, sleep apnea    PT Comments    Patient functioning at modI level with use of RW and good adherence to NWB status. Reinforced use of hinge brace for OOB mobility. Provided patient with printed HEP handout for R LE strengthening and ROM. Patient has met 3/3 PT goals. No further skilled PT needs required acutely. No PT follow up recommended at this time but will benefit from OPPT once cleared for [facility].   [email or web address]  Access Code: [email or web address]    Recommendations for follow up therapy are one component of a multi-disciplinary discharge planning process, led by the attending physician.  Recommendations may be updated based on patient status, additional functional criteria and insurance authorization.  Follow Up Recommendations  No PT follow up (will benefit from OPPT once cleared to [facility])     Assistance Recommended at Discharge PRN  Patient can return home with the following     Equipment Recommendations  Rolling Bellarae Lizer (2 wheels);BSC/3in1    Recommendations for Other Services       Precautions / Restrictions Precautions Precautions: Fall Required Braces or Orthoses: Other Brace Other Brace: hinged knee brace Restrictions Weight Bearing Restrictions: Yes RLE Weight Bearing: Non weight bearing     Mobility  Bed Mobility Overal bed mobility: Modified Independent                  Transfers                        Ambulation/Gait                   Stairs             Wheelchair Mobility    Modified Rankin (Stroke Patients Only)       Balance                                             Cognition Arousal/Alertness: Awake/alert Behavior During Therapy: WFL for tasks assessed/performed Overall Cognitive Status: Within Functional Limits for tasks assessed                                          Exercises Other Exercises Other Exercises: Provided patient with printed HEP handout from [place] Comments        Pertinent Vitals/Pain Pain Assessment Pain Assessment: Faces Faces Pain Scale: Hurts little more Pain Location: R knee Pain Descriptors / Indicators: Grimacing Pain Intervention(s): Monitored during session    Home Living                          Prior Function            PT Goals (current goals can now be found in the care plan section) Acute Rehab PT Goals PT Goal Formulation: All assessment and education complete, DC therapy Progress towards PT goals: Goals  met/education completed, patient discharged from PT    Frequency    Min 3X/week      PT Plan Current plan remains appropriate    Co-evaluation              AM-PAC PT "6 Clicks" Mobility   Outcome Measure  Help needed turning from your back to your side while in a flat bed without using bedrails?: None Help needed moving from lying on your back to sitting on the side of a flat bed without using bedrails?: None Help needed moving to and from a bed to a chair (including a wheelchair)?: None Help needed standing up from a chair using your arms (e.g., wheelchair or bedside chair)?: None Help needed to walk in hospital room?: None Help needed climbing 3-5 steps with a railing? : A Little 6 Click Score: 23    End of Session   Activity Tolerance: Patient tolerated treatment well Patient left: in bed;with call bell/phone within reach Nurse Communication: Mobility status PT Visit Diagnosis: Muscle weakness (generalized) (M62.81);Other abnormalities of gait and mobility (R26.89)     Time:  7628-3151 PT Time Calculation (min) (ACUTE ONLY): 12 min  Charges:  $Therapeutic Exercise: 8-22 mins                     Avedis Bevis A. Gilford Rile PT, DPT Acute Rehabilitation Services Office [phone]    Linna Hoff 09/01/2022, 4:52 PM

## 2022-09-01 NOTE — Discharge Summary (Cosign Needed Addendum)
Orthopaedic Trauma Service (OTS) Discharge Summary   Patient ID: Harold Smith MRN: [id number] DOB/AGE: [birth year] 56 y.o.  Admit date: 08/29/2022 Discharge date: 09/02/2022  Admission Diagnoses: Closed right distal femoral condyle fracture Hypertension Anxiety  Discharge Diagnoses:  Principal Problem:   Displaced supracondylar fracture of distal end of right femur without intracondylar extension ([place]) Active Problems:   Essential hypertension   Anxiety   Vitamin D deficiency   Metabolic bone disease    Past Medical History:  Diagnosis Date   Allergic rhinitis due to pollen    Anxiety    Colon polyps    Diverticulitis large intestine    HTN (hypertension)    Metabolic bone disease 01/28/4824   Obesity    Osteoarthritis, knee    Sleep apnea    does not use cpap   Sleep disturbance    Vitamin D deficiency 09/01/2022     Procedures Performed: 08/30/2022-[name] Open reduction internal fixation of right distal femur intra-articular medial condyle fracture.  Discharged Condition: good  Hospital Course:  Patient is a 56 year old male who sustained a essentially ground-level fall on 08/28/2022.  He was walking down some stairs fell sustaining injury to his right leg.  Patient had immediate onset of pain and inability to bear weight.  He was brought to [facility] where he was found to have a right distal femur fracture involving his medial femoral condyle with intra-articular extension.  The orthopedists at [facility] asserted that this was outside the scope of practice and he was transferred to [facility] for further evaluation.  He was initially admitted to Dr. [name] was on-call the day he presented to [facility] and then was transferred to the orthopedic trauma service given that this is our area of expertise.  Patient was noted to be hypertensive during his initial work-up.  Given his isolated orthopedic injuries he is  admitted to the orthopedic service.   Patient arrived to [facility] on 08/29/2022.  We originally anticipated proceeding with ORIF on 08/29/2022 however due to several severe acute traumas we did need to move his case to the following day.  Patient did continue to have elevated blood pressures.  Did think that this was in some combination due to his acute pain.  We did continue to monitor this closely.  08/30/2022 patient was taken to the operating room for the procedure noted above.  He tolerated the procedure well.  No acute issues were noted.  He tolerated the procedure well.  After surgery was transferred to the PACU for recovery from anesthesia and then transferred to the orthopedic floor for observation, pain control and therapies.  Patient was followed closely.  No issues really noted other than his persistent hypertension.  He was initially started on HCTZ 12.5 mg daily along with PRN hydralazine.  This had little effect and he was then increased to 25 mg of HCTZ daily.  Again little effect was noted.  Did add Norvasc 5 mg daily however given the persistent nature of his blood pressure along with pressure readings decided to consult the medicine service.  Norvasc was switched to lisinopril and HCTZ was maintained.  I did remain in constant communication with PCP and Dr.[name] is aware.  Patient has not complained of any headaches, chest pain, shortness of breath.  No dizziness or other concerning findings noted during his stay.  Given the low-energy mechanism of his fracture along with his history of gastric bypass felt that his bone density  is quite compromised.  This was confirmed Clinically intraoperatively given that his bone was quite soft.  We did check some vitamin D levels which is in the deficient range.  Patient is taking vitamin D2 weekly per his preadmission medication reconciliation sheet.  He does remain on vitamin supplementation for his gastric bypass (2015).  TSH is slightly low at 0.308  uIU/mL, vitamin D is 13.94 ng/mL, PTH is 36 pg/mL, total testosterone is 411 ng/dL (remainder of his testosterone panel is pending).  As per my normal work-up for fragility fractures in younger patients a serum toxicology screen is pending at the time of this discharge summary  On 09/02/2022 patient deemed stable for discharge to home.  Blood pressures were improved, pain is controlled with oral medication.  Tolerating regular diet, voiding without difficulty and passing gas.  Patient has also had a bowel movement.  Dressings were changed on 09/01/2022 and wound care was reviewed with the patient.  All DME has been delivered to the room.  Patient will follow-up with orthopedics in 10 to 14 days as well as his primary care physician in 10 to 14 days as well.  Please see recommendations below from hospitalist note  Seen by my partner Dr. [name] 9/7.  66 yo with hx OSA, newly diagnosed hypertension who presented after a fall with right distal medial femoral condyle fracture.  Now s/p orif by orthopedics.  [facility] was consulted for management of his hypertension.         Vitals:    09/01/22 2130 09/02/22 0505  BP: (!) 159/97 (!) 144/87  Pulse: 88 85  Resp:   18  Temp:   (!) 97.5 F (36.4 C)  SpO2:   100%    Assessment and plan Uncontrolled hypertension: seen yesterday by Dr. [name], started on lisinopril in addition to HCTZ.  Needs outpatient follow up for his hypertension.  Will need repeat labs within 2 weeks to follow renal function/lytes.  BP elevated today, but overall improved, follow outpatient.  Mildly low TSH can be followed outpatient with repeat labs.  He should have outpatient follow up of his abnormal chest x ray at presentation.  Patient noted ok for discharge 9/7 from medical standpoint by Dr. [name], chart reviewed today, patient not seen today by me.  Discussed with orthopedics.  No charge note.  Consults:  Internal medicine  Significant Diagnostic Studies: labs:    Latest Reference  Range & Units 08/30/22 00:30  Sodium 135 - 145 mmol/L 136  Potassium 3.5 - 5.1 mmol/L 3.5  Chloride 98 - 111 mmol/L 105  CO2 22 - 32 mmol/L 24  Glucose 70 - 99 mg/dL 94  BUN 6 - 20 mg/dL 8  Creatinine 0.61 - 1.24 mg/dL 0.70  Calcium 8.9 - 10.3 mg/dL 8.3 (L)  Anion gap 5 - 15  7  Alkaline Phosphatase 38 - 126 U/L 80  Albumin 3.5 - 5.0 g/dL 3.0 (L)  AST 15 - 41 U/L 17  ALT 0 - 44 U/L 26  Total Protein 6.5 - 8.1 g/dL 5.8 (L)  Total Bilirubin 0.3 - 1.2 mg/dL 0.8  GFR, Estimated >60 mL/min >60  (L): Data is abnormally low   Latest Reference Range & Units 08/31/22 00:33  WBC 4.0 - 10.5 K/uL 11.5 (H)  RBC 4.22 - 5.81 MIL/uL 3.43 (L)  Hemoglobin 13.0 - 17.0 g/dL 10.2 (L)  HCT 39.0 - 52.0 % 30.9 (L)  MCV 80.0 - 100.0 fL 90.1  MCH 26.0 - 34.0 pg 29.7  MCHC 30.0 - 36.0 g/dL 33.0  RDW 11.5 - 15.5 % 20.2 (H)  Platelets 150 - 400 K/uL 210  nRBC 0.0 - 0.2 % 0.0  (H): Data is abnormally high (L): Data is abnormally low   Latest Reference Range & Units 08/30/22 00:30  Vitamin D, 25-Hydroxy 30 - 100 ng/mL 13.94 (L)  (L): Data is abnormally low   Latest Reference Range & Units 08/31/22 00:33  Sex Horm Binding Glob, Serum 19.3 - 76.4 nmol/L 57.9  Testosterone 264 - 916 ng/dL 411  PTH, Intact 15 - 65 pg/mL 36  Calcium, Total (PTH) 8.7 - 10.2 mg/dL 7.9 (L)  PTH Interp  Comment  TSH 0.350 - 4.500 uIU/mL 0.308 (L)  (L): Data is abnormally low  Treatments: IV hydration, antibiotics: Ancef, analgesia: acetaminophen, Morphine, and oxycodone, anticoagulation: Lovenox while inpatient and Xarelto at discharge, therapies: PT, OT, and RN, and surgery: As above  Discharge Exam:           Orthopaedic Trauma Service Progress Note   Patient ID: Harold Smith MRN: [id number] DOB/AGE: Apr 06, 1966 56 y.o.   Subjective:   Doing better Ready to go home   Appreciate medicine service assistance with HTN   Hospitalist yesterday switched amlodipine to lisinopril    I sent rx to regular  pharmacy for BP monitor as well      No CP, no SOB No  headache No lightheadedness or dizziness     ROS As above   Objective:    VITALS:         Vitals:    09/01/22 2050 09/01/22 2130 09/02/22 0505 09/02/22 0940  BP: (!) 165/102 (!) 159/97 (!) 144/87 (!) 146/99  Pulse: 83 88 85 79  Resp: _0 Temp: 97.8 F (36.6 C)   (!) 97.5 F (36.4 C) 98.2 F (36.8 C)  TempSrc: Oral   Oral Oral  SpO2: 97%   100% 99%      Estimated body mass index is 25.21 kg/m as calculated from the following:   Height as of 08/28/22: _1  (1.803 m).   Weight as of 08/28/22: 82 kg.     Intake/Output      09/07 0701 09/08 0700 09/08 0701 09/09 0700   P.O. 600 390   Total Intake 600 390   Urine 600    Stool 0    Total Output 600    Net 0 +390        Urine Occurrence 1 x    Stool Occurrence 2 x       LABS   Lab Results Last 24 Hours  No results found for this or any previous visit (from the past 24 hour(s)).       PHYSICAL EXAM:    Gen: resting comfortably in bed, looks good, no complaints Lungs: unlabored Cardiac:reg Ext:       Right Lower Extremity              Hinged knee brace is in place and fitting well.  It is unlocked             Compression sock in place              Dressing clean, dry and intact             Ext warm              Distal motor and sensory functions intact  No DCT              Swelling controlled             + DP pulse             Compartments are soft   Assessment/Plan: 3 Days Post-Op    Principal Problem:   Displaced supracondylar fracture of distal end of right femur without intracondylar extension ([facility]) Active Problems:   Essential hypertension   Anxiety   Vitamin D deficiency   Metabolic bone disease      Anti-infectives (From admission, onward)        Start     Dose/Rate Route Frequency Ordered Stop    08/30/22 0600   ceFAZolin (ANCEF) IVPB 2g/100 mL premix        2 g 200 mL/hr over 30 Minutes Intravenous On call  to O.R. 08/29/22 1752 08/30/22 1147    08/29/22 1500   ceFAZolin (ANCEF) IVPB 2g/100 mL premix  Status:  Discontinued        2 g 200 mL/hr over 30 Minutes Intravenous On call to O.R. 08/29/22 1014 08/29/22 1811         .   POD/HD#: 41   56 y/o male ground level fall with R distal medial femoral condyle fracture    -R distal medial femoral condyle fracture, fragility fracture s/p ORIF                  NWB x 6 weeks               Unrestricted ROM                Hinged brace               PT/OT              Dressing changes as needed    - Pain management:              multimodal    - ABL anemia/Hemodynamics             Stable             BP improving    - Medical issues                Chronic conditions appear stable               HTN                         Appreciate medicine consult                         HCTZ to 25 mg daily                          Lisinopril 20 mg daily                            Will need follow up with PCP in 10-14 days along with follow up labs                                       - DVT/PE prophylaxis:               Lovenox post op  Xarelto at dc x 30 days   - ID:                Periop abx completed    - Metabolic Bone Disease:               vitamin d deficiency                          Supplement                                        Suspicious for osteoporsis                            Recommend dexa as an outpatient                Malabsorption due to gastric bypass?   - Activity:               NWB R leg o/w as tolerated    - FEN/GI prophylaxis/Foley/Lines:               reg diet                 - Impediments to fracture healing:               Poor bone quality                History of gastric bypass                Vitamin d deficiency    - Dispo:              continue with therapies               dc home today              Follow up with ortho in 10-14 days             Follow up with pcp in 10-14 days     Disposition: Discharge disposition: 01-Home or Self Care       Discharge Instructions     Call MD / Call 911   Complete by: As directed    If you experience chest pain or shortness of breath, CALL 911 and be transported to the hospital emergency room.  If you develope a fever above 101 F, pus (white drainage) or increased drainage or redness at the wound, or calf pain, call your surgeon's office.   Constipation Prevention   Complete by: As directed    Drink plenty of fluids.  Prune juice may be helpful.  You may use a stool softener, such as Colace (over the counter) 100 mg twice a day.  Use MiraLax (over the counter) for constipation as needed.   Diet general   Complete by: As directed    Discharge instructions   Complete by: As directed    Orthopaedic Trauma Service Discharge Instructions   General Discharge Instructions  Orthopaedic Injuries:  Right distal femur fracture treated with open reduction internal fixation using plate and screws  WEIGHT BEARING STATUS: Nonweightbearing right leg  RANGE OF MOTION/ACTIVITY: Unrestricted range of motion right knee.  Activity as tolerated while maintaining weightbearing restrictions.  Hinged knee brace only needs to be on when up moving around.  It may be off when  you are in bed.  Do not let knee rest in a bent position either keep it all the way straight or bent to 90 degrees  Bone health: Labs show severe vitamin D deficiency.  Please take supplements that have been prescribed for you.  Continue taking your vitamin D2 weekly and start taking vitamin D3 daily 2000-5000 IUs daily  Review the following resource for additional information regarding bone health  asphaltmakina.com  Wound Care: Daily wound care as needed starting on 09/06/2022.  Can leave incision open to air once it is dry.  You may shower and clean wound with soap and water only once it is dry  Discharge Wound Care Instructions  Do NOT apply  any ointments, solutions or lotions to pin sites or surgical wounds.  These prevent needed drainage and even though solutions like hydrogen peroxide kill bacteria, they also damage cells lining the pin sites that help fight infection.  Applying lotions or ointments can keep the wounds moist and can cause them to breakdown and open up as well. This can increase the risk for infection. When in doubt call the office.  Surgical incisions should be dressed daily.  If any drainage is noted, use one layer of adaptic or Mepitel, then gauze, Kerlix, and an ace wrap.  [email or web address] [email or web address]  CheapWipes.gl  These dressing supplies should be available at local medical supply stores ([employer], [employer] [facility], etc). They are not usually carried at places like [facility], [facility], [employer], etc  Once the incision is completely dry and without drainage, it may be left open to air out.  Showering may begin 36-48 hours later.  Cleaning gently with soap and water.    DVT/PE prophylaxis: Xarelto 15 mg daily x 30 days  Diet: as you were eating previously.  Can use over the counter stool softeners and bowel preparations, such as Miralax, to help with bowel movements.  Narcotics can be constipating.  Be sure to drink plenty of fluids  PAIN MEDICATION USE AND EXPECTATIONS  You have likely been given narcotic medications to help control your pain.  After a traumatic event that results in an fracture (broken bone) with or without surgery, it is ok to use narcotic pain medications to help control one's pain.  We understand that everyone responds to pain differently and each individual patient will be evaluated on a regular basis for the continued need for narcotic medications. Ideally,  narcotic medication use should last no more than 6-8 weeks (coinciding with fracture healing).   As a patient it is your responsibility as well to monitor narcotic medication use and report the amount and frequency you use these medications when you come to your office visit.   We would also advise that if you are using narcotic medications, you should take a dose prior to therapy to maximize you participation.  IF YOU ARE ON NARCOTIC MEDICATIONS IT IS NOT PERMISSIBLE TO OPERATE A MOTOR VEHICLE (MOTORCYCLE/CAR/TRUCK/MOPED) OR HEAVY MACHINERY DO NOT MIX NARCOTICS WITH OTHER CNS (CENTRAL NERVOUS SYSTEM) DEPRESSANTS SUCH AS ALCOHOL   POST-OPERATIVE OPIOID TAPER INSTRUCTIONS:  It is important to wean off of your opioid medication as soon as possible. If you do not need pain medication after your surgery it is ok to stop day one.  Opioids include:  o Codeine, Hydrocodone(Norco, Vicodin), Oxycodone(Percocet, oxycontin) and hydromorphone amongst others.   Long term and even short term use of opiods can cause:  o Increased pain response  o Dependence  o Constipation  o Depression  o Respiratory depression  o And more.   Withdrawal symptoms can include  o Flu like symptoms  o Nausea, vomiting  o And more  Techniques to manage these symptoms  o Hydrate well  o Eat regular healthy meals  o Stay active  o Use relaxation techniques(deep breathing, meditating, yoga)  Do Not substitute Alcohol to help with tapering  If you have been on opioids for less than two weeks and do not have pain than it is ok to stop all together.   Plan to wean off of opioids  o This plan should start within one week post op of your fracture surgery   o Maintain the same interval or time between taking each dose and first decrease the dose.   o Cut the total daily intake of opioids by one tablet each day  o Next start to increase the time between doses.  o The last dose that should be eliminated is the evening  dose.    STOP SMOKING OR USING NICOTINE PRODUCTS!!!!  As discussed nicotine severely impairs your body's ability to heal surgical and traumatic wounds but also impairs bone healing.  Wounds and bone heal by forming microscopic blood vessels (angiogenesis) and nicotine is a vasoconstrictor (essentially, shrinks blood vessels).  Therefore, if vasoconstriction occurs to these microscopic blood vessels they essentially disappear and are unable to deliver necessary nutrients to the healing tissue.  This is one modifiable factor that you can do to dramatically increase your chances of healing your injury.    (This means no smoking, no nicotine gum, patches, etc)  DO NOT USE NONSTEROIDAL ANTI-INFLAMMATORY DRUGS (NSAID'S)  Using products such as Advil (ibuprofen), Aleve (naproxen), Motrin (ibuprofen) for additional pain control during fracture healing can delay and/or prevent the healing response.  If you would like to take over the counter (OTC) medication, Tylenol (acetaminophen) is ok.  However, some narcotic medications that are given for pain control contain acetaminophen as well. Therefore, you should not exceed more than 4000 mg of tylenol in a day if you do not have liver disease.  Also note that there are may OTC medicines, such as cold medicines and allergy medicines that my contain tylenol as well.  If you have any questions about medications and/or interactions please ask your doctor/PA or your pharmacist.      ICE AND ELEVATE INJURED/OPERATIVE EXTREMITY  Using ice and elevating the injured extremity above your heart can help with swelling and pain control.  Icing in a pulsatile fashion, such as 20 minutes on and 20 minutes off, can be followed.    Do not place ice directly on skin. Make sure there is a barrier between to skin and the ice pack.    Using frozen items such as frozen peas works well as the conform nicely to the are that needs to be iced.  USE AN ACE WRAP OR TED HOSE FOR SWELLING  CONTROL  In addition to icing and elevation, Ace wraps or TED hose are used to help limit and resolve swelling.  It is recommended to use Ace wraps or TED hose until you are informed to stop.    When using Ace Wraps start the wrapping distally (farthest away from the body) and wrap proximally (closer to the body)   Example: If you had surgery on your leg or thing and you do not have a splint on, start the ace wrap at the toes and work your way up to the thigh  If you had surgery on your upper extremity and do not have a splint on, start the ace wrap at your fingers and work your way up to the upper arm  IF YOU ARE IN A SPLINT OR CAST DO NOT REMOVE IT FOR ANY REASON   If your splint gets wet for any reason please contact the office immediately. You may shower in your splint or cast as long as you keep it dry.  This can be done by wrapping in a cast cover or garbage back (or similar)  Do Not stick any thing down your splint or cast such as pencils, money, or hangers to try and scratch yourself with.  If you feel itchy take benadryl as prescribed on the bottle for itching  IF YOU ARE IN A CAM BOOT (BLACK BOOT)  You may remove boot periodically. Perform daily dressing changes as noted below.  Wash the liner of the boot regularly and wear a sock when wearing the boot. It is recommended that you sleep in the boot until told otherwise    Call office for the following: ? Temperature greater than 101F ? Persistent nausea and vomiting ? Severe uncontrolled pain ? Redness, tenderness, or signs of infection (pain, swelling, redness, odor or green/yellow discharge around the site) ? Difficulty breathing, headache or visual disturbances ? Hives ? Persistent dizziness or light-headedness ? Extreme fatigue ? Any other questions or concerns you may have after discharge  In an emergency, call 911 or go to an Emergency Department at a nearby hospital  HELPFUL INFORMATION  ? If you had a block, it  will wear off between 8-24 hrs postop typically.  This is period when your pain may go from nearly zero to the pain you would have had postop without the block.  This is an abrupt transition but nothing dangerous is happening.  You may take an extra dose of narcotic when this happens.  ? You should wean off your narcotic medicines as soon as you are able.  Most patients will be off or using minimal narcotics before their first postop appointment.   ? We suggest you use the pain medication the first night prior to going to bed, in order to ease any pain when the anesthesia wears off. You should avoid taking pain medications on an empty stomach as it will make you nauseous.  ? Do not drink alcoholic beverages or take illicit drugs when taking pain medications.  ? In most states it is against the law to drive while you are in a splint or sling.  And certainly against the law to drive while taking narcotics.  ? You may return to work/school in the next couple of days when you feel up to it.   ? Pain medication may make you constipated.  Below are a few solutions to try in this order:   ? Decrease the amount of pain medication if you aren't having pain.   ? Drink lots of decaffeinated fluids.   ? Drink prune juice and/or each dried prunes   o If the first 3 don't work start with additional solutions   ? Take Colace - an over-the-counter stool softener   ? Take Senokot - an over-the-counter laxative   ? Take Miralax - a stronger over-the-counter laxative     CALL THE OFFICE WITH ANY QUESTIONS OR CONCERNS: [phone]   VISIT OUR WEBSITE FOR ADDITIONAL INFORMATION: [email or web address]   Do not put a pillow under the knee. Place it under the  heel.   Complete by: As directed    Driving restrictions   Complete by: As directed    No driving   Increase activity slowly as tolerated   Complete by: As directed    Non weight bearing   Complete by: As directed    Laterality: right   Extremity:  Lower   Post-operative opioid taper instructions:   Complete by: As directed    POST-OPERATIVE OPIOID TAPER INSTRUCTIONS: It is important to wean off of your opioid medication as soon as possible. If you do not need pain medication after your surgery it is ok to stop day one. Opioids include: Codeine, Hydrocodone(Norco, Vicodin), Oxycodone(Percocet, oxycontin) and hydromorphone amongst others.  Long term and even short term use of opiods can cause: Increased pain response Dependence Constipation Depression Respiratory depression And more.  Withdrawal symptoms can include Flu like symptoms Nausea, vomiting And more Techniques to manage these symptoms Hydrate well Eat regular healthy meals Stay active Use relaxation techniques(deep breathing, meditating, yoga) Do Not substitute Alcohol to help with tapering If you have been on opioids for less than two weeks and do not have pain than it is ok to stop all together.  Plan to wean off of opioids This plan should start within one week post op of your joint replacement. Maintain the same interval or time between taking each dose and first decrease the dose.  Cut the total daily intake of opioids by one tablet each day Next start to increase the time between doses. The last dose that should be eliminated is the evening dose.         Allergies as of 09/02/2022   No Known Allergies      Medication List     STOP taking these medications    HYDROcodone-acetaminophen 10-325 MG tablet Commonly known as: NORCO       TAKE these medications    cetirizine 10 MG tablet Commonly known as: ZYRTEC Take 10 mg by mouth daily.   cyclobenzaprine 10 MG tablet Commonly known as: FLEXERIL Take 10 mg by mouth 2 (two) times daily as needed for muscle spasms.   docusate sodium 100 MG capsule Commonly known as: COLACE Take 1 capsule (100 mg total) by mouth 2 (two) times daily.   DULoxetine 30 MG capsule Commonly known as:  CYMBALTA Take 30 mg by mouth daily.   FeroSul 325 (65 FE) MG tablet Generic drug: ferrous sulfate TAKE 1 TABLET BY MOUTH DAILY WITH BREAKFAST What changed: how much to take   fluticasone 50 MCG/ACT nasal spray Commonly known as: FLONASE Place 2 sprays into both nostrils daily.   gabapentin 300 MG capsule Commonly known as: NEURONTIN Take 1 capsule (300 mg total) by mouth 2 (two) times daily. Plus 4 at bedtime What changed:  how much to take when to take this additional instructions   hydrochlorothiazide 25 MG tablet Commonly known as: HYDRODIURIL Take 1 tablet (25 mg total) by mouth daily.   ipratropium 0.06 % nasal spray Commonly known as: ATROVENT Place 2 sprays into both nostrils daily.   lipase/protease/amylase 36000 UNITS Cpep capsule Commonly known as: Creon Take 2 capsules (72,000 Units total) by mouth 3 (three) times daily with meals. May also take 1 capsule (36,000 Units total) as needed (with snacks).   lisinopril 20 MG tablet Commonly known as: ZESTRIL Take 1 tablet (20 mg total) by mouth daily.   meloxicam 15 MG tablet Commonly known as: MOBIC Take 1 tablet (15 mg total) by mouth daily as  needed for pain. Start taking on: October 09, 2022 What changed: These instructions start on October 09, 2022. If you are unsure what to do until then, ask your doctor or other care provider.   methocarbamol 500 MG tablet Commonly known as: ROBAXIN Take 500 mg by mouth every 6 (six) hours as needed for muscle spasms.   multivitamin tablet Take 1 tablet by mouth daily.   omeprazole 40 MG capsule Commonly known as: PRILOSEC Take 1 capsule (40 mg total) by mouth 2 (two) times daily before a meal.   oxyCODONE-acetaminophen 5-325 MG tablet Commonly known as: PERCOCET/ROXICET Take 1-2 tablets by mouth every 6 (six) hours as needed for severe pain.   PREVIDENT 5000 BOOSTER PLUS DT Place 1 application  onto teeth 2 (two) times daily.   QUEtiapine 25 MG tablet Commonly  known as: SEROQUEL Take 75 mg by mouth at bedtime.   sildenafil 20 MG tablet Commonly known as: REVATIO Take 3-5 tablets as needed 1 hour prior to intercourse   SM Vitamin D3 125 MCG (5000 UT) Tabs Generic drug: Cholecalciferol Take 1 tablet by mouth daily.   traZODone 50 MG tablet Commonly known as: DESYREL Take 50 mg by mouth at bedtime.   VITAMIN B-12 PO Take 1 tablet by mouth daily.   Vitamin D (Ergocalciferol) 1.25 MG (50000 UNIT) Caps capsule Commonly known as: DRISDOL Take 1 capsule (50,000 Units total) by mouth every Monday. Start taking on: September 05, 2022   Xarelto 15 MG Tabs tablet Generic drug: Rivaroxaban Take 1 tablet (15 mg total) by mouth daily with supper.   Zinc Sulfate 220 (50 Zn) MG Tabs Take 1 tablet (220 mg total) by mouth daily.   zolpidem 12.5 MG CR tablet Commonly known as: AMBIEN CR Take 1 tablet (12.5 mg total) by mouth at bedtime as needed for sleep.       ASK your doctor about these medications    Blood Pressure Monitor Kit 1 kit by Does not apply route once for 1 dose. Ask about: Should I take this medication?               Durable Medical Equipment  (From admission, onward)           Start     Ordered   08/31/22 1202  For home use only DME Bedside commode  Once       Comments: Lifetime need  Question:  Patient needs a bedside commode to treat with the following condition  Answer:  Weakness   08/31/22 1202   08/31/22 1202  For home use only DME Walker rolling  Once       Comments: Lifetime need  Question Answer Comment  Walker: With 5 Inch Wheels   Patient needs a walker to treat with the following condition Weakness      08/31/22 1202              Discharge Care Instructions  (From admission, onward)           Start     Ordered   09/01/22 0000  Non weight bearing       Question Answer Comment  Laterality right   Extremity Lower      09/01/22 1126            Follow-up Information      Altamese [place], MD. Schedule an appointment as soon as possible for a visit in 2 day(s).   Specialty: Orthopedic Surgery Contact information: [place]  [place] [address] [phone]         [name], MD. Schedule an appointment as soon as possible for a visit in 1 week(s).   Specialties: Internal Medicine, Pediatrics Contact information: [place] [address] [phone]                 Discharge Instructions and Plan:  56 year old male ground-level fall/fragility fracture right distal femur (right medial femoral condyle fracture with intra-articular extension)  Weightbearing: NWB RLE Insicional and dressing care: Daily dressing changes with 4 x 4 gauze and tape or Mepilex type dressing Orthopedic device(s):  Hinged knee brace, walker Showering: Okay to shower and clean wounds with soap and water only once wounds are dry VTE prophylaxis:  Xarelto 15 mg daily x 30 days   Pain control: Multimodal with Tylenol, Percocet.  Patient on Flexeril, Robaxin and gabapentin for his chronic back pain Bone Health/Optimization: Continue with vitamin D2 weekly and 5000 IUs of vitamin D3 daily.  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

## 2022-09-01 NOTE — Progress Notes (Signed)
Orthopedic Tech Progress Note Patient Details:  Harold Smith [birth year] [id number] Called in order to [employer]anger for compression socks Patient ID: Harold Smith, male   DOB: 04-20-1966, 56 y.o.   MRN: [id number]  Harold Smith 09/01/2022, 10:45 AM

## 2022-09-01 NOTE — Plan of Care (Signed)
Preferred morphine for his pain that oxycodone. With mild swelling in right knee.  Problem: Pain Managment: Goal: General experience of comfort will improve Outcome: Progressing

## 2022-09-01 NOTE — Progress Notes (Addendum)
Mobility Specialist - Progress Note   09/01/22 1404  Mobility  Activity Ambulated with assistance in hallway  Level of Assistance Standby assist, set-up cues, supervision of patient - no hands on  Assistive Device Front wheel walker  RLE Weight Bearing NWB  Distance Ambulated (ft) 300 ft  Activity Response Tolerated well  $Mobility charge 1 Mobility    Pt received in bed and agreeable to mobility. Left EOB w/ call bell in reach and all needs met.   Paulla Dolly Mobility Specialist

## 2022-09-01 NOTE — Discharge Instructions (Addendum)
[facility] Discharge Instructions   General Discharge Instructions  Orthopaedic Injuries:  Right distal femur fracture treated with open reduction internal fixation using plate and screws  WEIGHT BEARING STATUS: Nonweightbearing right leg  RANGE OF MOTION/ACTIVITY: Unrestricted range of motion right knee.  Activity as tolerated while maintaining weightbearing restrictions.  Hinged knee brace only needs to be on when up moving around.  It may be off when you are in bed.  Do not let knee rest in a bent position either keep it all the way straight or bent to 90 degrees  Bone health: Labs show severe vitamin D deficiency.  Please take supplements that have been prescribed for you.  Continue taking your vitamin D2 weekly and start taking vitamin D3 daily 2000-5000 IUs daily  Review the following resource for additional information regarding bone health  [email or web address].com  Wound Care: Daily wound care as needed starting on 09/06/2022.  Can leave incision open to air once it is dry.  You may shower and clean wound with soap and water only once it is dry  Discharge Wound Care Instructions  Do NOT apply any ointments, solutions or lotions to pin sites or surgical wounds.  These prevent needed drainage and even though solutions like hydrogen peroxide kill bacteria, they also damage cells lining the pin sites that help fight infection.  Applying lotions or ointments can keep the wounds moist and can cause them to breakdown and open up as well. This can increase the risk for infection. When in doubt call the office.  Surgical incisions should be dressed daily.  If any drainage is noted, use one layer of adaptic or Mepitel, then gauze, Kerlix, and an ace  wrap.  [email or web address]  [email or web address].gl  These dressing supplies should be available at local medical supply stores ([facility], [facility], etc). They are not usually carried at places like [facility], [facility], [facility], etc  Once the incision is completely dry and without drainage, it may be left open to air out.  Showering may begin 36-48 hours later.  Cleaning gently with soap and water.    DVT/PE prophylaxis: Xarelto 15 mg daily x 30 days  Diet: as you were eating previously.  Can use over the counter stool softeners and bowel preparations, such as Miralax, to help with bowel movements.  Narcotics can be constipating.  Be sure to drink plenty of fluids  PAIN MEDICATION USE AND EXPECTATIONS  You have likely been given narcotic medications to help control your pain.  After a traumatic event that results in an fracture (broken bone) with or without surgery, it is ok to use narcotic pain medications to help control one's pain.  We understand that everyone responds to pain differently and each individual patient will be evaluated on a regular basis for the continued need for narcotic medications. Ideally, narcotic medication use should last no more than 6-8 weeks (coinciding with fracture healing).   As a patient it is your responsibility as well to monitor narcotic medication use and report the amount and frequency you use these medications when you come to your office visit.   We would also advise that if you are using narcotic medications, you should take a dose prior to therapy to maximize you participation.  IF YOU ARE ON NARCOTIC MEDICATIONS IT IS NOT PERMISSIBLE TO OPERATE A MOTOR VEHICLE (MOTORCYCLE/CAR/TRUCK/MOPED) OR HEAVY MACHINERY DO NOT MIX  NARCOTICS WITH OTHER CNS (CENTRAL NERVOUS SYSTEM) DEPRESSANTS SUCH AS ALCOHOL   POST-OPERATIVE OPIOID TAPER INSTRUCTIONS: It  is important to wean off of your opioid medication as soon as possible. If you do not need pain medication after your surgery it is ok to stop day one. Opioids include: Codeine, Hydrocodone(Norco, Vicodin), Oxycodone(Percocet, oxycontin) and hydromorphone amongst others.  Long term and even short term use of opiods can cause: Increased pain response Dependence Constipation Depression Respiratory depression And more.  Withdrawal symptoms can include Flu like symptoms Nausea, vomiting And more Techniques to manage these symptoms Hydrate well Eat regular healthy meals Stay active Use relaxation techniques(deep breathing, meditating, yoga) Do Not substitute Alcohol to help with tapering If you have been on opioids for less than two weeks and do not have pain than it is ok to stop all together.  Plan to wean off of opioids This plan should start within one week post op of your fracture surgery  Maintain the same interval or time between taking each dose and first decrease the dose.  Cut the total daily intake of opioids by one tablet each day Next start to increase the time between doses. The last dose that should be eliminated is the evening dose.    STOP SMOKING OR USING NICOTINE PRODUCTS!!!!  As discussed nicotine severely impairs your body's ability to heal surgical and traumatic wounds but also impairs bone healing.  Wounds and bone heal by forming microscopic blood vessels (angiogenesis) and nicotine is a vasoconstrictor (essentially, shrinks blood vessels).  Therefore, if vasoconstriction occurs to these microscopic blood vessels they essentially disappear and are unable to deliver necessary nutrients to the healing tissue.  This is one modifiable factor that you can do to dramatically increase your chances of healing your injury.    (This means no smoking,  no nicotine gum, patches, etc)  DO NOT USE NONSTEROIDAL ANTI-INFLAMMATORY DRUGS (NSAID'S)  Using products such as Advil (ibuprofen), Aleve (naproxen), Motrin (ibuprofen) for additional pain control during fracture healing can delay and/or prevent the healing response.  If you would like to take over the counter (OTC) medication, Tylenol (acetaminophen) is ok.  However, some narcotic medications that are given for pain control contain acetaminophen as well. Therefore, you should not exceed more than 4000 mg of tylenol in a day if you do not have liver disease.  Also note that there are may OTC medicines, such as cold medicines and allergy medicines that my contain tylenol as well.  If you have any questions about medications and/or interactions please ask your doctor/PA or your pharmacist.      ICE AND ELEVATE INJURED/OPERATIVE EXTREMITY  Using ice and elevating the injured extremity above your heart can help with swelling and pain control.  Icing in a pulsatile fashion, such as 20 minutes on and 20 minutes off, can be followed.    Do not place ice directly on skin. Make sure there is a barrier between to skin and the ice pack.    Using frozen items such as frozen peas works well as the conform nicely to the are that needs to be iced.  USE AN ACE WRAP OR TED HOSE FOR SWELLING CONTROL  In addition to icing and elevation, Ace wraps or TED hose are used to help limit and resolve swelling.  It is recommended to use Ace wraps or TED hose until you are informed to stop.    When using Ace Wraps start the wrapping distally (farthest away from the body) and wrap proximally (closer to the body)   Example: If you had surgery on your leg or thing and  you do not have a splint on, start the ace wrap at the toes and work your way up to the thigh        If you had surgery on your upper extremity and do not have a splint on, start the ace wrap at your fingers and work your way up to the upper arm  IF YOU ARE IN A  SPLINT OR CAST DO NOT [place]   If your splint gets wet for any reason please contact the office immediately. You may shower in your splint or cast as long as you keep it dry.  This can be done by wrapping in a cast cover or garbage back (or similar)  Do Not stick any thing down your splint or cast such as pencils, money, or hangers to try and scratch yourself with.  If you feel itchy take benadryl as prescribed on the bottle for itching  IF YOU ARE IN A CAM BOOT (BLACK BOOT)  You may remove boot periodically. Perform daily dressing changes as noted below.  Wash the liner of the boot regularly and wear a sock when wearing the boot. It is recommended that you sleep in the boot until told otherwise    Call office for the following: Temperature greater than 101F Persistent nausea and vomiting Severe uncontrolled pain Redness, tenderness, or signs of infection (pain, swelling, redness, odor or green/yellow discharge around the site) Difficulty breathing, headache or visual disturbances Hives Persistent dizziness or light-headedness Extreme fatigue Any other questions or concerns you may have after discharge  In an emergency, call 911 or go to an Emergency Department at a nearby hospital  HELPFUL INFORMATION  If you had a block, it will wear off between 8-24 hrs postop typically.  This is period when your pain may go from nearly zero to the pain you would have had postop without the block.  This is an abrupt transition but nothing dangerous is happening.  You may take an extra dose of narcotic when this happens.  You should wean off your narcotic medicines as soon as you are able.  Most patients will be off or using minimal narcotics before their first postop appointment.   We suggest you use the pain medication the first night prior to going to bed, in order to ease any pain when the anesthesia wears off. You should avoid taking pain medications on an empty stomach as it will  make you nauseous.  Do not drink alcoholic beverages or take illicit drugs when taking pain medications.  In most states it is against the law to drive while you are in a splint or sling.  And certainly against the law to drive while taking narcotics.  You may return to work/school in the next couple of days when you feel up to it.   Pain medication may make you constipated.  Below are a few solutions to try in this order: Decrease the amount of pain medication if you aren't having pain. Drink lots of decaffeinated fluids. Drink prune juice and/or each dried prunes  If the first 3 don't work start with additional solutions Take Colace - an over-the-counter stool softener Take Senokot - an over-the-counter laxative Take Miralax - a stronger over-the-counter laxative     CALL THE OFFICE WITH ANY QUESTIONS OR CONCERNS: [phone]   VISIT OUR WEBSITE FOR ADDITIONAL INFORMATION: [email or web address] ========================================  Information on my medicine - XARELTO (Rivaroxaban)  This medication education was reviewed with me or my healthcare  representative as part of my discharge preparation.   Why was Xarelto prescribed for you? Xarelto was prescribed for you to reduce the risk of blood clots forming after orthopedic surgery. The medical term for these abnormal blood clots is venous thromboembolism (VTE).  What do you need to know about xarelto ? Take your Xarelto ONCE DAILY at the same time every day. You may take it either with or without food.  If you have difficulty swallowing the tablet whole, you may crush it and mix in applesauce just prior to taking your dose.  Take Xarelto exactly as prescribed by your doctor and DO NOT stop taking Xarelto without talking to the doctor who prescribed the medication.  Stopping without other VTE prevention medication to take the place of Xarelto may increase your risk of developing a clot.  After discharge, you should  have regular check-up appointments with your healthcare provider that is prescribing your Xarelto.    What do you do if you miss a dose? If you miss a dose, take it as soon as you remember on the same day then continue your regularly scheduled once daily regimen the next day. Do not take two doses of Xarelto on the same day.   Important Safety Information A possible side effect of Xarelto is bleeding. You should call your healthcare provider right away if you experience any of the following: Bleeding from an injury or your nose that does not stop. Unusual colored urine (red or dark brown) or unusual colored stools (red or black). Unusual bruising for unknown reasons. A serious fall or if you hit your head (even if there is no bleeding).  Some medicines may interact with Xarelto and might increase your risk of bleeding while on Xarelto. To help avoid this, consult your healthcare provider or pharmacist prior to using any new prescription or non-prescription medications, including herbals, vitamins, non-steroidal anti-inflammatory drugs (NSAIDs) and supplements.  This website has more information on Xarelto: [email or web address]-[email or web address].[email or web address]

## 2022-09-01 NOTE — Progress Notes (Signed)
Occupational Therapy Treatment Patient Details Name: Harold Smith MRN: [id number] DOB: 1966/08/02 Today's Date: 09/01/2022   History of present illness 56 y/o male presented to ED on 08/28/22 after knee giving out and inability to walk. Sustained comminuted closed R supracondylar femur fx with intraarticular extension. S/p ORIF R distal femur fx on 9/5. PMH: gastric bypass, sleep apnea   OT comments  Session focused on tub transfer training in prep for DC. After initial education, pt able to demonstrate tub transfer with standard shower chair, good maneuvering of RLE and adherence to NWB precautions. Pt with questions regarding community mobility - educated on Physiological scientist for groceries, obtaining wheelchair if pt finds community mobility too difficult to manage (though ambulated good distance with PT yesterday).    Recommendations for follow up therapy are one component of a multi-disciplinary discharge planning process, led by the attending physician.  Recommendations may be updated based on patient status, additional functional criteria and insurance authorization.    Follow Up Recommendations  No OT follow up    Assistance Recommended at Discharge PRN  Patient can return home with the following  A little help with bathing/dressing/bathroom;Assistance with cooking/housework;Assist for transportation;Help with stairs or ramp for entrance   Equipment Recommendations  BSC/3in1;Other (comment) (RW)    Recommendations for Other Services      Precautions / Restrictions Precautions Precautions: Fall Required Braces or Orthoses: Other Brace Other Brace: hinged knee brace Restrictions Weight Bearing Restrictions: Yes RLE Weight Bearing: Non weight bearing       Mobility Bed Mobility Overal bed mobility: Modified Independent                  Transfers Overall transfer level: Modified independent Equipment used: Rolling walker (2 wheels) Transfers: Sit to/from Stand,  Bed to chair/wheelchair/BSC Sit to Stand: Modified independent (Device/Increase time)     Step pivot transfers: Modified independent (Device/Increase time)     General transfer comment: to/from wheelchair for transport to 5N gym     Balance Overall balance assessment: Needs assistance Sitting-balance support: No upper extremity supported, Feet supported Sitting balance-Leahy Scale: Normal     Standing balance support: No upper extremity supported, During functional activity, Bilateral upper extremity supported Standing balance-Leahy Scale: Fair Standing balance comment: able to stand briefly on LLE without support. BUE for mobility needed d/t WB precautions                           ADL either performed or assessed with clinical judgement   ADL Overall ADL's : Needs assistance/impaired                                 Tub/ Shower Transfer: Tub transfer;Supervision/safety;Ambulation;Shower seat;Rolling walker (2 wheels) Tub/Shower Transfer Details (indicate cue type and reason): educated on use of shower chair to sit over tub edge with pt able to return demo well without assist and good manuevering of RLE Functional mobility during ADLs: Supervision/safety;Rolling walker (2 wheels)      Extremity/Trunk Assessment Upper Extremity Assessment Upper Extremity Assessment: Overall WFL for tasks assessed   Lower Extremity Assessment Lower Extremity Assessment: Defer to PT evaluation        Vision   Vision Assessment?: No apparent visual deficits   Perception     Praxis      Cognition Arousal/Alertness: Awake/alert Behavior During Therapy: WFL for tasks assessed/performed Overall Cognitive Status: Within Functional  Limits for tasks assessed                                          Exercises      Shoulder Instructions       General Comments      Pertinent Vitals/ Pain       Pain Assessment Pain Assessment: Faces Faces  Pain Scale: Hurts little more Pain Location: R knee Pain Descriptors / Indicators: Grimacing Pain Intervention(s): Monitored during session, RN gave pain meds during session, Patient requesting pain meds-RN notified  Home Living                                          Prior Functioning/Environment              Frequency  Min 2X/week        Progress Toward Goals  OT Goals(current goals can now be found in the care plan section)  Progress towards OT goals: Progressing toward goals  Acute Rehab OT Goals Patient Stated Goal: recover well, minimize pain OT Goal Formulation: With patient Time For Goal Achievement: 09/14/22 Potential to Achieve Goals: Good ADL Goals Pt Will Perform Tub/Shower Transfer: Tub transfer;with set-up;ambulating;rolling walker;shower seat Additional ADL Goal #1: Pt to demo ability to gather ADL/IADL items safely using least restrictive device  Plan Discharge plan remains appropriate    Co-evaluation                 AM-PAC OT "6 Clicks" Daily Activity     Outcome Measure   Help from another person eating meals?: None Help from another person taking care of personal grooming?: A Little Help from another person toileting, which includes using toliet, bedpan, or urinal?: A Little Help from another person bathing (including washing, rinsing, drying)?: A Little Help from another person to put on and taking off regular upper body clothing?: A Little Help from another person to put on and taking off regular lower body clothing?: A Little 6 Click Score: 19    End of Session Equipment Utilized During Treatment: Rolling walker (2 wheels)  OT Visit Diagnosis: Other abnormalities of gait and mobility (R26.89)   Activity Tolerance Patient tolerated treatment well   Patient Left in bed;with call bell/phone within reach   Nurse Communication Mobility status;Patient requests pain meds        Time: 5537-4827 OT Time  Calculation (min): 24 min  Charges: OT General Charges $OT Visit: 1 Visit OT Treatments $Self Care/Home Management : 23-37 mins  Malachy Chamber, OTR/L Acute Rehab Services Office: [phone]   Layla Maw 09/01/2022, 8:39 AM

## 2022-09-01 NOTE — Progress Notes (Signed)
Orthopaedic Trauma Service Progress Note  Patient ID: Harold Smith MRN: [id number] DOB/AGE: [birth year] 56 y.o.  Subjective:  Doing great Ready to go home No complaints or concerns   Denies any lightheadedness or vision changes No chest pain or shortness of breath No headaches No epistaxis   ROS As above  Objective:   VITALS:   Vitals:   09/01/22 0534 09/01/22 0754 09/01/22 1047 09/01/22 1048  BP: (!) 153/97 (!) 177/142 (!) 156/107 (!) 156/100  Pulse: 99 88 85 78  Resp: '18 17  20  '$ Temp: 98.1 F (36.7 C) 98 F (36.7 C) 98.2 F (36.8 C) 98.2 F (36.8 C)  TempSrc: Oral Oral Oral Oral  SpO2: 98% 100% 100% 100%    Estimated body mass index is 25.21 kg/m as calculated from the following:   Height as of 08/28/22: '5\' 11"'$  (1.803 m).   Weight as of 08/28/22: 82 kg.   Intake/Output      09/06 0701 09/07 0700 09/07 0701 09/08 0700   P.O. 240 480   I.V.     IV Piggyback     Total Intake 240 480   Urine  600   Stool  0   Blood     Total Output  600   Net +240 -120        Stool Occurrence  1 x     LABS  No results found for this or any previous visit (from the past 24 hour(s)).   PHYSICAL EXAM:   Gen: mobilizing well, looks good, no complaints Lungs: unlabored Cardiac:reg Ext:       Right Lower Extremity              Hinged knee brace is in place and fitting well.  It is unlocked  Dressings removed  Medial incision stable.  No signs of infection no drainage.  Well approximated             Ext warm              Distal motor and sensory functions intact             No DCT              Swelling controlled             + DP pulse             Compartments are soft  Assessment/Plan: 2 Days Post-Op   Principal Problem:   Displaced supracondylar fracture of distal end of right femur without intracondylar extension (HCC) Active Problems:   HTN (hypertension)   Anxiety   Vitamin  D deficiency   Anti-infectives (From admission, onward)    Start     Dose/Rate Route Frequency Ordered Stop   08/30/22 0600  ceFAZolin (ANCEF) IVPB 2g/100 mL premix        2 g 200 mL/hr over 30 Minutes Intravenous On call to O.R. 08/29/22 1752 08/30/22 1147   08/29/22 1500  ceFAZolin (ANCEF) IVPB 2g/100 mL premix  Status:  Discontinued        2 g 200 mL/hr over 30 Minutes Intravenous On call to O.R. 08/29/22 1014 08/29/22 1811     .  POD/HD#: 2  56 y/o male ground level fall with R distal medial femoral condyle fracture    -  R distal medial femoral condyle fracture, fragility fracture s/p ORIF                  NWB x 6 weeks               Unrestricted ROM                Hinged brace               PT/OT   Dressing change today.  No changes needed.  Okay to shower and clean with soap and water                              Ice and elevate   - Pain management:              multimodal    - ABL anemia/Hemodynamics               stable               Minimal blood loss in OR    - Medical issues                Chronic conditions appear stable               HTN                         Increased HCTZ to 25 mg daily                          I have discussed with PCP    Going to add Norvasc 5 mg daily    Will monitor through the morning and early afternoon and if his pressures are improved we will then discharge this evening   - DVT/PE prophylaxis:               Lovenox post op   Xarelto at dc x 30 days   - ID:                Periop abx completed    - Metabolic Bone Disease:               vitamin d deficiency                          Supplement                                        Suspicious for osteoporsis                            Recommend dexa as an outpatient                Malabsorption due to gastric bypass?   - Activity:               NWB R leg o/w as tolerated    - FEN/GI prophylaxis/Foley/Lines:               reg diet                 - Impediments  to fracture healing:  Poor bone quality                History of gastric bypass                Vitamin d deficiency    - Dispo:              continue with therapies              Likely home later this afternoon     Jari Pigg, PA-C [phone] (C) 09/01/2022, 10:52 AM  [facility] [place] [address] [phone] Jenetta [phone] (F)    After 5pm and on the weekends please log on to Amion, go to orthopaedics and the look under the [facility] Call for the provider(s) on call. You can also call our office at [phone] and then follow the prompts to be connected to the call team.   Patient ID: Harold Smith, male   DOB: [birth year], 56 y.o.   MRN: [id number]

## 2022-09-02 ENCOUNTER — Encounter: Payer: Self-pay | Admitting: Internal Medicine

## 2022-09-02 ENCOUNTER — Other Ambulatory Visit (HOSPITAL_COMMUNITY): Payer: Self-pay

## 2022-09-02 DIAGNOSIS — S72451A Displaced supracondylar fracture without intracondylar extension of lower end of right femur, initial encounter for closed fracture: Secondary | ICD-10-CM | POA: Diagnosis not present

## 2022-09-02 MED ORDER — ZOLPIDEM TARTRATE ER 12.5 MG PO TBCR: 12.5000 mg | EXTENDED_RELEASE_TABLET | Freq: Every evening | ORAL | 0 refills | Status: DC | PRN

## 2022-09-02 NOTE — TOC CM/SW Note (Signed)
Called Lacresia with [facility] for walker and 3 in 1 . DME will be delivered to patient's room this morning.

## 2022-09-02 NOTE — Progress Notes (Signed)
Pt discharged delayed due to delay in receiving equipment for home.

## 2022-09-02 NOTE — Progress Notes (Signed)
[name] to be D/C'd  per MD order.  Discussed with the patient and all questions fully answered by 6N charge nurse- Apolonio Schneiders.  VSS, Skin clean, dry and intact without evidence of skin break down, no evidence of skin tears noted.  IV catheter discontinued intact. Site without signs and symptoms of complications. Dressing and pressure applied.  An After Visit Summary was printed and given to the patient. Patient received prescription from [facility] and DME was delivered this morning.  D/c education completed with patient/family including follow up instructions, medication list, d/c activities limitations if indicated, with other d/c instructions as indicated by MD - patient able to verbalize understanding, all questions fully answered.   Patient instructed to return to ED, call 911, or call MD for any changes in condition.   Patient to be escorted via [facility], and D/C home via private auto.

## 2022-09-02 NOTE — Progress Notes (Signed)
Mobility Specialist - Progress Note   09/02/22 0900  Mobility  Activity Ambulated with assistance in hallway  Level of Assistance Standby assist, set-up cues, supervision of patient - no hands on  Assistive Device Front wheel walker  RLE Weight Bearing NWB  Distance Ambulated (ft) 300 ft  Activity Response Tolerated well  $Mobility charge 1 Mobility    Pt received in bed agreeable to mobility. C/o muscle soreness throughout back of RLE. Left EOB w/ call bell in reach and all needs met.   Paulla Dolly Mobility Specialist

## 2022-09-02 NOTE — Progress Notes (Addendum)
Seen by my partner Dr. [name] 9/7.  56 yo with hx OSA, newly diagnosed hypertension who presented after Harold Smith fall with right distal medial femoral condyle fracture.  Now s/p orif by orthopedics.  [facility] was consulted for management of his hypertension.    Vitals:   09/01/22 2130 09/02/22 0505  BP: (!) 159/97 (!) 144/87  Pulse: 88 85  Resp:  18  Temp:  (!) 97.5 F (36.4 C)  SpO2:  100%   Assessment and plan Uncontrolled hypertension: seen yesterday by Dr. [name], started on lisinopril in addition to HCTZ.  Needs outpatient follow up for his hypertension.  Will need repeat labs within 2 weeks to follow renal function/lytes.  BP elevated today, but overall improved, follow outpatient.  Mildly low TSH can be followed outpatient with repeat labs.  He should have outpatient follow up of his abnormal chest x ray at presentation.  Patient noted ok for discharge 9/7 from medical standpoint by Dr. [name], chart reviewed today, patient not seen today by me.  Discussed with orthopedics.  No charge note.

## 2022-09-02 NOTE — Progress Notes (Addendum)
Orthopaedic Trauma Service Progress Note  Patient ID: Kaven Cumbie MRN: [id number] DOB/AGE: [birth year] 58 y.o.  Subjective:  Doing better Ready to go home  Appreciate medicine service assistance with HTN  Hospitalist yesterday switched amlodipine to lisinopril   I sent rx to regular pharmacy for BP monitor as well    No CP, no SOB No  headache No lightheadedness or dizziness   ROS As above  Objective:   VITALS:   Vitals:   09/01/22 2050 09/01/22 2130 09/02/22 0505 09/02/22 0940  BP: (!) 165/102 (!) 159/97 (!) 144/87 (!) 146/99  Pulse: 83 88 85 79  Resp: '18  18 18  '$ Temp: 97.8 F (36.6 C)  (!) 97.5 F (36.4 C) 98.2 F (36.8 C)  TempSrc: Oral  Oral Oral  SpO2: 97%  100% 99%    Estimated body mass index is 25.21 kg/m as calculated from the following:   Height as of 08/28/22: '5\' 11"'$  (1.803 m).   Weight as of 08/28/22: 82 kg.   Intake/Output      09/07 0701 09/08 0700 09/08 0701 09/09 0700   P.O. 600 390   Total Intake 600 390   Urine 600    Stool 0    Total Output 600    Net 0 +390        Urine Occurrence 1 x    Stool Occurrence 2 x      LABS  No results found for this or any previous visit (from the past 24 hour(s)).   PHYSICAL EXAM:   Gen: resting comfortably in bed, looks good, no complaints Lungs: unlabored Cardiac:reg Ext:       Right Lower Extremity              Hinged knee brace is in place and fitting well.  It is unlocked  Compression sock in place              Dressing clean, dry and intact             Ext warm              Distal motor and sensory functions intact             No DCT              Swelling controlled             + DP pulse             Compartments are soft  Assessment/Plan: 3 Days Post-Op   Principal Problem:   Displaced supracondylar fracture of distal end of right femur without intracondylar extension (HCC) Active Problems:    Essential hypertension   Anxiety   Vitamin D deficiency   Metabolic bone disease    Anti-infectives (From admission, onward)    Start     Dose/Rate Route Frequency Ordered Stop   08/30/22 0600  ceFAZolin (ANCEF) IVPB 2g/100 mL premix        2 g 200 mL/hr over 30 Minutes Intravenous On call to O.R. 08/29/22 1752 08/30/22 1147   08/29/22 1500  ceFAZolin (ANCEF) IVPB 2g/100 mL premix  Status:  Discontinued        2 g 200 mL/hr over 30 Minutes Intravenous On call to O.R. 08/29/22 1014 08/29/22 1811     .  POD/HD#: 29  56 y/o male ground level fall with R distal medial femoral condyle fracture    -R distal medial femoral condyle fracture, fragility fracture s/p ORIF                  NWB x 6 weeks               Unrestricted ROM                Hinged brace               PT/OT              Dressing changes as needed    - Pain management:              multimodal    - ABL anemia/Hemodynamics  Stable  BP improving    - Medical issues                Chronic conditions appear stable               HTN   Appreciate medicine consult                         HCTZ to 25 mg daily    Lisinopril 20 mg daily     Will need follow up with PCP in 10-14 days along with follow up labs                            - DVT/PE prophylaxis:               Lovenox post op   Xarelto at dc x 30 days   - ID:                Periop abx completed    - Metabolic Bone Disease:               vitamin d deficiency                          Supplement                                        Suspicious for osteoporsis                            Recommend dexa as an outpatient                Malabsorption due to gastric bypass?   - Activity:               NWB R leg o/w as tolerated    - FEN/GI prophylaxis/Foley/Lines:               reg diet                 - Impediments to fracture healing:               Poor bone quality                History of gastric bypass                Vitamin d  deficiency    - Dispo:  continue with therapies               dc home today   Follow up with ortho in 10-14 days  Follow up with pcp in 10-14 days     Jari Pigg, PA-C [phone] (C) 09/02/2022, 9:44 AM  [facility] [place] [address] [phone] Jenetta [phone] (F)    After 5pm and on the weekends please log on to Amion, go to orthopaedics and the look under the [facility] Call for the provider(s) on call. You can also call our office at [phone] and then follow the prompts to be connected to the call team.   Patient ID: Devonne Doughty, male   DOB: [birth year], 56 y.o.   MRN: [id number]

## 2022-09-05 ENCOUNTER — Other Ambulatory Visit (HOSPITAL_COMMUNITY): Payer: Self-pay

## 2022-09-05 LAB — TESTOSTERONE, FREE: Testosterone, Free: 1.4 pg/mL — ABNORMAL LOW (ref 7.2–24.0)

## 2022-09-06 ENCOUNTER — Telehealth: Payer: Self-pay

## 2022-09-06 LAB — TESTOSTERONE, % FREE: Testosterone-% Free: 1 % — ABNORMAL HIGH (ref 0.2–0.7)

## 2022-09-06 NOTE — Telephone Encounter (Signed)
Prior auth started for Zolpidem Tartrate ER 12.'5MG'$  er tablets. [facility] [place] Key: [id number] - PA Case ID: [id number] - Rx #: [id number] Waiting for determination.

## 2022-09-07 ENCOUNTER — Other Ambulatory Visit: Payer: Self-pay | Admitting: Internal Medicine

## 2022-09-07 LAB — DRUG SCREEN 10 W/CONF, SERUM
Amphetamines, IA: NEGATIVE ng/mL
Barbiturates, IA: NEGATIVE ug/mL
Benzodiazepines, IA: NEGATIVE ng/mL
Cocaine & Metabolite, IA: NEGATIVE ng/mL
Methadone, IA: NEGATIVE ng/mL
Opiates, IA: NEGATIVE ng/mL
Oxycodones, IA: POSITIVE ng/mL — AB
Phencyclidine, IA: NEGATIVE ng/mL
Propoxyphene, IA: NEGATIVE ng/mL
THC(Marijuana) Metabolite, IA: NEGATIVE ng/mL

## 2022-09-07 LAB — OXYCODONES,MS,WB/SP RFX
Oxycocone: 5.6 ng/mL
Oxycodones Confirmation: POSITIVE
Oxymorphone: NEGATIVE ng/mL

## 2022-09-07 NOTE — Telephone Encounter (Signed)
Called and spoke to pt to see how he was doing and to verify his gabapentin dosage. He is taking as the chart said so we sent it in for that.

## 2022-09-08 MED FILL — Fentanyl Citrate Preservative Free (PF) Inj 100 MCG/2ML: INTRAMUSCULAR | Qty: 1 | Status: AC

## 2022-09-12 NOTE — Telephone Encounter (Signed)
I called to check on status of PA for Zolpidem Tartrate ER 12.'5MG'$  er tablets. [facility] [place] Key: [id number] - PA Case ID: [id number] - Rx #: [id number] I spoke to [facility] at [place].  She stated that she just sees that it says "requires PA."  They do not see the PA that I started at all, so it was neither approved nor denied.  She has created a new key for a new PA for this medication.  They new key is:  [id number]  New PA has been started for Zolpidem Tartrate ER 12.'5MG'$  er tablets. [facility] [place] Key: [id number] - PA Case ID: [id number] Waiting for determination.

## 2022-09-12 NOTE — Telephone Encounter (Signed)
Prior auth for Zolpidem Tartrate ER 12.'5MG'$  er tablets has been approved. [facility] [place] Key: [id number] - PA Case ID: [id number] We're pleased to let you know that we've approved your or your doctor's request for coverage for Zolpidem ER 12.'5mg'$  Tablets. You can now fill your prescription, and it will be covered according to your plan. As long as you remain covered by your prescription drug plan and there are no changes to your plan benefits, this request is approved from 09/12/2022 to 09/12/2025.  Approval letter sent to scanning. Patient notified via mychart.

## 2022-09-14 ENCOUNTER — Encounter: Payer: Self-pay | Admitting: Oncology

## 2022-09-14 ENCOUNTER — Other Ambulatory Visit (INDEPENDENT_AMBULATORY_CARE_PROVIDER_SITE_OTHER): Payer: Self-pay | Admitting: Orthopedic Surgery

## 2022-09-14 ENCOUNTER — Encounter: Payer: Self-pay | Admitting: Internal Medicine

## 2022-09-14 ENCOUNTER — Ambulatory Visit (INDEPENDENT_AMBULATORY_CARE_PROVIDER_SITE_OTHER): Payer: 59 | Admitting: Internal Medicine

## 2022-09-14 VITALS — BP 138/88 | HR 102 | Temp 97.6°F | Ht 71.0 in | Wt 173.0 lb

## 2022-09-14 DIAGNOSIS — I10 Essential (primary) hypertension: Secondary | ICD-10-CM

## 2022-09-14 DIAGNOSIS — S72451D Displaced supracondylar fracture without intracondylar extension of lower end of right femur, subsequent encounter for closed fracture with routine healing: Secondary | ICD-10-CM | POA: Diagnosis not present

## 2022-09-14 DIAGNOSIS — Z23 Encounter for immunization: Secondary | ICD-10-CM | POA: Diagnosis not present

## 2022-09-14 DIAGNOSIS — R600 Localized edema: Secondary | ICD-10-CM

## 2022-09-14 LAB — CBC
HCT: 34.9 % — ABNORMAL LOW (ref 39.0–52.0)
Hemoglobin: 11.5 g/dL — ABNORMAL LOW (ref 13.0–17.0)
MCHC: 32.9 g/dL (ref 30.0–36.0)
MCV: 93.9 fl (ref 78.0–100.0)
Platelets: 468 10*3/uL — ABNORMAL HIGH (ref 150.0–400.0)
RBC: 3.72 Mil/uL — ABNORMAL LOW (ref 4.22–5.81)
RDW: 20.3 % — ABNORMAL HIGH (ref 11.5–15.5)
WBC: 10.5 10*3/uL (ref 4.0–10.5)

## 2022-09-14 LAB — RENAL FUNCTION PANEL
Albumin: 3.3 g/dL — ABNORMAL LOW (ref 3.5–5.2)
BUN: 18 mg/dL (ref 6–23)
CO2: 29 mEq/L (ref 19–32)
Calcium: 8.6 mg/dL (ref 8.4–10.5)
Chloride: 105 mEq/L (ref 96–112)
Creatinine, Ser: 0.84 mg/dL (ref 0.40–1.50)
GFR: 96.22 mL/min (ref >60.00)
Glucose, Bld: 81 mg/dL (ref 70–99)
Phosphorus: 4.4 mg/dL (ref 2.3–4.6)
Potassium: 3.8 mEq/L (ref 3.5–5.1)
Sodium: 139 mEq/L (ref 135–145)

## 2022-09-14 NOTE — Addendum Note (Signed)
Addended by: Pilar Grammes on: 09/14/2022 08:20 AM   Modules accepted: Orders

## 2022-09-14 NOTE — Assessment & Plan Note (Signed)
BP Readings from Last 3 Encounters:  09/14/22 138/88  09/02/22 (!) 146/99  08/29/22 (!) 169/100   Elevated due to pain/stress, etc For now, will continue the lisinopril 20/HCTZ 25

## 2022-09-14 NOTE — Assessment & Plan Note (Signed)
Doing okay post op Has follow up with Dr Marcelino Scot today Now on [place]

## 2022-09-14 NOTE — Progress Notes (Signed)
Subjective:    Patient ID: Harold Smith, male    DOB: 09-30-1966, 56 y.o.   MRN: [id number]  HPI Here for hospital follow up and review of his blood pressure  [address] going out of house to garage Right knee has been numb for a while and ongoing pain from back He felt "a pop" and went down  [place] onto knees---couldn't get up Crawled back into house to call for help  Distal femur fracture--needed repair Now non weight bearing for some time Severe pain with this Has oxycodone--"but I don't feel that"  Not able to sleep---tosses and turns Pain if he lies on right side for any time Taking all the gabapentin at bedtime  Now on HCTZ and lisinopril for BP No headache No chest pain or SOB  Current Outpatient Medications on File Prior to Visit  Medication Sig Dispense Refill   cetirizine (ZYRTEC) 10 MG tablet Take 10 mg by mouth daily.     Cholecalciferol 125 MCG (5000 UT) TABS Take 1 tablet by mouth daily. 30 tablet 6   Cyanocobalamin (VITAMIN B-12 PO) Take 1 tablet by mouth daily.     cyclobenzaprine (FLEXERIL) 10 MG tablet Take 10 mg by mouth 2 (two) times daily as needed for muscle spasms.     docusate sodium (COLACE) 100 MG capsule Take 1 capsule (100 mg total) by mouth 2 (two) times daily. 10 capsule 0   DULoxetine (CYMBALTA) 30 MG capsule Take 30 mg by mouth daily.     ergocalciferol (VITAMIN D2) 1.25 MG (50000 UT) capsule Take 1 capsule (50,000 Units total) by mouth every Monday. 4 capsule 2   FEROSUL 325 (65 Fe) MG tablet TAKE 1 TABLET BY MOUTH DAILY WITH BREAKFAST (Patient taking differently: Take 325 mg by mouth daily with breakfast.) 30 tablet 11   fluticasone (FLONASE) 50 MCG/ACT nasal spray Place 2 sprays into both nostrils daily. 48 g 3   gabapentin (NEURONTIN) 300 MG capsule Take 1 capsule twice daily and 4 caps at bedtime 180 capsule 5   hydrochlorothiazide (HYDRODIURIL) 25 MG tablet Take 1 tablet (25 mg total) by mouth daily. 30 tablet 0   ipratropium (ATROVENT)  0.06 % nasal spray Place 2 sprays into both nostrils daily.     lipase/protease/amylase (CREON) 36000 UNITS CPEP capsule Take 2 capsules (72,000 Units total) by mouth 3 (three) times daily with meals. May also take 1 capsule (36,000 Units total) as needed (with snacks). 240 capsule 5   lisinopril (ZESTRIL) 20 MG tablet Take 1 tablet (20 mg total) by mouth daily. 30 tablet 0   [START ON 10/09/2022] meloxicam (MOBIC) 15 MG tablet Take 1 tablet (15 mg total) by mouth daily as needed for pain. 90 tablet 3   methocarbamol (ROBAXIN) 500 MG tablet Take 500 mg by mouth every 6 (six) hours as needed for muscle spasms.     Multiple Vitamin (MULTIVITAMIN) tablet Take 1 tablet by mouth daily.     omeprazole (PRILOSEC) 40 MG capsule Take 1 capsule (40 mg total) by mouth 2 (two) times daily before a meal. 60 capsule 2   oxyCODONE-acetaminophen (PERCOCET/ROXICET) 5-325 MG tablet Take 1-2 tablets by mouth every 6 (six) hours as needed for severe pain. 50 tablet 0   QUEtiapine (SEROQUEL) 25 MG tablet Take 75 mg by mouth at bedtime.     Rivaroxaban (XARELTO) 15 MG TABS tablet Take 1 tablet (15 mg total) by mouth daily with supper. 30 tablet 0   sildenafil (REVATIO) 20 MG tablet Take  3-5 tablets as needed 1 hour prior to intercourse 60 tablet 6   Sodium Fluoride (PREVIDENT 5000 BOOSTER PLUS DT) Place 1 application  onto teeth 2 (two) times daily.     traZODone (DESYREL) 50 MG tablet Take 50 mg by mouth at bedtime.     Zinc Sulfate 220 (50 Zn) MG TABS Take 1 tablet (220 mg total) by mouth daily. 30 tablet 1   zolpidem (AMBIEN CR) 12.5 MG CR tablet Take 1 tablet (12.5 mg total) by mouth at bedtime as needed for sleep. 30 tablet 0   No current facility-administered medications on file prior to visit.    No Known Allergies  Past Medical History:  Diagnosis Date   Allergic rhinitis due to pollen    Anxiety    Colon polyps    Diverticulitis large intestine    HTN (hypertension)    Metabolic bone disease 12/31/1094    Obesity    Osteoarthritis, knee    Sleep apnea    does not use cpap   Sleep disturbance    Vitamin D deficiency 09/01/2022    Past Surgical History:  Procedure Laterality Date   ACHILLES TENDON REPAIR Right 12/26/2006   CATARACT EXTRACTION W/PHACO Left 07/20/2021   Procedure: CATARACT EXTRACTION PHACO AND INTRAOCULAR LENS PLACEMENT ([place]) LEFT 2.11 00:24.0;  Surgeon: Birder Robson, MD;  Location: [place];  Service: Ophthalmology;  Laterality: Left;  sleep apnea   CATARACT EXTRACTION W/PHACO Right 08/03/2021   Procedure: CATARACT EXTRACTION PHACO AND INTRAOCULAR LENS PLACEMENT ([facility]) RIGHT;  Surgeon: Birder Robson, MD;  Location: [place];  Service: Ophthalmology;  Laterality: Right;  3.19 0:33.0   CHOLECYSTECTOMY     COLON RESECTION Left 12/26/2004   due to diverticular disease at [place]  2013?   COLONOSCOPY WITH PROPOFOL N/A 05/22/2018   Procedure: COLONOSCOPY WITH PROPOFOL;  Surgeon: Lin Landsman, MD;  Location[facility] ENDOSCOPY;  Service: Gastroenterology;  Laterality: N/A;   COLONOSCOPY WITH PROPOFOL N/A 10/22/2020   Procedure: COLONOSCOPY WITH PROPOFOL;  Surgeon: Lin Landsman, MD;  Location: [place];  Service: Endoscopy;  Laterality: N/A;  priority 4   COLONOSCOPY WITH PROPOFOL N/A 11/10/2021   Procedure: COLONOSCOPY WITH PROPOFOL;  Surgeon: Lin Landsman, MD;  Location: [facility] ENDOSCOPY;  Service: Gastroenterology;  Laterality: N/A;   COLONOSCOPY WITH PROPOFOL N/A 06/16/2022   Procedure: COLONOSCOPY WITH PROPOFOL;  Surgeon: Lin Landsman, MD;  Location: [place];  Service: Endoscopy;  Laterality: N/A;   DRUG INDUCED ENDOSCOPY N/A 06/10/2019   Procedure: DRUG INDUCED SLEEP ENDOSCOPY;  Surgeon: Jerrell Belfast, MD;  Location: [place];  Service: ENT;  Laterality: N/A;   ESOPHAGOGASTRODUODENOSCOPY (EGD) WITH PROPOFOL N/A 06/16/2022   Procedure: ESOPHAGOGASTRODUODENOSCOPY (EGD) WITH  PROPOFOL;  Surgeon: Lin Landsman, MD;  Location: [place];  Service: Endoscopy;  Laterality: N/A;   EYE SURGERY     GASTRIC BYPASS N/A 12/27/2007   HIATAL HERNIA REPAIR  09/25/2014   at [place] Right 08/30/2022   Procedure: OPEN REDUCTION INTERNAL FIXATION (ORIF) DISTAL FEMUR FRACTURE;  Surgeon: Altamese Midvale, MD;  Location: [place];  Service: Orthopedics;  Laterality: Right;   POLYPECTOMY  10/22/2020   Procedure: POLYPECTOMY;  Surgeon: Lin Landsman, MD;  Location: [place];  Service: Endoscopy;;   POLYPECTOMY  06/16/2022   Procedure: POLYPECTOMY;  Surgeon: Lin Landsman, MD;  Location: [place];  Service: Endoscopy;;   ROTATOR CUFF REPAIR  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    Family History  Problem Relation Age of Onset   Hypertension Mother    Diabetes Mother    Kidney disease Mother    Heart disease Mother    Colon cancer Father 93   Asthma Sister    Obesity Sister    Stroke Sister    Obesity Brother    Diabetes Other    Heart disease Other    Hypertension Other    Kidney disease Other    Cancer Paternal Uncle        unk type, possible prostate   Lung cancer Paternal Grandmother     Social History   Socioeconomic History   Marital status: Single    Spouse name: Not on file   Number of children: 3   Years of education: Not on file   Highest education level: Not on file  Occupational History   Occupation: Production designer, theatre/television/film  Tobacco Use   Smoking status: Some Days    Types: Cigars    Passive exposure: Past   Smokeless tobacco: Never   Tobacco comments:    occasional cigar  Vaping Use   Vaping Use: Never used  Substance and Sexual Activity   Alcohol use: Yes    Alcohol/week: 0.0 standard drinks of alcohol    Comment: occasional   Drug use: No   Sexual activity: Yes  Other Topics Concern   Not on file  Social History Narrative   Lives with 2 daughters from 62st  marriage   Shared custody of another daughter from 2nd marriage   Social Determinants of Health   Financial Resource Strain: Not on file  Food Insecurity: Not on file  Transportation Needs: Not on file  Physical Activity: Not on file  Stress: Not on file  Social Connections: Not on file  Intimate Partner Violence: Not on file   Review of Systems Anemia is better Eating okay     Objective:   Physical Exam Constitutional:      Appearance: Normal appearance.  Cardiovascular:     Rate and Rhythm: Normal rate and regular rhythm.     Heart sounds: No murmur heard.    No gallop.  Pulmonary:     Effort: Pulmonary effort is normal.     Breath sounds: Normal breath sounds. No wheezing or rales.  Musculoskeletal:     Cervical back: Neck supple.     Right lower leg: No edema.     Left lower leg: No edema.  Lymphadenopathy:     Cervical: No cervical adenopathy.  Neurological:     Mental Status: He is alert.  Psychiatric:     Comments: Frustrated and down due to ongoing medical issues--but no overtly depressed            Assessment & Plan:

## 2022-09-15 ENCOUNTER — Ambulatory Visit (INDEPENDENT_AMBULATORY_CARE_PROVIDER_SITE_OTHER): Payer: Self-pay

## 2022-09-15 DIAGNOSIS — R600 Localized edema: Secondary | ICD-10-CM

## 2022-09-17 ENCOUNTER — Other Ambulatory Visit: Payer: Self-pay | Admitting: Internal Medicine

## 2022-09-19 ENCOUNTER — Other Ambulatory Visit: Payer: Self-pay

## 2022-09-19 ENCOUNTER — Other Ambulatory Visit: Payer: Self-pay | Admitting: Internal Medicine

## 2022-09-19 MED ORDER — METHOCARBAMOL 500 MG PO TABS
500.0000 mg | ORAL_TABLET | Freq: Four times a day (QID) | ORAL | 1 refills | Status: DC | PRN
  Filled 2022-09-19 – 2023-02-20 (×2): qty 60, 15d supply, fill #0

## 2022-09-19 MED ORDER — QUETIAPINE FUMARATE 25 MG PO TABS
75.0000 mg | ORAL_TABLET | Freq: Every day | ORAL | 5 refills | Status: DC
  Filled 2022-09-19 – 2022-09-27 (×2): qty 90, 30d supply, fill #0

## 2022-09-19 MED ORDER — CYCLOBENZAPRINE HCL 10 MG PO TABS
10.0000 mg | ORAL_TABLET | Freq: Every evening | ORAL | 0 refills | Status: DC | PRN
  Filled 2022-09-19 – 2023-02-20 (×2): qty 30, 30d supply, fill #0

## 2022-09-20 ENCOUNTER — Other Ambulatory Visit: Payer: Self-pay

## 2022-09-22 ENCOUNTER — Other Ambulatory Visit: Payer: Self-pay

## 2022-09-23 ENCOUNTER — Inpatient Hospital Stay: Payer: 59 | Attending: Oncology

## 2022-09-23 VITALS — BP 151/100 | HR 78 | Temp 98.1°F | Resp 18

## 2022-09-23 DIAGNOSIS — D509 Iron deficiency anemia, unspecified: Secondary | ICD-10-CM | POA: Diagnosis not present

## 2022-09-23 DIAGNOSIS — D508 Other iron deficiency anemias: Secondary | ICD-10-CM

## 2022-09-23 MED ORDER — SODIUM CHLORIDE 0.9 % IV SOLN
Freq: Once | INTRAVENOUS | Status: AC
  Filled 2022-09-23: qty 250

## 2022-09-23 MED ORDER — SODIUM CHLORIDE 0.9 % IV SOLN
200.0000 mg | Freq: Once | INTRAVENOUS | Status: AC
  Administered 2022-09-23: 200 mg via INTRAVENOUS
  Filled 2022-09-23: qty 200

## 2022-09-23 NOTE — Progress Notes (Signed)
Patient is here for Iron, broken Femar pain 8/10 asking for IV pain med. Patient asked to notify MD. Offered Tylenol ordered by MD, patient refused. Informed to reach out to PCP or ortho. BP elevated, patient is usually high, denies any other symptoms. Tolerated Iron infusion well. Discharged. AVS given.

## 2022-09-27 ENCOUNTER — Other Ambulatory Visit: Payer: Self-pay

## 2022-09-28 ENCOUNTER — Other Ambulatory Visit: Payer: Self-pay

## 2022-09-29 ENCOUNTER — Other Ambulatory Visit: Payer: Self-pay

## 2022-09-29 ENCOUNTER — Encounter: Payer: Self-pay | Admitting: Internal Medicine

## 2022-09-29 MED ORDER — HYDROCHLOROTHIAZIDE 25 MG PO TABS: 25.0000 mg | ORAL_TABLET | Freq: Every day | ORAL | 3 refills | Status: DC

## 2022-09-29 MED ORDER — LISINOPRIL 20 MG PO TABS: 20.0000 mg | ORAL_TABLET | Freq: Every day | ORAL | 3 refills | Status: DC

## 2022-10-03 ENCOUNTER — Telehealth: Payer: Self-pay

## 2022-10-03 ENCOUNTER — Other Ambulatory Visit: Payer: Self-pay | Admitting: Gastroenterology

## 2022-10-03 NOTE — Telephone Encounter (Signed)
Received a PA for creon. Did PA through cover my meds. Waiting on response from insurance company

## 2022-10-04 ENCOUNTER — Other Ambulatory Visit: Payer: Self-pay

## 2022-10-06 ENCOUNTER — Other Ambulatory Visit: Payer: Self-pay

## 2022-10-07 ENCOUNTER — Other Ambulatory Visit: Payer: Self-pay

## 2022-10-12 ENCOUNTER — Encounter: Payer: Self-pay | Admitting: Gastroenterology

## 2022-10-12 ENCOUNTER — Ambulatory Visit: Payer: 59 | Admitting: Gastroenterology

## 2022-10-12 VITALS — BP 129/88 | HR 84 | Temp 98.1°F | Ht 71.0 in | Wt 175.0 lb

## 2022-10-12 DIAGNOSIS — Z86010 Personal history of colonic polyps: Secondary | ICD-10-CM | POA: Diagnosis not present

## 2022-10-12 DIAGNOSIS — K640 First degree hemorrhoids: Secondary | ICD-10-CM | POA: Diagnosis not present

## 2022-10-12 DIAGNOSIS — K8681 Exocrine pancreatic insufficiency: Secondary | ICD-10-CM

## 2022-10-12 DIAGNOSIS — K2210 Ulcer of esophagus without bleeding: Secondary | ICD-10-CM

## 2022-10-12 DIAGNOSIS — D509 Iron deficiency anemia, unspecified: Secondary | ICD-10-CM

## 2022-10-12 MED ORDER — DICYCLOMINE HCL 10 MG PO CAPS: ORAL_CAPSULE | ORAL | 3 refills | Status: AC

## 2022-10-12 MED ORDER — HYDROCORTISONE ACETATE 25 MG RE SUPP: 25.0000 mg | Freq: Every day | RECTAL | 0 refills | Status: DC
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
## 2022-10-13 ENCOUNTER — Encounter: Payer: Self-pay | Admitting: Internal Medicine

## 2022-10-14 ENCOUNTER — Encounter: Payer: Self-pay | Admitting: Internal Medicine

## 2022-10-14 ENCOUNTER — Ambulatory Visit (INDEPENDENT_AMBULATORY_CARE_PROVIDER_SITE_OTHER): Payer: 59 | Admitting: Internal Medicine

## 2022-10-14 DIAGNOSIS — M4306 Spondylolysis, lumbar region: Secondary | ICD-10-CM | POA: Diagnosis not present

## 2022-10-14 MED ORDER — HYDROCODONE-ACETAMINOPHEN 5-325 MG PO TABS: 1.0000 | ORAL_TABLET | Freq: Two times a day (BID) | ORAL | 0 refills | Status: DC | PRN

## 2022-10-14 NOTE — Assessment & Plan Note (Signed)
Hoping to get surgery still but delayed Some help from hydrocodone---I will take over this  Hasn't had any for a while Will use the lower dose---5/325 but 1-2 bid prn Will do contract

## 2022-10-14 NOTE — Progress Notes (Signed)
Subjective:    Patient ID: Harold Smith, male    DOB: [birth year], 56 y.o.   MRN: [id number]  HPI Here to discuss chronic pain management  Knee is doing some better Walking with a cane Now off the brace Still working with PT  Ongoing back pain Still working trying to schedule surgery--since Dr Louanne Skye is retiring Still up in the air  Got hydrocodone for knee---stretching them out Feels this is better than the percocet  Current Outpatient Medications on File Prior to Visit  Medication Sig Dispense Refill   cetirizine (ZYRTEC) 10 MG tablet Take 10 mg by mouth daily.     Cholecalciferol 125 MCG (5000 UT) TABS Take 1 tablet by mouth daily. 30 tablet 6   Cyanocobalamin (VITAMIN B-12 PO) Take 1 tablet by mouth daily.     cyclobenzaprine (FLEXERIL) 10 MG tablet Take 1 tablet (10 mg total) by mouth at bedtime as needed for muscle spasms. 30 tablet 0   dicyclomine (BENTYL) 10 MG capsule Take 1 to 2 capsules as needed for abdominal cramps and diarrhea every 6 to 8 hours 240 capsule 3   docusate sodium (COLACE) 100 MG capsule Take 1 capsule (100 mg total) by mouth 2 (two) times daily. 10 capsule 0   DULoxetine (CYMBALTA) 60 MG capsule Take 60 mg by mouth daily.     ergocalciferol (VITAMIN D2) 1.25 MG (50000 UT) capsule Take 1 capsule (50,000 Units total) by mouth every Monday. 4 capsule 2   FEROSUL 325 (65 Fe) MG tablet TAKE 1 TABLET BY MOUTH DAILY WITH BREAKFAST (Patient taking differently: Take 325 mg by mouth daily with breakfast.) 30 tablet 11   fluticasone (FLONASE) 50 MCG/ACT nasal spray Place 2 sprays into both nostrils daily. 48 g 3   gabapentin (NEURONTIN) 300 MG capsule Take 1 capsule twice daily and 4 caps at bedtime 180 capsule 5   hydrochlorothiazide (HYDRODIURIL) 25 MG tablet Take 1 tablet (25 mg total) by mouth daily. 90 tablet 3   hydrocortisone (ANUSOL-HC) 25 MG suppository Place 1 suppository (25 mg total) rectally daily. 14 suppository 0   ipratropium (ATROVENT) 0.06 %  nasal spray Place 2 sprays into both nostrils daily.     lipase/protease/amylase (CREON) 36000 UNITS CPEP capsule Take 2 capsules (72,000 Units total) by mouth 3 (three) times daily with meals. May also take 1 capsule (36,000 Units total) as needed (with snacks). 240 capsule 5   lisinopril (ZESTRIL) 20 MG tablet Take 1 tablet (20 mg total) by mouth daily. 90 tablet 3   meloxicam (MOBIC) 15 MG tablet Take 1 tablet (15 mg total) by mouth daily as needed for pain. 90 tablet 3   methocarbamol (ROBAXIN) 500 MG tablet Take 1 tablet (500 mg total) by mouth every 6 (six) hours as needed for muscle spasms. 60 tablet 1   Multiple Vitamin (MULTIVITAMIN) tablet Take 1 tablet by mouth daily.     omeprazole (PRILOSEC) 40 MG capsule TAKE 1 CAPSULE BY MOUTH TWO TIMES DAILY BEFORE A MEAL 60 capsule 0   QUEtiapine (SEROQUEL) 25 MG tablet Take 3 tablets (75 mg total) by mouth at bedtime. 90 tablet 5   sildenafil (REVATIO) 20 MG tablet Take 3-5 tablets as needed 1 hour prior to intercourse 60 tablet 6   Sodium Fluoride (PREVIDENT 5000 BOOSTER PLUS DT) Place 1 application  onto teeth 2 (two) times daily.     traZODone (DESYREL) 50 MG tablet Take 50 mg by mouth at bedtime.     Zinc Sulfate 220 (  50 Zn) MG TABS Take 1 tablet (220 mg total) by mouth daily. 30 tablet 1   zolpidem (AMBIEN CR) 12.5 MG CR tablet Take 1 tablet (12.5 mg total) by mouth at bedtime as needed for sleep. 30 tablet 0   Rivaroxaban (XARELTO) 15 MG TABS tablet Take 1 tablet (15 mg total) by mouth daily with supper. 30 tablet 0   No current facility-administered medications on file prior to visit.    No Known Allergies  Past Medical History:  Diagnosis Date   Allergic rhinitis due to pollen    Anxiety    Colon polyps    Diverticulitis large intestine    HTN (hypertension)    Metabolic bone disease 1/0/2585   Obesity    Osteoarthritis, knee    Sleep apnea    does not use cpap   Sleep disturbance    Vitamin D deficiency 09/01/2022    Past  Surgical History:  Procedure Laterality Date   ACHILLES TENDON REPAIR Right 12/26/2006   CATARACT EXTRACTION W/PHACO Left 07/20/2021   Procedure: CATARACT EXTRACTION PHACO AND INTRAOCULAR LENS PLACEMENT ([place]) LEFT 2.11 00:24.0;  Surgeon: Birder Robson, MD;  Location: [place];  Service: Ophthalmology;  Laterality: Left;  sleep apnea   CATARACT EXTRACTION W/PHACO Right 08/03/2021   Procedure: CATARACT EXTRACTION PHACO AND INTRAOCULAR LENS PLACEMENT ([facility]) RIGHT;  Surgeon: Birder Robson, MD;  Location: [place];  Service: Ophthalmology;  Laterality: Right;  3.19 0:33.0   CHOLECYSTECTOMY     COLON RESECTION Left 12/26/2004   due to diverticular disease at [place]  2013?   COLONOSCOPY WITH PROPOFOL N/A 05/22/2018   Procedure: COLONOSCOPY WITH PROPOFOL;  Surgeon: Lin Landsman, MD;  Location: [facility] ENDOSCOPY;  Service: Gastroenterology;  Laterality: N/A;   COLONOSCOPY WITH PROPOFOL N/A 10/22/2020   Procedure: COLONOSCOPY WITH PROPOFOL;  Surgeon: Lin Landsman, MD;  Location: [place];  Service: Endoscopy;  Laterality: N/A;  priority 4   COLONOSCOPY WITH PROPOFOL N/A 11/10/2021   Procedure: COLONOSCOPY WITH PROPOFOL;  Surgeon: Lin Landsman, MD;  Location[facility] ENDOSCOPY;  Service: Gastroenterology;  Laterality: N/A;   COLONOSCOPY WITH PROPOFOL N/A 06/16/2022   Procedure: COLONOSCOPY WITH PROPOFOL;  Surgeon: Lin Landsman, MD;  Location: [place];  Service: Endoscopy;  Laterality: N/A;   DRUG INDUCED ENDOSCOPY N/A 06/10/2019   Procedure: DRUG INDUCED SLEEP ENDOSCOPY;  Surgeon: Jerrell Belfast, MD;  Location: [place];  Service: ENT;  Laterality: N/A;   ESOPHAGOGASTRODUODENOSCOPY (EGD) WITH PROPOFOL N/A 06/16/2022   Procedure: ESOPHAGOGASTRODUODENOSCOPY (EGD) WITH PROPOFOL;  Surgeon: Lin Landsman, MD;  Location: [place];  Service: Endoscopy;  Laterality: N/A;   EYE SURGERY      GASTRIC BYPASS N/A 12/27/2007   HIATAL HERNIA REPAIR  09/25/2014   at [place] Right 08/30/2022   Procedure: OPEN REDUCTION INTERNAL FIXATION (ORIF) DISTAL FEMUR FRACTURE;  Surgeon: Altamese Sammamish, MD;  Location: [place];  Service: Orthopedics;  Laterality: Right;   POLYPECTOMY  10/22/2020   Procedure: POLYPECTOMY;  Surgeon: Lin Landsman, MD;  Location: [place];  Service: Endoscopy;;   POLYPECTOMY  06/16/2022   Procedure: POLYPECTOMY;  Surgeon: Lin Landsman, MD;  Location: [place];  Service: Endoscopy;;   ROTATOR CUFF REPAIR     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    Family History  Problem Relation Age of Onset   Hypertension Mother    Diabetes Mother  Kidney disease Mother    Heart disease Mother    Colon cancer Father 62   Asthma Sister    Obesity Sister    Stroke Sister    Obesity Brother    Diabetes Other    Heart disease Other    Hypertension Other    Kidney disease Other    Cancer Paternal Uncle        unk type, possible prostate   Lung cancer Paternal Grandmother     Social History   Socioeconomic History   Marital status: Single    Spouse name: Not on file   Number of children: 3   Years of education: Not on file   Highest education level: Not on file  Occupational History   Occupation: Production designer, theatre/television/film  Tobacco Use   Smoking status: Some Days    Types: Cigars    Passive exposure: Past   Smokeless tobacco: Never   Tobacco comments:    occasional cigar  Vaping Use   Vaping Use: Never used  Substance and Sexual Activity   Alcohol use: Yes    Alcohol/week: 0.0 standard drinks of alcohol    Comment: occasional   Drug use: No   Sexual activity: Yes  Other Topics Concern   Not on file  Social History Narrative   Lives with 2 daughters from 35st marriage   Shared custody of another daughter from 2nd marriage   Social Determinants of Health   Financial Resource Strain: Not on  file  Food Insecurity: Not on file  Transportation Needs: Not on file  Physical Activity: Not on file  Stress: Not on file  Social Connections: Not on file  Intimate Partner Violence: Not on file   Review of Systems Not sleeping well still--tries to limit the [employer] Appetite is "crazy"---has seen GI. ----pain after eating from hemorrhoids at times Hemorrhoids have flared--some blood in bowl Has lost some weight    Objective:   Physical Exam Constitutional:      Appearance: Normal appearance.  Neurological:     Mental Status: He is alert.  Psychiatric:     Comments: Frustrated but no overtly depressed No suicidal ideation            Assessment & Plan:

## 2022-10-24 ENCOUNTER — Encounter (INDEPENDENT_AMBULATORY_CARE_PROVIDER_SITE_OTHER): Payer: Self-pay

## 2022-10-28 ENCOUNTER — Other Ambulatory Visit: Payer: Self-pay | Admitting: Orthopaedic Surgery

## 2022-10-31 ENCOUNTER — Ambulatory Visit (INDEPENDENT_AMBULATORY_CARE_PROVIDER_SITE_OTHER): Payer: 59 | Admitting: Orthopaedic Surgery

## 2022-10-31 ENCOUNTER — Encounter: Payer: Self-pay | Admitting: Orthopaedic Surgery

## 2022-10-31 ENCOUNTER — Ambulatory Visit (INDEPENDENT_AMBULATORY_CARE_PROVIDER_SITE_OTHER): Payer: 59

## 2022-10-31 VITALS — BP 95/68 | HR 137 | Ht 71.0 in | Wt 165.0 lb

## 2022-10-31 DIAGNOSIS — M5136 Other intervertebral disc degeneration, lumbar region: Secondary | ICD-10-CM | POA: Diagnosis not present

## 2022-10-31 DIAGNOSIS — M4306 Spondylolysis, lumbar region: Secondary | ICD-10-CM | POA: Diagnosis not present

## 2022-10-31 DIAGNOSIS — Z8781 Personal history of (healed) traumatic fracture: Secondary | ICD-10-CM | POA: Diagnosis not present

## 2022-10-31 DIAGNOSIS — Z9884 Bariatric surgery status: Secondary | ICD-10-CM | POA: Diagnosis not present

## 2022-10-31 DIAGNOSIS — E559 Vitamin D deficiency, unspecified: Secondary | ICD-10-CM | POA: Diagnosis not present

## 2022-10-31 LAB — CBC WITH DIFFERENTIAL/PLATELET
Absolute Monocytes: 726 cells/uL (ref 200–950)
Basophils Absolute: 33 cells/uL (ref 0–200)
Basophils Relative: 0.3 %
Eosinophils Absolute: 77 cells/uL (ref 15–500)
Eosinophils Relative: 0.7 %
HCT: 42.3 % (ref 38.5–50.0)
Hemoglobin: 14.5 g/dL (ref 13.2–17.1)
Lymphs Abs: 1232 cells/uL (ref 850–3900)
MCH: 32.6 pg (ref 27.0–33.0)
MCHC: 34.3 g/dL (ref 32.0–36.0)
MCV: 95.1 fL (ref 80.0–100.0)
MPV: 10.1 fL (ref 7.5–12.5)
Monocytes Relative: 6.6 %
Neutro Abs: 8932 cells/uL — ABNORMAL HIGH (ref 1500–7800)
Neutrophils Relative %: 81.2 %
Platelets: 300 10*3/uL (ref 140–400)
RBC: 4.45 10*6/uL (ref 4.20–5.80)
RDW: 13.6 % (ref 11.0–15.0)
Total Lymphocyte: 11.2 %
WBC: 11 10*3/uL — ABNORMAL HIGH (ref 3.8–10.8)

## 2022-10-31 LAB — COMPREHENSIVE METABOLIC PANEL
AG Ratio: 1.2 (calc) (ref 1.0–2.5)
ALT: 27 U/L (ref 9–46)
AST: 17 U/L (ref 10–35)
Albumin: 4.2 g/dL (ref 3.6–5.1)
Alkaline phosphatase (APISO): 131 U/L (ref 35–144)
BUN: 25 mg/dL (ref 7–25)
CO2: 21 mmol/L (ref 20–32)
Calcium: 8.9 mg/dL (ref 8.6–10.3)
Chloride: 103 mmol/L (ref 98–110)
Creat: 1.13 mg/dL (ref 0.70–1.30)
Globulin: 3.4 g/dL (calc) (ref 1.9–3.7)
Glucose, Bld: 105 mg/dL — ABNORMAL HIGH (ref 65–99)
Potassium: 5.2 mmol/L (ref 3.5–5.3)
Sodium: 133 mmol/L — ABNORMAL LOW (ref 135–146)
Total Bilirubin: 0.4 mg/dL (ref 0.2–1.2)
Total Protein: 7.6 g/dL (ref 6.1–8.1)
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
## 2022-10-31 NOTE — Addendum Note (Signed)
Addended by: Meyer Cory on: 10/31/2022 02:40 PM   Modules accepted: Orders

## 2022-11-03 ENCOUNTER — Other Ambulatory Visit: Payer: Self-pay

## 2022-11-03 ENCOUNTER — Encounter: Payer: Self-pay | Admitting: Oncology

## 2022-11-03 ENCOUNTER — Telehealth: Payer: Self-pay

## 2022-11-03 DIAGNOSIS — Z86010 Personal history of colonic polyps: Secondary | ICD-10-CM

## 2022-11-03 MED ORDER — GOLYTELY 236 G PO SOLR: 8000.0000 mL | Freq: Once | ORAL | 0 refills | Status: AC

## 2022-11-03 NOTE — Telephone Encounter (Signed)
Patient is calling to schedule colonoscopy. Last office visit note states he needs a 2 day prep. Sent 2 day prep to the pharmacy. Patient states he is no longer taking Xarelto anymore. Went over instructions, sent to [employer] and mailed them

## 2022-11-21 ENCOUNTER — Ambulatory Visit
Admission: RE | Admit: 2022-11-21 | Discharge: 2022-11-21 | Disposition: A | Discharge status: Home / Self Care | Payer: 59 | Source: Ambulatory Visit | Attending: Orthopaedic Surgery | Admitting: Orthopaedic Surgery

## 2022-11-21 DIAGNOSIS — M5136 Other intervertebral disc degeneration, lumbar region: Secondary | ICD-10-CM | POA: Insufficient documentation

## 2022-11-21 DIAGNOSIS — M5126 Other intervertebral disc displacement, lumbar region: Secondary | ICD-10-CM | POA: Diagnosis not present

## 2022-11-21 DIAGNOSIS — M4306 Spondylolysis, lumbar region: Secondary | ICD-10-CM | POA: Diagnosis present

## 2022-11-22 ENCOUNTER — Telehealth: Payer: Self-pay | Admitting: Gastroenterology

## 2022-11-22 NOTE — Telephone Encounter (Signed)
Informed patient that I mailed them but they are also on mychart. Told him it is under communication and then letters. He states he will take a look for them. Also sent it in my chart message

## 2022-11-22 NOTE — Telephone Encounter (Signed)
Patient called stating he does not have any instructions for his colonoscopy and also has a few questions. Requesting call back.

## 2022-11-23 ENCOUNTER — Ambulatory Visit
Admission: RE | Admit: 2022-11-23 | Discharge: 2022-11-23 | Disposition: A | Discharge status: Home / Self Care | Payer: 59 | Source: Ambulatory Visit | Attending: Orthopaedic Surgery | Admitting: Orthopaedic Surgery

## 2022-11-23 DIAGNOSIS — Z8781 Personal history of (healed) traumatic fracture: Secondary | ICD-10-CM | POA: Diagnosis not present

## 2022-11-23 DIAGNOSIS — M810 Age-related osteoporosis without current pathological fracture: Secondary | ICD-10-CM | POA: Diagnosis not present

## 2022-11-24 ENCOUNTER — Encounter: Payer: Self-pay | Admitting: Orthopedic Surgery

## 2022-11-24 ENCOUNTER — Ambulatory Visit (INDEPENDENT_AMBULATORY_CARE_PROVIDER_SITE_OTHER): Payer: 59

## 2022-11-24 ENCOUNTER — Ambulatory Visit (INDEPENDENT_AMBULATORY_CARE_PROVIDER_SITE_OTHER): Payer: 59 | Admitting: Orthopedic Surgery

## 2022-11-24 VITALS — BP 119/85 | HR 102 | Ht 71.0 in | Wt 165.0 lb

## 2022-11-24 DIAGNOSIS — M4712 Other spondylosis with myelopathy, cervical region: Secondary | ICD-10-CM

## 2022-11-24 DIAGNOSIS — M5416 Radiculopathy, lumbar region: Secondary | ICD-10-CM | POA: Diagnosis not present
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
## 2022-11-25 ENCOUNTER — Telehealth: Payer: Self-pay | Admitting: Orthopedic Surgery

## 2022-11-25 DIAGNOSIS — Z419 Encounter for procedure for purposes other than remedying health state, unspecified: Secondary | ICD-10-CM | POA: Diagnosis not present

## 2022-11-25 NOTE — Telephone Encounter (Signed)
Patient has submitted LTD forms. Please advise work status and provide note. Thank you

## 2022-11-28 ENCOUNTER — Ambulatory Visit: Payer: 59 | Admitting: Orthopedic Surgery

## 2022-11-28 ENCOUNTER — Encounter: Payer: Self-pay | Admitting: Radiology

## 2022-11-29 ENCOUNTER — Ambulatory Visit
Admission: RE | Admit: 2022-11-29 | Discharge: 2022-11-29 | Disposition: A | Discharge status: Home / Self Care | Payer: 59 | Attending: Gastroenterology | Admitting: Gastroenterology

## 2022-11-29 ENCOUNTER — Other Ambulatory Visit: Payer: Self-pay

## 2022-11-29 ENCOUNTER — Ambulatory Visit: Payer: 59 | Admitting: Certified Registered"

## 2022-11-29 ENCOUNTER — Encounter: Payer: Self-pay | Admitting: Gastroenterology

## 2022-11-29 ENCOUNTER — Encounter
Admission: RE | Disposition: A | Discharge status: Home / Self Care | Payer: Self-pay | Source: Home / Self Care | Attending: Gastroenterology

## 2022-11-29 DIAGNOSIS — G4730 Sleep apnea, unspecified: Secondary | ICD-10-CM | POA: Diagnosis not present

## 2022-11-29 DIAGNOSIS — Z9884 Bariatric surgery status: Secondary | ICD-10-CM | POA: Diagnosis not present

## 2022-11-29 DIAGNOSIS — Z1211 Encounter for screening for malignant neoplasm of colon: Secondary | ICD-10-CM | POA: Insufficient documentation

## 2022-11-29 DIAGNOSIS — Z86010 Personal history of colonic polyps: Secondary | ICD-10-CM | POA: Insufficient documentation

## 2022-11-29 DIAGNOSIS — F419 Anxiety disorder, unspecified: Secondary | ICD-10-CM | POA: Insufficient documentation

## 2022-11-29 DIAGNOSIS — Z860101 Personal history of adenomatous and serrated colon polyps: Secondary | ICD-10-CM

## 2022-11-29 DIAGNOSIS — I10 Essential (primary) hypertension: Secondary | ICD-10-CM | POA: Diagnosis not present

## 2022-11-29 DIAGNOSIS — Z5309 Procedure and treatment not carried out because of other contraindication: Secondary | ICD-10-CM | POA: Insufficient documentation

## 2022-11-29 DIAGNOSIS — F17290 Nicotine dependence, other tobacco product, uncomplicated: Secondary | ICD-10-CM | POA: Diagnosis not present

## 2022-11-29 DIAGNOSIS — K279 Peptic ulcer, site unspecified, unspecified as acute or chronic, without hemorrhage or perforation: Secondary | ICD-10-CM | POA: Insufficient documentation

## 2022-11-29 DIAGNOSIS — R69 Illness, unspecified: Secondary | ICD-10-CM | POA: Diagnosis not present

## 2022-11-29 DIAGNOSIS — K635 Polyp of colon: Secondary | ICD-10-CM | POA: Diagnosis not present

## 2022-11-29 HISTORY — PX: COLONOSCOPY WITH PROPOFOL: SHX5780

## 2022-11-29 SURGERY — COLONOSCOPY WITH PROPOFOL
Anesthesia: General

## 2022-11-29 MED ORDER — LIDOCAINE HCL (CARDIAC) PF 100 MG/5ML IV SOSY
PREFILLED_SYRINGE | INTRAVENOUS | Status: DC | PRN
  Administered 2022-11-29: 50 mg via INTRAVENOUS

## 2022-11-29 MED ORDER — PROPOFOL 10 MG/ML IV BOLUS
INTRAVENOUS | Status: DC | PRN
  Administered 2022-11-29: 70 mg via INTRAVENOUS
  Administered 2022-11-29: 30 mg via INTRAVENOUS

## 2022-11-29 MED ORDER — PROPOFOL 500 MG/50ML IV EMUL
INTRAVENOUS | Status: DC | PRN
  Administered 2022-11-29: 150 ug/kg/min via INTRAVENOUS

## 2022-11-29 MED ORDER — GOLYTELY 236 G PO SOLR: 4000.0000 mL | Freq: Once | ORAL | 0 refills | Status: AC

## 2022-11-29 MED ORDER — SODIUM CHLORIDE 0.9 % IV SOLN
INTRAVENOUS | Status: DC
  Administered 2022-11-29: 1000 mL via INTRAVENOUS

## 2022-11-29 MED ORDER — PROPOFOL 1000 MG/100ML IV EMUL
INTRAVENOUS | Status: AC
  Filled 2022-11-29: qty 100

## 2022-11-29 MED ORDER — LIDOCAINE HCL (PF) 2 % IJ SOLN
INTRAMUSCULAR | Status: AC
  Filled 2022-11-29: qty 5

## 2022-11-29 NOTE — Op Note (Signed)
[facility] Gastroenterology Patient Name: Harold Smith Procedure Date: 11/29/2022 7:50 AM MRN: [id number] Account #: [id number] Date of Birth: 11-08-1966 Admit Type: Outpatient Age: 56 Room: [facility] ENDO ROOM 3 Gender: Male Note Status: Finalized Instrument Name: Jasper Riling [id number] Procedure:             Colonoscopy Indications:           Surveillance: History of adenomatous polyps, last                         colonoscopy incomplete (<3 yr), Last colonoscopy: June                         2023 Providers:             Lin Landsman MD, MD Referring MD:          [name] (Referring MD) Medicines:             General Anesthesia Complications:         No immediate complications. Estimated blood loss: None. Procedure:             Pre-Anesthesia Assessment:                        - Prior to the procedure, a History and Physical was                         performed, and patient medications and allergies were                         reviewed. The patient is competent. The risks and                         benefits of the procedure and the sedation options and                         risks were discussed with the patient. All questions                         were answered and informed consent was obtained.                         Patient identification and proposed procedure were                         verified by the physician, the nurse, the                         anesthesiologist, the anesthetist and the technician                         in the pre-procedure area in the procedure room in the                         endoscopy suite. Mental Status Examination: alert and                         oriented. Airway Examination: normal oropharyngeal  airway and neck mobility. Respiratory Examination:                         clear to auscultation. CV Examination: normal.                         Prophylactic Antibiotics: The patient does not  require                         prophylactic antibiotics. Prior Anticoagulants: The                         patient has taken no anticoagulant or antiplatelet                         agents. ASA Grade Assessment: III - A patient with                         severe systemic disease. After reviewing the risks and                         benefits, the patient was deemed in satisfactory                         condition to undergo the procedure. The anesthesia                         plan was to use general anesthesia. Immediately prior                         to administration of medications, the patient was                         re-assessed for adequacy to receive sedatives. The                         heart rate, respiratory rate, oxygen saturations,                         blood pressure, adequacy of pulmonary ventilation, and                         response to care were monitored throughout the                         procedure. The physical status of the patient was                         re-assessed after the procedure.                        After obtaining informed consent, the colonoscope was                         passed under direct vision. Throughout the procedure,                         the patient's blood pressure, pulse, and oxygen  saturations were monitored continuously. The procedure                         was aborted. The colonscope was not inserted.                         Medications were given. The colonoscopy was extremely                         difficult due to inadequate bowel prep. The patient                         tolerated the procedure well. The quality of the bowel                         preparation was poor. Findings:      Procedure was incomplete due to poor prep      The perianal and digital rectal examinations were normal. Pertinent       negatives include normal sphincter tone and no palpable rectal lesions.      Copious  quantities of solid stool was found in the entire examined       colon, precluding visualization, scope withdrawn.      The retroflexed view of the distal rectum and anal verge was normal and       showed no anal or rectal abnormalities. Impression:            - Preparation of the colon was poor.                        - Stool in the entire examined colon.                        - The distal rectum and anal verge are normal on                         retroflexion view.                        - No specimens collected. Recommendation:        - Discharge patient to home (with escort).                        - Clear liquid diet today.                        - Repeat colonoscopy tomorrow or in 2 days or next                         available with 2 day prep because the bowel                         preparation was suboptimal. Diagnosis Code(s):     --- Professional ---                        Z86.010, Personal history of colonic polyps Dr. Ulyess Mort Lin Landsman MD, MD 11/29/2022 8:06:20 AM This report has been signed electronically. Number of Addenda: 0 Note Initiated On: 11/29/2022 7:50 AM Total Procedure Duration: 0  hours 2 minutes 39 seconds  Estimated Blood Loss:  Estimated blood loss: none. Estimated blood loss: none.      [facility]

## 2022-11-29 NOTE — Telephone Encounter (Signed)
Noted for Ciox ?

## 2022-11-29 NOTE — Transfer of Care (Signed)
Immediate Anesthesia Transfer of Care Note  Patient: Kelsen Celona  Procedure(s) Performed: COLONOSCOPY WITH PROPOFOL  Patient Location: PACU and Endoscopy Unit  Anesthesia Type:General  Level of Consciousness: drowsy and patient cooperative  Airway & Oxygen Therapy: Patient Spontanous Breathing  Post-op Assessment: Report given to RN and Post -op Vital signs reviewed and stable  Post vital signs: Reviewed and stable  Last Vitals:  Vitals Value Taken Time  BP 96/57   Temp    Pulse 78 11/29/22 0807  Resp 18 11/29/22 0807  SpO2 97 % 11/29/22 0807  Vitals shown include unvalidated device data.  Last Pain:  Vitals:   11/29/22 0711  TempSrc: Temporal  PainSc: 7          Complications: No notable events documented.

## 2022-11-29 NOTE — H&P (Signed)
Harold Darby, MD [address]  [place]  [place], [place] [address]  Main: [phone]  Fax: [phone] Pager: [phone]  Primary Care Physician:  [name], MD Primary Gastroenterologist:  Dr. Cephas Smith  Pre-Procedure History & Physical: HPI:  Harold Smith is a 56 y.o. male is here for an colonoscopy.   Past Medical History:  Diagnosis Date   Allergic rhinitis due to pollen    Anxiety    Colon polyps    Diverticulitis large intestine    HTN (hypertension)    Metabolic bone disease 0/12/6008   Obesity    Osteoarthritis, knee    Sleep apnea    does not use cpap   Sleep disturbance    Vitamin D deficiency 09/01/2022    Past Surgical History:  Procedure Laterality Date   ACHILLES TENDON REPAIR Right 12/26/2006   CATARACT EXTRACTION W/PHACO Left 07/20/2021   Procedure: CATARACT EXTRACTION PHACO AND INTRAOCULAR LENS PLACEMENT ([place]) LEFT 2.11 00:24.0;  Surgeon: Birder Robson, MD;  Location: [place];  Service: Ophthalmology;  Laterality: Left;  sleep apnea   CATARACT EXTRACTION W/PHACO Right 08/03/2021   Procedure: CATARACT EXTRACTION PHACO AND INTRAOCULAR LENS PLACEMENT ([facility]) RIGHT;  Surgeon: Birder Robson, MD;  Location: [place];  Service: Ophthalmology;  Laterality: Right;  3.19 0:33.0   CHOLECYSTECTOMY     COLON RESECTION Left 12/26/2004   due to diverticular disease at [place]  2013?   COLONOSCOPY WITH PROPOFOL N/A 05/22/2018   Procedure: COLONOSCOPY WITH PROPOFOL;  Surgeon: Lin Landsman, MD;  Location: [facility] ENDOSCOPY;  Service: Gastroenterology;  Laterality: N/A;   COLONOSCOPY WITH PROPOFOL N/A 10/22/2020   Procedure: COLONOSCOPY WITH PROPOFOL;  Surgeon: Lin Landsman, MD;  Location: [place];  Service: Endoscopy;  Laterality: N/A;  priority 4   COLONOSCOPY WITH PROPOFOL N/A 11/10/2021   Procedure: COLONOSCOPY WITH PROPOFOL;  Surgeon: Lin Landsman,  MD;  Location[facility] ENDOSCOPY;  Service: Gastroenterology;  Laterality: N/A;   COLONOSCOPY WITH PROPOFOL N/A 06/16/2022   Procedure: COLONOSCOPY WITH PROPOFOL;  Surgeon: Lin Landsman, MD;  Location: [place];  Service: Endoscopy;  Laterality: N/A;   DRUG INDUCED ENDOSCOPY N/A 06/10/2019   Procedure: DRUG INDUCED SLEEP ENDOSCOPY;  Surgeon: Jerrell Belfast, MD;  Location: [place];  Service: ENT;  Laterality: N/A;   ESOPHAGOGASTRODUODENOSCOPY (EGD) WITH PROPOFOL N/A 06/16/2022   Procedure: ESOPHAGOGASTRODUODENOSCOPY (EGD) WITH PROPOFOL;  Surgeon: Lin Landsman, MD;  Location: [place];  Service: Endoscopy;  Laterality: N/A;   EYE SURGERY     GASTRIC BYPASS N/A 12/27/2007   HIATAL HERNIA REPAIR  09/25/2014   at [place] Right 08/30/2022   Procedure: OPEN REDUCTION INTERNAL FIXATION (ORIF) DISTAL FEMUR FRACTURE;  Surgeon: Altamese Wyldwood, MD;  Location: [place];  Service: Orthopedics;  Laterality: Right;   POLYPECTOMY  10/22/2020   Procedure: POLYPECTOMY;  Surgeon: Lin Landsman, MD;  Location: [place];  Service: Endoscopy;;   POLYPECTOMY  06/16/2022   Procedure: POLYPECTOMY;  Surgeon: Lin Landsman, MD;  Location: [place];  Service: Endoscopy;;   ROTATOR CUFF REPAIR     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 Authorizing Provider  cetirizine (ZYRTEC) 10 MG tablet Take 10 mg by mouth daily.   Yes [provider]  Cholecalciferol 125  MCG (5000 UT) TABS Take 1 tablet by mouth daily. 09/01/22  Yes Ainsley Spinner, PA-C  Cyanocobalamin (VITAMIN B-12 PO) Take 1 tablet by mouth daily.   Yes [provider]  cyclobenzaprine (FLEXERIL) 10 MG tablet Take 1 tablet (10 mg total) by mouth at bedtime as needed for muscle spasms. 09/19/22  Yes [name], MD  dicyclomine (BENTYL) 10 MG capsule Take 1  to 2 capsules as needed for abdominal cramps and diarrhea every 6 to 8 hours 10/12/22  Yes Destony Prevost, Tally Due, MD  docusate sodium (COLACE) 100 MG capsule Take 1 capsule (100 mg total) by mouth 2 (two) times daily. 09/01/22  Yes Ainsley Spinner, PA-C  DULoxetine (CYMBALTA) 60 MG capsule Take 60 mg by mouth daily. 10/03/22  Yes [provider]  ergocalciferol (VITAMIN D2) 1.25 MG (50000 UT) capsule Take 1 capsule (50,000 Units total) by mouth every Monday. 09/05/22  Yes Ainsley Spinner, PA-C  FEROSUL 325 (65 Fe) MG tablet TAKE 1 TABLET BY MOUTH DAILY WITH BREAKFAST Patient taking differently: Take 325 mg by mouth daily with breakfast. 06/29/22  Yes [name], MD  fluticasone (FLONASE) 50 MCG/ACT nasal spray Place 2 sprays into both nostrils daily. 07/11/22  Yes [name], MD  gabapentin (NEURONTIN) 300 MG capsule Take 1 capsule twice daily and 4 caps at bedtime 09/07/22  Yes [name], MD  hydrochlorothiazide (HYDRODIURIL) 25 MG tablet Take 1 tablet (25 mg total) by mouth daily. 09/29/22  Yes [name], MD  HYDROcodone-acetaminophen (NORCO/VICODIN) 5-325 MG tablet Take 1-2 tablets by mouth 2 (two) times daily as needed for moderate pain. 10/14/22  Yes [name], MD  hydrocortisone (ANUSOL-HC) 25 MG suppository Place 1 suppository (25 mg total) rectally daily. 10/12/22  Yes Leonetta Mcgivern, Tally Due, MD  ipratropium (ATROVENT) 0.06 % nasal spray Place 2 sprays into both nostrils daily.   Yes [provider]  lipase/protease/amylase (CREON) 36000 UNITS CPEP capsule Take 2 capsules (72,000 Units total) by mouth 3 (three) times daily with meals. May also take 1 capsule (36,000 Units total) as needed (with snacks). 07/15/22  Yes Kiarah Eckstein, Tally Due, MD  lisinopril (ZESTRIL) 20 MG tablet Take 1 tablet (20 mg total) by mouth daily. 09/29/22  Yes [name], MD  meloxicam (MOBIC) 15 MG tablet Take 1 tablet (15 mg total) by mouth daily as needed for pain. 10/09/22  Yes  Ainsley Spinner, PA-C  methocarbamol (ROBAXIN) 500 MG tablet Take 1 tablet (500 mg total) by mouth every 6 (six) hours as needed for muscle spasms. 09/19/22  Yes [name], MD  Multiple Vitamin (MULTIVITAMIN) tablet Take 1 tablet by mouth daily.   Yes [provider]  omeprazole (PRILOSEC) 40 MG capsule TAKE 1 CAPSULE BY MOUTH TWO TIMES DAILY BEFORE A MEAL 10/03/22  Yes Fumio Vandam, Tally Due, MD  QUEtiapine (SEROQUEL) 25 MG tablet Take 3 tablets (75 mg total) by mouth at bedtime. 09/19/22  Yes [name], MD  Sodium Fluoride (PREVIDENT 5000 BOOSTER PLUS DT) Place 1 application  onto teeth 2 (two) times daily.   Yes [provider]  traZODone (DESYREL) 50 MG tablet Take 50 mg by mouth at bedtime.   Yes [provider]  Zinc Sulfate 220 (50 Zn) MG TABS Take 1 tablet (220 mg total) by mouth daily. 09/02/22  Yes Ainsley Spinner, PA-C  zolpidem (AMBIEN CR) 12.5 MG CR tablet Take 1 tablet (12.5 mg total) by mouth at bedtime as needed for sleep. 09/02/22  Yes  [name], MD  Rivaroxaban (XARELTO) 15 MG TABS tablet Take 1 tablet (15 mg total) by mouth daily with supper. 09/01/22 10/12/22  Ainsley Spinner, PA-C  sildenafil (REVATIO) 20 MG tablet Take 3-5 tablets as needed 1 hour prior to intercourse 08/16/22   Hollice Espy, MD    Allergies as of 11/03/2022   (No Known Allergies)    Family History  Problem Relation Age of Onset   Hypertension Mother    Diabetes Mother    Kidney disease Mother    Heart disease Mother    Colon cancer Father 71   Asthma Sister    Obesity Sister    Stroke Sister    Obesity Brother    Diabetes Other    Heart disease Other    Hypertension Other    Kidney disease Other    Cancer Paternal Uncle        unk type, possible prostate   Lung cancer Paternal Grandmother     Social History   Socioeconomic History   Marital status: Single    Spouse name: Not on file   Number of children: 3   Years of education: Not on file   Highest  education level: Not on file  Occupational History   Occupation: Production designer, theatre/television/film  Tobacco Use   Smoking status: Some Days    Types: Cigars    Passive exposure: Past   Smokeless tobacco: Never   Tobacco comments:    occasional cigar  Vaping Use   Vaping Use: Never used  Substance and Sexual Activity   Alcohol use: Yes    Alcohol/week: 0.0 standard drinks of alcohol    Comment: occasional   Drug use: No   Sexual activity: Yes  Other Topics Concern   Not on file  Social History Narrative   Lives with 2 daughters from 1st marriage   Shared custody of another daughter from 2nd marriage   Social Determinants of Health   Financial Resource Strain: Not on file  Food Insecurity: Not on file  Transportation Needs: Not on file  Physical Activity: Not on file  Stress: Not on file  Social Connections: Not on file  Intimate Partner Violence: Not on file    Review of Systems: See HPI, otherwise negative ROS  Physical Exam: BP (!) 125/101   Pulse 90   Temp 97.9 F (36.6 C) (Temporal)   Resp 18   Ht '5\' 10"'$  (1.778 m)   Wt 77.3 kg   SpO2 98%   BMI 24.44 kg/m  General:   Alert,  pleasant and cooperative in NAD Head:  Normocephalic and atraumatic. Neck:  Supple; no masses or thyromegaly. Lungs:  Clear throughout to auscultation.    Heart:  Regular rate and rhythm. Abdomen:  Soft, nontender and nondistended. Normal bowel sounds, without guarding, and without rebound.   Neurologic:  Alert and  oriented x4;  grossly normal neurologically.  Impression/Plan: Harold Smith is here for an colonoscopy to be performed for h/o adenoma colon  Risks, benefits, limitations, and alternatives regarding  colonoscopy have been reviewed with the patient.  Questions have been answered.  All parties agreeable.   [name], MD  11/29/2022, 7:48 AM

## 2022-11-29 NOTE — Anesthesia Preprocedure Evaluation (Signed)
Anesthesia Evaluation  Patient identified by MRN, date of birth, ID band Patient awake    Reviewed: Allergy & Precautions, NPO status , Patient's Chart, lab work & pertinent test results  History of Anesthesia Complications Negative for: history of anesthetic complications  Airway Mallampati: III  TM Distance: >3 FB Neck ROM: full    Dental  (+) Dental Advidsory Given, Teeth Intact, Missing, Poor Dentition   Pulmonary neg shortness of breath, sleep apnea , neg COPD, neg recent URI, Current Smoker   Pulmonary exam normal        Cardiovascular hypertension, (-) angina (-) Past MI and (-) Cardiac Stents negative cardio ROS Normal cardiovascular exam(-) dysrhythmias (-) Valvular Problems/Murmurs     Neuro/Psych  PSYCHIATRIC DISORDERS Anxiety     negative neurological ROS     GI/Hepatic Neg liver ROS, PUD,,,  Endo/Other  negative endocrine ROS    Renal/GU negative Renal ROS  negative genitourinary   Musculoskeletal   Abdominal Normal abdominal exam  (+)   Peds  Hematology negative hematology ROS (+)   Anesthesia Other Findings Past Medical History: No date: Allergic rhinitis due to pollen No date: Colon polyps No date: Diverticulitis large intestine No date: Obesity No date: Osteoarthritis, knee No date: Sleep apnea     Comment:  does not use cpap No date: Sleep disturbance  Past Surgical History: 2008: ACHILLES TENDON REPAIR; Right 07/20/2021: CATARACT EXTRACTION W/PHACO; Left     Comment:  Procedure: CATARACT EXTRACTION PHACO AND INTRAOCULAR               LENS PLACEMENT (IOC) LEFT 2.11 00:24.0;  Surgeon:               Birder Robson, MD;  Location: [place];                Service: Ophthalmology;  Laterality: Left;  sleep apnea 08/03/2021: CATARACT EXTRACTION W/PHACO; Right     Comment:  Procedure: CATARACT EXTRACTION PHACO AND INTRAOCULAR               LENS PLACEMENT (IOC) RIGHT;  Surgeon:  Birder Robson,               MD;  Location: [place];  Service:               Ophthalmology;  Laterality: Right;  3.19 0:33.0 2006: COLON RESECTION; Left     Comment:  due to diverticular disease at [facility] 2013?: COLONOSCOPY 05/22/2018: COLONOSCOPY WITH PROPOFOL; N/A     Comment:  Procedure: COLONOSCOPY WITH PROPOFOL;  Surgeon: Lin Landsman, MD;  Location: [facility] ENDOSCOPY;  Service:               Gastroenterology;  Laterality: N/A; 10/22/2020: COLONOSCOPY WITH PROPOFOL; N/A     Comment:  Procedure: COLONOSCOPY WITH PROPOFOL;  Surgeon: Lin Landsman, MD;  Location: 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     Comment:  revision     Reproductive/Obstetrics negative OB ROS                             Anesthesia Physical Anesthesia Plan  ASA: 2  Anesthesia Plan: General   Post-op Pain Management:    Induction: Intravenous  PONV Risk Score and Plan: Propofol infusion and TIVA  Airway Management Planned: Natural Airway and Nasal Cannula  Additional Equipment:   Intra-op Plan:   Post-operative Plan:   Informed Consent: I have reviewed the patients History and Physical, chart, labs and discussed the procedure including the risks, benefits and alternatives for the proposed anesthesia with the patient or authorized representative who has indicated his/her  understanding and acceptance.     Dental Advisory Given  Plan Discussed with: Anesthesiologist, CRNA and Surgeon  Anesthesia Plan Comments: (Patient consented for risks of anesthesia including but not limited to:  - adverse reactions to medications - risk of airway placement if required - damage to eyes, teeth, lips or other oral mucosa - nerve damage due to positioning  - sore throat or hoarseness - Damage to heart, brain, nerves, lungs, other parts of body or loss of life  Patient voiced understanding.)        Anesthesia Quick Evaluation

## 2022-11-29 NOTE — Anesthesia Procedure Notes (Signed)
Procedure Name: MAC Date/Time: 11/29/2022 7:55 AM  Performed by: Jerrye Noble, CRNAPre-anesthesia Checklist: Patient identified, Emergency Drugs available, Suction available and Patient being monitored Patient Re-evaluated:Patient Re-evaluated prior to induction Oxygen Delivery Method: Nasal cannula Preoxygenation: Pre-oxygenation with 100% oxygen

## 2022-11-30 ENCOUNTER — Encounter
Admission: RE | Disposition: A | Discharge status: Home / Self Care | Payer: Self-pay | Source: Home / Self Care | Attending: Gastroenterology

## 2022-11-30 ENCOUNTER — Other Ambulatory Visit: Payer: Self-pay

## 2022-11-30 ENCOUNTER — Ambulatory Visit
Admission: RE | Admit: 2022-11-30 | Discharge: 2022-11-30 | Disposition: A | Discharge status: Home / Self Care | Payer: 59 | Attending: Gastroenterology | Admitting: Gastroenterology

## 2022-11-30 ENCOUNTER — Ambulatory Visit: Payer: 59 | Admitting: Anesthesiology

## 2022-11-30 ENCOUNTER — Encounter: Payer: Self-pay | Admitting: Gastroenterology

## 2022-11-30 DIAGNOSIS — Z9884 Bariatric surgery status: Secondary | ICD-10-CM | POA: Diagnosis not present

## 2022-11-30 DIAGNOSIS — Z860100 Personal history of colon polyps, unspecified: Secondary | ICD-10-CM

## 2022-11-30 DIAGNOSIS — J301 Allergic rhinitis due to pollen: Secondary | ICD-10-CM | POA: Insufficient documentation

## 2022-11-30 DIAGNOSIS — K635 Polyp of colon: Secondary | ICD-10-CM | POA: Insufficient documentation

## 2022-11-30 DIAGNOSIS — Z9049 Acquired absence of other specified parts of digestive tract: Secondary | ICD-10-CM | POA: Diagnosis not present

## 2022-11-30 DIAGNOSIS — F17290 Nicotine dependence, other tobacco product, uncomplicated: Secondary | ICD-10-CM | POA: Diagnosis not present

## 2022-11-30 DIAGNOSIS — G4730 Sleep apnea, unspecified: Secondary | ICD-10-CM | POA: Diagnosis not present

## 2022-11-30 DIAGNOSIS — I10 Essential (primary) hypertension: Secondary | ICD-10-CM | POA: Insufficient documentation

## 2022-11-30 DIAGNOSIS — D122 Benign neoplasm of ascending colon: Secondary | ICD-10-CM | POA: Diagnosis not present

## 2022-11-30 DIAGNOSIS — M1990 Unspecified osteoarthritis, unspecified site: Secondary | ICD-10-CM | POA: Diagnosis not present

## 2022-11-30 DIAGNOSIS — D124 Benign neoplasm of descending colon: Secondary | ICD-10-CM | POA: Diagnosis not present

## 2022-11-30 DIAGNOSIS — F419 Anxiety disorder, unspecified: Secondary | ICD-10-CM | POA: Diagnosis not present

## 2022-11-30 DIAGNOSIS — R69 Illness, unspecified: Secondary | ICD-10-CM | POA: Diagnosis not present

## 2022-11-30 DIAGNOSIS — Z09 Encounter for follow-up examination after completed treatment for conditions other than malignant neoplasm: Secondary | ICD-10-CM | POA: Diagnosis not present

## 2022-11-30 HISTORY — PX: COLONOSCOPY: SHX5424

## 2022-11-30 SURGERY — COLONOSCOPY
Anesthesia: General

## 2022-11-30 MED ORDER — PROPOFOL 10 MG/ML IV BOLUS
INTRAVENOUS | Status: DC | PRN
  Administered 2022-11-30: 70 mg via INTRAVENOUS
  Administered 2022-11-30: 30 mg via INTRAVENOUS

## 2022-11-30 MED ORDER — PROPOFOL 500 MG/50ML IV EMUL
INTRAVENOUS | Status: DC | PRN
  Administered 2022-11-30: 175 ug/kg/min via INTRAVENOUS

## 2022-11-30 MED ORDER — SODIUM CHLORIDE 0.9 % IV SOLN: INTRAVENOUS | Status: DC

## 2022-11-30 MED ORDER — LIDOCAINE HCL (CARDIAC) PF 100 MG/5ML IV SOSY
PREFILLED_SYRINGE | INTRAVENOUS | Status: DC | PRN
  Administered 2022-11-30: 100 mg via INTRAVENOUS

## 2022-11-30 NOTE — Op Note (Signed)
[facility] Gastroenterology Patient Name: Harold Smith Procedure Date: 11/30/2022 12:53 PM MRN: [id number] Account #: [id number] Date of Birth: May 15, 1966 Admit Type: Outpatient Age: 56 Room: [facility] ENDO ROOM 4 Gender: Male Note Status: Finalized Instrument Name: Colonscope [id number] Procedure:             Colonoscopy Indications:           High risk colon cancer surveillance: Personal history                         of colonic polyps, Last colonoscopy: December 2023 Providers:             Lin Landsman MD, MD Medicines:             General Anesthesia Complications:         No immediate complications. Estimated blood loss: None. Procedure:             Pre-Anesthesia Assessment:                        - Prior to the procedure, a History and Physical was                         performed, and patient medications and allergies were                         reviewed. The patient is competent. The risks and                         benefits of the procedure and the sedation options and                         risks were discussed with the patient. All questions                         were answered and informed consent was obtained.                         Patient identification and proposed procedure were                         verified by the physician, the nurse, the                         anesthesiologist, the anesthetist and the technician                         in the pre-procedure area in the procedure room in the                         endoscopy suite. Mental Status Examination: alert and                         oriented. Airway Examination: normal oropharyngeal                         airway and neck mobility. Respiratory Examination:  clear to auscultation. CV Examination: normal.                         Prophylactic Antibiotics: The patient does not require                         prophylactic antibiotics. Prior Anticoagulants: The                          patient has taken no anticoagulant or antiplatelet                         agents. ASA Grade Assessment: II - A patient with mild                         systemic disease. After reviewing the risks and                         benefits, the patient was deemed in satisfactory                         condition to undergo the procedure. The anesthesia                         plan was to use general anesthesia. Immediately prior                         to administration of medications, the patient was                         re-assessed for adequacy to receive sedatives. The                         heart rate, respiratory rate, oxygen saturations,                         blood pressure, adequacy of pulmonary ventilation, and                         response to care were monitored throughout the                         procedure. The physical status of the patient was                         re-assessed after the procedure.                        After obtaining informed consent, the colonoscope was                         passed under direct vision. Throughout the procedure,                         the patient's blood pressure, pulse, and oxygen                         saturations were monitored continuously. The  Colonoscope was introduced through the anus and                         advanced to the the cecum, identified by appendiceal                         orifice and ileocecal valve. The colonoscopy was                         unusually difficult due to significant looping.                         Successful completion of the procedure was aided by                         applying abdominal pressure. The patient tolerated the                         procedure well. The quality of the bowel preparation                         was adequate to identify polyps greater than 5 mm in                         size. The ileocecal valve, appendiceal  orifice, and                         rectum were photographed. Findings:      The perianal and digital rectal examinations were normal. Pertinent       negatives include normal sphincter tone and no palpable rectal lesions.      Six sessile polyps were found in the descending colon, transverse colon       and cecum. The polyps were 3 to 6 mm in size. These polyps were removed       with a cold snare. Resection and retrieval were complete. Estimated       blood loss: none.      The retroflexed view of the distal rectum and anal verge was normal and       showed no anal or rectal abnormalities.      A large amount of semi-liquid stool was found in the entire colon,       precluding visualization. Lavage of the area was performed using 50 -       200 mL of sterile water, resulting in clearance with good visualization. Impression:            - Six 3 to 6 mm polyps in the descending colon, in the                         transverse colon and in the cecum, removed with a cold                         snare. Resected and retrieved.                        - The distal rectum and anal verge are normal on  retroflexion view.                        - Stool in the entire examined colon. Recommendation:        - Discharge patient to home (with escort).                        - Resume previous diet today.                        - Continue present medications.                        - Await pathology results.                        - Repeat colonoscopy in 3 years for surveillance of                         multiple polyps with 2 day prep. Procedure Code(s):     --- Professional ---                        [phone], Colonoscopy, flexible; with removal of                         tumor(s), polyp(s), or other lesion(s) by snare                         technique Diagnosis Code(s):     --- Professional ---                        Z86.010, Personal history of colonic polyps                         D12.4, Benign neoplasm of descending colon                        D12.3, Benign neoplasm of transverse colon (hepatic                         flexure or splenic flexure)                        D12.0, Benign neoplasm of cecum CPT copyright 2022 American Medical Association. All rights reserved. The codes documented in this report are preliminary and upon coder review may  be revised to meet current compliance requirements. Dr. Ulyess Mort Lin Landsman MD, MD 11/30/2022 1:27:58 PM This report has been signed electronically. Number of Addenda: 0 Note Initiated On: 11/30/2022 12:53 PM Scope Withdrawal Time: 0 hours 18 minutes 44 seconds  Total Procedure Duration: 0 hours 22 minutes 16 seconds  Estimated Blood Loss:  Estimated blood loss: none.      [facility]

## 2022-11-30 NOTE — H&P (Signed)
Harold Darby, MD [address]  [place]  [place], [place] [address]  Main: [phone]  Fax: [phone] Pager: [phone]  Primary Care Physician:  [name], MD Primary Gastroenterologist:  Dr. Cephas Smith  Pre-Procedure History & Physical: HPI:  Harold Smith is a 56 y.o. male is here for an colonoscopy.   Past Medical History:  Diagnosis Date   Allergic rhinitis due to pollen    Anxiety    Colon polyps    Diverticulitis large intestine    HTN (hypertension)    Metabolic bone disease 0/12/7791   Obesity    Osteoarthritis, knee    Sleep apnea    does not use cpap   Sleep disturbance    Vitamin D deficiency 09/01/2022    Past Surgical History:  Procedure Laterality Date   ACHILLES TENDON REPAIR Right 12/26/2006   CATARACT EXTRACTION W/PHACO Left 07/20/2021   Procedure: CATARACT EXTRACTION PHACO AND INTRAOCULAR LENS PLACEMENT ([place]) LEFT 2.11 00:24.0;  Surgeon: Birder Robson, MD;  Location: [place];  Service: Ophthalmology;  Laterality: Left;  sleep apnea   CATARACT EXTRACTION W/PHACO Right 08/03/2021   Procedure: CATARACT EXTRACTION PHACO AND INTRAOCULAR LENS PLACEMENT (IOC) RIGHT;  Surgeon: Birder Robson, MD;  Location: [place];  Service: Ophthalmology;  Laterality: Right;  3.19 0:33.0   CHOLECYSTECTOMY     COLON RESECTION Left 12/26/2004   due to diverticular disease at [place]  2013?   COLONOSCOPY WITH PROPOFOL N/A 05/22/2018   Procedure: COLONOSCOPY WITH PROPOFOL;  Surgeon: Lin Landsman, MD;  Location: [facility] ENDOSCOPY;  Service: Gastroenterology;  Laterality: N/A;   COLONOSCOPY WITH PROPOFOL N/A 10/22/2020   Procedure: COLONOSCOPY WITH PROPOFOL;  Surgeon: Lin Landsman, MD;  Location: [place];  Service: Endoscopy;  Laterality: N/A;  priority 4   COLONOSCOPY WITH PROPOFOL N/A 11/10/2021   Procedure: COLONOSCOPY WITH PROPOFOL;  Surgeon: Lin Landsman,  MD;  Location: [facility] ENDOSCOPY;  Service: Gastroenterology;  Laterality: N/A;   COLONOSCOPY WITH PROPOFOL N/A 06/16/2022   Procedure: COLONOSCOPY WITH PROPOFOL;  Surgeon: Lin Landsman, MD;  Location: [place];  Service: Endoscopy;  Laterality: N/A;   DRUG INDUCED ENDOSCOPY N/A 06/10/2019   Procedure: DRUG INDUCED SLEEP ENDOSCOPY;  Surgeon: Jerrell Belfast, MD;  Location: [place];  Service: ENT;  Laterality: N/A;   ESOPHAGOGASTRODUODENOSCOPY (EGD) WITH PROPOFOL N/A 06/16/2022   Procedure: ESOPHAGOGASTRODUODENOSCOPY (EGD) WITH PROPOFOL;  Surgeon: Lin Landsman, MD;  Location: [place];  Service: Endoscopy;  Laterality: N/A;   EYE SURGERY     GASTRIC BYPASS N/A 12/27/2007   HIATAL HERNIA REPAIR  09/25/2014   at [place] Right 08/30/2022   Procedure: OPEN REDUCTION INTERNAL FIXATION (ORIF) DISTAL FEMUR FRACTURE;  Surgeon: Altamese Fairchild, MD;  Location: [place];  Service: Orthopedics;  Laterality: Right;   POLYPECTOMY  10/22/2020   Procedure: POLYPECTOMY;  Surgeon: Lin Landsman, MD;  Location: [place];  Service: Endoscopy;;   POLYPECTOMY  06/16/2022   Procedure: POLYPECTOMY;  Surgeon: Lin Landsman, MD;  Location: [place];  Service: Endoscopy;;   ROTATOR CUFF REPAIR     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 Authorizing Provider  cetirizine (ZYRTEC) 10 MG tablet Take 10 mg by mouth daily.   Yes [provider]  Cholecalciferol 125  MCG (5000 UT) TABS Take 1 tablet by mouth daily. 09/01/22  Yes Ainsley Spinner, PA-C  Cyanocobalamin (VITAMIN B-12 PO) Take 1 tablet by mouth daily.   Yes [provider]  docusate sodium (COLACE) 100 MG capsule Take 1 capsule (100 mg total) by mouth 2 (two) times daily. 09/01/22  Yes Ainsley Spinner, PA-C  Multiple Vitamin (MULTIVITAMIN) tablet Take 1 tablet by mouth  daily.   Yes [provider]  Sodium Fluoride (PREVIDENT 5000 BOOSTER PLUS DT) Place 1 application  onto teeth 2 (two) times daily.   Yes [provider]  traZODone (DESYREL) 50 MG tablet Take 50 mg by mouth at bedtime.   Yes [provider]  Zinc Sulfate 220 (50 Zn) MG TABS Take 1 tablet (220 mg total) by mouth daily. 09/02/22  Yes Ainsley Spinner, PA-C  zolpidem (AMBIEN CR) 12.5 MG CR tablet Take 1 tablet (12.5 mg total) by mouth at bedtime as needed for sleep. 09/02/22  Yes [name], MD  cyclobenzaprine (FLEXERIL) 10 MG tablet Take 1 tablet (10 mg total) by mouth at bedtime as needed for muscle spasms. 09/19/22   [name], MD  dicyclomine (BENTYL) 10 MG capsule Take 1 to 2 capsules as needed for abdominal cramps and diarrhea every 6 to 8 hours 10/12/22   Lin Landsman, MD  DULoxetine (CYMBALTA) 60 MG capsule Take 60 mg by mouth daily. 10/03/22   [provider]  ergocalciferol (VITAMIN D2) 1.25 MG (50000 UT) capsule Take 1 capsule (50,000 Units total) by mouth every Monday. 09/05/22   Ainsley Spinner, PA-C  FEROSUL 325 (65 Fe) MG tablet TAKE 1 TABLET BY MOUTH DAILY WITH BREAKFAST Patient taking differently: Take 325 mg by mouth daily with breakfast. 06/29/22   [name], MD  fluticasone (FLONASE) 50 MCG/ACT nasal spray Place 2 sprays into both nostrils daily. 07/11/22   [name], MD  gabapentin (NEURONTIN) 300 MG capsule Take 1 capsule twice daily and 4 caps at bedtime 09/07/22   [name], MD  hydrochlorothiazide (HYDRODIURIL) 25 MG tablet Take 1 tablet (25 mg total) by mouth daily. 09/29/22   [name], MD  HYDROcodone-acetaminophen (NORCO/VICODIN) 5-325 MG tablet Take 1-2 tablets by mouth 2 (two) times daily as needed for moderate pain. 10/14/22   [name], MD  hydrocortisone (ANUSOL-HC) 25 MG suppository Place 1 suppository (25 mg total) rectally daily. 10/12/22   Lin Landsman, MD  ipratropium (ATROVENT)  0.06 % nasal spray Place 2 sprays into both nostrils daily.    [provider]  lipase/protease/amylase (CREON) 36000 UNITS CPEP capsule Take 2 capsules (72,000 Units total) by mouth 3 (three) times daily with meals. May also take 1 capsule (36,000 Units total) as needed (with snacks). 07/15/22   Lin Landsman, MD  lisinopril (ZESTRIL) 20 MG tablet Take 1 tablet (20 mg total) by mouth daily. 09/29/22   [name], MD  meloxicam (MOBIC) 15 MG tablet Take 1 tablet (15 mg total) by mouth daily as needed for pain. 10/09/22   Ainsley Spinner, PA-C  methocarbamol (ROBAXIN) 500 MG tablet Take 1 tablet (500 mg total) by mouth every 6 (six) hours as needed for muscle spasms. 09/19/22   [name], MD  omeprazole (PRILOSEC) 40 MG capsule TAKE 1 CAPSULE BY MOUTH TWO TIMES DAILY BEFORE A MEAL 10/03/22   Burtis Imhoff, Tally Due, MD  QUEtiapine (SEROQUEL) 25 MG tablet Take 3 tablets (75 mg total) by mouth at bedtime. 09/19/22  [name], MD  Rivaroxaban (XARELTO) 15 MG TABS tablet Take 1 tablet (15 mg total) by mouth daily with supper. 09/01/22 10/12/22  Ainsley Spinner, PA-C  sildenafil (REVATIO) 20 MG tablet Take 3-5 tablets as needed 1 hour prior to intercourse 08/16/22   Hollice Espy, MD    Allergies as of 11/29/2022   (No Known Allergies)    Family History  Problem Relation Age of Onset   Hypertension Mother    Diabetes Mother    Kidney disease Mother    Heart disease Mother    Colon cancer Father 27   Asthma Sister    Obesity Sister    Stroke Sister    Obesity Brother    Diabetes Other    Heart disease Other    Hypertension Other    Kidney disease Other    Cancer Paternal Uncle        unk type, possible prostate   Lung cancer Paternal Grandmother     Social History   Socioeconomic History   Marital status: Single    Spouse name: Not on file   Number of children: 3   Years of education: Not on file   Highest education level: Not on file  Occupational History    Occupation: Production designer, theatre/television/film  Tobacco Use   Smoking status: Some Days    Types: Cigars    Passive exposure: Past   Smokeless tobacco: Never   Tobacco comments:    occasional cigar  Vaping Use   Vaping Use: Never used  Substance and Sexual Activity   Alcohol use: Yes    Alcohol/week: 0.0 standard drinks of alcohol    Comment: occasional   Drug use: No   Sexual activity: Yes  Other Topics Concern   Not on file  Social History Narrative   Lives with 2 daughters from 1st marriage   Shared custody of another daughter from 2nd marriage   Social Determinants of Health   Financial Resource Strain: Not on file  Food Insecurity: Not on file  Transportation Needs: Not on file  Physical Activity: Not on file  Stress: Not on file  Social Connections: Not on file  Intimate Partner Violence: Not on file    Review of Systems: See HPI, otherwise negative ROS  Physical Exam: BP (!) 148/98   Pulse 82   Temp (!) 96.7 F (35.9 C) (Temporal)   Resp 20   Ht '5\' 10"'$  (6.384 m)   Wt 81.6 kg   SpO2 99%   BMI 25.80 kg/m  General:   Alert,  pleasant and cooperative in NAD Head:  Normocephalic and atraumatic. Neck:  Supple; no masses or thyromegaly. Lungs:  Clear throughout to auscultation.    Heart:  Regular rate and rhythm. Abdomen:  Soft, nontender and nondistended. Normal bowel sounds, without guarding, and without rebound.   Neurologic:  Alert and  oriented x4;  grossly normal neurologically.  Impression/Plan: Harold Smith is here for an colonoscopy to be performed for h/o colon adenoma  Risks, benefits, limitations, and alternatives regarding  colonoscopy have been reviewed with the patient.  Questions have been answered.  All parties agreeable.   [name], MD  11/30/2022, 11:29 AM

## 2022-11-30 NOTE — Anesthesia Preprocedure Evaluation (Signed)
Anesthesia Evaluation  Patient identified by MRN, date of birth, ID band Patient awake    Reviewed: Allergy & Precautions, NPO status , Patient's Chart, lab work & pertinent test results  History of Anesthesia Complications Negative for: history of anesthetic complications  Airway Mallampati: III  TM Distance: >3 FB Neck ROM: full    Dental  (+) Dental Advidsory Given, Teeth Intact, Missing, Poor Dentition   Pulmonary neg shortness of breath, sleep apnea , neg COPD, neg recent URI, Current Smoker   Pulmonary exam normal        Cardiovascular hypertension, (-) angina (-) Past MI and (-) Cardiac Stents negative cardio ROS Normal cardiovascular exam(-) dysrhythmias (-) Valvular Problems/Murmurs     Neuro/Psych  PSYCHIATRIC DISORDERS Anxiety     negative neurological ROS     GI/Hepatic Neg liver ROS, PUD,,,  Endo/Other  negative endocrine ROS    Renal/GU negative Renal ROS  negative genitourinary   Musculoskeletal  (+) Arthritis ,    Abdominal Normal abdominal exam  (+)   Peds  Hematology negative hematology ROS (+)   Anesthesia Other Findings Past Medical History: No date: Allergic rhinitis due to pollen No date: Colon polyps No date: Diverticulitis large intestine No date: Obesity No date: Osteoarthritis, knee No date: Sleep apnea     Comment:  does not use cpap No date: Sleep disturbance  Past Surgical History: 2008: ACHILLES TENDON REPAIR; Right 07/20/2021: CATARACT EXTRACTION W/PHACO; Left     Comment:  Procedure: CATARACT EXTRACTION PHACO AND INTRAOCULAR               LENS PLACEMENT (IOC) LEFT 2.11 00:24.0;  Surgeon:               Birder Robson, MD;  Location: [place];                Service: Ophthalmology;  Laterality: Left;  sleep apnea 08/03/2021: CATARACT EXTRACTION W/PHACO; Right     Comment:  Procedure: CATARACT EXTRACTION PHACO AND INTRAOCULAR               LENS PLACEMENT (IOC)  RIGHT;  Surgeon: Birder Robson,               MD;  Location: [place];  Service:               Ophthalmology;  Laterality: Right;  3.19 0:33.0 2006: COLON RESECTION; Left     Comment:  due to diverticular disease at [facility] 2013?: COLONOSCOPY 05/22/2018: COLONOSCOPY WITH PROPOFOL; N/A     Comment:  Procedure: COLONOSCOPY WITH PROPOFOL;  Surgeon: Lin Landsman, MD;  Location: [facility] ENDOSCOPY;  Service:               Gastroenterology;  Laterality: N/A; 10/22/2020: COLONOSCOPY WITH PROPOFOL; N/A     Comment:  Procedure: COLONOSCOPY WITH PROPOFOL;  Surgeon: Lin Landsman, MD;  Location: [place];                Service: Endoscopy;  Laterality: N/A;  priority 4 06/10/2019: DRUG INDUCED ENDOSCOPY; N/A     Comment:  Procedure: DRUG INDUCED SLEEP ENDOSCOPY;  Surgeon:               Jerrell Belfast, MD;  Location: [facility];  Service: ENT;  Laterality: N/A; 2009: GASTRIC BYPASS; N/A 10/15: HIATAL HERNIA REPAIR     Comment:  at [place] 10/22/2020: POLYPECTOMY     Comment:  Procedure: POLYPECTOMY;  Surgeon: Lin Landsman,               MD;  Location: [place];  Service: Endoscopy;; 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     Comment:  revision     Reproductive/Obstetrics negative OB ROS                              Anesthesia Physical Anesthesia Plan  ASA: 2  Anesthesia Plan: General   Post-op Pain Management:    Induction: Intravenous  PONV Risk Score and Plan: Propofol infusion and TIVA  Airway Management Planned: Natural Airway and Nasal Cannula  Additional Equipment:   Intra-op Plan:   Post-operative Plan:   Informed Consent: I have reviewed the patients History and Physical, chart, labs and discussed the procedure including the risks, benefits and alternatives for the proposed anesthesia with the patient or authorized representative who has  indicated his/her understanding and acceptance.     Dental Advisory Given  Plan Discussed with: Anesthesiologist, CRNA and Surgeon  Anesthesia Plan Comments: (Patient consented for risks of anesthesia including but not limited to:  - adverse reactions to medications - risk of airway placement if required - damage to eyes, teeth, lips or other oral mucosa - nerve damage due to positioning  - sore throat or hoarseness - Damage to heart, brain, nerves, lungs, other parts of body or loss of life  Patient voiced understanding.)         Anesthesia Quick Evaluation

## 2022-11-30 NOTE — Transfer of Care (Signed)
Immediate Anesthesia Transfer of Care Note  Patient: Harold Smith  Procedure(s) Performed: COLONOSCOPY  Patient Location: Endoscopy Unit  Anesthesia Type:General  Level of Consciousness: awake, drowsy, and patient cooperative  Airway & Oxygen Therapy: Patient Spontanous Breathing and Patient connected to face mask oxygen  Post-op Assessment: Report given to RN and Post -op Vital signs reviewed and stable  Post vital signs: Reviewed and stable  Last Vitals:  Vitals Value Taken Time  BP 88/68 11/30/22 1329  Temp    Pulse 71 11/30/22 1330  Resp 14 11/30/22 1330  SpO2 100 % 11/30/22 1330  Vitals shown include unvalidated device data.  Last Pain:  Vitals:   11/30/22 1114  TempSrc: Temporal  PainSc: 0-No pain         Complications: No notable events documented.

## 2022-11-30 NOTE — Anesthesia Procedure Notes (Signed)
Procedure Name: General with mask airway Date/Time: 11/30/2022 1:00 PM  Performed by: Kelton Pillar, CRNAPre-anesthesia Checklist: Emergency Drugs available, Patient identified, Suction available and Patient being monitored Patient Re-evaluated:Patient Re-evaluated prior to induction Oxygen Delivery Method: Simple face mask Induction Type: IV induction Placement Confirmation: positive ETCO2, CO2 detector and breath sounds checked- equal and bilateral Dental Injury: Teeth and Oropharynx as per pre-operative assessment

## 2022-11-30 NOTE — Anesthesia Postprocedure Evaluation (Signed)
Anesthesia Post Note  Patient: Harold Smith  Procedure(s) Performed: COLONOSCOPY  Patient location during evaluation: Endoscopy Anesthesia Type: General Level of consciousness: awake and alert Pain management: pain level controlled Vital Signs Assessment: post-procedure vital signs reviewed and stable Respiratory status: spontaneous breathing, nonlabored ventilation, respiratory function stable and patient connected to nasal cannula oxygen Cardiovascular status: blood pressure returned to baseline and stable Postop Assessment: no apparent nausea or vomiting Anesthetic complications: no  No notable events documented.   Last Vitals:  Vitals:   11/30/22 1114 11/30/22 1329  BP: (!) 148/98 (!) 88/68  Pulse: 82 73  Resp: 20 (!) 27  Temp: (!) 35.9 C (!) 36.2 C  SpO2: 99% 100%    Last Pain:  Vitals:   11/30/22 1329  TempSrc: Temporal  PainSc: Asleep                 Ilene Qua

## 2022-11-30 NOTE — Addendum Note (Signed)
Addendum  created 11/30/22 1423 by Kelton Pillar, CRNA   Child order released for a procedure order, Clinical Note Signed, Intraprocedure Blocks edited, SmartForm saved

## 2022-12-01 ENCOUNTER — Encounter: Payer: Self-pay | Admitting: Gastroenterology

## 2022-12-01 ENCOUNTER — Encounter: Payer: Self-pay | Admitting: Radiology

## 2022-12-01 ENCOUNTER — Telehealth: Payer: Self-pay | Admitting: Orthopedic Surgery

## 2022-12-01 LAB — SURGICAL PATHOLOGY

## 2022-12-01 NOTE — Telephone Encounter (Signed)
Note written and pt will pick it up at the front desk

## 2022-12-01 NOTE — Anesthesia Postprocedure Evaluation (Signed)
Anesthesia Post Note  Patient: Cadence Haslam  Procedure(s) Performed: COLONOSCOPY WITH PROPOFOL  Patient location during evaluation: Endoscopy Anesthesia Type: General Level of consciousness: awake and alert Pain management: pain level controlled Vital Signs Assessment: post-procedure vital signs reviewed and stable Respiratory status: spontaneous breathing, nonlabored ventilation, respiratory function stable and patient connected to nasal cannula oxygen Cardiovascular status: blood pressure returned to baseline and stable Postop Assessment: no apparent nausea or vomiting Anesthetic complications: no   No notable events documented.   Last Vitals:  Vitals:   11/29/22 0814 11/29/22 0817  BP:  (!) 126/98  Pulse: 75 81  Resp: 14 20  Temp:    SpO2: 98% 99%    Last Pain:  Vitals:   11/29/22 0817  TempSrc:   PainSc: 0-No pain                 Martha Clan
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
## 2022-12-01 NOTE — Telephone Encounter (Signed)
Please advise 

## 2022-12-02 ENCOUNTER — Encounter: Payer: Self-pay | Admitting: Gastroenterology

## 2022-12-02 ENCOUNTER — Other Ambulatory Visit: Payer: Self-pay

## 2022-12-03 ENCOUNTER — Other Ambulatory Visit (HOSPITAL_COMMUNITY): Payer: Self-pay

## 2022-12-05 ENCOUNTER — Inpatient Hospital Stay: Payer: 59 | Attending: Oncology

## 2022-12-05 DIAGNOSIS — D509 Iron deficiency anemia, unspecified: Secondary | ICD-10-CM | POA: Insufficient documentation

## 2022-12-05 DIAGNOSIS — D508 Other iron deficiency anemias: Secondary | ICD-10-CM

## 2022-12-05 LAB — CBC WITH DIFFERENTIAL/PLATELET
Abs Immature Granulocytes: 0.06 10*3/uL (ref 0.00–0.07)
Basophils Absolute: 0 10*3/uL (ref 0.0–0.1)
Basophils Relative: 1 %
Eosinophils Absolute: 0.1 10*3/uL (ref 0.0–0.5)
Eosinophils Relative: 2 %
HCT: 42.1 % (ref 39.0–52.0)
Hemoglobin: 14 g/dL (ref 13.0–17.0)
Immature Granulocytes: 1 %
Lymphocytes Relative: 23 %
Lymphs Abs: 1.5 10*3/uL (ref 0.7–4.0)
MCH: 32.8 pg (ref 26.0–34.0)
MCHC: 33.3 g/dL (ref 30.0–36.0)
MCV: 98.6 fL (ref 80.0–100.0)
Monocytes Absolute: 0.6 10*3/uL (ref 0.1–1.0)
Monocytes Relative: 8 %
Neutro Abs: 4.2 10*3/uL (ref 1.7–7.7)
Neutrophils Relative %: 65 %
Platelets: 431 10*3/uL — ABNORMAL HIGH (ref 150–400)
RBC: 4.27 MIL/uL (ref 4.22–5.81)
RDW: 15.7 % — ABNORMAL HIGH (ref 11.5–15.5)
WBC: 6.5 10*3/uL (ref 4.0–10.5)
nRBC: 0 % (ref 0.0–0.2)

## 2022-12-05 LAB — IRON AND TIBC
Iron: 69 ug/dL (ref 45–182)
Saturation Ratios: 17 % — ABNORMAL LOW (ref 17.9–39.5)
TIBC: 417 ug/dL (ref 250–450)
UIBC: 348 ug/dL

## 2022-12-05 LAB — FERRITIN: Ferritin: 35 ng/mL (ref 24–336)

## 2022-12-06 ENCOUNTER — Encounter: Payer: Self-pay | Admitting: Vascular Surgery

## 2022-12-06 ENCOUNTER — Ambulatory Visit: Payer: 59 | Admitting: Vascular Surgery

## 2022-12-06 VITALS — BP 127/84 | HR 83 | Temp 97.7°F | Resp 18 | Ht 72.0 in | Wt 174.0 lb

## 2022-12-06 DIAGNOSIS — M5136 Other intervertebral disc degeneration, lumbar region: Secondary | ICD-10-CM
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
## 2022-12-08 ENCOUNTER — Ambulatory Visit: Payer: 59

## 2022-12-08 ENCOUNTER — Ambulatory Visit
Admission: RE | Admit: 2022-12-08 | Discharge: 2022-12-08 | Disposition: A | Discharge status: Home / Self Care | Payer: 59 | Source: Ambulatory Visit | Attending: Orthopedic Surgery | Admitting: Orthopedic Surgery

## 2022-12-08 ENCOUNTER — Ambulatory Visit: Payer: 59 | Admitting: Oncology

## 2022-12-08 ENCOUNTER — Other Ambulatory Visit: Payer: 59

## 2022-12-08 ENCOUNTER — Other Ambulatory Visit (HOSPITAL_COMMUNITY): Payer: Self-pay

## 2022-12-08 DIAGNOSIS — M4712 Other spondylosis with myelopathy, cervical region: Secondary | ICD-10-CM | POA: Diagnosis not present

## 2022-12-08 DIAGNOSIS — R200 Anesthesia of skin: Secondary | ICD-10-CM | POA: Diagnosis not present

## 2022-12-08 DIAGNOSIS — M542 Cervicalgia: Secondary | ICD-10-CM | POA: Diagnosis not present

## 2022-12-08 NOTE — Pre-Procedure Instructions (Signed)
Surgical Instructions    Your procedure is scheduled on Friday, December 22.  Report to [facility] Main Entrance "A" at 5:30 A.M., then check in with the Admitting office.  Call this number if you have problems the morning of surgery:  [phone]   If you have any questions prior to your surgery date call [phone]: Open Monday-Friday 8am-4pm If you experience any cold or flu symptoms such as cough, fever, chills, shortness of breath, etc. between now and your scheduled surgery, please notify us at the above number     Remember:  Do not eat after midnight the night before your surgery  You may drink clear liquids until 4:30AM the morning of your surgery.   Clear liquids allowed are: Water, Non-Citrus Juices (without pulp), Carbonated Beverages, Clear Tea, Black Coffee ONLY (NO MILK, CREAM OR POWDERED CREAMER of any kind), and Gatorade    Take these medicines the morning of surgery with A SIP OF WATER:  cetirizine (ZYRTEC)  DULoxetine (CYMBALTA)  fluticasone (FLONASE) 50 MCG/ACT nasal spray  ipratropium (ATROVENT) 0.06 % nasal spray  omeprazole (PRILOSEC)   cyclobenzaprine (FLEXERIL)  if needed dicyclomine (BENTYL)  if needed HYDROcodone-acetaminophen (NORCO/VICODIN  if needed methocarbamol (ROBAXIN)  if needed  As of today, STOP taking any Aspirin (unless otherwise instructed by your surgeon), meloxicam (MOBIC), Aleve, Naproxen, Ibuprofen, Motrin, Advil, Goody's, BC's, all herbal medications, fish oil, and all vitamins.             [facility] is not responsible for any belongings or valuables.    Do NOT Smoke (Tobacco/Vaping)  24 hours prior to your procedure  If you use a CPAP at night, you may bring your mask for your overnight stay.   Contacts, glasses, hearing aids, dentures or partials may not be worn into surgery, please bring cases for these belongings   For patients admitted to the hospital, discharge time will be determined by your treatment team.    Patients discharged the day of surgery will not be allowed to drive home, and someone needs to stay with them for 24 hours.   SURGICAL WAITING ROOM VISITATION Patients having surgery or a procedure may have no more than 2 support people in the waiting area - these visitors may rotate.   Children under the age of 41 must have an adult with them who is not the patient. If the patient needs to stay at the hospital during part of their recovery, the visitor guidelines for inpatient rooms apply. Pre-op nurse will coordinate an appropriate time for 1 support person to accompany patient in pre-op.  This support person may not rotate.   Please refer to [email or web address] for the visitor guidelines for Inpatients (after your surgery is over and you are in a regular room).    Special instructions:    Oral Hygiene is also important to reduce your risk of infection.  Remember - BRUSH YOUR TEETH THE MORNING OF SURGERY WITH YOUR REGULAR TOOTHPASTE   [facility]- Preparing For Surgery  Before surgery, you can play an important role. Because skin is not sterile, your skin needs to be as free of germs as possible. You can reduce the number of germs on your skin by washing with CHG (chlorahexidine gluconate) Soap before surgery.  CHG is an antiseptic cleaner which kills germs and bonds with the skin to continue killing germs even after washing.     Please do not use if you have an allergy to CHG or antibacterial soaps. If your skin  becomes reddened/irritated stop using the CHG.  Do not shave (including legs and underarms) for at least 48 hours prior to first CHG shower. It is OK to shave your face.  Please follow these instructions carefully.     Shower the NIGHT BEFORE SURGERY and the MORNING OF SURGERY with CHG Soap.   If you chose to wash your hair, wash your hair first as usual with your normal shampoo. After you shampoo, rinse your hair and body  thoroughly to remove the shampoo.  Then [employer] and genitals (private parts) with your normal soap and rinse thoroughly to remove soap.  After that Use CHG Soap as you would any other liquid soap. You can apply CHG directly to the skin and wash gently with a scrungie or a clean washcloth.   Apply the CHG Soap to your body ONLY FROM THE NECK DOWN.  Do not use on open wounds or open sores. Avoid contact with your eyes, ears, mouth and genitals (private parts). Wash Face and genitals (private parts)  with your normal soap.   Wash thoroughly, paying special attention to the area where your surgery will be performed.  Thoroughly rinse your body with warm water from the neck down.  DO NOT shower/wash with your normal soap after using and rinsing off the CHG Soap.  Pat yourself dry with a CLEAN TOWEL.  Wear CLEAN PAJAMAS to bed the night before surgery  Place CLEAN SHEETS on your bed the night before your surgery  DO NOT SLEEP WITH PETS.   Day of Surgery:  Take a shower with CHG soap. Wear Clean/Comfortable clothing the morning of surgery Do not wear jewelry or makeup. Do not wear lotions, powders, perfumes/cologne or deodorant. Do not shave 48 hours prior to surgery.  Men may shave face and neck. Do not bring valuables to the hospital. Do not wear nail polish, gel polish, artificial nails, or any other type of covering on natural nails (fingers and toes) If you have artificial nails or gel coating that need to be removed by a nail salon, please have this removed prior to surgery. Artificial nails or gel coating may interfere with anesthesia's ability to adequately monitor your vital signs. Remember to brush your teeth WITH YOUR REGULAR TOOTHPASTE.    If you received a COVID test during your pre-op visit, it is requested that you wear a mask when out in public, stay away from anyone that may not be feeling well, and notify your surgeon if you develop symptoms. If you have been in  contact with anyone that has tested positive in the last 10 days, please notify your surgeon.    Please read over the following fact sheets that you were given.  pre

## 2022-12-09 ENCOUNTER — Inpatient Hospital Stay (HOSPITAL_COMMUNITY)
Admission: RE | Admit: 2022-12-09 | Discharge: 2022-12-09 | Disposition: A | Discharge status: Home / Self Care | Payer: 59 | Source: Ambulatory Visit

## 2022-12-12 ENCOUNTER — Other Ambulatory Visit: Payer: Self-pay | Admitting: Internal Medicine

## 2022-12-12 ENCOUNTER — Encounter: Payer: Self-pay | Admitting: Orthopedic Surgery

## 2022-12-12 DIAGNOSIS — Z008 Encounter for other general examination: Secondary | ICD-10-CM | POA: Diagnosis not present

## 2022-12-13 ENCOUNTER — Inpatient Hospital Stay: Payer: 59

## 2022-12-13 ENCOUNTER — Encounter: Payer: Self-pay | Admitting: Oncology

## 2022-12-13 ENCOUNTER — Inpatient Hospital Stay (HOSPITAL_BASED_OUTPATIENT_CLINIC_OR_DEPARTMENT_OTHER): Payer: 59 | Admitting: Oncology

## 2022-12-13 VITALS — BP 127/96 | HR 92 | Temp 97.8°F | Wt 175.1 lb

## 2022-12-13 VITALS — BP 121/87 | HR 79

## 2022-12-13 DIAGNOSIS — D508 Other iron deficiency anemias: Secondary | ICD-10-CM | POA: Diagnosis not present

## 2022-12-13 DIAGNOSIS — D509 Iron deficiency anemia, unspecified: Secondary | ICD-10-CM | POA: Diagnosis not present

## 2022-12-13 MED ORDER — SODIUM CHLORIDE 0.9 % IV SOLN
200.0000 mg | Freq: Once | INTRAVENOUS | Status: AC
  Administered 2022-12-13: 200 mg via INTRAVENOUS
  Filled 2022-12-13: qty 200

## 2022-12-13 NOTE — Patient Instructions (Signed)

## 2022-12-14 ENCOUNTER — Encounter: Payer: Self-pay | Admitting: Oncology

## 2022-12-14 ENCOUNTER — Encounter: Payer: Self-pay | Admitting: Internal Medicine

## 2022-12-14 ENCOUNTER — Other Ambulatory Visit: Payer: Self-pay | Admitting: Internal Medicine

## 2022-12-14 NOTE — Assessment & Plan Note (Addendum)
#  Iron deficiency anemia, previously tolerated IV Venofer treatments. Labs reviewed and discussed with patient Hemoglobin is 14, iron saturation 17- borderline, ferritin has improved Recommend Venofer '200mg'$  x 1
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
## 2022-12-14 NOTE — Telephone Encounter (Signed)
ZOLPIDEM ER 12.5 IS UNAVAILABLE--CONSIDER CHANGING TO ZOLPIDEM '10MG'$ ? IF APPROPRIATE, PLEASE SEND UPDATED ORDER

## 2022-12-26 DIAGNOSIS — Z419 Encounter for procedure for purposes other than remedying health state, unspecified: Secondary | ICD-10-CM | POA: Diagnosis not present

## 2022-12-30 ENCOUNTER — Telehealth: Payer: Self-pay | Admitting: Orthopedic Surgery

## 2022-12-30 ENCOUNTER — Encounter: Payer: 59 | Admitting: Orthopedic Surgery

## 2022-12-30 DIAGNOSIS — M810 Age-related osteoporosis without current pathological fracture: Secondary | ICD-10-CM

## 2022-12-30 DIAGNOSIS — R2689 Other abnormalities of gait and mobility: Secondary | ICD-10-CM

## 2022-12-30 MED ORDER — ALENDRONATE SODIUM 70 MG PO TABS: 70.0000 mg | ORAL_TABLET | ORAL | 0 refills | Status: DC

## 2022-12-30 NOTE — Telephone Encounter (Signed)
Orthopedic Telephone Call  Spoke to patient this afternoon on the phone. Explained that I will be discussing surgery with his insurance company. Right now, they have me listed as an out-of-network provider but I was told that that should not be the case as I am a [employer] employee which is in network. I informed him that his bone scan showed osteoporosis (T score -3.1) so I would start treatment for him with a bisphosphonate (Ca, PTH were normal). Low Vit D is already getting treated. This was prescribed today until he can get in with endocrinology. I also am referring him to neurology since his MRI of his cervical spine did not show significant central stenosis or any T2 cord signal change so his imbalance may be due to a non-compressive myelopathy or another etiology. I explained that I will continue to try to help him with his low back and bilateral leg pian and, if need be, will refer him to another surgeon who is in network for him since he has tried conservative treatments without any lasting relief. He expressed understanding of the plan and all his questions were answered to his satisfaction.   Callie Fielding, MD [facility]

## 2023-01-02 ENCOUNTER — Other Ambulatory Visit: Payer: Self-pay | Admitting: Gastroenterology

## 2023-01-02 ENCOUNTER — Other Ambulatory Visit: Payer: Self-pay | Admitting: Family

## 2023-01-04 ENCOUNTER — Telehealth: Payer: Self-pay

## 2023-01-04 ENCOUNTER — Encounter: Payer: Self-pay | Admitting: Oncology

## 2023-01-04 ENCOUNTER — Other Ambulatory Visit (HOSPITAL_COMMUNITY): Payer: Self-pay

## 2023-01-04 NOTE — Telephone Encounter (Signed)
Pharmacy Patient Advocate Encounter   Received notification from [facility] that prior authorization for Hydrocodone/APAP 5/'325mg'$  is required/requested.  Per Test Claim: prior auth needed   PA submitted on 01/04/23 to (ins) [employer] via CoverMyMeds Key [id number]   Prior Authorization  has been approved.    PA# PA Case ID: [id number]  Placed a call to the pharmacy to notify of the approval.

## 2023-01-26 DIAGNOSIS — Z419 Encounter for procedure for purposes other than remedying health state, unspecified: Secondary | ICD-10-CM | POA: Diagnosis not present

## 2023-01-31 ENCOUNTER — Other Ambulatory Visit: Payer: Self-pay

## 2023-02-02 ENCOUNTER — Encounter: Payer: Self-pay | Admitting: Oncology

## 2023-02-03 NOTE — Progress Notes (Signed)
Surgical Instructions    Your procedure is scheduled on Monday, February BN:[id number].  Report to [facility] Main Entrance "A" at 5:30 A.M., then check in with the Admitting office.  Call this number if you have problems the morning of surgery:  [phone]   If you have any questions prior to your surgery date call [phone]: Open Monday-Friday 8am-4pm If you experience any cold or flu symptoms such as cough, fever, chills, shortness of breath, etc. between now and your scheduled surgery, please notify us at the above number     Remember:  Do not eat after midnight the night before your surgery  You may drink clear liquids until 4:30 the morning of your surgery.   Clear liquids allowed are: Water, Non-Citrus Juices (without pulp), Carbonated Beverages, Clear Tea, Black Coffee ONLY (NO MILK, CREAM OR POWDERED CREAMER of any kind), and Gatorade   Take these medicines the morning of surgery with A SIP OF WATER:  cetirizine (ZYRTEC)  DULoxetine (CYMBALTA)  fluticasone (FLONASE)  gabapentin (NEURONTIN)  ipratropium (ATROVENT)  lipase/protease/amylase (CREON)  omeprazole (PRILOSEC)    As needed: cyclobenzaprine (FLEXERIL)  dicyclomine (BENTYL)  HYDROcodone-acetaminophen (NORCO/VICODIN  meloxicam (MOBIC)  methocarbamol (ROBAXIN)    As of today, STOP taking any Aspirin (unless otherwise instructed by your surgeon) Aleve, Naproxen, Ibuprofen, Motrin, Advil, Goody's, BC's, all herbal medications, fish oil, and all vitamins.    Patient Instructions  The night before surgery:  No food after midnight. ONLY clear liquids after midnight  The day of surgery (if you do NOT have diabetes):  Drink ONE (1) Pre-Surgery Clear Ensure by 4:30 the morning of surgery. Drink in one sitting. Do not sip.  This drink was given to you during your hospital  pre-op appointment visit.  Nothing else to drink after completing the  Pre-Surgery Clear Ensure.          If you have questions, please  contact your surgeon's office.            Do not wear jewelry or makeup. Do not wear lotions, powders, cologne or deodorant.  Men may shave face and neck. Do not bring valuables to the hospital. Do not wear nail polish, gel polish, artificial nails, or any other type of covering on natural nails (fingers and toes) If you have artificial nails or gel coating that need to be removed by a nail salon, please have this removed prior to surgery. Artificial nails or gel coating may interfere with anesthesia's ability to adequately monitor your vital signs.  [facility] is not responsible for any belongings or valuables.    Do NOT Smoke (Tobacco/Vaping)  24 hours prior to your procedure  If you use a CPAP at night, you may bring your mask for your overnight stay.   Contacts, glasses, hearing aids, dentures or partials may not be worn into surgery, please bring cases for these belongings   For patients admitted to the hospital, discharge time will be determined by your treatment team.   Patients discharged the day of surgery will not be allowed to drive home, and someone needs to stay with them for 24 hours.   SURGICAL WAITING ROOM VISITATION Patients having surgery or a procedure may have no more than 2 support people in the waiting area - these visitors may rotate.   Children under the age of 90 must have an adult with them who is not the patient. If the patient needs to stay at the hospital during part of their recovery, the visitor  guidelines for inpatient rooms apply. Pre-op nurse will coordinate an appropriate time for 1 support person to accompany patient in pre-op.  This support person may not rotate.   Please refer to [email or web address] for the visitor guidelines for Inpatients (after your surgery is over and you are in a regular room).    Special instructions:    Oral Hygiene is also important to reduce your risk of infection.   Remember - BRUSH YOUR TEETH THE MORNING OF SURGERY WITH YOUR REGULAR TOOTHPASTE   [place]- Preparing For Surgery  Before surgery, you can play an important role. Because skin is not sterile, your skin needs to be as free of germs as possible. You can reduce the number of germs on your skin by washing with CHG (chlorahexidine gluconate) Soap before surgery.  CHG is an antiseptic cleaner which kills germs and bonds with the skin to continue killing germs even after washing.     Please do not use if you have an allergy to CHG or antibacterial soaps. If your skin becomes reddened/irritated stop using the CHG.  Do not shave (including legs and underarms) for at least 48 hours prior to first CHG shower. It is OK to shave your face.  Please follow these instructions carefully.     Shower the NIGHT BEFORE SURGERY and the MORNING OF SURGERY with CHG Soap.   If you chose to wash your hair, wash your hair first as usual with your normal shampoo. After you shampoo, rinse your hair and body thoroughly to remove the shampoo.  Then [employer] and genitals (private parts) with your normal soap and rinse thoroughly to remove soap.  After that Use CHG Soap as you would any other liquid soap. You can apply CHG directly to the skin and wash gently with a scrungie or a clean washcloth.   Apply the CHG Soap to your body ONLY FROM THE NECK DOWN.  Do not use on open wounds or open sores. Avoid contact with your eyes, ears, mouth and genitals (private parts). Wash Face and genitals (private parts)  with your normal soap.   Wash thoroughly, paying special attention to the area where your surgery will be performed.  Thoroughly rinse your body with warm water from the neck down.  DO NOT shower/wash with your normal soap after using and rinsing off the CHG Soap.  Pat yourself dry with a CLEAN TOWEL.  Wear CLEAN PAJAMAS to bed the night before surgery  Place CLEAN SHEETS on your bed the night before your  surgery  DO NOT SLEEP WITH PETS.   Day of Surgery:  Take a shower with CHG soap. Wear Clean/Comfortable clothing the morning of surgery Do not apply any deodorants/lotions.   Remember to brush your teeth WITH YOUR REGULAR TOOTHPASTE.    If you received a COVID test during your pre-op visit, it is requested that you wear a mask when out in public, stay away from anyone that may not be feeling well, and notify your surgeon if you develop symptoms. If you have been in contact with anyone that has tested positive in the last 10 days, please notify your surgeon.    Please read over the following fact sheets that you were given.

## 2023-02-06 ENCOUNTER — Encounter (HOSPITAL_COMMUNITY): Payer: Self-pay

## 2023-02-06 ENCOUNTER — Other Ambulatory Visit: Payer: Self-pay

## 2023-02-06 ENCOUNTER — Encounter (HOSPITAL_COMMUNITY)
Admission: RE | Admit: 2023-02-06 | Discharge: 2023-02-06 | Disposition: A | Discharge status: Home / Self Care | Payer: 59 | Source: Ambulatory Visit | Attending: Orthopedic Surgery | Admitting: Orthopedic Surgery

## 2023-02-06 VITALS — BP 120/87 | HR 83 | Temp 98.3°F | Resp 18 | Ht 72.0 in | Wt 171.0 lb

## 2023-02-06 DIAGNOSIS — Z01812 Encounter for preprocedural laboratory examination: Secondary | ICD-10-CM | POA: Diagnosis present

## 2023-02-06 DIAGNOSIS — Z01818 Encounter for other preprocedural examination: Secondary | ICD-10-CM

## 2023-02-06 DIAGNOSIS — M4306 Spondylolysis, lumbar region: Secondary | ICD-10-CM | POA: Diagnosis not present

## 2023-02-06 LAB — CBC
HCT: 44.6 % (ref 39.0–52.0)
Hemoglobin: 14.6 g/dL (ref 13.0–17.0)
MCH: 32.5 pg (ref 26.0–34.0)
MCHC: 32.7 g/dL (ref 30.0–36.0)
MCV: 99.3 fL (ref 80.0–100.0)
Platelets: 312 10*3/uL (ref 150–400)
RBC: 4.49 MIL/uL (ref 4.22–5.81)
RDW: 13.8 % (ref 11.5–15.5)
WBC: 9.6 10*3/uL (ref 4.0–10.5)
nRBC: 0 % (ref 0.0–0.2)

## 2023-02-06 LAB — BASIC METABOLIC PANEL
Anion gap: 5 (ref 5–15)
BUN: 15 mg/dL (ref 6–20)
CO2: 25 mmol/L (ref 22–32)
Calcium: 9.1 mg/dL (ref 8.9–10.3)
Chloride: 104 mmol/L (ref 98–111)
Creatinine, Ser: 0.84 mg/dL (ref 0.61–1.24)
GFR, Estimated: >60 mL/min (ref >60)
Glucose, Bld: 102 mg/dL — ABNORMAL HIGH (ref 70–99)
Potassium: 4.2 mmol/L (ref 3.5–5.1)
Sodium: 134 mmol/L — ABNORMAL LOW (ref 135–145)

## 2023-02-06 LAB — NO BLOOD PRODUCTS

## 2023-02-06 LAB — SURGICAL PCR SCREEN
MRSA, PCR: NEGATIVE
Staphylococcus aureus: NEGATIVE

## 2023-02-06 NOTE — Progress Notes (Signed)
Staff message sent to Dr. Carlis Abbott and [name] regarding blood product refusal. This is a prior FYI in pt's chart.

## 2023-02-06 NOTE — Progress Notes (Signed)
PCP - Dr. Viviana Simpler Cardiologist - denies  PPM/ICD - denies   Chest x-ray - 08/28/22 EKG - 08/30/22 Stress Test - 08/26/14 ECHO - denies Cardiac Cath - denies  Sleep Study - 05/21/19, OSA+. Pt states he used to use a CPAP but he had gastric  bypass and lost weight. No longer needed CPAP, per pt   CPAP - denies  DM- denies    ASA/Blood Thinner Instructions: n/a   ERAS Protcol - yes PRE-SURGERY Ensure given at PAT  COVID TEST- n/a   Anesthesia review: no  Patient denies shortness of breath, fever, cough and chest pain at PAT appointment   All instructions explained to the patient, with a verbal understanding of the material. Patient agrees to go over the instructions while at home for a better understanding.  The opportunity to ask questions was provided.

## 2023-02-10 ENCOUNTER — Encounter: Payer: Self-pay | Admitting: Oncology

## 2023-02-10 ENCOUNTER — Telehealth: Payer: Self-pay

## 2023-02-10 MED ORDER — ZOLPIDEM TARTRATE ER 12.5 MG PO TBCR: EXTENDED_RELEASE_TABLET | ORAL | 0 refills | Status: DC

## 2023-02-10 NOTE — Telephone Encounter (Signed)
Spoke to pt. He will use a GoodRx card at [employer]. Will need a new Rx sent.

## 2023-02-10 NOTE — Telephone Encounter (Signed)
Pharmacy Patient Advocate Encounter  Received notification from [facility] that the request for prior authorization for Zolpidem ER has been denied due to .    Please be advised we currently do not have a Pharmacist to review denials, therefore you will need to process appeals accordingly as needed. Thanks for your support at this time.   You may call [phone] or fax [phone], to appeal.  Will index denial to chart, including appeal form.

## 2023-02-10 NOTE — Addendum Note (Signed)
Addended by: Pilar Grammes on: 02/10/2023 01:29 PM   Modules accepted: Orders

## 2023-02-10 NOTE — Addendum Note (Signed)
Addended by: Viviana Simpler I on: 02/10/2023 01:30 PM   Modules accepted: Orders

## 2023-02-13 ENCOUNTER — Inpatient Hospital Stay (HOSPITAL_COMMUNITY): Admission: RE | Admit: 2023-02-13 | Payer: 59 | Source: Home / Self Care | Admitting: Orthopedic Surgery

## 2023-02-13 DIAGNOSIS — M4306 Spondylolysis, lumbar region: Secondary | ICD-10-CM

## 2023-02-13 SURGERY — ANTERIOR LUMBAR FUSION 1 LEVEL
Anesthesia: General

## 2023-02-20 ENCOUNTER — Other Ambulatory Visit: Payer: Self-pay

## 2023-02-20 DIAGNOSIS — M25561 Pain in right knee: Secondary | ICD-10-CM | POA: Diagnosis not present

## 2023-02-20 DIAGNOSIS — S72461D Displaced supracondylar fracture with intracondylar extension of lower end of right femur, subsequent encounter for closed fracture with routine healing: Secondary | ICD-10-CM | POA: Diagnosis not present

## 2023-02-21 ENCOUNTER — Other Ambulatory Visit: Payer: Self-pay

## 2023-02-24 ENCOUNTER — Other Ambulatory Visit: Payer: Self-pay

## 2023-02-24 DIAGNOSIS — Z419 Encounter for procedure for purposes other than remedying health state, unspecified: Secondary | ICD-10-CM | POA: Diagnosis not present

## 2023-02-27 ENCOUNTER — Encounter: Payer: 59 | Admitting: Orthopedic Surgery

## 2023-02-28 NOTE — Pre-Procedure Instructions (Signed)
Surgical Instructions    Your procedure is scheduled on March 06, 2023.  Report to [facility] Main Entrance "A" at 5:30 A.M., then check in with the Admitting office.  Call this number if you have problems the morning of surgery:  [phone]  If you have any questions prior to your surgery date call [phone]: Open Monday-Friday 8am-4pm If you experience any cold or flu symptoms such as cough, fever, chills, shortness of breath, etc. between now and your scheduled surgery, please notify us at the above number.     Remember:  Do not eat after midnight the night before your surgery  You may drink clear liquids until 4:30 AM the morning of your surgery.   Clear liquids allowed are: Water, Non-Citrus Juices (without pulp), Carbonated Beverages, Clear Tea, Black Coffee Only (NO MILK, CREAM OR POWDERED CREAMER of any kind), and Gatorade.  Patient Instructions  The night before surgery:  No food after midnight. ONLY clear liquids after midnight  The day of surgery (if you do NOT have diabetes):  Drink ONE (1) Pre-Surgery Clear Ensure by 4:30 AM the morning of surgery. Drink in one sitting. Do not sip.  This drink was given to you during your hospital  pre-op appointment visit.  Nothing else to drink after completing the  Pre-Surgery Clear Ensure.         If you have questions, please contact your surgeon's office.      Take these medicines the morning of surgery with A SIP OF WATER:  cetirizine (ZYRTEC)   DULoxetine (CYMBALTA)   dicyclomine (BENTYL)   fluticasone (FLONASE)   gabapentin (NEURONTIN)   ipratropium (ATROVENT) nasal spray   omeprazole (PRILOSEC)    May take these medicines IF NEEDED:  cyclobenzaprine (FLEXERIL)   HYDROcodone-acetaminophen (NORCO/VICODIN)   methocarbamol (ROBAXIN)    As of today, STOP taking any Aspirin (unless otherwise instructed by your surgeon) Aleve, Naproxen, Ibuprofen, Motrin, Advil, Goody's, BC's, all herbal medications, fish oil,  and all vitamins. This includes your medication: meloxicam (MOBIC)                      Do NOT Smoke (Tobacco/Vaping) for 24 hours prior to your procedure.  If you use a CPAP at night, you may bring your mask/headgear for your overnight stay.   Contacts, glasses, piercing's, hearing aid's, dentures or partials may not be worn into surgery, please bring cases for these belongings.    For patients admitted to the hospital, discharge time will be determined by your treatment team.   Patients discharged the day of surgery will not be allowed to drive home, and someone needs to stay with them for 24 hours.  SURGICAL WAITING ROOM VISITATION Patients having surgery or a procedure may have no more than 2 support people in the waiting area - these visitors may rotate.   Children under the age of 37 must have an adult with them who is not the patient. If the patient needs to stay at the hospital during part of their recovery, the visitor guidelines for inpatient rooms apply. Pre-op nurse will coordinate an appropriate time for 1 support person to accompany patient in pre-op.  This support person may not rotate.   Please refer to the [facility] website for the visitor guidelines for Inpatients (after your surgery is over and you are in a regular room).    Special instructions:   [place]- Preparing For Surgery  Before surgery, you can play an important role. Because  skin is not sterile, your skin needs to be as free of germs as possible. You can reduce the number of germs on your skin by washing with CHG (chlorahexidine gluconate) Soap before surgery.  CHG is an antiseptic cleaner which kills germs and bonds with the skin to continue killing germs even after washing.    Oral Hygiene is also important to reduce your risk of infection.  Remember - BRUSH YOUR TEETH THE MORNING OF SURGERY WITH YOUR REGULAR TOOTHPASTE  Please do not use if you have an allergy to CHG or antibacterial soaps. If your  skin becomes reddened/irritated stop using the CHG.  Do not shave (including legs and underarms) for at least 48 hours prior to first CHG shower. It is OK to shave your face.  Please follow these instructions carefully.   Shower the NIGHT BEFORE SURGERY and the MORNING OF SURGERY  If you chose to wash your hair, wash your hair first as usual with your normal shampoo.  After you shampoo, rinse your hair and body thoroughly to remove the shampoo.  Use CHG Soap as you would any other liquid soap. You can apply CHG directly to the skin and wash gently with a scrungie or a clean washcloth.   Apply the CHG Soap to your body ONLY FROM THE NECK DOWN.  Do not use on open wounds or open sores. Avoid contact with your eyes, ears, mouth and genitals (private parts). Wash Face and genitals (private parts)  with your normal soap.   Wash thoroughly, paying special attention to the area where your surgery will be performed.  Thoroughly rinse your body with warm water from the neck down.  DO NOT shower/wash with your normal soap after using and rinsing off the CHG Soap.  Pat yourself dry with a CLEAN TOWEL.  Wear CLEAN PAJAMAS to bed the night before surgery  Place CLEAN SHEETS on your bed the night before your surgery  DO NOT SLEEP WITH PETS.   Day of Surgery: Take a shower with CHG soap. Do not wear jewelry or makeup Do not wear lotions, powders, perfumes/colognes, or deodorant. Do not shave 48 hours prior to surgery.  Men may shave face and neck. Do not bring valuables to the hospital.  [facility] is not responsible for any belongings or valuables. Do not wear nail polish, gel polish, artificial nails, or any other type of covering on natural nails (fingers and toes) If you have artificial nails or gel coating that need to be removed by a nail salon, please have this removed prior to surgery. Artificial nails or gel coating may interfere with anesthesia's ability to adequately monitor your  vital signs.  Wear Clean/Comfortable clothing the morning of surgery Remember to brush your teeth WITH YOUR REGULAR TOOTHPASTE.   Please read over the following fact sheets that you were given.    If you received a COVID test during your pre-op visit  it is requested that you wear a mask when out in public, stay away from anyone that may not be feeling well and notify your surgeon if you develop symptoms. If you have been in contact with anyone that has tested positive in the last 10 days please notify you surgeon.

## 2023-03-01 ENCOUNTER — Encounter (HOSPITAL_COMMUNITY): Payer: Self-pay

## 2023-03-01 ENCOUNTER — Other Ambulatory Visit: Payer: Self-pay

## 2023-03-01 ENCOUNTER — Encounter (HOSPITAL_COMMUNITY)
Admission: RE | Admit: 2023-03-01 | Discharge: 2023-03-01 | Disposition: A | Discharge status: Home / Self Care | Payer: 59 | Source: Ambulatory Visit | Attending: Orthopedic Surgery | Admitting: Orthopedic Surgery

## 2023-03-01 DIAGNOSIS — G4733 Obstructive sleep apnea (adult) (pediatric): Secondary | ICD-10-CM | POA: Diagnosis not present

## 2023-03-01 DIAGNOSIS — D509 Iron deficiency anemia, unspecified: Secondary | ICD-10-CM | POA: Diagnosis not present

## 2023-03-01 DIAGNOSIS — Z01812 Encounter for preprocedural laboratory examination: Secondary | ICD-10-CM | POA: Diagnosis not present

## 2023-03-01 DIAGNOSIS — I10 Essential (primary) hypertension: Secondary | ICD-10-CM | POA: Insufficient documentation

## 2023-03-01 LAB — NO BLOOD PRODUCTS

## 2023-03-01 NOTE — Progress Notes (Signed)
PCP - Dr. Viviana Simpler Cardiologist - Denies  PPM/ICD - Denies Device Orders - n/a Rep Notified - n/a  Chest x-ray - 08/28/2022 EKG - 08/30/2022 Stress Test - 08/26/2014 - Results requested ECHO - 10/26/2021 Cardiac Cath - Denies  Sleep Study - +OSA in 2009. Pt had gastric bypass surgery (pt was 400+ lbs) in 2009. After significant weight loss, he stopped using his CPAP after 2010. Has not used it since and kept weight off.  No DM  Last dose of GLP1 agonist- n/a GLP1 instructions: n/a  Blood Thinner Instructions: n/a Aspirin Instructions: n/a  ERAS Protcol - Clear liquids until 0430 PRE-SURGERY Ensure or G2- Ensure given to pt with instructions  COVID TEST- n/a   Anesthesia review: Yes. Stress test results requested. Medical Clearance given by PCP   Patient denies shortness of breath, fever, cough and chest pain at PAT appointment. Pt denies any respiratory illness/infection in the past two months.   All instructions explained to the patient, with a verbal understanding of the material. Patient agrees to go over the instructions while at home for a better understanding. Patient also instructed to self quarantine after being tested for COVID-19. The opportunity to ask questions was provided.

## 2023-03-02 NOTE — Progress Notes (Signed)
Anesthesia Chart Review:  57 year old male with pertinent history including HTN, GERD, iron deficiency anemia.  Chronic conditions followed by PCP Dr. Silvio Pate.  Last seen 10/14/2022 and discussed that he was working to get scheduled for lumbar fusion. No acute concerns at that time. Dr. Silvio Pate cleared patient for surgery on 11/28/22, copy on chart.   History of laparoscopic gastric bypass 2009 with subsequent ~200 pound weight loss.  Patient states he was previously on CPAP for OSA but has not required it since losing weight.  History of echocardiogram 10/2021 which showed EF 55 to 60%, grade 1 DD, normal RV size and function, mild biatrial enlargement, trace to mild AR regurgitation, trace to mild tricuspid vegetation.  Patient also states that he had a stress test done in 2015 at [facility] which he reports was normal.  Records requested, however, [facility] states they do not have any records on file for this patient.  Patient refuses all blood products.  Preop labs reviewed, sodium mildly low at 134, otherwise unremarkable.  EKG 08/30/2022: Sinus rhythm with PACs.  Rate 95.  TTE 10/26/2021 (Care Everywhere): Conclusions         1: Normal left ventricular cavity size with mild concentric hypertrophy.   2: The left ventricular ejection fraction is normal, estimated at 55-60%.   3: There is grade I diastolic dysfunction of the left ventricle.  4: Normal right ventricular size and systolic function.  5: Mild biatrial enlargement.  6: The aortic root measures 4.1cm. The proximal ascending aorta measures 4.2 cm.  7: Aortic valve sclerosis without stenosis.  Trace to mild aortic regurgitation.  8: Trace to mild mitral and tricuspid regurgitations.  9: Estimated PASP of 34 mmHg.  10: No prior study for comparison.     Wynonia Musty [facility] Short Stay Center/Anesthesiology Phone [phone] 03/02/2023 9:54 AM

## 2023-03-02 NOTE — Anesthesia Preprocedure Evaluation (Addendum)
Anesthesia Evaluation  Patient identified by MRN, date of birth, ID band Patient awake    Reviewed: Allergy & Precautions, NPO status , Patient's Chart, lab work & pertinent test results  History of Anesthesia Complications Negative for: history of anesthetic complications  Airway Mallampati: II  TM Distance: >3 FB Neck ROM: Full    Dental  (+) Dental Advisory Given   Pulmonary sleep apnea (no CPAP since weight surgery/weight loss) , Current Smoker and Patient abstained from smoking.   Pulmonary exam normal        Cardiovascular hypertension, Pt. on medications Normal cardiovascular exam     Neuro/Psych  PSYCHIATRIC DISORDERS Anxiety Depression    negative neurological ROS     GI/Hepatic Neg liver ROS, PUD,GERD  Medicated and Controlled,,  Endo/Other  negative endocrine ROS    Renal/GU negative Renal ROS     Musculoskeletal  (+) Arthritis ,    Abdominal   Peds  Hematology  (+) REFUSES BLOOD PRODUCTS (ok for cell saver and albumin)  Anesthesia Other Findings   Reproductive/Obstetrics                              Anesthesia Physical Anesthesia Plan  ASA: 2  Anesthesia Plan: General   Post-op Pain Management: Tylenol PO (pre-op)*   Induction: Intravenous  PONV Risk Score and Plan: 2 and Treatment may vary due to age or medical condition, Ondansetron, Dexamethasone and Midazolam  Airway Management Planned: Oral ETT  Additional Equipment: Arterial line  Intra-op Plan:   Post-operative Plan: Extubation in OR  Informed Consent: I have reviewed the patients History and Physical, chart, labs and discussed the procedure including the risks, benefits and alternatives for the proposed anesthesia with the patient or authorized representative who has indicated his/her understanding and acceptance.     Dental advisory given  Plan Discussed with: CRNA and Anesthesiologist  Anesthesia  Plan Comments: (See PAT note)         Anesthesia Quick Evaluation

## 2023-03-03 ENCOUNTER — Other Ambulatory Visit (HOSPITAL_COMMUNITY): Payer: Self-pay

## 2023-03-06 ENCOUNTER — Encounter (HOSPITAL_COMMUNITY): Payer: Self-pay | Admitting: Orthopedic Surgery

## 2023-03-06 ENCOUNTER — Inpatient Hospital Stay (HOSPITAL_COMMUNITY): Payer: 59

## 2023-03-06 ENCOUNTER — Encounter (HOSPITAL_COMMUNITY)
Admission: RE | Disposition: A | Discharge status: Home / Self Care | Payer: Self-pay | Source: Home / Self Care | Attending: Orthopedic Surgery

## 2023-03-06 ENCOUNTER — Inpatient Hospital Stay (HOSPITAL_COMMUNITY)
Admission: RE | Admit: 2023-03-06 | Discharge: 2023-03-11 | DRG: 455 | Disposition: A | Discharge status: Home / Self Care | Payer: 59 | Attending: Orthopedic Surgery | Admitting: Orthopedic Surgery

## 2023-03-06 ENCOUNTER — Other Ambulatory Visit: Payer: Self-pay

## 2023-03-06 ENCOUNTER — Inpatient Hospital Stay (HOSPITAL_COMMUNITY): Payer: 59 | Admitting: Physician Assistant

## 2023-03-06 ENCOUNTER — Inpatient Hospital Stay (HOSPITAL_COMMUNITY): Payer: 59 | Admitting: Anesthesiology

## 2023-03-06 DIAGNOSIS — Z9884 Bariatric surgery status: Secondary | ICD-10-CM

## 2023-03-06 DIAGNOSIS — M810 Age-related osteoporosis without current pathological fracture: Secondary | ICD-10-CM | POA: Diagnosis present

## 2023-03-06 DIAGNOSIS — Z8249 Family history of ischemic heart disease and other diseases of the circulatory system: Secondary | ICD-10-CM

## 2023-03-06 DIAGNOSIS — K589 Irritable bowel syndrome without diarrhea: Secondary | ICD-10-CM | POA: Diagnosis present

## 2023-03-06 DIAGNOSIS — I10 Essential (primary) hypertension: Secondary | ICD-10-CM | POA: Diagnosis present

## 2023-03-06 DIAGNOSIS — E559 Vitamin D deficiency, unspecified: Secondary | ICD-10-CM | POA: Diagnosis present

## 2023-03-06 DIAGNOSIS — M4316 Spondylolisthesis, lumbar region: Secondary | ICD-10-CM | POA: Diagnosis not present

## 2023-03-06 DIAGNOSIS — J301 Allergic rhinitis due to pollen: Secondary | ICD-10-CM | POA: Diagnosis present

## 2023-03-06 DIAGNOSIS — M48061 Spinal stenosis, lumbar region without neurogenic claudication: Secondary | ICD-10-CM | POA: Diagnosis not present

## 2023-03-06 DIAGNOSIS — Z961 Presence of intraocular lens: Secondary | ICD-10-CM | POA: Diagnosis not present

## 2023-03-06 DIAGNOSIS — F419 Anxiety disorder, unspecified: Secondary | ICD-10-CM | POA: Diagnosis present

## 2023-03-06 DIAGNOSIS — G4730 Sleep apnea, unspecified: Secondary | ICD-10-CM | POA: Diagnosis not present

## 2023-03-06 DIAGNOSIS — I959 Hypotension, unspecified: Secondary | ICD-10-CM | POA: Diagnosis not present

## 2023-03-06 DIAGNOSIS — M5416 Radiculopathy, lumbar region: Secondary | ICD-10-CM | POA: Diagnosis present

## 2023-03-06 DIAGNOSIS — M898X9 Other specified disorders of bone, unspecified site: Secondary | ICD-10-CM | POA: Diagnosis not present

## 2023-03-06 DIAGNOSIS — S32010A Wedge compression fracture of first lumbar vertebra, initial encounter for closed fracture: Secondary | ICD-10-CM | POA: Diagnosis not present

## 2023-03-06 DIAGNOSIS — Z79899 Other long term (current) drug therapy: Secondary | ICD-10-CM

## 2023-03-06 DIAGNOSIS — M4317 Spondylolisthesis, lumbosacral region: Secondary | ICD-10-CM | POA: Diagnosis not present

## 2023-03-06 DIAGNOSIS — F32A Depression, unspecified: Secondary | ICD-10-CM | POA: Diagnosis present

## 2023-03-06 DIAGNOSIS — M5417 Radiculopathy, lumbosacral region: Secondary | ICD-10-CM

## 2023-03-06 DIAGNOSIS — G8929 Other chronic pain: Secondary | ICD-10-CM | POA: Diagnosis not present

## 2023-03-06 DIAGNOSIS — M4726 Other spondylosis with radiculopathy, lumbar region: Secondary | ICD-10-CM | POA: Diagnosis present

## 2023-03-06 DIAGNOSIS — Z9889 Other specified postprocedural states: Secondary | ICD-10-CM | POA: Diagnosis not present

## 2023-03-06 DIAGNOSIS — M4326 Fusion of spine, lumbar region: Secondary | ICD-10-CM | POA: Diagnosis not present

## 2023-03-06 DIAGNOSIS — Z9049 Acquired absence of other specified parts of digestive tract: Secondary | ICD-10-CM

## 2023-03-06 DIAGNOSIS — K219 Gastro-esophageal reflux disease without esophagitis: Secondary | ICD-10-CM | POA: Diagnosis not present

## 2023-03-06 HISTORY — PX: ANTERIOR LUMBAR FUSION: SHX1170

## 2023-03-06 HISTORY — PX: ABDOMINAL EXPOSURE: SHX5708

## 2023-03-06 LAB — CBC
HCT: 39.4 % (ref 39.0–52.0)
Hemoglobin: 13.1 g/dL (ref 13.0–17.0)
MCH: 32.5 pg (ref 26.0–34.0)
MCHC: 33.2 g/dL (ref 30.0–36.0)
MCV: 97.8 fL (ref 80.0–100.0)
Platelets: 297 10*3/uL (ref 150–400)
RBC: 4.03 MIL/uL — ABNORMAL LOW (ref 4.22–5.81)
RDW: 13.9 % (ref 11.5–15.5)
WBC: 11.2 10*3/uL — ABNORMAL HIGH (ref 4.0–10.5)
nRBC: 0 % (ref 0.0–0.2)

## 2023-03-06 LAB — BASIC METABOLIC PANEL
Anion gap: 10 (ref 5–15)
BUN: 10 mg/dL (ref 6–20)
CO2: 21 mmol/L — ABNORMAL LOW (ref 22–32)
Calcium: 8.6 mg/dL — ABNORMAL LOW (ref 8.9–10.3)
Chloride: 104 mmol/L (ref 98–111)
Creatinine, Ser: 0.75 mg/dL (ref 0.61–1.24)
GFR, Estimated: >60 mL/min (ref >60)
Glucose, Bld: 96 mg/dL (ref 70–99)
Potassium: 3.5 mmol/L (ref 3.5–5.1)
Sodium: 135 mmol/L (ref 135–145)

## 2023-03-06 SURGERY — ANTERIOR LUMBAR FUSION 1 LEVEL
Anesthesia: General

## 2023-03-06 MED ORDER — HYDROMORPHONE HCL 1 MG/ML IJ SOLN
INTRAMUSCULAR | Status: DC | PRN
  Administered 2023-03-06 (×3): .5 mg via INTRAVENOUS

## 2023-03-06 MED ORDER — ACETAMINOPHEN 500 MG PO TABS
1000.0000 mg | ORAL_TABLET | Freq: Once | ORAL | Status: AC
  Administered 2023-03-06: 1000 mg via ORAL
  Filled 2023-03-06: qty 2

## 2023-03-06 MED ORDER — FERROUS SULFATE 325 (65 FE) MG PO TABS
325.0000 mg | ORAL_TABLET | Freq: Every day | ORAL | Status: DC
  Administered 2023-03-06 – 2023-03-11 (×6): 325 mg via ORAL
  Filled 2023-03-06 (×6): qty 1

## 2023-03-06 MED ORDER — QUETIAPINE FUMARATE 25 MG PO TABS
25.0000 mg | ORAL_TABLET | Freq: Every evening | ORAL | Status: DC | PRN
  Administered 2023-03-07 – 2023-03-10 (×5): 50 mg via ORAL
  Filled 2023-03-06 (×5): qty 2

## 2023-03-06 MED ORDER — GELATIN ABSORBABLE 100 EX MISC
CUTANEOUS | Status: DC | PRN
  Administered 2023-03-06: 1 via TOPICAL

## 2023-03-06 MED ORDER — POVIDONE-IODINE 10 % EX SWAB: 2.0000 | Freq: Once | CUTANEOUS | Status: DC

## 2023-03-06 MED ORDER — DOCUSATE SODIUM 100 MG PO CAPS
100.0000 mg | ORAL_CAPSULE | Freq: Two times a day (BID) | ORAL | Status: DC
  Administered 2023-03-06 – 2023-03-11 (×9): 100 mg via ORAL
  Filled 2023-03-06 (×9): qty 1

## 2023-03-06 MED ORDER — METHOCARBAMOL 500 MG PO TABS
ORAL_TABLET | ORAL | Status: AC
  Filled 2023-03-06: qty 1

## 2023-03-06 MED ORDER — METOPROLOL TARTRATE 5 MG/5ML IV SOLN
INTRAVENOUS | Status: AC
  Filled 2023-03-06: qty 5

## 2023-03-06 MED ORDER — METHOCARBAMOL 500 MG PO TABS
500.0000 mg | ORAL_TABLET | Freq: Four times a day (QID) | ORAL | Status: DC
  Administered 2023-03-06 – 2023-03-11 (×21): 500 mg via ORAL
  Filled 2023-03-06 (×21): qty 1

## 2023-03-06 MED ORDER — OXYCODONE HCL 5 MG PO TABS
ORAL_TABLET | ORAL | Status: AC
  Filled 2023-03-06: qty 1

## 2023-03-06 MED ORDER — ONDANSETRON HCL 4 MG PO TABS: 4.0000 mg | ORAL_TABLET | Freq: Four times a day (QID) | ORAL | Status: DC | PRN

## 2023-03-06 MED ORDER — CHLORHEXIDINE GLUCONATE CLOTH 2 % EX PADS: 6.0000 | MEDICATED_PAD | Freq: Once | CUTANEOUS | Status: DC

## 2023-03-06 MED ORDER — HYDROMORPHONE HCL 1 MG/ML IJ SOLN
INTRAMUSCULAR | Status: AC
  Filled 2023-03-06: qty 1

## 2023-03-06 MED ORDER — LISINOPRIL 20 MG PO TABS
20.0000 mg | ORAL_TABLET | Freq: Every day | ORAL | Status: DC
  Administered 2023-03-07 – 2023-03-11 (×5): 20 mg via ORAL
  Filled 2023-03-06 (×5): qty 1

## 2023-03-06 MED ORDER — PROPOFOL 10 MG/ML IV BOLUS
INTRAVENOUS | Status: AC
  Filled 2023-03-06: qty 20

## 2023-03-06 MED ORDER — PANTOPRAZOLE SODIUM 40 MG PO TBEC
40.0000 mg | DELAYED_RELEASE_TABLET | Freq: Every day | ORAL | Status: DC
  Administered 2023-03-06 – 2023-03-11 (×6): 40 mg via ORAL
  Filled 2023-03-06 (×6): qty 1

## 2023-03-06 MED ORDER — ACETAMINOPHEN 500 MG PO TABS
ORAL_TABLET | ORAL | Status: AC
  Administered 2023-03-06: 1000 mg via ORAL
  Filled 2023-03-06: qty 2

## 2023-03-06 MED ORDER — HYDROMORPHONE HCL 1 MG/ML IJ SOLN
0.5000 mg | INTRAMUSCULAR | Status: DC | PRN
  Administered 2023-03-06 – 2023-03-09 (×12): 1 mg via INTRAVENOUS
  Filled 2023-03-06 (×11): qty 1

## 2023-03-06 MED ORDER — GABAPENTIN 400 MG PO CAPS
1800.0000 mg | ORAL_CAPSULE | Freq: Every evening | ORAL | Status: DC | PRN
  Administered 2023-03-06 – 2023-03-10 (×4): 1800 mg via ORAL
  Filled 2023-03-06 (×4): qty 2

## 2023-03-06 MED ORDER — CEFAZOLIN SODIUM-DEXTROSE 2-4 GM/100ML-% IV SOLN
2.0000 g | INTRAVENOUS | Status: AC
  Administered 2023-03-06 (×2): 2 g via INTRAVENOUS
  Filled 2023-03-06: qty 100

## 2023-03-06 MED ORDER — PHENYLEPHRINE 80 MCG/ML (10ML) SYRINGE FOR IV PUSH (FOR BLOOD PRESSURE SUPPORT)
PREFILLED_SYRINGE | INTRAVENOUS | Status: DC | PRN
  Administered 2023-03-06 (×2): 80 ug via INTRAVENOUS

## 2023-03-06 MED ORDER — OXYCODONE HCL 5 MG PO TABS
5.0000 mg | ORAL_TABLET | ORAL | Status: DC | PRN
  Administered 2023-03-06 – 2023-03-07 (×6): 10 mg via ORAL
  Filled 2023-03-06: qty 2
  Filled 2023-03-06: qty 1
  Filled 2023-03-06 (×3): qty 2
  Filled 2023-03-06: qty 1
  Filled 2023-03-06: qty 2

## 2023-03-06 MED ORDER — DEXAMETHASONE SODIUM PHOSPHATE 10 MG/ML IJ SOLN
10.0000 mg | Freq: Once | INTRAMUSCULAR | Status: AC
  Administered 2023-03-06: 10 mg via INTRAVENOUS
  Filled 2023-03-06: qty 1

## 2023-03-06 MED ORDER — VANCOMYCIN HCL 1000 MG IV SOLR
INTRAVENOUS | Status: AC
  Filled 2023-03-06: qty 20

## 2023-03-06 MED ORDER — DEXAMETHASONE SODIUM PHOSPHATE 10 MG/ML IJ SOLN
INTRAMUSCULAR | Status: AC
  Filled 2023-03-06: qty 1

## 2023-03-06 MED ORDER — ZINC SULFATE 220 (50 ZN) MG PO CAPS
220.0000 mg | ORAL_CAPSULE | Freq: Every day | ORAL | Status: DC
  Administered 2023-03-06 – 2023-03-11 (×6): 220 mg via ORAL
  Filled 2023-03-06 (×6): qty 1

## 2023-03-06 MED ORDER — OXYCODONE HCL 5 MG PO TABS
5.0000 mg | ORAL_TABLET | Freq: Once | ORAL | Status: AC | PRN
  Administered 2023-03-06: 5 mg via ORAL

## 2023-03-06 MED ORDER — SUGAMMADEX SODIUM 200 MG/2ML IV SOLN
INTRAVENOUS | Status: DC | PRN
  Administered 2023-03-06: 200 mg via INTRAVENOUS

## 2023-03-06 MED ORDER — 0.9 % SODIUM CHLORIDE (POUR BTL) OPTIME
TOPICAL | Status: DC | PRN
  Administered 2023-03-06: 1000 mL

## 2023-03-06 MED ORDER — ESMOLOL HCL 100 MG/10ML IV SOLN
INTRAVENOUS | Status: DC | PRN
  Administered 2023-03-06 (×2): 20 mg via INTRAVENOUS

## 2023-03-06 MED ORDER — HYDROMORPHONE HCL 1 MG/ML IJ SOLN
INTRAMUSCULAR | Status: AC
  Filled 2023-03-06: qty 0.5

## 2023-03-06 MED ORDER — ESMOLOL HCL 100 MG/10ML IV SOLN
INTRAVENOUS | Status: AC
  Filled 2023-03-06: qty 10

## 2023-03-06 MED ORDER — ROCURONIUM BROMIDE 10 MG/ML (PF) SYRINGE
PREFILLED_SYRINGE | INTRAVENOUS | Status: AC
  Filled 2023-03-06: qty 20

## 2023-03-06 MED ORDER — MIDAZOLAM HCL 2 MG/2ML IJ SOLN
INTRAMUSCULAR | Status: AC
  Filled 2023-03-06: qty 2

## 2023-03-06 MED ORDER — FENTANYL CITRATE (PF) 250 MCG/5ML IJ SOLN
INTRAMUSCULAR | Status: AC
  Filled 2023-03-06: qty 5

## 2023-03-06 MED ORDER — ONDANSETRON HCL 4 MG/2ML IJ SOLN
4.0000 mg | Freq: Four times a day (QID) | INTRAMUSCULAR | Status: DC | PRN
  Administered 2023-03-06 – 2023-03-08 (×2): 4 mg via INTRAVENOUS
  Filled 2023-03-06 (×2): qty 2

## 2023-03-06 MED ORDER — TRANEXAMIC ACID-NACL 1000-0.7 MG/100ML-% IV SOLN
1000.0000 mg | INTRAVENOUS | Status: AC
  Administered 2023-03-06: 1000 mg via INTRAVENOUS
  Filled 2023-03-06: qty 100

## 2023-03-06 MED ORDER — HYDROCHLOROTHIAZIDE 25 MG PO TABS
25.0000 mg | ORAL_TABLET | Freq: Every day | ORAL | Status: DC
  Administered 2023-03-07 – 2023-03-11 (×5): 25 mg via ORAL
  Filled 2023-03-06 (×5): qty 1

## 2023-03-06 MED ORDER — CALCIUM CARBONATE 1250 (500 CA) MG PO TABS
ORAL_TABLET | Freq: Every day | ORAL | Status: DC
  Administered 2023-03-06 – 2023-03-10 (×4): 1250 mg via ORAL
  Filled 2023-03-06 (×5): qty 1

## 2023-03-06 MED ORDER — VITAMIN B-12 1000 MCG PO TABS
500.0000 ug | ORAL_TABLET | Freq: Every day | ORAL | Status: DC
  Administered 2023-03-06 – 2023-03-11 (×6): 500 ug via ORAL
  Filled 2023-03-06 (×6): qty 1

## 2023-03-06 MED ORDER — VITAMIN D 25 MCG (1000 UNIT) PO TABS
5000.0000 [IU] | ORAL_TABLET | Freq: Every day | ORAL | Status: DC
  Administered 2023-03-06 – 2023-03-11 (×6): 5000 [IU] via ORAL
  Filled 2023-03-06 (×6): qty 5

## 2023-03-06 MED ORDER — PHENYLEPHRINE HCL-NACL 20-0.9 MG/250ML-% IV SOLN
INTRAVENOUS | Status: AC
  Filled 2023-03-06: qty 250

## 2023-03-06 MED ORDER — ONDANSETRON HCL 4 MG/2ML IJ SOLN
INTRAMUSCULAR | Status: AC
  Filled 2023-03-06: qty 2

## 2023-03-06 MED ORDER — ACETAMINOPHEN 500 MG PO TABS
1000.0000 mg | ORAL_TABLET | Freq: Three times a day (TID) | ORAL | Status: AC
  Administered 2023-03-06 – 2023-03-07 (×3): 1000 mg via ORAL
  Filled 2023-03-06 (×4): qty 2

## 2023-03-06 MED ORDER — LORATADINE 10 MG PO TABS
10.0000 mg | ORAL_TABLET | Freq: Every day | ORAL | Status: DC
  Administered 2023-03-06 – 2023-03-11 (×6): 10 mg via ORAL
  Filled 2023-03-06 (×6): qty 1

## 2023-03-06 MED ORDER — METOPROLOL TARTRATE 5 MG/5ML IV SOLN
5.0000 mg | Freq: Once | INTRAVENOUS | Status: AC
  Administered 2023-03-06: 5 mg via INTRAVENOUS

## 2023-03-06 MED ORDER — ONDANSETRON HCL 4 MG/2ML IJ SOLN
INTRAMUSCULAR | Status: DC | PRN
  Administered 2023-03-06: 4 mg via INTRAVENOUS

## 2023-03-06 MED ORDER — PROPOFOL 10 MG/ML IV BOLUS
INTRAVENOUS | Status: DC | PRN
  Administered 2023-03-06: 140 mg via INTRAVENOUS

## 2023-03-06 MED ORDER — SODIUM CHLORIDE 0.9 % IV SOLN
12.5000 mg | Freq: Three times a day (TID) | INTRAVENOUS | Status: DC | PRN
  Administered 2023-03-07: 12.5 mg via INTRAVENOUS
  Filled 2023-03-06: qty 0.5

## 2023-03-06 MED ORDER — LACTATED RINGERS IV SOLN: INTRAVENOUS | Status: DC | PRN

## 2023-03-06 MED ORDER — PROMETHAZINE HCL 25 MG/ML IJ SOLN: 6.2500 mg | INTRAMUSCULAR | Status: DC | PRN

## 2023-03-06 MED ORDER — VANCOMYCIN HCL 1000 MG IV SOLR
INTRAVENOUS | Status: DC | PRN
  Administered 2023-03-06: 1000 mg via TOPICAL

## 2023-03-06 MED ORDER — POLYETHYLENE GLYCOL 3350 17 G PO PACK
17.0000 g | PACK | Freq: Two times a day (BID) | ORAL | Status: DC
  Administered 2023-03-06 – 2023-03-07 (×3): 17 g via ORAL
  Filled 2023-03-06 (×7): qty 1

## 2023-03-06 MED ORDER — TRANEXAMIC ACID-NACL 1000-0.7 MG/100ML-% IV SOLN
1000.0000 mg | Freq: Once | INTRAVENOUS | Status: AC
  Administered 2023-03-06: 1000 mg via INTRAVENOUS
  Filled 2023-03-06: qty 100

## 2023-03-06 MED ORDER — ROCURONIUM BROMIDE 100 MG/10ML IV SOLN
INTRAVENOUS | Status: DC | PRN
  Administered 2023-03-06: 20 mg via INTRAVENOUS
  Administered 2023-03-06: 10 mg via INTRAVENOUS
  Administered 2023-03-06: 60 mg via INTRAVENOUS
  Administered 2023-03-06 (×3): 20 mg via INTRAVENOUS

## 2023-03-06 MED ORDER — CHLORHEXIDINE GLUCONATE 0.12 % MT SOLN
OROMUCOSAL | Status: AC
  Administered 2023-03-06: 15 mL
  Filled 2023-03-06: qty 15

## 2023-03-06 MED ORDER — PHENYLEPHRINE HCL-NACL 20-0.9 MG/250ML-% IV SOLN
INTRAVENOUS | Status: DC | PRN
  Administered 2023-03-06 (×3): 160 ug via INTRAVENOUS
  Administered 2023-03-06: 50 ug/min via INTRAVENOUS
  Administered 2023-03-06: 240 ug via INTRAVENOUS

## 2023-03-06 MED ORDER — DULOXETINE HCL 60 MG PO CPEP
60.0000 mg | ORAL_CAPSULE | Freq: Every day | ORAL | Status: DC
  Administered 2023-03-07 – 2023-03-11 (×5): 60 mg via ORAL
  Filled 2023-03-06 (×6): qty 1

## 2023-03-06 MED ORDER — OXYCODONE HCL 5 MG/5ML PO SOLN: 5.0000 mg | Freq: Once | ORAL | Status: AC | PRN

## 2023-03-06 MED ORDER — CEFAZOLIN SODIUM-DEXTROSE 2-4 GM/100ML-% IV SOLN
2.0000 g | Freq: Four times a day (QID) | INTRAVENOUS | Status: AC
  Administered 2023-03-06 – 2023-03-07 (×3): 2 g via INTRAVENOUS
  Filled 2023-03-06 (×3): qty 100

## 2023-03-06 MED ORDER — FENTANYL CITRATE (PF) 250 MCG/5ML IJ SOLN
INTRAMUSCULAR | Status: DC | PRN
  Administered 2023-03-06 (×4): 25 ug via INTRAVENOUS
  Administered 2023-03-06: 100 ug via INTRAVENOUS
  Administered 2023-03-06: 50 ug via INTRAVENOUS

## 2023-03-06 MED ORDER — LIDOCAINE HCL (CARDIAC) PF 100 MG/5ML IV SOSY
PREFILLED_SYRINGE | INTRAVENOUS | Status: DC | PRN
  Administered 2023-03-06: 70 mg via INTRATRACHEAL

## 2023-03-06 MED ORDER — FENTANYL CITRATE (PF) 100 MCG/2ML IJ SOLN
INTRAMUSCULAR | Status: AC
  Filled 2023-03-06: qty 2

## 2023-03-06 MED ORDER — FLUTICASONE PROPIONATE 50 MCG/ACT NA SUSP
2.0000 | Freq: Every day | NASAL | Status: DC | PRN
  Filled 2023-03-06: qty 16

## 2023-03-06 MED ORDER — FENTANYL CITRATE (PF) 100 MCG/2ML IJ SOLN
25.0000 ug | INTRAMUSCULAR | Status: DC | PRN
  Administered 2023-03-06 (×3): 50 ug via INTRAVENOUS

## 2023-03-06 MED ORDER — THROMBIN 20000 UNITS EX KIT
PACK | CUTANEOUS | Status: AC
  Filled 2023-03-06: qty 1

## 2023-03-06 MED ORDER — ADULT MULTIVITAMIN W/MINERALS CH
1.0000 | ORAL_TABLET | Freq: Every day | ORAL | Status: DC
  Administered 2023-03-06 – 2023-03-11 (×6): 1 via ORAL
  Filled 2023-03-06 (×6): qty 1

## 2023-03-06 MED ORDER — LIDOCAINE 2% (20 MG/ML) 5 ML SYRINGE
INTRAMUSCULAR | Status: AC
  Filled 2023-03-06: qty 5

## 2023-03-06 SURGICAL SUPPLY — 123 items
ADH SKN CLS APL DERMABOND .7 (GAUZE/BANDAGES/DRESSINGS) ×1
AGENT HMST KT MTR STRL THRMB (HEMOSTASIS) ×1
APL SKNCLS STERI-STRIP NONHPOA (GAUZE/BANDAGES/DRESSINGS) ×1
APPLIER CLIP 11 MED OPEN (CLIP) ×1
APR CLP MED 11 20 MLT OPN (CLIP) ×1
BAG COUNTER SPONGE SURGICOUNT (BAG) ×2 IMPLANT
BAG SPNG CNTER NS LX DISP (BAG) ×2
BENZOIN TINCTURE PRP APPL 2/3 (GAUZE/BANDAGES/DRESSINGS) IMPLANT
BLADE CLIPPER SURG (BLADE) IMPLANT
BLADE SURG 10 STRL SS (BLADE) ×1 IMPLANT
BONE FIBERS PLIAFX 10 (Bone Implant) ×1 IMPLANT
BUR 14 MATCH 3 (BUR) IMPLANT
BUR EGG ELITE 4.0 (BURR) IMPLANT
BURR 14 MATCH 3 (BUR) ×1
CABLE BIPOLOR RESECTION CORD (MISCELLANEOUS) ×1 IMPLANT
CANISTER SUCT 3000ML PPV (MISCELLANEOUS) ×1 IMPLANT
CLIP APPLIE 11 MED OPEN (CLIP) ×2 IMPLANT
CLIP LIGATING EXTRA MED SLVR (CLIP) IMPLANT
CLSR STERI-STRIP ANTIMIC 1/2X4 (GAUZE/BANDAGES/DRESSINGS) ×2 IMPLANT
COVER MAYO STAND STRL (DRAPES) IMPLANT
COVER SURGICAL LIGHT HANDLE (MISCELLANEOUS) ×1 IMPLANT
COVERAGE SUPPORT O-ARM STEALTH (MISCELLANEOUS) ×1 IMPLANT
DERMABOND ADVANCED .7 DNX12 (GAUZE/BANDAGES/DRESSINGS) IMPLANT
DRAIN CHANNEL 15F RND FF W/TCR (WOUND CARE) IMPLANT
DRAPE C-ARM 42X72 X-RAY (DRAPES) ×2 IMPLANT
DRAPE C-ARMOR (DRAPES) ×1 IMPLANT
DRAPE HALF SHEET 40X57 (DRAPES) IMPLANT
DRAPE POUCH INSTRU U-SHP 10X18 (DRAPES) ×1 IMPLANT
DRAPE SURG 17X23 STRL (DRAPES) ×1 IMPLANT
DRAPE U-SHAPE 47X51 STRL (DRAPES) ×1 IMPLANT
DRSG MEPILEX POST OP 4X12 (GAUZE/BANDAGES/DRESSINGS) IMPLANT
DRSG MEPILEX POST OP 4X8 (GAUZE/BANDAGES/DRESSINGS) IMPLANT
DRSG OPSITE POSTOP 4X8 (GAUZE/BANDAGES/DRESSINGS) ×1 IMPLANT
DURAPREP 26ML APPLICATOR (WOUND CARE) ×1 IMPLANT
ELECT BLADE 4.0 EZ CLEAN MEGAD (MISCELLANEOUS) ×2
ELECT BLADE 6.5 EXT (BLADE) IMPLANT
ELECT CAUTERY BLADE 6.4 (BLADE) ×1 IMPLANT
ELECT PENCIL ROCKER SW 15FT (MISCELLANEOUS) ×1 IMPLANT
ELECT REM PT RETURN 9FT ADLT (ELECTROSURGICAL) ×1
ELECTRODE BLDE 4.0 EZ CLN MEGD (MISCELLANEOUS) ×2 IMPLANT
ELECTRODE REM PT RTRN 9FT ADLT (ELECTROSURGICAL) ×1 IMPLANT
FEE COVERAGE SUPPORT O-ARM (MISCELLANEOUS) IMPLANT
GAUZE 4X4 16PLY ~~LOC~~+RFID DBL (SPONGE) IMPLANT
GLOVE BIO SURGEON STRL SZ 6.5 (GLOVE) ×1 IMPLANT
GLOVE BIO SURGEON STRL SZ7.5 (GLOVE) ×1 IMPLANT
GLOVE BIOGEL PI IND STRL 6.5 (GLOVE) ×1 IMPLANT
GLOVE BIOGEL PI IND STRL 8 (GLOVE) ×1 IMPLANT
GLOVE BIOGEL PI IND STRL 8.5 (GLOVE) ×2 IMPLANT
GLOVE INDICATOR 7.5 STRL GRN (GLOVE) IMPLANT
GLOVE SURG PR MICRO ENCORE 7.5 (GLOVE) IMPLANT
GOWN SRG XL XLNG 56XLVL 4 (GOWN DISPOSABLE) IMPLANT
GOWN STRL NON-REIN XL XLG LVL4 (GOWN DISPOSABLE) ×3
GOWN STRL REUS W/ TWL LRG LVL3 (GOWN DISPOSABLE) ×1 IMPLANT
GOWN STRL REUS W/ TWL XL LVL3 (GOWN DISPOSABLE) ×1 IMPLANT
GOWN STRL REUS W/TWL 2XL LVL3 (GOWN DISPOSABLE) ×3 IMPLANT
GOWN STRL REUS W/TWL LRG LVL3 (GOWN DISPOSABLE) ×1
GOWN STRL REUS W/TWL XL LVL3 (GOWN DISPOSABLE) ×1
GRAFT BNE FBR PLIAFX PRIME 10 (Bone Implant) IMPLANT
HEMOSTAT SNOW SURGICEL 2X4 (HEMOSTASIS) IMPLANT
INSERT FOGARTY 61MM (MISCELLANEOUS) IMPLANT
INSERT FOGARTY SM (MISCELLANEOUS) IMPLANT
KIT BASIN OR (CUSTOM PROCEDURE TRAY) ×1 IMPLANT
KIT POSITION SURG JACKSON T1 (MISCELLANEOUS) IMPLANT
KIT TURNOVER KIT B (KITS) ×1 IMPLANT
NDL 22X1.5 STRL (OR ONLY) (MISCELLANEOUS) ×1 IMPLANT
NDL SPNL 18GX3.5 QUINCKE PK (NEEDLE) ×2 IMPLANT
NEEDLE 22X1.5 STRL (OR ONLY) (MISCELLANEOUS) IMPLANT
NEEDLE SPNL 18GX3.5 QUINCKE PK (NEEDLE) IMPLANT
NS IRRIG 1000ML POUR BTL (IV SOLUTION) ×1 IMPLANT
PACK LAMINECTOMY ORTHO (CUSTOM PROCEDURE TRAY) ×1 IMPLANT
PACK UNIVERSAL I (CUSTOM PROCEDURE TRAY) ×1 IMPLANT
PAD ARMBOARD 7.5X6 YLW CONV (MISCELLANEOUS) ×4 IMPLANT
PATTIES SURGICAL .5 X.5 (GAUZE/BANDAGES/DRESSINGS) IMPLANT
PATTIES SURGICAL .5 X1 (DISPOSABLE) ×1 IMPLANT
PENCIL BUTTON HOLSTER BLD 10FT (ELECTRODE) IMPLANT
PIN BONE FIX 100 (PIN) IMPLANT
POSITIONER HEAD PRONE TRACH (MISCELLANEOUS) ×1 IMPLANT
ROD RELINE-O LORD 5.5X35MM (Rod) IMPLANT
ROD SPINAL THRD DISP (MISCELLANEOUS) IMPLANT
SCREW ANT CERV SD IND 5.5X25 (Screw) IMPLANT
SCREW LOCK RELINE 5.5 TULIP (Screw) IMPLANT
SCREW RELINE-O POLY 6.5X45 (Screw) IMPLANT
SPACER HEDRON IA 26X34X11 8D (Spacer) IMPLANT
SPONGE INTESTINAL PEANUT (DISPOSABLE) ×4 IMPLANT
SPONGE SURGIFOAM ABS GEL 100 (HEMOSTASIS) ×1 IMPLANT
SPONGE T-LAP 18X18 ~~LOC~~+RFID (SPONGE) ×1 IMPLANT
SPONGE T-LAP 4X18 ~~LOC~~+RFID (SPONGE) ×2 IMPLANT
STAPLER VISISTAT 35W (STAPLE) ×1 IMPLANT
SURGIFLO W/THROMBIN 8M KIT (HEMOSTASIS) ×1 IMPLANT
SUT BONE WAX W31G (SUTURE) ×1 IMPLANT
SUT MNCRL AB 3-0 PS2 18 (SUTURE) IMPLANT
SUT MNCRL AB 3-0 PS2 27 (SUTURE) ×2 IMPLANT
SUT MNCRL+ AB 3-0 CT1 36 (SUTURE) ×1 IMPLANT
SUT MONOCRYL AB 3-0 CT1 36IN (SUTURE) ×1
SUT PDS AB 1 CTX 36 (SUTURE) ×1 IMPLANT
SUT PROLENE 4 0 RB 1 (SUTURE)
SUT PROLENE 4-0 RB1 .5 CRCL 36 (SUTURE) IMPLANT
SUT PROLENE 5 0 C 1 24 (SUTURE) ×1 IMPLANT
SUT PROLENE 5 0 CC1 (SUTURE) IMPLANT
SUT PROLENE 6 0 C 1 30 (SUTURE) IMPLANT
SUT PROLENE 6 0 CC (SUTURE) IMPLANT
SUT SILK 0 TIES 10X30 (SUTURE) IMPLANT
SUT SILK 2 0 TIES 10X30 (SUTURE) ×2 IMPLANT
SUT SILK 2 0SH CR/8 30 (SUTURE) IMPLANT
SUT SILK 3 0 TIES 10X30 (SUTURE) ×1 IMPLANT
SUT SILK 3 0 TIES 17X18 (SUTURE)
SUT SILK 3 0SH CR/8 30 (SUTURE) IMPLANT
SUT SILK 3-0 18XBRD TIE BLK (SUTURE) IMPLANT
SUT VIC AB 0 CT1 18XCR BRD8 (SUTURE) IMPLANT
SUT VIC AB 0 CT1 8-18 (SUTURE) ×1
SUT VIC AB 1 CT1 18XCR BRD 8 (SUTURE) ×1 IMPLANT
SUT VIC AB 1 CT1 27 (SUTURE) ×2
SUT VIC AB 1 CT1 27XBRD ANBCTR (SUTURE) ×2 IMPLANT
SUT VIC AB 1 CT1 8-18 (SUTURE) ×1
SUT VIC AB 2-0 CT1 18 (SUTURE) ×1 IMPLANT
SYR BULB IRRIG 60ML STRL (SYRINGE) ×1 IMPLANT
SYR CONTROL 10ML LL (SYRINGE) ×1 IMPLANT
TOWEL GREEN STERILE (TOWEL DISPOSABLE) ×2 IMPLANT
TOWEL GREEN STERILE FF (TOWEL DISPOSABLE) ×1 IMPLANT
TRAY FOLEY MTR SLVR 16FR STAT (SET/KITS/TRAYS/PACK) ×1 IMPLANT
TUBING BULK SUCTION (MISCELLANEOUS) IMPLANT
WATER STERILE IRR 1000ML POUR (IV SOLUTION) ×1 IMPLANT
YANKAUER SUCT BULB TIP NO VENT (SUCTIONS) ×1 IMPLANT

## 2023-03-06 NOTE — Plan of Care (Signed)

## 2023-03-06 NOTE — Brief Op Note (Signed)
03/06/2023  12:56 PM  PATIENT:  Harold Smith  57 y.o. male  PRE-OPERATIVE DIAGNOSIS:  LUMBAR RADICULOPATHY  POST-OPERATIVE DIAGNOSIS:  LUMBAR RADICULOPATHY  PROCEDURE:  Procedure(s): L5-S1 ANTERIOR LUMBAR FUSION 1 LEVEL (N/A) L5-S1 POSTERIOR INSTRUMENTED LUMBAR FUSION 1 LEVEL (N/A) APPLICATION OF CELL SAVER (N/A) ABDOMINAL EXPOSURE (N/A)  SURGEON:  Surgeon(s) and Role: Panel 1:    Callie Fielding, MD - Primary    * Marty Heck, MD Panel 2:    Marty Heck, MD - Primary  PHYSICIAN ASSISTANT:   ASSISTANTS: none   ANESTHESIA:   general  EBL:  200 mL   BLOOD ADMINISTERED: 50cc cell saver  DRAINS: none   LOCAL MEDICATIONS USED:  NONE  SPECIMEN:  No Specimen  DISPOSITION OF SPECIMEN:  N/A  COUNTS:  YES  TOURNIQUET:  NONE  DICTATION: .Note written in EPIC  PLAN OF CARE: Admit to inpatient   PATIENT DISPOSITION:  PACU - hemodynamically stable.   Delay start of Pharmacological VTE agent (>24hrs) due to surgical blood loss or risk of bleeding: yes

## 2023-03-06 NOTE — Anesthesia Procedure Notes (Signed)
Arterial Line Insertion Start/End3/10/2023 8:07 AM, 03/06/2023 8:12 AM Performed by: Audry Pili, MD, anesthesiologist  Patient location: Pre-op. Preanesthetic checklist: patient identified, IV checked, risks and benefits discussed, surgical consent, monitors and equipment checked, pre-op evaluation, timeout performed and anesthesia consent Patient sedated Left, radial was placed Catheter size: 20 G Hand hygiene performed   Attempts: 3 (Previous attempts by CRNA on right radial artery unsuccessful) Procedure performed using ultrasound guided technique. Ultrasound Notes:anatomy identified, needle tip was noted to be adjacent to the nerve/plexus identified, no ultrasound evidence of intravascular and/or intraneural injection and image(s) printed for medical record Following insertion, dressing applied and Biopatch. Post procedure assessment: unchanged and normal  Post procedure complications: unsuccessful attempts, local hematoma and second provider assisted. Patient tolerated the procedure well with no immediate complications.

## 2023-03-06 NOTE — Op Note (Signed)
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 male who presented to the office with symptoms consistent with lumbar radiculopathy. The patient had tried conservative treatments that did not provide any lasting relief. As result, operative management was discussed. The pre-operative MRI showed bilateral foraminal stenosis at L5/S1 with an isthmic spondylolisthesis. As a part of the treatment L5/S1 posterior instrumented spinal fusion was discussed. The risks including but not limited to adjacent segment disease, dural tear, nerve root injury, paralysis, persistent pain, infection, bleeding, heart attack, death, stroke, fracture, and need for additional procedures were discussed with the patient. The benefit of the surgery would be relief of the patient's symptoms bilateral leg pain. I informed him that he may get some relief of his back pain but this was not predictable. The alternatives to surgical management were covered with the patient and included continued monitoring, physical therapy, over-the-counter pain medications, ambulatory aids, and activity modification. All the patient's questions were  answered to his satisfaction. After this discussion, the patient expressed understanding and elected to proceed with surgical intervention.   Procedure Description: At this point, the patient was in the operating room and the drapes had been taken down from the anterior portion. The patient was moved from the supine jackson table to the prone jaDicksonable. All bony prominences were well padded. His eyes were free of compression and the head of the bed was slightly elevated. His hair over the lumbar region was shaved. Alcohol was used to sterilize the area. Once the alcohol dried, the area was then prepped with DuraPrep. The patient's lumbar back was draped in a standard, sterile fashion. A time out was performed that confirmed the patient identity, the planned procedure, and the operative levels. All team members were in agreement.   A midline incision over the spinous processes was made based off of anatomical landmarks. Incision was taken sharply down through skin and dermis. Electrocautery was then used to continue the midline dissection down to the level of the spinous process. Subperiosteal dissection was performed using electrocautery to expose the lamina out lateral to the facet joint capsule. Care was taken to not violate the facet joint capsules. A lateral fluoroscopic image was taken to confirm the level. Subperiosteal dissection with electrocautery was then done to expose the lamina L5 and S1. The facet capsules at L4/5 and L5/S1 were identified bilaterally. The L4/5 capsules were preserved while the L5/S1 capsules were removed with electrocautery.   A percutaneous pelvic pin was then placed at the right PSIS. Once it was in position with god bony purchase, the reference array was attached. The O arm was brought in and images were obtained. They showed an isthmic spondylolisthesis and confirmed that the L5/S1 interbody device was contained within the disc space. Next, a navigated drill bit was used  to make a pilot hole at S1 on the left.  A navigated awl-tap was advanced along the trajectory of the pedicle. The estimated length of the screw was 79m. A 6.5x483mscrew was then advanced using navigation and a navigated screwdriver. Good purchase was obtained. The same process was used to place bilateral L5 screws and the right S1 screw. The navigated burr was then used to decorticate the L5/S1 facets bilaterally and the L5 lamina and top of the S1 lamina. Demineralized bone matrix was applied over the decorticated bone. Fluoroscopy was brought in and showed stable position of the interbody and satisfactory position of the posterior instrumentation.   1g of vancomycin powder was placed into the wound. The fascia was reapproximated with 0 vicryl suture. The subcutaneous fat was reapproximated with 0 vicryl suture. The deep dermal layer was reapproximated with 2-0 viryl. The skin as closed with a 3-0 running moncryl. All counts were correct at the end of the case. The incision was dressed with steri strips and benzoine. An island dressing was placed over the wound. The patient was transferred back to a bed and brought to the post-anesthesia care unit by anesthesia staff in stable condition.  Post-operative plan: The patient will recover in the post-anesthesia care unit and then go to the floor. The patient will receive two post-operative doses of ancef. The patient will be out of bed as tolerated with no brace. The patient will work with physical therapy. The patient's disposition will be determined based off how he does on the floor post-operatively.       MiIleene RubensMD Orthopedic Surgeon

## 2023-03-06 NOTE — Progress Notes (Signed)
Pt. Arrived to unit alert and oriented no c/o pain family at bedside. Bed in lowest position call bell within reach

## 2023-03-06 NOTE — Op Note (Addendum)
Orthopedic Spine Surgery Operative Report  Procedure: L5/S1 anterior lumbar interbody arthrodesis  Insertion of biomechanical device with integrated anterior fixation (screws)  Application of demineralized bone matrix  Modifier: 62  Date of procedure: 03/06/2023  Patient name: Harold Smith MRN: [id number] DOB: 07/16/1966  Surgeon: Ileene Rubens, MD Co-surgeon: Monica Martinez, MD Pre-operative diagnosis: L5/S1 isthmic spondylolisthesis, lumbar radiculopathy Post-operative diagnosis: same as above Findings: L5/S1 degenerative disc with height loss, pars defects at L5/S1  Specimens: none Anesthesia: general EBL: [id number] Complications: none Pre-incision antibiotic: ancef TXA was also given prior to incision  Implants:  Globus Hedron ALIF cage (11x26x34, 8 degree) Globus integrated anterior fixation 106m screws (x3)   Indication for procedure: Patient is a 57y.o. male who presented to the office with symptoms consistent with lumbar radiculopathy. The patient had tried conservative treatments that did not provide any lasting relief. As result, operative management was discussed. 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 In order to address the symptoms, anterior lumbar interbody arthrodesis at L5/S1 was discussed as a part of one the treatment option. The risks including but not limited to dural tear, nerve root injury, paralysis, persistent pain, infection, bleeding, hardware failure, adjacent segment disease, vascular injury, bowel injury, retrograde ejaculation, heart attack, death, stroke, fracture, and need for additional procedures were discussed with the patient. Since this will be an indirect decompression, a risk of persistent symptoms was covered with the patient which the patient understood after explaining the reason. The benefit of the surgery would be relief of  the patient's bilateral radiating leg pain. I explained that back pain is not reliably relieved with surgery. The alternatives to surgical management were covered with the patient and included continued monitoring, physical therapy, over-the-counter pain medications, ambulatory aids, and activity modification. All the patient's questions were answered to his satisfaction. After this discussion, the patient expressed understanding and elected to proceed with surgical intervention.    Procedure Description: The patient was met in the pre-operative holding area. The patient's identity and consent were verified. The operative site was marked. The patient's remaining questions about the surgery were answered. The patient was brought back to the operating room. General anesthesia was induced and an endotracheal tube was placed by the anesthesia staff. The patient was transferred to the [place] in the supine position. A padded bump was placed beneath the cranial sacrum. All bony prominences were well padded. An electric razor was used to remove his hair over the lower abdominal wall and superior pubic region. The surgical area was cleansed with alcohol. Fluoroscopy was then brought in to check rotation on the AP image and to mark the levels on the lateral image. The patient's skin was then prepped and draped in a standard, sterile fashion. A time out was performed that identified the patient, the procedure, and the operative levels. All team members agreed with what was stated in the time out. Dr. CCarlis Abbott(approach surgeon) took over at this time to provide exposure to the anterior lumbar spine (dictated separately).   Once adequate exposure was obtained and the vessels has been adequately retracted away from the disc space, a bayonetted needle was placed into the disc space. An AP image was obtained to determine the needle's position relative to midline within the disc space. A lateral fluoroscopic image was then  obtained to verify that the correct level had been exposed. Electrocautery was used to mark the midline  of the disc. The needle was removed and a trial interbody was then centered over the midline mark. The lateral edges of the interbody device were marked with electrocautery. A long handle knife was used to incise the disc along the L5 and S1 endplates. A vertical incision in the disc was made where the lateral edges of the interbody had been marked to complete the rectangular incision in the disc. Next, a series of forward angle and straight curettes were used to remove disc and clear the cartilaginous material off the endplates. A pituitary was used to remove the tissue removed from the endoplates. Periodic lateral fluoroscopic images were taken to confirm that the curettes did not go posterior to the vertebral bodies. Given that the plan was for an indirect decompression, an 8 degree lordotic cage was selected to maximize the posterior height obtained. And since he had significant disc height loss, plan was for an 73m height cage. A trial of 8 degree, 169mheight medium footprint cage was inserted into the disc space. A good fit was obtained. This was selected as the final interbody device which was then packed with demineralized bone matrix on the back table. The final interbody device was then inserted into the interspace and impacted into place under lateral fluoroscopic imaging. An awl was used through the cage to create the trajectory for the integrated screws in both the superior and inferior endplates. 2567mcrews were then inserted into these holes within the cage. A total of 3 55m77mrews were inserted through the cage into the L5 and S1 vertebral bodies. A final lateral and AP image were taken showing the appropriate depth to the cage and the cage contained within the disc space but off the left side.    There was no active bleeding within the wound. The retractor blades were removed. The anterior  abdominal fascia was closed with 0 vicryl. The deep dermal layer was closed with 2-0 vicryl. The skin was closed with 3-0 monocryl. Dermabond was applied over the skin. A mepilex dressing was used over the wound.   Post-operative plan: The patient will remain intubated and in the operating room. The drapes will be taken down and the patient repositioned in the prone position. The posterior portion of the case will be completed next in the same trip to the operating room and dictated separately.       MichIleene Rubens Orthopedic Surgeon

## 2023-03-06 NOTE — Op Note (Signed)
Date: March 06, 2023  Preoperative diagnosis: Chronic lower back pain with bilateral lower extremity radiculopathy   Postoperative diagnosis: Same  Procedure: Anterior spine exposure via anterior retroperitoneal approach for L5-S1 ALIF  Surgeon: Dr. Marty Heck, MD  Co-surgeon: Dr. Ileene Rubens, MD  Indications: 57 year old male with chronic lower back pain that has been evaluated by [name].  He has recommended an anterior L5-S1 ALIF and asked vascular surgery to assist with anterior spine exposure.  He presents after risks benefits discussed.  Findings: The L5-S1 disc space was marked over the left rectus muscle with a fluoroscopic C arm in the lateral position.  Transverse incision was made here and we opened the anterior rectus sheath mobilized left rectus muscle to the midline and then entered into the retroperitoneum and mobilized peritoneum and left ureter to the midline.  Middle sacral vessels were divided between clips and ties and divided.  I fully mobilized the left iliac vein lateral to the disc space.  Fixed NuVasive retractor was placed.  Confirmed we were at the correct level with a needle in the disc space on lateral fluroscopy.  Anesthesia: General  Details: Patient was taken to the operating room after informed consent was obtained.  Placed on operative table in the supine position.  General endotracheal anesthesia was induced.  Fluoroscopic C-arm was then used in the lateral position.  The L5-S1 disc space was marked over the left rectus muscle.  The abdominal wall was then prepped and draped in standard sterile fashion.  Antibiotics were given.  Timeout performed.  Initially made our incision over the preoperative mark over the left rectus muscle in transverse fashion.  Dissected with Bovie cautery through the subcutaneous tissue and used cerebellum retractors.  Opened the anterior rectus sheath transversely with Bovie cautery.  Raised flaps underneath the anterior  rectus sheath with Bovie cautery.  The left rectus muscle was mobilized to the midline.  I then bluntly mobilized peritoneum and left ureter across midline to visualize the disc space.  The middle sacral vessels were divided between clips.  There was an additional branch off the left iliac vein into the pelvis that was ligated between 2-0 silk ties and divided.  I then fully mobilized on both sides of the disc space with suction and Kd till we had good working room.  Fixed NuVasive retractor was placed.  140 reverse lips were placed on each side of the disc space with a 120 reverse lip cranial and caudal.  We had good exposure of the disc space anteriorly.  Placed a needle in the disc space and confirmed we were at the correct level on lateral fluoroscopy.  Case was turned over to [name].  Please see his dictation for the remainder of the case.  Complication: None  Condition: Stable  Marty Heck, MD [facility] Office: [place]

## 2023-03-06 NOTE — Progress Notes (Signed)
Orthopedic Surgery Post-operative Progress Note  Assessment: Patient is a 57 y.o. male who is currently admitted after undergoing L5/S1 ALIF and PSIF   Plan: -Operative plans complete -Needs upright films when able -Drains: none -Out of bed as tolerated, no brace -No bending/lifting/twisting greater than 10 pounds -PT evaluate and treat -Pain control -Regular diet -No chemoprophylaxis for dvt or antiplatelets for 72 hours after surgery -Ancef x2 post-operative doses -Disposition: remain floor status  ___________________________________________________________________________   Subjective: No acute events since surgery. Has moved from PACU to the floor. Having a lot of back pain. No leg pain. Denies paresthesias and numbness.   Objective:  General: no acute distress, appropriate affect Neurologic: alert, answering questions appropriately, following commands Respiratory: unlabored breathing on room air Skin: dressing clear/dry/intact  MSK (spine):  -Strength exam      Right  Left  EHL    5/5  5/5 TA    5/5  5/5 GSC    5/5  5/5 Knee extension  5/5  5/5 Hip flexion   5/5  5/5  -Sensory exam    Sensation intact to light touch in L3-S1 nerve distributions of bilateral lower extremities   Patient name: Harold Smith Patient MRN: [id number] Date: 03/06/23

## 2023-03-06 NOTE — Progress Notes (Signed)
20 mg esmolol given per Dr. Marcie Bal
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
## 2023-03-06 NOTE — H&P (Signed)
Orthopedic Spine Surgery H&P Note  Assessment: Patient is a 57 y.o. male with L5/S1 isthmic spondylolisthesis and foraminal stenosis, lumbar radiculopathy   Plan: -Out of bed as tolerated, activity as tolerated, no brace -Covered the risks of surgery one more time with the patient and patient elected to proceed with planned surgery -Written consent verified -Hold anticoagulation in anticipation of surgery -Ancef on all to OR -NPO for procedure -Site marked -To OR when ready  The patient has symptoms consistent with lumbar radiculopathy. The patient's symptoms were not getting improvement with conservative treatment so operative management was discussed in the form of L5/S1 ALIF and PSIF. The risks including but not limited to dural tear, nerve root injury, paralysis, persistent pain, infection, bleeding, pseudarthrosis, hardware failure/malposition, adjacent segment disease, cage subsidence, vascular injury, bowel injury, retrograde ejaculation, heart attack, death, stroke, fracture, and need for additional procedures were discussed with the patient. I explained that his risk of hardware complication is higher since he does have osteoporosis. He has taken over 2 months of fosamax to minimize that risk. Since this will be an indirect decompression, there is a risk of persistent symptoms which the patient understood after explaining the reason. The benefit of the surgery would be improvement in the patient's bilateral leg pain. I explained that back pain relief is not the main goal of the surgery but he may get some relief because he does have an isthmic spondylolisthesis. I told him that this is not as reliably relieved as the leg pain. The alternatives to surgical management were covered with the patient and included continued monitoring, physical therapy, over-the-counter pain medications, ambulatory aids, injections, and activity modification. All the patient's questions were answered to his  satisfaction. After this discussion, the patient expressed understanding and elected to proceed with surgical intervention.     ___________________________________________________________________________  Chief Complaint: low back pain radiating into posterior legs  History: Patient is 57 y.o. male who has been previously seen in the office for low back pain that radiates into his bilateral posterior legs. Work up revealed L5/S1 foraminal stenosis with an isthmic spondylolisthesis. These symptoms failed to improve with conservative treatment so operative management in the form of L5/S1 ALIF and PSIF was discussed at the last office visit. The patient presents today with no changes in his symptoms since the last office visit. See previous office note for further details.   Of note, he has osteoporosis and underwent over 2 months of bisphosphonate treatment prior to this date.    Review of systems: General: denies fevers and chills, myalgias Neurologic: denies recent changes in vision, slurred speech Abdomen: denies nausea, vomiting, hematemesis Respiratory: denies cough, shortness of breath  Past medical history: Chronic pain Depression Anxiety GERD Irritable bowel syndrome Diverticulitis Sleep apnea Osteoporosis   Allergies: NKDA   Past surgical history:  Right femur fracture ORIF L1 kyphoplasty Right Achilles tendon repair Cataract surgery Cholecystectomy Colon resection Gastric bypass Hiatal hernia repair Polypectomy   Social history: Denies use of nicotine product (smoking, vaping, patches, smokeless) Alcohol use: Yes, 1 every other week Denies recreational drug use  Family history: -reviewed and not pertinent to lumbar radiculopathy   Physical Exam:  BMI of 22.4  General: no acute distress, appears stated age Neurologic: alert, answering questions appropriately, following commands Cardiovascular: regular rate, no cyanosis Respiratory: unlabored breathing on  room air, symmetric chest rise Psychiatric: appropriate affect, normal cadence to speech   MSK (spine):  -Strength exam      Left  Right  EHL  5/5  5/5 TA    5/5  5/5 GSC    5/5  5/5 Knee extension  5/5  5/5 Knee flexion   5/5  5/5 Hip flexion   5/5  5/5  -Sensory exam    Sensation intact to light touch in L3-S1 nerve distributions of bilateral lower extremities   Patient name: Harold Smith Patient MRN: [id number] Date: 03/06/23

## 2023-03-06 NOTE — Progress Notes (Signed)
Orthopedic Surgery Post-operative Progress Note  Assessment: Patient is a 57 y.o. male who is currently admitted after undergoing L5/S1 ALIF and PSIF   Plan: -Operative plans complete -Needs upright films when able -Drains: none -Out of bed as tolerated, no brace -No bending/lifting/twisting greater than 10 pounds -PT evaluate and treat -Pain control -Regular diet -No chemoprophylaxis for dvt or antiplatelets for 72 hours after surgery -Ancef x2 post-operative doses -Disposition: to floor from PACU  ___________________________________________________________________________   Subjective: No acute events since surgery. Recovering in PACU. Having a lot of pain that PACU is working on controlling.   Objective:  General: no acute distress, appropriate affect Neurologic: alert, answering questions appropriately, following commands Respiratory: unlabored breathing on room air Skin: dressing clear/dry/intact  MSK (spine):  -Strength exam      Right  Left  EHL    5/5  5/5 TA    5/5  5/5 GSC    5/5  5/5 Knee extension  5/5  5/5 Hip flexion   5/5  5/5  -Sensory exam    Sensation intact to light touch in L3-S1 nerve distributions of bilateral lower extremities   Patient name: Harold Smith Patient MRN: [id number] Date: 03/06/23

## 2023-03-06 NOTE — Progress Notes (Signed)
Pre-operative Scores  ODI: 37 VAS back: 8 VAS leg: 8 SF-36:  -Physical functioning: 5  -Role limitations due to physical health: 0  -Role limitations due to emotional problems: 0  -Energy/fatigue: 15  -Emotional well-being: 32  -Social functioning: 25  -Pain: 22.5  -General health: [place], MD Orthopedic Surgeon

## 2023-03-06 NOTE — Transfer of Care (Signed)
Immediate Anesthesia Transfer of Care Note  Patient: Dewy Kolodziej  Procedure(s) Performed: L5-S1 ANTERIOR LUMBAR FUSION 1 LEVEL L5-S1 POSTERIOR INSTRUMENTED LUMBAR FUSION 1 LEVEL APPLICATION OF CELL SAVER ABDOMINAL EXPOSURE  Patient Location: PACU  Anesthesia Type:General  Level of Consciousness: awake and patient cooperative  Airway & Oxygen Therapy: Patient Spontanous Breathing and Patient connected to face mask oxygen  Post-op Assessment: Report given to RN, Post -op Vital signs reviewed and stable, and Patient moving all extremities  Post vital signs: Reviewed and stable  Last Vitals:  Vitals Value Taken Time  BP 147/98 03/06/23 1318  Temp    Pulse 97 03/06/23 1324  Resp 15 03/06/23 1324  SpO2 92 % 03/06/23 1324  Vitals shown include unvalidated device data.  Last Pain:  Vitals:   03/06/23 0703  TempSrc:   PainSc: 9          Complications: No notable events documented.

## 2023-03-07 LAB — CBC
HCT: 34 % — ABNORMAL LOW (ref 39.0–52.0)
Hemoglobin: 11.5 g/dL — ABNORMAL LOW (ref 13.0–17.0)
MCH: 32.7 pg (ref 26.0–34.0)
MCHC: 33.8 g/dL (ref 30.0–36.0)
MCV: 96.6 fL (ref 80.0–100.0)
Platelets: 247 10*3/uL (ref 150–400)
RBC: 3.52 MIL/uL — ABNORMAL LOW (ref 4.22–5.81)
RDW: 13.6 % (ref 11.5–15.5)
WBC: 12.6 10*3/uL — ABNORMAL HIGH (ref 4.0–10.5)
nRBC: 0 % (ref 0.0–0.2)

## 2023-03-07 LAB — BASIC METABOLIC PANEL
Anion gap: 6 (ref 5–15)
BUN: 10 mg/dL (ref 6–20)
CO2: 25 mmol/L (ref 22–32)
Calcium: 7.9 mg/dL — ABNORMAL LOW (ref 8.9–10.3)
Chloride: 102 mmol/L (ref 98–111)
Creatinine, Ser: 0.82 mg/dL (ref 0.61–1.24)
GFR, Estimated: >60 mL/min (ref >60)
Glucose, Bld: 136 mg/dL — ABNORMAL HIGH (ref 70–99)
Potassium: 3.8 mmol/L (ref 3.5–5.1)
Sodium: 133 mmol/L — ABNORMAL LOW (ref 135–145)

## 2023-03-07 MED ORDER — SODIUM CHLORIDE 0.9 % IV SOLN: INTRAVENOUS | Status: AC

## 2023-03-07 MED ORDER — SODIUM CHLORIDE 0.9 % IV SOLN: INTRAVENOUS | Status: DC

## 2023-03-07 NOTE — Evaluation (Signed)
Occupational Therapy Evaluation Patient Details Name: Harold Smith MRN: [id number] DOB: 1966-01-24 Today's Date: 03/07/2023   History of Present Illness 57 yo s/p L5/S1 ALIF/ PLIF. S/p ORIF R distal femur fx on 9/5. PMH: gastric bypass, sleep apnea; anxiety, HTN, metabolic bone disease, achilles tendon repair.   Clinical Impression   PTA pt lives alone independently. Making excellent progress today, able to ambulate @ a modified independent level around the entire unite without an AD and do 2 steps. Requires min A with LB ADL. Began education regarding compensatory strategies for ADL and use of AE/DME to maximize independence and reduce risk of alls. Pt states he will have necessary level of assistance to safely DC home. Pt very appreciative. Will follow however no OT needed after DC.      Recommendations for follow up therapy are one component of a multi-disciplinary discharge planning process, led by the attending physician.  Recommendations may be updated based on patient status, additional functional criteria and insurance authorization.   Follow Up Recommendations  No OT follow up     Assistance Recommended at Discharge Intermittent Supervision/Assistance  Patient can return home with the following Assistance with cooking/housework;A little help with bathing/dressing/bathroom;Assist for transportation    Functional Status Assessment  Patient has had a recent decline in their functional status and demonstrates the ability to make significant improvements in function in a reasonable and predictable amount of time.  Equipment Recommendations  None recommended by OT    Recommendations for Other Services       Precautions / Restrictions Precautions Precautions: Back Precaution Booklet Issued: Yes (comment) Required Braces or Orthoses:  (no brace needed) Restrictions Weight Bearing Restrictions: No      Mobility Bed Mobility Overal bed mobility: Needs Assistance              General bed mobility comments: good use of back precuations/log rolling after instruction    Transfers Overall transfer level: Needs assistance                 General transfer comment: initially S, progressed to mod I      Balance Overall balance assessment: No apparent balance deficits (not formally assessed)                                         ADL either performed or assessed with clinical judgement   ADL Overall ADL's : Needs assistance/impaired     Grooming: Set up;Supervision/safety;Standing   Upper Body Bathing: Set up;Supervision/ safety;Sitting   Lower Body Bathing: Minimal assistance;Sit to/from stand   Upper Body Dressing : Set up;Supervision/safety;Sitting   Lower Body Dressing: Minimal assistance;Sit to/from stand   Toilet Transfer: Supervision/safety   Toileting- Water quality scientist and Hygiene: Min guard       Functional mobility during ADLs: Modified independent       Vision Baseline Vision/History: 1 Wears glasses (readers)       Perception     Praxis      Pertinent Vitals/Pain Pain Assessment Pain Assessment: 0-10 Pain Score: 8  Pain Location: back/abdomen Pain Descriptors / Indicators: Discomfort, Grimacing, Operative site guarding Pain Intervention(s): Limited activity within patient's tolerance, Premedicated before session, Patient requesting pain meds-RN notified, RN gave pain meds during session     Hand Dominance Right   Extremity/Trunk Assessment Upper Extremity Assessment Upper Extremity Assessment: Overall WFL for tasks assessed   Lower Extremity Assessment  Lower Extremity Assessment: Overall WFL for tasks assessed   Cervical / Trunk Assessment Cervical / Trunk Assessment: Back Surgery (previous kyphoplasty)   Communication Communication Communication: No difficulties   Cognition Arousal/Alertness: Awake/alert Behavior During Therapy: WFL for tasks assessed/performed Overall  Cognitive Status: Within Functional Limits for tasks assessed                                       General Comments       Exercises     Shoulder Instructions      Home Living Family/patient expects to be discharged to:: Private residence Living Arrangements: Alone Available Help at Discharge: Family;Available 24 hours/day Type of Home: House Home Access: Stairs to enter [employer] of Steps: 1 + threshold through front door, one step through garage entrance   Home Layout: Two level;Able to live on main level with bedroom/bathroom     Bathroom Shower/Tub: Teacher, early years/pre: Standard (has BSC over toilet) Bathroom Accessibility: Yes How Accessible: Accessible via walker Home Equipment: Norphlet - single point;BSC/3in1;Rolling Walker (2 wheels);Shower seat (still has Tiki bench)          Prior Functioning/Environment Prior Level of Function : Independent/Modified Independent;Driving             Mobility Comments: using cane time to time          OT Problem List: Decreased knowledge of use of DME or AE;Decreased safety awareness;Pain      OT Treatment/Interventions: Self-care/ADL training;DME and/or AE instruction;Patient/family education    OT Goals(Current goals can be found in the care plan section) Acute Rehab OT Goals Patient Stated Goal: to get better OT Goal Formulation: With patient Time For Goal Achievement: 03/21/23 Potential to Achieve Goals: Good  OT Frequency: Min 2X/week    Co-evaluation              AM-PAC OT "6 Clicks" Daily Activity     Outcome Measure Help from another person eating meals?: None Help from another person taking care of personal grooming?: A Little Help from another person toileting, which includes using toliet, bedpan, or urinal?: A Little Help from another person bathing (including washing, rinsing, drying)?: A Little Help from another person to put on and taking off regular  upper body clothing?: A Little Help from another person to put on and taking off regular lower body clothing?: A Little 6 Click Score: 19   End of Session Equipment Utilized During Treatment: Gait belt Nurse Communication: Mobility status;Patient requests pain meds  Activity Tolerance: Patient tolerated treatment well Patient left: in chair;with call bell/phone within reach  OT Visit Diagnosis: Pain Pain - part of body:  (abdomen/back)                Time: [id number] OT Time Calculation (min): 36 min Charges:  OT General Charges $OT Visit: 1 Visit OT Evaluation $OT Eval Moderate Complexity: 1 Mod OT Treatments $Self Care/Home Management : 8-22 mins  Maurie Boettcher, OT/L   Acute OT Clinical Specialist Acute Rehabilitation Services Pager [phone] Office [phone]   [facility] 03/07/2023, 10:17 AM

## 2023-03-07 NOTE — Progress Notes (Signed)
Orthopedic Surgery Post-operative Progress Note  Assessment: Patient is a 57 y.o. male who is currently admitted after undergoing L5/S1 ALIF and PSIF   Plan: -Operative plans complete -Drains: none -Out of bed as tolerated, no brace -No bending/lifting/twisting greater than 10 pounds -PT evaluate and treat -Pain control -Regular diet -No chemoprophylaxis for dvt or antiplatelets for 72 hours after surgery -Ancef x2 post-operative doses -Disposition: remain floor status  Depression/Anxiety -continue home duloxetine  HTN -continue home HCTZ and lisinopril   GERD -continue PPI treatment  Osteoporosis -resume fosamax on discharge -continue home calcium and vitamin D  ___________________________________________________________________________   Subjective: No acute events since seen this morning. Walked the halls with PT. Did not have any leg pain. Back pain is slowly getting better. Had a BM earlier today. Passing flatus.   Objective:  General: no acute distress, appropriate affect Neurologic: alert, answering questions appropriately, following commands Respiratory: unlabored breathing on room air Skin: dressings clear/dry/intact  MSK (spine):  -Strength exam      Right  Left  EHL    5/5  5/5 TA    5/5  5/5 GSC    5/5  5/5 Knee extension  5/5  5/5 Hip flexion   5/5  5/5  -Sensory exam    Sensation intact to light touch in L3-S1 nerve distributions of bilateral lower extremities   Patient name: Harold Smith Patient MRN: [id number] Date: 03/07/23

## 2023-03-07 NOTE — Progress Notes (Addendum)
Orthopedic Surgery Post-operative Progress Note  Assessment: Patient is a 57 y.o. male who is currently admitted after undergoing L5/S1 ALIF and PSIF   Plan: -Operative plans complete -Drains: none -Out of bed as tolerated, no brace -No bending/lifting/twisting greater than 10 pounds -PT evaluate and treat -Pain control -Regular diet -No chemoprophylaxis for dvt or antiplatelets for 72 hours after surgery -Ancef x2 post-operative doses -Disposition: remain floor status  Depression/Anxiety -continue home duloxetine  HTN -continue home HCTZ and lisinopril   GERD -continue PPI treatment  Osteoporosis -resume fosamax on discharge -continue home calcium and vitamin D  ___________________________________________________________________________   Subjective: No acute events overnight. Was able to get on and off sleep for a couple of hours last night. Back pain is better controlled since seen yesterday evening. Not having any leg pain this morning. Denies paresthesias and numbness.   Objective:  General: no acute distress, appropriate affect Neurologic: alert, answering questions appropriately, following commands Respiratory: unlabored breathing on room air Skin: dressings clear/dry/intact  MSK (spine):  -Strength exam      Right  Left  EHL    5/5  5/5 TA    5/5  5/5 GSC    5/5  5/5 Knee extension  5/5  5/5 Hip flexion   5/5  5/5  -Sensory exam    Sensation intact to light touch in L3-S1 nerve distributions of bilateral lower extremities   Patient name: Harold Smith Patient MRN: [id number] Date: 03/07/23

## 2023-03-07 NOTE — Progress Notes (Addendum)
  Progress Note    03/07/2023 7:46 AM 1 Day Post-Op  Subjective:  nausea yesterday.  Tolerating breakfast.   Vitals:   03/07/23 0031 03/07/23 0447  BP: 122/79 99/77  Pulse: 69 79  Resp: 18 18  Temp: 98.6 F (37 C) 98.6 F (37 C)  SpO2: 98% 96%   Physical Exam: Lungs:  non labored Incisions:  abd incision c/d/i Extremities:  palpable DP pulses Neurologic: A&O  CBC    Component Value Date/Time   WBC 12.6 (H) 03/07/2023 0234   RBC 3.52 (L) 03/07/2023 0234   HGB 11.5 (L) 03/07/2023 0234   HGB 15.9 02/18/2014 0527   HCT 34.0 (L) 03/07/2023 0234   HCT 46.8 02/18/2014 0527   PLT 247 03/07/2023 0234   PLT 279 02/18/2014 0527   MCV 96.6 03/07/2023 0234   MCV 91 02/18/2014 0527   MCH 32.7 03/07/2023 0234   MCHC 33.8 03/07/2023 0234   RDW 13.6 03/07/2023 0234   RDW 14.5 02/18/2014 0527   LYMPHSABS 1.5 12/05/2022 1350   MONOABS 0.6 12/05/2022 1350   EOSABS 0.1 12/05/2022 1350   BASOSABS 0.0 12/05/2022 1350    BMET    Component Value Date/Time   NA 133 (L) 03/07/2023 0234   NA 139 02/18/2014 0527   K 3.8 03/07/2023 0234   K 3.8 02/18/2014 0527   CL 102 03/07/2023 0234   CL 108 (H) 02/18/2014 0527   CO2 25 03/07/2023 0234   CO2 25 02/18/2014 0527   GLUCOSE 136 (H) 03/07/2023 0234   GLUCOSE 89 02/18/2014 0527   BUN 10 03/07/2023 0234   BUN 15 02/18/2014 0527   CREATININE 0.82 03/07/2023 0234   CREATININE 1.13 10/31/2022 1514   CALCIUM 7.9 (L) 03/07/2023 0234   CALCIUM 7.9 (L) 08/31/2022 0033   GFRNONAA >60 03/07/2023 0234   GFRNONAA 98 05/10/2018 1211   GFRAA 113 05/10/2018 1211    INR No results found for: "INR"   Intake/Output Summary (Last 24 hours) at 03/07/2023 0746 Last data filed at 03/07/2023 0300 Gross per 24 hour  Intake 1950 ml  Output 1640 ml  Net 310 ml     Assessment/Plan:  57 y.o. male is s/p anterior spine exposure 1 Day Post-Op   BLE well perfused with palpable DP pulses Incision is well appearing Tolerating a diet this  morning Ok for discharge from vascular standpoint   Dagoberto Ligas, PA-C [facility] [phone] 03/07/2023 7:46 AM  I have seen and evaluated the patient. I agree with the PA note as documented above.  Postop day 1 status post anterior spine exposure for L5-S1 ALIF.  Had some nausea overnight but improved this morning.  Abdomen is soft with appropriate postop incisional tenderness.  No hematoma.  Left DP palpable.  Looks good from our standpoint.  Marty Heck, MD [facility] [place] Office: [phone]

## 2023-03-07 NOTE — Progress Notes (Signed)
This nurse noticed what looks to be an larger hematoma to the lower back. Visually starts at the top of they dressing run down towards sacral area and out toward bilateral oblique area. Extends about 1 in from flat surface of back. Tender at palpation. No drainage to dressing. Pt. Has full sensation to all extremities.MD Ileene Rubens notified no new orders, Charge Nurse notified Rapid. Rapid nurse advised pt.flat on back and monitor. This nurse outline the hematoma for monitoring.

## 2023-03-07 NOTE — Anesthesia Postprocedure Evaluation (Signed)
Anesthesia Post Note  Patient: Harold Smith  Procedure(s) Performed: L5-S1 ANTERIOR LUMBAR FUSION 1 LEVEL L5-S1 POSTERIOR INSTRUMENTED LUMBAR FUSION 1 LEVEL APPLICATION OF CELL SAVER ABDOMINAL EXPOSURE     Patient location during evaluation: PACU Anesthesia Type: General Level of consciousness: awake and alert Pain management: pain level controlled Vital Signs Assessment: post-procedure vital signs reviewed and stable Respiratory status: spontaneous breathing, nonlabored ventilation, respiratory function stable and patient connected to nasal cannula oxygen Cardiovascular status: blood pressure returned to baseline and stable Postop Assessment: no apparent nausea or vomiting Anesthetic complications: no   No notable events documented.  Last Vitals:  Vitals:   03/07/23 0031 03/07/23 0447  BP: 122/79 99/77  Pulse: 69 79  Resp: 18 18  Temp: 37 C 37 C  SpO2: 98% 96%    Last Pain:  Vitals:   03/07/23 0447  TempSrc: Oral  PainSc:                  Audry Pili

## 2023-03-08 ENCOUNTER — Encounter (HOSPITAL_COMMUNITY): Payer: Self-pay | Admitting: Orthopedic Surgery

## 2023-03-08 LAB — CBC WITH DIFFERENTIAL/PLATELET
Abs Immature Granulocytes: 0.04 10*3/uL (ref 0.00–0.07)
Basophils Absolute: 0 10*3/uL (ref 0.0–0.1)
Basophils Relative: 0 %
Eosinophils Absolute: 0.1 10*3/uL (ref 0.0–0.5)
Eosinophils Relative: 1 %
HCT: 31.5 % — ABNORMAL LOW (ref 39.0–52.0)
Hemoglobin: 10.6 g/dL — ABNORMAL LOW (ref 13.0–17.0)
Immature Granulocytes: 0 %
Lymphocytes Relative: 12 %
Lymphs Abs: 1.3 10*3/uL (ref 0.7–4.0)
MCH: 32.8 pg (ref 26.0–34.0)
MCHC: 33.7 g/dL (ref 30.0–36.0)
MCV: 97.5 fL (ref 80.0–100.0)
Monocytes Absolute: 1 10*3/uL (ref 0.1–1.0)
Monocytes Relative: 10 %
Neutro Abs: 8.4 10*3/uL — ABNORMAL HIGH (ref 1.7–7.7)
Neutrophils Relative %: 77 %
Platelets: 218 10*3/uL (ref 150–400)
RBC: 3.23 MIL/uL — ABNORMAL LOW (ref 4.22–5.81)
RDW: 13.7 % (ref 11.5–15.5)
WBC: 11 10*3/uL — ABNORMAL HIGH (ref 4.0–10.5)
nRBC: 0 % (ref 0.0–0.2)

## 2023-03-08 LAB — CBC
HCT: 32.9 % — ABNORMAL LOW (ref 39.0–52.0)
Hemoglobin: 11.4 g/dL — ABNORMAL LOW (ref 13.0–17.0)
MCH: 33.1 pg (ref 26.0–34.0)
MCHC: 34.7 g/dL (ref 30.0–36.0)
MCV: 95.6 fL (ref 80.0–100.0)
Platelets: 237 10*3/uL (ref 150–400)
RBC: 3.44 MIL/uL — ABNORMAL LOW (ref 4.22–5.81)
RDW: 13.9 % (ref 11.5–15.5)
WBC: 12.2 10*3/uL — ABNORMAL HIGH (ref 4.0–10.5)
nRBC: 0 % (ref 0.0–0.2)

## 2023-03-08 MED ORDER — SODIUM CHLORIDE 0.9 % IV BOLUS
1000.0000 mL | Freq: Once | INTRAVENOUS | Status: AC
  Administered 2023-03-08: 1000 mL via INTRAVENOUS

## 2023-03-08 MED ORDER — GABAPENTIN 300 MG PO CAPS: 300.0000 mg | ORAL_CAPSULE | Freq: Three times a day (TID) | ORAL | Status: DC

## 2023-03-08 MED ORDER — OXYCODONE HCL 5 MG PO TABS
10.0000 mg | ORAL_TABLET | ORAL | Status: DC | PRN
  Administered 2023-03-08 (×4): 15 mg via ORAL
  Administered 2023-03-09: 10 mg via ORAL
  Administered 2023-03-09 – 2023-03-11 (×11): 15 mg via ORAL
  Filled 2023-03-08 (×3): qty 3
  Filled 2023-03-08: qty 2
  Filled 2023-03-08 (×12): qty 3

## 2023-03-08 NOTE — Progress Notes (Signed)
Mobility Specialist Progress Note   03/08/23 1010  Pain Assessment  Faces Pain Scale 6  Pain Location back/abdomen  Pain Descriptors / Indicators Discomfort;Grimacing;Operative site guarding  Pain Intervention(s) Patient requesting pain meds-RN notified  Mobility  Activity Ambulated independently in hallway  Level of Assistance Independent  Assistive Device None  Distance Ambulated (ft) 550 ft  Range of Motion/Exercises Active;All extremities  Activity Response Tolerated well   Patient received in supine and agreeable to participate despite complaints of increasing pain. Swelling apparent at operative site, ice packs given as an intervention. Ambulated independently with slow steady gait. Returned to room without incident. Was left in recliner chair with all needs met, call bell in reach.   [place] Sarh Kirschenbaum, BS EXP Mobility Specialist Please contact via SecureChat or Rehab office at [phone]

## 2023-03-08 NOTE — Progress Notes (Signed)
Asymptomatic tachycardia and hypotension at rest (see flowsheet); provider made aware. See new orders.

## 2023-03-08 NOTE — Progress Notes (Signed)
Occupational Therapy Treatment Patient Details Name: Harold Smith MRN: [id number] DOB: 1966-09-30 Today's Date: 03/08/2023   History of present illness 57 yo s/p L5/S1 ALIF/ PLIF. S/p ORIF R distal femur fx on 9/5. PMH: gastric bypass, sleep apnea; anxiety, HTN, metabolic bone disease, achilles tendon repair.   OT comments  Patient demonstrated good progress with OT treatment. Patient demonstrated good understanding of back precautions and log rolling technique to get to EOB and able to stand at sink for bathing. Patient provided with education on AE use for LB dressing and patient was able to return demonstration with supervision. Patient educated on possible means of purchasing for home use. Acute OT to continue to follow for AE training to ensure good carryover.    Recommendations for follow up therapy are one component of a multi-disciplinary discharge planning process, led by the attending physician.  Recommendations may be updated based on patient status, additional functional criteria and insurance authorization.    Follow Up Recommendations  No OT follow up     Assistance Recommended at Discharge Intermittent Supervision/Assistance  Patient can return home with the following  Assistance with cooking/housework;A little help with bathing/dressing/bathroom;Assist for transportation   Equipment Recommendations  None recommended by OT    Recommendations for Other Services      Precautions / Restrictions Precautions Precautions: Back Restrictions Weight Bearing Restrictions: No       Mobility Bed Mobility Overal bed mobility: Needs Assistance Bed Mobility: Rolling, Sidelying to Sit Rolling: Modified independent (Device/Increase time) Sidelying to sit: Modified independent (Device/Increase time)       General bed mobility comments: demonstrated good understanding of log rolling technique and maintained back precautions    Transfers Overall transfer level: Needs  assistance Equipment used: None               General transfer comment: supervision for safety     Balance Overall balance assessment: No apparent balance deficits (not formally assessed)                                         ADL either performed or assessed with clinical judgement   ADL Overall ADL's : Needs assistance/impaired     Grooming: Set up;Supervision/safety;Standing   Upper Body Bathing: Supervision/ safety;Set up;Standing Upper Body Bathing Details (indicate cue type and reason): at sink Lower Body Bathing: Supervison/ safety;Sit to/from stand Lower Body Bathing Details (indicate cue type and reason): at sink for peri area     Lower Body Dressing: Supervision/safety;With adaptive equipment Lower Body Dressing Details (indicate cue type and reason): education on adaptive equipment use for LB dressing with reacher, sock aide and dressing stick               General ADL Comments: demonstrated good understanding of AE use    Extremity/Trunk Assessment              Vision       Perception     Praxis      Cognition Arousal/Alertness: Awake/alert Behavior During Therapy: WFL for tasks assessed/performed Overall Cognitive Status: Within Functional Limits for tasks assessed                                          Exercises      Shoulder Instructions  General Comments HR increased to 120s with mobility and in the 90s at rest/seated    Pertinent Vitals/ Pain       Pain Assessment Pain Assessment: Faces Faces Pain Scale: Hurts little more Pain Location: back/abdomen Pain Descriptors / Indicators: Discomfort, Grimacing, Operative site guarding Pain Intervention(s): Monitored during session, Repositioned  Home Living                                          Prior Functioning/Environment              Frequency  Min 2X/week        Progress Toward Goals  OT  Goals(current goals can now be found in the care plan section)  Progress towards OT goals: Progressing toward goals  Acute Rehab OT Goals Patient Stated Goal: get better OT Goal Formulation: With patient Time For Goal Achievement: 03/21/23 Potential to Achieve Goals: Good ADL Goals Pt Will Perform Lower Body Bathing: with modified independence;sit to/from stand;with adaptive equipment Pt Will Perform Lower Body Dressing: with modified independence;sit to/from stand;with adaptive equipment Pt Will Transfer to Toilet: with modified independence;ambulating Pt Will Perform Toileting - Clothing Manipulation and hygiene: with modified independence;sit to/from stand Additional ADL Goal #1: Pt will independently verbalize 3 back precautions  Plan Discharge plan remains appropriate    Co-evaluation                 AM-PAC OT "6 Clicks" Daily Activity     Outcome Measure   Help from another person eating meals?: None Help from another person taking care of personal grooming?: A Little Help from another person toileting, which includes using toliet, bedpan, or urinal?: A Little Help from another person bathing (including washing, rinsing, drying)?: A Little Help from another person to put on and taking off regular upper body clothing?: A Little Help from another person to put on and taking off regular lower body clothing?: A Little 6 Click Score: 19    End of Session Equipment Utilized During Treatment: Gait belt  OT Visit Diagnosis: Pain Pain - part of body:  (back)   Activity Tolerance Patient tolerated treatment well   Patient Left in chair;with call bell/phone within reach   Nurse Communication Mobility status;Patient requests pain meds        Time: [id number] OT Time Calculation (min): 29 min  Charges: OT General Charges $OT Visit: 1 Visit OT Treatments $Self Care/Home Management : 23-37 mins  Lodema Hong, [place]  Office  [place] 03/08/2023, 10:43 AM

## 2023-03-08 NOTE — Progress Notes (Signed)
Orthopedic Surgery Post-operative Progress Note  Assessment: Patient is a 57 y.o. male who is currently admitted after undergoing L5/S1 ALIF and PSIF   Plan: -Operative plans complete -Drains: none -Out of bed as tolerated, no brace -No bending/lifting/twisting greater than 10 pounds -PT evaluate and treat -Pain control -Regular diet -No chemoprophylaxis for dvt or antiplatelets for 72 hours after surgery -Ancef x2 post-operative doses -Disposition: remain floor status  Depression/Anxiety -continue home duloxetine  HTN -continue home HCTZ and lisinopril   GERD -continue PPI treatment  Osteoporosis -resume fosamax on discharge -continue home calcium and vitamin D  ___________________________________________________________________________   Subjective: Was hypotensive overnight. Responded to fluids. Hgb stable at 11.4. Back pain still significant. Not having any leg pain. Denies paresthesias and numbness.   Objective:  General: no acute distress, appropriate affect Neurologic: sleeping but awakes to voice, answering questions appropriately, following commands Respiratory: unlabored breathing on room air Skin: dressings clear/dry/intact, swelling around the incision site  MSK (spine):  -Strength exam      Right  Left  EHL    5/5  5/5 TA    5/5  5/5 GSC    5/5  5/5 Knee extension  5/5  5/5 Hip flexion   5/5  5/5  -Sensory exam    Sensation intact to light touch in L3-S1 nerve distributions of bilateral lower extremities   Patient name: Harold Smith Patient MRN: [id number] Date: 03/08/23

## 2023-03-09 ENCOUNTER — Other Ambulatory Visit (HOSPITAL_COMMUNITY): Payer: Self-pay

## 2023-03-09 MED ORDER — OXYCODONE HCL 10 MG PO TABS
10.0000 mg | ORAL_TABLET | ORAL | 0 refills | Status: AC | PRN
  Filled 2023-03-09: qty 60, 10d supply, fill #0

## 2023-03-09 MED ORDER — POLYETHYLENE GLYCOL 3350 17 GM/SCOOP PO POWD
17.0000 g | Freq: Every day | ORAL | 0 refills | Status: DC
  Filled 2023-03-09: qty 238, 14d supply, fill #0

## 2023-03-09 MED ORDER — METHOCARBAMOL 500 MG PO TABS
500.0000 mg | ORAL_TABLET | Freq: Four times a day (QID) | ORAL | 0 refills | Status: AC
  Filled 2023-03-09: qty 40, 10d supply, fill #0

## 2023-03-09 MED ORDER — ACETAMINOPHEN 500 MG PO TABS
1000.0000 mg | ORAL_TABLET | Freq: Three times a day (TID) | ORAL | 0 refills | Status: AC
  Filled 2023-03-09: qty 126, 21d supply, fill #0

## 2023-03-09 MED ORDER — ACETAMINOPHEN 500 MG PO TABS
1000.0000 mg | ORAL_TABLET | Freq: Three times a day (TID) | ORAL | Status: DC
  Administered 2023-03-09 – 2023-03-11 (×8): 1000 mg via ORAL
  Filled 2023-03-09 (×8): qty 2

## 2023-03-09 MED ORDER — ENOXAPARIN SODIUM 40 MG/0.4ML IJ SOSY
40.0000 mg | PREFILLED_SYRINGE | Freq: Every day | INTRAMUSCULAR | Status: DC
  Administered 2023-03-09 – 2023-03-10 (×2): 40 mg via SUBCUTANEOUS
  Filled 2023-03-09 (×2): qty 0.4

## 2023-03-09 MED ORDER — SENNA 8.6 MG PO TABS
1.0000 | ORAL_TABLET | Freq: Two times a day (BID) | ORAL | 0 refills | Status: DC
  Filled 2023-03-09: qty 120, 60d supply, fill #0

## 2023-03-09 MED FILL — Heparin Sodium (Porcine) Inj 1000 Unit/ML: INTRAMUSCULAR | Qty: 30 | Status: AC

## 2023-03-09 MED FILL — Sodium Chloride IV Soln 0.9%: INTRAVENOUS | Qty: 1000 | Status: AC

## 2023-03-09 NOTE — Discharge Summary (Signed)
Orthopedic Surgery Discharge Summary  Patient name: Harold Smith Patient MRN: [id number] [place] today: 03/06/2023 Discharge date: 03/10/2023  Attending physician: Ileene Rubens, MD Final diagnosis: L5/S1 isthmic spondylolisthesis, lumbar radiculopathy Findings: L5/S1 isthmic spondylolisthesis, bilateral pars defect at L5  Hospital course: Patient is a 57 y.o. male who was admitted after undergoing L5/S1 anterior lumbar interbody fusion and posterior instrumented spinal fusion. The patient had significant pain immediately after surgery. He required IV pain medication for the first couple of days but pain eventually was controlled with a multimodal oral regimen including oxycodone. Labs during the hospitalization revealed expected post-operative anemia from surgical blood loss. His hemoglobin was stable at 11.4 and he did not require transfusion. The patient worked with physical therapy who recommended discharge to home. The patient was tolerating an oral diet without issue and was voiding spontaneously after surgery. He was having regular bowel movements by the end of the admission. The patient's vitals were stable on the day of discharge. The patient was medically ready for discharge and was discharge to home on post-operative day 5.  Instructions:   Orthopedic Surgery Discharge Instructions   Patient name: Harold Smith Procedure Performed: L5/S1 anterior lumbar interbody fusion and posterior instrumented fusion Date of Surgery: 03/06/2023 Surgeon: Ileene Rubens, MD   Pre-operative Diagnosis: L5/S1 isthmic spondylolisthesis, lumbar radiculopathy Post-operative Diagnosis: same as above   Discharge Date: 03/11/2023 Discharged to: home Discharge Condition: stable   Activity: You should refrain from bending, lifting, or twisting with objects greater than ten pounds until three months after surgery. You are encouraged to walk as much as desired. You can perform household activities  such as cleaning dishes, doing laundry, vacuuming, etc. as long as the ten-pound restriction is followed. You do not need to wear a brace during the post-operative period.    Incision Care: Your incisions have dressings over them. The dressings should remain in place and dry at all times for a total of one week after surgery. After one week, you can remove the dressing. Underneath the dressing, you will find pieces of tape on the back incision and skin glue over the abdominal incision. You should leave these pieces of tape and the skin glue in place. They will both fall off with time. Do not pick, rub, or scrub at them. Do not put cream or lotion over the surgical area. After one week and once the dressing is off, it is okay to let soap and water run over your incision. Again, do not pick, scrub, or rub at the pieces of tape or the skin glue when bathing. Do not submerge (e.g., take a bath, swim, go in a hot tub, etc.) until six weeks after surgery. There may be some bloody drainage from the incision into the dressing after surgery. This is normal. You do not need to replace the dressing. Continue to leave it in place for the one week as instructed above. Should the dressing become saturated with blood or drainage, please call the office for further instructions.    Medications: You have been prescribed oxycodone. This is a narcotic pain medication and should only be taken as prescribed. You should not drink alcohol or operate heavy machinery (including driving) while taking this medication. The oxycodone can cause constipation as a side effect. For that reason, you have been prescribed senna and miralax. These are both laxatives. You do not need to take this medication if you develop diarrhea. Should you remain constipated even while taking these medications, please increase the  dose of miralax to twice daily. Tylenol has been prescribed to be taken every 8 hours, which will give you additional pain relief.  Robaxin is a muscle relaxer that has been prescribed to you for muscle spasm type pain. Take this medication as needed.    Do not take NSAIDs (ibuprofen, Aleve, Celebrex, naproxen, meloxicam, etc.) for the first 6 weeks after surgery as there is some evidence that their use may decrease the chances of successful fusion.    In order to set expectations for opioid prescriptions, you will only be prescribed opioids for a total of six weeks after surgery and, at two-weeks after surgery, your opioid prescription will start to tapered (decreased dosage and number of pills). If you have ongoing need for opioid medication six weeks after surgery, you will be referred to pain management. If you are already established with a provider that is giving you opioid medications, you should schedule an appointment with them for six weeks after surgery if you feel you are going to need another prescription. State law only allows for opioid prescriptions one week at a time. If you are running out of opioid medication near the end of the week, please call the office during business hours before running out so I can send you another prescription.    You may resume any home blood thinners (warfarin, lovenox, apixaban, plavix, xarelto, etc) 72 hours after your surgery. Take these medications as they were previously prescribed.   Driving: You should not drive while taking narcotic pain medications. You should start getting back to driving slowly and you may want to try driving in a parking lot before doing anything more.    Diet: You are safe to resume your regular diet after surgery.    Reasons to Call the Office After Surgery: You should feel free to call the office with any concerns or questions you have in the post-operative period, but you should definitely notify the office if you develop: -shortness of breath, chest pain, or trouble breathing -excessive bleeding, drainage, redness, or swelling around the surgical  site -fevers, chills, or pain that is getting worse with each passing day -persistent nausea or vomiting -new weakness in either leg -new or worsening numbness or tingling in either leg -numbness in the groin, bowel or bladder incontinence -other concerns about your surgery   Follow Up Appointments: You should have an office appointment scheduled for approximately two weeks after surgery. If you do not remember when this appointment is or do not already have it scheduled, please call the office to schedule.    Office Information:  -Ileene Rubens, MD -Phone number: [phone] -Address: [address]                  [place], [place] [address]

## 2023-03-09 NOTE — Progress Notes (Signed)
Occupational Therapy Treatment Patient Details Name: Harold Smith MRN: [id number] DOB: May 31, 1966 Today's Date: 03/09/2023   History of present illness 57 yo s/p L5/S1 ALIF/ PLIF. S/p ORIF R distal femur fx on 9/5. PMH: gastric bypass, sleep apnea; anxiety, HTN, metabolic bone disease, achilles tendon repair.   OT comments  Patient continues to make good gains with OT treatment. Patient supervision to mod I for mobility, standing at sink and toilet transfers without an assistive device.  Education provided on reacher use for donning pants/shorts with patient able to return demonstration with supervision. Patient states he feels he can use adaptive equipment at home and has a good understanding of use. Discharge recommendations continue to be appropriate.    Recommendations for follow up therapy are one component of a multi-disciplinary discharge planning process, led by the attending physician.  Recommendations may be updated based on patient status, additional functional criteria and insurance authorization.    Follow Up Recommendations  No OT follow up     Assistance Recommended at Discharge PRN  Patient can return home with the following  Assistance with cooking/housework;A little help with bathing/dressing/bathroom;Assist for transportation   Equipment Recommendations  None recommended by OT    Recommendations for Other Services      Precautions / Restrictions Precautions Precautions: Back Precaution Comments: patient able to recall precautions Restrictions Weight Bearing Restrictions: No       Mobility Bed Mobility Overal bed mobility: Needs Assistance Bed Mobility: Rolling, Sidelying to Sit Rolling: Modified independent (Device/Increase time) Sidelying to sit: Modified independent (Device/Increase time)            Transfers Overall transfer level: Needs assistance Equipment used: None               General transfer comment: distant supervision to  Mod I for transfers     Balance Overall balance assessment: No apparent balance deficits (not formally assessed)                                         ADL either performed or assessed with clinical judgement   ADL Overall ADL's : Needs assistance/impaired     Grooming: Modified independent;Standing;Wash/dry hands;Wash/dry face;Oral care Grooming Details (indicate cue type and reason): standing at sink Upper Body Bathing: Modified independent;Standing Upper Body Bathing Details (indicate cue type and reason): at sink Lower Body Bathing: Modified independent;Sit to/from stand Lower Body Bathing Details (indicate cue type and reason): at sink for peri area     Lower Body Dressing: Supervision/safety;With adaptive equipment Lower Body Dressing Details (indicate cue type and reason): education on reacher use for donning pants/shorts with patient able to return demonstration with supervision Toilet Transfer: Supervision/safety Toilet Transfer Details (indicate cue type and reason): distant supervision           General ADL Comments: continues to make gains with AE use    Extremity/Trunk Assessment              Vision       Perception     Praxis      Cognition Arousal/Alertness: Awake/alert Behavior During Therapy: WFL for tasks assessed/performed Overall Cognitive Status: Within Functional Limits for tasks assessed  Exercises      Shoulder Instructions       General Comments      Pertinent Vitals/ Pain       Pain Assessment Pain Assessment: Faces Faces Pain Scale: Hurts little more Pain Location: back/abdomen Pain Descriptors / Indicators: Discomfort, Grimacing, Operative site guarding Pain Intervention(s): Limited activity within patient's tolerance, Monitored during session, Ice applied  Home Living                                          Prior  Functioning/Environment              Frequency  Min 2X/week        Progress Toward Goals  OT Goals(current goals can now be found in the care plan section)  Progress towards OT goals: Progressing toward goals  Acute Rehab OT Goals Patient Stated Goal: go home OT Goal Formulation: With patient Time For Goal Achievement: 03/21/23 Potential to Achieve Goals: Good ADL Goals Pt Will Perform Lower Body Bathing: with modified independence;sit to/from stand;with adaptive equipment Pt Will Perform Lower Body Dressing: with modified independence;sit to/from stand;with adaptive equipment Pt Will Transfer to Toilet: with modified independence;ambulating Pt Will Perform Toileting - Clothing Manipulation and hygiene: with modified independence;sit to/from stand Additional ADL Goal #1: Pt will independently verbalize 3 back precautions  Plan Discharge plan remains appropriate    Co-evaluation                 AM-PAC OT "6 Clicks" Daily Activity     Outcome Measure   Help from another person eating meals?: None Help from another person taking care of personal grooming?: A Little Help from another person toileting, which includes using toliet, bedpan, or urinal?: A Little Help from another person bathing (including washing, rinsing, drying)?: A Little Help from another person to put on and taking off regular upper body clothing?: A Little Help from another person to put on and taking off regular lower body clothing?: A Little 6 Click Score: 19    End of Session    OT Visit Diagnosis: Pain Pain - part of body:  (back/abdomen)   Activity Tolerance Patient tolerated treatment well   Patient Left in chair;with call bell/phone within reach   Nurse Communication Mobility status        Time: [id number] OT Time Calculation (min): 23 min  Charges: OT General Charges $OT Visit: 1 Visit OT Treatments $Self Care/Home Management : 23-37 mins  Lodema Hong, [place]  Office [place] 03/09/2023, 10:14 AM

## 2023-03-09 NOTE — Progress Notes (Signed)
Mobility Specialist Progress Note   03/09/23 1126  Mobility  Activity Ambulated with assistance in hallway  Level of Assistance Independent  Assistive Device None  Distance Ambulated (ft) 550 ft  Range of Motion/Exercises Active;All extremities  Activity Response Tolerated well   Patient received dangling EOB and agreeable to participate. Ambulated independently with slow steady gait. Returned to room without complaint or incident. Was left standing at bedside with all needs met, call bell in reach.   [place] Paulino Cork, BS EXP Mobility Specialist Please contact via SecureChat or Rehab office at [phone]

## 2023-03-09 NOTE — Progress Notes (Signed)
Orthopedic Surgery Post-operative Progress Note  Assessment: Patient is a 57 y.o. male who is currently admitted after undergoing L5/S1 ALIF and PSIF   Plan: -Operative plans complete -Drains: none -Out of bed as tolerated, no brace -No bending/lifting/twisting greater than 10 pounds -PT evaluate and treat -Pain control -Regular diet -No chemoprophylaxis for dvt or antiplatelets for 72 hours after surgery -Ancef x2 post-operative doses -Anticipate discharge to home tomorrow  Depression/Anxiety -continue home duloxetine  HTN -continue home HCTZ and lisinopril   GERD -continue PPI treatment  Osteoporosis -resume fosamax on discharge -continue home calcium and vitamin D  ___________________________________________________________________________   Subjective: No acute events overnight. Back pain slowly getting better. Was able to walk the halls last night. Had no leg pain when walking the halls and no leg pain this morning. Is having regular bowel movements. Denies paresthesias and numbness.   Objective:  General: no acute distress, appropriate affect Neurologic: alert, answering questions appropriately, following commands Respiratory: unlabored breathing on room air Skin: dressings clear/dry/intact, swelling improved around the incision site  MSK (spine):  -Strength exam      Right  Left  EHL    5/5  5/5 TA    5/5  5/5 GSC    5/5  5/5 Knee extension  5/5  5/5 Hip flexion   5/5  5/5  -Sensory exam    Sensation intact to light touch in L3-S1 nerve distributions of bilateral lower extremities   Patient name: Harold Smith Patient MRN: [id number] Date: 03/09/23

## 2023-03-09 NOTE — Discharge Instructions (Signed)
Orthopedic Surgery Discharge Instructions  Patient name: Harold Smith Procedure Performed: L5/S1 anterior lumbar interbody fusion and posterior instrumented fusion Date of Surgery: 03/06/2023 Surgeon: Ileene Rubens, MD  Pre-operative Diagnosis: L5/S1 isthmic spondylolisthesis, lumbar radiculopathy Post-operative Diagnosis: same as above  Discharge Date: 03/10/2023 Discharged to: home Discharge Condition: stable  Activity: You should refrain from bending, lifting, or twisting with objects greater than ten pounds until three months after surgery. You are encouraged to walk as much as desired. You can perform household activities such as cleaning dishes, doing laundry, vacuuming, etc. as long as the ten-pound restriction is followed. You do not need to wear a brace during the post-operative period.   Incision Care: Your incisions have dressings over them. The dressings should remain in place and dry at all times for a total of one week after surgery. After one week, you can remove the dressing. Underneath the dressing, you will find pieces of tape on the back incision and skin glue over the abdominal incision. You should leave these pieces of tape and the skin glue in place. They will both fall off with time. Do not pick, rub, or scrub at them. Do not put cream or lotion over the surgical area. After one week and once the dressing is off, it is okay to let soap and water run over your incision. Again, do not pick, scrub, or rub at the pieces of tape or the skin glue when bathing. Do not submerge (e.g., take a bath, swim, go in a hot tub, etc.) until six weeks after surgery. There may be some bloody drainage from the incision into the dressing after surgery. This is normal. You do not need to replace the dressing. Continue to leave it in place for the one week as instructed above. Should the dressing become saturated with blood or drainage, please call the office for further instructions.    Medications: You have been prescribed oxycodone. This is a narcotic pain medication and should only be taken as prescribed. You should not drink alcohol or operate heavy machinery (including driving) while taking this medication. The oxycodone can cause constipation as a side effect. For that reason, you have been prescribed senna and miralax. These are both laxatives. You do not need to take this medication if you develop diarrhea. Should you remain constipated even while taking these medications, please increase the dose of miralax to twice daily. Tylenol has been prescribed to be taken every 8 hours, which will give you additional pain relief. Robaxin is a muscle relaxer that has been prescribed to you for muscle spasm type pain. Take this medication as needed.   Do not take NSAIDs (ibuprofen, Aleve, Celebrex, naproxen, meloxicam, etc.) for the first 6 weeks after surgery as there is some evidence that their use may decrease the chances of successful fusion.   In order to set expectations for opioid prescriptions, you will only be prescribed opioids for a total of six weeks after surgery and, at two-weeks after surgery, your opioid prescription will start to tapered (decreased dosage and number of pills). If you have ongoing need for opioid medication six weeks after surgery, you will be referred to pain management. If you are already established with a provider that is giving you opioid medications, you should schedule an appointment with them for six weeks after surgery if you feel you are going to need another prescription. State law only allows for opioid prescriptions one week at a time. If you are running out of opioid  medication near the end of the week, please call the office during business hours before running out so I can send you another prescription.   You may resume any home blood thinners (warfarin, lovenox, apixaban, plavix, xarelto, etc) 72 hours after your surgery. Take these  medications as they were previously prescribed.  Driving: You should not drive while taking narcotic pain medications. You should start getting back to driving slowly and you may want to try driving in a parking lot before doing anything more.   Diet: You are safe to resume your regular diet after surgery.   Reasons to Call the Office After Surgery: You should feel free to call the office with any concerns or questions you have in the post-operative period, but you should definitely notify the office if you develop: -shortness of breath, chest pain, or trouble breathing -excessive bleeding, drainage, redness, or swelling around the surgical site -fevers, chills, or pain that is getting worse with each passing day -persistent nausea or vomiting -new weakness in either leg -new or worsening numbness or tingling in either leg -numbness in the groin, bowel or bladder incontinence -other concerns about your surgery  Follow Up Appointments: You should have an office appointment scheduled for approximately two weeks after surgery. If you do not remember when this appointment is or do not already have it scheduled, please call the office to schedule.   Office Information:  -Ileene Rubens, MD -Phone number: [phone] -Address: [address]       [place], [place] [address]

## 2023-03-10 ENCOUNTER — Inpatient Hospital Stay (HOSPITAL_COMMUNITY): Payer: 59

## 2023-03-10 NOTE — Progress Notes (Signed)
Pt alert and oriented, refused bed alarm. Able to ambulate in room. Rested quietly awake at short intervals, Educated on risk of no bed alarm and being up unassisted. Pt aware and maintains he is capable.

## 2023-03-10 NOTE — TOC Transition Note (Signed)
Five discharge meds from [facility] locked up in main pharmacy in case of weekend discharge.

## 2023-03-10 NOTE — Progress Notes (Addendum)
Orthopedic Surgery Post-operative Progress Note  Assessment: Patient is a 57 y.o. male who is currently admitted after undergoing L5/S1 ALIF and PSIF   Plan: -Operative plans complete -Drains: none -Out of bed as tolerated, no brace -No bending/lifting/twisting greater than 10 pounds -PT evaluate and treat -Pain control -Regular diet -No chemoprophylaxis for dvt or antiplatelets for 72 hours after surgery -Ancef x2 post-operative doses -Possible discharge this afternoon  Depression/Anxiety -continue home duloxetine  HTN -continue home HCTZ and lisinopril   GERD -continue PPI treatment  Osteoporosis -resume fosamax on discharge -continue home calcium and vitamin D  ___________________________________________________________________________   Subjective: No acute events overnight. Pain is a little worse this morning since dilaudid stopped yesterday. He understands rationale for stopping that medication and wants to continue with the oxycodone only. No radiating leg pain into either leg. Denies paresthesias and numbness.   Objective:  General: no acute distress, appropriate affect Neurologic: alert, answering questions appropriately, following commands Respiratory: unlabored breathing on room air Skin: dressings clear/dry/intact, swelling similar most of it is at the cranial aspect of the incision  MSK (spine):  -Strength exam      Right  Left  EHL    5/5  5/5 TA    5/5  5/5 GSC    5/5  5/5 Knee extension  5/5  5/5 Hip flexion   5/5  5/5  -Sensory exam    Sensation intact to light touch in L3-S1 nerve distributions of bilateral lower extremities   Patient name: Harold Smith Patient MRN: [id number] Date: 03/10/23

## 2023-03-10 NOTE — Plan of Care (Signed)

## 2023-03-11 ENCOUNTER — Other Ambulatory Visit: Payer: Self-pay | Admitting: Family

## 2023-03-11 LAB — GLUCOSE, CAPILLARY: Glucose-Capillary: 86 mg/dL (ref 70–99)

## 2023-03-11 NOTE — Plan of Care (Signed)

## 2023-03-11 NOTE — Progress Notes (Addendum)
Orthopedic Surgery Post-operative Progress Note  Assessment: Patient is a 57 y.o. male who is currently admitted after undergoing L5/S1 ALIF and PSIF   Plan: -Operative plans complete -Drains: none -Out of bed as tolerated, no brace -No bending/lifting/twisting greater than 10 pounds -PT evaluate and treat -Pain control -Regular diet -DVT ppx: lovenox -Ancef x2 post-operative doses -Discharge to home today  Depression/Anxiety -continue home duloxetine  HTN -continue home HCTZ and lisinopril   GERD -continue PPI treatment  Osteoporosis -resume fosamax on discharge -continue home calcium and vitamin D  ___________________________________________________________________________   Subjective: No acute events overnight. Pain has improved. Pain is tolerable with oral medication at this point. No leg pain. All of his pain is in his back. Denies paresthesias and numbness.   Objective:  General: no acute distress, appropriate affect Neurologic: alert, answering questions appropriately, following commands Respiratory: unlabored breathing on room air Skin: dressings clear/dry/intact, swelling similar at the cranial aspect of the incision  MSK (spine):  -Strength exam      Right  Left  EHL    5/5  5/5 TA    5/5  5/5 GSC    5/5  5/5 Knee extension  5/5  5/5 Hip flexion   5/5  5/5  -Sensory exam    Sensation intact to light touch in L3-S1 nerve distributions of bilateral lower extremities   Patient name: Harold Smith Patient MRN: [id number] Date: 03/11/23

## 2023-03-13 ENCOUNTER — Telehealth: Payer: Self-pay

## 2023-03-13 NOTE — Transitions of Care (Post Inpatient/ED Visit) (Signed)
   03/13/2023  Name: Harold Smith MRN: [id number] DOB: [birth year]  Today's TOC FU Call Status: Today's TOC FU Call Status:: Successful TOC FU Call Competed TOC FU Call Complete Date: 03/13/23  Transition Care Management Follow-up Telephone Call Date of Discharge: 03/11/23 Discharge Facility: [facility]) Type of Discharge: Inpatient Admission Primary Inpatient Discharge Diagnosis:: L5/S1 isthmic spondylolisthesis, lumbar radiculopathy How have you been since you were released from the hospital?: Better Any questions or concerns?: No  Items Reviewed: Did you receive and understand the discharge instructions provided?: No Medications obtained and verified?: Yes (Medications Reviewed) Any new allergies since your discharge?: No Dietary orders reviewed?: Yes Do you have support at home?: Yes  Home Care and Equipment/Supplies: [facility] Ordered?: No Any new equipment or medical supplies ordered?: No  Functional Questionnaire: Do you need assistance with bathing/showering or dressing?: No Do you need assistance with meal preparation?: No Do you need assistance with eating?: No Do you have difficulty maintaining continence: No Do you need assistance with getting out of bed/getting out of a chair/moving?: No Do you have difficulty managing or taking your medications?: No  Follow up appointments reviewed: PCP Follow-up appointment confirmed?: No (specialist) MD Provider Line [phone] Given: Yes [facility] Follow-up appointment confirmed?: Yes Date of Specialist follow-up appointment?: 03/20/23 Follow-Up Specialty Provider:: Dr Laurance Flatten Do you need transportation to your follow-up appointment?: No Do you understand care options if your condition(s) worsen?: Yes-patient verbalized understanding    [place] LPN [place] Direct Dial [phone]

## 2023-03-20 ENCOUNTER — Other Ambulatory Visit (INDEPENDENT_AMBULATORY_CARE_PROVIDER_SITE_OTHER): Payer: 59

## 2023-03-20 ENCOUNTER — Ambulatory Visit (INDEPENDENT_AMBULATORY_CARE_PROVIDER_SITE_OTHER): Payer: 59 | Admitting: Orthopedic Surgery

## 2023-03-20 DIAGNOSIS — Z981 Arthrodesis status: Secondary | ICD-10-CM

## 2023-03-20 NOTE — Progress Notes (Signed)
Orthopedic Surgery Post-operative Office Visit  Procedure: L5/S1 ALIF and PSIF Date of Surgery: 03/06/2023  Assessment: Patient is a 57 y.o. who is doing well after L5/S1 ALIF and PSIF   Plan: -Operative plans complete -Out of bed as tolerated, no brace -Okay to let soap and water run over the incision, do not submerge -No bending/lifting/twisting greater than 10 pounds -Pain management: Weaning oxycodone -Return to office in 4 weeks, lumbar x-rays needed at next visit: AP/lateral lumbar  ___________________________________________________________________________   Subjective: Patient has been doing well since discharge from the hospital.  He has not noticed any leg pain since surgery.  He is still having some back pain but that has been improving with time.  He has noticed swelling around the posterior incision that can be painful if he sits directly on that or leans back on it.  He feels that the swelling that area has gotten better with time.  Denies paresthesia numbness.  Has not noticed any redness or drainage from his incision sites.  Objective:  General: no acute distress, appropriate affect Neurologic: alert, answering questions appropriately, following commands Respiratory: unlabored breathing on room air Skin: Both incisions appear to be healing well.  Incisions are well-approximated with no active/expressible drainage, no erythema, no induration.  There is a hematoma around the cranial aspect of the posterior lumbar incision.  It is decreased in size since his hospitalization  MSK (spine):  -Strength exam      Left  Right  EHL    5/5  5/5 TA    5/5  5/5 GSC    5/5  5/5 Knee extension  5/5  5/5 Hip flexion   5/5  5/5  -Sensory exam    Sensation intact to light touch in L3-S1 nerve distributions of bilateral lower extremities  Imaging: X-rays of the lumbar spine taken today were independently reviewed and interpreted, showing anterior lumbar interbody fusion cage  in similar position to immediately postoperative films.  Posterior screws are in similar position with no lucency around them or evidence of backing out.   Patient name: Harold Smith Patient MRN: [id number] Date of visit: 03/20/23

## 2023-03-24 ENCOUNTER — Telehealth: Payer: Self-pay | Admitting: Pharmacy Technician

## 2023-03-24 NOTE — Telephone Encounter (Signed)
Patient Advocate Encounter   Received notification that prior authorization for QUEtiapine Fumarate 25MG  tablets is required.   PA submitted on 03/24/2023 [place] [facility] Electronic Prior Authorization Request Form Status is pending

## 2023-03-27 DIAGNOSIS — Z419 Encounter for procedure for purposes other than remedying health state, unspecified: Secondary | ICD-10-CM | POA: Diagnosis not present

## 2023-03-28 ENCOUNTER — Encounter: Payer: Self-pay | Admitting: Urology

## 2023-03-28 ENCOUNTER — Encounter: Payer: Self-pay | Admitting: Oncology

## 2023-03-29 ENCOUNTER — Ambulatory Visit (INDEPENDENT_AMBULATORY_CARE_PROVIDER_SITE_OTHER): Payer: 59 | Admitting: Physician Assistant

## 2023-03-29 ENCOUNTER — Encounter: Payer: Self-pay | Admitting: Oncology

## 2023-03-29 ENCOUNTER — Encounter: Payer: Self-pay | Admitting: Physician Assistant

## 2023-03-29 VITALS — BP 119/76 | HR 74 | Temp 98.1°F | Ht 72.0 in | Wt 173.2 lb

## 2023-03-29 DIAGNOSIS — N492 Inflammatory disorders of scrotum: Secondary | ICD-10-CM

## 2023-03-29 MED ORDER — DOXYCYCLINE HYCLATE 100 MG PO CAPS: 100.0000 mg | ORAL_CAPSULE | Freq: Two times a day (BID) | ORAL | 0 refills | Status: AC

## 2023-03-29 NOTE — Progress Notes (Signed)
03/29/2023 9:53 AM   Harold Smith [birth year] [id number]  CC: Chief Complaint  Patient presents with   Other    ABSCESS ON SCROTUM   HPI: Harold Smith is a 57 y.o. male with PMH BPH not on pharmacotherapy, microscopic hematuria with benign workup in 2023, drug-induced priapism, and left scrotal lipoma who presents today for evaluation of scrotal abscess.   Today he reports his previously noted left scrotal lipoma became acutely inflamed and tender last week and started spontaneously draining malodorous, purulent discharge.  He denies diffuse scrotal edema or fevers.  PMH: Past Medical History:  Diagnosis Date   Allergic rhinitis due to pollen    Anxiety    Chronic pain    Colon polyps    Depression    Diverticulitis large intestine    GERD (gastroesophageal reflux disease)    HTN (hypertension)    Irritable bowel syndrome    Metabolic bone disease [id number]   Obesity    Osteoarthritis, knee    Sleep apnea    does not use cpap, pt states he no longer needed CPAP due to weight loss   Sleep disturbance    Vitamin D deficiency 09/01/2022    Surgical History: Past Surgical History:  Procedure Laterality Date   ABDOMINAL EXPOSURE N/A 03/06/2023   Procedure: ABDOMINAL EXPOSURE;  Surgeon: Marty Heck, MD;  Location: [place];  Service: Vascular;  Laterality: N/A;   ACHILLES TENDON REPAIR Right 12/26/2006   ANTERIOR LUMBAR FUSION N/A 03/06/2023   Procedure: L5-S1 ANTERIOR LUMBAR FUSION 1 LEVEL;  Surgeon: Callie Fielding, MD;  Location: [place];  Service: Orthopedics;  Laterality: N/A;   CATARACT EXTRACTION W/PHACO Left 07/20/2021   Procedure: CATARACT EXTRACTION PHACO AND INTRAOCULAR LENS PLACEMENT (IOC) LEFT 2.11 00:24.0;  Surgeon: Birder Robson, MD;  Location: [place];  Service: Ophthalmology;  Laterality: Left;  sleep apnea   CATARACT EXTRACTION W/PHACO Right 08/03/2021   Procedure: CATARACT EXTRACTION PHACO AND INTRAOCULAR LENS PLACEMENT  (IOC) RIGHT;  Surgeon: Birder Robson, MD;  Location: [place];  Service: Ophthalmology;  Laterality: Right;  3.19 0:33.0   CHOLECYSTECTOMY     COLON RESECTION Left 12/26/2004   due to diverticular disease at [place]  2013?   COLONOSCOPY N/A 11/30/2022   Procedure: COLONOSCOPY;  Surgeon: Lin Landsman, MD;  Location: [facility] ENDOSCOPY;  Service: Gastroenterology;  Laterality: N/A;   COLONOSCOPY WITH PROPOFOL N/A 05/22/2018   Procedure: COLONOSCOPY WITH PROPOFOL;  Surgeon: Lin Landsman, MD;  Location: [facility] ENDOSCOPY;  Service: Gastroenterology;  Laterality: N/A;   COLONOSCOPY WITH PROPOFOL N/A 10/22/2020   Procedure: COLONOSCOPY WITH PROPOFOL;  Surgeon: Lin Landsman, MD;  Location: [place];  Service: Endoscopy;  Laterality: N/A;  priority 4   COLONOSCOPY WITH PROPOFOL N/A 11/10/2021   Procedure: COLONOSCOPY WITH PROPOFOL;  Surgeon: Lin Landsman, MD;  Location: [facility] ENDOSCOPY;  Service: Gastroenterology;  Laterality: N/A;   COLONOSCOPY WITH PROPOFOL N/A 06/16/2022   Procedure: COLONOSCOPY WITH PROPOFOL;  Surgeon: Lin Landsman, MD;  Location: [place];  Service: Endoscopy;  Laterality: N/A;   COLONOSCOPY WITH PROPOFOL N/A 11/29/2022   Procedure: COLONOSCOPY WITH PROPOFOL;  Surgeon: Lin Landsman, MD;  Location: [facility] ENDOSCOPY;  Service: Gastroenterology;  Laterality: N/A;   DRUG INDUCED ENDOSCOPY N/A 06/10/2019   Procedure: DRUG INDUCED SLEEP ENDOSCOPY;  Surgeon: Jerrell Belfast, MD;  Location: [place];  Service: ENT;  Laterality: N/A;   ESOPHAGOGASTRODUODENOSCOPY (EGD) WITH PROPOFOL N/A 06/16/2022  Procedure: ESOPHAGOGASTRODUODENOSCOPY (EGD) WITH PROPOFOL;  Surgeon: Lin Landsman, MD;  Location: [place];  Service: Endoscopy;  Laterality: N/A;   GASTRIC BYPASS N/A 12/27/2007   HIATAL HERNIA REPAIR  09/25/2014   at [facility]   ORIF FEMUR FRACTURE Right 08/30/2022   Procedure:  OPEN REDUCTION INTERNAL FIXATION (ORIF) DISTAL FEMUR FRACTURE;  Surgeon: Altamese Hindsboro, MD;  Location: [place];  Service: Orthopedics;  Laterality: Right;   POLYPECTOMY  10/22/2020   Procedure: POLYPECTOMY;  Surgeon: Lin Landsman, MD;  Location: [place];  Service: Endoscopy;;   POLYPECTOMY  06/16/2022   Procedure: POLYPECTOMY;  Surgeon: Lin Landsman, MD;  Location: [place];  Service: Endoscopy;;   ROTATOR CUFF REPAIR Right    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 If you have any questions, ask your nurse or doctor.          Acetaminophen Extra Strength 500 MG Tabs Commonly known as: TYLENOL Take 2 tablets (1,000 mg total) by mouth every 8 (eight) hours for 21 days.   alendronate 70 MG tablet Commonly known as: Fosamax Take 1 tablet (70 mg total) by mouth once a week. Take with a full glass of water on an empty stomach.   CALCIUM PO Take 1 tablet by mouth daily.   cetirizine 10 MG tablet Commonly known as: ZYRTEC Take 10 mg by mouth daily.   Cholecalciferol 125 MCG (5000 UT) Tabs Take 1 tablet by mouth daily. What changed: how much to take   dicyclomine 10 MG capsule Commonly known as: BENTYL Take 1 to 2 capsules as needed for abdominal cramps and diarrhea every 6 to 8 hours What changed:  how much to take how to take this when to take this reasons to take this additional instructions   DULoxetine 60 MG capsule Commonly known as: CYMBALTA Take 60 mg by mouth daily.   FeroSul 325 (65 FE) MG tablet Generic drug: ferrous sulfate TAKE 1 TABLET BY MOUTH DAILY WITH BREAKFAST What changed:  how much to take when to take this   fluticasone 50 MCG/ACT nasal spray Commonly known as: FLONASE Place 2 sprays into both nostrils daily. What changed:  when to take this reasons to take  this   gabapentin 300 MG capsule Commonly known as: NEURONTIN Take 1 capsule twice daily and 4 caps at bedtime What changed:  how much to take how to take this when to take this reasons to take this additional instructions   hydrochlorothiazide 25 MG tablet Commonly known as: HYDRODIURIL Take 1 tablet (25 mg total) by mouth daily.   hydrocortisone 25 MG suppository Commonly known as: ANUSOL-HC Place 1 suppository (25 mg total) rectally daily.   lipase/protease/amylase 36000 UNITS Cpep capsule Commonly known as: Creon Take 2 capsules (72,000 Units total) by mouth 3 (three) times daily with meals. May also take 1 capsule (36,000 Units total) as needed (with snacks). What changed: See the new instructions.   lisinopril 20 MG tablet Commonly known as: ZESTRIL Take 1 tablet (20 mg total) by mouth daily.   multivitamin tablet Take 1 tablet by mouth daily.   omeprazole 40 MG capsule Commonly known as: PRILOSEC TAKE 1 CAPSULE BY MOUTH TWO TIMES DAILY BEFORE A MEAL What changed: See the new instructions.  polyethylene glycol powder 17 GM/SCOOP powder Commonly known as: GLYCOLAX/MIRALAX Take 1 capful (17 g) by mouth daily.   PREVIDENT 5000 BOOSTER PLUS DT Place 1 application  onto teeth 2 (two) times daily.   QUEtiapine 25 MG tablet Commonly known as: SEROQUEL Take 3 tablets (75 mg total) by mouth at bedtime. What changed:  how much to take when to take this reasons to take this   senna 8.6 MG Tabs tablet Commonly known as: SENOKOT Take 1 tablet (8.6 mg total) by mouth in the morning and at bedtime.   sildenafil 20 MG tablet Commonly known as: REVATIO Take 3-5 tablets as needed 1 hour prior to intercourse   traZODone 50 MG tablet Commonly known as: DESYREL Take 50 mg by mouth at bedtime as needed for sleep.   VITAMIN B-12 PO Take 1 tablet by mouth daily.   Zinc Sulfate 220 (50 Zn) MG Tabs Take 1 tablet (220 mg total) by mouth daily.   zolpidem 12.5 MG CR  tablet Commonly known as: AMBIEN CR TAKE ONE TABLET AT BEDTIME AS NEEDED FORFOR SLEEP What changed:  how much to take how to take this when to take this reasons to take this additional instructions        Allergies:  No Known Allergies  Family History: Family History  Problem Relation Age of Onset   Hypertension Mother    Diabetes Mother    Kidney disease Mother    Heart disease Mother    Colon cancer Father 79   Asthma Sister    Obesity Sister    Stroke Sister    Obesity Brother    Diabetes Other    Heart disease Other    Hypertension Other    Kidney disease Other    Cancer Paternal Uncle        unk type, possible prostate   Lung cancer Paternal Grandmother     Social History:   reports that he has been smoking cigars. He has been exposed to tobacco smoke. He has never used smokeless tobacco. He reports that he does not currently use alcohol. He reports that he does not use drugs.  Physical Exam: BP 119/76   Pulse 74   Temp 98.1 F (36.7 C)   Ht 6' (1.829 m)   Wt 173 lb 3.2 oz (78.6 kg)   BMI 23.49 kg/m   Constitutional:  Alert and oriented, no acute distress, nontoxic appearing HEENT: [place], AT Cardiovascular: No clubbing, cyanosis, or edema Respiratory: Normal respiratory effort, no increased work of breathing GU: Along the posterior left hemiscrotum, there is an approximate 2 cm fixed cutaneous nodule with minimal surrounding edema.  There is an open ~44mm sinus that is not spontaneously draining.  Sinus is able to be probed to a depth of about 6 mm, no additional drainage with wound probing or manipulation. Skin: No rashes, bruises or suspicious lesions Neurologic: Grossly intact, no focal deficits, moving all 4 extremities Psychiatric: Normal mood and affect  Assessment & Plan:   1. Scrotal abscess Previously identified left scrotal lipoma now with infection/abscess formation.  Fortunately, abscess has spontaneously drained and there is no active  drainage in clinic today.  Will treat with doxycycline to treat any residual underlying infection and see him back in clinic in 2 weeks for wound recheck.  He is very motivated to pursue resection of the underlying lipoma versus cyst due to concerns for recurrent abscess formation. - doxycycline (VIBRAMYCIN) 100 MG capsule; Take 1 capsule (100 mg total) by  mouth 2 (two) times daily for 7 days.  Dispense: 14 capsule; Refill: 0   Return in about 2 weeks (around 04/12/2023) for Wound check with Dr. Erlene Quan.  Debroah Loop, PA-C  [facility] [place] [address] [place], [place] [address] [phone]

## 2023-03-30 ENCOUNTER — Encounter: Payer: Self-pay | Admitting: Orthopedic Surgery

## 2023-03-30 ENCOUNTER — Encounter: Payer: Self-pay | Admitting: Internal Medicine

## 2023-03-30 DIAGNOSIS — Z961 Presence of intraocular lens: Secondary | ICD-10-CM | POA: Diagnosis not present

## 2023-03-30 DIAGNOSIS — H43813 Vitreous degeneration, bilateral: Secondary | ICD-10-CM | POA: Diagnosis not present

## 2023-03-30 DIAGNOSIS — H359 Unspecified retinal disorder: Secondary | ICD-10-CM | POA: Diagnosis not present

## 2023-03-31 ENCOUNTER — Telehealth: Payer: Self-pay | Admitting: Internal Medicine

## 2023-03-31 MED ORDER — OXYCODONE HCL 5 MG PO TABS: 5.0000 mg | ORAL_TABLET | ORAL | 0 refills | Status: AC | PRN

## 2023-03-31 MED ORDER — ZOLPIDEM TARTRATE ER 12.5 MG PO TBCR: EXTENDED_RELEASE_TABLET | ORAL | 0 refills | Status: DC

## 2023-03-31 MED ORDER — ZOLPIDEM TARTRATE 10 MG PO TABS: 10.0000 mg | ORAL_TABLET | Freq: Every evening | ORAL | 0 refills | Status: DC | PRN

## 2023-03-31 NOTE — Telephone Encounter (Signed)
Pharmacy called the office letting us know prescription zolpidem (AMBIEN CR) 12.5 MG CR tablet  is on long term back order, stated it was the "CR" version specifically that was on backorder. Wanted to know if Dr. Alphonsus Sias would like to send in a different one.

## 2023-03-31 NOTE — Telephone Encounter (Signed)
New rx sent for regular zolpidem

## 2023-04-12 ENCOUNTER — Other Ambulatory Visit: Payer: Self-pay | Admitting: Gastroenterology

## 2023-04-12 NOTE — Telephone Encounter (Signed)
Last office visit 10/12/2022 Plan: Erosive esophagitis Continue omeprazole 40 mg twice daily before meals Recommend repeat EGD to confirm healing if patient is agreeable  EGD 06/16/2022 did not repeat on recommendations of 10/12/2022  Last refill 01/02/2023 0 refills 60 capsules

## 2023-04-13 ENCOUNTER — Other Ambulatory Visit: Payer: Self-pay | Admitting: Internal Medicine

## 2023-04-14 ENCOUNTER — Inpatient Hospital Stay: Payer: 59 | Attending: Oncology

## 2023-04-14 DIAGNOSIS — Z79899 Other long term (current) drug therapy: Secondary | ICD-10-CM | POA: Insufficient documentation

## 2023-04-14 DIAGNOSIS — F17290 Nicotine dependence, other tobacco product, uncomplicated: Secondary | ICD-10-CM | POA: Insufficient documentation

## 2023-04-14 DIAGNOSIS — D509 Iron deficiency anemia, unspecified: Secondary | ICD-10-CM | POA: Diagnosis not present

## 2023-04-14 DIAGNOSIS — D508 Other iron deficiency anemias: Secondary | ICD-10-CM

## 2023-04-14 LAB — CBC WITH DIFFERENTIAL/PLATELET
Abs Immature Granulocytes: 0.04 10*3/uL (ref 0.00–0.07)
Basophils Absolute: 0 10*3/uL (ref 0.0–0.1)
Basophils Relative: 1 %
Eosinophils Absolute: 0.3 10*3/uL (ref 0.0–0.5)
Eosinophils Relative: 4 %
HCT: 39.9 % (ref 39.0–52.0)
Hemoglobin: 13 g/dL (ref 13.0–17.0)
Immature Granulocytes: 1 %
Lymphocytes Relative: 21 %
Lymphs Abs: 1.4 10*3/uL (ref 0.7–4.0)
MCH: 32.5 pg (ref 26.0–34.0)
MCHC: 32.6 g/dL (ref 30.0–36.0)
MCV: 99.8 fL (ref 80.0–100.0)
Monocytes Absolute: 0.8 10*3/uL (ref 0.1–1.0)
Monocytes Relative: 11 %
Neutro Abs: 4.4 10*3/uL (ref 1.7–7.7)
Neutrophils Relative %: 62 %
Platelets: 262 10*3/uL (ref 150–400)
RBC: 4 MIL/uL — ABNORMAL LOW (ref 4.22–5.81)
RDW: 14.6 % (ref 11.5–15.5)
WBC: 6.9 10*3/uL (ref 4.0–10.5)
nRBC: 0 % (ref 0.0–0.2)

## 2023-04-14 LAB — IRON AND TIBC
Iron: 73 ug/dL (ref 45–182)
Saturation Ratios: 18 % (ref 17.9–39.5)
TIBC: 399 ug/dL (ref 250–450)
UIBC: 326 ug/dL

## 2023-04-14 LAB — RETIC PANEL
Immature Retic Fract: 11.1 % (ref 2.3–15.9)
RBC.: 3.96 MIL/uL — ABNORMAL LOW (ref 4.22–5.81)
Retic Count, Absolute: 71.3 10*3/uL (ref 19.0–186.0)
Retic Ct Pct: 1.8 % (ref 0.4–3.1)
Reticulocyte Hemoglobin: 34 pg (ref >27.9)

## 2023-04-14 LAB — FERRITIN: Ferritin: 112 ng/mL (ref 24–336)

## 2023-04-17 ENCOUNTER — Other Ambulatory Visit (INDEPENDENT_AMBULATORY_CARE_PROVIDER_SITE_OTHER): Payer: 59

## 2023-04-17 ENCOUNTER — Ambulatory Visit (INDEPENDENT_AMBULATORY_CARE_PROVIDER_SITE_OTHER): Payer: 59 | Admitting: Orthopedic Surgery

## 2023-04-17 DIAGNOSIS — Z981 Arthrodesis status: Secondary | ICD-10-CM

## 2023-04-17 NOTE — Progress Notes (Signed)
Orthopedic Surgery Post-operative Office Visit   Procedure: L5/S1 ALIF and PSIF Date of Surgery: 03/06/2023 (~6 weeks post-op)   Assessment: Patient is a 57 y.o. who is doing well after L5/S1 ALIF and PSIF     Plan: -Operative plans complete -Out of bed as tolerated, no brace -Okay to submerge wounds at this point -No bending/lifting/twisting greater than 10 pounds -Pain management: OTC medications -Return to office in 6 weeks, lumbar x-rays needed at next visit: AP/lateral/flex/ex lumbar   ___________________________________________________________________________     Subjective: Patient had back spasms about 2 weeks ago. He said this was quite painful, but it resolved within a couple of days time. He reports that his back pain is slightly better than before surgery, but he still feels it. He said he has more good days now as opposed to bad days when it comes to back pain when compared to pre-op. His leg pain has completely resolved. He has not had any recurrence of the pain radiating down his legs. No paresthesias and numbness. Has not noticed any redness or drainage around his incisions.    Objective:   General: no acute distress, appropriate affect Neurologic: alert, answering questions appropriately, following commands Respiratory: unlabored breathing on room air Skin: Both incisions are well healed with no erythema, induration, or active/expressible drainage. Posterior hematoma has resolved   MSK (spine):   -Strength exam                                                   Left                  Right   EHL                              5/5                  5/5 TA                                 5/5                  5/5 GSC                             5/5                  5/5 Knee extension            5/5                  5/5 Hip flexion                    5/5                  5/5   -Sensory exam                           Sensation intact to light touch in L3-S1 nerve  distributions of bilateral lower extremities   Imaging: X-rays of the lumbar spine taken today were independently reviewed and interpreted, showing posterior instrumentation with no lucency. Screws have not backed out. ALIF cage  is in place with no lucency around it. No subsidence seen. Cage is in same position as immediate post-operative films from 03/06/2023.      Patient name: Harold Smith Patient MRN: [id number] Date of visit: 04/17/23

## 2023-04-18 ENCOUNTER — Other Ambulatory Visit (HOSPITAL_COMMUNITY): Payer: Self-pay

## 2023-04-19 ENCOUNTER — Ambulatory Visit (INDEPENDENT_AMBULATORY_CARE_PROVIDER_SITE_OTHER): Payer: 59 | Admitting: Urology

## 2023-04-19 VITALS — BP 120/83 | HR 82 | Ht 72.0 in | Wt 173.2 lb

## 2023-04-19 DIAGNOSIS — N492 Inflammatory disorders of scrotum: Secondary | ICD-10-CM

## 2023-04-19 DIAGNOSIS — N529 Male erectile dysfunction, unspecified: Secondary | ICD-10-CM

## 2023-04-19 DIAGNOSIS — M25561 Pain in right knee: Secondary | ICD-10-CM | POA: Diagnosis not present

## 2023-04-19 DIAGNOSIS — M11261 Other chondrocalcinosis, right knee: Secondary | ICD-10-CM | POA: Diagnosis not present

## 2023-04-19 DIAGNOSIS — S72461D Displaced supracondylar fracture with intracondylar extension of lower end of right femur, subsequent encounter for closed fracture with routine healing: Secondary | ICD-10-CM | POA: Diagnosis not present

## 2023-04-19 MED ORDER — TADALAFIL 20 MG PO TABS: 20.0000 mg | ORAL_TABLET | Freq: Every day | ORAL | 11 refills | Status: DC | PRN

## 2023-04-19 NOTE — Progress Notes (Signed)
Marcelle Overlie Plume,acting as a scribe for Vanna Scotland, MD.,have documented all relevant documentation on the behalf of Vanna Scotland, MD,as directed by  Vanna Scotland, MD while in the presence of Vanna Scotland, MD.  04/19/2023 2:46 PM   Harold Smith 1966/12/23 [id number]  Referring provider: Karie Schwalbe, MD [address] [place],  [place] [address]  Chief Complaint  Patient presents with   Follow-up    Wound check    HPI: 57 year-old male who was seen and evaluated by Carman Ching 2 weeks ago at which time he was having a scrotal abscess that was already spontaneously draining. He was placed on doxycycline for 2 weeks.   Today, he reports significant improvement, stating that the abscess had completely resolved, leaving no noticeable trace. He described the initial condition as a swollen area on his scrotum, about the size of his thumbnail, which eventually produced pus and had a foul odor. The patient expressed relief that surgical intervention was not necessary, as he had feared during his last visit.   Additionally, he brought up concerns regarding erectile dysfunction, mentioning a history of priapism attributed to medication, which resulted in a prolonged erection lasting approximately eight to nine hours. He has been prescribed sildenafil without satisfactory results and expressed interest in trying tadalafil.    PMH: Past Medical History:  Diagnosis Date   Allergic rhinitis due to pollen    Anxiety    Chronic pain    Colon polyps    Depression    Diverticulitis large intestine    GERD (gastroesophageal reflux disease)    HTN (hypertension)    Irritable bowel syndrome    Metabolic bone disease 09/01/2022   Obesity    Osteoarthritis, knee    Sleep apnea    does not use cpap, pt states he no longer needed CPAP due to weight loss   Sleep disturbance    Vitamin D deficiency 09/01/2022    Surgical History: Past Surgical History:   Procedure Laterality Date   ABDOMINAL EXPOSURE N/A 03/06/2023   Procedure: ABDOMINAL EXPOSURE;  Surgeon: Cephus Shelling, MD;  Location: [facility] OR;  Service: Vascular;  Laterality: N/A;   ACHILLES TENDON REPAIR Right 12/26/2006   ANTERIOR LUMBAR FUSION N/A 03/06/2023   Procedure: L5-S1 ANTERIOR LUMBAR FUSION 1 LEVEL;  Surgeon: London Sheer, MD;  Location: [facility] OR;  Service: Orthopedics;  Laterality: N/A;   CATARACT EXTRACTION W/PHACO Left 07/20/2021   Procedure: CATARACT EXTRACTION PHACO AND INTRAOCULAR LENS PLACEMENT (IOC) LEFT 2.11 00:24.0;  Surgeon: Galen Manila, MD;  Location: [facility];  Service: Ophthalmology;  Laterality: Left;  sleep apnea   CATARACT EXTRACTION W/PHACO Right 08/03/2021   Procedure: CATARACT EXTRACTION PHACO AND INTRAOCULAR LENS PLACEMENT (IOC) RIGHT;  Surgeon: Galen Manila, MD;  Location: [facility];  Service: Ophthalmology;  Laterality: Right;  3.19 0:33.0   CHOLECYSTECTOMY     COLON RESECTION Left 12/26/2004   due to diverticular disease at [facility]   COLONOSCOPY  2013?   COLONOSCOPY N/A 11/30/2022   Procedure: COLONOSCOPY;  Surgeon: Toney Reil, MD;  Location: [facility] ENDOSCOPY;  Service: Gastroenterology;  Laterality: N/A;   COLONOSCOPY WITH PROPOFOL N/A 05/22/2018   Procedure: COLONOSCOPY WITH PROPOFOL;  Surgeon: Toney Reil, MD;  Location: [facility] ENDOSCOPY;  Service: Gastroenterology;  Laterality: N/A;   COLONOSCOPY WITH PROPOFOL N/A 10/22/2020   Procedure: COLONOSCOPY WITH PROPOFOL;  Surgeon: Toney Reil, MD;  Location: [facility];  Service: Endoscopy;  Laterality: N/A;  priority 4   COLONOSCOPY WITH PROPOFOL N/A 11/10/2021   Procedure: COLONOSCOPY WITH PROPOFOL;  Surgeon: Toney Reil, MD;  Location: [facility] ENDOSCOPY;  Service: Gastroenterology;  Laterality: N/A;   COLONOSCOPY WITH PROPOFOL N/A 06/16/2022   Procedure: COLONOSCOPY WITH PROPOFOL;  Surgeon: Toney Reil, MD;  Location: [facility];  Service: Endoscopy;  Laterality: N/A;   COLONOSCOPY WITH PROPOFOL N/A 11/29/2022   Procedure: COLONOSCOPY WITH PROPOFOL;  Surgeon: Toney Reil, MD;  Location: [facility] ENDOSCOPY;  Service: Gastroenterology;  Laterality: N/A;   DRUG INDUCED ENDOSCOPY N/A 06/10/2019   Procedure: DRUG INDUCED SLEEP ENDOSCOPY;  Surgeon: Osborn Coho, MD;  Location: [facility];  Service: ENT;  Laterality: N/A;   ESOPHAGOGASTRODUODENOSCOPY (EGD) WITH PROPOFOL N/A 06/16/2022   Procedure: ESOPHAGOGASTRODUODENOSCOPY (EGD) WITH PROPOFOL;  Surgeon: Toney Reil, MD;  Location: [facility];  Service: Endoscopy;  Laterality: N/A;   GASTRIC BYPASS N/A 12/27/2007   HIATAL HERNIA REPAIR  09/25/2014   at [facility]   ORIF FEMUR FRACTURE Right 08/30/2022   Procedure: OPEN REDUCTION INTERNAL FIXATION (ORIF) DISTAL FEMUR FRACTURE;  Surgeon: Myrene Galas, MD;  Location: [facility] OR;  Service: Orthopedics;  Laterality: Right;   POLYPECTOMY  10/22/2020   Procedure: POLYPECTOMY;  Surgeon: Toney Reil, MD;  Location: [facility];  Service: Endoscopy;;   POLYPECTOMY  06/16/2022   Procedure: POLYPECTOMY;  Surgeon: Toney Reil, MD;  Location: [facility];  Service: Endoscopy;;   ROTATOR CUFF REPAIR Right    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 If you have any questions, ask your nurse or doctor.          alendronate 70 MG tablet Commonly known as: Fosamax Take 1 tablet (70 mg total) by mouth once a week. Take with a full glass of water on an empty stomach.   CALCIUM PO Take 1 tablet by mouth daily.   cetirizine 10 MG tablet Commonly known as: ZYRTEC Take 10 mg by mouth daily.   Cholecalciferol 125 MCG (5000 UT) Tabs Take 1 tablet by mouth daily. What changed: how much to take   clindamycin  300 MG capsule Commonly known as: CLEOCIN Take by mouth.   dicyclomine 10 MG capsule Commonly known as: BENTYL Take 1 to 2 capsules as needed for abdominal cramps and diarrhea every 6 to 8 hours What changed:  how much to take how to take this when to take this reasons to take this additional instructions   DULoxetine 60 MG capsule Commonly known as: CYMBALTA Take 60 mg by mouth daily.   FeroSul 325 (65 FE) MG tablet Generic drug: ferrous sulfate TAKE 1 TABLET BY MOUTH DAILY WITH BREAKFAST What changed:  how much to take when to take this   fluticasone 50 MCG/ACT nasal spray Commonly known as: FLONASE Place 2 sprays into both nostrils daily. What changed:  when to take this reasons to take this   gabapentin 300 MG capsule Commonly known as: NEURONTIN TAKE 1 CAPSULE BY MOUTH TWICE DAILY AND TAKE 4 CAPSULES AT BEDTIME   hydrochlorothiazide 25 MG tablet Commonly known as: HYDRODIURIL Take 1 tablet (25 mg total) by mouth daily.   hydrocortisone 25 MG suppository Commonly known as: ANUSOL-HC Place 1 suppository (25 mg total) rectally daily.  lipase/protease/amylase 40981 UNITS Cpep capsule Commonly known as: Creon Take 2 capsules (72,000 Units total) by mouth 3 (three) times daily with meals. May also take 1 capsule (36,000 Units total) as needed (with snacks). What changed: See the new instructions.   lisinopril 20 MG tablet Commonly known as: ZESTRIL Take 1 tablet (20 mg total) by mouth daily.   meloxicam 15 MG tablet Commonly known as: MOBIC Take 15 mg by mouth daily.   multivitamin tablet Take 1 tablet by mouth daily.   omeprazole 40 MG capsule Commonly known as: PRILOSEC TAKE 1 CAPSULE BY MOUTH TWO TIMES DAILY BEFORE A MEAL   polyethylene glycol powder 17 GM/SCOOP powder Commonly known as: GLYCOLAX/MIRALAX Take 1 capful (17 g) by mouth daily.   PREVIDENT 5000 BOOSTER PLUS DT Place 1 application  onto teeth 2 (two) times daily.   QUEtiapine 25 MG  tablet Commonly known as: SEROQUEL Take 3 tablets (75 mg total) by mouth at bedtime. What changed:  how much to take when to take this reasons to take this   senna 8.6 MG Tabs tablet Commonly known as: SENOKOT Take 1 tablet (8.6 mg total) by mouth in the morning and at bedtime.   sildenafil 20 MG tablet Commonly known as: REVATIO Take 3-5 tablets as needed 1 hour prior to intercourse   tadalafil 20 MG tablet Commonly known as: Cialis Take 1 tablet (20 mg total) by mouth daily as needed for erectile dysfunction.   traZODone 50 MG tablet Commonly known as: DESYREL Take 50 mg by mouth at bedtime as needed for sleep.   VITAMIN B-12 PO Take 1 tablet by mouth daily.   Zinc Sulfate 220 (50 Zn) MG Tabs Take 1 tablet (220 mg total) by mouth daily.   zolpidem 10 MG tablet Commonly known as: AMBIEN Take 1 tablet (10 mg total) by mouth at bedtime as needed for sleep.        Family History: Family History  Problem Relation Age of Onset   Hypertension Mother    Diabetes Mother    Kidney disease Mother    Heart disease Mother    Colon cancer Father 20   Asthma Sister    Obesity Sister    Stroke Sister    Obesity Brother    Diabetes Other    Heart disease Other    Hypertension Other    Kidney disease Other    Cancer Paternal Uncle        unk type, possible prostate   Lung cancer Paternal Grandmother     Social History:  reports that he has been smoking cigars. He has been exposed to tobacco smoke. He has never used smokeless tobacco. He reports that he does not currently use alcohol. He reports that he does not use drugs.   Physical Exam: BP 120/83   Pulse 82   Ht 6' (1.829 m)   Wt 173 lb 3.2 oz (78.6 kg)   BMI 23.49 kg/m   Constitutional:  Alert and oriented, No acute distress. HEENT: [place] AT, moist mucus membranes.  Trachea midline, no masses. GU:  Scrotum revealed a small sinus on the left posterior lateral aspect, appearing to be healing. No fluctuance,  drainage, edema, or other concerning findings were noted. Neurologic: Grossly intact, no focal deficits, moving all 4 extremities. Psychiatric: Normal mood and affect.   Assessment & Plan:    1. Scrotal abscess - Previously noted scrotal abscess has spontaneously drained and resolved following a course of doxycycline. The area is well-healed,  with no current signs of infection or complications. - No further intervention is recommended at this time.  2. Erectile dysfunction - Plan to discontinue sildenafil and initiate tadalafil 20 mg - Advised that tadalafil may take two to three hours to become effective and has a longer duration of action.  - Follow-up as needed to assess response to tadalafil and discuss further treatment options if necessary, including the possibility of injections or an implant - We discussed the pathophysiology of erectile dysfunction today along with possible contributing factors. Discussed possible treatment options including PDE 5 inhibitors, vacuum erectile device, intracavernosal injection, MUSE, and placement of the inflatable or malleable penile prosthesis for refractory cases.  In terms of PDE 5 inhibitors, we discussed contraindications for this medication as well as common side effects. Patient was counseled on optimal use. All of his questions were answered in detail.   Return if symptoms worsen or fail to improve.  I have reviewed the above documentation for accuracy and completeness, and I agree with the above.   Vanna Scotland, MD   [facility] [address] [place], [place] [address] [phone]

## 2023-04-21 ENCOUNTER — Encounter: Payer: Self-pay | Admitting: Oncology

## 2023-04-21 ENCOUNTER — Inpatient Hospital Stay (HOSPITAL_BASED_OUTPATIENT_CLINIC_OR_DEPARTMENT_OTHER): Payer: 59 | Admitting: Oncology

## 2023-04-21 VITALS — BP 119/93 | HR 108 | Temp 97.0°F | Resp 18 | Wt 175.0 lb

## 2023-04-21 DIAGNOSIS — D508 Other iron deficiency anemias: Secondary | ICD-10-CM | POA: Diagnosis not present

## 2023-04-21 DIAGNOSIS — Z79899 Other long term (current) drug therapy: Secondary | ICD-10-CM | POA: Diagnosis not present

## 2023-04-21 DIAGNOSIS — F17290 Nicotine dependence, other tobacco product, uncomplicated: Secondary | ICD-10-CM | POA: Diagnosis not present

## 2023-04-21 DIAGNOSIS — D509 Iron deficiency anemia, unspecified: Secondary | ICD-10-CM | POA: Diagnosis not present

## 2023-04-21 NOTE — Assessment & Plan Note (Addendum)
#  Iron deficiency anemia, previously tolerated IV Venofer treatments. Labs reviewed and discussed with patient Lab Results  Component Value Date   IRON 73 04/14/2023   TIBC 399 04/14/2023   IRONPCTSAT 18 04/14/2023   FERRITIN 112 04/14/2023   HGB 13.0 04/14/2023    No need for IV Venofer. He may stop oral iron supplementation. Plan repeat labs in 6 months.

## 2023-04-21 NOTE — Progress Notes (Signed)
Hematology/Oncology Progress note [phone] [phone]        CHIEF COMPLAINTS/REASON FOR VISIT:  Follow-up for iron deficiency anemia.   ASSESSMENT & PLAN:   Iron deficiency anemia #Iron deficiency anemia, previously tolerated IV Venofer treatments. Labs reviewed and discussed with patient Lab Results  Component Value Date   IRON 73 04/14/2023   TIBC 399 04/14/2023   IRONPCTSAT 18 04/14/2023   FERRITIN 112 04/14/2023   HGB 13.0 04/14/2023    No need for IV Venofer. He may stop oral iron supplementation. Plan repeat labs in 6 months.     Orders Placed This Encounter  Procedures   CBC with Differential (Cancer Center Only)    Standing Status:   Future    Standing Expiration Date:   04/20/2024   Iron and TIBC    Standing Status:   Future    Standing Expiration Date:   04/20/2024   Ferritin    Standing Status:   Future    Standing Expiration Date:   04/20/2024   Retic Panel    Standing Status:   Future    Standing Expiration Date:   04/20/2024   Follow up in 6 months.  All questions were answered. The patient knows to call the clinic with any problems, questions or concerns.  Rickard Patience, MD, PhD [facility] Hematology Oncology 04/21/2023     HISTORY OF PRESENTING ILLNESS:   Domenico Achord is a  57 y.o.  male with PMH listed below was seen in consultation at the request of  Tillman Abide I, MD  for evaluation of anemia  05/03/2022, patient had preop blood work done showed hemoglobin of 8.3, hematocrit 75.  Platelet count 258, white count 8.5.  Patient had plan of back surgery which was aborted due to decreased hemoglobin. Reviewed his previous lab records 01/30/2022, hemoglobin 10, MCV 88.6, 07/02/2021, hemoglobin 11, MCV 90.2. Patient denies any black or bloody stool.  Denies any unintentional weight loss, night sweats, fever, epigastric discomfort. Patient takes Mobic as needed for pain. 11/10/2021, colonoscopy showed 2 small polyps in the  proximal transverse colon.  Pathology showed tubular adenoma. Family history is positive for father who passed away from colorectal cancer at age of 73.  11/30/22 colonoscopy showed  Six 3 to 6 mm polyps in the descending colon, in the transverse colon and in the cecum, removed with a cold snare. Resected and retrieved.- The distal rectum and anal verge are normal on retroflexion view Pathology showed multiple tubular adenoma, hyperplastic polyp. Negative for dysplasia and malignancy  .  INTERVAL HISTORY Ayyan Claron Rosencrans is a 57 y.o. male who has above history reviewed by me today presents for follow up visit for iron deficiency anemia. She tolerates IV Venofer treatments, he tolerates well.  Fatigue has resolved. He takes oral iron supplementation.   Review of Systems  Constitutional:  Negative for appetite change, chills, fatigue, fever and unexpected weight change.  HENT:   Negative for hearing loss and voice change.   Eyes:  Negative for eye problems and icterus.  Respiratory:  Negative for chest tightness, cough and shortness of breath.   Cardiovascular:  Negative for chest pain and leg swelling.  Gastrointestinal:  Negative for abdominal distention and abdominal pain.  Endocrine: Negative for hot flashes.  Genitourinary:  Negative for difficulty urinating, dysuria and frequency.   Musculoskeletal:  Positive for back pain. Negative for arthralgias.  Skin:  Negative for itching and rash.  Neurological:  Negative for light-headedness and numbness.  Hematological:  Negative for adenopathy. Does not bruise/bleed easily.  Psychiatric/Behavioral:  Negative for confusion.     MEDICAL HISTORY:  Past Medical History:  Diagnosis Date   Allergic rhinitis due to pollen    Anxiety    Chronic pain    Colon polyps    Depression    Diverticulitis large intestine    GERD (gastroesophageal reflux disease)    HTN (hypertension)    Irritable bowel syndrome    Metabolic bone disease  09/01/2022   Obesity    Osteoarthritis, knee    Sleep apnea    does not use cpap, pt states he no longer needed CPAP due to weight loss   Sleep disturbance    Vitamin D deficiency 09/01/2022    SURGICAL HISTORY: Past Surgical History:  Procedure Laterality Date   ABDOMINAL EXPOSURE N/A 03/06/2023   Procedure: ABDOMINAL EXPOSURE;  Surgeon: Cephus Shelling, MD;  Location: [facility] OR;  Service: Vascular;  Laterality: N/A;   ACHILLES TENDON REPAIR Right 12/26/2006   ANTERIOR LUMBAR FUSION N/A 03/06/2023   Procedure: L5-S1 ANTERIOR LUMBAR FUSION 1 LEVEL;  Surgeon: London Sheer, MD;  Location: [facility] OR;  Service: Orthopedics;  Laterality: N/A;   CATARACT EXTRACTION W/PHACO Left 07/20/2021   Procedure: CATARACT EXTRACTION PHACO AND INTRAOCULAR LENS PLACEMENT (IOC) LEFT 2.11 00:24.0;  Surgeon: Galen Manila, MD;  Location: [facility];  Service: Ophthalmology;  Laterality: Left;  sleep apnea   CATARACT EXTRACTION W/PHACO Right 08/03/2021   Procedure: CATARACT EXTRACTION PHACO AND INTRAOCULAR LENS PLACEMENT (IOC) RIGHT;  Surgeon: Galen Manila, MD;  Location: [facility];  Service: Ophthalmology;  Laterality: Right;  3.19 0:33.0   CHOLECYSTECTOMY     COLON RESECTION Left 12/26/2004   due to diverticular disease at [facility]   COLONOSCOPY  2013?   COLONOSCOPY N/A 11/30/2022   Procedure: COLONOSCOPY;  Surgeon: Toney Reil, MD;  Location: [facility] ENDOSCOPY;  Service: Gastroenterology;  Laterality: N/A;   COLONOSCOPY WITH PROPOFOL N/A 05/22/2018   Procedure: COLONOSCOPY WITH PROPOFOL;  Surgeon: Toney Reil, MD;  Location: [facility] ENDOSCOPY;  Service: Gastroenterology;  Laterality: N/A;   COLONOSCOPY WITH PROPOFOL N/A 10/22/2020   Procedure: COLONOSCOPY WITH PROPOFOL;  Surgeon: Toney Reil, MD;  Location: [facility];  Service: Endoscopy;  Laterality: N/A;  priority 4   COLONOSCOPY WITH PROPOFOL N/A 11/10/2021   Procedure: COLONOSCOPY WITH PROPOFOL;   Surgeon: Toney Reil, MD;  Location: [facility] ENDOSCOPY;  Service: Gastroenterology;  Laterality: N/A;   COLONOSCOPY WITH PROPOFOL N/A 06/16/2022   Procedure: COLONOSCOPY WITH PROPOFOL;  Surgeon: Toney Reil, MD;  Location: [facility];  Service: Endoscopy;  Laterality: N/A;   COLONOSCOPY WITH PROPOFOL N/A 11/29/2022   Procedure: COLONOSCOPY WITH PROPOFOL;  Surgeon: Toney Reil, MD;  Location: [facility] ENDOSCOPY;  Service: Gastroenterology;  Laterality: N/A;   DRUG INDUCED ENDOSCOPY N/A 06/10/2019   Procedure: DRUG INDUCED SLEEP ENDOSCOPY;  Surgeon: Osborn Coho, MD;  Location: [facility];  Service: ENT;  Laterality: N/A;   ESOPHAGOGASTRODUODENOSCOPY (EGD) WITH PROPOFOL N/A 06/16/2022   Procedure: ESOPHAGOGASTRODUODENOSCOPY (EGD) WITH PROPOFOL;  Surgeon: Toney Reil, MD;  Location: [facility];  Service: Endoscopy;  Laterality: N/A;   GASTRIC BYPASS N/A 12/27/2007   HIATAL HERNIA REPAIR  09/25/2014   at [facility]   ORIF FEMUR FRACTURE Right 08/30/2022   Procedure: OPEN REDUCTION INTERNAL FIXATION (ORIF) DISTAL FEMUR FRACTURE;  Surgeon: Myrene Galas, MD;  Location: [facility] OR;  Service: Orthopedics;  Laterality: Right;   POLYPECTOMY  10/22/2020   Procedure: POLYPECTOMY;  Surgeon: Toney Reil, MD;  Location: [facility];  Service: Endoscopy;;   POLYPECTOMY  06/16/2022   Procedure: POLYPECTOMY;  Surgeon: Toney Reil, MD;  Location: [facility];  Service: Endoscopy;;   ROTATOR CUFF REPAIR Right    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    SOCIAL HISTORY: Social History   Socioeconomic History   Marital status: Single    Spouse name: Not on file   Number of children: 3   Years of education: Not on file   Highest education level: Not on file  Occupational History   Occupation: Commercial truck driver  Tobacco Use   Smoking status: Some Days    Types: Cigars    Passive exposure: Past   Smokeless  tobacco: Never   Tobacco comments:    occasional cigar  Vaping Use   Vaping Use: Never used  Substance and Sexual Activity   Alcohol use: Not Currently   Drug use: No   Sexual activity: Yes  Other Topics Concern   Not on file  Social History Narrative   Not on file   Social Determinants of Health   Financial Resource Strain: Not on file  Food Insecurity: No Food Insecurity (03/06/2023)   Hunger Vital Sign    Worried About Running Out of Food in the Last Year: Never true    Ran Out of Food in the Last Year: Never true  Transportation Needs: No Transportation Needs (03/06/2023)   PRAPARE - Administrator, Civil Service (Medical): No    Lack of Transportation (Non-Medical): No  Physical Activity: Not on file  Stress: Not on file  Social Connections: Not on file  Intimate Partner Violence: Not At Risk (03/06/2023)   Humiliation, Afraid, Rape, and Kick questionnaire    Fear of Current or Ex-Partner: No    Emotionally Abused: No    Physically Abused: No    Sexually Abused: No    FAMILY HISTORY: Family History  Problem Relation Age of Onset   Hypertension Mother    Diabetes Mother    Kidney disease Mother    Heart disease Mother    Colon cancer Father 25   Asthma Sister    Obesity Sister    Stroke Sister    Obesity Brother    Diabetes Other    Heart disease Other    Hypertension Other    Kidney disease Other    Cancer Paternal Uncle        unk type, possible prostate   Lung cancer Paternal Grandmother     ALLERGIES:  has No Known Allergies.  MEDICATIONS:  Current Outpatient Medications  Medication Sig Dispense Refill   alendronate (FOSAMAX) 70 MG tablet Take 1 tablet (70 mg total) by mouth once a week. Take with a full glass of water on an empty stomach. 10 tablet 0   CALCIUM PO Take 1 tablet by mouth daily.     cetirizine (ZYRTEC) 10 MG tablet Take 10 mg by mouth daily.     Cholecalciferol 125 MCG (5000 UT) TABS Take 1 tablet by mouth daily. (Patient  taking differently: Take 5,000 Units by mouth daily.) 30 tablet 6   clindamycin (CLEOCIN) 300 MG capsule Take by mouth.     Cyanocobalamin (VITAMIN B-12 PO) Take 1 tablet by mouth daily.     dicyclomine (BENTYL) 10 MG capsule Take 1 to 2 capsules as needed for abdominal cramps and diarrhea every 6 to 8  hours (Patient taking differently: Take 10 mg by mouth as needed for spasms.) 240 capsule 3   DULoxetine (CYMBALTA) 60 MG capsule Take 60 mg by mouth daily.     FEROSUL 325 (65 Fe) MG tablet TAKE 1 TABLET BY MOUTH DAILY WITH BREAKFAST (Patient taking differently: Take 325 mg by mouth daily.) 30 tablet 11   fluticasone (FLONASE) 50 MCG/ACT nasal spray Place 2 sprays into both nostrils daily. (Patient taking differently: Place 2 sprays into both nostrils daily as needed for allergies or rhinitis.) 48 g 3   gabapentin (NEURONTIN) 300 MG capsule TAKE 1 CAPSULE BY MOUTH TWICE DAILY AND TAKE 4 CAPSULES AT BEDTIME 180 capsule 5   hydrochlorothiazide (HYDRODIURIL) 25 MG tablet Take 1 tablet (25 mg total) by mouth daily. 90 tablet 3   hydrocortisone (ANUSOL-HC) 25 MG suppository Place 1 suppository (25 mg total) rectally daily. 14 suppository 0   lipase/protease/amylase (CREON) 36000 UNITS CPEP capsule Take 2 capsules (72,000 Units total) by mouth 3 (three) times daily with meals. May also take 1 capsule (36,000 Units total) as needed (with snacks). (Patient taking differently: Take 36,000 units by mouth 3 (three) times daily with meals. ) 240 capsule 5   lisinopril (ZESTRIL) 20 MG tablet Take 1 tablet (20 mg total) by mouth daily. 90 tablet 3   meloxicam (MOBIC) 15 MG tablet Take 15 mg by mouth daily.     Multiple Vitamin (MULTIVITAMIN) tablet Take 1 tablet by mouth daily.     omeprazole (PRILOSEC) 40 MG capsule TAKE 1 CAPSULE BY MOUTH TWO TIMES DAILY BEFORE A MEAL 60 capsule 0   polyethylene glycol powder (GLYCOLAX/MIRALAX) 17 GM/SCOOP powder Take 1 capful (17 g) by mouth daily. 238 g 0   QUEtiapine  (SEROQUEL) 25 MG tablet Take 3 tablets (75 mg total) by mouth at bedtime. (Patient taking differently: Take 25-50 mg by mouth at bedtime as needed (sleep).) 90 tablet 5   senna (SENOKOT) 8.6 MG TABS tablet Take 1 tablet (8.6 mg total) by mouth in the morning and at bedtime. 120 tablet 0   sildenafil (REVATIO) 20 MG tablet Take 3-5 tablets as needed 1 hour prior to intercourse 60 tablet 6   Sodium Fluoride (PREVIDENT 5000 BOOSTER PLUS DT) Place 1 application  onto teeth 2 (two) times daily.     tadalafil (CIALIS) 20 MG tablet Take 1 tablet (20 mg total) by mouth daily as needed for erectile dysfunction. 10 tablet 11   traZODone (DESYREL) 50 MG tablet Take 50 mg by mouth at bedtime as needed for sleep.     Zinc Sulfate 220 (50 Zn) MG TABS Take 1 tablet (220 mg total) by mouth daily. 30 tablet 1   zolpidem (AMBIEN) 10 MG tablet Take 1 tablet (10 mg total) by mouth at bedtime as needed for sleep. 30 tablet 0   No current facility-administered medications for this visit.     PHYSICAL EXAMINATION: ECOG PERFORMANCE STATUS: 1 - Symptomatic but completely ambulatory Vitals:   04/21/23 1210  BP: (!) 119/93  Pulse: (!) 108  Resp: 18  Temp: (!) 97 F (36.1 C)  SpO2: 100%   Filed Weights   04/21/23 1210  Weight: 175 lb (79.4 kg)    Physical Exam HENT:     Head: Normocephalic and atraumatic.  Eyes:     General: No scleral icterus. Cardiovascular:     Rate and Rhythm: Normal rate.  Pulmonary:     Effort: Pulmonary effort is normal. No respiratory distress.     Breath  sounds: No wheezing.  Abdominal:     General: Bowel sounds are normal. There is no distension.     Palpations: Abdomen is soft.  Musculoskeletal:        General: No deformity. Normal range of motion.     Cervical back: Normal range of motion and neck supple.  Skin:    General: Skin is warm and dry.     Findings: No erythema or rash.  Neurological:     Mental Status: He is alert and oriented to person, place, and time.  Mental status is at baseline.     Cranial Nerves: No cranial nerve deficit.  Psychiatric:        Mood and Affect: Mood normal.     LABORATORY DATA:  I have reviewed the data as listed    Latest Ref Rng & Units 04/14/2023    9:09 AM 03/08/2023    4:08 AM 03/08/2023    1:50 AM  CBC  WBC 4.0 - 10.5 K/uL 6.9  11.0  12.2   Hemoglobin 13.0 - 17.0 g/dL 16.1  09.6  04.5   Hematocrit 39.0 - 52.0 % 39.9  31.5  32.9   Platelets 150 - 400 K/uL 262  218  237       Latest Ref Rng & Units 03/07/2023    2:34 AM 03/06/2023    6:18 AM 02/06/2023   10:31 AM  CMP  Glucose 70 - 99 mg/dL 409  96  811   BUN 6 - 20 mg/dL 10  10  15    Creatinine 0.61 - 1.24 mg/dL 9.14  7.82  9.56   Sodium 135 - 145 mmol/L 133  135  134   Potassium 3.5 - 5.1 mmol/L 3.8  3.5  4.2   Chloride 98 - 111 mmol/L 102  104  104   CO2 22 - 32 mmol/L 25  21  25    Calcium 8.9 - 10.3 mg/dL 7.9  8.6  9.1      Iron/TIBC/Ferritin/ %Sat    Component Value Date/Time   IRON 73 04/14/2023 0909   TIBC 399 04/14/2023 0909   FERRITIN 112 04/14/2023 0909   IRONPCTSAT 18 04/14/2023 0909      RADIOGRAPHIC STUDIES: I have personally reviewed the radiological images as listed and agreed with the findings in the report. No results found.

## 2023-04-26 DIAGNOSIS — Z419 Encounter for procedure for purposes other than remedying health state, unspecified: Secondary | ICD-10-CM | POA: Diagnosis not present

## 2023-05-05 ENCOUNTER — Other Ambulatory Visit: Payer: Self-pay | Admitting: Internal Medicine

## 2023-05-05 ENCOUNTER — Other Ambulatory Visit: Payer: Self-pay | Admitting: Gastroenterology

## 2023-05-06 ENCOUNTER — Other Ambulatory Visit: Payer: Self-pay | Admitting: Internal Medicine

## 2023-05-08 ENCOUNTER — Telehealth: Payer: Self-pay

## 2023-05-08 ENCOUNTER — Encounter: Payer: Self-pay | Admitting: Oncology

## 2023-05-08 ENCOUNTER — Other Ambulatory Visit: Payer: Self-pay

## 2023-05-08 DIAGNOSIS — K2210 Ulcer of esophagus without bleeding: Secondary | ICD-10-CM

## 2023-05-08 MED ORDER — OMEPRAZOLE 40 MG PO CPDR: 40.0000 mg | DELAYED_RELEASE_CAPSULE | Freq: Every day | ORAL | 0 refills | Status: DC

## 2023-05-08 NOTE — Telephone Encounter (Signed)
Omeprazole 40 mg once daily before breakfast, 30-day supply only  RV

## 2023-05-08 NOTE — Telephone Encounter (Addendum)
Refill request Cymbalta Last office visit 10/14/22 Last refill 04/12/22 with one year of refills Upcoming appointment 07/12/23

## 2023-05-08 NOTE — Telephone Encounter (Signed)
Called and left a message for call back  

## 2023-05-08 NOTE — Telephone Encounter (Signed)
Last office visit 10/12/2022 Plan:  Erosive esophagitis Continue omeprazole 40 mg twice daily before meals Recommend repeat EGD to confirm healing if patient is agreeable   No EGD was preformed  Last EGD was 06/16/2022 Last refill 04/12/2023 0 refills

## 2023-05-08 NOTE — Telephone Encounter (Signed)
-----   Message from Toney Reil, MD sent at 05/08/2023  8:49 AM EDT ----- Regarding: Re: EGD I did not refill his omeprazole.  He is past due for EGD for follow-up of severe erosive esophagitis.  Please reach out to patient and discuss about follow-up upper endoscopy  Rohini Vanga

## 2023-05-08 NOTE — Telephone Encounter (Signed)
Patient called back and schedule the EGD for 05/24/2023. Went over instructions with patient, mailed them and sent to [facility]. Patient asked if we could refill the omeprazole since the EGD is schedule

## 2023-05-08 NOTE — Addendum Note (Signed)
Addended by: Radene Knee L on: 05/08/2023 03:06 PM   Modules accepted: Orders

## 2023-05-08 NOTE — Telephone Encounter (Signed)
LAST APPOINTMENT DATE: 05/05/2023   NEXT APPOINTMENT DATE: 07/12/2023    LAST REFILL: 09/19/22  QTY: #90 w// 5 refills

## 2023-05-08 NOTE — Telephone Encounter (Signed)
Sent medication to the pharmacy  

## 2023-05-17 ENCOUNTER — Encounter: Payer: Self-pay | Admitting: Gastroenterology

## 2023-05-18 DIAGNOSIS — M545 Low back pain, unspecified: Secondary | ICD-10-CM | POA: Diagnosis not present

## 2023-05-18 DIAGNOSIS — Z833 Family history of diabetes mellitus: Secondary | ICD-10-CM | POA: Diagnosis not present

## 2023-05-18 DIAGNOSIS — Z7983 Long term (current) use of bisphosphonates: Secondary | ICD-10-CM | POA: Diagnosis not present

## 2023-05-18 DIAGNOSIS — M1990 Unspecified osteoarthritis, unspecified site: Secondary | ICD-10-CM | POA: Diagnosis not present

## 2023-05-18 DIAGNOSIS — Z9181 History of falling: Secondary | ICD-10-CM | POA: Diagnosis not present

## 2023-05-18 DIAGNOSIS — Z79899 Other long term (current) drug therapy: Secondary | ICD-10-CM | POA: Diagnosis not present

## 2023-05-18 DIAGNOSIS — G4700 Insomnia, unspecified: Secondary | ICD-10-CM | POA: Diagnosis not present

## 2023-05-18 DIAGNOSIS — I10 Essential (primary) hypertension: Secondary | ICD-10-CM | POA: Diagnosis not present

## 2023-05-18 DIAGNOSIS — K219 Gastro-esophageal reflux disease without esophagitis: Secondary | ICD-10-CM | POA: Diagnosis not present

## 2023-05-18 DIAGNOSIS — Z809 Family history of malignant neoplasm, unspecified: Secondary | ICD-10-CM | POA: Diagnosis not present

## 2023-05-18 DIAGNOSIS — J309 Allergic rhinitis, unspecified: Secondary | ICD-10-CM | POA: Diagnosis not present

## 2023-05-18 DIAGNOSIS — Z87891 Personal history of nicotine dependence: Secondary | ICD-10-CM | POA: Diagnosis not present

## 2023-05-23 ENCOUNTER — Encounter: Payer: Self-pay | Admitting: Gastroenterology

## 2023-05-24 ENCOUNTER — Ambulatory Visit: Payer: 59 | Admitting: Anesthesiology

## 2023-05-24 ENCOUNTER — Telehealth: Payer: Self-pay

## 2023-05-24 ENCOUNTER — Encounter: Payer: Self-pay | Admitting: Gastroenterology

## 2023-05-24 ENCOUNTER — Ambulatory Visit
Admission: RE | Admit: 2023-05-24 | Discharge: 2023-05-24 | Disposition: A | Discharge status: Home / Self Care | Payer: 59 | Source: Ambulatory Visit | Attending: Gastroenterology | Admitting: Gastroenterology

## 2023-05-24 ENCOUNTER — Encounter
Admission: RE | Disposition: A | Discharge status: Home / Self Care | Payer: Self-pay | Source: Ambulatory Visit | Attending: Gastroenterology

## 2023-05-24 DIAGNOSIS — M25561 Pain in right knee: Secondary | ICD-10-CM | POA: Diagnosis not present

## 2023-05-24 DIAGNOSIS — K2100 Gastro-esophageal reflux disease with esophagitis, without bleeding: Secondary | ICD-10-CM | POA: Insufficient documentation

## 2023-05-24 DIAGNOSIS — I10 Essential (primary) hypertension: Secondary | ICD-10-CM | POA: Diagnosis not present

## 2023-05-24 DIAGNOSIS — K2210 Ulcer of esophagus without bleeding: Secondary | ICD-10-CM | POA: Diagnosis not present

## 2023-05-24 DIAGNOSIS — S72461D Displaced supracondylar fracture with intracondylar extension of lower end of right femur, subsequent encounter for closed fracture with routine healing: Secondary | ICD-10-CM | POA: Diagnosis not present

## 2023-05-24 DIAGNOSIS — Z800 Family history of malignant neoplasm of digestive organs: Secondary | ICD-10-CM | POA: Insufficient documentation

## 2023-05-24 DIAGNOSIS — K208 Other esophagitis without bleeding: Secondary | ICD-10-CM | POA: Diagnosis not present

## 2023-05-24 DIAGNOSIS — F17290 Nicotine dependence, other tobacco product, uncomplicated: Secondary | ICD-10-CM | POA: Diagnosis not present

## 2023-05-24 DIAGNOSIS — Z980 Intestinal bypass and anastomosis status: Secondary | ICD-10-CM | POA: Diagnosis not present

## 2023-05-24 DIAGNOSIS — Z9884 Bariatric surgery status: Secondary | ICD-10-CM | POA: Diagnosis not present

## 2023-05-24 DIAGNOSIS — T8484XA Pain due to internal orthopedic prosthetic devices, implants and grafts, initial encounter: Secondary | ICD-10-CM | POA: Diagnosis not present

## 2023-05-24 DIAGNOSIS — M11261 Other chondrocalcinosis, right knee: Secondary | ICD-10-CM | POA: Diagnosis not present

## 2023-05-24 DIAGNOSIS — K259 Gastric ulcer, unspecified as acute or chronic, without hemorrhage or perforation: Secondary | ICD-10-CM | POA: Diagnosis not present

## 2023-05-24 DIAGNOSIS — Z09 Encounter for follow-up examination after completed treatment for conditions other than malignant neoplasm: Secondary | ICD-10-CM | POA: Insufficient documentation

## 2023-05-24 DIAGNOSIS — F17200 Nicotine dependence, unspecified, uncomplicated: Secondary | ICD-10-CM | POA: Diagnosis not present

## 2023-05-24 HISTORY — PX: ESOPHAGOGASTRODUODENOSCOPY (EGD) WITH PROPOFOL: SHX5813

## 2023-05-24 SURGERY — ESOPHAGOGASTRODUODENOSCOPY (EGD) WITH PROPOFOL
Anesthesia: General

## 2023-05-24 MED ORDER — PROPOFOL 10 MG/ML IV BOLUS
INTRAVENOUS | Status: DC | PRN
  Administered 2023-05-24: 50 mg via INTRAVENOUS

## 2023-05-24 MED ORDER — LIDOCAINE HCL (CARDIAC) PF 100 MG/5ML IV SOSY
PREFILLED_SYRINGE | INTRAVENOUS | Status: DC | PRN
  Administered 2023-05-24: 60 mg via INTRAVENOUS

## 2023-05-24 MED ORDER — DEXMEDETOMIDINE HCL IN NACL 200 MCG/50ML IV SOLN
INTRAVENOUS | Status: DC | PRN
  Administered 2023-05-24: 20 ug via INTRAVENOUS

## 2023-05-24 MED ORDER — DEXMEDETOMIDINE HCL IN NACL 80 MCG/20ML IV SOLN
INTRAVENOUS | Status: AC
  Filled 2023-05-24: qty 20

## 2023-05-24 MED ORDER — LIDOCAINE HCL (PF) 2 % IJ SOLN
INTRAMUSCULAR | Status: AC
  Filled 2023-05-24: qty 5

## 2023-05-24 MED ORDER — SODIUM CHLORIDE 0.9 % IV SOLN: INTRAVENOUS | Status: DC

## 2023-05-24 MED ORDER — VONOPRAZAN FUMARATE 10 MG PO TABS: 1.0000 | ORAL_TABLET | Freq: Every day | ORAL | 5 refills | Status: DC

## 2023-05-24 MED ORDER — PROPOFOL 500 MG/50ML IV EMUL
INTRAVENOUS | Status: DC | PRN
  Administered 2023-05-24: 150 ug/kg/min via INTRAVENOUS

## 2023-05-24 MED ORDER — PROPOFOL 1000 MG/100ML IV EMUL
INTRAVENOUS | Status: AC
  Filled 2023-05-24: qty 100

## 2023-05-24 MED ORDER — VONOPRAZAN FUMARATE 20 MG PO TABS: 1.0000 | ORAL_TABLET | Freq: Every day | ORAL | 2 refills | Status: DC

## 2023-05-24 NOTE — Op Note (Signed)
[facility] Gastroenterology Patient Name: Harold Smith Procedure Date: 05/24/2023 8:16 AM MRN: [id number] Account #: [id number] Date of Birth: 09-16-1966 Admit Type: Outpatient Age: 57 Room: [facility] ENDO ROOM 4 Gender: Male Note Status: Finalized Instrument Name: Upper Endoscope [id number] Procedure:             Upper GI endoscopy Indications:           Follow-up of reflux esophagitis Providers:             Toney Reil MD, MD Referring MD:          Karie Schwalbe (Referring MD) Medicines:             General Anesthesia Complications:         No immediate complications. Estimated blood loss: None. Procedure:             Pre-Anesthesia Assessment:                        - Prior to the procedure, a History and Physical was                         performed, and patient medications and allergies were                         reviewed. The patient is competent. The risks and                         benefits of the procedure and the sedation options and                         risks were discussed with the patient. All questions                         were answered and informed consent was obtained.                         Patient identification and proposed procedure were                         verified by the physician, the nurse, the                         anesthesiologist, the anesthetist and the technician                         in the pre-procedure area in the procedure room in the                         endoscopy suite. Mental Status Examination: alert and                         oriented. Airway Examination: normal oropharyngeal                         airway and neck mobility. Respiratory Examination:                         clear to auscultation. CV Examination: normal.  Prophylactic Antibiotics: The patient does not require                         prophylactic antibiotics. Prior Anticoagulants: The                          patient has taken no anticoagulant or antiplatelet                         agents. ASA Grade Assessment: III - A patient with                         severe systemic disease. After reviewing the risks and                         benefits, the patient was deemed in satisfactory                         condition to undergo the procedure. The anesthesia                         plan was to use general anesthesia. Immediately prior                         to administration of medications, the patient was                         re-assessed for adequacy to receive sedatives. The                         heart rate, respiratory rate, oxygen saturations,                         blood pressure, adequacy of pulmonary ventilation, and                         response to care were monitored throughout the                         procedure. The physical status of the patient was                         re-assessed after the procedure.                        After obtaining informed consent, the endoscope was                         passed under direct vision. Throughout the procedure,                         the patient's blood pressure, pulse, and oxygen                         saturations were monitored continuously. The Endoscope                         was introduced through the mouth, and advanced to the  jejunum. The upper GI endoscopy was accomplished                         without difficulty. The patient tolerated the                         procedure well. Findings:      Evidence of a Roux-en-Y gastrojejunostomy was found. The gastrojejunal       anastomosis was characterized by healthy appearing mucosa. This was       traversed. The pouch-to-jejunum limb was characterized by healthy       appearing mucosa.      LA Grade B (one or more mucosal breaks greater than 5 mm, not extending       between the tops of two mucosal folds) esophagitis with no bleeding was        found in the distal esophagus and gastroesophageal junction. Impression:            - Roux-en-Y gastrojejunostomy with gastrojejunal                         anastomosis characterized by healthy appearing mucosa.                        - LA Grade B reflux esophagitis with no bleeding.                        - No specimens collected. Recommendation:        - Discharge patient to home (with escort).                        - Resume previous diet today.                        - Continue present medications.                        - Follow an antireflux regimen indefinitely.                        - Switch to vonoprazam 20mg  daily Procedure Code(s):     --- Professional ---                        [id number], Esophagogastroduodenoscopy, flexible,                         transoral; diagnostic, including collection of                         specimen(s) by brushing or washing, when performed                         (separate procedure) Diagnosis Code(s):     --- Professional ---                        Z98.0, Intestinal bypass and anastomosis status                        K21.00, Gastro-esophageal reflux disease with  esophagitis, without bleeding CPT copyright 2022 American Medical Association. All rights reserved. The codes documented in this report are preliminary and upon coder review may  be revised to meet current compliance requirements. Dr. Libby Maw Toney Reil MD, MD 05/24/2023 8:44:30 AM This report has been signed electronically. Number of Addenda: 0 Note Initiated On: 05/24/2023 8:16 AM Estimated Blood Loss:  Estimated blood loss: none. Estimated blood loss: none.      [facility]

## 2023-05-24 NOTE — Transfer of Care (Signed)
Immediate Anesthesia Transfer of Care Note  Patient: Harold Smith  Procedure(s) Performed: ESOPHAGOGASTRODUODENOSCOPY (EGD) WITH PROPOFOL  Patient Location: PACU  Anesthesia Type:General  Level of Consciousness: sedated  Airway & Oxygen Therapy: Patient Spontanous Breathing  Post-op Assessment: Report given to RN and Post -op Vital signs reviewed and stable  Post vital signs: Reviewed and stable  Last Vitals:  Vitals Value Taken Time  BP 152/103 05/24/23 0846  Temp 36.3 C 05/24/23 0844  Pulse 82 05/24/23 0845  Resp 16 05/24/23 0847  SpO2 100 % 05/24/23 0845  Vitals shown include unvalidated device data.  Last Pain:  Vitals:   05/24/23 0844  TempSrc: Temporal  PainSc: 0-No pain         Complications: No notable events documented.

## 2023-05-24 NOTE — H&P (Signed)
Harold Repress, MD [address]  [address]  [place], [place] [address]  Main: [phone]  Fax: [phone] Pager: [phone]  Primary Care Physician:  Karie Schwalbe, MD Primary Gastroenterologist:  Dr. Arlyss Smith  Pre-Procedure History & Physical: HPI:  Harold Smith is a 57 y.o. male is here for an endoscopy.   Past Medical History:  Diagnosis Date   Allergic rhinitis due to pollen    Anxiety    Chronic pain    Colon polyps    Depression    Diverticulitis large intestine    GERD (gastroesophageal reflux disease)    HTN (hypertension)    Irritable bowel syndrome    Metabolic bone disease 09/01/2022   Obesity    Osteoarthritis, knee    Sleep apnea    does not use cpap, pt states he no longer needed CPAP due to weight loss   Sleep disturbance    Vitamin D deficiency 09/01/2022    Past Surgical History:  Procedure Laterality Date   ABDOMINAL EXPOSURE N/A 03/06/2023   Procedure: ABDOMINAL EXPOSURE;  Surgeon: Cephus Shelling, MD;  Location: [facility] OR;  Service: Vascular;  Laterality: N/A;   ACHILLES TENDON REPAIR Right 12/26/2006   ANTERIOR LUMBAR FUSION N/A 03/06/2023   Procedure: L5-S1 ANTERIOR LUMBAR FUSION 1 LEVEL;  Surgeon: London Sheer, MD;  [facility]: [facility] OR;  Service: Orthopedics;  Laterality: N/A;   CATARACT EXTRACTION W/PHACO Left 07/20/2021   Procedure: CATARACT EXTRACTION PHACO AND INTRAOCULAR LENS PLACEMENT (IOC) LEFT 2.11 00:24.0;  Surgeon: Galen Manila, MD;  Location: [facility];  Service: Ophthalmology;  Laterality: Left;  sleep apnea   CATARACT EXTRACTION W/PHACO Right 08/03/2021   Procedure: CATARACT EXTRACTION PHACO AND INTRAOCULAR LENS PLACEMENT (IOC) RIGHT;  Surgeon: Galen Manila, MD;  Location: [facility];  Service: Ophthalmology;  Laterality: Right;  3.19 0:33.0   CHOLECYSTECTOMY     COLON RESECTION Left 12/26/2004   due to diverticular disease at [facility]   COLONOSCOPY  2013?   COLONOSCOPY N/A  11/30/2022   Procedure: COLONOSCOPY;  Surgeon: Toney Reil, MD;  Location: [facility] ENDOSCOPY;  Service: Gastroenterology;  Laterality: N/A;   COLONOSCOPY WITH PROPOFOL N/A 05/22/2018   Procedure: COLONOSCOPY WITH PROPOFOL;  Surgeon: Toney Reil, MD;  Location: [facility] ENDOSCOPY;  Service: Gastroenterology;  Laterality: N/A;   COLONOSCOPY WITH PROPOFOL N/A 10/22/2020   Procedure: COLONOSCOPY WITH PROPOFOL;  Surgeon: Toney Reil, MD;  Location: [facility];  Service: Endoscopy;  Laterality: N/A;  priority 4   COLONOSCOPY WITH PROPOFOL N/A 11/10/2021   Procedure: COLONOSCOPY WITH PROPOFOL;  Surgeon: Toney Reil, MD;  Location: [facility] ENDOSCOPY;  Service: Gastroenterology;  Laterality: N/A;   COLONOSCOPY WITH PROPOFOL N/A 06/16/2022   Procedure: COLONOSCOPY WITH PROPOFOL;  Surgeon: Toney Reil, MD;  Location: [facility];  Service: Endoscopy;  Laterality: N/A;   COLONOSCOPY WITH PROPOFOL N/A 11/29/2022   Procedure: COLONOSCOPY WITH PROPOFOL;  Surgeon: Toney Reil, MD;  Location: [facility] ENDOSCOPY;  Service: Gastroenterology;  Laterality: N/A;   DRUG INDUCED ENDOSCOPY N/A 06/10/2019   Procedure: DRUG INDUCED SLEEP ENDOSCOPY;  Surgeon: Osborn Coho, MD;  Location: [facility];  Service: ENT;  Laterality: N/A;   ESOPHAGOGASTRODUODENOSCOPY (EGD) WITH PROPOFOL N/A 06/16/2022   Procedure: ESOPHAGOGASTRODUODENOSCOPY (EGD) WITH PROPOFOL;  Surgeon: Toney Reil, MD;  Location: [facility];  Service: Endoscopy;  Laterality: N/A;   GASTRIC BYPASS N/A 12/27/2007   HIATAL HERNIA REPAIR  09/25/2014   at [facility]  ORIF FEMUR FRACTURE Right 08/30/2022   Procedure: OPEN REDUCTION INTERNAL FIXATION (ORIF) DISTAL FEMUR FRACTURE;  Surgeon: Myrene Galas, MD;  [facility]: [facility] OR;  Service: Orthopedics;  Laterality: Right;   POLYPECTOMY  10/22/2020   Procedure: POLYPECTOMY;  Surgeon: Toney Reil, MD;  Location: [facility];   Service: Endoscopy;;   POLYPECTOMY  06/16/2022   Procedure: POLYPECTOMY;  Surgeon: Toney Reil, MD;  Location: [facility];  Service: Endoscopy;;   ROTATOR CUFF REPAIR Right    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 Authorizing Provider  alendronate (FOSAMAX) 70 MG tablet Take 1 tablet (70 mg total) by mouth once a week. Take with a full glass of water on an empty stomach. 12/30/22  Yes London Sheer, MD  CALCIUM PO Take 1 tablet by mouth daily.   Yes [provider]  cetirizine (ZYRTEC) 10 MG tablet Take 10 mg by mouth daily.   Yes [provider]  Cholecalciferol 125 MCG (5000 UT) TABS Take 1 tablet by mouth daily. Patient taking differently: Take 5,000 Units by mouth daily. 09/01/22  Yes Montez Morita, PA-C  Cyanocobalamin (VITAMIN B-12 PO) Take 1 tablet by mouth daily.   Yes [provider]  DULoxetine (CYMBALTA) 60 MG capsule TAKE 1 CAPSULE BY MOUTH ONCE DAILY 05/08/23  Yes Tillman Abide I, MD  gabapentin (NEURONTIN) 300 MG capsule TAKE 1 CAPSULE BY MOUTH TWICE DAILY AND TAKE 4 CAPSULES AT BEDTIME 04/13/23  Yes Karie Schwalbe, MD  hydrochlorothiazide (HYDRODIURIL) 25 MG tablet Take 1 tablet (25 mg total) by mouth daily. 09/29/22  Yes Tillman Abide I, MD  lisinopril (ZESTRIL) 20 MG tablet Take 1 tablet (20 mg total) by mouth daily. 09/29/22  Yes Karie Schwalbe, MD  meloxicam (MOBIC) 15 MG tablet Take 15 mg by mouth daily. 04/12/23  Yes [provider]  Multiple Vitamin (MULTIVITAMIN) tablet Take 1 tablet by mouth daily.   Yes [provider]  clindamycin (CLEOCIN) 300 MG capsule Take by mouth. 04/04/23   [provider]  dicyclomine (BENTYL) 10 MG capsule Take 1 to 2 capsules as needed for abdominal cramps and diarrhea every 6 to 8 hours Patient taking differently: Take 10 mg by mouth as needed for spasms. 10/12/22   Toney Reil, MD  FEROSUL 325 (65 Fe) MG tablet TAKE 1 TABLET BY MOUTH DAILY WITH BREAKFAST Patient taking differently: Take 325 mg by mouth daily. 06/29/22   Karie Schwalbe, MD  fluticasone (FLONASE) 50 MCG/ACT nasal spray Place 2 sprays into both nostrils daily. Patient taking differently: Place 2 sprays into both nostrils daily as needed for allergies or rhinitis. 07/11/22   Karie Schwalbe, MD  hydrocortisone (ANUSOL-HC) 25 MG suppository Place 1 suppository (25 mg total) rectally daily. 10/12/22   Toney Reil, MD  lipase/protease/amylase (CREON) 36000 UNITS CPEP capsule Take 2 capsules (72,000 Units total) by mouth 3 (three) times daily with meals. May also take 1 capsule (36,000 Units total) as needed (with snacks). Patient taking differently: Take 36,000 units by mouth 3 (three) times daily with meals.  07/15/22   Toney Reil, MD  omeprazole (PRILOSEC) 40 MG capsule Take 1 capsule (40 mg total) by mouth daily before breakfast. 05/08/23   Judith Campillo, Loel Dubonnet, MD  polyethylene glycol powder (GLYCOLAX/MIRALAX) 17 GM/SCOOP powder Take 1 capful (17 g) by mouth daily. 03/09/23   London Sheer, MD  QUEtiapine (SEROQUEL) 25 MG tablet Take 1-2 tablets (25-50 mg total) by mouth at bedtime as needed (sleep). 05/08/23   Karie Schwalbe, MD  senna (SENOKOT) 8.6 MG TABS tablet Take 1 tablet (8.6 mg total) by mouth in the morning and at bedtime. 03/09/23   London Sheer, MD  sildenafil (REVATIO) 20 MG tablet Take 3-5 tablets as needed 1 hour prior to intercourse 08/16/22   Vanna Scotland, MD  Sodium Fluoride (PREVIDENT 5000 BOOSTER PLUS DT) Place 1 application  onto teeth 2 (two) times daily.    [provider]  tadalafil (CIALIS) 20 MG tablet Take 1 tablet (20 mg total) by mouth daily as needed for erectile dysfunction. 04/19/23   Vanna Scotland, MD  traZODone (DESYREL) 50 MG tablet Take 50 mg by mouth at bedtime as needed for sleep.    [provider]  Zinc Sulfate 220 (50  Zn) MG TABS Take 1 tablet (220 mg total) by mouth daily. 09/02/22   Montez Morita, PA-C  zolpidem (AMBIEN) 10 MG tablet Take 1 tablet (10 mg total) by mouth at bedtime as needed for sleep. 03/31/23 04/30/23  Karie Schwalbe, MD    Allergies as of 05/08/2023   (No Known Allergies)    Family History  Problem Relation Age of Onset   Hypertension Mother    Diabetes Mother    Kidney disease Mother    Heart disease Mother    Colon cancer Father 20   Asthma Sister    Obesity Sister    Stroke Sister    Obesity Brother    Diabetes Other    Heart disease Other    Hypertension Other    Kidney disease Other    Cancer Paternal Uncle        unk type, possible prostate   Lung cancer Paternal Grandmother     Social History   Socioeconomic History   Marital status: Single    Spouse name: Not on file   Number of children: 3   Years of education: Not on file   Highest education level: Not on file  Occupational History   Occupation: Hydrographic surveyor  Tobacco Use   Smoking status: Some Days    Types: Cigars    Passive exposure: Past   Smokeless tobacco: Never   Tobacco comments:    occasional cigar  Vaping Use   Vaping Use: Never used  Substance and Sexual Activity   Alcohol use: Not Currently   Drug use: No   Sexual activity: Yes  Other Topics Concern   Not on file  Social History Narrative   Not on file   Social Determinants of Health   Financial Resource Strain: Not on file  Food Insecurity: No Food Insecurity (03/06/2023)   Hunger Vital Sign    Worried About Running Out of Food in the Last Year: Never true    Ran Out of Food in the Last Year: Never true  Transportation Needs: No Transportation Needs (03/06/2023)   PRAPARE - Administrator, Civil Service (Medical): No    Lack of Transportation (Non-Medical): No  Physical Activity: Not on file  Stress: Not on file  Social Connections: Not on file  Intimate Partner Violence: Not At Risk (03/06/2023)    Humiliation, Afraid, Rape, and Kick questionnaire    Fear of Current or Ex-Partner: No    Emotionally Abused: No    Physically Abused: No    Sexually Abused: No    Review of Systems: See  HPI, otherwise negative ROS  Physical Exam: BP (!) 154/98   Pulse 70   Temp 97.7 F (36.5 C) (Temporal)   Resp 17   Ht 6' (1.829 m)   Wt 81.2 kg   SpO2 100%   BMI 24.28 kg/m  General:   Alert,  pleasant and cooperative in NAD Head:  Normocephalic and atraumatic. Neck:  Supple; no masses or thyromegaly. Lungs:  Clear throughout to auscultation.    Heart:  Regular rate and rhythm. Abdomen:  Soft, nontender and nondistended. Normal bowel sounds, without guarding, and without rebound.   Neurologic:  Alert and  oriented x4;  grossly normal neurologically.  Impression/Plan: Harold Smith is here for an endoscopy to be performed for follow-up of severe erosive esophagitis   Risks, benefits, limitations, and alternatives regarding  endoscopy have been reviewed with the patient.  Questions have been answered.  All parties agreeable.   Lannette Donath, MD  05/24/2023, 7:55 AM

## 2023-05-24 NOTE — Telephone Encounter (Signed)
Patient verbalized understanding of the change in medication. Sent medication to [facility] and for the 20mg  and then for the 10mg . Informed patient it would be coming form the speciality pharmacy

## 2023-05-24 NOTE — Anesthesia Preprocedure Evaluation (Signed)
Anesthesia Evaluation  Patient identified by MRN, date of birth, ID band Patient awake    Reviewed: Allergy & Precautions, H&P , NPO status , Patient's Chart, lab work & pertinent test results, reviewed documented beta blocker date and time   Airway Mallampati: II   Neck ROM: full    Dental  (+) Poor Dentition   Pulmonary sleep apnea , Current Smoker and Patient abstained from smoking.   Pulmonary exam normal        Cardiovascular Exercise Tolerance: Good hypertension, On Medications negative cardio ROS Normal cardiovascular exam Rhythm:regular Rate:Normal     Neuro/Psych  PSYCHIATRIC DISORDERS Anxiety Depression     Neuromuscular disease    GI/Hepatic Neg liver ROS, PUD,GERD  ,,  Endo/Other  negative endocrine ROS    Renal/GU negative Renal ROS  negative genitourinary   Musculoskeletal   Abdominal   Peds  Hematology negative hematology ROS (+)   Anesthesia Other Findings Past Medical History: No date: Allergic rhinitis due to pollen No date: Anxiety No date: Chronic pain No date: Colon polyps No date: Depression No date: Diverticulitis large intestine No date: GERD (gastroesophageal reflux disease) No date: HTN (hypertension) No date: Irritable bowel syndrome 09/01/2022: Metabolic bone disease No date: Obesity No date: Osteoarthritis, knee No date: Sleep apnea     Comment:  does not use cpap, pt states he no longer needed CPAP               due to weight loss No date: Sleep disturbance 09/01/2022: Vitamin D deficiency Past Surgical History: 03/06/2023: ABDOMINAL EXPOSURE; N/A     Comment:  Procedure: ABDOMINAL EXPOSURE;  Surgeon: Cephus Shelling, MD;  Location: [facility] OR;  Service: Vascular;               Laterality: N/A; 12/26/2006: ACHILLES TENDON REPAIR; Right 03/06/2023: ANTERIOR LUMBAR FUSION; N/A     Comment:  Procedure: L5-S1 ANTERIOR LUMBAR FUSION 1 LEVEL;                 Surgeon: London Sheer, MD;  Location: [facility] OR;                Service: Orthopedics;  Laterality: N/A; 07/20/2021: CATARACT EXTRACTION W/PHACO; Left     Comment:  Procedure: CATARACT EXTRACTION PHACO AND INTRAOCULAR               LENS PLACEMENT (IOC) LEFT 2.11 00:24.0;  Surgeon:               Galen Manila, MD;  Location: [facility];                Service: Ophthalmology;  Laterality: Left;  sleep apnea 08/03/2021: CATARACT EXTRACTION W/PHACO; Right     Comment:  Procedure: CATARACT EXTRACTION PHACO AND INTRAOCULAR               LENS PLACEMENT (IOC) RIGHT;  Surgeon: Galen Manila,               MD;  Location: [facility];  Service:               Ophthalmology;  Laterality: Right;  3.19 0:33.0 No date: CHOLECYSTECTOMY 12/26/2004: COLON RESECTION; Left     Comment:  due to diverticular disease at [facility] 2013?: COLONOSCOPY 11/30/2022: COLONOSCOPY; N/A     Comment:  Procedure: COLONOSCOPY;  Surgeon: Toney Reil,  MD;  Location: [facility] ENDOSCOPY;  Service:               Gastroenterology;  Laterality: N/A; 05/22/2018: COLONOSCOPY WITH PROPOFOL; N/A     Comment:  Procedure: COLONOSCOPY WITH PROPOFOL;  Surgeon: Toney Reil, MD;  Location: [facility] ENDOSCOPY;  Service:               Gastroenterology;  Laterality: N/A; 10/22/2020: COLONOSCOPY WITH PROPOFOL; N/A     Comment:  Procedure: COLONOSCOPY WITH PROPOFOL;  Surgeon: Toney Reil, MD;  Location: [facility];                Service: Endoscopy;  Laterality: N/A;  priority 4 11/10/2021: COLONOSCOPY WITH PROPOFOL; N/A     Comment:  Procedure: COLONOSCOPY WITH PROPOFOL;  Surgeon: Toney Reil, MD;  Location: [facility] ENDOSCOPY;  Service:               Gastroenterology;  Laterality: N/A; 06/16/2022: COLONOSCOPY WITH PROPOFOL; N/A     Comment:  Procedure: COLONOSCOPY WITH PROPOFOL;  Surgeon: Toney Reil, MD;   Location: [facility];                Service: Endoscopy;  Laterality: N/A; 11/29/2022: COLONOSCOPY WITH PROPOFOL; N/A     Comment:  Procedure: COLONOSCOPY WITH PROPOFOL;  Surgeon: Toney Reil, MD;  Location: [facility] ENDOSCOPY;  Service:               Gastroenterology;  Laterality: N/A; 06/10/2019: DRUG INDUCED ENDOSCOPY; N/A     Comment:  Procedure: DRUG INDUCED SLEEP ENDOSCOPY;  Surgeon:               Osborn Coho, MD;  Location: [facility];  Service: ENT;  Laterality: N/A; 06/16/2022: ESOPHAGOGASTRODUODENOSCOPY (EGD) WITH PROPOFOL; N/A     Comment:  Procedure: ESOPHAGOGASTRODUODENOSCOPY (EGD) WITH               PROPOFOL;  Surgeon: Toney Reil, MD;  Location:               [facility];  Service: Endoscopy;  Laterality:               N/A; 12/27/2007: GASTRIC BYPASS; N/A 09/25/2014: HIATAL HERNIA REPAIR     Comment:  at [facility] 08/30/2022: ORIF FEMUR FRACTURE; Right     Comment:  Procedure: OPEN REDUCTION INTERNAL FIXATION (ORIF)               DISTAL FEMUR FRACTURE;  Surgeon: Myrene Galas, MD;                Location: [facility];  Service: Orthopedics;  Laterality:               Right; 10/22/2020: POLYPECTOMY     Comment:  Procedure: POLYPECTOMY;  Surgeon: Toney Reil,               MD;  Location: [facility];  Service: Endoscopy;; 06/16/2022: POLYPECTOMY  Comment:  Procedure: POLYPECTOMY;  Surgeon: Toney Reil,               MD;  Location: [facility];  Service: Endoscopy;; No date: ROTATOR CUFF REPAIR; Right 09/25/2014: ROUX-EN-Y GASTRIC BYPASS     Comment:  revision BMI    Body Mass Index: 24.28 kg/m     Reproductive/Obstetrics negative OB ROS                             Anesthesia Physical Anesthesia Plan  ASA: 3  Anesthesia Plan: General   Post-op Pain Management:    Induction:   PONV Risk Score and Plan:   Airway Management Planned:    Additional Equipment:   Intra-op Plan:   Post-operative Plan:   Informed Consent: I have reviewed the patients History and Physical, chart, labs and discussed the procedure including the risks, benefits and alternatives for the proposed anesthesia with the patient or authorized representative who has indicated his/her understanding and acceptance.     Dental Advisory Given  Plan Discussed with: CRNA  Anesthesia Plan Comments:        Anesthesia Quick Evaluation

## 2023-05-24 NOTE — Anesthesia Postprocedure Evaluation (Signed)
Anesthesia Post Note  Patient: Harold Smith  Procedure(s) Performed: ESOPHAGOGASTRODUODENOSCOPY (EGD) WITH PROPOFOL  Patient location during evaluation: PACU Anesthesia Type: General Level of consciousness: awake and alert Pain management: pain level controlled Vital Signs Assessment: post-procedure vital signs reviewed and stable Respiratory status: spontaneous breathing, nonlabored ventilation, respiratory function stable and patient connected to nasal cannula oxygen Cardiovascular status: blood pressure returned to baseline and stable Postop Assessment: no apparent nausea or vomiting Anesthetic complications: no   No notable events documented.   Last Vitals:  Vitals:   05/24/23 0714 05/24/23 0844  BP: (!) 154/98 (!) 152/103  Pulse: 70 86  Resp: 17 10  Temp: 36.5 C (!) 36.3 C  SpO2: 100% 100%    Last Pain:  Vitals:   05/24/23 0844  TempSrc: Temporal  PainSc: 0-No pain                 Yevette Edwards

## 2023-05-24 NOTE — Telephone Encounter (Signed)
-----   Message from Toney Reil, MD sent at 05/24/2023  8:55 AM EDT ----- Regarding: Harold Smith  He has persistent erosive esophagitis.  Therefore, recommend switching from omeprazole to vonoprazan 20 mg once daily for 8 weeks. Maintenance of healing of erosive esophagitis: 10 mg once daily for up to 6 months He will continue omeprazole until he receives the medication   Please let him know tomorrow  Rohini Vanga

## 2023-05-25 ENCOUNTER — Encounter: Payer: Self-pay | Admitting: Gastroenterology

## 2023-05-27 DIAGNOSIS — Z419 Encounter for procedure for purposes other than remedying health state, unspecified: Secondary | ICD-10-CM | POA: Diagnosis not present

## 2023-05-29 ENCOUNTER — Ambulatory Visit (INDEPENDENT_AMBULATORY_CARE_PROVIDER_SITE_OTHER): Payer: 59 | Admitting: Orthopedic Surgery

## 2023-05-29 ENCOUNTER — Other Ambulatory Visit (INDEPENDENT_AMBULATORY_CARE_PROVIDER_SITE_OTHER): Payer: 59

## 2023-05-29 DIAGNOSIS — Z981 Arthrodesis status: Secondary | ICD-10-CM

## 2023-05-29 MED ORDER — METHOCARBAMOL 500 MG PO TABS: 500.0000 mg | ORAL_TABLET | Freq: Three times a day (TID) | ORAL | 2 refills | Status: DC | PRN

## 2023-05-29 NOTE — Progress Notes (Signed)
3 Months Post-Operative Scores   ODI: 32 VAS back: 8 VAS leg: 1 SF-36:             -Physical functioning: 10             -Role limitations due to physical health: 0             -Role limitations due to emotional problems: 0             -Energy/fatigue: 10             -Emotional well-being: 32             -Social functioning: 25             -Pain: 22.5             -General health: 45   Pre-operative Scores   ODI: 37 VAS back: 8 VAS leg: 8 SF-36:             -Physical functioning: 5             -Role limitations due to physical health: 0             -Role limitations due to emotional problems: 0             -Energy/fatigue: 15             -Emotional well-being: 32             -Social functioning: 25             -Pain: 22.5             -General health: 9    London Sheer, MD Orthopedic Surgeon

## 2023-05-29 NOTE — Progress Notes (Addendum)
Orthopedic Surgery Post-operative Office Visit   Procedure: L5/S1 ALIF and PSIF Date of Surgery: 03/06/2023 (~3 months post-op)   Assessment: Patient is a 57 y.o. who is doing well after L5/S1 ALIF and PSIF     Plan: -Operative plans complete -Out of bed as tolerated, no brace -No spine specific precautions -Okay to submerge wounds -Pain management: OTC medications, prescribed robaxin -Referral provided to PT for his low back pain -Return to office in 3 months, lumbar x-rays needed at next visit: AP/lateral/flex/ex lumbar   ___________________________________________________________________________     Subjective: Patient had been noticing improvements until about 1 month ago. He noted significant lower back pain at that time. There was no trauma or injury that preceded the onset of pain. He had to limit his activities as a result of the pain. He has been having right knee pain around his old fracture site. Dr. Carola Frost has been seeing his for that and is tentatively planning on taking out the fixation for possible total knee arthroplasty. He is not having any pain radiating down the back of his legs like he was before surgery. His back pain has gotten slightly better since onset of about 1 month ago. Denies paresthesias and numbness. Has not noticed any redness or drainage around his incision.    Objective:   General: no acute distress, appropriate affect Neurologic: alert, answering questions appropriately, following commands Respiratory: unlabored breathing on room air Skin: Both incisions are well healed with no erythema, induration, or active/expressible drainage   MSK (spine):   -Strength exam                                                   Left                  Right   EHL                              5/5                  5/5 TA                                 5/5                  5/5 GSC                             5/5                  5/5 Knee extension            5/5                   5/5 Hip flexion                    5/5                  5/5   -Sensory exam                           Sensation intact to light touch in L3-S1 nerve distributions of bilateral lower  extremities   Imaging: X-rays of the lumbar spine taken 05/29/2023 were independently reviewed and interpreted, showing posterior instrumentation at L5 and S1 with no lucency around the screws. The screws have not backed out. The right L5 screw appears lateral. No evidence of instability on flexion/extension views. ALIF cage is in place with no lucency around it. No subsidence seen. Cage is in same position as immediate post-operative films from 03/06/2023.      Patient name: Harold Smith Patient MRN: [id number] Date of visit: 05/29/23

## 2023-05-31 ENCOUNTER — Telehealth: Payer: Self-pay

## 2023-05-31 NOTE — Telephone Encounter (Signed)
Did referral for Voquezna 20mg  through cover my meds waiting on response from insurance company

## 2023-06-01 NOTE — Telephone Encounter (Signed)
Insurance company denied the Voquezna 20mg  . We can write a letter for a appeal to [phone] mark as urgent. Include your name, Member ID, group name, comments, records

## 2023-06-02 ENCOUNTER — Encounter: Payer: Self-pay | Admitting: Gastroenterology

## 2023-06-02 MED ORDER — OMEPRAZOLE 40 MG PO CPDR: 40.0000 mg | DELAYED_RELEASE_CAPSULE | Freq: Two times a day (BID) | ORAL | 2 refills | Status: DC

## 2023-06-02 NOTE — Telephone Encounter (Signed)
Let's increase his omeprazole from 40 mg once a day to twice daily before meals for 3 months, repeat EGD in 3 months Persistent erosive esophagitis.  If it is still there, will do an appeal for voquezna  RV

## 2023-06-02 NOTE — Addendum Note (Signed)
Addended by: Radene Knee L on: 06/02/2023 11:30 AM   Modules accepted: Orders

## 2023-06-02 NOTE — Telephone Encounter (Signed)
Patient verbalized understanding sent medication to [facility] and put a reminder for repeat EGD

## 2023-06-07 ENCOUNTER — Telehealth: Payer: Self-pay

## 2023-06-07 ENCOUNTER — Encounter: Payer: Self-pay | Admitting: Orthopedic Surgery

## 2023-06-07 NOTE — Telephone Encounter (Signed)
Submitted a PA through cover my meds for Omeprazole 40mg  2 times a day. Approved today Your PA request has been approved. Additional information will be provided in the approval communication. (Message 1145) Authorization Expiration Date: 06/06/2024

## 2023-06-09 MED ORDER — OXYCODONE HCL 5 MG PO TABS: 5.0000 mg | ORAL_TABLET | ORAL | 0 refills | Status: AC | PRN
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
## 2023-06-13 ENCOUNTER — Encounter: Payer: Self-pay | Admitting: Physical Therapy

## 2023-06-13 ENCOUNTER — Ambulatory Visit: Payer: 59 | Attending: Orthopedic Surgery | Admitting: Physical Therapy

## 2023-06-13 DIAGNOSIS — M5416 Radiculopathy, lumbar region: Secondary | ICD-10-CM | POA: Insufficient documentation

## 2023-06-13 DIAGNOSIS — Z981 Arthrodesis status: Secondary | ICD-10-CM | POA: Insufficient documentation

## 2023-06-13 DIAGNOSIS — M5459 Other low back pain: Secondary | ICD-10-CM | POA: Insufficient documentation

## 2023-06-13 DIAGNOSIS — R262 Difficulty in walking, not elsewhere classified: Secondary | ICD-10-CM | POA: Diagnosis not present
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
## 2023-06-15 ENCOUNTER — Encounter: Payer: 59 | Admitting: Physical Therapy

## 2023-06-15 ENCOUNTER — Encounter: Payer: Self-pay | Admitting: Physical Therapy

## 2023-06-15 ENCOUNTER — Ambulatory Visit: Payer: 59 | Admitting: Physical Therapy

## 2023-06-15 VITALS — BP 155/91 | HR 67

## 2023-06-15 DIAGNOSIS — Z981 Arthrodesis status: Secondary | ICD-10-CM | POA: Diagnosis not present

## 2023-06-15 DIAGNOSIS — M5416 Radiculopathy, lumbar region: Secondary | ICD-10-CM | POA: Diagnosis not present

## 2023-06-15 DIAGNOSIS — R262 Difficulty in walking, not elsewhere classified: Secondary | ICD-10-CM | POA: Diagnosis not present

## 2023-06-15 DIAGNOSIS — M5459 Other low back pain: Secondary | ICD-10-CM

## 2023-06-19 ENCOUNTER — Encounter: Payer: Self-pay | Admitting: Physical Therapy

## 2023-06-19 ENCOUNTER — Encounter: Payer: 59 | Admitting: Physical Therapy

## 2023-06-19 ENCOUNTER — Ambulatory Visit: Payer: 59 | Admitting: Physical Therapy

## 2023-06-19 DIAGNOSIS — R262 Difficulty in walking, not elsewhere classified: Secondary | ICD-10-CM

## 2023-06-19 DIAGNOSIS — M5416 Radiculopathy, lumbar region: Secondary | ICD-10-CM

## 2023-06-19 DIAGNOSIS — M5459 Other low back pain: Secondary | ICD-10-CM | POA: Diagnosis not present

## 2023-06-19 DIAGNOSIS — Z981 Arthrodesis status: Secondary | ICD-10-CM | POA: Diagnosis not present
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
## 2023-06-20 ENCOUNTER — Encounter (HOSPITAL_COMMUNITY): Payer: Self-pay | Admitting: Orthopedic Surgery

## 2023-06-20 ENCOUNTER — Other Ambulatory Visit: Payer: Self-pay

## 2023-06-20 NOTE — Progress Notes (Signed)
[name] denies chest pain or shortness of breath. Patient denies having any s/s of Covid in his household, also denies any known exposure to Covid. [name] denies  any s/s of upper or lower respiratory in the past 8 weeks.   [name] PCP is Dr. Tillman Abide.

## 2023-06-20 NOTE — Telephone Encounter (Signed)
Referral was faxed to [facility] pain

## 2023-06-21 ENCOUNTER — Ambulatory Visit: Payer: 59 | Admitting: Physical Therapy

## 2023-06-21 ENCOUNTER — Encounter: Payer: 59 | Admitting: Physical Therapy

## 2023-06-21 ENCOUNTER — Encounter: Payer: Self-pay | Admitting: Physical Therapy

## 2023-06-21 DIAGNOSIS — R262 Difficulty in walking, not elsewhere classified: Secondary | ICD-10-CM | POA: Diagnosis not present

## 2023-06-21 DIAGNOSIS — M5459 Other low back pain: Secondary | ICD-10-CM

## 2023-06-21 DIAGNOSIS — M5416 Radiculopathy, lumbar region: Secondary | ICD-10-CM | POA: Diagnosis not present

## 2023-06-21 DIAGNOSIS — Z981 Arthrodesis status: Secondary | ICD-10-CM | POA: Diagnosis not present
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
## 2023-06-21 NOTE — H&P (Signed)
Orthopaedic Trauma Service (OTS) Consult   Patient ID: Harold Smith MRN: [id number] DOB/AGE: 06/01/1966 57 y.o.   HPI: Harold Smith is an 57 y.o. male s/p ORIF R medial femoral condyle fracture 08/2022 also s/p lumbar decompression and fusion by Dr. Christell Constant 02/2023 who presents today for [facility] R distal femur and R knee arthroscopy.  Pt has been followed closely. His fracture has healed but he continues to have progressive R knee pain. He is unable to take NSAIDs due to recent dx of esophagitis and gastritis.  Pt presents today for procedures noted above.  Risks and benefits reviewed with the patient and he wishes to proceed  Past Medical History:  Diagnosis Date   Allergic rhinitis due to pollen    Anemia    Anxiety    Chronic pain    Colon polyps    Depression    Diverticulitis large intestine    GERD (gastroesophageal reflux disease)    HTN (hypertension)    Irritable bowel syndrome    Metabolic bone disease 09/01/2022   Obesity    Osteoarthritis, knee    Sleep apnea    does not use cpap, pt states he no longer needed CPAP due to weight loss   Sleep disturbance    Vitamin D deficiency 09/01/2022    Past Surgical History:  Procedure Laterality Date   ABDOMINAL EXPOSURE N/A 03/06/2023   Procedure: ABDOMINAL EXPOSURE;  Surgeon: Cephus Shelling, MD;  Location: [facility] OR;  Service: Vascular;  Laterality: N/A;   ACHILLES TENDON REPAIR Right 12/26/2006   ANTERIOR LUMBAR FUSION N/A 03/06/2023   Procedure: L5-S1 ANTERIOR LUMBAR FUSION 1 LEVEL;  Surgeon: London Sheer, MD;  Location: [facility] OR;  Service: Orthopedics;  Laterality: N/A;   CATARACT EXTRACTION W/PHACO Left 07/20/2021   Procedure: CATARACT EXTRACTION PHACO AND INTRAOCULAR LENS PLACEMENT (IOC) LEFT 2.11 00:24.0;  Surgeon: Galen Manila, MD;  Location: [facility];  Service: Ophthalmology;  Laterality: Left;  sleep apnea   CATARACT EXTRACTION W/PHACO Right 08/03/2021   Procedure: CATARACT  EXTRACTION PHACO AND INTRAOCULAR LENS PLACEMENT (IOC) RIGHT;  Surgeon: Galen Manila, MD;  Location: [facility];  Service: Ophthalmology;  Laterality: Right;  3.19 0:33.0   CHOLECYSTECTOMY     COLON RESECTION Left 12/26/2004   due to diverticular disease at [facility]   COLONOSCOPY  2013?   COLONOSCOPY N/A 11/30/2022   Procedure: COLONOSCOPY;  Surgeon: Toney Reil, MD;  Location: [facility] ENDOSCOPY;  Service: Gastroenterology;  Laterality: N/A;   COLONOSCOPY WITH PROPOFOL N/A 05/22/2018   Procedure: COLONOSCOPY WITH PROPOFOL;  Surgeon: Toney Reil, MD;  Location: [facility] ENDOSCOPY;  Service: Gastroenterology;  Laterality: N/A;   COLONOSCOPY WITH PROPOFOL N/A 10/22/2020   Procedure: COLONOSCOPY WITH PROPOFOL;  Surgeon: Toney Reil, MD;  Location: [facility];  Service: Endoscopy;  Laterality: N/A;  priority 4   COLONOSCOPY WITH PROPOFOL N/A 11/10/2021   Procedure: COLONOSCOPY WITH PROPOFOL;  Surgeon: Toney Reil, MD;  Location: [facility] ENDOSCOPY;  Service: Gastroenterology;  Laterality: N/A;   COLONOSCOPY WITH PROPOFOL N/A 06/16/2022   Procedure: COLONOSCOPY WITH PROPOFOL;  Surgeon: Toney Reil, MD;  Location: [facility];  Service: Endoscopy;  Laterality: N/A;   COLONOSCOPY WITH PROPOFOL N/A 11/29/2022   Procedure: COLONOSCOPY WITH PROPOFOL;  Surgeon: Toney Reil, MD;  Location: [facility] ENDOSCOPY;  Service: Gastroenterology;  Laterality: N/A;   DRUG INDUCED ENDOSCOPY N/A 06/10/2019   Procedure: DRUG INDUCED SLEEP ENDOSCOPY;  Surgeon: Osborn Coho,  MD;  Location: [facility];  Service: ENT;  Laterality: N/A;   ESOPHAGOGASTRODUODENOSCOPY (EGD) WITH PROPOFOL N/A 06/16/2022   Procedure: ESOPHAGOGASTRODUODENOSCOPY (EGD) WITH PROPOFOL;  Surgeon: Toney Reil, MD;  Location: [facility];  Service: Endoscopy;  Laterality: N/A;   ESOPHAGOGASTRODUODENOSCOPY (EGD) WITH PROPOFOL N/A 05/24/2023   Procedure:  ESOPHAGOGASTRODUODENOSCOPY (EGD) WITH PROPOFOL;  Surgeon: Toney Reil, MD;  Location: [facility] ENDOSCOPY;  Service: Gastroenterology;  Laterality: N/A;   GASTRIC BYPASS N/A 12/27/2007   HIATAL HERNIA REPAIR  09/25/2014   at [facility]   ORIF FEMUR FRACTURE Right 08/30/2022   Procedure: OPEN REDUCTION INTERNAL FIXATION (ORIF) DISTAL FEMUR FRACTURE;  Surgeon: Myrene Galas, MD;  Location: [facility] OR;  Service: Orthopedics;  Laterality: Right;   POLYPECTOMY  10/22/2020   Procedure: POLYPECTOMY;  Surgeon: Toney Reil, MD;  Location: [facility];  Service: Endoscopy;;   POLYPECTOMY  06/16/2022   Procedure: POLYPECTOMY;  Surgeon: Toney Reil, MD;  Location: [facility];  Service: Endoscopy;;   ROTATOR CUFF REPAIR Right    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 He has been exposed to tobacco smoke. He has never used smokeless tobacco. He reports that he does not currently use alcohol. He reports that he does not use drugs.  Allergies: No Known Allergies  Medications: I have reviewed the patient's current medications. Current Outpatient Medications  Medication Instructions   alendronate (FOSAMAX) 70 mg, Oral, Weekly, Take with a full glass of water on an empty stomach.   CALCIUM PO 1 tablet, Oral, Daily   cetirizine (ZYRTEC) 10 mg, Oral, Daily   Cholecalciferol 125 MCG (5000 UT) TABS Take 1 tablet by mouth daily.   Cyanocobalamin (VITAMIN B-12 PO) 1 tablet, Oral, Daily    dicyclomine (BENTYL) 10 MG capsule Take 1 to 2 capsules as needed for abdominal cramps and diarrhea every 6 to 8 hours   DULoxetine (CYMBALTA) 60 mg, Oral, Daily   FEROSUL 325 (65 Fe) MG tablet TAKE 1 TABLET BY MOUTH DAILY WITH BREAKFAST   fluticasone (FLONASE) 50 MCG/ACT nasal spray 2 sprays, Each Nare, Daily   gabapentin (NEURONTIN) 300 MG capsule TAKE 1 CAPSULE BY MOUTH TWICE DAILY AND TAKE 4 CAPSULES AT BEDTIME   hydrochlorothiazide (HYDRODIURIL) 25 mg, Oral, Daily   hydrocortisone (ANUSOL-HC) 25 mg, Rectal, Daily   lipase/protease/amylase (CREON) 36000 UNITS CPEP capsule Take 2 capsules (72,000 Units total) by mouth 3 (three) times daily with meals. May also take 1 capsule (36,000 Units total) as needed (with snacks).   lisinopril (ZESTRIL) 20 mg, Oral, Daily   meloxicam (MOBIC) 15 mg, Oral, Daily   methocarbamol (ROBAXIN) 500 mg, Oral, Every 8 hours PRN   Multiple Vitamin (MULTIVITAMIN) tablet 1 tablet, Oral, Daily   omeprazole (PRILOSEC) 40 mg, Oral, 2 times daily before meals   oxyCODONE-acetaminophen (  PERCOCET/ROXICET) 5-325 MG tablet 1 tablet, Oral, 2 times daily PRN   polyethylene glycol powder (GLYCOLAX/MIRALAX) 17 GM/SCOOP powder Take 1 capful (17 g) by mouth daily.   QUEtiapine (SEROQUEL) 25-50 mg, Oral, At bedtime PRN   sildenafil (REVATIO) 20 MG tablet Take 3-5 tablets as needed 1 hour prior to intercourse   Sodium Fluoride (PREVIDENT 5000 BOOSTER PLUS DT) 1 application , dental, 2 times daily   tadalafil (CIALIS) 20 mg, Oral, Daily PRN   traZODone (DESYREL) 50 mg, Oral, At bedtime PRN   Zinc Sulfate 220 mg, Oral, Daily   zolpidem (AMBIEN) 10 mg, Oral, At bedtime PRN     No results found for this or any previous visit (from the past 48 hour(s)).  No results found.  Intake/Output    None      Review of Systems  Constitutional:  Negative for chills and fever.  Respiratory:  Negative for shortness of breath.   Cardiovascular:  Negative for chest pain and  palpitations.  Gastrointestinal:  Negative for nausea and vomiting.  Musculoskeletal:        R knee pain    There were no vitals taken for this visit. Physical Exam Constitutional:      General: He is not in acute distress.    Appearance: Normal appearance.  Eyes:     Extraocular Movements: Extraocular movements intact.     Pupils: Pupils are equal, round, and reactive to light.  Cardiovascular:     Rate and Rhythm: Normal rate.  Pulmonary:     Effort: Pulmonary effort is normal.  Musculoskeletal:     Comments: Right Lower Extremity  Medial R knee incision well healed Tender right medial distal femoral condyle over hardware Medial joint line tenderness and pain with mcmurrays test Small effusion  No erythema  Motor and sensory functions intact distally  + DP pulse No pitting edema  Skin:    General: Skin is warm.  Neurological:     General: No focal deficit present.     Mental Status: He is alert and oriented to person, place, and time.  Psychiatric:        Mood and Affect: Mood normal.        Thought Content: Thought content normal.             Assessment/Plan:  57 y/o male s/p ORIF R medial femoral condyle fracture with symptomatic hardware and progressive R knee pain   -symptomatic hardware R distal femur and internal derangement R knee  OR for [facility] R distal femur and R knee arthroscopy  WBAT post op, may need crutches for a few days  ROM as tolerated post op  Outpt surgery   Risks and benefits reviewed with pt and he wishes to proceed   - Pain management:  Multimodal   - Dispo:  OR for procedures noted above  Follow up with ortho 2 weeks post op for post op check     Mearl Latin, PA-C [phone] (C) 06/21/2023, 9:12 AM  [facility] [address] [place] [address] [phone] [phone] (F)    After 5pm and on the weekends please log on to Amion, go to orthopaedics and the look under the Sports Medicine  Group Call for the provider(s) on call. You can also call our office at [phone] and then follow the prompts to be connected to the call team.

## 2023-06-22 ENCOUNTER — Encounter: Payer: Self-pay | Admitting: Oncology

## 2023-06-22 ENCOUNTER — Ambulatory Visit (HOSPITAL_COMMUNITY): Payer: 59

## 2023-06-22 ENCOUNTER — Encounter (HOSPITAL_COMMUNITY)
Admission: RE | Disposition: A | Discharge status: Home / Self Care | Payer: Self-pay | Source: Ambulatory Visit | Attending: Orthopedic Surgery

## 2023-06-22 ENCOUNTER — Ambulatory Visit (HOSPITAL_BASED_OUTPATIENT_CLINIC_OR_DEPARTMENT_OTHER): Payer: 59 | Admitting: Certified Registered Nurse Anesthetist

## 2023-06-22 ENCOUNTER — Other Ambulatory Visit (HOSPITAL_COMMUNITY): Payer: Self-pay

## 2023-06-22 ENCOUNTER — Encounter (HOSPITAL_COMMUNITY): Payer: Self-pay | Admitting: Orthopedic Surgery

## 2023-06-22 ENCOUNTER — Other Ambulatory Visit: Payer: Self-pay

## 2023-06-22 ENCOUNTER — Ambulatory Visit (HOSPITAL_COMMUNITY): Payer: 59 | Admitting: Certified Registered Nurse Anesthetist

## 2023-06-22 ENCOUNTER — Ambulatory Visit (HOSPITAL_COMMUNITY)
Admission: RE | Admit: 2023-06-22 | Discharge: 2023-06-22 | Disposition: A | Discharge status: Home / Self Care | Payer: 59 | Source: Ambulatory Visit | Attending: Orthopedic Surgery | Admitting: Orthopedic Surgery

## 2023-06-22 DIAGNOSIS — M2391 Unspecified internal derangement of right knee: Secondary | ICD-10-CM | POA: Diagnosis not present

## 2023-06-22 DIAGNOSIS — T8484XA Pain due to internal orthopedic prosthetic devices, implants and grafts, initial encounter: Secondary | ICD-10-CM | POA: Diagnosis not present

## 2023-06-22 DIAGNOSIS — M659 Synovitis and tenosynovitis, unspecified: Secondary | ICD-10-CM | POA: Diagnosis not present

## 2023-06-22 DIAGNOSIS — Z6824 Body mass index (BMI) 24.0-24.9, adult: Secondary | ICD-10-CM | POA: Insufficient documentation

## 2023-06-22 DIAGNOSIS — E669 Obesity, unspecified: Secondary | ICD-10-CM | POA: Insufficient documentation

## 2023-06-22 DIAGNOSIS — Z9884 Bariatric surgery status: Secondary | ICD-10-CM | POA: Insufficient documentation

## 2023-06-22 DIAGNOSIS — Z472 Encounter for removal of internal fixation device: Secondary | ICD-10-CM | POA: Diagnosis not present

## 2023-06-22 DIAGNOSIS — I10 Essential (primary) hypertension: Secondary | ICD-10-CM | POA: Insufficient documentation

## 2023-06-22 DIAGNOSIS — G4730 Sleep apnea, unspecified: Secondary | ICD-10-CM | POA: Insufficient documentation

## 2023-06-22 DIAGNOSIS — Z4589 Encounter for adjustment and management of other implanted devices: Secondary | ICD-10-CM | POA: Insufficient documentation

## 2023-06-22 DIAGNOSIS — M1711 Unilateral primary osteoarthritis, right knee: Secondary | ICD-10-CM | POA: Diagnosis not present

## 2023-06-22 DIAGNOSIS — F17290 Nicotine dependence, other tobacco product, uncomplicated: Secondary | ICD-10-CM | POA: Diagnosis not present

## 2023-06-22 DIAGNOSIS — S83281A Other tear of lateral meniscus, current injury, right knee, initial encounter: Secondary | ICD-10-CM | POA: Diagnosis not present

## 2023-06-22 DIAGNOSIS — K219 Gastro-esophageal reflux disease without esophagitis: Secondary | ICD-10-CM | POA: Insufficient documentation

## 2023-06-22 DIAGNOSIS — M67861 Other specified disorders of synovium, right knee: Secondary | ICD-10-CM | POA: Diagnosis not present

## 2023-06-22 DIAGNOSIS — Z01818 Encounter for other preprocedural examination: Secondary | ICD-10-CM

## 2023-06-22 HISTORY — DX: Anemia, unspecified: D64.9

## 2023-06-22 HISTORY — PX: KNEE ARTHROSCOPY: SHX127

## 2023-06-22 HISTORY — PX: HARDWARE REMOVAL: SHX979

## 2023-06-22 LAB — CBC WITH DIFFERENTIAL/PLATELET
Abs Immature Granulocytes: 0.05 10*3/uL (ref 0.00–0.07)
Basophils Absolute: 0 10*3/uL (ref 0.0–0.1)
Basophils Relative: 0 %
Eosinophils Absolute: 0.1 10*3/uL (ref 0.0–0.5)
Eosinophils Relative: 1 %
HCT: 39.8 % (ref 39.0–52.0)
Hemoglobin: 13 g/dL (ref 13.0–17.0)
Immature Granulocytes: 0 %
Lymphocytes Relative: 12 %
Lymphs Abs: 1.4 10*3/uL (ref 0.7–4.0)
MCH: 31.3 pg (ref 26.0–34.0)
MCHC: 32.7 g/dL (ref 30.0–36.0)
MCV: 95.9 fL (ref 80.0–100.0)
Monocytes Absolute: 0.8 10*3/uL (ref 0.1–1.0)
Monocytes Relative: 7 %
Neutro Abs: 9.4 10*3/uL — ABNORMAL HIGH (ref 1.7–7.7)
Neutrophils Relative %: 80 %
Platelets: 245 10*3/uL (ref 150–400)
RBC: 4.15 MIL/uL — ABNORMAL LOW (ref 4.22–5.81)
RDW: 13.9 % (ref 11.5–15.5)
WBC: 11.8 10*3/uL — ABNORMAL HIGH (ref 4.0–10.5)
nRBC: 0 % (ref 0.0–0.2)

## 2023-06-22 LAB — COMPREHENSIVE METABOLIC PANEL
ALT: 42 U/L (ref 0–44)
AST: 29 U/L (ref 15–41)
Albumin: 3.2 g/dL — ABNORMAL LOW (ref 3.5–5.0)
Alkaline Phosphatase: 71 U/L (ref 38–126)
Anion gap: 7 (ref 5–15)
BUN: 14 mg/dL (ref 6–20)
CO2: 21 mmol/L — ABNORMAL LOW (ref 22–32)
Calcium: 8.5 mg/dL — ABNORMAL LOW (ref 8.9–10.3)
Chloride: 111 mmol/L (ref 98–111)
Creatinine, Ser: 0.89 mg/dL (ref 0.61–1.24)
GFR, Estimated: >60 mL/min (ref >60)
Glucose, Bld: 91 mg/dL (ref 70–99)
Potassium: 3.9 mmol/L (ref 3.5–5.1)
Sodium: 139 mmol/L (ref 135–145)
Total Bilirubin: 0.4 mg/dL (ref 0.3–1.2)
Total Protein: 5.9 g/dL — ABNORMAL LOW (ref 6.5–8.1)

## 2023-06-22 LAB — SURGICAL PCR SCREEN
MRSA, PCR: NEGATIVE
Staphylococcus aureus: NEGATIVE

## 2023-06-22 LAB — NO BLOOD PRODUCTS

## 2023-06-22 SURGERY — REMOVAL, HARDWARE
Anesthesia: General | Site: Knee | Laterality: Right

## 2023-06-22 MED ORDER — BUPIVACAINE-EPINEPHRINE 0.25% -1:200000 IJ SOLN
INTRAMUSCULAR | Status: DC | PRN
  Administered 2023-06-22: 11 mL
  Administered 2023-06-22: 19 mL

## 2023-06-22 MED ORDER — PROPOFOL 10 MG/ML IV BOLUS
INTRAVENOUS | Status: AC
  Filled 2023-06-22: qty 20

## 2023-06-22 MED ORDER — ORAL CARE MOUTH RINSE: 15.0000 mL | Freq: Once | OROMUCOSAL | Status: AC

## 2023-06-22 MED ORDER — CYCLOBENZAPRINE HCL 5 MG PO TABS
5.0000 mg | ORAL_TABLET | Freq: Three times a day (TID) | ORAL | 0 refills | Status: DC | PRN
  Filled 2023-06-22: qty 40, 7d supply, fill #0

## 2023-06-22 MED ORDER — OXYCODONE-ACETAMINOPHEN 5-325 MG PO TABS
1.0000 | ORAL_TABLET | Freq: Four times a day (QID) | ORAL | 0 refills | Status: DC | PRN
  Filled 2023-06-22: qty 50, 7d supply, fill #0

## 2023-06-22 MED ORDER — POVIDONE-IODINE 10 % EX SWAB
2.0000 | Freq: Once | CUTANEOUS | Status: AC
  Administered 2023-06-22: 2 via TOPICAL

## 2023-06-22 MED ORDER — FENTANYL CITRATE (PF) 250 MCG/5ML IJ SOLN
INTRAMUSCULAR | Status: AC
  Filled 2023-06-22: qty 5

## 2023-06-22 MED ORDER — SUGAMMADEX SODIUM 200 MG/2ML IV SOLN
INTRAVENOUS | Status: DC | PRN
  Administered 2023-06-22: 163.2 mg via INTRAVENOUS

## 2023-06-22 MED ORDER — FENTANYL CITRATE (PF) 250 MCG/5ML IJ SOLN
INTRAMUSCULAR | Status: DC | PRN
  Administered 2023-06-22: 50 ug via INTRAVENOUS
  Administered 2023-06-22: 100 ug via INTRAVENOUS
  Administered 2023-06-22 (×2): 50 ug via INTRAVENOUS

## 2023-06-22 MED ORDER — LACTATED RINGERS IV SOLN: INTRAVENOUS | Status: DC

## 2023-06-22 MED ORDER — ROCURONIUM BROMIDE 10 MG/ML (PF) SYRINGE
PREFILLED_SYRINGE | INTRAVENOUS | Status: AC
  Filled 2023-06-22: qty 10

## 2023-06-22 MED ORDER — ROCURONIUM BROMIDE 10 MG/ML (PF) SYRINGE
PREFILLED_SYRINGE | INTRAVENOUS | Status: DC | PRN
  Administered 2023-06-22 (×2): 20 mg via INTRAVENOUS
  Administered 2023-06-22: 30 mg via INTRAVENOUS
  Administered 2023-06-22: 50 mg via INTRAVENOUS

## 2023-06-22 MED ORDER — DEXAMETHASONE SODIUM PHOSPHATE 10 MG/ML IJ SOLN
INTRAMUSCULAR | Status: AC
  Filled 2023-06-22: qty 1

## 2023-06-22 MED ORDER — CHLORHEXIDINE GLUCONATE 0.12 % MT SOLN: 15.0000 mL | Freq: Once | OROMUCOSAL | Status: AC

## 2023-06-22 MED ORDER — OXYCODONE HCL 5 MG PO TABS: 5.0000 mg | ORAL_TABLET | Freq: Once | ORAL | Status: DC | PRN

## 2023-06-22 MED ORDER — HYDROMORPHONE HCL 1 MG/ML IJ SOLN
INTRAMUSCULAR | Status: DC | PRN
  Administered 2023-06-22 (×5): .1 mg via INTRAVENOUS

## 2023-06-22 MED ORDER — CHLORHEXIDINE GLUCONATE 0.12 % MT SOLN
OROMUCOSAL | Status: AC
  Administered 2023-06-22: 15 mL
  Filled 2023-06-22: qty 15

## 2023-06-22 MED ORDER — DEXAMETHASONE SODIUM PHOSPHATE 10 MG/ML IJ SOLN
INTRAMUSCULAR | Status: DC | PRN
  Administered 2023-06-22: 10 mg via INTRAVENOUS

## 2023-06-22 MED ORDER — MIDAZOLAM HCL 5 MG/5ML IJ SOLN
INTRAMUSCULAR | Status: DC | PRN
  Administered 2023-06-22: 2 mg via INTRAVENOUS

## 2023-06-22 MED ORDER — ONDANSETRON HCL 4 MG/2ML IJ SOLN
INTRAMUSCULAR | Status: AC
  Filled 2023-06-22: qty 2

## 2023-06-22 MED ORDER — SODIUM CHLORIDE 0.9 % IR SOLN
Status: DC | PRN
  Administered 2023-06-22 (×2): 3000 mL

## 2023-06-22 MED ORDER — CEFAZOLIN SODIUM-DEXTROSE 2-4 GM/100ML-% IV SOLN
2.0000 g | INTRAVENOUS | Status: AC
  Administered 2023-06-22: 2 g via INTRAVENOUS
  Filled 2023-06-22: qty 100

## 2023-06-22 MED ORDER — MIDAZOLAM HCL 2 MG/2ML IJ SOLN
INTRAMUSCULAR | Status: AC
  Filled 2023-06-22: qty 2

## 2023-06-22 MED ORDER — LIDOCAINE 2% (20 MG/ML) 5 ML SYRINGE
INTRAMUSCULAR | Status: AC
  Filled 2023-06-22: qty 5

## 2023-06-22 MED ORDER — ONDANSETRON HCL 4 MG/2ML IJ SOLN
INTRAMUSCULAR | Status: DC | PRN
  Administered 2023-06-22: 4 mg via INTRAVENOUS

## 2023-06-22 MED ORDER — LIDOCAINE 2% (20 MG/ML) 5 ML SYRINGE
INTRAMUSCULAR | Status: DC | PRN
  Administered 2023-06-22: 60 mg via INTRAVENOUS

## 2023-06-22 MED ORDER — ONDANSETRON 4 MG PO TBDP
4.0000 mg | ORAL_TABLET | Freq: Three times a day (TID) | ORAL | 0 refills | Status: DC | PRN
  Filled 2023-06-22: qty 18, 6d supply, fill #0

## 2023-06-22 MED ORDER — ACETAMINOPHEN 500 MG PO TABS
1000.0000 mg | ORAL_TABLET | Freq: Once | ORAL | Status: AC
  Administered 2023-06-22: 1000 mg via ORAL
  Filled 2023-06-22: qty 2

## 2023-06-22 MED ORDER — 0.9 % SODIUM CHLORIDE (POUR BTL) OPTIME
TOPICAL | Status: DC | PRN
  Administered 2023-06-22: 1000 mL

## 2023-06-22 MED ORDER — CHLORHEXIDINE GLUCONATE 4 % EX SOLN: 60.0000 mL | Freq: Once | CUTANEOUS | Status: DC

## 2023-06-22 MED ORDER — AMISULPRIDE (ANTIEMETIC) 5 MG/2ML IV SOLN: 10.0000 mg | Freq: Once | INTRAVENOUS | Status: DC | PRN

## 2023-06-22 MED ORDER — KETAMINE HCL 50 MG/5ML IJ SOSY
PREFILLED_SYRINGE | INTRAMUSCULAR | Status: AC
  Filled 2023-06-22: qty 5

## 2023-06-22 MED ORDER — OXYCODONE HCL 5 MG/5ML PO SOLN: 5.0000 mg | Freq: Once | ORAL | Status: DC | PRN

## 2023-06-22 MED ORDER — HYDROMORPHONE HCL 1 MG/ML IJ SOLN
INTRAMUSCULAR | Status: AC
  Filled 2023-06-22: qty 0.5

## 2023-06-22 MED ORDER — FENTANYL CITRATE (PF) 100 MCG/2ML IJ SOLN: 25.0000 ug | INTRAMUSCULAR | Status: DC | PRN

## 2023-06-22 MED ORDER — PROPOFOL 10 MG/ML IV BOLUS
INTRAVENOUS | Status: DC | PRN
  Administered 2023-06-22: 200 mg via INTRAVENOUS

## 2023-06-22 MED ORDER — PHENYLEPHRINE HCL-NACL 20-0.9 MG/250ML-% IV SOLN
INTRAVENOUS | Status: DC | PRN
  Administered 2023-06-22: 25 ug/min via INTRAVENOUS

## 2023-06-22 MED ORDER — KETAMINE HCL 10 MG/ML IJ SOLN
INTRAMUSCULAR | Status: DC | PRN
  Administered 2023-06-22: 30 mg via INTRAVENOUS

## 2023-06-22 MED ORDER — BUPIVACAINE-EPINEPHRINE (PF) 0.25% -1:200000 IJ SOLN
INTRAMUSCULAR | Status: AC
  Filled 2023-06-22: qty 30

## 2023-06-22 SURGICAL SUPPLY — 87 items
BAG COUNTER SPONGE SURGICOUNT (BAG) ×1 IMPLANT
BAG SPNG CNTER NS LX DISP (BAG) ×1
BANDAGE ESMARK 6X9 LF (GAUZE/BANDAGES/DRESSINGS) ×1 IMPLANT
BLADE CUDA 5.5 (BLADE) IMPLANT
BLADE EXCALIBUR 4.0X13 (MISCELLANEOUS) ×1 IMPLANT
BLADE SURG 11 STRL SS (BLADE) ×1 IMPLANT
BNDG CMPR 5X4 KNIT ELC UNQ LF (GAUZE/BANDAGES/DRESSINGS) ×1
BNDG CMPR 5X6 CHSV STRCH STRL (GAUZE/BANDAGES/DRESSINGS) ×1
BNDG CMPR 5X62 HK CLSR LF (GAUZE/BANDAGES/DRESSINGS) ×1
BNDG CMPR 6 X 5 YARDS HK CLSR (GAUZE/BANDAGES/DRESSINGS) ×1
BNDG CMPR 9X6 STRL LF SNTH (GAUZE/BANDAGES/DRESSINGS)
BNDG COHESIVE 6X5 TAN ST LF (GAUZE/BANDAGES/DRESSINGS) ×1 IMPLANT
BNDG ELASTIC 4INX 5YD STR LF (GAUZE/BANDAGES/DRESSINGS) IMPLANT
BNDG ELASTIC 4X5.8 VLCR STR LF (GAUZE/BANDAGES/DRESSINGS) ×1 IMPLANT
BNDG ELASTIC 6INX 5YD STR LF (GAUZE/BANDAGES/DRESSINGS) IMPLANT
BNDG ELASTIC 6X5.8 VLCR STR LF (GAUZE/BANDAGES/DRESSINGS) ×2 IMPLANT
BNDG ESMARK 6X9 LF (GAUZE/BANDAGES/DRESSINGS)
BNDG GAUZE DERMACEA FLUFF 4 (GAUZE/BANDAGES/DRESSINGS) ×2 IMPLANT
BNDG GZE DERMACEA 4 6PLY (GAUZE/BANDAGES/DRESSINGS) ×1
BRUSH SCRUB EZ PLAIN DRY (MISCELLANEOUS) ×2 IMPLANT
CNTNR URN SCR LID CUP LEK RST (MISCELLANEOUS) IMPLANT
CONT SPEC 4OZ STRL OR WHT (MISCELLANEOUS) ×1
COVER SURGICAL LIGHT HANDLE (MISCELLANEOUS) ×2 IMPLANT
CUFF TOURN SGL QUICK 18X4 (TOURNIQUET CUFF) IMPLANT
CUFF TOURN SGL QUICK 24 (TOURNIQUET CUFF)
CUFF TOURN SGL QUICK 34 (TOURNIQUET CUFF)
CUFF TOURN SGL QUICK 42 (TOURNIQUET CUFF) IMPLANT
CUFF TRNQT CYL 24X4X16.5-23 (TOURNIQUET CUFF) IMPLANT
CUFF TRNQT CYL 34X4.125X (TOURNIQUET CUFF) IMPLANT
DRAPE ARTHROSCOPY W/POUCH 114 (DRAPES) ×1 IMPLANT
DRAPE C-ARM 42X120 X-RAY (DRAPES) IMPLANT
DRAPE C-ARM 42X72 X-RAY (DRAPES) IMPLANT
DRAPE C-ARMOR (DRAPES) ×1 IMPLANT
DRAPE U-SHAPE 47X51 STRL (DRAPES) ×1 IMPLANT
DRSG ADAPTIC 3X8 NADH LF (GAUZE/BANDAGES/DRESSINGS) ×1 IMPLANT
DRSG EMULSION OIL 3X3 NADH (GAUZE/BANDAGES/DRESSINGS) ×1 IMPLANT
ELECT REM PT RETURN 9FT ADLT (ELECTROSURGICAL) ×1
ELECTRODE REM PT RTRN 9FT ADLT (ELECTROSURGICAL) ×1 IMPLANT
EXCALIBUR 3.8MM X 13CM (MISCELLANEOUS) IMPLANT
GAUZE 4X4 16PLY ~~LOC~~+RFID DBL (SPONGE) ×1 IMPLANT
GAUZE PAD ABD 8X10 STRL (GAUZE/BANDAGES/DRESSINGS) ×2 IMPLANT
GAUZE SPONGE 4X4 12PLY STRL (GAUZE/BANDAGES/DRESSINGS) ×1 IMPLANT
GLOVE BIO SURGEON STRL SZ7.5 (GLOVE) ×1 IMPLANT
GLOVE BIO SURGEON STRL SZ8 (GLOVE) ×1 IMPLANT
GLOVE BIOGEL PI IND STRL 7.5 (GLOVE) ×1 IMPLANT
GLOVE BIOGEL PI IND STRL 8 (GLOVE) ×1 IMPLANT
GLOVE SURG ORTHO LTX SZ7.5 (GLOVE) ×2 IMPLANT
GOWN STRL REUS W/ TWL LRG LVL3 (GOWN DISPOSABLE) ×2 IMPLANT
GOWN STRL REUS W/ TWL XL LVL3 (GOWN DISPOSABLE) ×1 IMPLANT
GOWN STRL REUS W/TWL LRG LVL3 (GOWN DISPOSABLE) ×2
GOWN STRL REUS W/TWL XL LVL3 (GOWN DISPOSABLE) ×1
HYDROGEN PEROXIDE 16OZ (MISCELLANEOUS) IMPLANT
KIT BASIN OR (CUSTOM PROCEDURE TRAY) ×1 IMPLANT
KIT TURNOVER KIT B (KITS) ×1 IMPLANT
MANIFOLD NEPTUNE II (INSTRUMENTS) ×1 IMPLANT
NDL 22X1.5 STRL (OR ONLY) (MISCELLANEOUS) IMPLANT
NEEDLE 22X1.5 STRL (OR ONLY) (MISCELLANEOUS) ×1 IMPLANT
NS IRRIG 1000ML POUR BTL (IV SOLUTION) ×1 IMPLANT
PACK ARTHROSCOPY DSU (CUSTOM PROCEDURE TRAY) ×1 IMPLANT
PACK ORTHO EXTREMITY (CUSTOM PROCEDURE TRAY) ×1 IMPLANT
PAD ARMBOARD 7.5X6 YLW CONV (MISCELLANEOUS) ×2 IMPLANT
PAD CAST 4YDX4 CTTN HI CHSV (CAST SUPPLIES) IMPLANT
PADDING CAST COTTON 4X4 STRL (CAST SUPPLIES) ×1
PADDING CAST COTTON 6X4 STRL (CAST SUPPLIES) ×3 IMPLANT
PORT APPOLLO RF 90DEGREE MULTI (SURGICAL WAND) IMPLANT
SPONGE T-LAP 18X18 ~~LOC~~+RFID (SPONGE) ×1 IMPLANT
STAPLER VISISTAT 35W (STAPLE) IMPLANT
STOCKINETTE IMPERVIOUS LG (DRAPES) ×1 IMPLANT
STRIP CLOSURE SKIN 1/2X4 (GAUZE/BANDAGES/DRESSINGS) IMPLANT
SUCTION TUBE FRAZIER 10FR DISP (SUCTIONS) IMPLANT
SUT ETHILON 2 0 FS 18 (SUTURE) IMPLANT
SUT ETHILON 2 0 PSLX (SUTURE) IMPLANT
SUT ETHILON 4 0 PS 2 18 (SUTURE) ×1 IMPLANT
SUT PDS AB 0 CT1 27 (SUTURE) IMPLANT
SUT PDS AB 2-0 CT1 27 (SUTURE) IMPLANT
SUT VIC AB 0 CT1 27 (SUTURE)
SUT VIC AB 0 CT1 27XBRD ANBCTR (SUTURE) IMPLANT
SUT VIC AB 2-0 CT1 27 (SUTURE) ×1
SUT VIC AB 2-0 CT1 TAPERPNT 27 (SUTURE) IMPLANT
SYR CONTROL 10ML LL (SYRINGE) IMPLANT
TOWEL GREEN STERILE (TOWEL DISPOSABLE) ×2 IMPLANT
TOWEL GREEN STERILE FF (TOWEL DISPOSABLE) ×2 IMPLANT
TUBE CONNECTING 12X1/4 (SUCTIONS) ×1 IMPLANT
TUBING ARTHROSCOPY IRRIG 16FT (MISCELLANEOUS) ×1 IMPLANT
UNDERPAD 30X36 HEAVY ABSORB (UNDERPADS AND DIAPERS) ×1 IMPLANT
WATER STERILE IRR 1000ML POUR (IV SOLUTION) ×2 IMPLANT
YANKAUER SUCT BULB TIP NO VENT (SUCTIONS) ×1 IMPLANT

## 2023-06-22 NOTE — Discharge Instructions (Addendum)
Orthopaedic Trauma Service Discharge Instructions   General Discharge Instructions  WEIGHT BEARING STATUS: Weightbearing as tolerated Right Lower Extremity. May need to use crutches or walker for a few days   RANGE OF MOTION/ACTIVITY: unrestricted motion of right knee   Bone health: continue with vitamin d supplements  Review the following resource for additional information regarding bone health  [email or web address]  Wound Care: daily wound care starting on 06/24/2023.  See below   Discharge Wound Care Instructions  Do NOT apply any ointments, solutions or lotions to pin sites or surgical wounds.  These prevent needed drainage and even though solutions like hydrogen peroxide kill bacteria, they also damage cells lining the pin sites that help fight infection.  Applying lotions or ointments can keep the wounds moist and can cause them to breakdown and open up as well. This can increase the risk for infection. When in doubt call the office.  Surgical incisions should be dressed daily.  If any drainage is noted, use one layer of adaptic or Mepitel, then gauze, Kerlix, and an ace wrap.  [email or web address][employer]cz [email or web address]-[email or web address]/?pd_rd_[email or web address]&[email or web address]  [email or web address].[email or web address]  These dressing supplies should be available at local medical supply stores ([employer], [employer], etc). They are not usually carried at places like [facility], [facility], [employer], etc  Once the incision is completely dry and without drainage, it may be left open to air out.  Showering may begin 36-48 hours later.  Cleaning gently with soap and water.   Diet: as you were eating previously.  Can use over the counter stool softeners and bowel preparations, such as Miralax, to help with bowel movements.   Narcotics can be constipating.  Be sure to drink plenty of fluids  PAIN MEDICATION USE AND EXPECTATIONS  You have likely been given narcotic medications to help control your pain.  After a traumatic event that results in an fracture (broken bone) with or without surgery, it is ok to use narcotic pain medications to help control one's pain.  We understand that everyone responds to pain differently and each individual patient will be evaluated on a regular basis for the continued need for narcotic medications. Ideally, narcotic medication use should last no more than 6-8 weeks (coinciding with fracture healing).   As a patient it is your responsibility as well to monitor narcotic medication use and report the amount and frequency you use these medications when you come to your office visit.   We would also advise that if you are using narcotic medications, you should take a dose prior to therapy to maximize you participation.  IF YOU ARE ON NARCOTIC MEDICATIONS IT IS NOT PERMISSIBLE TO OPERATE A MOTOR VEHICLE (MOTORCYCLE/CAR/TRUCK/MOPED) OR HEAVY MACHINERY DO NOT MIX NARCOTICS WITH OTHER CNS (CENTRAL NERVOUS SYSTEM) DEPRESSANTS SUCH AS ALCOHOL   POST-OPERATIVE OPIOID TAPER INSTRUCTIONS: It is important to wean off of your opioid medication as soon as possible. If you do not need pain medication after your surgery it is ok to stop day one. Opioids include: Codeine, Hydrocodone(Norco, Vicodin), Oxycodone(Percocet, oxycontin) and hydromorphone amongst others.  Long term and even short term use of opiods can cause: Increased pain response Dependence Constipation Depression Respiratory depression And more.  Withdrawal symptoms can include Flu like symptoms Nausea, vomiting And more Techniques to manage these symptoms Hydrate well Eat regular healthy meals Stay active Use relaxation techniques(deep breathing, meditating, yoga) Do Not substitute Alcohol to help with tapering If you have been  on opioids for less than two weeks and do not have pain than  it is ok to stop all together.  Plan to wean off of opioids This plan should start within one week post op of your fracture surgery  Maintain the same interval or time between taking each dose and first decrease the dose.  Cut the total daily intake of opioids by one tablet each day Next start to increase the time between doses. The last dose that should be eliminated is the evening dose.    STOP SMOKING OR USING NICOTINE PRODUCTS!!!!  As discussed nicotine severely impairs your body's ability to heal surgical and traumatic wounds but also impairs bone healing.  Wounds and bone heal by forming microscopic blood vessels (angiogenesis) and nicotine is a vasoconstrictor (essentially, shrinks blood vessels).  Therefore, if vasoconstriction occurs to these microscopic blood vessels they essentially disappear and are unable to deliver necessary nutrients to the healing tissue.  This is one modifiable factor that you can do to dramatically increase your chances of healing your injury.    (This means no smoking, no nicotine gum, patches, etc)  DO NOT USE NONSTEROIDAL ANTI-INFLAMMATORY DRUGS (NSAID'S)  Using products such as Advil (ibuprofen), Aleve (naproxen), Motrin (ibuprofen) for additional pain control during fracture healing can delay and/or prevent the healing response.  If you would like to take over the counter (OTC) medication, Tylenol (acetaminophen) is ok.  However, some narcotic medications that are given for pain control contain acetaminophen as well. Therefore, you should not exceed more than 4000 mg of tylenol in a day if you do not have liver disease.  Also note that there are may OTC medicines, such as cold medicines and allergy medicines that my contain tylenol as well.  If you have any questions about medications and/or interactions please ask your doctor/PA or your pharmacist.      ICE AND ELEVATE INJURED/OPERATIVE  EXTREMITY  Using ice and elevating the injured extremity above your heart can help with swelling and pain control.  Icing in a pulsatile fashion, such as 20 minutes on and 20 minutes off, can be followed.    Do not place ice directly on skin. Make sure there is a barrier between to skin and the ice pack.    Using frozen items such as frozen peas works well as the conform nicely to the are that needs to be iced.  USE AN ACE WRAP OR TED HOSE FOR SWELLING CONTROL  In addition to icing and elevation, Ace wraps or TED hose are used to help limit and resolve swelling.  It is recommended to use Ace wraps or TED hose until you are informed to stop.    When using Ace Wraps start the wrapping distally (farthest away from the body) and wrap proximally (closer to the body)   Example: If you had surgery on your leg or thing and you do not have a splint on, start the ace wrap at the toes and work your way up to the thigh        If you had surgery on your upper extremity and do not have a splint on, start the ace wrap at your fingers and work your way up to the upper arm  IF YOU ARE IN A SPLINT OR CAST DO NOT REMOVE IT FOR ANY REASON   If your splint gets wet for any reason please contact the office immediately. You may shower in your splint or cast as long as you keep it dry.  This can be done by wrapping in a cast cover or garbage  back (or similar)  Do Not stick any thing down your splint or cast such as pencils, money, or hangers to try and scratch yourself with.  If you feel itchy take benadryl as prescribed on the bottle for itching  IF YOU ARE IN A CAM BOOT (BLACK BOOT)  You may remove boot periodically. Perform daily dressing changes as noted below.  Wash the liner of the boot regularly and wear a sock when wearing the boot. It is recommended that you sleep in the boot until told otherwise    Call office for the following: Temperature greater than 101F Persistent nausea and vomiting Severe  uncontrolled pain Redness, tenderness, or signs of infection (pain, swelling, redness, odor or green/yellow discharge around the site) Difficulty breathing, headache or visual disturbances Hives Persistent dizziness or light-headedness Extreme fatigue Any other questions or concerns you may have after discharge  In an emergency, call 911 or go to an Emergency Department at a nearby hospital  HELPFUL INFORMATION  If you had a block, it will wear off between 8-24 hrs postop typically.  This is period when your pain may go from nearly zero to the pain you would have had postop without the block.  This is an abrupt transition but nothing dangerous is happening.  You may take an extra dose of narcotic when this happens.  You should wean off your narcotic medicines as soon as you are able.  Most patients will be off or using minimal narcotics before their first postop appointment.   We suggest you use the pain medication the first night prior to going to bed, in order to ease any pain when the anesthesia wears off. You should avoid taking pain medications on an empty stomach as it will make you nauseous.  Do not drink alcoholic beverages or take illicit drugs when taking pain medications.  In most states it is against the law to drive while you are in a splint or sling.  And certainly against the law to drive while taking narcotics.  You may return to work/school in the next couple of days when you feel up to it.   Pain medication may make you constipated.  Below are a few solutions to try in this order: Decrease the amount of pain medication if you aren't having pain. Drink lots of decaffeinated fluids. Drink prune juice and/or each dried prunes  If the first 3 don't work start with additional solutions Take Colace - an over-the-counter stool softener Take Senokot - an over-the-counter laxative Take Miralax - a stronger over-the-counter laxative     CALL THE OFFICE WITH ANY QUESTIONS  OR CONCERNS: [phone]   VISIT OUR WEBSITE FOR ADDITIONAL INFORMATION: [email or web address]

## 2023-06-22 NOTE — Anesthesia Procedure Notes (Signed)
Procedure Name: Intubation Date/Time: 06/22/2023 12:42 PM  Performed by: Cy Blamer, CRNAPre-anesthesia Checklist: Patient identified, Emergency Drugs available, Suction available and Patient being monitored Patient Re-evaluated:Patient Re-evaluated prior to induction Oxygen Delivery Method: Circle system utilized Preoxygenation: Pre-oxygenation with 100% oxygen Induction Type: IV induction Ventilation: Mask ventilation without difficulty Laryngoscope Size: Miller and 2 Grade View: Grade II Tube type: Oral Tube size: 7.5 mm Number of attempts: 1 Airway Equipment and Method: Stylet and Bite block Placement Confirmation: ETT inserted through vocal cords under direct vision, positive ETCO2 and breath sounds checked- equal and bilateral Secured at: 22 cm Tube secured with: Tape Dental Injury: Teeth and Oropharynx as per pre-operative assessment

## 2023-06-22 NOTE — Anesthesia Preprocedure Evaluation (Signed)
Anesthesia Evaluation  Patient identified by MRN, date of birth, ID band Patient awake    Reviewed: Allergy & Precautions, NPO status , Patient's Chart, lab work & pertinent test results  Airway Mallampati: II  TM Distance: >3 FB Neck ROM: Full    Dental  (+) Dental Advisory Given   Pulmonary sleep apnea , Current Smoker and Patient abstained from smoking.   breath sounds clear to auscultation       Cardiovascular hypertension, Pt. on medications  Rhythm:Regular Rate:Normal     Neuro/Psych  Neuromuscular disease    GI/Hepatic Neg liver ROS, PUD,GERD  ,,  Endo/Other  negative endocrine ROS    Renal/GU negative Renal ROS     Musculoskeletal  (+) Arthritis ,    Abdominal   Peds  Hematology negative hematology ROS (+)   Anesthesia Other Findings   Reproductive/Obstetrics                             Anesthesia Physical Anesthesia Plan  ASA: 2  Anesthesia Plan: General   Post-op Pain Management: Tylenol PO (pre-op)* and Ketamine IV*   Induction: Intravenous  PONV Risk Score and Plan: 1 and Dexamethasone, Ondansetron and Treatment may vary due to age or medical condition  Airway Management Planned: Oral ETT  Additional Equipment: None  Intra-op Plan:   Post-operative Plan: Extubation in OR  Informed Consent: I have reviewed the patients History and Physical, chart, labs and discussed the procedure including the risks, benefits and alternatives for the proposed anesthesia with the patient or authorized representative who has indicated his/her understanding and acceptance.     Dental advisory given  Plan Discussed with: CRNA  Anesthesia Plan Comments:        Anesthesia Quick Evaluation

## 2023-06-22 NOTE — Transfer of Care (Signed)
Immediate Anesthesia Transfer of Care Note  Patient: Harold Smith  Procedure(s) Performed: HARDWARE REMOVAL (Right: Knee) ARTHROSCOPY KNEE (Right: Knee)  Patient Location: PACU  Anesthesia Type:General  Level of Consciousness: awake, alert , and oriented  Airway & Oxygen Therapy: Patient Spontanous Breathing and Patient connected to nasal cannula oxygen  Post-op Assessment: Report given to RN, Post -op Vital signs reviewed and stable, Patient moving all extremities X 4, and Patient able to stick tongue midline  Post vital signs: Reviewed  Last Vitals:  Vitals Value Taken Time  BP 170/97 06/22/23 1533  Temp 98.5   Pulse 86 06/22/23 1535  Resp 17 06/22/23 1535  SpO2 94 % 06/22/23 1535  Vitals shown include unvalidated device data.  Last Pain:  Vitals:   06/22/23 0726  TempSrc:   PainSc: 7       Patients Stated Pain Goal: 0 (06/22/23 0726)  Complications: No notable events documented.

## 2023-06-23 ENCOUNTER — Encounter (HOSPITAL_COMMUNITY): Payer: Self-pay | Admitting: Orthopedic Surgery

## 2023-06-26 DIAGNOSIS — Z419 Encounter for procedure for purposes other than remedying health state, unspecified: Secondary | ICD-10-CM | POA: Diagnosis not present

## 2023-06-26 NOTE — Anesthesia Postprocedure Evaluation (Signed)
Anesthesia Post Note  Patient: Harold Smith  Procedure(s) Performed: HARDWARE REMOVAL (Right: Knee) ARTHROSCOPY KNEE (Right: Knee)     Patient location during evaluation: PACU Anesthesia Type: General Level of consciousness: awake and alert Pain management: pain level controlled Vital Signs Assessment: post-procedure vital signs reviewed and stable Respiratory status: spontaneous breathing, nonlabored ventilation, respiratory function stable and patient connected to nasal cannula oxygen Cardiovascular status: blood pressure returned to baseline and stable Postop Assessment: no apparent nausea or vomiting Anesthetic complications: no   No notable events documented.  Last Vitals:  Vitals:   06/22/23 1550 06/22/23 1605  BP: (!) 166/99 (!) 161/100  Pulse: 78 75  Resp: 20 18  Temp:  36.9 C  SpO2: 93% 95%    Last Pain:  Vitals:   06/22/23 0726  TempSrc:   PainSc: 7                  Kennieth Rad

## 2023-06-27 ENCOUNTER — Encounter: Payer: 59 | Admitting: Physical Therapy

## 2023-06-30 ENCOUNTER — Encounter: Payer: Self-pay | Admitting: Orthopedic Surgery

## 2023-07-03 ENCOUNTER — Encounter: Payer: 59 | Admitting: Physical Therapy

## 2023-07-05 ENCOUNTER — Encounter: Payer: 59 | Admitting: Physical Therapy

## 2023-07-05 DIAGNOSIS — M67861 Other specified disorders of synovium, right knee: Secondary | ICD-10-CM | POA: Diagnosis not present

## 2023-07-05 DIAGNOSIS — T8484XD Pain due to internal orthopedic prosthetic devices, implants and grafts, subsequent encounter: Secondary | ICD-10-CM | POA: Diagnosis not present

## 2023-07-05 DIAGNOSIS — S72461D Displaced supracondylar fracture with intracondylar extension of lower end of right femur, subsequent encounter for closed fracture with routine healing: Secondary | ICD-10-CM | POA: Diagnosis not present

## 2023-07-05 DIAGNOSIS — M1711 Unilateral primary osteoarthritis, right knee: Secondary | ICD-10-CM | POA: Diagnosis not present
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
## 2023-07-06 ENCOUNTER — Other Ambulatory Visit: Payer: Self-pay | Admitting: Internal Medicine

## 2023-07-10 ENCOUNTER — Ambulatory Visit: Payer: 59 | Attending: Orthopedic Surgery | Admitting: Physical Therapy

## 2023-07-10 ENCOUNTER — Encounter: Payer: Self-pay | Admitting: Physical Therapy

## 2023-07-10 DIAGNOSIS — R262 Difficulty in walking, not elsewhere classified: Secondary | ICD-10-CM | POA: Insufficient documentation

## 2023-07-10 DIAGNOSIS — M25561 Pain in right knee: Secondary | ICD-10-CM | POA: Diagnosis not present

## 2023-07-10 DIAGNOSIS — M25661 Stiffness of right knee, not elsewhere classified: Secondary | ICD-10-CM | POA: Insufficient documentation

## 2023-07-10 DIAGNOSIS — M6281 Muscle weakness (generalized): Secondary | ICD-10-CM | POA: Diagnosis not present

## 2023-07-10 DIAGNOSIS — G8929 Other chronic pain: Secondary | ICD-10-CM | POA: Insufficient documentation

## 2023-07-11 ENCOUNTER — Encounter: Payer: 59 | Admitting: Physical Therapy

## 2023-07-12 ENCOUNTER — Encounter: Payer: Self-pay | Admitting: Internal Medicine

## 2023-07-12 ENCOUNTER — Ambulatory Visit: Payer: 59 | Admitting: Physical Therapy

## 2023-07-12 ENCOUNTER — Ambulatory Visit (INDEPENDENT_AMBULATORY_CARE_PROVIDER_SITE_OTHER): Payer: 59 | Admitting: Internal Medicine

## 2023-07-12 VITALS — BP 138/88 | HR 70 | Temp 97.5°F | Ht 70.0 in | Wt 182.0 lb

## 2023-07-12 DIAGNOSIS — Z0000 Encounter for general adult medical examination without abnormal findings: Secondary | ICD-10-CM | POA: Diagnosis not present

## 2023-07-12 DIAGNOSIS — Z23 Encounter for immunization: Secondary | ICD-10-CM

## 2023-07-12 DIAGNOSIS — F39 Unspecified mood [affective] disorder: Secondary | ICD-10-CM

## 2023-07-12 DIAGNOSIS — G894 Chronic pain syndrome: Secondary | ICD-10-CM

## 2023-07-12 DIAGNOSIS — I10 Essential (primary) hypertension: Secondary | ICD-10-CM | POA: Diagnosis not present

## 2023-07-12 DIAGNOSIS — K2210 Ulcer of esophagus without bleeding: Secondary | ICD-10-CM | POA: Diagnosis not present

## 2023-07-12 MED ORDER — CYCLOBENZAPRINE HCL 5 MG PO TABS: 5.0000 mg | ORAL_TABLET | Freq: Three times a day (TID) | ORAL | 2 refills | Status: DC | PRN

## 2023-07-12 MED ORDER — TRAZODONE HCL 50 MG PO TABS: 50.0000 mg | ORAL_TABLET | Freq: Every evening | ORAL | 1 refills | Status: DC | PRN

## 2023-07-12 MED ORDER — OXYCODONE-ACETAMINOPHEN 5-325 MG PO TABS: 1.0000 | ORAL_TABLET | Freq: Two times a day (BID) | ORAL | 0 refills | Status: DC | PRN

## 2023-07-12 NOTE — Assessment & Plan Note (Signed)
BP Readings from Last 3 Encounters:  07/12/23 138/88  06/22/23 (!) 161/100  06/15/23 (!) 155/91   Okay on lisinopril/HCTZ

## 2023-07-12 NOTE — Assessment & Plan Note (Signed)
Chronic pain and disability Is better with duloxetine 60mg 

## 2023-07-12 NOTE — Progress Notes (Signed)
Subjective:    Patient ID: Harold Smith, male    DOB: 05-16-1966, 57 y.o.   MRN: [id number]  HPI Here for physical  Still with ongoing back pain The back surgery did get rid of shooting pain down leg but still with daily pain Bad sitting and getting up, bending, etc Has tight feeling Also right knee--from fall in garage from his back  Oxycodone has helped the pain---but is out of it Gabapentin---uses up to 6 at night. Rarely will use during the day  Daughter is now living with him Helps him with housework and some ADLs--- like help with dressing She helps with mortgage--though he is on disability  Still with mood problems--will get "a little down" No suicidal ideation Likes to read--doesn't go out much Bible study on zoom  Current Outpatient Medications on File Prior to Visit  Medication Sig Dispense Refill   alendronate (FOSAMAX) 70 MG tablet Take 1 tablet (70 mg total) by mouth once a week. Take with a full glass of water on an empty stomach. 10 tablet 0   CALCIUM PO Take 1 tablet by mouth daily.     cetirizine (ZYRTEC) 10 MG tablet Take 10 mg by mouth daily.     Cholecalciferol 125 MCG (5000 UT) TABS Take 1 tablet by mouth daily. (Patient taking differently: Take 5,000 Units by mouth daily.) 30 tablet 6   Cyanocobalamin (VITAMIN B-12 PO) Take 1 tablet by mouth daily.     cyclobenzaprine (FLEXERIL) 5 MG tablet Take 1-2 tablets (5-10 mg total) by mouth 3 (three) times daily as needed for muscle spasms. 40 tablet 0   dicyclomine (BENTYL) 10 MG capsule Take 1 to 2 capsules as needed for abdominal cramps and diarrhea every 6 to 8 hours (Patient taking differently: Take 10-20 mg by mouth See admin instructions. Take 1 to 2 capsules as needed for abdominal cramps and diarrhea every 6 to 8 hours as needed) 240 capsule 3   DULoxetine (CYMBALTA) 60 MG capsule TAKE 1 CAPSULE BY MOUTH ONCE DAILY 90 capsule 3   ferrous sulfate (FEROSUL) 325 (65 FE) MG tablet TAKE ONE TABLET EACH  MORNING WITH BREAKFAST 30 tablet 0   fluticasone (FLONASE) 50 MCG/ACT nasal spray Place 2 sprays into both nostrils daily. (Patient taking differently: Place 2 sprays into both nostrils daily as needed for allergies or rhinitis.) 48 g 3   gabapentin (NEURONTIN) 300 MG capsule TAKE 1 CAPSULE BY MOUTH TWICE DAILY AND TAKE 4 CAPSULES AT BEDTIME 180 capsule 5   hydrochlorothiazide (HYDRODIURIL) 25 MG tablet Take 1 tablet (25 mg total) by mouth daily. 90 tablet 3   hydrocortisone (ANUSOL-HC) 25 MG suppository Place 1 suppository (25 mg total) rectally daily. (Patient taking differently: Place 25 mg rectally daily as needed for hemorrhoids.) 14 suppository 0   lipase/protease/amylase (CREON) 36000 UNITS CPEP capsule Take 2 capsules (72,000 Units total) by mouth 3 (three) times daily with meals. May also take 1 capsule (36,000 Units total) as needed (with snacks). (Patient taking differently: Take 36,000 units by mouth 3 (three) times daily with meals. ) 240 capsule 5   lisinopril (ZESTRIL) 20 MG tablet Take 1 tablet (20 mg total) by mouth daily. 90 tablet 3   meloxicam (MOBIC) 15 MG tablet Take 15 mg by mouth daily.     methocarbamol (ROBAXIN) 500 MG tablet Take 1 tablet (500 mg total) by mouth every 8 (eight) hours as needed (muscle spams, pain). 60 tablet 2   Multiple Vitamin (MULTIVITAMIN) tablet Take 1  tablet by mouth daily.     omeprazole (PRILOSEC) 40 MG capsule Take 1 capsule (40 mg total) by mouth 2 (two) times daily before a meal. 60 capsule 2   ondansetron (ZOFRAN-ODT) 4 MG disintegrating tablet Take 1 tablet (4 mg total) by mouth every 8 (eight) hours as needed. 20 tablet 0   oxyCODONE-acetaminophen (PERCOCET/ROXICET) 5-325 MG tablet Take 1-2 tablets by mouth every 6 (six) hours as needed for severe pain. 50 tablet 0   polyethylene glycol powder (GLYCOLAX/MIRALAX) 17 GM/SCOOP powder Take 1 capful (17 g) by mouth daily. (Patient taking differently: Take 17 g by mouth daily as needed for moderate  constipation.) 238 g 0   QUEtiapine (SEROQUEL) 25 MG tablet Take 1-2 tablets (25-50 mg total) by mouth at bedtime as needed (sleep). 60 tablet 3   sildenafil (REVATIO) 20 MG tablet Take 3-5 tablets as needed 1 hour prior to intercourse 60 tablet 6   Sodium Fluoride (PREVIDENT 5000 BOOSTER PLUS DT) Place 1 application  onto teeth 2 (two) times daily.     tadalafil (CIALIS) 20 MG tablet Take 1 tablet (20 mg total) by mouth daily as needed for erectile dysfunction. 10 tablet 11   traZODone (DESYREL) 50 MG tablet Take 50 mg by mouth at bedtime as needed for sleep.     Zinc Sulfate 220 (50 Zn) MG TABS Take 1 tablet (220 mg total) by mouth daily. 30 tablet 1   zolpidem (AMBIEN) 10 MG tablet Take 10 mg by mouth at bedtime as needed for sleep.     No current facility-administered medications on file prior to visit.    No Known Allergies  Past Medical History:  Diagnosis Date   Allergic rhinitis due to pollen    Anemia    Anxiety    Chronic pain    Colon polyps    Depression    Diverticulitis large intestine    GERD (gastroesophageal reflux disease)    HTN (hypertension)    Irritable bowel syndrome    Metabolic bone disease 09/01/2022   Obesity    Osteoarthritis, knee    Sleep apnea    does not use cpap, pt states he no longer needed CPAP due to weight loss   Sleep disturbance    Vitamin D deficiency 09/01/2022    Past Surgical History:  Procedure Laterality Date   ABDOMINAL EXPOSURE N/A 03/06/2023   Procedure: ABDOMINAL EXPOSURE;  Surgeon: Cephus Shelling, MD;  Location: [facility] OR;  Service: Vascular;  Laterality: N/A;   ACHILLES TENDON REPAIR Right 12/26/2006   ANTERIOR LUMBAR FUSION N/A 03/06/2023   Procedure: L5-S1 ANTERIOR LUMBAR FUSION 1 LEVEL;  Surgeon: London Sheer, MD;  Location: [facility] OR;  Service: Orthopedics;  Laterality: N/A;   CATARACT EXTRACTION W/PHACO Left 07/20/2021   Procedure: CATARACT EXTRACTION PHACO AND INTRAOCULAR LENS PLACEMENT (IOC) LEFT 2.11 00:24.0;   Surgeon: Galen Manila, MD;  Location: [facility];  Service: Ophthalmology;  Laterality: Left;  sleep apnea   CATARACT EXTRACTION W/PHACO Right 08/03/2021   Procedure: CATARACT EXTRACTION PHACO AND INTRAOCULAR LENS PLACEMENT (IOC) RIGHT;  Surgeon: Galen Manila, MD;  Location: [facility] SURGERY [facility]TR;  Service: Ophthalmology;  Laterality: Right;  3.19 0:33.0   CHOLECYSTECTOMY     COLON RESECTION Left 12/26/2004   due to diverticular disease at [facility]   COLONOSCOPY  2013?   COLONOSCOPY N/A 11/30/2022   Procedure: COLONOSCOPY;  Surgeon: Toney Reil, MD;  Location: [facility] ENDOSCOPY;  Service: Gastroenterology;  Laterality: N/A;   COLONOSCOPY WITH PROPOFOL N/A  05/22/2018   Procedure: COLONOSCOPY WITH PROPOFOL;  Surgeon: Toney Reil, MD;  Location: [facility] ENDOSCOPY;  Service: Gastroenterology;  Laterality: N/A;   COLONOSCOPY WITH PROPOFOL N/A 10/22/2020   Procedure: COLONOSCOPY WITH PROPOFOL;  Surgeon: Toney Reil, MD;  Location: [facility] SUR[facility]ERY [facility];  Service: Endoscopy;  Laterality: N/A;  priority 4   COLONOSCOPY WITH PROPOFOL N/A 11/10/2021   Procedure: COLONOSCOPY WITH PROPOFOL;  Surgeon: Toney Reil, MD;  Location: [facility] ENDOSCOPY;  Service: Gastroenterology;  Laterality: N/A;   COLONOSCOPY WITH PROPOFOL N/A 06/16/2022   Procedure: COLONOSCOPY WITH PROPOFOL;  Surgeon: Toney Reil, MD;  Location: [facility] SUR[facility]ERY [facility];  Service: Endoscopy;  Laterality: N/A;   COLONOSCOPY WITH PROPOFOL N/A 11/29/2022   Procedure: COLONOSCOPY WITH PROPOFOL;  Surgeon: Toney Reil, MD;  Location: [facility] ENDOSCOPY;  Service: Gastroenterology;  Laterality: N/A;   DRUG INDUCED ENDOSCOPY N/A 06/10/2019   Procedure: DRUG INDUCED SLEEP ENDOSCOPY;  Surgeon: Osborn Coho, MD;  Location:  SURGERY CENTER;  Service: ENT;  Laterality: N/A;   ESOPHAGOGASTRODUODENOSCOPY (EGD) WITH PROPOFOL N/A 06/16/2022   Procedure: ESOPHAGOGASTRODUODENOSCOPY (EGD) WITH PROPOFOL;   Surgeon: Toney Reil, MD;  Location: [facility] SURGERY [facility]TR;  Service: Endoscopy;  Laterality: N/A;   ESOPHAGOGASTRODUODENOSCOPY (EGD) WITH PROPOFOL N/A 05/24/2023   Procedure: ESOPHAGOGASTRODUODENOSCOPY (EGD) WITH PROPOFOL;  Surgeon: Toney Reil, MD;  Location: [facility] ENDOSCOPY;  Service: Gastroenterology;  Laterality: N/A;   GASTRIC BYPASS N/A 12/27/2007   HARDWARE REMOVAL Right 06/22/2023   Procedure: HARDWARE REMOVAL;  Surgeon: Myrene Galas, MD;  Location: [facility] OR;  Service: Orthopedics;  Laterality: Right;   HIATAL HERNIA REPAIR  09/25/2014   at [facility]   KNEE ARTHROSCOPY Right 06/22/2023   Procedure: ARTHROSCOPY KNEE;  Surgeon: Myrene Galas, MD;  Location: [facility] OR;  Service: Orthopedics;  Laterality: Right;   ORIF FEMUR FRACTURE Right 08/30/2022   Procedure: OPEN REDUCTION INTERNAL FIXATION (ORIF) DISTAL FEMUR FRACTURE;  Surgeon: Myrene Galas, MD;  Location: [facility] OR;  Service: Orthopedics;  Laterality: Right;   POLYPECTOMY  10/22/2020   Procedure: POLYPECTOMY;  Surgeon: Toney Reil, MD;  Location: [facility] SUR[facility]ERY [facility];  Service: Endoscopy;;   POLYPECTOMY  06/16/2022   Procedure: POLYPECTOMY;  Surgeon: Toney Reil, MD;  Location: [facility] SUR[facility]ERY [facility];  Service: Endoscopy;;   ROTATOR CUFF REPAIR Right    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    Family History  Problem Relation Age of Onset   Hypertension Mother    Diabetes Mother    Kidney disease Mother    Heart disease Mother    Colon cancer Father 60   Asthma Sister    Obesity Sister    Stroke Sister    Obesity Brother    Diabetes Other    Heart disease Other    Hypertension Other    Kidney disease Other    Cancer Paternal Uncle        unk type, possible prostate   Lung cancer Paternal Grandmother     Social History   Socioeconomic History   Marital status: Single    Spouse name: Not on file   Number of children: 3   Years of education: Not on file   Highest education level: Not on  file  Occupational History   Occupation: Hydrographic surveyor  Tobacco Use   Smoking status: Some Days    Types: Cigars    Passive exposure: Past   Smokeless tobacco: Never   Tobacco comments:    occasional cigar  Vaping Use  Vaping status: Never Used  Substance and Sexual Activity   Alcohol use: Not Currently   Drug use: No   Sexual activity: Yes  Other Topics Concern   Not on file  Social History Narrative   Not on file   Social Determinants of Health   Financial Resource Strain: Not on file  Food Insecurity: No Food Insecurity (03/06/2023)   Hunger Vital Sign    Worried About Running Out of Food in the Last Year: Never true    Ran Out of Food in the Last Year: Never true  Transportation Needs: No Transportation Needs (03/06/2023)   PRAPARE - Administrator, Civil Service (Medical): No    Lack of Transportation (Non-Medical): No  Physical Activity: Not on file  Stress: Not on file  Social Connections: Not on file  Intimate Partner Violence: Not At Risk (03/06/2023)   Humiliation, Afraid, Rape, and Kick questionnaire    Fear of Current or Ex-Partner: No    Emotionally Abused: No    Physically Abused: No    Sexually Abused: No   Review of Systems  Constitutional:  Negative for unexpected weight change.       Wears seat belt  HENT:  Positive for dental problem. Negative for hearing loss, tinnitus and trouble swallowing.        Getting dental work  Eyes:        Some blurry vision---early cataract  Respiratory:  Negative for cough, chest tightness and shortness of breath.   Cardiovascular:  Negative for chest pain, palpitations and leg swelling.  Gastrointestinal:  Negative for blood in stool and constipation.       Occ incontinence Heartburn controlled  Endocrine: Negative for polydipsia and polyuria.  Genitourinary:  Negative for urgency.       Some urge incontinence--wears protection ED--but not using viagra lately  Musculoskeletal:  Positive for  arthralgias and back pain. Negative for joint swelling.  Skin:  Negative for rash.  Allergic/Immunologic: Positive for environmental allergies. Negative for immunocompromised state.       Cetirizine and flonase  Neurological:  Positive for dizziness. Negative for headaches.       Did have syncope after first knee surgery---nothing recent  Hematological:  Negative for adenopathy. Does not bruise/bleed easily.  Psychiatric/Behavioral:  Positive for dysphoric mood and sleep disturbance. The patient is nervous/anxious.        Objective:   Physical Exam Constitutional:      Appearance: Normal appearance.  HENT:     Mouth/Throat:     Pharynx: No oropharyngeal exudate or posterior oropharyngeal erythema.  Cardiovascular:     Rate and Rhythm: Normal rate and regular rhythm.     Pulses: Normal pulses.     Heart sounds: No murmur heard.    No gallop.  Pulmonary:     Effort: Pulmonary effort is normal.     Breath sounds: Normal breath sounds. No wheezing or rales.  Abdominal:     Palpations: Abdomen is soft.     Tenderness: There is no abdominal tenderness.  Musculoskeletal:     Cervical back: Neck supple.     Right lower leg: No edema.     Left lower leg: No edema.     Comments: Very slow movements due to back pain  Lymphadenopathy:     Cervical: No cervical adenopathy.  Skin:    Findings: No lesion or rash.  Neurological:     Mental Status: He is alert and oriented to person, place, and time.  Psychiatric:        Mood and Affect: Mood normal.        Behavior: Behavior normal.            Assessment & Plan:

## 2023-07-12 NOTE — Assessment & Plan Note (Signed)
Hard to exercise Colon due again 3 years PSA last year--will repeat with next blood work COVID/flu update this fall Td today

## 2023-07-12 NOTE — Addendum Note (Signed)
Addended by: Eual Fines on: 07/12/2023 11:47 AM   Modules accepted: Orders

## 2023-07-12 NOTE — Assessment & Plan Note (Signed)
Quiet on the omeprazole 40mg  daily
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
## 2023-07-13 ENCOUNTER — Encounter: Payer: 59 | Admitting: Physical Therapy

## 2023-07-13 NOTE — Therapy (Unsigned)
OUTPATIENT PHYSICAL THERAPY TREATMENT NOTE   Patient Name: Harold Smith MRN: [id number] DOB:1966/03/14, 57 y.o., male Today's Date: 07/17/2023  END OF SESSION:  PT End of Session - 07/17/23 0955     Visit Number 2    Number of Visits 13    Date for PT Re-Evaluation 10/02/23    Authorization Type [employer] 2024 reporting period from 07/10/2023    Authorization Time Period VL: 30 combined PT/OT with [employer]/based on auth from [employer]    Authorization - Visit Number 6    Authorization - Number of Visits 30    Progress Note Due on Visit 10    PT Start Time [id number]   patient arrived late   PT Stop Time 1025    PT Time Calculation (min) 30 min    Activity Tolerance Patient limited by pain    Behavior During Therapy [facility] for tasks assessed/performed              Past Medical History:  Diagnosis Date   Allergic rhinitis due to pollen    Anemia    Anxiety    Chronic pain    Colon polyps    Depression    Diverticulitis large intestine    GERD (gastroesophageal reflux disease)    HTN (hypertension)    Irritable bowel syndrome    Metabolic bone disease 09/01/2022   Obesity    Osteoarthritis, knee    Sleep apnea    does not use cpap, pt states he no longer needed CPAP due to weight loss   Sleep disturbance    Vitamin D deficiency 09/01/2022   Past Surgical History:  Procedure Laterality Date   ABDOMINAL EXPOSURE N/A 03/06/2023   Procedure: ABDOMINAL EXPOSURE;  Surgeon: Cephus Shelling, MD;  Location: [facility] OR;  Service: Vascular;  Laterality: N/A;   ACHILLES TENDON REPAIR Right 12/26/2006   ANTERIOR LUMBAR FUSION N/A 03/06/2023   Procedure: L5-S1 ANTERIOR LUMBAR FUSION 1 LEVEL;  Surgeon: London Sheer, MD;  Location: [facility] OR;  Service: Orthopedics;  Laterality: N/A;   CATARACT EXTRACTION W/PHACO Left 07/20/2021   Procedure: CATARACT EXTRACTION PHACO AND INTRAOCULAR LENS PLACEMENT (IOC) LEFT 2.11 00:24.0;  Surgeon: Galen Manila, MD;  Location: [facility]  SURGERY CNTR;  Service: Ophthalmology;  Laterality: Left;  sleep apnea   CATARACT EXTRACTION W/PHACO Right 08/03/2021   Procedure: CATARACT EXTRACTION PHACO AND INTRAOCULAR LENS PLACEMENT (IOC) RIGHT;  Surgeon: Galen Manila, MD;  Location: [facility] SUR[facility];  Service: Ophthalmology;  Laterality: Right;  3.19 0:33.0   CHOLECYSTECTOMY     COLON RESECTION Left 12/26/2004   due to diverticular disease at [facility]   COLONOSCOPY  2013?   COLONOSCOPY N/A 11/30/2022   Procedure: COLONOSCOPY;  Surgeon: Toney Reil, MD;  Location: [facility] ENDOSCOPY;  Service: Gastroenterology;  Laterality: N/A;   COLONOSCOPY WITH PROPOFOL N/A 05/22/2018   Procedure: COLONOSCOPY WITH PROPOFOL;  Surgeon: Toney Reil, MD;  Location[facility] ENDOSCOPY;  Service: Gastroenterology;  Laterality: N/A;   COLONOSCOPY WITH PROPOFOL N/A 10/22/2020   Procedure: COLONOSCOPY WITH PROPOFOL;  Surgeon: Toney Reil, MD;  Location: [facility] SURGERY [facility];  Service: Endoscopy;  Laterality: N/A;  priority 4   COLONOSCOPY WITH PROPOFOL N/A 11/10/2021   Procedure: COLONOSCOPY WITH PROPOFOL;  Surgeon: Toney Reil, MD;  Location: [facility] ENDOSCOPY;  Service: Gastroenterology;  Laterality: N/A;   COLONOSCOPY WITH PROPOFOL N/A 06/16/2022   Procedure: COLONOSCOPY WITH PROPOFOL;  Surgeon: Toney Reil, MD;  Location: [facility]ERY [facility]TR;  Service: Endoscopy;  Laterality: N/A;  COLONOSCOPY WITH PROPOFOL N/A 11/29/2022   Procedure: COLONOSCOPY WITH PROPOFOL;  Surgeon: Toney Reil, MD;  Location: [facility] ENDOSCOPY;  Service: Gastroenterology;  Laterality: N/A;   DRUG INDUCED ENDOSCOPY N/A 06/10/2019   Procedure: DRUG INDUCED SLEEP ENDOSCOPY;  Surgeon: Osborn Coho, MD;  Location: [facility];  Service: ENT;  Laterality: N/A;   ESOPHAGOGASTRODUODENOSCOPY (EGD) WITH PROPOFOL N/A 06/16/2022   Procedure: ESOPHAGOGASTRODUODENOSCOPY (EGD) WITH PROPOFOL;  Surgeon: Toney Reil, MD;  Location: [facility]  SURGERY CNTR;  Service: Endoscopy;  Laterality: N/A;   ESOPHAGOGASTRODUODENOSCOPY (EGD) WITH PROPOFOL N/A 05/24/2023   Procedure: ESOPHAGOGASTRODUODENOSCOPY (EGD) WITH PROPOFOL;  Surgeon: Toney Reil, MD;  Location: [facility] ENDOSCOPY;  Service: Gastroenterology;  Laterality: N/A;   GASTRIC BYPASS N/A 12/27/2007   HARDWARE REMOVAL Right 06/22/2023   Procedure: HARDWARE REMOVAL;  Surgeon: Myrene Galas, MD;  Location[facility] OR;  Service: Orthopedics;  Laterality: Right;   HIATAL HERNIA REPAIR  09/25/2014   at [facility]   KNEE ARTHROSCOPY Right 06/22/2023   Procedure: ARTHROSCOPY KNEE;  Surgeon: Myrene Galas, MD;  Location: [facility] OR;  Service: Orthopedics;  Laterality: Right;   ORIF FEMUR FRACTURE Right 08/30/2022   Procedure: OPEN REDUCTION INTERNAL FIXATION (ORIF) DISTAL FEMUR FRACTURE;  Surgeon: Myrene Galas, MD;  Location: [facility] OR;  Service: Orthopedics;  Laterality: Right;   POLYPECTOMY  10/22/2020   Procedure: POLYPECTOMY;  Surgeon: Toney Reil, MD;  Location: [facility];  Service: Endoscopy;;   POLYPECTOMY  06/16/2022   Procedure: POLYPECTOMY;  Surgeon: Toney Reil, MD;  Location: [facility];  Service: Endoscopy;;   ROTATOR CUFF REPAIR Right    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   Patient Active Problem List   Diagnosis Date Noted   Chronic pain syndrome 07/12/2023   Anxiety 09/01/2022   Vitamin D deficiency 09/01/2022   Metabolic bone disease  09/01/2022   Displaced supracondylar fracture of distal end of right femur without intracondylar extension (HCC) 08/29/2022   Mood disorder (HCC) 07/08/2022   Erosive esophagitis    Disability of walking 06/01/2022   Degeneration of lumbar intervertebral disc 06/01/2022   Iron deficiency anemia 05/10/2022   Lumbar spondylolysis 02/25/2022   Sleep disturbance 07/02/2021   Preventative health care 06/23/2018   Personal hx of gastric bypass 06/23/2018   Erectile dysfunction 06/23/2018   Hx of  adenomatous colonic polyps    Essential hypertension 10/17/2013   Diverticulosis large intestine w/o perforation or abscess w/bleeding    Colon polyps     PCP:  Karie Schwalbe, MD  REFERRING PROVIDER: Doralee Albino. Carola Frost, MD  REFERRING DIAG: right knee arthritis post op removal of hardware plus scope  THERAPY DIAG:  Chronic pain of right knee  Stiffness of right knee, not elsewhere classified  Muscle weakness (generalized)  Difficulty in walking, not elsewhere classified  Rationale for Evaluation and Treatment: Rehabilitation  ONSET DATE: Original knee injury 08/2022. ORIF 08/28/2022. Hardware removal 06/22/2023.   PERTINENT HISTORY: Patient is a 57 y.o. male who presents to outpatient physical therapy with a referral for medical diagnosis right knee arthritis post op removal of hardware plus scope. This patient's chief complaints consist of right knee pain, instability, stiffness and weakness leading to the following functional deficits: difficulty with completing weight bearing activities without falling, bathing, walking, stairs, transfers, sleeping, dressing, driving. Relevant past medical history and comorbidities include s/p hardware removal and arthroscopy of R knee on 06/22/2023 (hardware originally for distal medial condyle fracture);  s/p L5-S1 ANTERIOR LUMBAR FUSION on  03/06/2023; Diverticulosis large intestine w/o perforation or abscess w/bleeding; Essential hypertension; Rectal bleeding; Hx of adenomatous colonic polyps; Personal hx of gastric bypass; Erectile dysfunction; Sleep disturbance; Lumbar spondylolysis; Microcytic anemia; Iron deficiency anemia; Numbness and tingling; Imbalance; Difficulty sleeping; Disability of walking; Pain in joint involving ankle and foot; Degeneration of lumbar intervertebral disc; Erosive esophagitis; Mood disorder (HCC); Displaced supracondylar fracture of distal end of right femur without intracondylar extension (HCC); Anxiety; Vitamin D  deficiency; Metabolic bone disease; and Radiculopathy, lumbar region. Please see above for relevant surgery history; osteoporosis, bowel/urinary incontinence, stumbling and dropping things. Patient denies hx of cancer, stroke, seizures, lung problems, heart problems, diabetes, and unexplained weight loss.  SUBJECTIVE:   SUBJECTIVE STATEMENT: Patient arrives late and states he had some trouble getting here on time. He states he missed his last PT session because he felt too bad due to GI trouble. He arrives today with his [facility], reporting he is doing as well as can be expected. He states he was prescribed an antihypertensive while he was in the hospital because he was experiencing high blood pressure in the hospital. He states he stopped his antihypertensive about a month ago because he has no history of hypertension. He states he has been really stressed recently. He states he had a physical a couple of weeks ago and and his blood pressure was good. He states he thinks it was 171/81 mmHg.   PAIN:  NPRS: 6/10 in his right knee and low back.   PRECAUTIONS: fall, back precautions since lumbar surgery (no lifting over 5#, repetitive lumbar bending/stooping, any repetitive lumbar motions)  PATIENT GOALS: "hopefully to make some improvement, especially with the pain"  NEXT MD VISIT: 08/09/2023  OBJECTIVE  Vitals:   07/17/23 1000 07/17/23 1006  BP: (!) 154/110 (!) 160/97  Pulse: (!) 106 (!) 104  SpO2: 100% 99%  Quiet sitting between readings  MUSCLE PERFORMANCE (MMT):  *Indicates pain 07/17/23 Date Date  Joint/Motion R/L R/L R/L  Knee     Extension (L3) 4*/4+ / /  Flexion (S2) 4*/4+ / /  Comments:  07/10/2023: effective R quad activation during quad set available.   FUNCTIONAL/BALANCE TESTS: 6 Minute Walk Test: 772 feet with SPC with pain up to 8/10 in right leg and feeling heavy   TODAY'S TREATMENT:   Therapeutic exercise: to centralize symptoms and improve ROM, strength, muscular  endurance, and activity tolerance required for successful completion of functional activities.  - vitals check (see above).  - education on hypertension and BEFAST stroke signs - knee flexion/extension MMT - hooklying SAQ with 2#AW on R and 5#AW on left, 3x10 each side.  - Education on HEP including handout  - Education on diagnosis, prognosis, POC, anatomy and physiology of current condition.   Pt required multimodal cuing for proper technique and to facilitate improved neuromuscular control, strength, range of motion, and functional ability resulting in improved performance and form.  PATIENT EDUCATION:  Education details: Exercise purpose/form. Self management techniques. Education on diagnosis, prognosis, POC, anatomy and physiology of current condition Education on HEP including handout  Person educated: Patient Education method: Explanation, Demonstration, Tactile cues, Verbal cues, and Handouts Education comprehension: verbalized understanding, returned demonstration, and needs further education  HOME EXERCISE PROGRAM: Access Code: [email or web address] URL: [email or web address] Date: 07/10/2023 Prepared by: Norton Blizzard  Exercises - Supine Quad Set  - 3 x daily - 1 sets - 20 reps - 5 seconds hold - Supine Heel Slide  - 3 x daily - 1 sets - 20 reps -  5 seconds hold - 4 Way Patellar Glide  - 3 x daily - 1 sets - 10 reps - 2 seconds hold  ASSESSMENT:  CLINICAL IMPRESSION: Patient arrives reporting pain in the low back and R knee. Very high diastolic BP reading initially. Down to a level appropriate for exercise by 2nd reading after 5 min quiet rest. Plan to monitor BP at next session and contact PCP if BP remains elevated. Educated patient on risks of hypertension and BEFAST acronym for stroke. Patient continues to have significant difficulty with mobility and quality of life due to R knee pain, stiffness, and weakness, and low back pain. All exercises increased his pain today.   Patient would benefit from continued management of limiting condition by skilled physical therapist to address remaining impairments and functional limitations to work towards stated goals and return to PLOF or maximal functional independence.   From initial PT Eval 07/10/2023:  Patient is a 57 y.o. male referred to outpatient physical therapy with a medical diagnosis of right knee arthritis post op removal of hardware plus scope who presents with signs and symptoms consistent with right knee pain, stiffness, and weakness s/p hardware removal and arthroscopy on 06/22/2023. Patient presents with significant pain, posture, ROM, joint stiffness, tissue tension, tissue integrity, motor control, muscle performance (strength/power/endurance), gait, balance, and activity tolerance impairments that are limiting ability to complete usual activities including completing weight bearing activities without falling, bathing, walking, stairs, transfers, sleeping, dressing, driving without difficulty. Patient will benefit from skilled physical therapy intervention to address current body structure impairments and activity limitations to improve function and work towards goals set in current POC in order to return to prior level of function or maximal functional improvement.   OBJECTIVE IMPAIRMENTS: Abnormal gait, decreased activity tolerance, decreased balance, decreased endurance, decreased knowledge of condition, decreased mobility, difficulty walking, decreased ROM, decreased strength, hypomobility, increased edema, increased fascial restrictions, impaired perceived functional ability, improper body mechanics, postural dysfunction, and pain.   ACTIVITY LIMITATIONS: carrying, lifting, bending, standing, squatting, sleeping, stairs, transfers, bed mobility, bathing, dressing, hygiene/grooming, and locomotion level  PARTICIPATION LIMITATIONS: interpersonal relationship, driving, shopping, community activity, occupation, and  yard work  PERSONAL FACTORS: Fitness, Past/current experiences, Time since onset of injury/illness/exacerbation, and 3+ comorbidities:   s/p hardware removal and arthroscopy of R knee on 06/22/2023 (hardware originally for distal medial condyle fracture);  s/p L5-S1 ANTERIOR LUMBAR FUSION on 03/06/2023; Diverticulosis large intestine w/o perforation or abscess w/bleeding; Essential hypertension; Rectal bleeding; Hx of adenomatous colonic polyps; Personal hx of gastric bypass; Erectile dysfunction; Sleep disturbance; Lumbar spondylolysis; Microcytic anemia; Iron deficiency anemia; Numbness and tingling; Imbalance; Difficulty sleeping; Disability of walking; Pain in joint involving ankle and foot; Degeneration of lumbar intervertebral disc; Erosive esophagitis; Mood disorder (HCC); Displaced supracondylar fracture of distal end of right femur without intracondylar extension (HCC); Anxiety; Vitamin D deficiency; Metabolic bone disease; and Radiculopathy, lumbar region. Please see above for relevant surgery history; osteoporosis, bowel/urinary incontinence, stumbling and dropping things are also affecting patient's functional outcome.   REHAB POTENTIAL: Good  CLINICAL DECISION MAKING: Evolving/moderate complexity  EVALUATION COMPLEXITY: Moderate   GOALS: Goals reviewed with patient? No  SHORT TERM GOALS: Target date: 07/24/2023  Patient will be independent with initial home exercise program for self-management of symptoms. Baseline: Initial HEP provided at [facility] (07/10/23); Goal status: In-progress   LONG TERM GOALS: Target date: 10/02/2023  Patient will be independent with a long-term home exercise program for self-management of symptoms.  Baseline: Initial HEP provided at [facility] (07/10/23); Goal  status: In-progress  2.  Patient will demonstrate improved FOTO to equal or greater than 30 by visit #10 to demonstrate improvement in overall condition and self-reported functional ability.  Baseline: 15  (07/10/23); Goal status: In-progress  3.  Patient will complete 5 Time Sit to Stand Test in equal or less than 15 seconds from 18.5 inch or less surface without UE support to demonstrate improved LE strength for household and community mobility.  Baseline: 49 seconds from 18.5 inch plinth with heavy B UE support and compensation with lack of R knee flexion and weight bearing mostly on L LE.  (07/10/23); Goal status: In-progress  4.  Patient will ambulate equal or greater than 1000 feet with LRAD on 6 Minute Walk Test to improve his community mobility.  Baseline: to be tested visit 2 as appropriate (07/10/23); 772 feet with SPC with pain up to 8/10 in right leg and feeling heavy (07/17/2023);  Goal status: In-progress  5.  Patient will demonstrate R knee extension strength within 80% of L knee extension strength to improve his ability to ascend and descend stairs.   Baseline: L knee extension strength to be tested visit 2 as appropriate (07/10/23); Goal status: In-progress  6.  Patient will demonstrate R knee PROM equal or greater to 0-125 degrees to improve his ability to ascend and descend and walk community distances.  Baseline: 9-0-126 (07/10/2023);  Goal status: In-progress    PLAN:  PT FREQUENCY: 1-2x/week  PT DURATION: 12 weeks  PLANNED INTERVENTIONS: Therapeutic exercises, Therapeutic activity, Neuromuscular re-education, Balance training, Gait training, Patient/Family education, Self Care, Joint mobilization, Stair training, DME instructions, Aquatic Therapy, Dry Needling, Electrical stimulation, Spinal mobilization, Cryotherapy, Moist heat, Manual therapy, and Re-evaluation  PLAN FOR NEXT SESSION: Progressive LE/functional strengthening and ROM to especially focus on R knee extension ROM  and quad strength as tolerated. Education. Balance and proprioception exercises as needed. Manual therapy as needed.    Cira Rue, PT, DPT 07/17/2023, 10:26 AM  [facility] [address] [place], [place] [address] P: [phone] I F: [phone]

## 2023-07-17 ENCOUNTER — Encounter: Payer: 59 | Admitting: Physical Therapy

## 2023-07-17 ENCOUNTER — Ambulatory Visit: Payer: 59 | Admitting: Physical Therapy

## 2023-07-17 ENCOUNTER — Encounter: Payer: Self-pay | Admitting: Physical Therapy

## 2023-07-17 VITALS — BP 160/97 | HR 104

## 2023-07-17 DIAGNOSIS — R262 Difficulty in walking, not elsewhere classified: Secondary | ICD-10-CM

## 2023-07-17 DIAGNOSIS — G8929 Other chronic pain: Secondary | ICD-10-CM | POA: Diagnosis not present

## 2023-07-17 DIAGNOSIS — M6281 Muscle weakness (generalized): Secondary | ICD-10-CM | POA: Diagnosis not present

## 2023-07-17 DIAGNOSIS — M25561 Pain in right knee: Secondary | ICD-10-CM | POA: Diagnosis not present

## 2023-07-17 DIAGNOSIS — M25661 Stiffness of right knee, not elsewhere classified: Secondary | ICD-10-CM | POA: Diagnosis not present

## 2023-07-19 ENCOUNTER — Encounter: Payer: Self-pay | Admitting: Physical Therapy

## 2023-07-19 ENCOUNTER — Encounter: Payer: 59 | Admitting: Physical Therapy

## 2023-07-19 ENCOUNTER — Telehealth: Payer: Self-pay

## 2023-07-19 ENCOUNTER — Encounter: Payer: Self-pay | Admitting: Oncology

## 2023-07-19 ENCOUNTER — Ambulatory Visit: Payer: 59 | Admitting: Physical Therapy

## 2023-07-19 ENCOUNTER — Other Ambulatory Visit: Payer: Self-pay

## 2023-07-19 DIAGNOSIS — M6281 Muscle weakness (generalized): Secondary | ICD-10-CM | POA: Diagnosis not present

## 2023-07-19 DIAGNOSIS — K2210 Ulcer of esophagus without bleeding: Secondary | ICD-10-CM

## 2023-07-19 DIAGNOSIS — R262 Difficulty in walking, not elsewhere classified: Secondary | ICD-10-CM | POA: Diagnosis not present

## 2023-07-19 DIAGNOSIS — G8929 Other chronic pain: Secondary | ICD-10-CM | POA: Diagnosis not present

## 2023-07-19 DIAGNOSIS — M25661 Stiffness of right knee, not elsewhere classified: Secondary | ICD-10-CM

## 2023-07-19 DIAGNOSIS — M25561 Pain in right knee: Secondary | ICD-10-CM | POA: Diagnosis not present

## 2023-07-19 NOTE — Telephone Encounter (Signed)
Called and got patient scheduled for a EGD on 08/29/2023. Went over instructions, mailed and sent to [employer].

## 2023-07-19 NOTE — Telephone Encounter (Signed)
-----   Message from [facility] Elizabeth H sent at 06/02/2023 11:30 AM EDT ----- repeat EGD in 3 months

## 2023-07-19 NOTE — Therapy (Signed)
OUTPATIENT PHYSICAL THERAPY TREATMENT NOTE   Patient Name: Harold Smith MRN: [id number] DOB:10-Feb-1966, 57 y.o., male Today's Date: 07/19/2023  END OF SESSION:  PT End of Session - 07/19/23 1039     Visit Number 3    Number of Visits 13    Date for PT Re-Evaluation 10/02/23    Authorization Type [employer] 2024 reporting period from 07/10/2023    Authorization Time Period VL: 30 combined PT/OT with [employer]/based on auth from [employer]    Authorization - Visit Number 7    Authorization - Number of Visits 30    Progress Note Due on Visit 10    PT Start Time 1033    PT Stop Time 1111    PT Time Calculation (min) 38 min    Activity Tolerance Patient limited by pain    Behavior During Therapy [facility] for tasks assessed/performed               Past Medical History:  Diagnosis Date   Allergic rhinitis due to pollen    Anemia    Anxiety    Chronic pain    Colon polyps    Depression    Diverticulitis large intestine    GERD (gastroesophageal reflux disease)    HTN (hypertension)    Irritable bowel syndrome    Metabolic bone disease 09/01/2022   Obesity    Osteoarthritis, knee    Sleep apnea    does not use cpap, pt states he no longer needed CPAP due to weight loss   Sleep disturbance    Vitamin D deficiency 09/01/2022   Past Surgical History:  Procedure Laterality Date   ABDOMINAL EXPOSURE N/A 03/06/2023   Procedure: ABDOMINAL EXPOSURE;  Surgeon: Cephus Shelling, MD;  Location: [facility] OR;  Service: Vascular;  Laterality: N/A;   ACHILLES TENDON REPAIR Right 12/26/2006   ANTERIOR LUMBAR FUSION N/A 03/06/2023   Procedure: L5-S1 ANTERIOR LUMBAR FUSION 1 LEVEL;  Surgeon: London Sheer, MD;  Location: [facility] OR;  Service: Orthopedics;  Laterality: N/A;   CATARACT EXTRACTION W/PHACO Left 07/20/2021   Procedure: CATARACT EXTRACTION PHACO AND INTRAOCULAR LENS PLACEMENT (IOC) LEFT 2.11 00:24.0;  Surgeon: Galen Manila, MD;  Location: [facility];   Service: Ophthalmology;  Laterality: Left;  sleep apnea   CATARACT EXTRACTION W/PHACO Right 08/03/2021   Procedure: CATARACT EXTRACTION PHACO AND INTRAOCULAR LENS PLACEMENT (IOC) RIGHT;  Surgeon: Galen Manila, MD;  Location: [facility];  Service: Ophthalmology;  Laterality: Right;  3.19 0:33.0   CHOLECYSTECTOMY     COLON RESECTION Left 12/26/2004   due to diverticular disease at [facility]   COLONOSCOPY  2013?   COLONOSCOPY N/A 11/30/2022   Procedure: COLONOSCOPY;  Surgeon: Toney Reil, MD;  Location: [facility] ENDOSCOPY;  Service: Gastroenterology;  Laterality: N/A;   COLONOSCOPY WITH PROPOFOL N/A 05/22/2018   Procedure: COLONOSCOPY WITH PROPOFOL;  Surgeon: Toney Reil, MD;  Location: [facility] ENDOSCOPY;  Service: Gastroenterology;  Laterality: N/A;   COLONOSCOPY WITH PROPOFOL N/A 10/22/2020   Procedure: COLONOSCOPY WITH PROPOFOL;  Surgeon: Toney Reil, MD;  Location: [facility];  Service: Endoscopy;  Laterality: N/A;  priority 4   COLONOSCOPY WITH PROPOFOL N/A 11/10/2021   Procedure: COLONOSCOPY WITH PROPOFOL;  Surgeon: Toney Reil, MD;  Location: [facility] ENDOSCOPY;  Service: Gastroenterology;  Laterality: N/A;   COLONOSCOPY WITH PROPOFOL N/A 06/16/2022   Procedure: COLONOSCOPY WITH PROPOFOL;  Surgeon: Toney Reil, MD;  Location: [facility];  Service: Endoscopy;  Laterality: N/A;   COLONOSCOPY  WITH PROPOFOL N/A 11/29/2022   Procedure: COLONOSCOPY WITH PROPOFOL;  Surgeon: Toney Reil, MD;  Location: [facility] ENDOSCOPY;  Service: Gastroenterology;  Laterality: N/A;   DRUG INDUCED ENDOSCOPY N/A 06/10/2019   Procedure: DRUG INDUCED SLEEP ENDOSCOPY;  Surgeon: Osborn Coho, MD;  Location: [facility];  Service: ENT;  Laterality: N/A;   ESOPHAGOGASTRODUODENOSCOPY (EGD) WITH PROPOFOL N/A 06/16/2022   Procedure: ESOPHAGOGASTRODUODENOSCOPY (EGD) WITH PROPOFOL;  Surgeon: Toney Reil, MD;  Location: [facility];   Service: Endoscopy;  Laterality: N/A;   ESOPHAGOGASTRODUODENOSCOPY (EGD) WITH PROPOFOL N/A 05/24/2023   Procedure: ESOPHAGOGASTRODUODENOSCOPY (EGD) WITH PROPOFOL;  Surgeon: Toney Reil, MD;  Location: [facility] ENDOSCOPY;  Service: Gastroenterology;  Laterality: N/A;   GASTRIC BYPASS N/A 12/27/2007   HARDWARE REMOVAL Right 06/22/2023   Procedure: HARDWARE REMOVAL;  Surgeon: Myrene Galas, MD;  Location: [facility] OR;  Service: Orthopedics;  Laterality: Right;   HIATAL HERNIA REPAIR  09/25/2014   at [facility]   KNEE ARTHROSCOPY Right 06/22/2023   Procedure: ARTHROSCOPY KNEE;  Surgeon: Myrene Galas, MD;  Location: [facility] OR;  Service: Orthopedics;  Laterality: Right;   ORIF FEMUR FRACTURE Right 08/30/2022   Procedure: OPEN REDUCTION INTERNAL FIXATION (ORIF) DISTAL FEMUR FRACTURE;  Surgeon: Myrene Galas, MD;  Location: [facility] OR;  Service: Orthopedics;  Laterality: Right;   POLYPECTOMY  10/22/2020   Procedure: POLYPECTOMY;  Surgeon: Toney Reil, MD;  Location: [facility];  Service: Endoscopy;;   POLYPECTOMY  06/16/2022   Procedure: POLYPECTOMY;  Surgeon: Toney Reil, MD;  Location: [facility];  Service: Endoscopy;;   ROTATOR CUFF REPAIR Right    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   Patient Active Problem List   Diagnosis Date Noted   Chronic pain syndrome 07/12/2023   Anxiety 09/01/2022   Vitamin D deficiency 09/01/2022   Metabolic bone disease  09/01/2022   Displaced supracondylar fracture of distal end of right femur without intracondylar extension (HCC) 08/29/2022   Mood disorder (HCC) 07/08/2022   Erosive esophagitis    Disability of walking 06/01/2022   Degeneration of lumbar intervertebral disc 06/01/2022   Iron deficiency anemia 05/10/2022   Lumbar spondylolysis 02/25/2022   Sleep disturbance 07/02/2021   Preventative health care 06/23/2018   Personal hx of gastric bypass 06/23/2018   Erectile dysfunction 06/23/2018   Hx of adenomatous colonic  polyps    Essential hypertension 10/17/2013   Diverticulosis large intestine w/o perforation or abscess w/bleeding    Colon polyps     PCP:  Karie Schwalbe, MD  REFERRING PROVIDER: Doralee Albino. Carola Frost, MD  REFERRING DIAG: right knee arthritis post op removal of hardware plus scope  THERAPY DIAG:  Chronic pain of right knee  Stiffness of right knee, not elsewhere classified  Muscle weakness (generalized)  Difficulty in walking, not elsewhere classified  Rationale for Evaluation and Treatment: Rehabilitation  ONSET DATE: Original knee injury 08/2022. ORIF 08/28/2022. Hardware removal 06/22/2023.   PERTINENT HISTORY: Patient is a 57 y.o. male who presents to outpatient physical therapy with a referral for medical diagnosis right knee arthritis post op removal of hardware plus scope. This patient's chief complaints consist of right knee pain, instability, stiffness and weakness leading to the following functional deficits: difficulty with completing weight bearing activities without falling, bathing, walking, stairs, transfers, sleeping, dressing, driving. Relevant past medical history and comorbidities include s/p hardware removal and arthroscopy of R knee on 06/22/2023 (hardware originally for distal medial condyle fracture);  s/p L5-S1 ANTERIOR LUMBAR FUSION on 03/06/2023;  Diverticulosis large intestine w/o perforation or abscess w/bleeding; Essential hypertension; Rectal bleeding; Hx of adenomatous colonic polyps; Personal hx of gastric bypass; Erectile dysfunction; Sleep disturbance; Lumbar spondylolysis; Microcytic anemia; Iron deficiency anemia; Numbness and tingling; Imbalance; Difficulty sleeping; Disability of walking; Pain in joint involving ankle and foot; Degeneration of lumbar intervertebral disc; Erosive esophagitis; Mood disorder (HCC); Displaced supracondylar fracture of distal end of right femur without intracondylar extension (HCC); Anxiety; Vitamin D deficiency; Metabolic bone  disease; and Radiculopathy, lumbar region. Please see above for relevant surgery history; osteoporosis, bowel/urinary incontinence, stumbling and dropping things. Patient denies hx of cancer, stroke, seizures, lung problems, heart problems, diabetes, and unexplained weight loss.  SUBJECTIVE:   SUBJECTIVE STATEMENT: Patient states he is "struggling today" and woke up with a "crook" in the right side of his neck. He had to go to [facility] already today.  He states his right leg felt like he had to drag it after last PT session. He put ice on his right knee and his brace on after last PT session. His right knee feels unstable when he is walking. He wishes he brought his right knee. HEP is going okay: a little painful but trying to work through it.   PAIN:  NPRS: 7-8/10 in his right knee,  5/10 right neck, 8/10 low back.   PRECAUTIONS: fall, back precautions since lumbar surgery (no lifting over 5#, repetitive lumbar bending/stooping, any repetitive lumbar motions)  PATIENT GOALS: "hopefully to make some improvement, especially with the pain"  NEXT MD VISIT: 08/09/2023  OBJECTIVE  TODAY'S TREATMENT:   Therapeutic exercise: to centralize symptoms and improve ROM, strength, muscular endurance, and activity tolerance required for successful completion of functional activities.  - Recumbent Bike level 0, seat position 18. For improved lower extremity ROM, muscular endurance, and activity tolerance; and to induce the analgesic effect of aerobic exercise, stimulate joint nutrition, and prepare body structures and systems for following interventions. X 6  minutes. (Unable to spin fast enough to turn on bike).  - seated single leg press with R LE, 10#, 2 reps but too uncomfortable - TOTAL gym R single leg squat with limited extension ROM at level 5, 3x10 - TOTAL gym L single leg squat with limited extension ROM at level 14, 3x10 - seated long arc quad 3x10 each side with AROM on right and 10#AW on left.  -  hooklying SAQ with AROM on R and 10#AW on left, 3x10 each side.   Manual therapy: to reduce pain and tissue tension, improve range of motion, neuromodulation, in order to promote improved ability to complete functional activities. HOOKLYING - R patellar mobs grade III-IV, 30 seconds in all directions with half foam roll behind knee.  - R tibiofemoral extension mobilization, grade III-IV, 3x30 seconds of each   Pt required multimodal cuing for proper technique and to facilitate improved neuromuscular control, strength, range of motion, and functional ability resulting in improved performance and form.  PATIENT EDUCATION:  Education details: Exercise purpose/form. Self management techniques. Education on diagnosis, prognosis, POC, anatomy and physiology of current condition Education on HEP including handout  Person educated: Patient Education method: Explanation, Demonstration, Tactile cues, Verbal cues, and Handouts Education comprehension: verbalized understanding, returned demonstration, and needs further education  HOME EXERCISE PROGRAM: Access Code: [email or web address] URL: [email or web address] Date: 07/10/2023 Prepared by: Norton Blizzard  Exercises - Supine Quad Set  - 3 x daily - 1 sets - 20 reps - 5 seconds hold - Supine Heel Slide  - 3 x daily -  1 sets - 20 reps - 5 seconds hold - 4 Way Patellar Glide  - 3 x daily - 1 sets - 10 reps - 2 seconds hold  ASSESSMENT:  CLINICAL IMPRESSION: Patient arrives reporting high levels of pain in his right knee, low back, and neck. He continues to present with pain dominant experience of his condition. Today's session focused on gentle ROM and strengthening for the right knee and total body strengthening. Patient had difficulty tolerating exercises due to pain but was motivated to continue good effort. Patient would benefit from continued management of limiting condition by skilled physical therapist to address remaining impairments and  functional limitations to work towards stated goals and return to PLOF or maximal functional independence.   From initial PT Eval 07/10/2023:  Patient is a 57 y.o. male referred to outpatient physical therapy with a medical diagnosis of right knee arthritis post op removal of hardware plus scope who presents with signs and symptoms consistent with right knee pain, stiffness, and weakness s/p hardware removal and arthroscopy on 06/22/2023. Patient presents with significant pain, posture, ROM, joint stiffness, tissue tension, tissue integrity, motor control, muscle performance (strength/power/endurance), gait, balance, and activity tolerance impairments that are limiting ability to complete usual activities including completing weight bearing activities without falling, bathing, walking, stairs, transfers, sleeping, dressing, driving without difficulty. Patient will benefit from skilled physical therapy intervention to address current body structure impairments and activity limitations to improve function and work towards goals set in current POC in order to return to prior level of function or maximal functional improvement.   OBJECTIVE IMPAIRMENTS: Abnormal gait, decreased activity tolerance, decreased balance, decreased endurance, decreased knowledge of condition, decreased mobility, difficulty walking, decreased ROM, decreased strength, hypomobility, increased edema, increased fascial restrictions, impaired perceived functional ability, improper body mechanics, postural dysfunction, and pain.   ACTIVITY LIMITATIONS: carrying, lifting, bending, standing, squatting, sleeping, stairs, transfers, bed mobility, bathing, dressing, hygiene/grooming, and locomotion level  PARTICIPATION LIMITATIONS: interpersonal relationship, driving, shopping, community activity, occupation, and yard work  PERSONAL FACTORS: Fitness, Past/current experiences, Time since onset of injury/illness/exacerbation, and 3+ comorbidities:    s/p hardware removal and arthroscopy of R knee on 06/22/2023 (hardware originally for distal medial condyle fracture);  s/p L5-S1 ANTERIOR LUMBAR FUSION on 03/06/2023; Diverticulosis large intestine w/o perforation or abscess w/bleeding; Essential hypertension; Rectal bleeding; Hx of adenomatous colonic polyps; Personal hx of gastric bypass; Erectile dysfunction; Sleep disturbance; Lumbar spondylolysis; Microcytic anemia; Iron deficiency anemia; Numbness and tingling; Imbalance; Difficulty sleeping; Disability of walking; Pain in joint involving ankle and foot; Degeneration of lumbar intervertebral disc; Erosive esophagitis; Mood disorder (HCC); Displaced supracondylar fracture of distal end of right femur without intracondylar extension (HCC); Anxiety; Vitamin D deficiency; Metabolic bone disease; and Radiculopathy, lumbar region. Please see above for relevant surgery history; osteoporosis, bowel/urinary incontinence, stumbling and dropping things are also affecting patient's functional outcome.   REHAB POTENTIAL: Good  CLINICAL DECISION MAKING: Evolving/moderate complexity  EVALUATION COMPLEXITY: Moderate   GOALS: Goals reviewed with patient? No  SHORT TERM GOALS: Target date: 07/24/2023  Patient will be independent with initial home exercise program for self-management of symptoms. Baseline: Initial HEP provided at IE (07/10/23); Goal status: In-progress   LONG TERM GOALS: Target date: 10/02/2023  Patient will be independent with a long-term home exercise program for self-management of symptoms.  Baseline: Initial HEP provided at IE (07/10/23); Goal status: In-progress  2.  Patient will demonstrate improved FOTO to equal or greater than 30 by visit #10 to demonstrate improvement in overall condition  and self-reported functional ability.  Baseline: 15 (07/10/23); Goal status: In-progress  3.  Patient will complete 5 Time Sit to Stand Test in equal or less than 15 seconds from 18.5 inch or  less surface without UE support to demonstrate improved LE strength for household and community mobility.  Baseline: 49 seconds from 18.5 inch plinth with heavy B UE support and compensation with lack of R knee flexion and weight bearing mostly on L LE.  (07/10/23); Goal status: In-progress  4.  Patient will ambulate equal or greater than 1000 feet with LRAD on 6 Minute Walk Test to improve his community mobility.  Baseline: to be tested visit 2 as appropriate (07/10/23); 772 feet with SPC with pain up to 8/10 in right leg and feeling heavy (07/17/2023);  Goal status: In-progress  5.  Patient will demonstrate R knee extension strength within 80% of L knee extension strength to improve his ability to ascend and descend stairs.   Baseline: L knee extension strength to be tested visit 2 as appropriate (07/10/23); Goal status: In-progress  6.  Patient will demonstrate R knee PROM equal or greater to 0-125 degrees to improve his ability to ascend and descend and walk community distances.  Baseline: 9-0-126 (07/10/2023);  Goal status: In-progress    PLAN:  PT FREQUENCY: 1-2x/week  PT DURATION: 12 weeks  PLANNED INTERVENTIONS: Therapeutic exercises, Therapeutic activity, Neuromuscular re-education, Balance training, Gait training, Patient/Family education, Self Care, Joint mobilization, Stair training, DME instructions, Aquatic Therapy, Dry Needling, Electrical stimulation, Spinal mobilization, Cryotherapy, Moist heat, Manual therapy, and Re-evaluation  PLAN FOR NEXT SESSION: Progressive LE/functional strengthening and ROM to especially focus on R knee extension ROM  and quad strength as tolerated. Education. Balance and proprioception exercises as needed. Manual therapy as needed.    Cira Rue, PT, DPT 07/19/2023, 12:56 PM  [facility] [address] [place], [place] [address] P: [phone] I F: [phone]

## 2023-07-25 ENCOUNTER — Encounter: Payer: 59 | Admitting: Physical Therapy

## 2023-07-25 ENCOUNTER — Ambulatory Visit: Payer: 59 | Admitting: Physical Therapy

## 2023-07-25 ENCOUNTER — Encounter: Payer: Self-pay | Admitting: Physical Therapy

## 2023-07-25 DIAGNOSIS — R262 Difficulty in walking, not elsewhere classified: Secondary | ICD-10-CM | POA: Diagnosis not present

## 2023-07-25 DIAGNOSIS — M25661 Stiffness of right knee, not elsewhere classified: Secondary | ICD-10-CM

## 2023-07-25 DIAGNOSIS — G8929 Other chronic pain: Secondary | ICD-10-CM

## 2023-07-25 DIAGNOSIS — M6281 Muscle weakness (generalized): Secondary | ICD-10-CM | POA: Diagnosis not present

## 2023-07-25 DIAGNOSIS — M25561 Pain in right knee: Secondary | ICD-10-CM | POA: Diagnosis not present

## 2023-07-25 NOTE — Therapy (Signed)
OUTPATIENT PHYSICAL THERAPY TREATMENT NOTE   Patient Name: Harold Smith MRN: [id number] DOB:07/04/1966, 57 y.o., male Today's Date: 07/25/2023  END OF SESSION:  PT End of Session - 07/25/23 1603     Visit Number 4    Number of Visits 13    Date for PT Re-Evaluation 10/02/23    Authorization Type [employer] 2024 reporting period from 07/10/2023    Authorization Time Period VL: 30 combined PT/OT with [employer]/based on auth from [employer]    Authorization - Visit Number 8    Authorization - Number of Visits 30    Progress Note Due on Visit 10    PT Start Time 1603    PT Stop Time 1641    PT Time Calculation (min) 38 min    Activity Tolerance Patient limited by pain    Behavior During Therapy [facility] for tasks assessed/performed                Past Medical History:  Diagnosis Date   Allergic rhinitis due to pollen    Anemia    Anxiety    Chronic pain    Colon polyps    Depression    Diverticulitis large intestine    GERD (gastroesophageal reflux disease)    HTN (hypertension)    Irritable bowel syndrome    Metabolic bone disease 09/01/2022   Obesity    Osteoarthritis, knee    Sleep apnea    does not use cpap, pt states he no longer needed CPAP due to weight loss   Sleep disturbance    Vitamin D deficiency 09/01/2022   Past Surgical History:  Procedure Laterality Date   ABDOMINAL EXPOSURE N/A 03/06/2023   Procedure: ABDOMINAL EXPOSURE;  Surgeon: Cephus Shelling, MD;  Location: [facility] OR;  Service: Vascular;  Laterality: N/A;   ACHILLES TENDON REPAIR Right 12/26/2006   ANTERIOR LUMBAR FUSION N/A 03/06/2023   Procedure: L5-S1 ANTERIOR LUMBAR FUSION 1 LEVEL;  Surgeon: London Sheer, MD;  Location: [facility] OR;  Service: Orthopedics;  Laterality: N/A;   CATARACT EXTRACTION W/PHACO Left 07/20/2021   Procedure: CATARACT EXTRACTION PHACO AND INTRAOCULAR LENS PLACEMENT (IOC) LEFT 2.11 00:24.0;  Surgeon: Galen Manila, MD;  Location: [facility];   Service: Ophthalmology;  Laterality: Left;  sleep apnea   CATARACT EXTRACTION W/PHACO Right 08/03/2021   Procedure: CATARACT EXTRACTION PHACO AND INTRAOCULAR LENS PLACEMENT (IOC) RIGHT;  Surgeon: Galen Manila, MD;  Location: [facility];  Service: Ophthalmology;  Laterality: Right;  3.19 0:33.0   CHOLECYSTECTOMY     COLON RESECTION Left 12/26/2004   due to diverticular disease at [facility]   COLONOSCOPY  2013?   COLONOSCOPY N/A 11/30/2022   Procedure: COLONOSCOPY;  Surgeon: Toney Reil, MD;  Location: [facility] END[facility]COPY;  Service: Gastroenterology;  Laterality: N/A;   COLONOSCOPY WITH PROPOFOL N/A 05/22/2018   Procedure: COLONOSCOPY WITH PROPOFOL;  Surgeon: Toney Reil, MD;  Location: [facility] ENDOSCOPY;  Service: Gastroenterology;  Laterality: N/A;   COLONOSCOPY WITH PROPOFOL N/A 10/22/2020   Procedure: COLONOSCOPY WITH PROPOFOL;  Surgeon: Toney Reil, MD;  Location: [facility];  Service: Endoscopy;  Laterality: N/A;  priority 4   COLONOSCOPY WITH PROPOFOL N/A 11/10/2021   Procedure: COLONOSCOPY WITH PROPOFOL;  Surgeon: Toney Reil, MD;  Location: [facility] ENDOSCOPY;  Service: Gastroenterology;  Laterality: N/A;   COLONOSCOPY WITH PROPOFOL N/A 06/16/2022   Procedure: COLONOSCOPY WITH PROPOFOL;  Surgeon: Toney Reil, MD;  Location: [facility];  Service: Endoscopy;  Laterality: N/A;  COLONOSCOPY WITH PROPOFOL N/A 11/29/2022   Procedure: COLONOSCOPY WITH PROPOFOL;  Surgeon: Toney Reil, MD;  Location: [facility] ENDOSCOPY;  Service: Gastroenterology;  Laterality: N/A;   DRUG INDUCED ENDOSCOPY N/A 06/10/2019   Procedure: DRUG INDUCED SLEEP ENDOSCOPY;  Surgeon: Osborn Coho, MD;  Location: [facility];  Service: ENT;  Laterality: N/A;   ESOPHAGOGASTRODUODENOSCOPY (EGD) WITH PROPOFOL N/A 06/16/2022   Procedure: ESOPHAGOGASTRODUODENOSCOPY (EGD) WITH PROPOFOL;  Surgeon: Toney Reil, MD;  Location: [facility];   Service: Endoscopy;  Laterality: N/A;   ESOPHAGOGASTRODUODENOSCOPY (EGD) WITH PROPOFOL N/A 05/24/2023   Procedure: ESOPHAGOGASTRODUODENOSCOPY (EGD) WITH PROPOFOL;  Surgeon: Toney Reil, MD;  Location: [facility] ENDOSCOPY;  Service: Gastroenterology;  Laterality: N/A;   GASTRIC BYPASS N/A 12/27/2007   HARDWARE REMOVAL Right 06/22/2023   Procedure: HARDWARE REMOVAL;  Surgeon: Myrene Galas, MD;  Location: [facility] OR;  Service: Orthopedics;  Laterality: Right;   HIATAL HERNIA REPAIR  09/25/2014   at [facility]   KNEE ARTHROSCOPY Right 06/22/2023   Procedure: ARTHROSCOPY KNEE;  Surgeon: Myrene Galas, MD;  Location: [facility] OR;  Service: Orthopedics;  Laterality: Right;   ORIF FEMUR FRACTURE Right 08/30/2022   Procedure: OPEN REDUCTION INTERNAL FIXATION (ORIF) DISTAL FEMUR FRACTURE;  Surgeon: Myrene Galas, MD;  Location: [facility] OR;  Service: Orthopedics;  Laterality: Right;   POLYPECTOMY  10/22/2020   Procedure: POLYPECTOMY;  Surgeon: Toney Reil, MD;  Location: [facility];  Service: Endoscopy;;   POLYPECTOMY  06/16/2022   Procedure: POLYPECTOMY;  Surgeon: Toney Reil, MD;  Location: [facility];  Service: Endoscopy;;   ROTATOR CUFF REPAIR Right    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   Patient Active Problem List   Diagnosis Date Noted   Chronic pain syndrome 07/12/2023   Anxiety 09/01/2022   Vitamin D deficiency 09/01/2022   Metabolic bone disease  09/01/2022   Displaced supracondylar fracture of distal end of right femur without intracondylar extension (HCC) 08/29/2022   Mood disorder (HCC) 07/08/2022   Erosive esophagitis    Disability of walking 06/01/2022   Degeneration of lumbar intervertebral disc 06/01/2022   Iron deficiency anemia 05/10/2022   Lumbar spondylolysis 02/25/2022   Sleep disturbance 07/02/2021   Preventative health care 06/23/2018   Personal hx of gastric bypass 06/23/2018   Erectile dysfunction 06/23/2018   Hx of adenomatous colonic  polyps    Essential hypertension 10/17/2013   Diverticulosis large intestine w/o perforation or abscess w/bleeding    Colon polyps     PCP:  Karie Schwalbe, MD  REFERRING PROVIDER: Doralee Albino. Carola Frost, MD  REFERRING DIAG: right knee arthritis post op removal of hardware plus scope  THERAPY DIAG:  Chronic pain of right knee  Stiffness of right knee, not elsewhere classified  Muscle weakness (generalized)  Difficulty in walking, not elsewhere classified  Rationale for Evaluation and Treatment: Rehabilitation  ONSET DATE: Original knee injury 08/2022. ORIF 08/28/2022. Hardware removal 06/22/2023.   PERTINENT HISTORY: Patient is a 57 y.o. male who presents to outpatient physical therapy with a referral for medical diagnosis right knee arthritis post op removal of hardware plus scope. This patient's chief complaints consist of right knee pain, instability, stiffness and weakness leading to the following functional deficits: difficulty with completing weight bearing activities without falling, bathing, walking, stairs, transfers, sleeping, dressing, driving. Relevant past medical history and comorbidities include s/p hardware removal and arthroscopy of R knee on 06/22/2023 (hardware originally for distal medial condyle fracture);  s/p L5-S1 ANTERIOR LUMBAR FUSION on  03/06/2023; Diverticulosis large intestine w/o perforation or abscess w/bleeding; Essential hypertension; Rectal bleeding; Hx of adenomatous colonic polyps; Personal hx of gastric bypass; Erectile dysfunction; Sleep disturbance; Lumbar spondylolysis; Microcytic anemia; Iron deficiency anemia; Numbness and tingling; Imbalance; Difficulty sleeping; Disability of walking; Pain in joint involving ankle and foot; Degeneration of lumbar intervertebral disc; Erosive esophagitis; Mood disorder (HCC); Displaced supracondylar fracture of distal end of right femur without intracondylar extension (HCC); Anxiety; Vitamin D deficiency; Metabolic bone  disease; and Radiculopathy, lumbar region. Please see above for relevant surgery history; osteoporosis, bowel/urinary incontinence, stumbling and dropping things. Patient denies hx of cancer, stroke, seizures, lung problems, heart problems, diabetes, and unexplained weight loss.  SUBJECTIVE:   SUBJECTIVE STATEMENT: Patient arrives with [facility] and soft brace with elastic straps on the right knee. He states he was pretty sore in both quads after last PT session. He is hoping he did not hurt his right knee by going up to his "man cave" and doing some quad extension with one plate, which he thinks is 5-10#. It recovered okay from that but he had to take some meds that he didn't want to.  Today has been a good day for his back.   PAIN:  NPRS: 6/10 in his right knee and low back.   PRECAUTIONS: fall, back precautions since lumbar surgery (no lifting over 5#, repetitive lumbar bending/stooping, any repetitive lumbar motions)  PATIENT GOALS: "hopefully to make some improvement, especially with the pain"  NEXT MD VISIT: 08/09/2023  OBJECTIVE  TODAY'S TREATMENT:   Therapeutic exercise: to centralize symptoms and improve ROM, strength, muscular endurance, and activity tolerance required for successful completion of functional activities.  - Recumbent Bike level 0, seat position 18-16. For improved lower extremity ROM, muscular endurance, and activity tolerance; and to induce the analgesic effect of aerobic exercise, stimulate joint nutrition, and prepare body structures and systems for following interventions. X 6  minutes. (Eventually able to spin fast enough to turn on bike).  - seated quad extension at [facility] machine, bilateral LE 1x10 at 37.5#, single leg 2x10 each side at 15#.   Modality: for improved quad activation Seated RUSSIAN NMES estim to right quad, seated at knee extension machine, seat position 3, with knee at 60 degrees flexion and stabilized to not move, CC, 10/50 cycle, 50 bps, 50% duty  cycle, 2 second ramp, anti-fatigue off. Intensity up to 100 mA (machine max).  Skin WFL before and after application. Skin cleaned with soap and water prior to application of 3x5 inch oval electrodes over distal quad and proximal motor point. MVIC tested just prior to application and found to be between 65#, unable to reach 50% MVIC despite maxing out intensity of modality during NMES. Patient did not contribute volitional contraction during delivery of stimulation. 10 minutes. Patient tolerated well. Unattended/intermittent attendance by PT after set up and before removal.  Pt required multimodal cuing for proper technique and to facilitate improved neuromuscular control, strength, range of motion, and functional ability resulting in improved performance and form.  PATIENT EDUCATION:  Education details: Exercise purpose/form. Self management techniques. Education on diagnosis, prognosis, POC, anatomy and physiology of current condition Education on HEP including handout  Person educated: Patient Education method: Explanation, Demonstration, Tactile cues, Verbal cues, and Handouts Education comprehension: verbalized understanding, returned demonstration, and needs further education  HOME EXERCISE PROGRAM: Access Code: [email or web address] URL: [email or web address] Date: 07/10/2023 Prepared by: Norton Blizzard  Exercises - Supine Quad Set  - 3 x daily - 1 sets - 20 reps -  5 seconds hold - Supine Heel Slide  - 3 x daily - 1 sets - 20 reps - 5 seconds hold - 4 Way Patellar Glide  - 3 x daily - 1 sets - 10 reps - 2 seconds hold  ASSESSMENT:  CLINICAL IMPRESSION: Patient arrives reporting high levels of pain in his right knee and low back but appears motivated to improve his strength. Today's session introduced russian estim with the attempt to reach 50% MIVC in right quads. Unfortunately, patient was unable to reach this level of force with estim intensity maxed out due to machine limitations and  not patient tolerance. Patient was able to progress his knee extension load with difficulty due to pain that was acceptable to patient. Patient would benefit from continued management of limiting condition by skilled physical therapist to address remaining impairments and functional limitations to work towards stated goals and return to PLOF or maximal functional independence.   From initial PT Eval 07/10/2023:  Patient is a 57 y.o. male referred to outpatient physical therapy with a medical diagnosis of right knee arthritis post op removal of hardware plus scope who presents with signs and symptoms consistent with right knee pain, stiffness, and weakness s/p hardware removal and arthroscopy on 06/22/2023. Patient presents with significant pain, posture, ROM, joint stiffness, tissue tension, tissue integrity, motor control, muscle performance (strength/power/endurance), gait, balance, and activity tolerance impairments that are limiting ability to complete usual activities including completing weight bearing activities without falling, bathing, walking, stairs, transfers, sleeping, dressing, driving without difficulty. Patient will benefit from skilled physical therapy intervention to address current body structure impairments and activity limitations to improve function and work towards goals set in current POC in order to return to prior level of function or maximal functional improvement.   OBJECTIVE IMPAIRMENTS: Abnormal gait, decreased activity tolerance, decreased balance, decreased endurance, decreased knowledge of condition, decreased mobility, difficulty walking, decreased ROM, decreased strength, hypomobility, increased edema, increased fascial restrictions, impaired perceived functional ability, improper body mechanics, postural dysfunction, and pain.   ACTIVITY LIMITATIONS: carrying, lifting, bending, standing, squatting, sleeping, stairs, transfers, bed mobility, bathing, dressing,  hygiene/grooming, and locomotion level  PARTICIPATION LIMITATIONS: interpersonal relationship, driving, shopping, community activity, occupation, and yard work  PERSONAL FACTORS: Fitness, Past/current experiences, Time since onset of injury/illness/exacerbation, and 3+ comorbidities:   s/p hardware removal and arthroscopy of R knee on 06/22/2023 (hardware originally for distal medial condyle fracture);  s/p L5-S1 ANTERIOR LUMBAR FUSION on 03/06/2023; Diverticulosis large intestine w/o perforation or abscess w/bleeding; Essential hypertension; Rectal bleeding; Hx of adenomatous colonic polyps; Personal hx of gastric bypass; Erectile dysfunction; Sleep disturbance; Lumbar spondylolysis; Microcytic anemia; Iron deficiency anemia; Numbness and tingling; Imbalance; Difficulty sleeping; Disability of walking; Pain in joint involving ankle and foot; Degeneration of lumbar intervertebral disc; Erosive esophagitis; Mood disorder (HCC); Displaced supracondylar fracture of distal end of right femur without intracondylar extension (HCC); Anxiety; Vitamin D deficiency; Metabolic bone disease; and Radiculopathy, lumbar region. Please see above for relevant surgery history; osteoporosis, bowel/urinary incontinence, stumbling and dropping things are also affecting patient's functional outcome.   REHAB POTENTIAL: Good  CLINICAL DECISION MAKING: Evolving/moderate complexity  EVALUATION COMPLEXITY: Moderate   GOALS: Goals reviewed with patient? No  SHORT TERM GOALS: Target date: 07/24/2023  Patient will be independent with initial home exercise program for self-management of symptoms. Baseline: Initial HEP provided at IE (07/10/23); Goal status: In-progress   LONG TERM GOALS: Target date: 10/02/2023  Patient will be independent with a long-term home exercise program for self-management of symptoms.  Baseline: Initial HEP provided at IE (07/10/23); Goal status: In-progress  2.  Patient will demonstrate improved  FOTO to equal or greater than 30 by visit #10 to demonstrate improvement in overall condition and self-reported functional ability.  Baseline: 15 (07/10/23); Goal status: In-progress  3.  Patient will complete 5 Time Sit to Stand Test in equal or less than 15 seconds from 18.5 inch or less surface without UE support to demonstrate improved LE strength for household and community mobility.  Baseline: 49 seconds from 18.5 inch plinth with heavy B UE support and compensation with lack of R knee flexion and weight bearing mostly on L LE.  (07/10/23); Goal status: In-progress  4.  Patient will ambulate equal or greater than 1000 feet with LRAD on 6 Minute Walk Test to improve his community mobility.  Baseline: to be tested visit 2 as appropriate (07/10/23); 772 feet with SPC with pain up to 8/10 in right leg and feeling heavy (07/17/2023);  Goal status: In-progress  5.  Patient will demonstrate R knee extension strength within 80% of L knee extension strength to improve his ability to ascend and descend stairs.   Baseline: L knee extension strength to be tested visit 2 as appropriate (07/10/23); Goal status: In-progress  6.  Patient will demonstrate R knee PROM equal or greater to 0-125 degrees to improve his ability to ascend and descend and walk community distances.  Baseline: 9-0-126 (07/10/2023);  Goal status: In-progress    PLAN:  PT FREQUENCY: 1-2x/week  PT DURATION: 12 weeks  PLANNED INTERVENTIONS: Therapeutic exercises, Therapeutic activity, Neuromuscular re-education, Balance training, Gait training, Patient/Family education, Self Care, Joint mobilization, Stair training, DME instructions, Aquatic Therapy, Dry Needling, Electrical stimulation, Spinal mobilization, Cryotherapy, Moist heat, Manual therapy, and Re-evaluation  PLAN FOR NEXT SESSION: Progressive LE/functional strengthening and ROM to especially focus on R knee extension ROM  and quad strength as tolerated. Education.  Balance and proprioception exercises as needed. Manual therapy as needed.    Cira Rue, PT, DPT 07/25/2023, 4:48 PM  [facility] [address] [place], [place] [address] P: [phone] I F: [phone]

## 2023-07-27 DIAGNOSIS — Z419 Encounter for procedure for purposes other than remedying health state, unspecified: Secondary | ICD-10-CM | POA: Diagnosis not present

## 2023-08-01 ENCOUNTER — Encounter: Payer: Self-pay | Admitting: Physical Therapy

## 2023-08-01 ENCOUNTER — Ambulatory Visit: Payer: 59 | Attending: Orthopedic Surgery | Admitting: Physical Therapy

## 2023-08-01 DIAGNOSIS — G8929 Other chronic pain: Secondary | ICD-10-CM | POA: Insufficient documentation

## 2023-08-01 DIAGNOSIS — R262 Difficulty in walking, not elsewhere classified: Secondary | ICD-10-CM | POA: Insufficient documentation

## 2023-08-01 DIAGNOSIS — M6281 Muscle weakness (generalized): Secondary | ICD-10-CM | POA: Diagnosis not present

## 2023-08-01 DIAGNOSIS — M25561 Pain in right knee: Secondary | ICD-10-CM | POA: Insufficient documentation

## 2023-08-01 DIAGNOSIS — M25661 Stiffness of right knee, not elsewhere classified: Secondary | ICD-10-CM | POA: Diagnosis not present

## 2023-08-01 NOTE — Therapy (Signed)
OUTPATIENT PHYSICAL THERAPY TREATMENT NOTE   Patient Name: Harold Smith MRN: [id number] DOB:[birth year], 57 y.o., male Today's Date: 08/01/2023  END OF SESSION:  PT End of Session - 08/01/23 0956     Visit Number 5    Number of Visits 13    Date for PT Re-Evaluation 10/02/23    Authorization Type [employer] 2024 reporting period from 07/10/2023    Authorization Time Period VL: 30 combined PT/OT with [employer]/based on auth from [employer]    Authorization - Visit Number 7    Authorization - Number of Visits 30    Progress Note Due on Visit 10    PT Start Time 0951    PT Stop Time 1045    PT Time Calculation (min) 54 min    Activity Tolerance Patient limited by pain    Behavior During Therapy [facility] for tasks assessed/performed                 Past Medical History:  Diagnosis Date   Allergic rhinitis due to pollen    Anemia    Anxiety    Chronic pain    Colon polyps    Depression    Diverticulitis large intestine    GERD (gastroesophageal reflux disease)    HTN (hypertension)    Irritable bowel syndrome    Metabolic bone disease 09/01/2022   Obesity    Osteoarthritis, knee    Sleep apnea    does not use cpap, pt states he no longer needed CPAP due to weight loss   Sleep disturbance    Vitamin D deficiency 09/01/2022   Past Surgical History:  Procedure Laterality Date   ABDOMINAL EXPOSURE N/A 03/06/2023   Procedure: ABDOMINAL EXPOSURE;  Surgeon: Cephus Shelling, MD;  Location: [facility] OR;  Service: Vascular;  Laterality: N/A;   ACHILLES TENDON REPAIR Right 12/26/2006   ANTERIOR LUMBAR FUSION N/A 03/06/2023   Procedure: L5-S1 ANTERIOR LUMBAR FUSION 1 LEVEL;  Surgeon: London Sheer, MD;  Location: [facility] OR;  Service: Orthopedics;  Laterality: N/A;   CATARACT EXTRACTION W/PHACO Left 07/20/2021   Procedure: CATARACT EXTRACTION PHACO AND INTRAOCULAR LENS PLACEMENT (IOC) LEFT 2.11 00:24.0;  Surgeon: Galen Manila, MD;  Location: [facility];   Service: Ophthalmology;  Laterality: Left;  sleep apnea   CATARACT EXTRACTION W/PHACO Right 08/03/2021   Procedure: CATARACT EXTRACTION PHACO AND INTRAOCULAR LENS PLACEMENT (IOC) RIGHT;  Surgeon: Galen Manila, MD;  Location: [facility];  Service: Ophthalmology;  Laterality: Right;  3.19 0:33.0   CHOLECYSTECTOMY     COLON RESECTION Left 12/26/2004   due to diverticular disease at [facility]   COLONOSCOPY  2013?   COLONOSCOPY N/A 11/30/2022   Procedure: COLONOSCOPY;  Surgeon: Toney Reil, MD;  Location: [facility] ENDOSCOPY;  Service: Gastroenterology;  Laterality: N/A;   COLONOSCOPY WITH PROPOFOL N/A 05/22/2018   Procedure: COLONOSCOPY WITH PROPOFOL;  Surgeon: Toney Reil, MD;  Location: [facility] ENDOSCOPY;  Service: Gastroenterology;  Laterality: N/A;   COLONOSCOPY WITH PROPOFOL N/A 10/22/2020   Procedure: COLONOSCOPY WITH PROPOFOL;  Surgeon: Toney Reil, MD;  Location: [facility];  Service: Endoscopy;  Laterality: N/A;  priority 4   COLONOSCOPY WITH PROPOFOL N/A 11/10/2021   Procedure: COLONOSCOPY WITH PROPOFOL;  Surgeon: Toney Reil, MD;  Location: [facility] ENDOSCOPY;  Service: Gastroenterology;  Laterality: N/A;   COLONOSCOPY WITH PROPOFOL N/A 06/16/2022   Procedure: COLONOSCOPY WITH PROPOFOL;  Surgeon: Toney Reil, MD;  Location: [facility];  Service: Endoscopy;  Laterality: N/A;  COLONOSCOPY WITH PROPOFOL N/A 11/29/2022   Procedure: COLONOSCOPY WITH PROPOFOL;  Surgeon: Toney Reil, MD;  Location: [facility] ENDOSCOPY;  Service: Gastroenterology;  Laterality: N/A;   DRUG INDUCED ENDOSCOPY N/A 06/10/2019   Procedure: DRUG INDUCED SLEEP ENDOSCOPY;  Surgeon: Osborn Coho, MD;  Location: [facility];  Service: ENT;  Laterality: N/A;   ESOPHAGOGASTRODUODENOSCOPY (EGD) WITH PROPOFOL N/A 06/16/2022   Procedure: ESOPHAGOGASTRODUODENOSCOPY (EGD) WITH PROPOFOL;  Surgeon: Toney Reil, MD;  Location: [facility];   Service: Endoscopy;  Laterality: N/A;   ESOPHAGOGASTRODUODENOSCOPY (EGD) WITH PROPOFOL N/A 05/24/2023   Procedure: ESOPHAGOGASTRODUODENOSCOPY (EGD) WITH PROPOFOL;  Surgeon: Toney Reil, MD;  Location: [facility] ENDOSCOPY;  Service: Gastroenterology;  Laterality: N/A;   GASTRIC BYPASS N/A 12/27/2007   HARDWARE REMOVAL Right 06/22/2023   Procedure: HARDWARE REMOVAL;  Surgeon: Myrene Galas, MD;  Location: [facility] OR;  Service: Orthopedics;  Laterality: Right;   HIATAL HERNIA REPAIR  09/25/2014   at [facility]   KNEE ARTHROSCOPY Right 06/22/2023   Procedure: ARTHROSCOPY KNEE;  Surgeon: Myrene Galas, MD;  Location: [facility] OR;  Service: Orthopedics;  Laterality: Right;   ORIF FEMUR FRACTURE Right 08/30/2022   Procedure: OPEN REDUCTION INTERNAL FIXATION (ORIF) DISTAL FEMUR FRACTURE;  Surgeon: Myrene Galas, MD;  Location: [facility] OR;  Service: Orthopedics;  Laterality: Right;   POLYPECTOMY  10/22/2020   Procedure: POLYPECTOMY;  Surgeon: Toney Reil, MD;  Location: [facility];  Service: Endoscopy;;   POLYPECTOMY  06/16/2022   Procedure: POLYPECTOMY;  Surgeon: Toney Reil, MD;  Location: [facility];  Service: Endoscopy;;   ROTATOR CUFF REPAIR Right    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   Patient Active Problem List   Diagnosis Date Noted   Chronic pain syndrome 07/12/2023   Anxiety 09/01/2022   Vitamin D deficiency 09/01/2022   Metabolic bone disease  09/01/2022   Displaced supracondylar fracture of distal end of right femur without intracondylar extension (HCC) 08/29/2022   Mood disorder (HCC) 07/08/2022   Erosive esophagitis    Disability of walking 06/01/2022   Degeneration of lumbar intervertebral disc 06/01/2022   Iron deficiency anemia 05/10/2022   Lumbar spondylolysis 02/25/2022   Sleep disturbance 07/02/2021   Preventative health care 06/23/2018   Personal hx of gastric bypass 06/23/2018   Erectile dysfunction 06/23/2018   Hx of adenomatous colonic  polyps    Essential hypertension 10/17/2013   Diverticulosis large intestine w/o perforation or abscess w/bleeding    Colon polyps     PCP:  Karie Schwalbe, MD  REFERRING PROVIDER: Doralee Albino. Carola Frost, MD  REFERRING DIAG: right knee arthritis post op removal of hardware plus scope  THERAPY DIAG:  Chronic pain of right knee  Stiffness of right knee, not elsewhere classified  Muscle weakness (generalized)  Difficulty in walking, not elsewhere classified  Rationale for Evaluation and Treatment: Rehabilitation  ONSET DATE: Original knee injury 08/2022. ORIF 08/28/2022. Hardware removal 06/22/2023.   PERTINENT HISTORY: Patient is a 56 y.o. male who presents to outpatient physical therapy with a referral for medical diagnosis right knee arthritis post op removal of hardware plus scope. This patient's chief complaints consist of right knee pain, instability, stiffness and weakness leading to the following functional deficits: difficulty with completing weight bearing activities without falling, bathing, walking, stairs, transfers, sleeping, dressing, driving. Relevant past medical history and comorbidities include s/p hardware removal and arthroscopy of R knee on 06/22/2023 (hardware originally for distal medial condyle fracture);  s/p L5-S1 ANTERIOR LUMBAR FUSION on  03/06/2023; Diverticulosis large intestine w/o perforation or abscess w/bleeding; Essential hypertension; Rectal bleeding; Hx of adenomatous colonic polyps; Personal hx of gastric bypass; Erectile dysfunction; Sleep disturbance; Lumbar spondylolysis; Microcytic anemia; Iron deficiency anemia; Numbness and tingling; Imbalance; Difficulty sleeping; Disability of walking; Pain in joint involving ankle and foot; Degeneration of lumbar intervertebral disc; Erosive esophagitis; Mood disorder (HCC); Displaced supracondylar fracture of distal end of right femur without intracondylar extension (HCC); Anxiety; Vitamin D deficiency; Metabolic bone  disease; and Radiculopathy, lumbar region. Please see above for relevant surgery history; osteoporosis, bowel/urinary incontinence, stumbling and dropping things. Patient denies hx of cancer, stroke, seizures, lung problems, heart problems, diabetes, and unexplained weight loss.  SUBJECTIVE:   SUBJECTIVE STATEMENT: Patient arrives with SPC in R UE and states his back is "jacked up" but he has been working his knee hard to get more strength. He has been using his quad extension machine in his man cave. He has been doing some flexion and extension on his back. He has been going up and down the stairs. He felt sore in his thighs after last PT session. His knee was hurting but he iced it. It was a bit more pain than tolerable but he dealt with it. He wants to work hard today.   PAIN:  NPRS: 5/10 in his right knee and 7/10 low back.   PRECAUTIONS: fall, back precautions since lumbar surgery (no lifting over 5#, repetitive lumbar bending/stooping, any repetitive lumbar motions)  PATIENT GOALS: "hopefully to make some improvement, especially with the pain"  NEXT MD VISIT: 08/09/2023  OBJECTIVE  SELF-REPORTED FUNCTION FOTO score: 33/100 (knee questionnaire)   TODAY'S TREATMENT:   Therapeutic exercise: to centralize symptoms and improve ROM, strength, muscular endurance, and activity tolerance required for successful completion of functional activities.  - Recumbent Bike level 3 (requested by patient), seat position 16. For improved lower extremity ROM, muscular endurance, and activity tolerance; and to induce the analgesic effect of aerobic exercise, stimulate joint nutrition, and prepare body structures and systems for following interventions. X 5  minutes.  - seated quad extension at [facility] machine, seat position 3, bilateral LE 1x10 at 40#, single leg 3x10 each side at 15#. R side 4th set with more rest between to improve ROM.  - seated hamstring curl at [facility] machine, seat position 3, bilateral  LE 1x10 at 25#, single leg 3x10 each side at 25#. 3x10 each side - standing R TKE against blackTB with B UE support, 2x10 with 5 second hold.  - Education on HEP including handout    Modality: for improved quad activation Seated RUSSIAN NMES estim to right quad, seated at knee extension machine, seat position 3, with knee at 60 degrees flexion and stabilized to not move, CC, 10/50 cycle, 50 bps, 50% duty cycle, 2 second ramp, anti-fatigue off. Intensity up to 100 mA (machine max).  Skin WFL before and after application. Skin cleaned with soap and water prior to application of 3x5 inch rectangular electrodes over distal quad and proximal motor point. MVIC tested just prior to application and found to be approx 55#, unable to reach 50% MVIC despite maxing out intensity of modality during NMES. Patient did not contribute volitional contraction during delivery of stimulation. 15 minutes. Patient tolerated well. Unattended/intermittent attendance by PT after set up and before removal.  Pt required multimodal cuing for proper technique and to facilitate improved neuromuscular control, strength, range of motion, and functional ability resulting in improved performance and form.  PATIENT EDUCATION:  Education details: Exercise  purpose/form. Self management techniques. Education on diagnosis, prognosis, POC, anatomy and physiology of current condition Education on HEP including handout  Person educated: Patient Education method: Explanation, Demonstration, Tactile cues, Verbal cues, and Handouts Education comprehension: verbalized understanding, returned demonstration, and needs further education  HOME EXERCISE PROGRAM: Access Code: [email or web address] URL: [email or web address] Date: 08/01/2023 Prepared by: Norton Blizzard  Exercises - Supine Heel Slide  - 3 x daily - 1 sets - 20 reps - 5 seconds hold - 4 Way Patellar Glide  - 3 x daily - 1 sets - 10 reps - 2 seconds hold - Standing Terminal Knee  Extension with Resistance  - 1 x daily - 2 sets - 10 reps - 5 seconds hold - Supine Single Knee to Chest Stretch with Neck Flexion  - 1-2 x daily - 2 sets - 10 reps - 5 seconds hold  ASSESSMENT:  CLINICAL IMPRESSION: Patient arrives reporting continued R knee pain but continued partiicipation in quad strengthening and ROM activities. Today's session continued with exercises targeting quad and hamstring strength to pateint's tolerance. Updated HEP to more appropriate strengthening exercises. Patient's incision appears to be healing well and swelling going down. Patient experiences pain with exercises but appears motivated to participate with excellent effort. He continues to require use of SPC with report of R knee instability at times. Patient would benefit from continued management of limiting condition by skilled physical therapist to address remaining impairments and functional limitations to work towards stated goals and return to PLOF or maximal functional independence.     From initial PT Eval 07/10/2023:  Patient is a 57 y.o. male referred to outpatient physical therapy with a medical diagnosis of right knee arthritis post op removal of hardware plus scope who presents with signs and symptoms consistent with right knee pain, stiffness, and weakness s/p hardware removal and arthroscopy on 06/22/2023. Patient presents with significant pain, posture, ROM, joint stiffness, tissue tension, tissue integrity, motor control, muscle performance (strength/power/endurance), gait, balance, and activity tolerance impairments that are limiting ability to complete usual activities including completing weight bearing activities without falling, bathing, walking, stairs, transfers, sleeping, dressing, driving without difficulty. Patient will benefit from skilled physical therapy intervention to address current body structure impairments and activity limitations to improve function and work towards goals set in current  POC in order to return to prior level of function or maximal functional improvement.   OBJECTIVE IMPAIRMENTS: Abnormal gait, decreased activity tolerance, decreased balance, decreased endurance, decreased knowledge of condition, decreased mobility, difficulty walking, decreased ROM, decreased strength, hypomobility, increased edema, increased fascial restrictions, impaired perceived functional ability, improper body mechanics, postural dysfunction, and pain.   ACTIVITY LIMITATIONS: carrying, lifting, bending, standing, squatting, sleeping, stairs, transfers, bed mobility, bathing, dressing, hygiene/grooming, and locomotion level  PARTICIPATION LIMITATIONS: interpersonal relationship, driving, shopping, community activity, occupation, and yard work  PERSONAL FACTORS: Fitness, Past/current experiences, Time since onset of injury/illness/exacerbation, and 3+ comorbidities:   s/p hardware removal and arthroscopy of R knee on 06/22/2023 (hardware originally for distal medial condyle fracture);  s/p L5-S1 ANTERIOR LUMBAR FUSION on 03/06/2023; Diverticulosis large intestine w/o perforation or abscess w/bleeding; Essential hypertension; Rectal bleeding; Hx of adenomatous colonic polyps; Personal hx of gastric bypass; Erectile dysfunction; Sleep disturbance; Lumbar spondylolysis; Microcytic anemia; Iron deficiency anemia; Numbness and tingling; Imbalance; Difficulty sleeping; Disability of walking; Pain in joint involving ankle and foot; Degeneration of lumbar intervertebral disc; Erosive esophagitis; Mood disorder (HCC); Displaced supracondylar fracture of distal end of right femur without intracondylar extension (HCC); Anxiety; Vitamin D deficiency;  Metabolic bone disease; and Radiculopathy, lumbar region. Please see above for relevant surgery history; osteoporosis, bowel/urinary incontinence, stumbling and dropping things are also affecting patient's functional outcome.   REHAB POTENTIAL: Good  CLINICAL DECISION  MAKING: Evolving/moderate complexity  EVALUATION COMPLEXITY: Moderate   GOALS: Goals reviewed with patient? No  SHORT TERM GOALS: Target date: 07/24/2023  Patient will be independent with initial home exercise program for self-management of symptoms. Baseline: Initial HEP provided at IE (07/10/23); Goal status: In-progress   LONG TERM GOALS: Target date: 10/02/2023  Patient will be independent with a long-term home exercise program for self-management of symptoms.  Baseline: Initial HEP provided at IE (07/10/23); Goal status: In-progress  2.  Patient will demonstrate improved FOTO to equal or greater than 30 by visit #10 to demonstrate improvement in overall condition and self-reported functional ability.  Baseline: 15 (07/10/23); 33 at visit #5 (08/01/2023);  Goal status: In-progress  3.  Patient will complete 5 Time Sit to Stand Test in equal or less than 15 seconds from 18.5 inch or less surface without UE support to demonstrate improved LE strength for household and community mobility.  Baseline: 49 seconds from 18.5 inch plinth with heavy B UE support and compensation with lack of R knee flexion and weight bearing mostly on L LE.  (07/10/23); Goal status: In-progress  4.  Patient will ambulate equal or greater than 1000 feet with LRAD on 6 Minute Walk Test to improve his community mobility.  Baseline: to be tested visit 2 as appropriate (07/10/23); 772 feet with SPC with pain up to 8/10 in right leg and feeling heavy (07/17/2023);  Goal status: In-progress  5.  Patient will demonstrate R knee extension strength within 80% of L knee extension strength to improve his ability to ascend and descend stairs.   Baseline: L knee extension strength to be tested visit 2 as appropriate (07/10/23); Goal status: In-progress  6.  Patient will demonstrate R knee PROM equal or greater to 0-125 degrees to improve his ability to ascend and descend and walk community distances.  Baseline: 9-0-126  (07/10/2023);  Goal status: In-progress    PLAN:  PT FREQUENCY: 1-2x/week  PT DURATION: 12 weeks  PLANNED INTERVENTIONS: Therapeutic exercises, Therapeutic activity, Neuromuscular re-education, Balance training, Gait training, Patient/Family education, Self Care, Joint mobilization, Stair training, DME instructions, Aquatic Therapy, Dry Needling, Electrical stimulation, Spinal mobilization, Cryotherapy, Moist heat, Manual therapy, and Re-evaluation  PLAN FOR NEXT SESSION: Progressive LE/functional strengthening and ROM to especially focus on R knee extension ROM  and quad strength as tolerated. Education. Balance and proprioception exercises as needed. Manual therapy as needed.    Cira Rue, PT, DPT 08/01/2023, 11:02 PM  [facility] [address] [place], [place] [address] P: [phone] I F: [phone]

## 2023-08-03 ENCOUNTER — Ambulatory Visit: Payer: 59 | Admitting: Physical Therapy

## 2023-08-03 ENCOUNTER — Encounter: Payer: Self-pay | Admitting: Physical Therapy

## 2023-08-03 DIAGNOSIS — R262 Difficulty in walking, not elsewhere classified: Secondary | ICD-10-CM | POA: Diagnosis not present

## 2023-08-03 DIAGNOSIS — G8929 Other chronic pain: Secondary | ICD-10-CM

## 2023-08-03 DIAGNOSIS — M25661 Stiffness of right knee, not elsewhere classified: Secondary | ICD-10-CM

## 2023-08-03 DIAGNOSIS — M6281 Muscle weakness (generalized): Secondary | ICD-10-CM

## 2023-08-03 DIAGNOSIS — M25561 Pain in right knee: Secondary | ICD-10-CM | POA: Diagnosis not present
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
## 2023-08-06 ENCOUNTER — Other Ambulatory Visit: Payer: Self-pay | Admitting: Internal Medicine

## 2023-08-06 ENCOUNTER — Other Ambulatory Visit: Payer: Self-pay | Admitting: Gastroenterology

## 2023-08-07 ENCOUNTER — Other Ambulatory Visit: Payer: Self-pay | Admitting: Internal Medicine

## 2023-08-07 ENCOUNTER — Ambulatory Visit: Payer: 59 | Admitting: Physical Therapy

## 2023-08-07 ENCOUNTER — Encounter: Payer: Self-pay | Admitting: Physical Therapy

## 2023-08-07 DIAGNOSIS — M25561 Pain in right knee: Secondary | ICD-10-CM | POA: Diagnosis not present

## 2023-08-07 DIAGNOSIS — R262 Difficulty in walking, not elsewhere classified: Secondary | ICD-10-CM | POA: Diagnosis not present

## 2023-08-07 DIAGNOSIS — M6281 Muscle weakness (generalized): Secondary | ICD-10-CM | POA: Diagnosis not present

## 2023-08-07 DIAGNOSIS — M25661 Stiffness of right knee, not elsewhere classified: Secondary | ICD-10-CM

## 2023-08-07 DIAGNOSIS — G8929 Other chronic pain: Secondary | ICD-10-CM | POA: Diagnosis not present

## 2023-08-07 NOTE — Op Note (Signed)
08/29/2023  8:37 AM  PATIENT:  Harold Smith  1966-02-06 male   MEDICAL RECORD NUMBER: [id number]  PREOPERATIVE DIAGNOSES:   1.  Symptomatic hardware, right femur. 2.  Internal derangement, right knee.  POSTOPERATIVE DIAGNOSES: 1.  Symptomatic hardware. 2.  Patellafemoral chondrosis with grade 3 changes. 3.  Torn lateral meniscus. 4.  Extensive synovitis lateral and anterior compartments. 5.  Medial plica with matching medial femoral chondrosis.  PROCEDURES:   1.  Arthroscopic lateral partial meniscectomy. 2.  Arthroscopic synovectomy anterior and lateral compartments. 3.  Excision of medial plica. 4.  Removal of deep implant, right femur.  SURGEON:  Doralee Albino. Carola Frost, MD.  ASSISTANT:  None.  ANESTHESIA:  General.  COMPLICATIONS:  None.  TOURNIQUET:  None.  DISPOSITION:  To PACU.  CONDITION:  Stable.  BRIEF SUMMARY:  The patient is a very pleasant 57 y.o. who sustained injury to the knee requiring operative repair.  Progressive mechanical symptoms with locking,  catching and giving away have been noted in addition to pain and effusion.  Because of the adjacent hardware and symptoms attributable to that that had failed to resolve with conservative measures we deemed it most appropriate to proceed with removal of hardware and arthroscopic exploration versus getting an MRI, which would not be definitive because of the adjacent metal artifact.  Discussed with the risks and benefits of surgery including the possibility of failure to resolve all  symptoms, need for further surgery, DVT, PE, loss of motion, occult nonunion and others.  These risks were acknowledged and consent provided to proceed.  BRIEF SUMMARY OF PROCEDURE:  The patient received preoperative antibiotics, was taken to the operating room where general anesthesia was induced.  The operative lower extremity was prepped and draped in the usual sterile fashion using chlorhexidine wash and  Betadine scrub and paint.   After draping, timeout was held.  The knee was injected with Marcaine and epinephrine at the portal sites as well as some into the knee joint to help to control bleeding.  An 11 blade was used to create a standard portal sites.   A diagnostic arthroscopy was performed of the entire knee, identifying chondrosis of the patella with extensive grade 3 changes.  There was a lesion over the edge of the medial femoral condyle where a large medial plica was scraping directly over this with  flexion of the knee with signs of chondrosis on the femoral condyle at this area. The articular surface appeared to be healed and well reduced. The trochlea had some fissuring and chondrosis, as well. Evaluation of the medial compartment revealed an intact meniscus but large area of arthritis on the condyle. Chondral shaving was performed in the trochlea and medial compartments. The lateral compartment demonstrated a tear at the junction of the body and posterior horn, but no other horizontal or radial tears then just that one area.  There were no large loose bodies within the knee.  There was extensive synovitis laterally also  protruding into the joint and this was shaved back to stable area that could not entrap in the joint.  I then took the instruments into the anterior aspect of the knee and used the arthroscopic wand to resect the  medial plica.  Again, this was directly rubbing on the medial femoral condyle and there was an osteophyte here, which was documented with pictures as well as removal of the plica.  I then continued with synovectomy, demonstrating out the fronds were  being pinched within the joint and  using the arthroscopic wand and shaver to remove this area.  I did have to change portals here also.  I then had adequate visibility to go into the lateral compartment.  Here, the arthroscopic shaver was used to debride  the tear back to a stable edge that could not displace into the joint.  Most of the lateral  meniscus was intact and in good condition. Pre and post-pictures were taken here as well.  Instruments were then withdrawn from the knee after using the Arthrocare for some additional coagulation.  Then, turned attention to the femur with incisions through the old scar, carrying dissection down to the medial plate and removing all screws without complication.  More distally, I made incisions for the cannulated screws and withdrew them without difficulty. Wounds were irrigated thoroughly and then the wounds  closed in standard layered fashion with 0 Vicryl, 2-0 Vicryl and 2-0 nylon.  The patient was awakened from anesthesia and transferred to PACU in stable condition.  Additional local anesthetic was injected  into the wounds and knee.  PROGNOSIS:  The patient will be weightbearing as tolerated with unrestricted range of motion. Progression of arthritis and eventual TKA would not be unanticipated. For now aggressive ice, elevation, then strengthening program has been encouraged. Will see back for removal of sutures in 10 days.

## 2023-08-07 NOTE — Telephone Encounter (Signed)
Last office visit 10/12/2022 exocrine Pancreatic insufficiency Erosive esophagitis IDA  Plan: Erosive esophagitis Continue omeprazole 40 mg twice daily before meals Recommend repeat EGD to confirm healing if patient is agreeable Last refill 06/02/2023 2 refills   Has EGD schedule for 08/29/2023  Due for a appointment with dr. Allegra Lai sent mychart message to patient

## 2023-08-07 NOTE — Therapy (Signed)
OUTPATIENT PHYSICAL THERAPY TREATMENT NOTE   Patient Name: Harold Smith MRN: [id number] DOB:May 03, 1966, 57 y.o., male Today's Date: 08/07/2023  END OF SESSION:  PT End of Session - 08/07/23 0955     Visit Number 7    Number of Visits 13    Date for PT Re-Evaluation 10/02/23    Authorization Type [employer] 2024 reporting period from 07/10/2023    Authorization Time Period VL: 30 combined PT/OT with [employer]/based on auth from [employer]    Authorization - Visit Number 9    Authorization - Number of Visits 30    Progress Note Due on Visit 10    PT Start Time 0954   arrived 9:51a   PT Stop Time 1045    PT Time Calculation (min) 51 min    Activity Tolerance Patient limited by pain    Behavior During Therapy [facility] for tasks assessed/performed               Past Medical History:  Diagnosis Date   Allergic rhinitis due to pollen    Anemia    Anxiety    Chronic pain    Colon polyps    Depression    Diverticulitis large intestine    GERD (gastroesophageal reflux disease)    HTN (hypertension)    Irritable bowel syndrome    Metabolic bone disease 09/01/2022   Obesity    Osteoarthritis, knee    Sleep apnea    does not use cpap, pt states he no longer needed CPAP due to weight loss   Sleep disturbance    Vitamin D deficiency 09/01/2022   Past Surgical History:  Procedure Laterality Date   ABDOMINAL EXPOSURE N/A 03/06/2023   Procedure: ABDOMINAL EXPOSURE;  Surgeon: Cephus Shelling, MD;  Location: [facility] OR;  Service: Vascular;  Laterality: N/A;   ACHILLES TENDON REPAIR Right 12/26/2006   ANTERIOR LUMBAR FUSION N/A 03/06/2023   Procedure: L5-S1 ANTERIOR LUMBAR FUSION 1 LEVEL;  Surgeon: London Sheer, MD;  Location: [facility] OR;  Service: Orthopedics;  Laterality: N/A;   CATARACT EXTRACTION W/PHACO Left 07/20/2021   Procedure: CATARACT EXTRACTION PHACO AND INTRAOCULAR LENS PLACEMENT (IOC) LEFT 2.11 00:24.0;  Surgeon: Galen Manila, MD;  Location: [facility]  SURGERY CNTR;  Service: Ophthalmology;  Laterality: Left;  sleep apnea   CATARACT EXTRACTION W/PHACO Right 08/03/2021   Procedure: CATARACT EXTRACTION PHACO AND INTRAOCULAR LENS PLACEMENT (IOC) RIGHT;  Surgeon: Galen Manila, MD;  Location: [facility] SURGERY CNTR;  Service: Ophthalmology;  Laterality: Right;  3.19 0:33.0   CHOLECYSTECTOMY     COLON RESECTION Left 12/26/2004   due to diverticular disease at [facility]   COLONOSCOPY  2013?   COLONOSCOPY N/A 11/30/2022   Procedure: COLONOSCOPY;  Surgeon: Toney Reil, MD;  Location: [facility] ENDOSCOPY;  Service: Gastroenterology;  Laterality: N/A;   COLONOSCOPY WITH PROPOFOL N/A 05/22/2018   Procedure: COLONOSCOPY WITH PROPOFOL;  Surgeon: Toney Reil, MD;  Location: [facility] ENDOSCOPY;  Service: Gastroenterology;  Laterality: N/A;   COLONOSCOPY WITH PROPOFOL N/A 10/22/2020   Procedure: COLONOSCOPY WITH PROPOFOL;  Surgeon: Toney Reil, MD;  Location: [facility] SURGERY CNTR;  Service: Endoscopy;  Laterality: N/A;  priority 4   COLONOSCOPY WITH PROPOFOL N/A 11/10/2021   Procedure: COLONOSCOPY WITH PROPOFOL;  Surgeon: Toney Reil, MD;  Location: [facility] ENDOSCOPY;  Service: Gastroenterology;  Laterality: N/A;   COLONOSCOPY WITH PROPOFOL N/A 06/16/2022   Procedure: COLONOSCOPY WITH PROPOFOL;  Surgeon: Toney Reil, MD;  Location: [facility] SURGERY CNTR;  Service: Endoscopy;  Laterality: N/A;  COLONOSCOPY WITH PROPOFOL N/A 11/29/2022   Procedure: COLONOSCOPY WITH PROPOFOL;  Surgeon: Toney Reil, MD;  Location: [facility] ENDOSCOPY;  Service: Gastroenterology;  Laterality: N/A;   DRUG INDUCED ENDOSCOPY N/A 06/10/2019   Procedure: DRUG INDUCED SLEEP ENDOSCOPY;  Surgeon: Osborn Coho, MD;  Location: [facility];  Service: ENT;  Laterality: N/A;   ESOPHAGOGASTRODUODENOSCOPY (EGD) WITH PROPOFOL N/A 06/16/2022   Procedure: ESOPHAGOGASTRODUODENOSCOPY (EGD) WITH PROPOFOL;  Surgeon: Toney Reil, MD;  Location: [facility]  SURGERY CNTR;  Service: Endoscopy;  Laterality: N/A;   ESOPHAGOGASTRODUODENOSCOPY (EGD) WITH PROPOFOL N/A 05/24/2023   Procedure: ESOPHAGOGASTRODUODENOSCOPY (EGD) WITH PROPOFOL;  Surgeon: Toney Reil, MD;  Location: [facility] ENDOSCOPY;  Service: Gastroenterology;  Laterality: N/A;   GASTRIC BYPASS N/A 12/27/2007   HARDWARE REMOVAL Right 06/22/2023   Procedure: HARDWARE REMOVAL;  Surgeon: Myrene Galas, MD;  Location: [facility] OR;  Service: Orthopedics;  Laterality: Right;   HIATAL HERNIA REPAIR  09/25/2014   at [facility]   KNEE ARTHROSCOPY Right 06/22/2023   Procedure: ARTHROSCOPY KNEE;  Surgeon: Myrene Galas, MD;  Location: [facility] OR;  Service: Orthopedics;  Laterality: Right;   ORIF FEMUR FRACTURE Right 08/30/2022   Procedure: OPEN REDUCTION INTERNAL FIXATION (ORIF) DISTAL FEMUR FRACTURE;  Surgeon: Myrene Galas, MD;  Location: [facility] OR;  Service: Orthopedics;  Laterality: Right;   POLYPECTOMY  10/22/2020   Procedure: POLYPECTOMY;  Surgeon: Toney Reil, MD;  Location: [facility] SURGERY CNTR;  Service: Endoscopy;;   POLYPECTOMY  06/16/2022   Procedure: POLYPECTOMY;  Surgeon: Toney Reil, MD;  Location: [facility] SURGERY CNTR;  Service: Endoscopy;;   ROTATOR CUFF REPAIR Right    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   Patient Active Problem List   Diagnosis Date Noted   Chronic pain syndrome 07/12/2023   Anxiety 09/01/2022   Vitamin D deficiency 09/01/2022   Metabolic bone disease  09/01/2022   Displaced supracondylar fracture of distal end of right femur without intracondylar extension (HCC) 08/29/2022   Mood disorder (HCC) 07/08/2022   Erosive esophagitis    Disability of walking 06/01/2022   Degeneration of lumbar intervertebral disc 06/01/2022   Iron deficiency anemia 05/10/2022   Lumbar spondylolysis 02/25/2022   Sleep disturbance 07/02/2021   Preventative health care 06/23/2018   Personal hx of gastric bypass 06/23/2018   Erectile dysfunction 06/23/2018   Hx of  adenomatous colonic polyps    Essential hypertension 10/17/2013   Diverticulosis large intestine w/o perforation or abscess w/bleeding    Colon polyps     PCP:  Karie Schwalbe, MD  REFERRING PROVIDER: Doralee Albino. Carola Frost, MD  REFERRING DIAG: right knee arthritis post op removal of hardware plus scope  THERAPY DIAG:  Chronic pain of right knee  Stiffness of right knee, not elsewhere classified  Muscle weakness (generalized)  Difficulty in walking, not elsewhere classified  Rationale for Evaluation and Treatment: Rehabilitation  ONSET DATE: Original knee injury 08/2022. ORIF 08/28/2022. Hardware removal 06/22/2023.   PERTINENT HISTORY: Patient is a 57 y.o. male who presents to outpatient physical therapy with a referral for medical diagnosis right knee arthritis post op removal of hardware plus scope. This patient's chief complaints consist of right knee pain, instability, stiffness and weakness leading to the following functional deficits: difficulty with completing weight bearing activities without falling, bathing, walking, stairs, transfers, sleeping, dressing, driving. Relevant past medical history and comorbidities include s/p hardware removal and arthroscopy of R knee on 06/22/2023 (hardware originally for distal medial condyle fracture);  s/p L5-S1 ANTERIOR LUMBAR FUSION on  03/06/2023; Diverticulosis large intestine w/o perforation or abscess w/bleeding; Essential hypertension; Rectal bleeding; Hx of adenomatous colonic polyps; Personal hx of gastric bypass; Erectile dysfunction; Sleep disturbance; Lumbar spondylolysis; Microcytic anemia; Iron deficiency anemia; Numbness and tingling; Imbalance; Difficulty sleeping; Disability of walking; Pain in joint involving ankle and foot; Degeneration of lumbar intervertebral disc; Erosive esophagitis; Mood disorder (HCC); Displaced supracondylar fracture of distal end of right femur without intracondylar extension (HCC); Anxiety; Vitamin D  deficiency; Metabolic bone disease; and Radiculopathy, lumbar region. Please see above for relevant surgery history; osteoporosis, bowel/urinary incontinence, stumbling and dropping things. Patient denies hx of cancer, stroke, seizures, lung problems, heart problems, diabetes, and unexplained weight loss.  SUBJECTIVE:   SUBJECTIVE STATEMENT: Patient arrives with SPC in R UE and soft brace on R knee. He states his back is "whacked" and has been really hurting him for the last 2 days for no apparent reason. He states his knee was sore after last PT session, and that eased off since then. He is still doing his knee extension exercises in his "man cave." He had a relatively quiet weekend.   PAIN:  NPRS: 6510 in his right knee and 10/10 low back.   PRECAUTIONS: fall, back precautions since lumbar surgery (no lifting over 5#, repetitive lumbar bending/stooping, any repetitive lumbar motions)  PATIENT GOALS: "hopefully to make some improvement, especially with the pain"  NEXT MD VISIT: 08/09/2023  OBJECTIVE  TODAY'S TREATMENT:   Therapeutic exercise: to centralize symptoms and improve ROM, strength, muscular endurance, and activity tolerance required for successful completion of functional activities.  - Recumbent Bike level 1 (requested by patient), seat position 16. For improved lower extremity ROM, muscular endurance, and activity tolerance; and to induce the analgesic effect of aerobic exercise, stimulate joint nutrition, and prepare body structures and systems for following interventions. X 5 minutes keeping RPM in 80s.   - Squats with BUE support focusing on glute activation and proper form with hip hinging, stabilized back, and tibial perpendicular to floor with knees behind toes. 3x10.   Circuit:  - seated quad extension at [employer] machine, seat position 2, bilateral LE 1x10 at 40#, single leg 4x8/09/04/09 at R at 20/215/15/15# and 3x10/9/10 L at 35#.   - seated hamstring curl at [facility] machine,  seat position 2, bilateral LE 1x10 at 55#, single leg R 2x10 each side at 35#, L 2x10 at 45/40#.   Modality: for improved quad activation Seated RUSSIAN NMES estim to right quad, seated at knee extension machine, seat position 3, with knee at 60 degrees flexion and stabilized to not move, CC, 10/50 cycle, 50 bps, 50% duty cycle, 2 second ramp, anti-fatigue off. Intensity up to 100 mA (machine max).  Skin WFL before and after application. Skin cleaned with soap and water prior to application of 3x5 inch rectangular electrodes over distal quad and proximal motor point. MVIC tested just prior to application and found to be approx 55#, unable to reach 50% MVIC despite maxing out intensity of modality during NMES. Patient did not contribute volitional contraction during delivery of stimulation. 15 minutes. Patient tolerated well. Unattended/intermittent attendance by PT after set up and before removal.  Pt required multimodal cuing for proper technique and to facilitate improved neuromuscular control, strength, range of motion, and functional ability resulting in improved performance and form.  PATIENT EDUCATION:  Education details: Exercise purpose/form. Self management techniques. Education on diagnosis, prognosis, POC, anatomy and physiology of current condition Education on HEP including handout  Person educated: Patient Education method: Explanation,  Demonstration, Tactile cues, Verbal cues, and Handouts Education comprehension: verbalized understanding, returned demonstration, and needs further education  HOME EXERCISE PROGRAM: Access Code: [email or web address] URL: [email or web address] Date: 08/01/2023 Prepared by: Norton Blizzard  Exercises - Supine Heel Slide  - 3 x daily - 1 sets - 20 reps - 5 seconds hold - 4 Way Patellar Glide  - 3 x daily - 1 sets - 10 reps - 2 seconds hold - Standing Terminal Knee Extension with Resistance  - 1 x daily - 2 sets - 10 reps - 5 seconds hold - Supine  Single Knee to Chest Stretch with Neck Flexion  - 1-2 x daily - 2 sets - 10 reps - 5 seconds hold  ASSESSMENT:  CLINICAL IMPRESSION: Patient arrives reporting increased low back pain and tolerable R knee pain. Continued with exercises focused on quad and hamstring strength, while attempting to avoid further aggravating low back pain. Patient did report increased low back pain with effort of leg exercises. Cuing to breathe out during exertion improved effect on back pain. Patient remains motivated to improve his strength and function. Did not get good contraction with estim today for unknown reason (repositioning pads and checking connections did not result improved recruitment). Patient would benefit from continued management of limiting condition by skilled physical therapist to address remaining impairments and functional limitations to work towards stated goals and return to PLOF or maximal functional independence.   From initial PT Eval 07/10/2023:  Patient is a 57 y.o. male referred to outpatient physical therapy with a medical diagnosis of right knee arthritis post op removal of hardware plus scope who presents with signs and symptoms consistent with right knee pain, stiffness, and weakness s/p hardware removal and arthroscopy on 06/22/2023. Patient presents with significant pain, posture, ROM, joint stiffness, tissue tension, tissue integrity, motor control, muscle performance (strength/power/endurance), gait, balance, and activity tolerance impairments that are limiting ability to complete usual activities including completing weight bearing activities without falling, bathing, walking, stairs, transfers, sleeping, dressing, driving without difficulty. Patient will benefit from skilled physical therapy intervention to address current body structure impairments and activity limitations to improve function and work towards goals set in current POC in order to return to prior level of function or maximal  functional improvement.   OBJECTIVE IMPAIRMENTS: Abnormal gait, decreased activity tolerance, decreased balance, decreased endurance, decreased knowledge of condition, decreased mobility, difficulty walking, decreased ROM, decreased strength, hypomobility, increased edema, increased fascial restrictions, impaired perceived functional ability, improper body mechanics, postural dysfunction, and pain.   ACTIVITY LIMITATIONS: carrying, lifting, bending, standing, squatting, sleeping, stairs, transfers, bed mobility, bathing, dressing, hygiene/grooming, and locomotion level  PARTICIPATION LIMITATIONS: interpersonal relationship, driving, shopping, community activity, occupation, and yard work  PERSONAL FACTORS: Fitness, Past/current experiences, Time since onset of injury/illness/exacerbation, and 3+ comorbidities:   s/p hardware removal and arthroscopy of R knee on 06/22/2023 (hardware originally for distal medial condyle fracture);  s/p L5-S1 ANTERIOR LUMBAR FUSION on 03/06/2023; Diverticulosis large intestine w/o perforation or abscess w/bleeding; Essential hypertension; Rectal bleeding; Hx of adenomatous colonic polyps; Personal hx of gastric bypass; Erectile dysfunction; Sleep disturbance; Lumbar spondylolysis; Microcytic anemia; Iron deficiency anemia; Numbness and tingling; Imbalance; Difficulty sleeping; Disability of walking; Pain in joint involving ankle and foot; Degeneration of lumbar intervertebral disc; Erosive esophagitis; Mood disorder (HCC); Displaced supracondylar fracture of distal end of right femur without intracondylar extension (HCC); Anxiety; Vitamin D deficiency; Metabolic bone disease; and Radiculopathy, lumbar region. Please see above for relevant surgery history; osteoporosis, bowel/urinary incontinence, stumbling and dropping things  are also affecting patient's functional outcome.   REHAB POTENTIAL: Good  CLINICAL DECISION MAKING: Evolving/moderate complexity  EVALUATION  COMPLEXITY: Moderate   GOALS: Goals reviewed with patient? No  SHORT TERM GOALS: Target date: 07/24/2023  Patient will be independent with initial home exercise program for self-management of symptoms. Baseline: Initial HEP provided at IE (07/10/23); Goal status: In-progress   LONG TERM GOALS: Target date: 10/02/2023  Patient will be independent with a long-term home exercise program for self-management of symptoms.  Baseline: Initial HEP provided at IE (07/10/23); Goal status: In-progress  2.  Patient will demonstrate improved FOTO to equal or greater than 30 by visit #10 to demonstrate improvement in overall condition and self-reported functional ability.  Baseline: 15 (07/10/23); 33 at visit #5 (08/01/2023);  Goal status: In-progress  3.  Patient will complete 5 Time Sit to Stand Test in equal or less than 15 seconds from 18.5 inch or less surface without UE support to demonstrate improved LE strength for household and community mobility.  Baseline: 49 seconds from 18.5 inch plinth with heavy B UE support and compensation with lack of R knee flexion and weight bearing mostly on L LE.  (07/10/23); Goal status: In-progress  4.  Patient will ambulate equal or greater than 1000 feet with LRAD on 6 Minute Walk Test to improve his community mobility.  Baseline: to be tested visit 2 as appropriate (07/10/23); 772 feet with SPC with pain up to 8/10 in right leg and feeling heavy (07/17/2023);  Goal status: In-progress  5.  Patient will demonstrate R knee extension strength within 80% of L knee extension strength to improve his ability to ascend and descend stairs.   Baseline: L knee extension strength to be tested visit 2 as appropriate (07/10/23); Goal status: In-progress  6.  Patient will demonstrate R knee PROM equal or greater to 0-125 degrees to improve his ability to ascend and descend and walk community distances.  Baseline: 9-0-126 (07/10/2023);  Goal status:  In-progress    PLAN:  PT FREQUENCY: 1-2x/week  PT DURATION: 12 weeks  PLANNED INTERVENTIONS: Therapeutic exercises, Therapeutic activity, Neuromuscular re-education, Balance training, Gait training, Patient/Family education, Self Care, Joint mobilization, Stair training, DME instructions, Aquatic Therapy, Dry Needling, Electrical stimulation, Spinal mobilization, Cryotherapy, Moist heat, Manual therapy, and Re-evaluation  PLAN FOR NEXT SESSION: Progressive LE/functional strengthening and ROM to especially focus on R knee extension ROM  and quad strength as tolerated. Education. Balance and proprioception exercises as needed. Manual therapy as needed.    Cira Rue, PT, DPT 08/07/2023, 10:55 AM  [facility] [address] [place], [place] [address] P: [phone] I F: [phone]

## 2023-08-10 ENCOUNTER — Encounter: Payer: Self-pay | Admitting: Physical Therapy

## 2023-08-10 ENCOUNTER — Ambulatory Visit: Payer: 59 | Admitting: Physical Therapy

## 2023-08-10 DIAGNOSIS — M25661 Stiffness of right knee, not elsewhere classified: Secondary | ICD-10-CM

## 2023-08-10 DIAGNOSIS — R262 Difficulty in walking, not elsewhere classified: Secondary | ICD-10-CM | POA: Diagnosis not present

## 2023-08-10 DIAGNOSIS — G8929 Other chronic pain: Secondary | ICD-10-CM

## 2023-08-10 DIAGNOSIS — M6281 Muscle weakness (generalized): Secondary | ICD-10-CM | POA: Diagnosis not present

## 2023-08-10 DIAGNOSIS — M25561 Pain in right knee: Secondary | ICD-10-CM | POA: Diagnosis not present

## 2023-08-10 NOTE — Therapy (Signed)
OUTPATIENT PHYSICAL THERAPY TREATMENT NOTE   Patient Name: Harold Smith MRN: [id number] DOB:12/10/1966, 57 y.o., male Today's Date: 08/10/2023  END OF SESSION:  PT End of Session - 08/10/23 1043     Visit Number 8    Number of Visits 13    Date for PT Re-Evaluation 10/02/23    Authorization Type [employer] 2024 reporting period from 07/10/2023    Authorization Time Period VL: 30 combined PT/OT with [employer]/based on auth from [employer]    Authorization - Visit Number 10    Authorization - Number of Visits 30    Progress Note Due on Visit 10    PT Start Time 1040    PT Stop Time 1118    PT Time Calculation (min) 38 min    Activity Tolerance Patient limited by pain    Behavior During Therapy WFL for tasks assessed/performed                Past Medical History:  Diagnosis Date   Allergic rhinitis due to pollen    Anemia    Anxiety    Chronic pain    Colon polyps    Depression    Diverticulitis large intestine    GERD (gastroesophageal reflux disease)    HTN (hypertension)    Irritable bowel syndrome    Metabolic bone disease 09/01/2022   Obesity    Osteoarthritis, knee    Sleep apnea    does not use cpap, pt states he no longer needed CPAP due to weight loss   Sleep disturbance    Vitamin D deficiency 09/01/2022   Past Surgical History:  Procedure Laterality Date   ABDOMINAL EXPOSURE N/A 03/06/2023   Procedure: ABDOMINAL EXPOSURE;  Surgeon: Cephus Shelling, MD;  Location: [facility] OR;  Service: Vascular;  Laterality: N/A;   ACHILLES TENDON REPAIR Right 12/26/2006   ANTERIOR LUMBAR FUSION N/A 03/06/2023   Procedure: L5-S1 ANTERIOR LUMBAR FUSION 1 LEVEL;  Surgeon: London Sheer, MD;  Location: [facility] OR;  Service: Orthopedics;  Laterality: N/A;   CATARACT EXTRACTION W/PHACO Left 07/20/2021   Procedure: CATARACT EXTRACTION PHACO AND INTRAOCULAR LENS PLACEMENT (IOC) LEFT 2.11 00:24.0;  Surgeon: Galen Manila, MD;  Location: [facility];   Service: Ophthalmology;  Laterality: Left;  sleep apnea   CATARACT EXTRACTION W/PHACO Right 08/03/2021   Procedure: CATARACT EXTRACTION PHACO AND INTRAOCULAR LENS PLACEMENT (IOC) RIGHT;  Surgeon: Galen Manila, MD;  Location: [facility];  Service: Ophthalmology;  Laterality: Right;  3.19 0:33.0   CHOLECYSTECTOMY     COLON RESECTION Left 12/26/2004   due to diverticular disease at [facility]   COLONOSCOPY  2013?   COLONOSCOPY N/A 11/30/2022   Procedure: COLONOSCOPY;  Surgeon: Toney Reil, MD;  Location: [facility] ENDOSCOPY;  Service: Gastroenterology;  Laterality: N/A;   COLONOSCOPY WITH PROPOFOL N/A 05/22/2018   Procedure: COLONOSCOPY WITH PROPOFOL;  Surgeon: Toney Reil, MD;  Location: [facility] ENDOSCOPY;  Service: Gastroenterology;  Laterality: N/A;   COLONOSCOPY WITH PROPOFOL N/A 10/22/2020   Procedure: COLONOSCOPY WITH PROPOFOL;  Surgeon: Toney Reil, MD;  Location: [facility];  Service: Endoscopy;  Laterality: N/A;  priority 4   COLONOSCOPY WITH PROPOFOL N/A 11/10/2021   Procedure: COLONOSCOPY WITH PROPOFOL;  Surgeon: Toney Reil, MD;  Location: [facility] ENDOSCOPY;  Service: Gastroenterology;  Laterality: N/A;   COLONOSCOPY WITH PROPOFOL N/A 06/16/2022   Procedure: COLONOSCOPY WITH PROPOFOL;  Surgeon: Toney Reil, MD;  Location: [facility];  Service: Endoscopy;  Laterality: N/A;  COLONOSCOPY WITH PROPOFOL N/A 11/29/2022   Procedure: COLONOSCOPY WITH PROPOFOL;  Surgeon: Toney Reil, MD;  Location: [facility] ENDOSCOPY;  Service: Gastroenterology;  Laterality: N/A;   DRUG INDUCED ENDOSCOPY N/A 06/10/2019   Procedure: DRUG INDUCED SLEEP ENDOSCOPY;  Surgeon: Osborn Coho, MD;  Location: [facility];  Service: ENT;  Laterality: N/A;   ESOPHAGOGASTRODUODENOSCOPY (EGD) WITH PROPOFOL N/A 06/16/2022   Procedure: ESOPHAGOGASTRODUODENOSCOPY (EGD) WITH PROPOFOL;  Surgeon: Toney Reil, MD;  Location: [facility];   Service: Endoscopy;  Laterality: N/A;   ESOPHAGOGASTRODUODENOSCOPY (EGD) WITH PROPOFOL N/A 05/24/2023   Procedure: ESOPHAGOGASTRODUODENOSCOPY (EGD) WITH PROPOFOL;  Surgeon: Toney Reil, MD;  Location: [facility] ENDOSCOPY;  Service: Gastroenterology;  Laterality: N/A;   GASTRIC BYPASS N/A 12/27/2007   HARDWARE REMOVAL Right 06/22/2023   Procedure: HARDWARE REMOVAL;  Surgeon: Myrene Galas, MD;  Location: [facility] OR;  Service: Orthopedics;  Laterality: Right;   HIATAL HERNIA REPAIR  09/25/2014   at [facility]   KNEE ARTHROSCOPY Right 06/22/2023   Procedure: ARTHROSCOPY KNEE;  Surgeon: Myrene Galas, MD;  Location: [facility] OR;  Service: Orthopedics;  Laterality: Right;   ORIF FEMUR FRACTURE Right 08/30/2022   Procedure: OPEN REDUCTION INTERNAL FIXATION (ORIF) DISTAL FEMUR FRACTURE;  Surgeon: Myrene Galas, MD;  Location: [facility] OR;  Service: Orthopedics;  Laterality: Right;   POLYPECTOMY  10/22/2020   Procedure: POLYPECTOMY;  Surgeon: Toney Reil, MD;  Location: [facility];  Service: Endoscopy;;   POLYPECTOMY  06/16/2022   Procedure: POLYPECTOMY;  Surgeon: Toney Reil, MD;  Location: [facility];  Service: Endoscopy;;   ROTATOR CUFF REPAIR Right    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   Patient Active Problem List   Diagnosis Date Noted   Chronic pain syndrome 07/12/2023   Anxiety 09/01/2022   Vitamin D deficiency 09/01/2022   Metabolic bone disease  09/01/2022   Displaced supracondylar fracture of distal end of right femur without intracondylar extension (HCC) 08/29/2022   Mood disorder (HCC) 07/08/2022   Erosive esophagitis    Disability of walking 06/01/2022   Degeneration of lumbar intervertebral disc 06/01/2022   Iron deficiency anemia 05/10/2022   Lumbar spondylolysis 02/25/2022   Sleep disturbance 07/02/2021   Preventative health care 06/23/2018   Personal hx of gastric bypass 06/23/2018   Erectile dysfunction 06/23/2018   Hx of adenomatous colonic  polyps    Essential hypertension 10/17/2013   Diverticulosis large intestine w/o perforation or abscess w/bleeding    Colon polyps     PCP:  Karie Schwalbe, MD  REFERRING PROVIDER: Doralee Albino. Carola Frost, MD  REFERRING DIAG: right knee arthritis post op removal of hardware plus scope  THERAPY DIAG:  Chronic pain of right knee  Stiffness of right knee, not elsewhere classified  Muscle weakness (generalized)  Difficulty in walking, not elsewhere classified  Rationale for Evaluation and Treatment: Rehabilitation  ONSET DATE: Original knee injury 08/2022. ORIF 08/28/2022. Hardware removal 06/22/2023.   PERTINENT HISTORY: Patient is a 57 y.o. male who presents to outpatient physical therapy with a referral for medical diagnosis right knee arthritis post op removal of hardware plus scope. This patient's chief complaints consist of right knee pain, instability, stiffness and weakness leading to the following functional deficits: difficulty with completing weight bearing activities without falling, bathing, walking, stairs, transfers, sleeping, dressing, driving. Relevant past medical history and comorbidities include s/p hardware removal and arthroscopy of R knee on 06/22/2023 (hardware originally for distal medial condyle fracture);  s/p L5-S1 ANTERIOR LUMBAR FUSION on  03/06/2023; Diverticulosis large intestine w/o perforation or abscess w/bleeding; Essential hypertension; Rectal bleeding; Hx of adenomatous colonic polyps; Personal hx of gastric bypass; Erectile dysfunction; Sleep disturbance; Lumbar spondylolysis; Microcytic anemia; Iron deficiency anemia; Numbness and tingling; Imbalance; Difficulty sleeping; Disability of walking; Pain in joint involving ankle and foot; Degeneration of lumbar intervertebral disc; Erosive esophagitis; Mood disorder (HCC); Displaced supracondylar fracture of distal end of right femur without intracondylar extension (HCC); Anxiety; Vitamin D deficiency; Metabolic bone  disease; and Radiculopathy, lumbar region. Please see above for relevant surgery history; osteoporosis, bowel/urinary incontinence, stumbling and dropping things. Patient denies hx of cancer, stroke, seizures, lung problems, heart problems, diabetes, and unexplained weight loss.  SUBJECTIVE:   SUBJECTIVE STATEMENT: Patient arrives with SPC in R UE and hinged brace on knee. He states he had quad soreness after last PT session. His back is feeling a little better today.  He saw Dr. Carola Frost who provided a new PT order that states the following:  closed chain quad strengthening leg press elliptical  home program    PAIN:  NPRS: 7/10 in his right knee and 8/10 low back.   PRECAUTIONS: fall, back precautions since lumbar surgery (no lifting over 5#, repetitive lumbar bending/stooping, any repetitive lumbar motions)  PATIENT GOALS: "hopefully to make some improvement, especially with the pain"  NEXT MD VISIT: 08/09/2023  OBJECTIVE  TODAY'S TREATMENT:   Therapeutic exercise: to centralize symptoms and improve ROM, strength, muscular endurance, and activity tolerance required for successful completion of functional activities.  - Recumbent Bike level 1 (requested by patient), seat position 16. For improved lower extremity ROM, muscular endurance, and activity tolerance; and to induce the analgesic effect of aerobic exercise, stimulate joint nutrition, and prepare body structures and systems for following interventions. X 5 minutes keeping RPM in 80s.   - Squats with BUE support focusing on glute activation and proper form with hip hinging, stabilized back, and tibial perpendicular to floor with knees behind toes. 3x10. (Patient using less UE strength).  - seated leg press at [facility] machine, pillow behind back, seat position 6/6/3,  bilateral LE 1x10 at 35#, single leg 4x10 at R at 20/35/65/65# (pt requested increased weight) and 3x10 L at 45/65/95#.  (Pt reports he feels good effort at max weight used  today).  - step up to 8 inch step, 3x10 R LE, 2x10 L LE. U UE support.   Pt required multimodal cuing for proper technique and to facilitate improved neuromuscular control, strength, range of motion, and functional ability resulting in improved performance and form.  PATIENT EDUCATION:  Education details: Exercise purpose/form. Self management techniques. Education on diagnosis, prognosis, POC, anatomy and physiology of current condition Education on HEP including handout  Person educated: Patient Education method: Explanation, Demonstration, Tactile cues, Verbal cues, and Handouts Education comprehension: verbalized understanding, returned demonstration, and needs further education  HOME EXERCISE PROGRAM: Access Code: [email or web address] URL: [email or web address] Date: 08/01/2023 Prepared by: Norton Blizzard  Exercises - Supine Heel Slide  - 3 x daily - 1 sets - 20 reps - 5 seconds hold - 4 Way Patellar Glide  - 3 x daily - 1 sets - 10 reps - 2 seconds hold - Standing Terminal Knee Extension with Resistance  - 1 x daily - 2 sets - 10 reps - 5 seconds hold - Supine Single Knee to Chest Stretch with Neck Flexion  - 1-2 x daily - 2 sets - 10 reps - 5 seconds hold  ASSESSMENT:  CLINICAL IMPRESSION: Patient arrives with updated PT order  from surgeon emphasizing closed chain quad strengthening, so open chain exercises were removed or minimized from program. Patient tolerated leg press and step ups better than PT expected and had no worsening of back pain with these, despite pressure from the leg press at his posterior pelvis. Patient continues to participate well and requests increased weight and challenge. Plan to assess for continued sufficient tolerance of exercises and continue with progressive strengthening next session. Patient advised to perform squats with B UE support instead of quad extension machine at home. Patient would benefit from continued management of limiting condition by  skilled physical therapist to address remaining impairments and functional limitations to work towards stated goals and return to PLOF or maximal functional independence.   From initial PT Eval 07/10/2023:  Patient is a 57 y.o. male referred to outpatient physical therapy with a medical diagnosis of right knee arthritis post op removal of hardware plus scope who presents with signs and symptoms consistent with right knee pain, stiffness, and weakness s/p hardware removal and arthroscopy on 06/22/2023. Patient presents with significant pain, posture, ROM, joint stiffness, tissue tension, tissue integrity, motor control, muscle performance (strength/power/endurance), gait, balance, and activity tolerance impairments that are limiting ability to complete usual activities including completing weight bearing activities without falling, bathing, walking, stairs, transfers, sleeping, dressing, driving without difficulty. Patient will benefit from skilled physical therapy intervention to address current body structure impairments and activity limitations to improve function and work towards goals set in current POC in order to return to prior level of function or maximal functional improvement.   OBJECTIVE IMPAIRMENTS: Abnormal gait, decreased activity tolerance, decreased balance, decreased endurance, decreased knowledge of condition, decreased mobility, difficulty walking, decreased ROM, decreased strength, hypomobility, increased edema, increased fascial restrictions, impaired perceived functional ability, improper body mechanics, postural dysfunction, and pain.   ACTIVITY LIMITATIONS: carrying, lifting, bending, standing, squatting, sleeping, stairs, transfers, bed mobility, bathing, dressing, hygiene/grooming, and locomotion level  PARTICIPATION LIMITATIONS: interpersonal relationship, driving, shopping, community activity, occupation, and yard work  PERSONAL FACTORS: Fitness, Past/current experiences, Time  since onset of injury/illness/exacerbation, and 3+ comorbidities:   s/p hardware removal and arthroscopy of R knee on 06/22/2023 (hardware originally for distal medial condyle fracture);  s/p L5-S1 ANTERIOR LUMBAR FUSION on 03/06/2023; Diverticulosis large intestine w/o perforation or abscess w/bleeding; Essential hypertension; Rectal bleeding; Hx of adenomatous colonic polyps; Personal hx of gastric bypass; Erectile dysfunction; Sleep disturbance; Lumbar spondylolysis; Microcytic anemia; Iron deficiency anemia; Numbness and tingling; Imbalance; Difficulty sleeping; Disability of walking; Pain in joint involving ankle and foot; Degeneration of lumbar intervertebral disc; Erosive esophagitis; Mood disorder (HCC); Displaced supracondylar fracture of distal end of right femur without intracondylar extension (HCC); Anxiety; Vitamin D deficiency; Metabolic bone disease; and Radiculopathy, lumbar region. Please see above for relevant surgery history; osteoporosis, bowel/urinary incontinence, stumbling and dropping things are also affecting patient's functional outcome.   REHAB POTENTIAL: Good  CLINICAL DECISION MAKING: Evolving/moderate complexity  EVALUATION COMPLEXITY: Moderate   GOALS: Goals reviewed with patient? No  SHORT TERM GOALS: Target date: 07/24/2023  Patient will be independent with initial home exercise program for self-management of symptoms. Baseline: Initial HEP provided at IE (07/10/23); Goal status: In-progress   LONG TERM GOALS: Target date: 10/02/2023  Patient will be independent with a long-term home exercise program for self-management of symptoms.  Baseline: Initial HEP provided at IE (07/10/23); Goal status: In-progress  2.  Patient will demonstrate improved FOTO to equal or greater than 30 by visit #10 to demonstrate improvement in overall condition and  self-reported functional ability.  Baseline: 15 (07/10/23); 33 at visit #5 (08/01/2023);  Goal status: In-progress  3.   Patient will complete 5 Time Sit to Stand Test in equal or less than 15 seconds from 18.5 inch or less surface without UE support to demonstrate improved LE strength for household and community mobility.  Baseline: 49 seconds from 18.5 inch plinth with heavy B UE support and compensation with lack of R knee flexion and weight bearing mostly on L LE.  (07/10/23); Goal status: In-progress  4.  Patient will ambulate equal or greater than 1000 feet with LRAD on 6 Minute Walk Test to improve his community mobility.  Baseline: to be tested visit 2 as appropriate (07/10/23); 772 feet with SPC with pain up to 8/10 in right leg and feeling heavy (07/17/2023);  Goal status: In-progress  5.  Patient will demonstrate R knee extension strength within 80% of L knee extension strength to improve his ability to ascend and descend stairs.   Baseline: L knee extension strength to be tested visit 2 as appropriate (07/10/23); Goal status: In-progress  6.  Patient will demonstrate R knee PROM equal or greater to 0-125 degrees to improve his ability to ascend and descend and walk community distances.  Baseline: 9-0-126 (07/10/2023);  Goal status: In-progress    PLAN:  PT FREQUENCY: 1-2x/week  PT DURATION: 12 weeks  PLANNED INTERVENTIONS: Therapeutic exercises, Therapeutic activity, Neuromuscular re-education, Balance training, Gait training, Patient/Family education, Self Care, Joint mobilization, Stair training, DME instructions, Aquatic Therapy, Dry Needling, Electrical stimulation, Spinal mobilization, Cryotherapy, Moist heat, Manual therapy, and Re-evaluation  PLAN FOR NEXT SESSION: Progressive LE/functional strengthening and ROM to especially focus on R knee extension ROM  and quad strength as tolerated. Education. Balance and proprioception exercises as needed. Manual therapy as needed.    Cira Rue, PT, DPT 08/10/2023, 2:27 PM  [facility] [address] [place], [place] [address] P: [phone] I F: [phone]

## 2023-08-15 ENCOUNTER — Ambulatory Visit: Payer: 59 | Admitting: Physical Therapy

## 2023-08-15 ENCOUNTER — Encounter: Payer: Self-pay | Admitting: Physical Therapy

## 2023-08-15 DIAGNOSIS — G8929 Other chronic pain: Secondary | ICD-10-CM | POA: Diagnosis not present

## 2023-08-15 DIAGNOSIS — R262 Difficulty in walking, not elsewhere classified: Secondary | ICD-10-CM | POA: Diagnosis not present

## 2023-08-15 DIAGNOSIS — M25561 Pain in right knee: Secondary | ICD-10-CM | POA: Diagnosis not present

## 2023-08-15 DIAGNOSIS — M6281 Muscle weakness (generalized): Secondary | ICD-10-CM

## 2023-08-15 DIAGNOSIS — M25661 Stiffness of right knee, not elsewhere classified: Secondary | ICD-10-CM | POA: Diagnosis not present

## 2023-08-15 NOTE — Therapy (Signed)
OUTPATIENT PHYSICAL THERAPY TREATMENT NOTE   Patient Name: Harold Smith MRN: [id number] DOB:02/02/1966, 57 y.o., male Today's Date: 08/15/2023  END OF SESSION:  PT End of Session - 08/15/23 1314     Visit Number 9    Number of Visits 13    Date for PT Re-Evaluation 10/02/23    Authorization Type [employer] 2024 reporting period from 07/10/2023    Authorization Time Period VL: 30 combined PT/OT with [employer]/based on auth from [employer]    Authorization - Visit Number 13    Authorization - Number of Visits 30    Progress Note Due on Visit 10    PT Start Time 1302    PT Stop Time 1344    PT Time Calculation (min) 42 min    Activity Tolerance Patient limited by pain    Behavior During Therapy [facility] for tasks assessed/performed                 Past Medical History:  Diagnosis Date   Allergic rhinitis due to pollen    Anemia    Anxiety    Chronic pain    Colon polyps    Depression    Diverticulitis large intestine    GERD (gastroesophageal reflux disease)    HTN (hypertension)    Irritable bowel syndrome    Metabolic bone disease 09/01/2022   Obesity    Osteoarthritis, knee    Sleep apnea    does not use cpap, pt states he no longer needed CPAP due to weight loss   Sleep disturbance    Vitamin D deficiency 09/01/2022   Past Surgical History:  Procedure Laterality Date   ABDOMINAL EXPOSURE N/A 03/06/2023   Procedure: ABDOMINAL EXPOSURE;  Surgeon: Cephus Shelling, MD;  Location: [facility] OR;  Service: Vascular;  Laterality: N/A;   ACHILLES TENDON REPAIR Right 12/26/2006   ANTERIOR LUMBAR FUSION N/A 03/06/2023   Procedure: L5-S1 ANTERIOR LUMBAR FUSION 1 LEVEL;  Surgeon: London Sheer, MD;  Location: [facility] OR;  Service: Orthopedics;  Laterality: N/A;   CATARACT EXTRACTION W/PHACO Left 07/20/2021   Procedure: CATARACT EXTRACTION PHACO AND INTRAOCULAR LENS PLACEMENT (IOC) LEFT 2.11 00:24.0;  Surgeon: Galen Manila, MD;  Location: [facility];   Service: Ophthalmology;  Laterality: Left;  sleep apnea   CATARACT EXTRACTION W/PHACO Right 08/03/2021   Procedure: CATARACT EXTRACTION PHACO AND INTRAOCULAR LENS PLACEMENT (IOC) RIGHT;  Surgeon: Galen Manila, MD;  Location: [facility];  Service: Ophthalmology;  Laterality: Right;  3.19 0:33.0   CHOLECYSTECTOMY     COLON RESECTION Left 12/26/2004   due to diverticular disease at [facility]   COLONOSCOPY  2013?   COLONOSCOPY N/A 11/30/2022   Procedure: COLONOSCOPY;  Surgeon: Toney Reil, MD;  Location: [facility] ENDOSCOPY;  Service: Gastroenterology;  Laterality: N/A;   COLONOSCOPY WITH PROPOFOL N/A 05/22/2018   Procedure: COLONOSCOPY WITH PROPOFOL;  Surgeon: Toney Reil, MD;  Location: [facility] ENDOSCOPY;  Service: Gastroenterology;  Laterality: N/A;   COLONOSCOPY WITH PROPOFOL N/A 10/22/2020   Procedure: COLONOSCOPY WITH PROPOFOL;  Surgeon: Toney Reil, MD;  Location: [facility];  Service: Endoscopy;  Laterality: N/A;  priority 4   COLONOSCOPY WITH PROPOFOL N/A 11/10/2021   Procedure: COLONOSCOPY WITH PROPOFOL;  Surgeon: Toney Reil, MD;  Location: [facility] ENDOSCOPY;  Service: Gastroenterology;  Laterality: N/A;   COLONOSCOPY WITH PROPOFOL N/A 06/16/2022   Procedure: COLONOSCOPY WITH PROPOFOL;  Surgeon: Toney Reil, MD;  Location: [facility];  Service: Endoscopy;  Laterality: N/A;  COLONOSCOPY WITH PROPOFOL N/A 11/29/2022   Procedure: COLONOSCOPY WITH PROPOFOL;  Surgeon: Toney Reil, MD;  Location: [facility] ENDOSCOPY;  Service: Gastroenterology;  Laterality: N/A;   DRUG INDUCED ENDOSCOPY N/A 06/10/2019   Procedure: DRUG INDUCED SLEEP ENDOSCOPY;  Surgeon: Osborn Coho, MD;  Location: [facility];  Service: ENT;  Laterality: N/A;   ESOPHAGOGASTRODUODENOSCOPY (EGD) WITH PROPOFOL N/A 06/16/2022   Procedure: ESOPHAGOGASTRODUODENOSCOPY (EGD) WITH PROPOFOL;  Surgeon: Toney Reil, MD;  Location: [facility];   Service: Endoscopy;  Laterality: N/A;   ESOPHAGOGASTRODUODENOSCOPY (EGD) WITH PROPOFOL N/A 05/24/2023   Procedure: ESOPHAGOGASTRODUODENOSCOPY (EGD) WITH PROPOFOL;  Surgeon: Toney Reil, MD;  Location: [facility] ENDOSCOPY;  Service: Gastroenterology;  Laterality: N/A;   GASTRIC BYPASS N/A 12/27/2007   HARDWARE REMOVAL Right 06/22/2023   Procedure: HARDWARE REMOVAL;  Surgeon: Myrene Galas, MD;  Location: [facility] OR;  Service: Orthopedics;  Laterality: Right;   HIATAL HERNIA REPAIR  09/25/2014   at [facility]   KNEE ARTHROSCOPY Right 06/22/2023   Procedure: ARTHROSCOPY KNEE;  Surgeon: Myrene Galas, MD;  Location: [facility] OR;  Service: Orthopedics;  Laterality: Right;   ORIF FEMUR FRACTURE Right 08/30/2022   Procedure: OPEN REDUCTION INTERNAL FIXATION (ORIF) DISTAL FEMUR FRACTURE;  Surgeon: Myrene Galas, MD;  Location: [facility] OR;  Service: Orthopedics;  Laterality: Right;   POLYPECTOMY  10/22/2020   Procedure: POLYPECTOMY;  Surgeon: Toney Reil, MD;  Location: [facility];  Service: Endoscopy;;   POLYPECTOMY  06/16/2022   Procedure: POLYPECTOMY;  Surgeon: Toney Reil, MD;  Location: [facility];  Service: Endoscopy;;   ROTATOR CUFF REPAIR Right    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   Patient Active Problem List   Diagnosis Date Noted   Chronic pain syndrome 07/12/2023   Anxiety 09/01/2022   Vitamin D deficiency 09/01/2022   Metabolic bone disease  09/01/2022   Displaced supracondylar fracture of distal end of right femur without intracondylar extension (HCC) 08/29/2022   Mood disorder (HCC) 07/08/2022   Erosive esophagitis    Disability of walking 06/01/2022   Degeneration of lumbar intervertebral disc 06/01/2022   Iron deficiency anemia 05/10/2022   Lumbar spondylolysis 02/25/2022   Sleep disturbance 07/02/2021   Preventative health care 06/23/2018   Personal hx of gastric bypass 06/23/2018   Erectile dysfunction 06/23/2018   Hx of adenomatous colonic  polyps    Essential hypertension 10/17/2013   Diverticulosis large intestine w/o perforation or abscess w/bleeding    Colon polyps     PCP:  Karie Schwalbe, MD  REFERRING PROVIDER: Doralee Albino. Carola Frost, MD  REFERRING DIAG: right knee arthritis post op removal of hardware plus scope  THERAPY DIAG:  Chronic pain of right knee  Stiffness of right knee, not elsewhere classified  Muscle weakness (generalized)  Difficulty in walking, not elsewhere classified  Rationale for Evaluation and Treatment: Rehabilitation  ONSET DATE: Original knee injury 08/2022. ORIF 08/28/2022. Hardware removal 06/22/2023.   PERTINENT HISTORY: Patient is a 57 y.o. male who presents to outpatient physical therapy with a referral for medical diagnosis right knee arthritis post op removal of hardware plus scope. This patient's chief complaints consist of right knee pain, instability, stiffness and weakness leading to the following functional deficits: difficulty with completing weight bearing activities without falling, bathing, walking, stairs, transfers, sleeping, dressing, driving. Relevant past medical history and comorbidities include s/p hardware removal and arthroscopy of R knee on 06/22/2023 (hardware originally for distal medial condyle fracture);  s/p L5-S1 ANTERIOR LUMBAR FUSION on  03/06/2023; Diverticulosis large intestine w/o perforation or abscess w/bleeding; Essential hypertension; Rectal bleeding; Hx of adenomatous colonic polyps; Personal hx of gastric bypass; Erectile dysfunction; Sleep disturbance; Lumbar spondylolysis; Microcytic anemia; Iron deficiency anemia; Numbness and tingling; Imbalance; Difficulty sleeping; Disability of walking; Pain in joint involving ankle and foot; Degeneration of lumbar intervertebral disc; Erosive esophagitis; Mood disorder (HCC); Displaced supracondylar fracture of distal end of right femur without intracondylar extension (HCC); Anxiety; Vitamin D deficiency; Metabolic bone  disease; and Radiculopathy, lumbar region. Please see above for relevant surgery history; osteoporosis, bowel/urinary incontinence, stumbling and dropping things. Patient denies hx of cancer, stroke, seizures, lung problems, heart problems, diabetes, and unexplained weight loss.  SUBJECTIVE:   SUBJECTIVE STATEMENT: Patient arrives with SPC in R UE. He states he fell on Wednesday when he caught his right foot on the tub when getting out and hit his right ribs on the side of the tub. He was able to break his fall with his hand. This fall increased his pain in his R knee and low back but he is recovering from that. He had quad soreness after last PT session. He slacked off on his HEP because his ribs were hurting and he did not climb the stairs to his man cave. He states his R knee joint was sore for a couple of days after last PT session but he put ice and voltaren gel on it to help.   PAIN:  NPRS: 6/10 in his right knee and 7/10 low back.   PRECAUTIONS: fall, back precautions since lumbar surgery (no lifting over 5#, repetitive lumbar bending/stooping, any repetitive lumbar motions) Dr. Carola Frost who provided a new PT order that states the following:  closed chain quad strengthening leg press elliptical  home program    PATIENT GOALS: "hopefully to make some improvement, especially with the pain"  NEXT MD VISIT: 08/09/2023  OBJECTIVE  TODAY'S TREATMENT:   Therapeutic exercise: to centralize symptoms and improve ROM, strength, muscular endurance, and activity tolerance required for successful completion of functional activities.  - NuStep level 4 using bilateral upper and lower extremities. Seat/handle setting 12/13. For improved extremity mobility, muscular endurance, and activity tolerance; and to induce the analgesic effect of aerobic exercise, stimulate improved joint nutrition, and prepare body structures and systems for following interventions. x 7  minutes. Average SPM = 84. - Squats with  BUE support focusing on glute activation and proper form with hip hinging, stabilized back, and tibial perpendicular to floor with knees behind toes. 3x10.  - seated leg press at [facility] machine, pillow behind back, seat position 3,  bilateral LE 1x10 at 95#, single leg 4x10 at R at 65/75/75/75# and 3x10 L at 95#. (RPE rated 7/10 bilaterally) - single leg balance, 4x30 seconds on R LE with hands hovering over TM bar with touchdown UE support as needed, 3x30-60 seconds on L LE with basketball passes around body.  - heel raises with B UE support, 1x20  Pt required multimodal cuing for proper technique and to facilitate improved neuromuscular control, strength, range of motion, and functional ability resulting in improved performance and form.  PATIENT EDUCATION:  Education details: Exercise purpose/form. Self management techniques. Education on diagnosis, prognosis, POC, anatomy and physiology of current condition Education on HEP including handout  Person educated: Patient Education method: Explanation, Demonstration, Tactile cues, Verbal cues, and Handouts Education comprehension: verbalized understanding, returned demonstration, and needs further education  HOME EXERCISE PROGRAM: Access Code: [email or web address] URL: [email or web address] Date: 08/15/2023 Prepared by: Norton Blizzard  Exercises -  Supine Single Knee to Chest Stretch  - 1-2 x daily - 2 sets - 10 reps - 5 seconds hold - Standing Terminal Knee Extension with Resistance  - 1 x daily - 2 sets - 10 reps - 5 seconds hold - Single Leg Stance  - 1 x daily - 3 sets - 30 secoonds hold - Squat with Counter Support  - 3-5 x weekly - 3 sets - 10 reps - Heel Raise  - 3-5 x weekly - 1-2 sets - 20 reps  ASSESSMENT:  CLINICAL IMPRESSION: Patient arrives reporting fall when getting out of the tub that caused pain at the right ribs. Today's session continued with focus on quad and LE strengthening with progression to single leg stance. Patient  continues demonstrate motivation challenge himself despite pain. He continues to experience giving way at the right LE which puts him at risk for falls and necessitates continued use of SPC. Patient would benefit from continued management of limiting condition by skilled physical therapist to address remaining impairments and functional limitations to work towards stated goals and return to PLOF or maximal functional independence.   From initial PT Eval 07/10/2023:  Patient is a 57 y.o. male referred to outpatient physical therapy with a medical diagnosis of right knee arthritis post op removal of hardware plus scope who presents with signs and symptoms consistent with right knee pain, stiffness, and weakness s/p hardware removal and arthroscopy on 06/22/2023. Patient presents with significant pain, posture, ROM, joint stiffness, tissue tension, tissue integrity, motor control, muscle performance (strength/power/endurance), gait, balance, and activity tolerance impairments that are limiting ability to complete usual activities including completing weight bearing activities without falling, bathing, walking, stairs, transfers, sleeping, dressing, driving without difficulty. Patient will benefit from skilled physical therapy intervention to address current body structure impairments and activity limitations to improve function and work towards goals set in current POC in order to return to prior level of function or maximal functional improvement.   OBJECTIVE IMPAIRMENTS: Abnormal gait, decreased activity tolerance, decreased balance, decreased endurance, decreased knowledge of condition, decreased mobility, difficulty walking, decreased ROM, decreased strength, hypomobility, increased edema, increased fascial restrictions, impaired perceived functional ability, improper body mechanics, postural dysfunction, and pain.   ACTIVITY LIMITATIONS: carrying, lifting, bending, standing, squatting, sleeping, stairs,  transfers, bed mobility, bathing, dressing, hygiene/grooming, and locomotion level  PARTICIPATION LIMITATIONS: interpersonal relationship, driving, shopping, community activity, occupation, and yard work  PERSONAL FACTORS: Fitness, Past/current experiences, Time since onset of injury/illness/exacerbation, and 3+ comorbidities:   s/p hardware removal and arthroscopy of R knee on 06/22/2023 (hardware originally for distal medial condyle fracture);  s/p L5-S1 ANTERIOR LUMBAR FUSION on 03/06/2023; Diverticulosis large intestine w/o perforation or abscess w/bleeding; Essential hypertension; Rectal bleeding; Hx of adenomatous colonic polyps; Personal hx of gastric bypass; Erectile dysfunction; Sleep disturbance; Lumbar spondylolysis; Microcytic anemia; Iron deficiency anemia; Numbness and tingling; Imbalance; Difficulty sleeping; Disability of walking; Pain in joint involving ankle and foot; Degeneration of lumbar intervertebral disc; Erosive esophagitis; Mood disorder (HCC); Displaced supracondylar fracture of distal end of right femur without intracondylar extension (HCC); Anxiety; Vitamin D deficiency; Metabolic bone disease; and Radiculopathy, lumbar region. Please see above for relevant surgery history; osteoporosis, bowel/urinary incontinence, stumbling and dropping things are also affecting patient's functional outcome.   REHAB POTENTIAL: Good  CLINICAL DECISION MAKING: Evolving/moderate complexity  EVALUATION COMPLEXITY: Moderate   GOALS: Goals reviewed with patient? No  SHORT TERM GOALS: Target date: 07/24/2023  Patient will be independent with initial home exercise program for self-management of symptoms.  Baseline: Initial HEP provided at IE (07/10/23); Goal status: In-progress   LONG TERM GOALS: Target date: 10/02/2023  Patient will be independent with a long-term home exercise program for self-management of symptoms.  Baseline: Initial HEP provided at IE (07/10/23); Goal status:  In-progress  2.  Patient will demonstrate improved FOTO to equal or greater than 30 by visit #10 to demonstrate improvement in overall condition and self-reported functional ability.  Baseline: 15 (07/10/23); 33 at visit #5 (08/01/2023);  Goal status: In-progress  3.  Patient will complete 5 Time Sit to Stand Test in equal or less than 15 seconds from 18.5 inch or less surface without UE support to demonstrate improved LE strength for household and community mobility.  Baseline: 49 seconds from 18.5 inch plinth with heavy B UE support and compensation with lack of R knee flexion and weight bearing mostly on L LE.  (07/10/23); Goal status: In-progress  4.  Patient will ambulate equal or greater than 1000 feet with LRAD on 6 Minute Walk Test to improve his community mobility.  Baseline: to be tested visit 2 as appropriate (07/10/23); 772 feet with SPC with pain up to 8/10 in right leg and feeling heavy (07/17/2023);  Goal status: In-progress  5.  Patient will demonstrate R knee extension strength within 80% of L knee extension strength to improve his ability to ascend and descend stairs.   Baseline: L knee extension strength to be tested visit 2 as appropriate (07/10/23); Goal status: In-progress  6.  Patient will demonstrate R knee PROM equal or greater to 0-125 degrees to improve his ability to ascend and descend and walk community distances.  Baseline: 9-0-126 (07/10/2023);  Goal status: In-progress    PLAN:  PT FREQUENCY: 1-2x/week  PT DURATION: 12 weeks  PLANNED INTERVENTIONS: Therapeutic exercises, Therapeutic activity, Neuromuscular re-education, Balance training, Gait training, Patient/Family education, Self Care, Joint mobilization, Stair training, DME instructions, Aquatic Therapy, Dry Needling, Electrical stimulation, Spinal mobilization, Cryotherapy, Moist heat, Manual therapy, and Re-evaluation  PLAN FOR NEXT SESSION: Progressive LE/functional strengthening and ROM to  especially focus on R knee extension ROM  and quad strength as tolerated. Education. Balance and proprioception exercises as needed. Manual therapy as needed.    Cira Rue, PT, DPT 08/15/2023, 1:48 PM  [facility] [address] [place], [place] [address] P: [phone] I F: [phone]

## 2023-08-17 ENCOUNTER — Ambulatory Visit: Payer: 59 | Admitting: Physical Therapy

## 2023-08-22 ENCOUNTER — Ambulatory Visit (INDEPENDENT_AMBULATORY_CARE_PROVIDER_SITE_OTHER): Payer: 59 | Admitting: Orthopaedic Surgery

## 2023-08-22 ENCOUNTER — Ambulatory Visit: Payer: 59 | Admitting: Physical Therapy

## 2023-08-22 DIAGNOSIS — M25561 Pain in right knee: Secondary | ICD-10-CM | POA: Diagnosis not present

## 2023-08-22 DIAGNOSIS — M1731 Unilateral post-traumatic osteoarthritis, right knee: Secondary | ICD-10-CM | POA: Insufficient documentation

## 2023-08-22 DIAGNOSIS — M25661 Stiffness of right knee, not elsewhere classified: Secondary | ICD-10-CM | POA: Diagnosis not present

## 2023-08-22 DIAGNOSIS — R262 Difficulty in walking, not elsewhere classified: Secondary | ICD-10-CM

## 2023-08-22 DIAGNOSIS — M6281 Muscle weakness (generalized): Secondary | ICD-10-CM

## 2023-08-22 DIAGNOSIS — G8929 Other chronic pain: Secondary | ICD-10-CM

## 2023-08-22 NOTE — Therapy (Signed)
OUTPATIENT PHYSICAL THERAPY TREATMENT / DISCHARGE SUMMARY Dates of reporting 07/10/2023 to 08/22/2023   Patient Name: Harold Smith MRN: [id number] DOB:02/18/1966, 57 y.o., male Today's Date: 08/22/2023  END OF SESSION:  PT End of Session - 08/22/23 1319     Visit Number 10    Number of Visits 13    Date for PT Re-Evaluation 10/02/23    Authorization Type [employer] 2024 reporting period from 07/10/2023    Authorization Time Period VL: 30 combined PT/OT with [employer]/based on auth from [employer]    Authorization - Visit Number 14    Authorization - Number of Visits 30    Progress Note Due on Visit 10    PT Start Time 1305    PT Stop Time 1345    PT Time Calculation (min) 40 min    Activity Tolerance Patient limited by pain    Behavior During Therapy [facility] for tasks assessed/performed                  Past Medical History:  Diagnosis Date   Allergic rhinitis due to pollen    Anemia    Anxiety    Chronic pain    Colon polyps    Depression    Diverticulitis large intestine    GERD (gastroesophageal reflux disease)    HTN (hypertension)    Irritable bowel syndrome    Metabolic bone disease 09/01/2022   Obesity    Osteoarthritis, knee    Sleep apnea    does not use cpap, pt states he no longer needed CPAP due to weight loss   Sleep disturbance    Vitamin D deficiency 09/01/2022   Past Surgical History:  Procedure Laterality Date   ABDOMINAL EXPOSURE N/A 03/06/2023   Procedure: ABDOMINAL EXPOSURE;  Surgeon: Cephus Shelling, MD;  Location: [facility] OR;  Service: Vascular;  Laterality: N/A;   ACHILLES TENDON REPAIR Right 12/26/2006   ANTERIOR LUMBAR FUSION N/A 03/06/2023   Procedure: L5-S1 ANTERIOR LUMBAR FUSION 1 LEVEL;  Surgeon: London Sheer, MD;  Location: [facility] OR;  Service: Orthopedics;  Laterality: N/A;   CATARACT EXTRACTION W/PHACO Left 07/20/2021   Procedure: CATARACT EXTRACTION PHACO AND INTRAOCULAR LENS PLACEMENT (IOC) LEFT 2.11 00:24.0;   Surgeon: Galen Manila, MD;  Location: [facility];  Service: Ophthalmology;  Laterality: Left;  sleep apnea   CATARACT EXTRACTION W/PHACO Right 08/03/2021   Procedure: CATARACT EXTRACTION PHACO AND INTRAOCULAR LENS PLACEMENT (IOC) RIGHT;  Surgeon: Galen Manila, MD;  Location: [facility];  Service: Ophthalmology;  Laterality: Right;  3.19 0:33.0   CHOLECYSTECTOMY     COLON RESECTION Left 12/26/2004   due to diverticular disease at [facility]   COLONOSCOPY  2013?   COLONOSCOPY N/A 11/30/2022   Procedure: COLONOSCOPY;  Surgeon: Toney Reil, MD;  Location: [facility] ENDOSCOPY;  Service: Gastroenterology;  Laterality: N/A;   COLONOSCOPY WITH PROPOFOL N/A 05/22/2018   Procedure: COLONOSCOPY WITH PROPOFOL;  Surgeon: Toney Reil, MD;  Location: [facility] ENDOSCOPY;  Service: Gastroenterology;  Laterality: N/A;   COLONOSCOPY WITH PROPOFOL N/A 10/22/2020   Procedure: COLONOSCOPY WITH PROPOFOL;  Surgeon: Toney Reil, MD;  Location: [facility];  Service: Endoscopy;  Laterality: N/A;  priority 4   COLONOSCOPY WITH PROPOFOL N/A 11/10/2021   Procedure: COLONOSCOPY WITH PROPOFOL;  Surgeon: Toney Reil, MD;  Location: [facility] ENDOSCOPY;  Service: Gastroenterology;  Laterality: N/A;   COLONOSCOPY WITH PROPOFOL N/A 06/16/2022   Procedure: COLONOSCOPY WITH PROPOFOL;  Surgeon: Toney Reil, MD;  Location: [facility]  SURGERY CNTR;  Service: Endoscopy;  Laterality: N/A;   COLONOSCOPY WITH PROPOFOL N/A 11/29/2022   Procedure: COLONOSCOPY WITH PROPOFOL;  Surgeon: Toney Reil, MD;  Location: [facility] ENDOSCOPY;  Service: Gastroenterology;  Laterality: N/A;   DRUG INDUCED ENDOSCOPY N/A 06/10/2019   Procedure: DRUG INDUCED SLEEP ENDOSCOPY;  Surgeon: Osborn Coho, MD;  Location: [facility];  Service: ENT;  Laterality: N/A;   ESOPHAGOGASTRODUODENOSCOPY (EGD) WITH PROPOFOL N/A 06/16/2022   Procedure: ESOPHAGOGASTRODUODENOSCOPY (EGD) WITH PROPOFOL;   Surgeon: Toney Reil, MD;  Location: [facility];  Service: Endoscopy;  Laterality: N/A;   ESOPHAGOGASTRODUODENOSCOPY (EGD) WITH PROPOFOL N/A 05/24/2023   Procedure: ESOPHAGOGASTRODUODENOSCOPY (EGD) WITH PROPOFOL;  Surgeon: Toney Reil, MD;  Location: [facility] ENDOSCOPY;  Service: Gastroenterology;  Laterality: N/A;   GASTRIC BYPASS N/A 12/27/2007   HARDWARE REMOVAL Right 06/22/2023   Procedure: HARDWARE REMOVAL;  Surgeon: Myrene Galas, MD;  Location: [facility] OR;  Service: Orthopedics;  Laterality: Right;   HIATAL HERNIA REPAIR  09/25/2014   at [facility]   KNEE ARTHROSCOPY Right 06/22/2023   Procedure: ARTHROSCOPY KNEE;  Surgeon: Myrene Galas, MD;  Location: [facility] OR;  Service: Orthopedics;  Laterality: Right;   ORIF FEMUR FRACTURE Right 08/30/2022   Procedure: OPEN REDUCTION INTERNAL FIXATION (ORIF) DISTAL FEMUR FRACTURE;  Surgeon: Myrene Galas, MD;  Location: [facility] OR;  Service: Orthopedics;  Laterality: Right;   POLYPECTOMY  10/22/2020   Procedure: POLYPECTOMY;  Surgeon: Toney Reil, MD;  Location: [facility];  Service: Endoscopy;;   POLYPECTOMY  06/16/2022   Procedure: POLYPECTOMY;  Surgeon: Toney Reil, MD;  Location: [facility];  Service: Endoscopy;;   ROTATOR CUFF REPAIR Right    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   Patient Active Problem List   Diagnosis Date Noted   Post-traumatic osteoarthritis of right knee 08/22/2023   Chronic pain syndrome 07/12/2023   Anxiety 09/01/2022   Vitamin D deficiency 09/01/2022   Metabolic bone disease  09/01/2022   Displaced supracondylar fracture of distal end of right femur without intracondylar extension (HCC) 08/29/2022   Mood disorder (HCC) 07/08/2022   Erosive esophagitis    Disability of walking 06/01/2022   Degeneration of lumbar intervertebral disc 06/01/2022   Iron deficiency anemia 05/10/2022   Lumbar spondylolysis 02/25/2022   Sleep disturbance 07/02/2021   Preventative health  care 06/23/2018   Personal hx of gastric bypass 06/23/2018   Erectile dysfunction 06/23/2018   Hx of adenomatous colonic polyps    Essential hypertension 10/17/2013   Diverticulosis large intestine w/o perforation or abscess w/bleeding    Colon polyps     PCP:  Karie Schwalbe, MD  REFERRING PROVIDER: Doralee Albino. Carola Frost, MD  REFERRING DIAG: right knee arthritis post op removal of hardware plus scope  THERAPY DIAG:  Chronic pain of right knee  Stiffness of right knee, not elsewhere classified  Muscle weakness (generalized)  Difficulty in walking, not elsewhere classified  Rationale for Evaluation and Treatment: Rehabilitation  ONSET DATE: Original knee injury 08/2022. ORIF 08/28/2022. Hardware removal 06/22/2023.   PERTINENT HISTORY: Patient is a 57 y.o. male who presents to outpatient physical therapy with a referral for medical diagnosis right knee arthritis post op removal of hardware plus scope. This patient's chief complaints consist of right knee pain, instability, stiffness and weakness leading to the following functional deficits: difficulty with completing weight bearing activities without falling, bathing, walking, stairs, transfers, sleeping, dressing, driving. Relevant past medical history and comorbidities include s/p hardware removal and arthroscopy of  R knee on 06/22/2023 (hardware originally for distal medial condyle fracture);  s/p L5-S1 ANTERIOR LUMBAR FUSION on 03/06/2023; Diverticulosis large intestine w/o perforation or abscess w/bleeding; Essential hypertension; Rectal bleeding; Hx of adenomatous colonic polyps; Personal hx of gastric bypass; Erectile dysfunction; Sleep disturbance; Lumbar spondylolysis; Microcytic anemia; Iron deficiency anemia; Numbness and tingling; Imbalance; Difficulty sleeping; Disability of walking; Pain in joint involving ankle and foot; Degeneration of lumbar intervertebral disc; Erosive esophagitis; Mood disorder (HCC); Displaced supracondylar  fracture of distal end of right femur without intracondylar extension (HCC); Anxiety; Vitamin D deficiency; Metabolic bone disease; and Radiculopathy, lumbar region. Please see above for relevant surgery history; osteoporosis, bowel/urinary incontinence, stumbling and dropping things. Patient denies hx of cancer, stroke, seizures, lung problems, heart problems, diabetes, and unexplained weight loss.  SUBJECTIVE:   SUBJECTIVE STATEMENT: Patient arrives with SPC in R UE. He states he is pretty sore and tired today after being up on his feet a lot more than he expected while working on getting his financial and medical tasks done. He saw Dr. Magnus Ivan this morning about his right knee, and he recommended TKA to be scheduled in October. Patient wanted to know PT's advice on discharging from PT today to preserve visits that are limited by insurance for use after surgery. He states he was sore but okay after last PT session. He denies falls since last PT session. He states his HEP is going okay. He understands the importance of increasing strength and maintaining ROM prior to surgery. His back continues to bother him.   PAIN:  NPRS: 6/10 in his right knee and 7-8/10 low back.   PRECAUTIONS: fall, back precautions since lumbar surgery (no lifting over 5#, repetitive lumbar bending/stooping, any repetitive lumbar motions) Dr. Carola Frost who provided a new PT order that states the following:  closed chain quad strengthening leg press elliptical  home program    PATIENT GOALS: "hopefully to make some improvement, especially with the pain"  NEXT MD VISIT: 08/09/2023  OBJECTIVE  SELF-REPORTED FUNCTION FOTO score: 29/100 (knee questionnaire)  PERIPHERAL JOINT MOTION (in degrees) PASSIVE RANGE OF MOTION (PROM) *Indicates pain 07/10/23 08/22/23 Date  Joint/Motion R/L R/L R/L  Knee        Extension -9*/-7 0*/ /  Flexion 126*/151* 132*/ /  Comments:  07/10/2023: B hip flexion, IR/ER at 90 degrees flexion  grossly [facility] for basic mobility. Hip extension not tested today.  B ankle ROM grossly [facility] for basic mobility.  08/22/2023: pain at back of R knee during flexion, pain at front of R knee during extension.   MUSCLE PERFORMANCE Knee extension strength:  leg press at seat position 3, 10 RM R = 75# for 11 reps (calculated 10 RM = 76.9#) L = 105# for 10 reps LSI = 73%  FUNCTIONAL TESTS 6 Minute Walk Test: 812 feet with SPC in R UE. Pain up to 7/10 in the right knee and right LE feeling "heavy"  5 Times Sit to Stand Test: 38 seconds with hands on knees, no unsteadiness, initially limited by back pain but it was increased right knee pain that limited him by end of set (after 3 reps).   TODAY'S TREATMENT:   Therapeutic exercise: to centralize symptoms and improve ROM, strength, muscular endurance, and activity tolerance required for successful completion of functional activities.  - measurements to assess progress - see above - hooklyiing bridges, various renditions including with hip abduction against GTB around knees, heels up, timed with breath, and with shoulder flexion. Too uncomfortable on low  back. 10-15 reps total completed.  - seated leg press at [facility] machine, seat position 3,  bilateral LE 1x10 at 95#.  - seated leg press at [facility] machine, seat position 3, one set max reps at selected weight to determine 10RM (see above).  - standing diagonal hip abduction/extension with B UE support, 1x10 each side AROM, 1x10 each side with YTB around ankles.  - Education on HEP including handout  - discharge advice   Pt required multimodal cuing for proper technique and to facilitate improved neuromuscular control, strength, range of motion, and functional ability resulting in improved performance and form.  PATIENT EDUCATION:  Education details: Exercise purpose/form. Self management techniques. Education on diagnosis, prognosis, POC, anatomy and physiology of current condition Education on HEP  including handout  Person educated: Patient Education method: Explanation, Demonstration, Tactile cues, Verbal cues, and Handouts Education comprehension: verbalized understanding and returned demonstration  HOME EXERCISE PROGRAM: Access Code: [email or web address] URL: [email or web address] Date: 08/22/2023 Prepared by: Norton Blizzard  Exercises - Supine Single Knee to Chest Stretch  - 1-2 x daily - 2 sets - 10 reps - 5 seconds hold - Standing Terminal Knee Extension with Resistance  - 1 x daily - 2 sets - 10 reps - 5 seconds hold - Single Leg Stance  - 1 x daily - 3 sets - 30 secoonds hold - Squat with Counter Support  - 3-5 x weekly - 3 sets - 10 reps - Heel Raise  - 3-5 x weekly - 1-2 sets - 20 reps - Diagonal Hip Extension with Resistance  - 1 x daily - 3-5 x weekly - 3 sets - 10 reps - 1 second hold  ASSESSMENT:  CLINICAL IMPRESSION: Patient has attended 10 physical therapy sessions since starting current episode of care on 07/10/2023. He is 8 weeks, 5 days s/p R knee hardware removal and arthroscopy on 06/22/2023. He has been recommended to get a R TKA with plan for it to be in October 2024. Given the importance of high quality rehab after a TKA and patient's insurance limit of 30 PT visits per year, of which he has used 14 at this point, patient is discharging from PT to long term HEP to continue independent management of condition with focus on improving strength and maintaining or improving R knee ROM. Patient demonstrates progress towards all of his goals and appears to have good motivation to continue HEP at home to prepare for upcoming surgery. He continues to have significant pain, strength, balance, and mobility deficits that would benefit from PT. Plan to resume formal PT after TKA.   From initial PT Eval 07/10/2023:  Patient is a 57 y.o. male referred to outpatient physical therapy with a medical diagnosis of right knee arthritis post op removal of hardware plus scope who  presents with signs and symptoms consistent with right knee pain, stiffness, and weakness s/p hardware removal and arthroscopy on 06/22/2023. Patient presents with significant pain, posture, ROM, joint stiffness, tissue tension, tissue integrity, motor control, muscle performance (strength/power/endurance), gait, balance, and activity tolerance impairments that are limiting ability to complete usual activities including completing weight bearing activities without falling, bathing, walking, stairs, transfers, sleeping, dressing, driving without difficulty. Patient will benefit from skilled physical therapy intervention to address current body structure impairments and activity limitations to improve function and work towards goals set in current POC in order to return to prior level of function or maximal functional improvement.   OBJECTIVE IMPAIRMENTS: Abnormal gait, decreased activity tolerance, decreased balance, decreased endurance,  decreased knowledge of condition, decreased mobility, difficulty walking, decreased ROM, decreased strength, hypomobility, increased edema, increased fascial restrictions, impaired perceived functional ability, improper body mechanics, postural dysfunction, and pain.   ACTIVITY LIMITATIONS: carrying, lifting, bending, standing, squatting, sleeping, stairs, transfers, bed mobility, bathing, dressing, hygiene/grooming, and locomotion level  PARTICIPATION LIMITATIONS: interpersonal relationship, driving, shopping, community activity, occupation, and yard work  PERSONAL FACTORS: Fitness, Past/current experiences, Time since onset of injury/illness/exacerbation, and 3+ comorbidities:   s/p hardware removal and arthroscopy of R knee on 06/22/2023 (hardware originally for distal medial condyle fracture);  s/p L5-S1 ANTERIOR LUMBAR FUSION on 03/06/2023; Diverticulosis large intestine w/o perforation or abscess w/bleeding; Essential hypertension; Rectal bleeding; Hx of adenomatous  colonic polyps; Personal hx of gastric bypass; Erectile dysfunction; Sleep disturbance; Lumbar spondylolysis; Microcytic anemia; Iron deficiency anemia; Numbness and tingling; Imbalance; Difficulty sleeping; Disability of walking; Pain in joint involving ankle and foot; Degeneration of lumbar intervertebral disc; Erosive esophagitis; Mood disorder (HCC); Displaced supracondylar fracture of distal end of right femur without intracondylar extension (HCC); Anxiety; Vitamin D deficiency; Metabolic bone disease; and Radiculopathy, lumbar region. Please see above for relevant surgery history; osteoporosis, bowel/urinary incontinence, stumbling and dropping things are also affecting patient's functional outcome.   REHAB POTENTIAL: Good  CLINICAL DECISION MAKING: Evolving/moderate complexity  EVALUATION COMPLEXITY: Moderate   GOALS: Goals reviewed with patient? No  SHORT TERM GOALS: Target date: 07/24/2023  Patient will be independent with initial home exercise program for self-management of symptoms. Baseline: Initial HEP provided at IE (07/10/23); Goal status: MET   LONG TERM GOALS: Target date: 10/02/2023  Patient will be independent with a long-term home exercise program for self-management of symptoms.  Baseline: Initial HEP provided at IE (07/10/23); participating well (08/22/2023);  Goal status: MET  2.  Patient will demonstrate improved FOTO to equal or greater than 30 by visit #10 to demonstrate improvement in overall condition and self-reported functional ability.  Baseline: 15 (07/10/23); 33 at visit #5 (08/01/2023); 29 at visit #10 (08/22/2023);  Goal status: Nearly MET  3.  Patient will complete 5 Time Sit to Stand Test in equal or less than 15 seconds from 18.5 inch or less surface without UE support to demonstrate improved LE strength for household and community mobility.  Baseline: 49 seconds from 18.5 inch plinth with heavy B UE support and compensation with lack of R knee flexion  and weight bearing mostly on L LE.  (07/10/23); 38 seconds with hands on knees, no unsteadiness, initially limited by back pain but it was increased right knee pain that limited him by end of set (after 3 reps) (08/22/2023);  Goal status: Partially MET  4.  Patient will ambulate equal or greater than 1000 feet with LRAD on 6 Minute Walk Test to improve his community mobility.  Baseline: to be tested visit 2 as appropriate (07/10/23); 772 feet with SPC with pain up to 8/10 in right leg and feeling heavy (07/17/2023); 812 feet with SPC in R UE pain up to 7/10 in the right knee (08/22/2023);  Goal status: Partially MET  5.  Patient will demonstrate R knee extension strength within 80% of L knee extension strength to improve his ability to ascend and descend stairs.   Baseline: L knee extension strength to be tested visit 2 as appropriate (07/10/23); LSI = 73% for leg press at seat position 3 (08/22/2023): Goal status: Partially MET  6.  Patient will demonstrate R knee PROM equal or greater to 0-125 degrees to improve his ability to ascend and descend  and walk community distances.  Baseline: 9-0-126 (07/10/2023); 0-132 (08/22/2023);  Goal status: MET    PLAN:  PT FREQUENCY: 1-2x/week  PT DURATION: 12 weeks  PLANNED INTERVENTIONS: Therapeutic exercises, Therapeutic activity, Neuromuscular re-education, Balance training, Gait training, Patient/Family education, Self Care, Joint mobilization, Stair training, DME instructions, Aquatic Therapy, Dry Needling, Electrical stimulation, Spinal mobilization, Cryotherapy, Moist heat, Manual therapy, and Re-evaluation  PLAN FOR NEXT SESSION: Patient is now discharged to long term HEP until after his upcoming TKA.    Cira Rue, PT, DPT 08/22/2023, 3:13 PM  [facility] [address] [place], [place] [address] P: [phone] I F: [phone]

## 2023-08-22 NOTE — Progress Notes (Signed)
The patient is a 57 year old gentleman that I am seeing for the first time.  He is actually been operated on by my partner Dr. Christell Constant for his lumbar spine.  He was sent to me from Dr. Carola Frost to evaluate and treat posttraumatic arthritis of his right knee.  He unfortunately sustained a hard mechanical fall and suffered a distal femur fracture in September of last year.  He has had all the hardware removed from his knee at this standpoint and had a right knee arthroscopic intervention showing significant arthritis in that knee.  He is sent to me to consider knee replacement surgery for that right knee.  His right knee pain is daily and it is severe.  It is 10 out of 10.  He walks with a significant limp and does use an assistive device to walk with.  We did go over his x-rays with him and he does have significant posttraumatic arthritis of the right knee.  Exam there is varus malalignment of the right knee.  There is significant swelling with that knee.  He has good range of motion of the knee but pain throughout the arc of motion of that right knee.  His left knee exam is entirely normal.  We had a long and thorough discussion about knee replacement surgery.  I do think this is medically necessary at this point given amount of pain he is having combined with his clinical exam and radiographic findings.  We talked in length in detail about the surgery including what to expect from an intraoperative and postoperative standpoint.  We discussed the risks and benefits of surgery in detail and I went over knee replacement model and his x-rays with him.  All questions and concerns were answered and addressed.  I was able to review all of his medicines and past medical history within epic as well.  We will work on getting him scheduled soon for a right total knee arthroplasty.  All questions and concerns were answered and addressed.

## 2023-08-24 ENCOUNTER — Encounter: Payer: 59 | Admitting: Physical Therapy

## 2023-08-27 DIAGNOSIS — Z419 Encounter for procedure for purposes other than remedying health state, unspecified: Secondary | ICD-10-CM | POA: Diagnosis not present

## 2023-08-29 ENCOUNTER — Encounter: Payer: Self-pay | Admitting: Gastroenterology

## 2023-08-29 ENCOUNTER — Ambulatory Visit: Payer: 59 | Admitting: Anesthesiology

## 2023-08-29 ENCOUNTER — Ambulatory Visit
Admission: RE | Admit: 2023-08-29 | Discharge: 2023-08-29 | Disposition: A | Discharge status: Home / Self Care | Payer: 59 | Source: Ambulatory Visit | Attending: Gastroenterology | Admitting: Gastroenterology

## 2023-08-29 ENCOUNTER — Encounter: Payer: 59 | Admitting: Physical Therapy

## 2023-08-29 ENCOUNTER — Encounter
Admission: RE | Disposition: A | Discharge status: Home / Self Care | Payer: Self-pay | Source: Ambulatory Visit | Attending: Gastroenterology

## 2023-08-29 DIAGNOSIS — F32A Depression, unspecified: Secondary | ICD-10-CM | POA: Diagnosis not present

## 2023-08-29 DIAGNOSIS — D759 Disease of blood and blood-forming organs, unspecified: Secondary | ICD-10-CM | POA: Diagnosis not present

## 2023-08-29 DIAGNOSIS — G4730 Sleep apnea, unspecified: Secondary | ICD-10-CM | POA: Diagnosis not present

## 2023-08-29 DIAGNOSIS — K2289 Other specified disease of esophagus: Secondary | ICD-10-CM | POA: Insufficient documentation

## 2023-08-29 DIAGNOSIS — D649 Anemia, unspecified: Secondary | ICD-10-CM | POA: Insufficient documentation

## 2023-08-29 DIAGNOSIS — K2100 Gastro-esophageal reflux disease with esophagitis, without bleeding: Secondary | ICD-10-CM | POA: Insufficient documentation

## 2023-08-29 DIAGNOSIS — K208 Other esophagitis without bleeding: Secondary | ICD-10-CM

## 2023-08-29 DIAGNOSIS — I10 Essential (primary) hypertension: Secondary | ICD-10-CM | POA: Insufficient documentation

## 2023-08-29 DIAGNOSIS — K3189 Other diseases of stomach and duodenum: Secondary | ICD-10-CM

## 2023-08-29 DIAGNOSIS — F17200 Nicotine dependence, unspecified, uncomplicated: Secondary | ICD-10-CM | POA: Diagnosis not present

## 2023-08-29 DIAGNOSIS — F419 Anxiety disorder, unspecified: Secondary | ICD-10-CM | POA: Diagnosis not present

## 2023-08-29 DIAGNOSIS — F17290 Nicotine dependence, other tobacco product, uncomplicated: Secondary | ICD-10-CM | POA: Insufficient documentation

## 2023-08-29 DIAGNOSIS — Z980 Intestinal bypass and anastomosis status: Secondary | ICD-10-CM | POA: Diagnosis not present

## 2023-08-29 DIAGNOSIS — Z8711 Personal history of peptic ulcer disease: Secondary | ICD-10-CM | POA: Diagnosis not present

## 2023-08-29 DIAGNOSIS — K2210 Ulcer of esophagus without bleeding: Secondary | ICD-10-CM

## 2023-08-29 HISTORY — PX: ESOPHAGOGASTRODUODENOSCOPY (EGD) WITH PROPOFOL: SHX5813

## 2023-08-29 HISTORY — PX: BIOPSY: SHX5522

## 2023-08-29 SURGERY — ESOPHAGOGASTRODUODENOSCOPY (EGD) WITH PROPOFOL
Anesthesia: General

## 2023-08-29 MED ORDER — DEXMEDETOMIDINE HCL IN NACL 200 MCG/50ML IV SOLN
INTRAVENOUS | Status: DC | PRN
  Administered 2023-08-29: 8 ug via INTRAVENOUS
  Administered 2023-08-29: 12 ug via INTRAVENOUS

## 2023-08-29 MED ORDER — ONDANSETRON HCL 4 MG/2ML IJ SOLN
INTRAMUSCULAR | Status: DC | PRN
  Administered 2023-08-29: 4 mg via INTRAVENOUS

## 2023-08-29 MED ORDER — SODIUM CHLORIDE 0.9 % IV SOLN: INTRAVENOUS | Status: DC | PRN

## 2023-08-29 MED ORDER — LIDOCAINE HCL (CARDIAC) PF 100 MG/5ML IV SOSY
PREFILLED_SYRINGE | INTRAVENOUS | Status: DC | PRN
  Administered 2023-08-29: 100 mg via INTRAVENOUS

## 2023-08-29 MED ORDER — PROPOFOL 500 MG/50ML IV EMUL
INTRAVENOUS | Status: DC | PRN
  Administered 2023-08-29: 30 mg via INTRAVENOUS
  Administered 2023-08-29: 150 ug/kg/min via INTRAVENOUS

## 2023-08-29 MED ORDER — GLYCOPYRROLATE 0.2 MG/ML IJ SOLN
INTRAMUSCULAR | Status: DC | PRN
  Administered 2023-08-29: .2 mg via INTRAVENOUS

## 2023-08-29 MED ORDER — SODIUM CHLORIDE 0.9 % IV SOLN: INTRAVENOUS | Status: DC

## 2023-08-29 NOTE — Op Note (Signed)
[facility] Gastroenterology Patient Name: Harold Smith Procedure Date: 08/29/2023 10:48 AM MRN: [id number] Account #: [id number] Date of Birth: 1966-04-15 Admit Type: Outpatient Age: 57 Room: [facility] ENDO ROOM 3 Gender: Male Note Status: Finalized Instrument Name: Upper Endoscope [id number] Procedure:             Upper GI endoscopy Indications:           Follow-up of reflux esophagitis Providers:             Toney Reil MD, MD Referring MD:          Karie Schwalbe (Referring MD) Medicines:             General Anesthesia Complications:         No immediate complications. Estimated blood loss: None. Procedure:             Pre-Anesthesia Assessment:                        - Prior to the procedure, a History and Physical was                         performed, and patient medications and allergies were                         reviewed. The patient is competent. The risks and                         benefits of the procedure and the sedation options and                         risks were discussed with the patient. All questions                         were answered and informed consent was obtained.                         Patient identification and proposed procedure were                         verified by the physician, the nurse, the                         anesthesiologist, the anesthetist and the technician                         in the pre-procedure area in the procedure room in the                         endoscopy suite. Mental Status Examination: alert and                         oriented. Airway Examination: normal oropharyngeal                         airway and neck mobility. Respiratory Examination:                         clear to auscultation. CV Examination: normal.  Prophylactic Antibiotics: The patient does not require                         prophylactic antibiotics. Prior Anticoagulants: The                          patient has taken no anticoagulant or antiplatelet                         agents. ASA Grade Assessment: III - A patient with                         severe systemic disease. After reviewing the risks and                         benefits, the patient was deemed in satisfactory                         condition to undergo the procedure. The anesthesia                         plan was to use general anesthesia. Immediately prior                         to administration of medications, the patient was                         re-assessed for adequacy to receive sedatives. The                         heart rate, respiratory rate, oxygen saturations,                         blood pressure, adequacy of pulmonary ventilation, and                         response to care were monitored throughout the                         procedure. The physical status of the patient was                         re-assessed after the procedure.                        After obtaining informed consent, the endoscope was                         passed under direct vision. Throughout the procedure,                         the patient's blood pressure, pulse, and oxygen                         saturations were monitored continuously. The Endoscope                         was introduced through the mouth, and advanced to the  afferent and efferent jejunal loops. The upper GI                         endoscopy was accomplished without difficulty. The                         patient tolerated the procedure well. Findings:      Evidence of a Roux-en-Y gastrojejunostomy was found. The gastrojejunal       and duodenojejunal anastomosis was characterized by healthy appearing       mucosa. This was traversed. The pouch-to-jejunum limb was characterized       by healthy appearing mucosa. The jejunojejunal anastomosis was       characterized by healthy appearing mucosa. The duodenum-to-jejunum limb        was examined.      Esophagogastric landmarks were identified: the gastroesophageal junction       was found at 42 cm from the incisors.      The Z-line was irregular and was found 42 cm from the incisors.      The examined esophagus was normal. Biopsies were taken with a cold       forceps for histology.      A single area of ectopic gastric mucosa was found in the upper third of       the esophagus, 20 cm from the incisors. Impression:            - Roux-en-Y gastrojejunostomy with gastrojejunal                         anastomosis characterized by healthy appearing mucosa.                        - Esophagogastric landmarks identified.                        - Z-line irregular, 42 cm from the incisors.                        - Normal esophagus. Biopsied.                        - Ectopic gastric mucosa in the upper third of the                         esophagus. Recommendation:        - Await pathology results.                        - Discharge patient to home (with escort).                        - Resume previous diet today.                        - Continue present medications. Procedure Code(s):     --- Professional ---                        [phone], Esophagogastroduodenoscopy, flexible,                         transoral; with biopsy, single or multiple  Diagnosis Code(s):     --- Professional ---                        Z98.0, Intestinal bypass and anastomosis status                        K22.89, Other specified disease of esophagus                        K21.00, Gastro-esophageal reflux disease with                         esophagitis, without bleeding CPT copyright 2022 American Medical Association. All rights reserved. The codes documented in this report are preliminary and upon coder review may  be revised to meet current compliance requirements. Dr. Libby Maw Toney Reil MD, MD 08/29/2023 11:17:13 AM This report has been signed electronically. Number of Addenda:  0 Note Initiated On: 08/29/2023 10:48 AM Estimated Blood Loss:  Estimated blood loss: none. Estimated blood loss: none.      [facility]

## 2023-08-29 NOTE — Transfer of Care (Signed)
Immediate Anesthesia Transfer of Care Note  Patient: Harold Smith  Procedure(s) Performed: ESOPHAGOGASTRODUODENOSCOPY (EGD) WITH PROPOFOL BIOPSY  Patient Location: PACU  Anesthesia Type:MAC  Level of Consciousness: awake  Airway & Oxygen Therapy: Patient Spontanous Breathing  Post-op Assessment: Report given to RN and Post -op Vital signs reviewed and stable  Post vital signs: Reviewed  Last Vitals:  Vitals Value Taken Time  BP 156/98 08/29/23 1118  Temp 36.3 C 08/29/23 1116  Pulse 92 08/29/23 1118  Resp 20 08/29/23 1118  SpO2 98 % 08/29/23 1118  Vitals shown include unfiled device data.  Last Pain:  Vitals:   08/29/23 1116  TempSrc: Temporal  PainSc: 0-No pain         Complications: No notable events documented.

## 2023-08-29 NOTE — H&P (Signed)
Arlyss Repress, MD [address]  [address]  [place], [place] [address]  Main: [phone]  Fax: [phone] Pager: [phone]  Primary Care Physician:  Karie Schwalbe, MD Primary Gastroenterologist:  Dr. Arlyss Repress  Pre-Procedure History & Physical: HPI:  Harold Smith is a 57 y.o. male is here for an endoscopy.   Past Medical History:  Diagnosis Date   Allergic rhinitis due to pollen    Anemia    Anxiety    Chronic pain    Colon polyps    Depression    Diverticulitis large intestine    GERD (gastroesophageal reflux disease)    HTN (hypertension)    Irritable bowel syndrome    Metabolic bone disease 09/01/2022   Obesity    Osteoarthritis, knee    Sleep apnea    does not use cpap, pt states he no longer needed CPAP due to weight loss   Sleep disturbance    Vitamin D deficiency 09/01/2022    Past Surgical History:  Procedure Laterality Date   ABDOMINAL EXPOSURE N/A 03/06/2023   Procedure: ABDOMINAL EXPOSURE;  Surgeon: Cephus Shelling, MD;  Location: [facility] OR;  Service: Vascular;  Laterality: N/A;   ACHILLES TENDON REPAIR Right 12/26/2006   ANTERIOR LUMBAR FUSION N/A 03/06/2023   Procedure: L5-S1 ANTERIOR LUMBAR FUSION 1 LEVEL;  Surgeon: London Sheer, MD;  Location: [facility] OR;  Service: Orthopedics;  Laterality: N/A;   CATARACT EXTRACTION W/PHACO Left 07/20/2021   Procedure: CATARACT EXTRACTION PHACO AND INTRAOCULAR LENS PLACEMENT (IOC) LEFT 2.11 00:24.0;  Surgeon: Galen Manila, MD;  Location: [facility];  Service: Ophthalmology;  Laterality: Left;  sleep apnea   CATARACT EXTRACTION W/PHACO Right 08/03/2021   Procedure: CATARACT EXTRACTION PHACO AND INTRAOCULAR LENS PLACEMENT (IOC) RIGHT;  Surgeon: Galen Manila, MD;  Location: [facility];  Service: Ophthalmology;  Laterality: Right;  3.19 0:33.0   CHOLECYSTECTOMY     COLON RESECTION Left 12/26/2004   due to diverticular disease at [facility]   COLONOSCOPY  2013?    COLONOSCOPY N/A 11/30/2022   Procedure: COLONOSCOPY;  Surgeon: Toney Reil, MD;  Location: [facility] ENDOSCOPY;  Service: Gastroenterology;  Laterality: N/A;   COLONOSCOPY WITH PROPOFOL N/A 05/22/2018   Procedure: COLONOSCOPY WITH PROPOFOL;  Surgeon: Toney Reil, MD;  Location: [facility] ENDOSCOPY;  Service: Gastroenterology;  Laterality: N/A;   COLONOSCOPY WITH PROPOFOL N/A 10/22/2020   Procedure: COLONOSCOPY WITH PROPOFOL;  Surgeon: Toney Reil, MD;  Location: [facility];  Service: Endoscopy;  Laterality: N/A;  priority 4   COLONOSCOPY WITH PROPOFOL N/A 11/10/2021   Procedure: COLONOSCOPY WITH PROPOFOL;  Surgeon: Toney Reil, MD;  Location: [facility] ENDOSCOPY;  Service: Gastroenterology;  Laterality: N/A;   COLONOSCOPY WITH PROPOFOL N/A 06/16/2022   Procedure: COLONOSCOPY WITH PROPOFOL;  Surgeon: Toney Reil, MD;  Location: [facility];  Service: Endoscopy;  Laterality: N/A;   COLONOSCOPY WITH PROPOFOL N/A 11/29/2022   Procedure: COLONOSCOPY WITH PROPOFOL;  Surgeon: Toney Reil, MD;  Location: [facility] ENDOSCOPY;  Service: Gastroenterology;  Laterality: N/A;   DRUG INDUCED ENDOSCOPY N/A 06/10/2019   Procedure: DRUG INDUCED SLEEP ENDOSCOPY;  Surgeon: Osborn Coho, MD;  Location: [facility];  Service: ENT;  Laterality: N/A;   ESOPHAGOGASTRODUODENOSCOPY (EGD) WITH PROPOFOL N/A 06/16/2022   Procedure: ESOPHAGOGASTRODUODENOSCOPY (EGD) WITH PROPOFOL;  Surgeon: Toney Reil, MD;  Location: [facility];  Service: Endoscopy;  Laterality: N/A;   ESOPHAGOGASTRODUODENOSCOPY (EGD) WITH PROPOFOL N/A 05/24/2023   Procedure: ESOPHAGOGASTRODUODENOSCOPY (EGD) WITH  PROPOFOL;  Surgeon: Toney Reil, MD;  Location: [facility] END[facility]COPY;  Service: Gastroenterology;  Laterality: N/A;   GASTRIC BYPASS N/A 12/27/2007   HARDWARE REMOVAL Right 06/22/2023   Procedure: HARDWARE REMOVAL;  Surgeon: Myrene Galas, MD;  Location: [facility] OR;  Service:  Orthopedics;  Laterality: Right;   HIATAL HERNIA REPAIR  09/25/2014   at [facility]   KNEE ARTHROSCOPY Right 06/22/2023   Procedure: ARTHROSCOPY KNEE;  Surgeon: Myrene Galas, MD;  Location: [facility] OR;  Service: Orthopedics;  Laterality: Right;   ORIF FEMUR FRACTURE Right 08/30/2022   Procedure: OPEN REDUCTION INTERNAL FIXATION (ORIF) DISTAL FEMUR FRACTURE;  Surgeon: Myrene Galas, MD;  Location: [facility] OR;  Service: Orthopedics;  Laterality: Right;   POLYPECTOMY  10/22/2020   Procedure: POLYPECTOMY;  Surgeon: Toney Reil, MD;  Location: [facility] SURGERY [facility];  Service: Endoscopy;;   POLYPECTOMY  06/16/2022   Procedure: POLYPECTOMY;  Surgeon: Toney Reil, MD;  Location: [facility];  Service: Endoscopy;;   ROTATOR CUFF REPAIR Right    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 Authorizing Provider  alendronate (FOSAMAX) 70 MG tablet Take 1 tablet (70 mg total) by mouth once a week. Take with a full glass of water on an empty stomach. 12/30/22   London Sheer, MD  CALCIUM PO Take 1 tablet by mouth daily.    [provider]  cetirizine (ZYRTEC) 10 MG tablet Take 10 mg by mouth daily.    [provider]  Cholecalciferol 125 MCG (5000 UT) TABS Take 1 tablet by mouth daily. Patient taking differently: Take 5,000 Units by mouth daily. 09/01/22   Montez Morita, PA-C  Cyanocobalamin (VITAMIN B-12 PO) Take 1 tablet by mouth daily.    [provider]  cyclobenzaprine (FLEXERIL) 5 MG tablet Take 1-2 tablets (5-10 mg total) by mouth 3 (three) times daily as needed for muscle spasms. 07/12/23   Karie Schwalbe, MD  dicyclomine (BENTYL) 10 MG capsule Take 1 to 2 capsules as needed for abdominal cramps and diarrhea every 6 to 8 hours Patient taking differently: Take 10-20 mg by mouth See admin instructions. Take 1 to 2 capsules as needed for abdominal cramps and diarrhea every 6 to 8 hours  as needed 10/12/22   Toney Reil, MD  DULoxetine (CYMBALTA) 60 MG capsule TAKE 1 CAPSULE BY MOUTH ONCE DAILY 05/08/23   Tillman Abide I, MD  ferrous sulfate (FEROSUL) 325 (65 FE) MG tablet TAKE ONE TABLET EACH MORNING WITH BREAKFAST 08/07/23   Karie Schwalbe, MD  fluticasone (FLONASE) 50 MCG/ACT nasal spray Place 2 sprays into both nostrils daily. Patient taking differently: Place 2 sprays into both nostrils daily as needed for allergies or rhinitis. 07/11/22   Karie Schwalbe, MD  gabapentin (NEURONTIN) 300 MG capsule TAKE 1 CAPSULE BY MOUTH TWICE DAILY AND TAKE 4 CAPSULES AT BEDTIME 04/13/23   Tillman Abide I, MD  hydrochlorothiazide (HYDRODIURIL) 25 MG tablet Take 1 tablet (25 mg total) by mouth daily. 09/29/22   Karie Schwalbe, MD  hydrocortisone (ANUSOL-HC) 25 MG suppository Place 1 suppository (25 mg total) rectally daily. Patient taking differently: Place 25 mg rectally daily as needed for hemorrhoids. 10/12/22   Toney Reil, MD  lipase/protease/amylase (CREON) 36000 UNITS CPEP capsule Take 2 capsules (72,000 Units total) by mouth 3 (three) times daily with meals. May also take 1 capsule (36,000 Units total) as needed (with snacks). Patient  taking differently: Take 36,000 units by mouth 3 (three) times daily with meals.  07/15/22   Toney Reil, MD  lisinopril (ZESTRIL) 20 MG tablet Take 1 tablet (20 mg total) by mouth daily. 09/29/22   Karie Schwalbe, MD  meloxicam (MOBIC) 15 MG tablet TAKE 1 TABLET BY MOUTH DAILY AS NEEDED FOR PAIN 08/07/23   Karie Schwalbe, MD  Multiple Vitamin (MULTIVITAMIN) tablet Take 1 tablet by mouth daily.    [provider]  omeprazole (PRILOSEC) 40 MG capsule TAKE 1 CAPSULE BY MOUTH 2 TIMES DAILY BEFORE A MEAL. 08/07/23   Toney Reil, MD  oxyCODONE-acetaminophen (PERCOCET/ROXICET) 5-325 MG tablet Take 1-2 tablets by mouth 2 (two) times daily as needed for severe pain. 07/12/23   Karie Schwalbe, MD  Sodium Fluoride  (PREVIDENT 5000 BOOSTER PLUS DT) Place 1 application  onto teeth 2 (two) times daily.    [provider]  tadalafil (CIALIS) 20 MG tablet Take 1 tablet (20 mg total) by mouth daily as needed for erectile dysfunction. 04/19/23   Vanna Scotland, MD  traZODone (DESYREL) 50 MG tablet Take 1-2 tablets (50-100 mg total) by mouth at bedtime as needed for sleep. 07/12/23   Karie Schwalbe, MD    Allergies as of 07/19/2023   (No Known Allergies)    Family History  Problem Relation Age of Onset   Hypertension Mother    Diabetes Mother    Kidney disease Mother    Heart disease Mother    Colon cancer Father 79   Asthma Sister    Obesity Sister    Stroke Sister    Obesity Brother    Diabetes Other    Heart disease Other    Hypertension Other    Kidney disease Other    Cancer Paternal Uncle        unk type, possible prostate   Lung cancer Paternal Grandmother     Social History   Socioeconomic History   Marital status: Single    Spouse name: Not on file   Number of children: 3   Years of education: Not on file   Highest education level: Not on file  Occupational History   Occupation: Hydrographic surveyor  Tobacco Use   Smoking status: Some Days    Types: Cigars    Passive exposure: Past   Smokeless tobacco: Never   Tobacco comments:    occasional cigar  Vaping Use   Vaping status: Never Used  Substance and Sexual Activity   Alcohol use: Not Currently    Comment: occas   Drug use: No   Sexual activity: Yes  Other Topics Concern   Not on file  Social History Narrative   Not on file   Social Determinants of Health   Financial Resource Strain: Not on file  Food Insecurity: No Food Insecurity (03/06/2023)   Hunger Vital Sign    Worried About Running Out of Food in the Last Year: Never true    Ran Out of Food in the Last Year: Never true  Transportation Needs: No Transportation Needs (03/06/2023)   PRAPARE - Administrator, Civil Service (Medical):  No    Lack of Transportation (Non-Medical): No  Physical Activity: Not on file  Stress: Not on file  Social Connections: Not on file  Intimate Partner Violence: Not At Risk (03/06/2023)   Humiliation, Afraid, Rape, and Kick questionnaire    Fear of Current or Ex-Partner: No    Emotionally Abused: No  Physically Abused: No    Sexually Abused: No    Review of Systems: See HPI, otherwise negative ROS  Physical Exam: BP (!) 160/94   Pulse 72   Temp 98.3 F (36.8 C) (Temporal)   Ht 5\' 10"  (1.778 m)   Wt 79.4 kg   SpO2 99%   BMI 25.11 kg/m  General:   Alert,  pleasant and cooperative in NAD Head:  Normocephalic and atraumatic. Neck:  Supple; no masses or thyromegaly. Lungs:  Clear throughout to auscultation.    Heart:  Regular rate and rhythm. Abdomen:  Soft, nontender and nondistended. Normal bowel sounds, without guarding, and without rebound.   Neurologic:  Alert and  oriented x4;  grossly normal neurologically.  Impression/Plan: Harold Smith is here for an endoscopy to be performed for erosive esophagitis  Risks, benefits, limitations, and alternatives regarding  endoscopy have been reviewed with the patient.  Questions have been answered.  All parties agreeable.   Lannette Donath, MD  08/29/2023, 10:35 AM

## 2023-08-29 NOTE — Anesthesia Preprocedure Evaluation (Addendum)
Anesthesia Evaluation  Patient identified by MRN, date of birth, ID band Patient awake    Reviewed: Allergy & Precautions, NPO status , Patient's Chart, lab work & pertinent test results  History of Anesthesia Complications Negative for: history of anesthetic complications  Airway Mallampati: III  TM Distance: >3 FB Neck ROM: full    Dental no notable dental hx. (+) Poor Dentition   Pulmonary sleep apnea , Current Smoker and Patient abstained from smoking.   Pulmonary exam normal        Cardiovascular hypertension, Normal cardiovascular exam     Neuro/Psych  PSYCHIATRIC DISORDERS Anxiety Depression    negative neurological ROS     GI/Hepatic Neg liver ROS, PUD,GERD  ,,  Endo/Other  negative endocrine ROS    Renal/GU negative Renal ROS  negative genitourinary   Musculoskeletal   Abdominal   Peds  Hematology  (+) Blood dyscrasia, anemia   Anesthesia Other Findings Past Medical History: No date: Allergic rhinitis due to pollen No date: Anemia No date: Anxiety No date: Chronic pain No date: Colon polyps No date: Depression No date: Diverticulitis large intestine No date: GERD (gastroesophageal reflux disease) No date: HTN (hypertension) No date: Irritable bowel syndrome 09/01/2022: Metabolic bone disease No date: Obesity No date: Osteoarthritis, knee No date: Sleep apnea     Comment:  does not use cpap, pt states he no longer needed CPAP               due to weight loss No date: Sleep disturbance 09/01/2022: Vitamin D deficiency  Past Surgical History: 03/06/2023: ABDOMINAL EXPOSURE; N/A     Comment:  Procedure: ABDOMINAL EXPOSURE;  Surgeon: Cephus Shelling, MD;  Location: [facility];  Service: Vascular;               Laterality: N/A; 12/26/2006: ACHILLES TENDON REPAIR; Right 03/06/2023: ANTERIOR LUMBAR FUSION; N/A     Comment:  Procedure: L5-S1 ANTERIOR LUMBAR FUSION 1 LEVEL;                 Surgeon: London Sheer, MD;  Location: [facility];                Service: Orthopedics;  Laterality: N/A; 07/20/2021: CATARACT EXTRACTION W/PHACO; Left     Comment:  Procedure: CATARACT EXTRACTION PHACO AND INTRAOCULAR               LENS PLACEMENT (IOC) LEFT 2.11 00:24.0;  Surgeon:               Galen Manila, MD;  Location: [facility];                Service: Ophthalmology;  Laterality: Left;  sleep apnea 08/03/2021: CATARACT EXTRACTION W/PHACO; Right     Comment:  Procedure: CATARACT EXTRACTION PHACO AND INTRAOCULAR               LENS PLACEMENT (IOC) RIGHT;  Surgeon: Galen Manila,               MD;  Location: [facility];  Service:               Ophthalmology;  Laterality: Right;  3.19 0:33.0 No date: CHOLECYSTECTOMY 12/26/2004: COLON RESECTION; Left     Comment:  due to diverticular disease at [facility] 2013?: COLONOSCOPY 11/30/2022: COLONOSCOPY; N/A     Comment:  Procedure: COLONOSCOPY;  Surgeon: Toney Reil,  MD;  Location: [facility];  Service:               Gastroenterology;  Laterality: N/A; 05/22/2018: COLONOSCOPY WITH PROPOFOL; N/A     Comment:  Procedure: COLONOSCOPY WITH PROPOFOL;  Surgeon: Toney Reil, MD;  Location: [facility];  Service:               Gastroenterology;  Laterality: N/A; 10/22/2020: COLONOSCOPY WITH PROPOFOL; N/A     Comment:  Procedure: COLONOSCOPY WITH PROPOFOL;  Surgeon: Toney Reil, MD;  Location: [facility];                Service: Endoscopy;  Laterality: N/A;  priority 4 11/10/2021: COLONOSCOPY WITH PROPOFOL; N/A     Comment:  Procedure: COLONOSCOPY WITH PROPOFOL;  Surgeon: Toney Reil, MD;  Location: [facility];  Service:               Gastroenterology;  Laterality: N/A; 06/16/2022: COLONOSCOPY WITH PROPOFOL; N/A     Comment:  Procedure: COLONOSCOPY WITH PROPOFOL;  Surgeon: Toney Reil, MD;   Location: [facility];                Service: Endoscopy;  Laterality: N/A; 11/29/2022: COLONOSCOPY WITH PROPOFOL; N/A     Comment:  Procedure: COLONOSCOPY WITH PROPOFOL;  Surgeon: Toney Reil, MD;  Location: [facility];  Service:               Gastroenterology;  Laterality: N/A; 06/10/2019: DRUG INDUCED ENDOSCOPY; N/A     Comment:  Procedure: DRUG INDUCED SLEEP ENDOSCOPY;  Surgeon:               Osborn Coho, MD;  Location: [facility];  Service: ENT;  Laterality: N/A; 06/16/2022: ESOPHAGOGASTRODUODENOSCOPY (EGD) WITH PROPOFOL; N/A     Comment:  Procedure: ESOPHAGOGASTRODUODENOSCOPY (EGD) WITH               PROPOFOL;  Surgeon: Toney Reil, MD;  Location:               [facility];  Service: Endoscopy;  Laterality:               N/A; 05/24/2023: ESOPHAGOGASTRODUODENOSCOPY (EGD) WITH PROPOFOL; N/A     Comment:  Procedure: ESOPHAGOGASTRODUODENOSCOPY (EGD) WITH               PROPOFOL;  Surgeon: Toney Reil, MD;  Location:               [facility];  Service: Gastroenterology;  Laterality:               N/A; 12/27/2007: GASTRIC BYPASS; N/A 06/22/2023: HARDWARE REMOVAL; Right     Comment:  Procedure: HARDWARE REMOVAL;  Surgeon: Myrene Galas,               MD;  Location: [facility];  Service: Orthopedics;  Laterality:              Right; 09/25/2014: HIATAL HERNIA  REPAIR     Comment:  at [facility] 06/22/2023: KNEE ARTHROSCOPY; Right     Comment:  Procedure: ARTHROSCOPY KNEE;  Surgeon: Myrene Galas,               MD;  Location: [facility] OR;  Service: Orthopedics;  Laterality:              Right; 08/30/2022: ORIF FEMUR FRACTURE; Right     Comment:  Procedure: OPEN REDUCTION INTERNAL FIXATION (ORIF)               DISTAL FEMUR FRACTURE;  Surgeon: Myrene Galas, MD;                Location: [facility];  Service: Orthopedics;  Laterality:               Right; 10/22/2020: POLYPECTOMY     Comment:  Procedure: POLYPECTOMY;   Surgeon: Toney Reil,               MD;  Location: [facility];  Service: Endoscopy;; 06/16/2022: POLYPECTOMY     Comment:  Procedure: POLYPECTOMY;  Surgeon: Toney Reil,               MD;  Location: [facility];  Service: Endoscopy;; No date: ROTATOR CUFF REPAIR; Right 09/25/2014: ROUX-EN-Y GASTRIC BYPASS     Comment:  revision     Reproductive/Obstetrics negative OB ROS                             Anesthesia Physical Anesthesia Plan  ASA: 3  Anesthesia Plan: General   Post-op Pain Management: Minimal or no pain anticipated   Induction: Intravenous  PONV Risk Score and Plan: 1 and Propofol infusion and TIVA  Airway Management Planned: Natural Airway and Nasal Cannula  Additional Equipment:   Intra-op Plan:   Post-operative Plan:   Informed Consent: I have reviewed the patients History and Physical, chart, labs and discussed the procedure including the risks, benefits and alternatives for the proposed anesthesia with the patient or authorized representative who has indicated his/her understanding and acceptance.     Dental Advisory Given  Plan Discussed with: Anesthesiologist, CRNA and Surgeon  Anesthesia Plan Comments: (Patient consented for risks of anesthesia including but not limited to:  - adverse reactions to medications - risk of airway placement if required - damage to eyes, teeth, lips or other oral mucosa - nerve damage due to positioning  - sore throat or hoarseness - Damage to heart, brain, nerves, lungs, other parts of body or loss of life  Patient voiced understanding.)       Anesthesia Quick Evaluation

## 2023-08-30 ENCOUNTER — Other Ambulatory Visit (INDEPENDENT_AMBULATORY_CARE_PROVIDER_SITE_OTHER): Payer: 59

## 2023-08-30 ENCOUNTER — Ambulatory Visit (INDEPENDENT_AMBULATORY_CARE_PROVIDER_SITE_OTHER): Payer: 59 | Admitting: Orthopedic Surgery

## 2023-08-30 ENCOUNTER — Encounter: Payer: Self-pay | Admitting: Gastroenterology

## 2023-08-30 DIAGNOSIS — Z981 Arthrodesis status: Secondary | ICD-10-CM

## 2023-08-30 NOTE — Anesthesia Postprocedure Evaluation (Signed)
Anesthesia Post Note  Patient: Harold Smith  Procedure(s) Performed: ESOPHAGOGASTRODUODENOSCOPY (EGD) WITH PROPOFOL BIOPSY  Patient location during evaluation: Endoscopy Anesthesia Type: General Level of consciousness: awake and alert Pain management: pain level controlled Vital Signs Assessment: post-procedure vital signs reviewed and stable Respiratory status: spontaneous breathing, nonlabored ventilation, respiratory function stable and patient connected to nasal cannula oxygen Cardiovascular status: blood pressure returned to baseline and stable Postop Assessment: no apparent nausea or vomiting Anesthetic complications: no   No notable events documented.   Last Vitals:  Vitals:   08/29/23 1126 08/29/23 1136  BP: (!) 136/93 (!) 151/102  Pulse:    Resp:    Temp:    SpO2:      Last Pain:  Vitals:   08/30/23 0747  TempSrc:   PainSc: 0-No pain                 Louie Boston

## 2023-08-30 NOTE — Progress Notes (Signed)
Orthopedic Surgery Post-operative Office Visit   Procedure: L5/S1 ALIF and PSIF Date of Surgery: 03/06/2023 (~6 months post-op)   Assessment: Patient is a 57 y.o. who is still having significant back pain after surgery but leg pain has resolved      Plan: -Operative plans complete -No spine specific precautions -Ordered CT scan to evaluate for fusion. Discussed hardware removal since he has been having pain around the posterior instrumentation and the L5 screw appears lateral. I told him that this surgery would not predictably relieve back pain could be done to attempt to help him the back pain. I told him that even if the screw is lateral that lateral screws are commonly asymptomatic. I went over the fact that spine surgery more predictably relieves leg pain but is not predictable at relieving back pain. -Pain management: per PCP -Return to office in 4 week, lumbar x-rays needed at next visit: AP/lateral/flex/ex lumbar   ___________________________________________________________________________     Subjective: Patient has been having significant back pain since surgery. His radiating leg pain has resolved. He notes the back pain is worse when changing positions (e.g., when going from seated to standing position). He often feels his back is catching when doing these changes in position. Pain is better if he is staying in one position. He also notes back pain is worse if he coughs or sneezes. Denies paresthesias and numbness.   Objective:   General: no acute distress, appropriate affect Neurologic: alert, answering questions appropriately, following commands Respiratory: unlabored breathing on room air Skin: incisions are well healed with no erythema, induration, or active/expressible drainage   MSK (spine):   -Strength exam                                                   Left                  Right   EHL                              5/5                  5/5 TA                                  5/5                  5/5 GSC                             5/5                  5/5 Knee extension            5/5                  5/5 Hip flexion                    5/5                  5/5   -Sensory exam  Sensation intact to light touch in L3-S1 nerve distributions of bilateral lower extremities   Imaging: X-rays of the lumbar spine taken 08/30/2023 were independently reviewed and interpreted, showing posterior instrumentation at L5 and S1. The right L5 screw appears lateral. The remaining screws appear in appropriate position. ALIF cage at L5/S1 is in appropriate position. There is no lucency around the cage. No cage subsidence seen.     Patient name: Harold Smith Patient MRN: [id number] Date of visit: 08/30/23

## 2023-08-31 ENCOUNTER — Encounter: Payer: 59 | Admitting: Physical Therapy

## 2023-09-01 ENCOUNTER — Telehealth: Payer: Self-pay

## 2023-09-01 MED ORDER — OMEPRAZOLE 40 MG PO CPDR: 40.0000 mg | DELAYED_RELEASE_CAPSULE | Freq: Two times a day (BID) | ORAL | 2 refills | Status: DC

## 2023-09-01 NOTE — Telephone Encounter (Signed)
Patient verbalized understanding of results. He states he does need a refill on medication and made his yearly follow up appointment

## 2023-09-01 NOTE — Telephone Encounter (Signed)
-----   Message from [facility] sent at 08/31/2023 10:39 PM EDT ----- Pathology results from upper endoscopy shows inflammation secondary to reflux, continue omeprazole 40mg  twice daily because of history of severe erosive esophagitis  RV

## 2023-09-04 ENCOUNTER — Encounter: Payer: Self-pay | Admitting: Internal Medicine

## 2023-09-04 MED ORDER — OXYCODONE-ACETAMINOPHEN 5-325 MG PO TABS: 1.0000 | ORAL_TABLET | Freq: Two times a day (BID) | ORAL | 0 refills | Status: DC | PRN

## 2023-09-06 ENCOUNTER — Encounter: Payer: Self-pay | Admitting: Orthopedic Surgery

## 2023-09-06 MED ORDER — BACLOFEN 10 MG PO TABS: 10.0000 mg | ORAL_TABLET | Freq: Three times a day (TID) | ORAL | 0 refills | Status: DC

## 2023-09-08 ENCOUNTER — Other Ambulatory Visit: Payer: Self-pay | Admitting: Internal Medicine

## 2023-09-08 ENCOUNTER — Other Ambulatory Visit: Payer: Self-pay

## 2023-09-08 DIAGNOSIS — G8929 Other chronic pain: Secondary | ICD-10-CM

## 2023-09-11 NOTE — Progress Notes (Signed)
Surgical Instructions   Your procedure is scheduled on September 19, 2023. Report to [facility] Main Entrance "A" at 12:20 P.M., then check in with the Admitting office. Any questions or running late day of surgery: call [phone]  Questions prior to your surgery date: call [phone], Monday-Friday, 8am-4pm. If you experience any cold or flu symptoms such as cough, fever, chills, shortness of breath, etc. between now and your scheduled surgery, please notify us at the above number.     Remember:  Do not eat after midnight the night before your surgery  Patient Instructions  The night before surgery:  No food after midnight. ONLY clear liquids after midnight  The day of surgery (if you do NOT have diabetes):  Drink ONE (1) Pre-Surgery Clear Ensure by 11:20 the morning of surgery. Drink in one sitting. Do not sip.  This drink was given to you during your hospital  pre-op appointment visit.  Nothing else to drink after completing the  Pre-Surgery Clear Ensure. .         If you have questions, please contact your surgeon's office.   You may drink clear liquids until 11:20 the morning of your surgery.   Clear liquids allowed are: Water, Non-Citrus Juices (without pulp), Carbonated Beverages, Clear Tea, Black Coffee Only (NO MILK, CREAM OR POWDERED CREAMER of any kind), and Gatorade.    Take these medicines the morning of surgery with A SIP OF WATER:  carboxymethylcellulose (REFRESH PLUS)  cetirizine (ZYRTEC)  DULoxetine (CYMBALTA)  omeprazole (PRILOSEC)    May take these medicines IF NEEDED:  cyclobenzaprine (FLEXERIL)  fluticasone (FLONASE)  gabapentin (NEURONTIN)  oxyCODONE-acetaminophen (PERCOCET/ROXICET)  traZODone (DESYREL)    One week prior to surgery, STOP taking any Aspirin (unless otherwise instructed by your surgeon) Aleve, Naproxen, Ibuprofen, Mobic, Motrin, Advil, Goody's, BC's, all herbal medications, fish oil, and non-prescription vitamins.                      Do NOT Smoke (Tobacco/Vaping) for 24 hours prior to your procedure.  If you use a CPAP at night, you may bring your mask/headgear for your overnight stay.   You will be asked to remove any contacts, glasses, piercing's, hearing aid's, dentures/partials prior to surgery. Please bring cases for these items if needed.    Patients discharged the day of surgery will not be allowed to drive home, and someone needs to stay with them for 24 hours.  SURGICAL WAITING ROOM VISITATION Patients may have no more than 2 support people in the waiting area - these visitors may rotate.   Pre-op nurse will coordinate an appropriate time for 1 ADULT support person, who may not rotate, to accompany patient in pre-op.  Children under the age of 45 must have an adult with them who is not the patient and must remain in the main waiting area with an adult.  If the patient needs to stay at the hospital during part of their recovery, the visitor guidelines for inpatient rooms apply.  Please refer to the [facility] website for the visitor guidelines for any additional information.   If you received a COVID test during your pre-op visit  it is requested that you wear a mask when out in public, stay away from anyone that may not be feeling well and notify your surgeon if you develop symptoms. If you have been in contact with anyone that has tested positive in the last 10 days please notify you surgeon.  Pre-operative 5 CHG Bathing Instructions   You can play a key role in reducing the risk of infection after surgery. Your skin needs to be as free of germs as possible. You can reduce the number of germs on your skin by washing with CHG (chlorhexidine gluconate) soap before surgery. CHG is an antiseptic soap that kills germs and continues to kill germs even after washing.   DO NOT use if you have an allergy to chlorhexidine/CHG or antibacterial soaps. If your skin becomes reddened or irritated, stop  using the CHG and notify one of our RNs at [phone].   Please shower with the CHG soap starting 4 days before surgery using the following schedule:     Please keep in mind the following:  DO NOT shave, including legs and underarms, starting the day of your first shower.   You may shave your face at any point before/day of surgery.  Place clean sheets on your bed the day you start using CHG soap. Use a clean washcloth (not used since being washed) for each shower. DO NOT sleep with pets once you start using the CHG.   CHG Shower Instructions:  If you choose to wash your hair and private area, wash first with your normal shampoo/soap.  After you use shampoo/soap, rinse your hair and body thoroughly to remove shampoo/soap residue.  Turn the water OFF and apply about 3 tablespoons (45 ml) of CHG soap to a CLEAN washcloth.  Apply CHG soap ONLY FROM YOUR NECK DOWN TO YOUR TOES (washing for 3-5 minutes)  DO NOT use CHG soap on face, private areas, open wounds, or sores.  Pay special attention to the area where your surgery is being performed.  If you are having back surgery, having someone wash your back for you may be helpful. Wait 2 minutes after CHG soap is applied, then you may rinse off the CHG soap.  Pat dry with a clean towel  Put on clean clothes/pajamas   If you choose to wear lotion, please use ONLY the CHG-compatible lotions on the back of this paper.   Additional instructions for the day of surgery: DO NOT APPLY any lotions, deodorants, cologne, or perfumes.   Do not bring valuables to the hospital. [facility] is not responsible for any belongings/valuables. Do not wear nail polish, gel polish, artificial nails, or any other type of covering on natural nails (fingers and toes) Do not wear jewelry or makeup Put on clean/comfortable clothes.  Please brush your teeth.  Ask your nurse before applying any prescription medications to the skin.     CHG Compatible Lotions    Aveeno Moisturizing lotion  Cetaphil Moisturizing Cream  Cetaphil Moisturizing Lotion  Clairol Herbal Essence Moisturizing Lotion, Dry Skin  Clairol Herbal Essence Moisturizing Lotion, Extra Dry Skin  Clairol Herbal Essence Moisturizing Lotion, Normal Skin  Curel Age Defying Therapeutic Moisturizing Lotion with Alpha Hydroxy  Curel Extreme Care Body Lotion  Curel Soothing Hands Moisturizing Hand Lotion  Curel Therapeutic Moisturizing Cream, Fragrance-Free  Curel Therapeutic Moisturizing Lotion, Fragrance-Free  Curel Therapeutic Moisturizing Lotion, Original Formula  Eucerin Daily Replenishing Lotion  Eucerin Dry Skin Therapy Plus Alpha Hydroxy Crme  Eucerin Dry Skin Therapy Plus Alpha Hydroxy Lotion  Eucerin Original Crme  Eucerin Original Lotion  Eucerin Plus Crme Eucerin Plus Lotion  Eucerin TriLipid Replenishing Lotion  Keri Anti-Bacterial Hand Lotion  Keri Deep Conditioning Original Lotion Dry Skin Formula Softly Scented  Keri Deep Conditioning Original Lotion, Fragrance Free Sensitive Skin Formula  Keri Lotion Fast [employer] Fragrance Free Sensitive Skin Formula  Keri Lotion Fast Absorbing Softly Scented Dry Skin Formula  Keri Original Lotion  Keri Skin Renewal Lotion Keri Silky Smooth Lotion  Keri Silky Smooth Sensitive Skin Lotion  Nivea Body Creamy Conditioning Oil  Nivea Body Extra Enriched Lotion  Nivea Body Original Lotion  Nivea Body Sheer Moisturizing Lotion Nivea Crme  Nivea Skin Firming Lotion  NutraDerm 30 Skin Lotion  NutraDerm Skin Lotion  NutraDerm Therapeutic Skin Cream  NutraDerm Therapeutic Skin Lotion  ProShield Protective Hand Cream  Provon moisturizing lotion  Please read over the following fact sheets that you were given.

## 2023-09-12 ENCOUNTER — Other Ambulatory Visit: Payer: Self-pay

## 2023-09-12 ENCOUNTER — Ambulatory Visit
Admission: RE | Admit: 2023-09-12 | Discharge: 2023-09-12 | Disposition: A | Discharge status: Home / Self Care | Payer: 59 | Source: Ambulatory Visit | Attending: Orthopaedic Surgery | Admitting: Orthopaedic Surgery

## 2023-09-12 ENCOUNTER — Encounter (HOSPITAL_COMMUNITY): Payer: Self-pay

## 2023-09-12 ENCOUNTER — Encounter (HOSPITAL_COMMUNITY)
Admission: RE | Admit: 2023-09-12 | Discharge: 2023-09-12 | Disposition: A | Discharge status: Home / Self Care | Payer: 59 | Source: Ambulatory Visit | Attending: Orthopaedic Surgery | Admitting: Orthopaedic Surgery

## 2023-09-12 VITALS — BP 148/89 | HR 77 | Temp 98.5°F | Resp 18 | Ht 70.0 in | Wt 179.3 lb

## 2023-09-12 DIAGNOSIS — M1731 Unilateral post-traumatic osteoarthritis, right knee: Secondary | ICD-10-CM | POA: Insufficient documentation

## 2023-09-12 DIAGNOSIS — M1711 Unilateral primary osteoarthritis, right knee: Secondary | ICD-10-CM | POA: Diagnosis not present

## 2023-09-12 DIAGNOSIS — Z01812 Encounter for preprocedural laboratory examination: Secondary | ICD-10-CM | POA: Diagnosis not present

## 2023-09-12 DIAGNOSIS — Z01810 Encounter for preprocedural cardiovascular examination: Secondary | ICD-10-CM | POA: Insufficient documentation

## 2023-09-12 DIAGNOSIS — M25461 Effusion, right knee: Secondary | ICD-10-CM | POA: Diagnosis not present

## 2023-09-12 DIAGNOSIS — I2510 Atherosclerotic heart disease of native coronary artery without angina pectoris: Secondary | ICD-10-CM | POA: Insufficient documentation

## 2023-09-12 DIAGNOSIS — M7121 Synovial cyst of popliteal space [Baker], right knee: Secondary | ICD-10-CM | POA: Diagnosis not present

## 2023-09-12 DIAGNOSIS — G8929 Other chronic pain: Secondary | ICD-10-CM

## 2023-09-12 DIAGNOSIS — Z01818 Encounter for other preprocedural examination: Secondary | ICD-10-CM | POA: Diagnosis not present

## 2023-09-12 DIAGNOSIS — M25561 Pain in right knee: Secondary | ICD-10-CM | POA: Diagnosis not present

## 2023-09-12 LAB — COMPREHENSIVE METABOLIC PANEL
ALT: 71 U/L — ABNORMAL HIGH (ref 0–44)
AST: 46 U/L — ABNORMAL HIGH (ref 15–41)
Albumin: 3.6 g/dL (ref 3.5–5.0)
Alkaline Phosphatase: 98 U/L (ref 38–126)
Anion gap: 7 (ref 5–15)
BUN: 10 mg/dL (ref 6–20)
CO2: 24 mmol/L (ref 22–32)
Calcium: 8.5 mg/dL — ABNORMAL LOW (ref 8.9–10.3)
Chloride: 106 mmol/L (ref 98–111)
Creatinine, Ser: 0.83 mg/dL (ref 0.61–1.24)
GFR, Estimated: >60 mL/min (ref >60)
Glucose, Bld: 111 mg/dL — ABNORMAL HIGH (ref 70–99)
Potassium: 3.4 mmol/L — ABNORMAL LOW (ref 3.5–5.1)
Sodium: 137 mmol/L (ref 135–145)
Total Bilirubin: 0.4 mg/dL (ref 0.3–1.2)
Total Protein: 6.7 g/dL (ref 6.5–8.1)

## 2023-09-12 LAB — CBC
HCT: 42.3 % (ref 39.0–52.0)
Hemoglobin: 14 g/dL (ref 13.0–17.0)
MCH: 32 pg (ref 26.0–34.0)
MCHC: 33.1 g/dL (ref 30.0–36.0)
MCV: 96.8 fL (ref 80.0–100.0)
Platelets: 285 10*3/uL (ref 150–400)
RBC: 4.37 MIL/uL (ref 4.22–5.81)
RDW: 16.2 % — ABNORMAL HIGH (ref 11.5–15.5)
WBC: 8.2 10*3/uL (ref 4.0–10.5)
nRBC: 0 % (ref 0.0–0.2)

## 2023-09-12 LAB — SURGICAL PCR SCREEN
MRSA, PCR: NEGATIVE
Staphylococcus aureus: NEGATIVE

## 2023-09-12 LAB — NO BLOOD PRODUCTS

## 2023-09-12 NOTE — Progress Notes (Addendum)
PCP - Dr. Tillman Abide Cardiologist - Denies   PPM/ICD - Denies Device Orders - n/a Rep Notified - n/a   Chest x-ray - 08/28/2022 EKG - 09/12/2023 Stress Test - 08/26/2014 - Results requested at March 2024 PAT visit.  ECHO - 10/26/2021 Cardiac Cath - Denies   Sleep Study - +OSA in 2009. Pt had gastric bypass surgery (pt was 400+ lbs) in 2009. After significant weight loss, he stopped using his CPAP after 2010. Has not used it since and kept weight off.   No DM   Last dose of GLP1 agonist- n/a GLP1 instructions: n/a   Blood Thinner Instructions: n/a Aspirin Instructions: n/a   ERAS Protcol - Clear liquids until 0920 PRE-SURGERY Ensure or G2- Ensure given to pt with instructions   COVID TEST- n/a     Anesthesia review: No.  Patient denies shortness of breath, fever, cough and chest pain at PAT appointment. Pt denies any respiratory illness/infection in the past two months.     All instructions explained to the patient, with a verbal understanding of the material. Patient agrees to go over the instructions while at home for a better understanding. Patient also instructed to self quarantine after being tested for COVID-19. The opportunity to ask questions was provided.

## 2023-09-14 ENCOUNTER — Ambulatory Visit: Payer: 59

## 2023-09-15 ENCOUNTER — Other Ambulatory Visit: Payer: Self-pay

## 2023-09-18 NOTE — H&P (Signed)
TOTAL KNEE ADMISSION H&P  Patient is being admitted for right total knee arthroplasty.  Subjective:  Chief Complaint:right knee pain.  HPI: Harold Smith, 57 y.o. male, has a history of pain and functional disability in the right knee due to trauma and has failed non-surgical conservative treatments for greater than 12 weeks to includeNSAID's and/or analgesics, corticosteriod injections, use of assistive devices, and activity modification.  Onset of symptoms was abrupt, starting 1 years ago with gradually worsening course since that time. The patient noted prior procedures on the knee to include  ORIF  on the right knee(s).  Patient currently rates pain in the right knee(s) at 10 out of 10 with activity. Patient has night pain, worsening of pain with activity and weight bearing, pain that interferes with activities of daily living, pain with passive range of motion, and joint swelling.  Patient has evidence of subchondral sclerosis, periarticular osteophytes, and joint space narrowing by imaging studies. This patient has had distal femur fracture. There is no active infection.  Patient Active Problem List   Diagnosis Date Noted   Post-traumatic osteoarthritis of right knee 08/22/2023   Chronic pain syndrome 07/12/2023   Anxiety 09/01/2022   Vitamin D deficiency 09/01/2022   Metabolic bone disease  09/01/2022   Displaced supracondylar fracture of distal end of right femur without intracondylar extension (HCC) 08/29/2022   Mood disorder (HCC) 07/08/2022   Erosive esophagitis    Disability of walking 06/01/2022   Degeneration of lumbar intervertebral disc 06/01/2022   Iron deficiency anemia 05/10/2022   Lumbar spondylolysis 02/25/2022   Sleep disturbance 07/02/2021   Preventative health care 06/23/2018   Personal hx of gastric bypass 06/23/2018   Erectile dysfunction 06/23/2018   Hx of adenomatous colonic polyps    Essential hypertension 10/17/2013   Diverticulosis large intestine  w/o perforation or abscess w/bleeding    Colon polyps    Past Medical History:  Diagnosis Date   Allergic rhinitis due to pollen    Anemia    Anxiety    Chronic pain    Colon polyps    Depression    Diverticulitis large intestine    GERD (gastroesophageal reflux disease)    HTN (hypertension)    Irritable bowel syndrome    Metabolic bone disease 09/01/2022   Obesity    Osteoarthritis, knee    Sleep apnea    does not use cpap, pt states he no longer needed CPAP due to weight loss   Sleep disturbance    Vitamin D deficiency 09/01/2022    Past Surgical History:  Procedure Laterality Date   ABDOMINAL EXPOSURE N/A 03/06/2023   Procedure: ABDOMINAL EXPOSURE;  Surgeon: Cephus Shelling, MD;  [facility];  Service: Vascular;  Laterality: N/A;   ACHILLES TENDON REPAIR Right 12/26/2006   ANTERIOR LUMBAR FUSION N/A 03/06/2023   Procedure: L5-S1 ANTERIOR LUMBAR FUSION 1 LEVEL;  Surgeon: London Sheer, MD;  Location: [facility] OR;  Service: Orthopedics;  Laterality: N/A;   BIOPSY  08/29/2023   Procedure: BIOPSY;  Surgeon: Toney Reil, MD;  Location: [facility] ENDOSCOPY;  Service: Gastroenterology;;   CATARACT EXTRACTION W/PHACO Left 07/20/2021   Procedure: CATARACT EXTRACTION PHACO AND INTRAOCULAR LENS PLACEMENT (IOC) LEFT 2.11 00:24.0;  Surgeon: Galen Manila, MD;  Location: [facility];  Service: Ophthalmology;  Laterality: Left;  sleep apnea   CATARACT EXTRACTION W/PHACO Right 08/03/2021   Procedure: CATARACT EXTRACTION PHACO AND INTRAOCULAR LENS PLACEMENT (IOC) RIGHT;  Surgeon: Galen Manila, MD;  Location: [facility];  Service: Ophthalmology;  Laterality: Right;  3.19 0:33.0   CHOLECYSTECTOMY     COLON RESECTION Left 12/26/2004   due to diverticular disease at [facility]   COLONOSCOPY  2013?   COLONOSCOPY N/A 11/30/2022   Procedure: COLONOSCOPY;  Surgeon: Toney Reil, MD;  Location: [facility] ENDOSCOPY;  Service: Gastroenterology;  Laterality: N/A;    COLONOSCOPY WITH PROPOFOL N/A 05/22/2018   Procedure: COLONOSCOPY WITH PROPOFOL;  Surgeon: Toney Reil, MD;  Location: [facility] ENDOSCOPY;  Service: Gastroenterology;  Laterality: N/A;   COLONOSCOPY WITH PROPOFOL N/A 10/22/2020   Procedure: COLONOSCOPY WITH PROPOFOL;  Surgeon: Toney Reil, MD;  Location: [facility];  Service: Endoscopy;  Laterality: N/A;  priority 4   COLONOSCOPY WITH PROPOFOL N/A 11/10/2021   Procedure: COLONOSCOPY WITH PROPOFOL;  Surgeon: Toney Reil, MD;  Location: [facility] ENDOSCOPY;  Service: Gastroenterology;  Laterality: N/A;   COLONOSCOPY WITH PROPOFOL N/A 06/16/2022   Procedure: COLONOSCOPY WITH PROPOFOL;  Surgeon: Toney Reil, MD;  Location: [facility];  Service: Endoscopy;  Laterality: N/A;   COLONOSCOPY WITH PROPOFOL N/A 11/29/2022   Procedure: COLONOSCOPY WITH PROPOFOL;  Surgeon: Toney Reil, MD;  Location: [facility] ENDOSCOPY;  Service: Gastroenterology;  Laterality: N/A;   DRUG INDUCED ENDOSCOPY N/A 06/10/2019   Procedure: DRUG INDUCED SLEEP ENDOSCOPY;  Surgeon: Osborn Coho, MD;  Location:  SURGERY CENTER;  Service: ENT;  Laterality: N/A;   ESOPHAGOGASTRODUODENOSCOPY (EGD) WITH PROPOFOL N/A 06/16/2022   Procedure: ESOPHAGOGASTRODUODENOSCOPY (EGD) WITH PROPOFOL;  Surgeon: Toney Reil, MD;  Location: [facility];  Service: Endoscopy;  Laterality: N/A;   ESOPHAGOGASTRODUODENOSCOPY (EGD) WITH PROPOFOL N/A 05/24/2023   Procedure: ESOPHAGOGASTRODUODENOSCOPY (EGD) WITH PROPOFOL;  Surgeon: Toney Reil, MD;  Location: [facility] ENDOSCOPY;  Service: Gastroenterology;  Laterality: N/A;   ESOPHAGOGASTRODUODENOSCOPY (EGD) WITH PROPOFOL N/A 08/29/2023   Procedure: ESOPHAGOGASTRODUODENOSCOPY (EGD) WITH PROPOFOL;  Surgeon: Toney Reil, MD;  Location: [facility] ENDOSCOPY;  Service: Gastroenterology;  Laterality: N/A;   GASTRIC BYPASS N/A 12/27/2007   HARDWARE REMOVAL Right 06/22/2023   Procedure:  HARDWARE REMOVAL;  Surgeon: Myrene Galas, MD;  Location: [facility] OR;  Service: Orthopedics;  Laterality: Right;   HIATAL HERNIA REPAIR  09/25/2014   at [facility]   KNEE ARTHROSCOPY Right 06/22/2023   Procedure: ARTHROSCOPY KNEE;  Surgeon: Myrene Galas, MD;  Location: [facility] OR;  Service: Orthopedics;  Laterality: Right;   ORIF FEMUR FRACTURE Right 08/30/2022   Procedure: OPEN REDUCTION INTERNAL FIXATION (ORIF) DISTAL FEMUR FRACTURE;  Surgeon: Myrene Galas, MD;  Location: [facility] OR;  Service: Orthopedics;  Laterality: Right;   POLYPECTOMY  10/22/2020   Procedure: POLYPECTOMY;  Surgeon: Toney Reil, MD;  Location: [facility];  Service: Endoscopy;;   POLYPECTOMY  06/16/2022   Procedure: POLYPECTOMY;  Surgeon: Toney Reil, MD;  Location: [facility];  Service: Endoscopy;;   ROTATOR CUFF REPAIR Right    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    No current facility-administered medications for this encounter.   Current Outpatient Medications  Medication Sig Dispense Refill Last Dose   CALCIUM PO Take 1 tablet by mouth daily.      carboxymethylcellulose (REFRESH PLUS) 0.5 % SOLN Place 1 drop into both eyes daily as needed (dry eyes).      cetirizine (ZYRTEC) 10 MG tablet Take 10 mg by mouth daily.      Cholecalciferol 125 MCG (5000 UT) TABS Take 1 tablet by mouth daily. (Patient taking differently: Take 5,000 Units by mouth daily.) 30 tablet 6  Cyanocobalamin (VITAMIN B-12 PO) Take 1 tablet by mouth daily.      cyclobenzaprine (FLEXERIL) 5 MG tablet Take 1-2 tablets (5-10 mg total) by mouth 3 (three) times daily as needed for muscle spasms. 60 tablet 2    DULoxetine (CYMBALTA) 60 MG capsule TAKE 1 CAPSULE BY MOUTH ONCE DAILY 90 capsule 3    ferrous sulfate (FEROSUL) 325 (65 FE) MG tablet TAKE ONE TABLET EACH MORNING WITH BREAKFAST 30 tablet 11    fluticasone (FLONASE) 50 MCG/ACT nasal spray Place 2 sprays into both nostrils daily. (Patient taking differently: Place  2 sprays into both nostrils daily as needed for allergies or rhinitis.) 48 g 3    gabapentin (NEURONTIN) 300 MG capsule TAKE 1 CAPSULE BY MOUTH TWICE DAILY AND TAKE 4 CAPSULES AT BEDTIME (Patient taking differently: Take 300-1,200 mg by mouth 3 (three) times daily as needed (pain).) 180 capsule 5    lipase/protease/amylase (CREON) 36000 UNITS CPEP capsule Take 2 capsules (72,000 Units total) by mouth 3 (three) times daily with meals. May also take 1 capsule (36,000 Units total) as needed (with snacks). (Patient taking differently: Take 36,000 units by mouth 3 (three) times daily as needed based on meal size and type ) 240 capsule 5    meloxicam (MOBIC) 15 MG tablet TAKE 1 TABLET BY MOUTH DAILY AS NEEDED FOR PAIN 90 tablet 1    Multiple Vitamin (MULTIVITAMIN) tablet Take 1 tablet by mouth daily.      omeprazole (PRILOSEC) 40 MG capsule Take 1 capsule (40 mg total) by mouth 2 (two) times daily before a meal. 60 capsule 2    Sodium Fluoride (PREVIDENT 5000 BOOSTER PLUS DT) Place 1 application  onto teeth 2 (two) times daily.      traZODone (DESYREL) 50 MG tablet Take 1-2 tablets (50-100 mg total) by mouth at bedtime as needed for sleep. 180 tablet 1    alendronate (FOSAMAX) 70 MG tablet Take 1 tablet (70 mg total) by mouth once a week. Take with a full glass of water on an empty stomach. (Patient not taking: Reported on 09/04/2023) 10 tablet 0 Not Taking   baclofen (LIORESAL) 10 MG tablet Take 1 tablet (10 mg total) by mouth 3 (three) times daily. 90 tablet 0    dicyclomine (BENTYL) 10 MG capsule Take 1 to 2 capsules as needed for abdominal cramps and diarrhea every 6 to 8 hours (Patient not taking: Reported on 09/04/2023) 240 capsule 3 Not Taking   hydrochlorothiazide (HYDRODIURIL) 25 MG tablet TAKE 1 TABLET BY MOUTH DAILY 90 tablet 3    hydrocortisone (ANUSOL-HC) 25 MG suppository Place 1 suppository (25 mg total) rectally daily. (Patient not taking: Reported on 09/04/2023) 14 suppository 0 Not Taking    lisinopril (ZESTRIL) 20 MG tablet TAKE 1 TABLET BY MOUTH DAILY 90 tablet 3    oxyCODONE-acetaminophen (PERCOCET/ROXICET) 5-325 MG tablet Take 1-2 tablets by mouth 2 (two) times daily as needed for severe pain. 60 tablet 0    QUEtiapine (SEROQUEL) 25 MG tablet TAKE 1-2 TABLETS BY MOUTH AT BEDTIME AS NEEDED FOR SLEEP 60 tablet 0    tadalafil (CIALIS) 20 MG tablet Take 1 tablet (20 mg total) by mouth daily as needed for erectile dysfunction. 10 tablet 11    No Known Allergies  Social History   Tobacco Use   Smoking status: Some Days    Types: Cigars    Passive exposure: Past   Smokeless tobacco: Never   Tobacco comments:    occasional cigar  Substance Use  Topics   Alcohol use: Yes    Comment: Occasionally    Family History  Problem Relation Age of Onset   Hypertension Mother    Diabetes Mother    Kidney disease Mother    Heart disease Mother    Colon cancer Father 29   Asthma Sister    Obesity Sister    Stroke Sister    Obesity Brother    Diabetes Other    Heart disease Other    Hypertension Other    Kidney disease Other    Cancer Paternal Uncle        unk type, possible prostate   Lung cancer Paternal Grandmother      Review of Systems  Objective:  Physical Exam Vitals reviewed.  Constitutional:      Appearance: Normal appearance. He is normal weight.  HENT:     Head: Normocephalic and atraumatic.  Eyes:     Extraocular Movements: Extraocular movements intact.     Pupils: Pupils are equal, round, and reactive to light.  Cardiovascular:     Rate and Rhythm: Normal rate and regular rhythm.     Pulses: Normal pulses.  Pulmonary:     Effort: Pulmonary effort is normal.     Breath sounds: Normal breath sounds.  Abdominal:     General: Abdomen is flat.  Musculoskeletal:     Cervical back: Normal range of motion and neck supple.     Right knee: Effusion, bony tenderness and crepitus present. Decreased range of motion. Tenderness present over the medial joint line  and lateral joint line. Abnormal alignment and abnormal meniscus.  Neurological:     Mental Status: He is alert and oriented to person, place, and time.  Psychiatric:        Behavior: Behavior normal.     Vital signs in last 24 hours:    Labs:   Estimated body mass index is 25.73 kg/m as calculated from the following:   Height as of 09/12/23: 5\' 10"  (1.778 m).   Weight as of 09/12/23: 81.3 kg.   Imaging Review Plain radiographs demonstrate severe degenerative joint disease of the right knee(s). The overall alignment ismild varus. The bone quality appears to be good for age and reported activity level.      Assessment/Plan:  End stage arthritis, right knee   The patient history, physical examination, clinical judgment of the provider and imaging studies are consistent with end stage degenerative joint disease of the right knee(s) and total knee arthroplasty is deemed medically necessary. The treatment options including medical management, injection therapy arthroscopy and arthroplasty were discussed at length. The risks and benefits of total knee arthroplasty were presented and reviewed. The risks due to aseptic loosening, infection, stiffness, patella tracking problems, thromboembolic complications and other imponderables were discussed. The patient acknowledged the explanation, agreed to proceed with the plan and consent was signed. Patient is being admitted for inpatient treatment for surgery, pain control, PT, OT, prophylactic antibiotics, VTE prophylaxis, progressive ambulation and ADL's and discharge planning. The patient is planning to be discharged home with home health services

## 2023-09-18 NOTE — Anesthesia Preprocedure Evaluation (Addendum)
Anesthesia Evaluation  Patient identified by MRN, date of birth, ID band Patient awake    Reviewed: Allergy & Precautions, H&P , NPO status , Patient's Chart, lab work & pertinent test results  Airway Mallampati: II  TM Distance: >3 FB Neck ROM: Full    Dental no notable dental hx. (+) Teeth Intact, Dental Advisory Given   Pulmonary sleep apnea , Current Smoker and Patient abstained from smoking.   Pulmonary exam normal breath sounds clear to auscultation       Cardiovascular Exercise Tolerance: Good hypertension, Pt. on medications  Rhythm:Regular Rate:Normal     Neuro/Psych   Anxiety Depression    negative neurological ROS     GI/Hepatic Neg liver ROS, PUD,GERD  Medicated,,  Endo/Other  negative endocrine ROS    Renal/GU negative Renal ROS  negative genitourinary   Musculoskeletal  (+) Arthritis , Osteoarthritis,    Abdominal   Peds  Hematology  (+) Blood dyscrasia, anemia   Anesthesia Other Findings   Reproductive/Obstetrics negative OB ROS                             Anesthesia Physical Anesthesia Plan  ASA: 3  Anesthesia Plan: Spinal   Post-op Pain Management: Regional block*   Induction: Intravenous  PONV Risk Score and Plan: 1 and Propofol infusion, Dexamethasone, Ondansetron and Midazolam  Airway Management Planned: Natural Airway and Simple Face Mask  Additional Equipment:   Intra-op Plan:   Post-operative Plan:   Informed Consent: I have reviewed the patients History and Physical, chart, labs and discussed the procedure including the risks, benefits and alternatives for the proposed anesthesia with the patient or authorized representative who has indicated his/her understanding and acceptance.     Dental advisory given  Plan Discussed with: CRNA  Anesthesia Plan Comments:        Anesthesia Quick Evaluation

## 2023-09-19 ENCOUNTER — Encounter (HOSPITAL_COMMUNITY)
Admission: RE | Disposition: A | Discharge status: Home Health | Payer: Self-pay | Source: Ambulatory Visit | Attending: Orthopaedic Surgery

## 2023-09-19 ENCOUNTER — Ambulatory Visit (HOSPITAL_COMMUNITY): Payer: 59 | Admitting: Physician Assistant

## 2023-09-19 ENCOUNTER — Ambulatory Visit (HOSPITAL_COMMUNITY): Payer: Self-pay | Admitting: Anesthesiology

## 2023-09-19 ENCOUNTER — Encounter (HOSPITAL_COMMUNITY): Payer: Self-pay | Admitting: Orthopaedic Surgery

## 2023-09-19 ENCOUNTER — Ambulatory Visit (HOSPITAL_COMMUNITY): Payer: 59

## 2023-09-19 ENCOUNTER — Other Ambulatory Visit: Payer: Self-pay

## 2023-09-19 ENCOUNTER — Observation Stay (HOSPITAL_COMMUNITY)
Admission: RE | Admit: 2023-09-19 | Discharge: 2023-09-21 | Disposition: A | Discharge status: Home Health | Payer: 59 | Source: Ambulatory Visit | Attending: Orthopaedic Surgery | Admitting: Orthopaedic Surgery

## 2023-09-19 DIAGNOSIS — M1731 Unilateral post-traumatic osteoarthritis, right knee: Principal | ICD-10-CM | POA: Diagnosis present

## 2023-09-19 DIAGNOSIS — Z79899 Other long term (current) drug therapy: Secondary | ICD-10-CM | POA: Diagnosis not present

## 2023-09-19 DIAGNOSIS — G8918 Other acute postprocedural pain: Secondary | ICD-10-CM | POA: Diagnosis not present

## 2023-09-19 DIAGNOSIS — Z471 Aftercare following joint replacement surgery: Secondary | ICD-10-CM | POA: Diagnosis not present

## 2023-09-19 DIAGNOSIS — I10 Essential (primary) hypertension: Secondary | ICD-10-CM | POA: Insufficient documentation

## 2023-09-19 DIAGNOSIS — Z96651 Presence of right artificial knee joint: Secondary | ICD-10-CM | POA: Diagnosis not present

## 2023-09-19 DIAGNOSIS — F17290 Nicotine dependence, other tobacco product, uncomplicated: Secondary | ICD-10-CM | POA: Insufficient documentation

## 2023-09-19 DIAGNOSIS — R609 Edema, unspecified: Secondary | ICD-10-CM | POA: Diagnosis not present

## 2023-09-19 HISTORY — PX: TOTAL KNEE ARTHROPLASTY: SHX125

## 2023-09-19 LAB — POCT I-STAT, CHEM 8
BUN: 17 mg/dL (ref 6–20)
Calcium, Ion: 1.15 mmol/L (ref 1.15–1.40)
Chloride: 106 mmol/L (ref 98–111)
Creatinine, Ser: 0.7 mg/dL (ref 0.61–1.24)
Glucose, Bld: 164 mg/dL — ABNORMAL HIGH (ref 70–99)
HCT: 39 % (ref 39.0–52.0)
Hemoglobin: 13.3 g/dL (ref 13.0–17.0)
Potassium: 3.7 mmol/L (ref 3.5–5.1)
Sodium: 140 mmol/L (ref 135–145)
TCO2: 21 mmol/L — ABNORMAL LOW (ref 22–32)

## 2023-09-19 SURGERY — ARTHROPLASTY, KNEE, TOTAL
Anesthesia: Spinal | Site: Knee | Laterality: Right

## 2023-09-19 MED ORDER — BACLOFEN 10 MG PO TABS
10.0000 mg | ORAL_TABLET | Freq: Three times a day (TID) | ORAL | Status: DC
  Administered 2023-09-19 – 2023-09-21 (×7): 10 mg via ORAL
  Filled 2023-09-19 (×7): qty 1

## 2023-09-19 MED ORDER — SODIUM CHLORIDE 0.9 % IR SOLN
Status: DC | PRN
  Administered 2023-09-19: 1000 mL

## 2023-09-19 MED ORDER — METHOCARBAMOL 500 MG PO TABS
500.0000 mg | ORAL_TABLET | Freq: Four times a day (QID) | ORAL | Status: DC | PRN
  Administered 2023-09-19 – 2023-09-21 (×5): 500 mg via ORAL
  Filled 2023-09-19 (×5): qty 1

## 2023-09-19 MED ORDER — PROPOFOL 500 MG/50ML IV EMUL
INTRAVENOUS | Status: DC | PRN
  Administered 2023-09-19: 150 ug/kg/min via INTRAVENOUS

## 2023-09-19 MED ORDER — FENTANYL CITRATE (PF) 250 MCG/5ML IJ SOLN
INTRAMUSCULAR | Status: AC
  Filled 2023-09-19: qty 5

## 2023-09-19 MED ORDER — TRAZODONE HCL 50 MG PO TABS
50.0000 mg | ORAL_TABLET | Freq: Every evening | ORAL | Status: DC | PRN
  Administered 2023-09-20: 100 mg via ORAL
  Filled 2023-09-19: qty 2

## 2023-09-19 MED ORDER — SODIUM CHLORIDE 0.9 % IV SOLN: INTRAVENOUS | Status: DC

## 2023-09-19 MED ORDER — CEFAZOLIN SODIUM-DEXTROSE 2-4 GM/100ML-% IV SOLN
2.0000 g | INTRAVENOUS | Status: AC
  Administered 2023-09-19: 2 g via INTRAVENOUS
  Filled 2023-09-19: qty 100

## 2023-09-19 MED ORDER — BUPIVACAINE IN DEXTROSE 0.75-8.25 % IT SOLN
INTRATHECAL | Status: DC | PRN
Start: 2023-09-19 — End: 2023-09-19
  Administered 2023-09-19: 1.8 mL via INTRATHECAL

## 2023-09-19 MED ORDER — ONDANSETRON HCL 4 MG PO TABS: 4.0000 mg | ORAL_TABLET | Freq: Four times a day (QID) | ORAL | Status: DC | PRN

## 2023-09-19 MED ORDER — CHLORHEXIDINE GLUCONATE 0.12 % MT SOLN
15.0000 mL | Freq: Once | OROMUCOSAL | Status: AC
  Administered 2023-09-19: 15 mL via OROMUCOSAL
  Filled 2023-09-19: qty 15

## 2023-09-19 MED ORDER — DEXAMETHASONE SODIUM PHOSPHATE 10 MG/ML IJ SOLN
INTRAMUSCULAR | Status: DC | PRN
  Administered 2023-09-19: 10 mg via INTRAVENOUS

## 2023-09-19 MED ORDER — DIPHENHYDRAMINE HCL 12.5 MG/5ML PO ELIX: 12.5000 mg | ORAL_SOLUTION | ORAL | Status: DC | PRN

## 2023-09-19 MED ORDER — DULOXETINE HCL 60 MG PO CPEP
60.0000 mg | ORAL_CAPSULE | Freq: Every day | ORAL | Status: DC
  Administered 2023-09-19 – 2023-09-21 (×3): 60 mg via ORAL
  Filled 2023-09-19 (×3): qty 1

## 2023-09-19 MED ORDER — ASPIRIN 81 MG PO CHEW
81.0000 mg | CHEWABLE_TABLET | Freq: Two times a day (BID) | ORAL | Status: DC
  Administered 2023-09-19 – 2023-09-21 (×4): 81 mg via ORAL
  Filled 2023-09-19 (×4): qty 1

## 2023-09-19 MED ORDER — HYDROMORPHONE HCL 1 MG/ML IJ SOLN
0.5000 mg | INTRAMUSCULAR | Status: DC | PRN
  Administered 2023-09-19 – 2023-09-21 (×7): 1 mg via INTRAVENOUS
  Filled 2023-09-19 (×9): qty 1

## 2023-09-19 MED ORDER — 0.9 % SODIUM CHLORIDE (POUR BTL) OPTIME
TOPICAL | Status: DC | PRN
  Administered 2023-09-19: 1000 mL

## 2023-09-19 MED ORDER — HYDROCHLOROTHIAZIDE 25 MG PO TABS
25.0000 mg | ORAL_TABLET | Freq: Every day | ORAL | Status: DC
  Administered 2023-09-19 – 2023-09-21 (×3): 25 mg via ORAL
  Filled 2023-09-19 (×3): qty 1

## 2023-09-19 MED ORDER — GABAPENTIN 100 MG PO CAPS
100.0000 mg | ORAL_CAPSULE | Freq: Three times a day (TID) | ORAL | Status: DC
  Administered 2023-09-19 – 2023-09-21 (×7): 100 mg via ORAL
  Filled 2023-09-19 (×7): qty 1

## 2023-09-19 MED ORDER — CEFAZOLIN SODIUM-DEXTROSE 1-4 GM/50ML-% IV SOLN
1.0000 g | Freq: Four times a day (QID) | INTRAVENOUS | Status: AC
  Administered 2023-09-19 (×2): 1 g via INTRAVENOUS
  Filled 2023-09-19 (×2): qty 50

## 2023-09-19 MED ORDER — MENTHOL 3 MG MT LOZG: 1.0000 | LOZENGE | OROMUCOSAL | Status: DC | PRN

## 2023-09-19 MED ORDER — PROPOFOL 10 MG/ML IV BOLUS
INTRAVENOUS | Status: AC
  Filled 2023-09-19: qty 20

## 2023-09-19 MED ORDER — BUPIVACAINE-EPINEPHRINE (PF) 0.25% -1:200000 IJ SOLN
INTRAMUSCULAR | Status: AC
  Filled 2023-09-19: qty 30

## 2023-09-19 MED ORDER — HYDROMORPHONE HCL 1 MG/ML IJ SOLN: 0.2500 mg | INTRAMUSCULAR | Status: DC | PRN

## 2023-09-19 MED ORDER — MIDAZOLAM HCL 2 MG/2ML IJ SOLN
INTRAMUSCULAR | Status: AC
  Filled 2023-09-19: qty 2

## 2023-09-19 MED ORDER — ACETAMINOPHEN 325 MG PO TABS: 325.0000 mg | ORAL_TABLET | Freq: Four times a day (QID) | ORAL | Status: DC | PRN

## 2023-09-19 MED ORDER — TRANEXAMIC ACID-NACL 1000-0.7 MG/100ML-% IV SOLN
1000.0000 mg | INTRAVENOUS | Status: AC
  Administered 2023-09-19: 1000 mg via INTRAVENOUS
  Filled 2023-09-19: qty 100

## 2023-09-19 MED ORDER — MIDAZOLAM HCL 2 MG/2ML IJ SOLN
INTRAMUSCULAR | Status: DC | PRN
  Administered 2023-09-19: 2 mg via INTRAVENOUS

## 2023-09-19 MED ORDER — BUPIVACAINE-EPINEPHRINE 0.25% -1:200000 IJ SOLN
INTRAMUSCULAR | Status: DC | PRN
Start: 2023-09-19 — End: 2023-09-19
  Administered 2023-09-19: 30 mL

## 2023-09-19 MED ORDER — TAMSULOSIN HCL 0.4 MG PO CAPS
0.4000 mg | ORAL_CAPSULE | Freq: Once | ORAL | Status: AC
  Administered 2023-09-19: 0.4 mg via ORAL
  Filled 2023-09-19: qty 1

## 2023-09-19 MED ORDER — OXYCODONE HCL 5 MG PO TABS
10.0000 mg | ORAL_TABLET | ORAL | Status: DC | PRN
  Administered 2023-09-19 – 2023-09-21 (×7): 15 mg via ORAL
  Filled 2023-09-19 (×8): qty 3

## 2023-09-19 MED ORDER — VITAMIN D 25 MCG (1000 UNIT) PO TABS
5000.0000 [IU] | ORAL_TABLET | Freq: Every day | ORAL | Status: DC
  Administered 2023-09-19 – 2023-09-21 (×3): 5000 [IU] via ORAL
  Filled 2023-09-19 (×3): qty 5

## 2023-09-19 MED ORDER — FENTANYL CITRATE (PF) 250 MCG/5ML IJ SOLN
INTRAMUSCULAR | Status: DC | PRN
  Administered 2023-09-19 (×2): 50 ug via INTRAVENOUS

## 2023-09-19 MED ORDER — OXYCODONE HCL 5 MG PO TABS: 5.0000 mg | ORAL_TABLET | ORAL | Status: DC | PRN

## 2023-09-19 MED ORDER — ACETAMINOPHEN 10 MG/ML IV SOLN
INTRAVENOUS | Status: DC | PRN
  Administered 2023-09-19: 1000 mg via INTRAVENOUS

## 2023-09-19 MED ORDER — METHOCARBAMOL 1000 MG/10ML IJ SOLN: 500.0000 mg | Freq: Four times a day (QID) | INTRAVENOUS | Status: DC | PRN

## 2023-09-19 MED ORDER — ACETAMINOPHEN 10 MG/ML IV SOLN
INTRAVENOUS | Status: AC
  Filled 2023-09-19: qty 100

## 2023-09-19 MED ORDER — BUPIVACAINE-EPINEPHRINE (PF) 0.5% -1:200000 IJ SOLN
INTRAMUSCULAR | Status: DC | PRN
  Administered 2023-09-19: 20 mL via PERINEURAL

## 2023-09-19 MED ORDER — DOCUSATE SODIUM 100 MG PO CAPS
100.0000 mg | ORAL_CAPSULE | Freq: Two times a day (BID) | ORAL | Status: DC
  Administered 2023-09-20 (×2): 100 mg via ORAL
  Filled 2023-09-19 (×4): qty 1

## 2023-09-19 MED ORDER — PHENOL 1.4 % MT LIQD: 1.0000 | OROMUCOSAL | Status: DC | PRN

## 2023-09-19 MED ORDER — ONDANSETRON HCL 4 MG/2ML IJ SOLN
4.0000 mg | Freq: Four times a day (QID) | INTRAMUSCULAR | Status: DC | PRN
  Administered 2023-09-20: 4 mg via INTRAVENOUS
  Filled 2023-09-19: qty 2

## 2023-09-19 MED ORDER — LISINOPRIL 20 MG PO TABS
20.0000 mg | ORAL_TABLET | Freq: Every day | ORAL | Status: DC
  Administered 2023-09-19 – 2023-09-21 (×3): 20 mg via ORAL
  Filled 2023-09-19 (×3): qty 1

## 2023-09-19 MED ORDER — FERROUS SULFATE 325 (65 FE) MG PO TABS
325.0000 mg | ORAL_TABLET | Freq: Every day | ORAL | Status: DC
  Administered 2023-09-20 – 2023-09-21 (×2): 325 mg via ORAL
  Filled 2023-09-19 (×2): qty 1

## 2023-09-19 MED ORDER — ORAL CARE MOUTH RINSE: 15.0000 mL | Freq: Once | OROMUCOSAL | Status: AC

## 2023-09-19 MED ORDER — LIDOCAINE HCL URETHRAL/MUCOSAL 2 % EX GEL
1.0000 | Freq: Once | CUTANEOUS | Status: DC
  Filled 2023-09-19: qty 6

## 2023-09-19 MED ORDER — LACTATED RINGERS IV SOLN: INTRAVENOUS | Status: DC

## 2023-09-19 MED ORDER — METOCLOPRAMIDE HCL 5 MG/ML IJ SOLN: 5.0000 mg | Freq: Three times a day (TID) | INTRAMUSCULAR | Status: DC | PRN

## 2023-09-19 MED ORDER — ALUM & MAG HYDROXIDE-SIMETH 200-200-20 MG/5ML PO SUSP
30.0000 mL | ORAL | Status: DC | PRN
  Administered 2023-09-20: 30 mL via ORAL
  Filled 2023-09-19 (×2): qty 30

## 2023-09-19 MED ORDER — ONDANSETRON HCL 4 MG/2ML IJ SOLN
INTRAMUSCULAR | Status: DC | PRN
  Administered 2023-09-19: 4 mg via INTRAVENOUS

## 2023-09-19 MED ORDER — METOCLOPRAMIDE HCL 5 MG PO TABS: 5.0000 mg | ORAL_TABLET | Freq: Three times a day (TID) | ORAL | Status: DC | PRN

## 2023-09-19 MED ORDER — PANTOPRAZOLE SODIUM 40 MG PO TBEC
40.0000 mg | DELAYED_RELEASE_TABLET | Freq: Every day | ORAL | Status: DC
  Administered 2023-09-19 – 2023-09-21 (×3): 40 mg via ORAL
  Filled 2023-09-19 (×3): qty 1

## 2023-09-19 SURGICAL SUPPLY — 74 items
ATTUNE MED DOME PAT 38 KNEE (Knees) IMPLANT
ATTUNE PS FEM RT SZ 6 CEM KNEE (Femur) IMPLANT
ATTUNE PSRP INSR SZ6 8 KNEE (Insert) IMPLANT
BAG COUNTER SPONGE SURGICOUNT (BAG) ×1 IMPLANT
BAG SPNG CNTER NS LX DISP (BAG)
BANDAGE ESMARK 6X9 LF (GAUZE/BANDAGES/DRESSINGS) ×1 IMPLANT
BASE TIBIAL ROT PLAT SZ 8 KNEE (Knees) IMPLANT
BLADE SAG 18X100X1.27 (BLADE) ×1 IMPLANT
BLADE SAW SGTL 11.0X1.19X90.0M (BLADE) IMPLANT
BNDG CMPR 9X6 STRL LF SNTH (GAUZE/BANDAGES/DRESSINGS)
BNDG CMPR MD 5X2 ELC HKLP STRL (GAUZE/BANDAGES/DRESSINGS) ×1
BNDG ELASTIC 2 VLCR STRL LF (GAUZE/BANDAGES/DRESSINGS) IMPLANT
BNDG ELASTIC 6X5.8 VLCR STR LF (GAUZE/BANDAGES/DRESSINGS) ×2 IMPLANT
BNDG ESMARK 6X9 LF (GAUZE/BANDAGES/DRESSINGS)
BOWL SMART MIX CTS (DISPOSABLE) IMPLANT
BSPLAT TIB 8 CMNT ROT PLAT STR (Knees) ×1 IMPLANT
CEMENT HV SMART SET (Cement) IMPLANT
COOLER ICEMAN CLASSIC (MISCELLANEOUS) ×1 IMPLANT
COVER SURGICAL LIGHT HANDLE (MISCELLANEOUS) ×1 IMPLANT
CUFF TOURN SGL QUICK 34 (TOURNIQUET CUFF) ×1
CUFF TOURN SGL QUICK 42 (TOURNIQUET CUFF) IMPLANT
CUFF TRNQT CYL 34X4.125X (TOURNIQUET CUFF) ×1 IMPLANT
DRAPE EXTREMITY T 121X128X90 (DISPOSABLE) ×1 IMPLANT
DRAPE HALF SHEET 40X57 (DRAPES) ×1 IMPLANT
DRAPE U-SHAPE 47X51 STRL (DRAPES) ×1 IMPLANT
DURAPREP 26ML APPLICATOR (WOUND CARE) ×1 IMPLANT
ELECT CAUTERY BLADE 6.4 (BLADE) ×1 IMPLANT
ELECT REM PT RETURN 9FT ADLT (ELECTROSURGICAL) ×1
ELECTRODE REM PT RTRN 9FT ADLT (ELECTROSURGICAL) ×1 IMPLANT
FACESHIELD WRAPAROUND (MASK) ×3 IMPLANT
FACESHIELD WRAPAROUND OR TEAM (MASK) ×2 IMPLANT
GAUZE PAD ABD 8X10 STRL (GAUZE/BANDAGES/DRESSINGS) ×1 IMPLANT
GAUZE SPONGE 4X4 12PLY STRL (GAUZE/BANDAGES/DRESSINGS) ×1 IMPLANT
GAUZE XEROFORM 1X8 LF (GAUZE/BANDAGES/DRESSINGS) ×1 IMPLANT
GLOVE BIOGEL PI IND STRL 8 (GLOVE) ×2 IMPLANT
GLOVE ORTHO TXT STRL SZ7.5 (GLOVE) ×1 IMPLANT
GLOVE SURG ORTHO 8.0 STRL STRW (GLOVE) ×1 IMPLANT
GOWN STRL REUS W/ TWL LRG LVL3 (GOWN DISPOSABLE) IMPLANT
GOWN STRL REUS W/ TWL XL LVL3 (GOWN DISPOSABLE) ×2 IMPLANT
GOWN STRL REUS W/TWL LRG LVL3 (GOWN DISPOSABLE)
GOWN STRL REUS W/TWL XL LVL3 (GOWN DISPOSABLE) ×2
HANDPIECE INTERPULSE COAX TIP (DISPOSABLE) ×1
IMMOBILIZER KNEE 22 UNIV (SOFTGOODS) ×1 IMPLANT
IV NS 1000ML (IV SOLUTION) ×1
IV NS 1000ML BAXH (IV SOLUTION) ×1 IMPLANT
KIT BASIN OR (CUSTOM PROCEDURE TRAY) ×1 IMPLANT
KIT TURNOVER KIT B (KITS) ×1 IMPLANT
MANIFOLD NEPTUNE II (INSTRUMENTS) ×1 IMPLANT
NDL 18GX1X1/2 (RX/OR ONLY) (NEEDLE) IMPLANT
NEEDLE 18GX1X1/2 (RX/OR ONLY) (NEEDLE) IMPLANT
NS IRRIG 1000ML POUR BTL (IV SOLUTION) ×1 IMPLANT
PACK TOTAL JOINT (CUSTOM PROCEDURE TRAY) ×1 IMPLANT
PAD ABD 8X10 STRL (GAUZE/BANDAGES/DRESSINGS) IMPLANT
PAD ARMBOARD 7.5X6 YLW CONV (MISCELLANEOUS) ×1 IMPLANT
PAD CAST 4YDX4 CTTN HI CHSV (CAST SUPPLIES) IMPLANT
PAD COLD SHLDR WRAP-ON (PAD) ×1 IMPLANT
PADDING CAST COTTON 4X4 STRL (CAST SUPPLIES) ×1
PADDING CAST COTTON 6X4 STRL (CAST SUPPLIES) ×1 IMPLANT
PIN FIX SIGMA LCS THRD HI (PIN) IMPLANT
SET HNDPC FAN SPRY TIP SCT (DISPOSABLE) ×1 IMPLANT
SET PAD KNEE POSITIONER (MISCELLANEOUS) ×1 IMPLANT
STAPLER VISISTAT 35W (STAPLE) ×1 IMPLANT
SUCTION TUBE FRAZIER 10FR DISP (SUCTIONS) ×1 IMPLANT
SUT VIC AB 0 CT1 27 (SUTURE) ×2
SUT VIC AB 0 CT1 27XBRD ANBCTR (SUTURE) ×1 IMPLANT
SUT VIC AB 1 CT1 27 (SUTURE) ×2
SUT VIC AB 1 CT1 27XBRD ANBCTR (SUTURE) ×2 IMPLANT
SUT VIC AB 2-0 CT1 27 (SUTURE) ×2
SUT VIC AB 2-0 CT1 TAPERPNT 27 (SUTURE) ×2 IMPLANT
SYR 50ML LL SCALE MARK (SYRINGE) IMPLANT
TIBIAL BASE ROT PLAT SZ 8 KNEE (Knees) ×1 IMPLANT
TOWEL GREEN STERILE (TOWEL DISPOSABLE) ×1 IMPLANT
TOWEL GREEN STERILE FF (TOWEL DISPOSABLE) ×1 IMPLANT
TRAY CATH INTERMITTENT SS 16FR (CATHETERS) IMPLANT

## 2023-09-19 NOTE — Anesthesia Postprocedure Evaluation (Signed)
Anesthesia Post Note  Patient: Harold Smith  Procedure(s) Performed: RIGHT TOTAL KNEE ARTHROPLASTY (Right: Knee)     Patient location during evaluation: PACU Anesthesia Type: Spinal and Regional Level of consciousness: oriented and awake and alert Pain management: pain level controlled Vital Signs Assessment: post-procedure vital signs reviewed and stable Respiratory status: spontaneous breathing and respiratory function stable Cardiovascular status: blood pressure returned to baseline and stable Postop Assessment: no headache, no backache, no apparent nausea or vomiting, spinal receding and patient able to bend at knees Anesthetic complications: no  No notable events documented.  Last Vitals:  Vitals:   09/19/23 1615 09/19/23 1630  BP: (!) 153/91 (!) 148/92  Pulse: (!) 59 61  Resp: 12 12  Temp: (!) 36.2 C   SpO2: 96% 94%    Last Pain:  Vitals:   09/19/23 1720  TempSrc:   PainSc: 9                  Zayde Stroupe,W. EDMOND

## 2023-09-19 NOTE — Op Note (Signed)
Operative Note  Date of operation: 09/19/2023 Preoperative diagnosis: Right knee posttraumatic arthritis Postoperative diagnosis: Same  Procedure: Right cemented total knee arthroplasty  Implants: DePuy attune cemented knee system Implant Name Type Inv. Item Serial No. Manufacturer Lot No. LRB No. Used Action  CEMENT HV SMART SET - [id number] Cement CEMENT HV SMART SET  [employer] [id number] Right 2 Implanted  ATTUNE PSRP INSR SZ6 8 KNEE - [id number] Insert ATTUNE PSRP INSR SZ6 8 KNEE  [employer] [id number] Right 1 Implanted  ATTUNE MED DOME PAT 38 KNEE - [id number] Knees ATTUNE MED DOME PAT 38 KNEE  [employer]PU[employer] [id number] Right 1 Implanted  ATTUNE PS FEM RT SZ 6 CEM KNEE - [id number] Femur ATTUNE PS FEM RT SZ 6 CEM KNEE  [employer] [id number] Right 1 Implanted  BSPLAT TIB 8 CMNT ROT PLAT STR - [id number] Knees BSPLAT TIB 8 CMNT ROT PLAT STR  [employer] [id number] Right 1 Implanted   Surgeon: Vanita Panda. Magnus Ivan, MD Assistant: Rexene Edison, PA-C  Anesthesia: #1 right lower extremity adductor canal block, #2 spinal, #3 local Tourniquet time: Under 1 hour EBL: Under 100 cc Antibiotics: IV Ancef Complications: None  Indications: The patient is a 57 year old gentleman who has a history of a right distal femur fracture that occurred over a year ago.  This involved mainly the medial femoral condyle.  He eventually had a plate and screws removed from his medial femoral condyle and a MRI was obtained showing significant arthritis in his right knee.  I believe this was confirmed under arthroscopic guidance as well.  At this point he has tried and failed conservative treatment for many months now.  His right knee pain is daily.  He has varus malalignment of that knee.  It is detrimentally affecting his mobility, his quality of life and his actives daily living to the point he wishes to proceed with a knee replacement.  He does ambulate with a cane as well.  He has tried  and failed all forms conservative treatment.  We did talk about the risks of acute blood loss anemia, nerve vessel injury, fracture, infection, DVT and implant failure.  He understands that our goals are hopefully decrease pain, improve mobility and improve quality of life.  Procedure description: After informed consent was obtained and the appropriate right knee was marked, anesthesia obtained a right lower extremity adductor canal block in the holding room.  The patient was then brought to the operating room and set up on the operating table where spinal anesthesia was obtained.  He was then laid in supine position on the operating table and a nonsterile turn was placed on his upper right thigh.  His right thigh, knee, leg and ankle as well as his foot were prepped and draped with DuraPrep and sterile drapes.  A timeout was called and he was identified as the correct patient and the correct right knee.  An Esmarch was used to wrap out the leg and the tourniquet was inflated to 300 mm of pressure.  We the knee extended we then made a midline incision over the patella and carried this proximally distally.  Dissection was carried down to the knee joint and a medial parapatellar arthrotomy was made.  With the knee in a flexed position we found significant cartilage wear of the medial compartment, the lateral compartment and the patellofemoral joint.  We removed osteophytes from all 3 compartments as well as remnants of the medial and lateral meniscus and the ACL and PCL.  Using  extramedullary cutting guide with the knee flexed we made our proximal tibia cut using the DePuy attune system.  We made this cut to correct for varus and valgus and neutral slope.  This was also designed to take 2 mm off the low side.  We made that cut without difficulty.  We then used a intramedullary based cutting guide for distal femur cut setting this for right knee at 5 degrees externally rotated.  We made this cut for 10 mm.  We then  brought the knee back down to full extension and removed more access meniscus from the back of the knee and then placed a 10 mm extension block and we had achieved full extension.  We then back to the femur and put a femoral sizing guide based off the epicondylar axis and Whitesides line.  Based off of this we chose a size 6 femur.  We put a 4-in-1 cutting block for a size 6 femur and made our anterior and posterior cuts followed our chamfer cuts.  We then made our femoral box cut.  Attention was then turned back to the tibia.  We chose a size 8 tibial tray for coverage over the tibial plateau setting the rotation of the tibial tubercle and the femur.  We did our drill hole and keel punch off of this.  We then trialed our size 8 right tibia followed by our size 6 right PS femur.  We went up to an 8 mm thickness polyethylene insert.  This was an RP insert.  We are pleased with range of motion and stability with these implants.  We then made a patella gotten and drilled 3 holes for a size 38 patella button.  We then again put the knee through several cycles of motion with all implants in the knee and we are pleased with stability of those components.  We then removed all trial implants from the knee and and irrigate the knee with normal saline solution.  We then placed Marcaine with epinephrine around the arthrotomy.  The cement was mixed in with the knee in a flexed position we cemented our DePuy attune tibial tray for a right knee size 8 followed by cementing our size 8 right PS standard femur.  We placed our 8 mm thickness RP PS polyethylene insert and cemented our size 38 patella button.  We then held the knee extended and completely compressed.  Of note during the case we found no evidence of fracture going through the medial femoral condyle.  It was suggestive on the MRI and we are prepared to use stem inserts for the femur but that did not and that being the case based on what we could see intraoperative.  Once  the cemented hardened the tourniquet was let down and hemostasis was obtained electrocautery.  The arthrotomy was then closed with interrupted #1 Vicryl suture followed by 0 Vicryl close deep tissue and 2-0 Vicryl to close subcutaneous tissue.  The skin was closed with staples.  Well-padded sterile dressing was applied.  The patient was taken the recovery room in stable condition.  Rexene Edison, PA-C did assist during the entire case and beginning to end and his assistance was medically necessary and crucial for soft tissue management and retraction, helping guide implant placement and a layered closure of the wound.

## 2023-09-19 NOTE — Interval H&P Note (Signed)
History and Physical Interval Note: The patient understands that he is here today for right total knee replacement to treat his significant right knee posttraumatic arthritis.  There has been no acute or interval change in his medical status.  The risks and benefits of surgery been discussed in detail and informed consent has been obtained.  The right operative knee has been marked.  09/19/2023 11:58 AM  Harold Smith  has presented today for surgery, with the diagnosis of post traumatic arthritis right knee.  The various methods of treatment have been discussed with the patient and family. After consideration of risks, benefits and other options for treatment, the patient has consented to  Procedure(s): RIGHT TOTAL KNEE ARTHROPLASTY (Right) as a surgical intervention.  The patient's history has been reviewed, patient examined, no change in status, stable for surgery.  I have reviewed the patient's chart and labs.  Questions were answered to the patient's satisfaction.     Kathryne Hitch

## 2023-09-19 NOTE — Transfer of Care (Signed)
Immediate Anesthesia Transfer of Care Note  Patient: Harold Smith  Procedure(s) Performed: RIGHT TOTAL KNEE ARTHROPLASTY (Right: Knee)  Patient Location: PACU  Anesthesia Type:Spinal  Level of Consciousness: awake  Airway & Oxygen Therapy: Patient Spontanous Breathing and Patient connected to face mask oxygen  Post-op Assessment: Report given to RN and Post -op Vital signs reviewed and stable  Post vital signs: Reviewed and stable  Last Vitals:  Vitals Value Taken Time  BP 154/90 09/19/23 1535  Temp    Pulse 60 09/19/23 1537  Resp 11 09/19/23 1537  SpO2 97 % 09/19/23 1537  Vitals shown include unfiled device data.  Last Pain:  Vitals:   09/19/23 1535  TempSrc:   PainSc: Asleep         Complications: No notable events documented.

## 2023-09-19 NOTE — Anesthesia Procedure Notes (Signed)
Spinal  Patient location during procedure: OR Start time: 09/19/2023 1:25 PM End time: 09/19/2023 1:30 PM Reason for block: surgical anesthesia Staffing Performed: anesthesiologist  Anesthesiologist: Gaynelle Adu, MD Performed by: Gaynelle Adu, MD Authorized by: Gaynelle Adu, MD   Preanesthetic Checklist Completed: patient identified, IV checked, risks and benefits discussed, surgical consent, monitors and equipment checked, pre-op evaluation and timeout performed Spinal Block Patient position: sitting Prep: DuraPrep Patient monitoring: cardiac monitor, continuous pulse ox and blood pressure Approach: midline Location: L3-4 Injection technique: single-shot Needle Needle type: Quincke  Needle gauge: 22 G Needle length: 9 cm Assessment Sensory level: T8 Events: CSF return Additional Notes Functioning IV was confirmed and monitors were applied. Sterile prep and drape, including hand hygiene and sterile gloves were used. The patient was positioned and the spine was prepped. The skin was anesthetized with lidocaine.  Free flow of clear CSF was obtained prior to injecting local anesthetic into the CSF.  The spinal needle aspirated freely following injection.  The needle was carefully withdrawn.  The patient tolerated the procedure well.

## 2023-09-19 NOTE — Plan of Care (Signed)
  Problem: Pain Managment: Goal: General experience of comfort will improve Outcome: Progressing   Problem: Safety: Goal: Ability to remain free from injury will improve Outcome: Progressing   

## 2023-09-19 NOTE — Anesthesia Procedure Notes (Signed)
Anesthesia Regional Block: Adductor canal block   Pre-Anesthetic Checklist: , timeout performed,  Correct Patient, Correct Site, Correct Laterality,  Correct Procedure, Correct Position, site marked,  Risks and benefits discussed,  Pre-op evaluation,  At surgeon's request and post-op pain management  Laterality: Right  Prep: Maximum Sterile Barrier Precautions used, chloraprep       Needles:  Injection technique: Single-shot  Needle Type: Echogenic Stimulator Needle     Needle Length: 9cm  Needle Gauge: 21     Additional Needles:   Procedures:,,,, ultrasound used (permanent image in chart),,    Narrative:  Start time: 09/19/2023 11:55 AM End time: 09/19/2023 12:05 PM Injection made incrementally with aspirations every 5 mL.  Performed by: Personally  Anesthesiologist: Gaynelle Adu, MD

## 2023-09-20 ENCOUNTER — Telehealth: Payer: Self-pay | Admitting: Orthopaedic Surgery

## 2023-09-20 ENCOUNTER — Encounter (HOSPITAL_COMMUNITY): Payer: Self-pay | Admitting: Orthopaedic Surgery

## 2023-09-20 DIAGNOSIS — I10 Essential (primary) hypertension: Secondary | ICD-10-CM | POA: Diagnosis not present

## 2023-09-20 DIAGNOSIS — F17290 Nicotine dependence, other tobacco product, uncomplicated: Secondary | ICD-10-CM | POA: Diagnosis not present

## 2023-09-20 DIAGNOSIS — Z79899 Other long term (current) drug therapy: Secondary | ICD-10-CM | POA: Diagnosis not present

## 2023-09-20 DIAGNOSIS — M1731 Unilateral post-traumatic osteoarthritis, right knee: Secondary | ICD-10-CM | POA: Diagnosis not present

## 2023-09-20 LAB — GLUCOSE, CAPILLARY: Glucose-Capillary: 115 mg/dL — ABNORMAL HIGH (ref 70–99)

## 2023-09-20 LAB — BASIC METABOLIC PANEL
Anion gap: 10 (ref 5–15)
BUN: 15 mg/dL (ref 6–20)
CO2: 22 mmol/L (ref 22–32)
Calcium: 8.4 mg/dL — ABNORMAL LOW (ref 8.9–10.3)
Chloride: 102 mmol/L (ref 98–111)
Creatinine, Ser: 0.81 mg/dL (ref 0.61–1.24)
GFR, Estimated: >60 mL/min (ref >60)
Glucose, Bld: 148 mg/dL — ABNORMAL HIGH (ref 70–99)
Potassium: 3.8 mmol/L (ref 3.5–5.1)
Sodium: 134 mmol/L — ABNORMAL LOW (ref 135–145)

## 2023-09-20 LAB — CBC
HCT: 34 % — ABNORMAL LOW (ref 39.0–52.0)
Hemoglobin: 11.7 g/dL — ABNORMAL LOW (ref 13.0–17.0)
MCH: 32.2 pg (ref 26.0–34.0)
MCHC: 34.4 g/dL (ref 30.0–36.0)
MCV: 93.7 fL (ref 80.0–100.0)
Platelets: 235 10*3/uL (ref 150–400)
RBC: 3.63 MIL/uL — ABNORMAL LOW (ref 4.22–5.81)
RDW: 15.3 % (ref 11.5–15.5)
WBC: 13.4 10*3/uL — ABNORMAL HIGH (ref 4.0–10.5)
nRBC: 0 % (ref 0.0–0.2)

## 2023-09-20 MED ORDER — HYDRALAZINE HCL 20 MG/ML IJ SOLN
10.0000 mg | Freq: Four times a day (QID) | INTRAMUSCULAR | Status: DC | PRN
  Administered 2023-09-20: 10 mg via INTRAVENOUS
  Filled 2023-09-20: qty 1

## 2023-09-20 MED ORDER — KETOROLAC TROMETHAMINE 15 MG/ML IJ SOLN
7.5000 mg | Freq: Four times a day (QID) | INTRAMUSCULAR | Status: AC
  Administered 2023-09-20 (×3): 7.5 mg via INTRAVENOUS
  Filled 2023-09-20 (×3): qty 1

## 2023-09-20 NOTE — Discharge Instructions (Signed)

## 2023-09-20 NOTE — Telephone Encounter (Signed)
[name] called from [place] said she needs orders for CT contrast faxed to [facility]. The faxed [phone]. And a good CB#[phone] for [name]

## 2023-09-20 NOTE — Progress Notes (Signed)
Pt voiding post op. 1325 ml for urine output.

## 2023-09-20 NOTE — Evaluation (Signed)
Physical Therapy Evaluation Patient Details Name: Toderick Ziman MRN: [id number] DOB: 05-Mar-1966 Today's Date: 09/20/2023  History of Present Illness  57yo M who received R TKR on 09/19/23. PMH chronic pain syndrome, anxiety, metabolic bone disease, hx R femur fx, mood disorder, lumbar disc degeneration, sleep disturbance, HTN, IBS, achilles tendon repair, lumbar fusion, hernia repair, R knee arthroscopy, R femur ORIF, rotator cuff repair  Clinical Impression   Pt received in bed, pleasant and cooperative with PT today. BP much better this afternoon but session limited by pain and fatigue. Able to mobilize in room very well, no significant concerns noted today. AROM seated up in recliner approximately 65 degrees flexion to -20 degrees extension.  Positioned to comfort with all needs met, will continue to follow- hopefully he will be feeling significantly better tomorrow and we will be able to progress mobility as appropriate.       If plan is discharge home, recommend the following: A little help with walking and/or transfers;Assistance with cooking/housework;A little help with bathing/dressing/bathroom;Help with stairs or ramp for entrance   Can travel by private vehicle        Equipment Recommendations Other (comment) (shower seat)  Recommendations for Other Services       Functional Status Assessment Patient has had a recent decline in their functional status and demonstrates the ability to make significant improvements in function in a reasonable and predictable amount of time.     Precautions / Restrictions Precautions Precautions: Fall;Other (comment) Precaution Comments: watch BP Restrictions Weight Bearing Restrictions: Yes RLE Weight Bearing: Weight bearing as tolerated      Mobility  Bed Mobility Overal bed mobility: Modified Independent             General bed mobility comments: HOB elevated    Transfers Overall transfer level: Needs assistance Equipment  used: Rolling walker (2 wheels) Transfers: Sit to/from Stand Sit to Stand: Supervision           General transfer comment: good sequencing noted, no physical assist given    Ambulation/Gait Ambulation/Gait assistance: Supervision Gait Distance (Feet): 10 Feet Assistive device: Rolling walker (2 wheels) Gait Pattern/deviations: Step-through pattern, Decreased step length - left, Decreased stance time - right, Decreased weight shift to right, Antalgic Gait velocity: decreased     General Gait Details: limited gait distance due to knee pain despite recent pain meds, steady with RW  Stairs            Wheelchair Mobility     Tilt Bed    Modified Rankin (Stroke Patients Only)       Balance Overall balance assessment: No apparent balance deficits (not formally assessed)                                           Pertinent Vitals/Pain Pain Assessment Pain Assessment: 0-10 Pain Score: 10-Worst pain ever Pain Location: R knee Pain Descriptors / Indicators: Discomfort, Throbbing, Aching Pain Intervention(s): Limited activity within patient's tolerance, Monitored during session, Premedicated before session, Repositioned    Home Living Family/patient expects to be discharged to:: Private residence Living Arrangements: Children (daughter and grandchilden) Available Help at Discharge: Family;Available 24 hours/day Type of Home: House Home Access: Stairs to enter   [employer] of Steps: 1 + threshold through front door, one step through garage entrance   Home Layout: Two level;Able to live on main level with bedroom/bathroom Home  Equipment: Gilmer Mor - single Information systems manager (2 wheels) Additional Comments: daughter will able to help him through the school year    Prior Function Prior Level of Function : Independent/Modified Independent;Driving             Mobility Comments: using cane time to time, was working out and  exercising etc       Extremity/Trunk Assessment   Upper Extremity Assessment Upper Extremity Assessment: Overall WFL for tasks assessed    Lower Extremity Assessment Lower Extremity Assessment: Overall WFL for tasks assessed (surgical LE appropriately weak post-op)    Cervical / Trunk Assessment Cervical / Trunk Assessment: Normal  Communication   Communication Communication: No apparent difficulties  Cognition Arousal: Alert Behavior During Therapy: WFL for tasks assessed/performed Overall Cognitive Status: Within Functional Limits for tasks assessed                                          General Comments      Exercises     Assessment/Plan    PT Assessment Patient needs continued PT services  PT Problem List Decreased strength;Pain;Decreased range of motion;Decreased mobility;Decreased activity tolerance;Decreased coordination       PT Treatment Interventions DME instruction;Therapeutic activities;Gait training;Therapeutic exercise;Patient/family education;Stair training;Balance training;Functional mobility training;Neuromuscular re-education    PT Goals (Current goals can be found in the Care Plan section)  Acute Rehab PT Goals Patient Stated Goal: less pain, go home when able PT Goal Formulation: With patient Time For Goal Achievement: 10/04/23 Potential to Achieve Goals: Good    Frequency 7X/week     Co-evaluation               AM-PAC PT "6 Clicks" Mobility  Outcome Measure Help needed turning from your back to your side while in a flat bed without using bedrails?: None Help needed moving from lying on your back to sitting on the side of a flat bed without using bedrails?: None Help needed moving to and from a bed to a chair (including a wheelchair)?: A Little Help needed standing up from a chair using your arms (e.g., wheelchair or bedside chair)?: A Little Help needed to walk in hospital room?: A Little Help needed climbing  3-5 steps with a railing? : A Little 6 Click Score: 20    End of Session   Activity Tolerance: Patient tolerated treatment well;Patient limited by pain;Patient limited by fatigue Patient left: in chair;with call bell/phone within reach Nurse Communication: Mobility status PT Visit Diagnosis: Muscle weakness (generalized) (M62.81);Difficulty in walking, not elsewhere classified (R26.2);Pain Pain - Right/Left: Right Pain - part of body: Knee    Time: 1430-1446 PT Time Calculation (min) (ACUTE ONLY): 16 min   Charges:   PT Evaluation $PT Eval Moderate Complexity: 1 Mod   PT General Charges $$ ACUTE PT VISIT: 1 Visit        Nedra Hai, PT, DPT 09/20/23 2:57 PM

## 2023-09-20 NOTE — Progress Notes (Signed)
PT Cancellation Note  Patient Details Name: Harold Smith MRN: [id number] DOB: 31-Dec-1965   Cancelled Treatment:    Reason Eval/Treat Not Completed: Medical issues which prohibited therapy actively monitoring pt from a distance- BP has been quite high which prevents safe PT mobilization (167/126 at 5am, 138/115 at 845am). Will continue to follow and attempt PT eval when medically ready.   Nedra Hai, PT, DPT 09/20/23 9:39 AM

## 2023-09-20 NOTE — Plan of Care (Signed)
  Problem: Education: Goal: Knowledge of General Education information will improve Description: Including pain rating scale, medication(s)/side effects and non-pharmacologic comfort measures Outcome: Progressing   Problem: Clinical Measurements: Goal: Ability to maintain clinical measurements within normal limits will improve Outcome: Progressing Goal: Will remain free from infection Outcome: Progressing Goal: Diagnostic test results will improve Outcome: Progressing Goal: Respiratory complications will improve Outcome: Progressing Goal: Cardiovascular complication will be avoided Outcome: Progressing   Problem: Safety: Goal: Ability to remain free from injury will improve Outcome: Progressing   Problem: Skin Integrity: Goal: Risk for impaired skin integrity will decrease Outcome: Progressing   Problem: Skin Integrity: Goal: Will show signs of wound healing Outcome: Progressing

## 2023-09-20 NOTE — Progress Notes (Signed)
Subjective: 1 Day Post-Op Procedure(s) (LRB): RIGHT TOTAL KNEE ARTHROPLASTY (Right) Patient reports pain as moderate.    Objective: Vital signs in last 24 hours: Temp:  [97 F (36.1 C)-98.2 F (36.8 C)] 98.2 F (36.8 C) (09/24 2026) Pulse Rate:  [59-94] 77 (09/25 0500) Resp:  [12-18] 16 (09/25 0500) BP: (125-200)/(83-126) 167/126 (09/25 0500) SpO2:  [94 %-100 %] 100 % (09/25 0500) Weight:  [79.4 kg] 79.4 kg (09/24 1021)  Intake/Output from previous day: 09/24 0701 - 09/25 0700 In: 1419.9 [I.V.:1369.9; IV Piggyback:50] Out: 2225 [Urine:2125; Blood:100] Intake/Output this shift: No intake/output data recorded.  Recent Labs    09/19/23 1043 09/20/23 0342  HGB 13.3 11.7*   Recent Labs    09/19/23 1043 09/20/23 0342  WBC  --  13.4*  RBC  --  3.63*  HCT 39.0 34.0*  PLT  --  235   Recent Labs    09/19/23 1043 09/20/23 0342  NA 140 134*  K 3.7 3.8  CL 106 102  CO2  --  22  BUN 17 15  CREATININE 0.70 0.81  GLUCOSE 164* 148*  CALCIUM  --  8.4*   No results for input(s): "LABPT", "INR" in the last 72 hours.  Sensation intact distally Intact pulses distally Dorsiflexion/Plantar flexion intact Incision: moderate drainage Compartment soft   Assessment/Plan: 1 Day Post-Op Procedure(s) (LRB): RIGHT TOTAL KNEE ARTHROPLASTY (Right) Up with therapy      Kathryne Hitch 09/20/2023, 7:37 AM

## 2023-09-21 ENCOUNTER — Ambulatory Visit: Payer: 59

## 2023-09-21 ENCOUNTER — Telehealth: Payer: Self-pay

## 2023-09-21 DIAGNOSIS — I10 Essential (primary) hypertension: Secondary | ICD-10-CM | POA: Diagnosis not present

## 2023-09-21 DIAGNOSIS — Z79899 Other long term (current) drug therapy: Secondary | ICD-10-CM | POA: Diagnosis not present

## 2023-09-21 DIAGNOSIS — M1731 Unilateral post-traumatic osteoarthritis, right knee: Secondary | ICD-10-CM | POA: Diagnosis not present

## 2023-09-21 DIAGNOSIS — F17290 Nicotine dependence, other tobacco product, uncomplicated: Secondary | ICD-10-CM | POA: Diagnosis not present

## 2023-09-21 MED ORDER — ASPIRIN 81 MG PO CHEW: 81.0000 mg | CHEWABLE_TABLET | Freq: Two times a day (BID) | ORAL | 0 refills | Status: DC

## 2023-09-21 MED ORDER — HYDROMORPHONE HCL 4 MG PO TABS: 4.0000 mg | ORAL_TABLET | ORAL | 0 refills | Status: DC | PRN

## 2023-09-21 NOTE — Plan of Care (Signed)
  Problem: Education: Goal: Knowledge of General Education information will improve Description: Including pain rating scale, medication(s)/side effects and non-pharmacologic comfort measures Outcome: Progressing   Problem: Health Behavior/Discharge Planning: Goal: Ability to manage health-related needs will improve Outcome: Progressing   Problem: Clinical Measurements: Goal: Ability to maintain clinical measurements within normal limits will improve Outcome: Progressing Goal: Will remain free from infection Outcome: Progressing Goal: Diagnostic test results will improve Outcome: Progressing Goal: Respiratory complications will improve Outcome: Progressing Goal: Cardiovascular complication will be avoided Outcome: Progressing   Problem: Pain Managment: Goal: General experience of comfort will improve Outcome: Progressing   Problem: Safety: Goal: Ability to remain free from injury will improve Outcome: Progressing   Problem: Skin Integrity: Goal: Will show signs of wound healing Outcome: Progressing

## 2023-09-21 NOTE — Progress Notes (Signed)
Subjective: 2 Days Post-Op Procedure(s) (LRB): RIGHT TOTAL KNEE ARTHROPLASTY (Right) Patient reports pain as moderate.    Objective: Vital signs in last 24 hours: Temp:  [98 F (36.7 C)-99.4 F (37.4 C)] 98.8 F (37.1 C) (09/26 0817) Pulse Rate:  [85-98] 95 (09/26 0817) Resp:  [17-18] 17 (09/26 0817) BP: (112-168)/(66-118) 124/71 (09/26 0817) SpO2:  [93 %-98 %] 95 % (09/26 0817)  Intake/Output from previous day: 09/25 0701 - 09/26 0700 In: 240 [P.O.:240] Out: 1500 [Urine:1500] Intake/Output this shift: No intake/output data recorded.  Recent Labs    09/19/23 1043 09/20/23 0342  HGB 13.3 11.7*   Recent Labs    09/19/23 1043 09/20/23 0342  WBC  --  13.4*  RBC  --  3.63*  HCT 39.0 34.0*  PLT  --  235   Recent Labs    09/19/23 1043 09/20/23 0342  NA 140 134*  K 3.7 3.8  CL 106 102  CO2  --  22  BUN 17 15  CREATININE 0.70 0.81  GLUCOSE 164* 148*  CALCIUM  --  8.4*   No results for input(s): "LABPT", "INR" in the last 72 hours.  Sensation intact distally Intact pulses distally Dorsiflexion/Plantar flexion intact Incision: moderate drainage Compartment soft   Assessment/Plan: 2 Days Post-Op Procedure(s) (LRB): RIGHT TOTAL KNEE ARTHROPLASTY (Right) Advance diet Discharge home with home health this afternoon.      Kathryne Hitch 09/21/2023, 8:37 AM

## 2023-09-21 NOTE — Telephone Encounter (Signed)
FYI-  Per Dewana with [facility], Physical therapist will go out on tomorrow to see patient.  CB# [phone], ext. [phone].  Thank you

## 2023-09-21 NOTE — Progress Notes (Signed)
Physical Therapy Treatment Patient Details Name: Harold Smith MRN: [id number] DOB: Sep 25, 1966 Today's Date: 09/21/2023   History of Present Illness 57yo M who received R TKR on 09/19/23. PMH chronic pain syndrome, anxiety, metabolic bone disease, hx R femur fx, mood disorder, lumbar disc degeneration, sleep disturbance, HTN, IBS, achilles tendon repair, lumbar fusion, hernia repair, R knee arthroscopy, R femur ORIF, rotator cuff repair    PT Comments  Pt received at EOB, reviewing TKR HEP handout from the hospital, politely declines review or practice of these exercises as he is familiar with them- had been doing them at prehab PT prior to his surgery. Continued to progress gait distance today, did much better than this morning. Left sitting at EOB with all needs met, no PT barriers to DC at this time.     If plan is discharge home, recommend the following: A little help with walking and/or transfers;Assistance with cooking/housework;A little help with bathing/dressing/bathroom;Help with stairs or ramp for entrance   Can travel by private vehicle        Equipment Recommendations  Other (comment) (shower seat)    Recommendations for Other Services       Precautions / Restrictions Precautions Precautions: Fall;Other (comment) Precaution Comments: watch BP Restrictions Weight Bearing Restrictions: Yes RLE Weight Bearing: Weight bearing as tolerated     Mobility  Bed Mobility Overal bed mobility: Modified Independent             General bed mobility comments: sitting at EOB upon entry    Transfers Overall transfer level: Needs assistance Equipment used: Rolling walker (2 wheels) Transfers: Sit to/from Stand Sit to Stand: Modified independent (Device/Increase time)           General transfer comment: good sequencing noted, no physical assist given    Ambulation/Gait Ambulation/Gait assistance: Supervision Gait Distance (Feet): 180 Feet Assistive device:  Rolling walker (2 wheels) Gait Pattern/deviations: Step-through pattern, Decreased step length - left, Decreased stance time - right, Decreased weight shift to right, Antalgic Gait velocity: decreased     General Gait Details: slow but steady with RW this afternoon, gait pattern much improved with better step lengths/stance time R LE; did at times get impulsive with RW trying to squeeze through a tight space to answer phone and needed min guard in this situation   Stairs             Wheelchair Mobility     Tilt Bed    Modified Rankin (Stroke Patients Only)       Balance Overall balance assessment: No apparent balance deficits (not formally assessed)                                          Cognition Arousal: Alert Behavior During Therapy: WFL for tasks assessed/performed Overall Cognitive Status: Within Functional Limits for tasks assessed                                          Exercises Total Joint Exercises Goniometric ROM: extension AROM supine approximately -15*, flexion AAROM supine approximately 60-70*    General Comments        Pertinent Vitals/Pain Pain Assessment Pain Assessment: Faces Pain Score: 9  Faces Pain Scale: Hurts even more Pain Location: R knee Pain Descriptors / Indicators: Discomfort, Throbbing,  Aching Pain Intervention(s): Limited activity within patient's tolerance, Monitored during session, Premedicated before session    Home Living                          Prior Function            PT Goals (current goals can now be found in the care plan section) Acute Rehab PT Goals Patient Stated Goal: less pain, go home when able PT Goal Formulation: With patient Time For Goal Achievement: 10/04/23 Potential to Achieve Goals: Good Progress towards PT goals: Progressing toward goals    Frequency    7X/week      PT Plan      Co-evaluation              AM-PAC PT "6 Clicks"  Mobility   Outcome Measure  Help needed turning from your back to your side while in a flat bed without using bedrails?: None Help needed moving from lying on your back to sitting on the side of a flat bed without using bedrails?: None Help needed moving to and from a bed to a chair (including a wheelchair)?: None Help needed standing up from a chair using your arms (e.g., wheelchair or bedside chair)?: None Help needed to walk in hospital room?: A Little Help needed climbing 3-5 steps with a railing? : A Little 6 Click Score: 22    End of Session   Activity Tolerance: Patient tolerated treatment well Patient left: in bed;with call bell/phone within reach (sitting at EOB) Nurse Communication: Mobility status PT Visit Diagnosis: Muscle weakness (generalized) (M62.81);Difficulty in walking, not elsewhere classified (R26.2);Pain Pain - Right/Left: Right Pain - part of body: Knee     Time: 4098-1191 PT Time Calculation (min) (ACUTE ONLY): 14 min  Charges:    $Gait Training: 8-22 mins PT General Charges $$ ACUTE PT VISIT: 1 Visit                    Nedra Hai, PT, DPT 09/21/23 2:07 PM

## 2023-09-21 NOTE — TOC Transition Note (Signed)
Transition of Care [facility]) - CM/SW Discharge Note   Patient Details  Name: Harold Smith MRN: [id number] Date of Birth: 1966-04-04  Transition of Care [facility]) CM/SW Contact:  Epifanio Lesches, RN Phone Number: 09/21/2023, 9:48 AM   Clinical Narrative:     Patient will DC to: home Anticipated DC date: 09/21/2023 Family notified: yes Transport by: car            - R TKR on 09/19/23  Per MD patient ready for DC today pending clearance from PT . RN, patient, patient's family, and [facility] notified of DC. Daughter to assist with care one home. Pt already with RW @ home. Referral made with [employer] for 3 IN 1/bsc. Equipment will be provided to pt prior to d/c. Pt without RX med concerns. Pt will pick up meds from local pharmacy.  Post hospital f/u noted on AVS.  Daughter to provide transportation to home. Harold Smith Daughter Emergency Contact [phone]   RNCM will sign off for now as intervention is no longer needed. Please consult [place] again if new needs arise.   Final next level of care: Home w Home Health Services Barriers to Discharge: No Barriers Identified   Patient Goals and CMS Choice   Choice offered to / list presented to : Patient  Discharge Placement                         Discharge Plan and Services Additional resources added to the After Visit Summary for                  DME Arranged: 3-N-1 (Has RW at home) DME Agency: [employer] Date DME Agency Contacted: 09/21/23 Time DME Agency Contacted: [phone] Representative spoke with at DME Agency: Vaughan Basta HH Arranged: PT HH Agency: [facility] Date [facility] Agency Contacted: 09/21/23 Time HH Agency Contacted: [phone] Representative spoke with at [facility] Agency: Haywood Lasso  Social Determinants of Health (SDOH) Interventions SDOH Screenings   Food Insecurity: No Food Insecurity (03/06/2023)  Housing: Low Risk  (03/06/2023)  Transportation Needs: No Transportation Needs (03/06/2023)   Utilities: Not At Risk (03/06/2023)  Alcohol Screen: Low Risk  (12/21/2018)  Depression (PHQ2-9): High Risk (07/12/2023)  Tobacco Use: High Risk (09/19/2023)     Readmission Risk Interventions     No data to display

## 2023-09-21 NOTE — Discharge Summary (Signed)
Patient ID: Harold Smith MRN: [id number] DOB/AGE: [birth year] 59 y.o.  Admit date: 09/19/2023 Discharge date: 09/21/2023  Admission Diagnoses:  Principal Problem:   Post-traumatic osteoarthritis of right knee Active Problems:   S/P total knee arthroplasty, right   Status post total right knee replacement   Discharge Diagnoses:  Same  Past Medical History:  Diagnosis Date   Allergic rhinitis due to pollen    Anemia    Anxiety    Chronic pain    Colon polyps    Depression    Diverticulitis large intestine    GERD (gastroesophageal reflux disease)    HTN (hypertension)    Irritable bowel syndrome    Metabolic bone disease 09/01/2022   Obesity    Osteoarthritis, knee    Sleep apnea    does not use cpap, pt states he no longer needed CPAP due to weight loss   Sleep disturbance    Vitamin D deficiency 09/01/2022    Surgeries: Procedure(s): RIGHT TOTAL KNEE ARTHROPLASTY on 09/19/2023   Consultants:   Discharged Condition: Improved  Hospital Course: Harold Smith is an 57 y.o. male who was admitted 09/19/2023 for operative treatment ofPost-traumatic osteoarthritis of right knee. Patient has severe unremitting pain that affects sleep, daily activities, and work/hobbies. After pre-op clearance the patient was taken to the operating room on 09/19/2023 and underwent  Procedure(s): RIGHT TOTAL KNEE ARTHROPLASTY.    Patient was given perioperative antibiotics:  Anti-infectives (From admission, onward)    Start     Dose/Rate Route Frequency Ordered Stop   09/19/23 1745  ceFAZolin (ANCEF) IVPB 1 g/50 mL premix        1 g 100 mL/hr over 30 Minutes Intravenous Every 6 hours 09/19/23 1648 09/20/23 0013   09/19/23 1045  ceFAZolin (ANCEF) IVPB 2g/100 mL premix        2 g 200 mL/hr over 30 Minutes Intravenous On call to O.R. 09/19/23 1033 09/19/23 1330        Patient was given sequential compression devices, early ambulation, and chemoprophylaxis to prevent  DVT.  Patient benefited maximally from hospital stay and there were no complications.    Recent vital signs: Patient Vitals for the past 24 hrs:  BP Temp Temp src Pulse Resp SpO2  09/21/23 0817 124/71 98.8 F (37.1 C) Oral 95 17 95 %  09/21/23 0444 112/66 99.4 F (37.4 C) Oral 98 18 93 %  09/20/23 1950 (!) 140/89 98.8 F (37.1 C) Oral 88 18 98 %     Recent laboratory studies:  Recent Labs    09/19/23 1043 09/20/23 0342  WBC  --  13.4*  HGB 13.3 11.7*  HCT 39.0 34.0*  PLT  --  235  NA 140 134*  K 3.7 3.8  CL 106 102  CO2  --  22  BUN 17 15  CREATININE 0.70 0.81  GLUCOSE 164* 148*  CALCIUM  --  8.4*     Discharge Medications:   Allergies as of 09/21/2023   No Known Allergies      Medication List     STOP taking these medications    oxyCODONE-acetaminophen 5-325 MG tablet Commonly known as: PERCOCET/ROXICET       TAKE these medications    alendronate 70 MG tablet Commonly known as: Fosamax Take 1 tablet (70 mg total) by mouth once a week. Take with a full glass of water on an empty stomach.   aspirin 81 MG chewable tablet Chew 1 tablet (81 mg total) by mouth 2 (  two) times daily.   baclofen 10 MG tablet Commonly known as: LIORESAL Take 1 tablet (10 mg total) by mouth 3 (three) times daily.   CALCIUM PO Take 1 tablet by mouth daily.   carboxymethylcellulose 0.5 % Soln Commonly known as: REFRESH PLUS Place 1 drop into both eyes daily as needed (dry eyes).   cetirizine 10 MG tablet Commonly known as: ZYRTEC Take 10 mg by mouth daily.   Cholecalciferol 125 MCG (5000 UT) Tabs Take 1 tablet by mouth daily. What changed: how much to take   cyclobenzaprine 5 MG tablet Commonly known as: FLEXERIL Take 1-2 tablets (5-10 mg total) by mouth 3 (three) times daily as needed for muscle spasms.   dicyclomine 10 MG capsule Commonly known as: BENTYL Take 1 to 2 capsules as needed for abdominal cramps and diarrhea every 6 to 8 hours   DULoxetine 60 MG  capsule Commonly known as: CYMBALTA TAKE 1 CAPSULE BY MOUTH ONCE DAILY   FeroSul 325 (65 Fe) MG tablet Generic drug: ferrous sulfate TAKE ONE TABLET EACH MORNING WITH BREAKFAST   fluticasone 50 MCG/ACT nasal spray Commonly known as: FLONASE Place 2 sprays into both nostrils daily. What changed:  when to take this reasons to take this   gabapentin 300 MG capsule Commonly known as: NEURONTIN TAKE 1 CAPSULE BY MOUTH TWICE DAILY AND TAKE 4 CAPSULES AT BEDTIME What changed:  how much to take how to take this when to take this reasons to take this additional instructions   hydrochlorothiazide 25 MG tablet Commonly known as: HYDRODIURIL TAKE 1 TABLET BY MOUTH DAILY   hydrocortisone 25 MG suppository Commonly known as: ANUSOL-HC Place 1 suppository (25 mg total) rectally daily.   HYDROmorphone 4 MG tablet Commonly known as: Dilaudid Take 1 tablet (4 mg total) by mouth every 4 (four) hours as needed for severe pain.   lipase/protease/amylase 16109 UNITS Cpep capsule Commonly known as: Creon Take 2 capsules (72,000 Units total) by mouth 3 (three) times daily with meals. May also take 1 capsule (36,000 Units total) as needed (with snacks). What changed: See the new instructions.   lisinopril 20 MG tablet Commonly known as: ZESTRIL TAKE 1 TABLET BY MOUTH DAILY   meloxicam 15 MG tablet Commonly known as: MOBIC TAKE 1 TABLET BY MOUTH DAILY AS NEEDED FOR PAIN   multivitamin tablet Take 1 tablet by mouth daily.   omeprazole 40 MG capsule Commonly known as: PRILOSEC Take 1 capsule (40 mg total) by mouth 2 (two) times daily before a meal.   PREVIDENT 5000 BOOSTER PLUS DT Place 1 application  onto teeth 2 (two) times daily.   QUEtiapine 25 MG tablet Commonly known as: SEROQUEL TAKE 1-2 TABLETS BY MOUTH AT BEDTIME AS NEEDED FOR SLEEP   tadalafil 20 MG tablet Commonly known as: Cialis Take 1 tablet (20 mg total) by mouth daily as needed for erectile dysfunction.    traZODone 50 MG tablet Commonly known as: DESYREL Take 1-2 tablets (50-100 mg total) by mouth at bedtime as needed for sleep.   VITAMIN B-12 PO Take 1 tablet by mouth daily.               Durable Medical Equipment  (From admission, onward)           Start     Ordered   09/19/23 1649  DME 3 n 1  Once        09/19/23 1648   09/19/23 1649  DME Walker rolling  Once  Question Answer Comment  Walker: With 5 Inch Wheels   Patient needs a walker to treat with the following condition Status post total right knee replacement      09/19/23 1648            Diagnostic Studies: DG Knee Right Port  Result Date: 09/19/2023 CLINICAL DATA:  Status post right knee replacement. EXAM: PORTABLE RIGHT KNEE - 1-2 VIEW COMPARISON:  06/22/2023 FINDINGS: Right knee arthroplasty in expected alignment. No periprosthetic lucency or fracture. There has been patellar resurfacing. Recent postsurgical change includes air and edema in the soft tissues and joint space. Chronic ghost tracks in the distal femur. Anterior skin staples in place. IMPRESSION: Right knee arthroplasty without immediate postoperative complication. Electronically Signed   By: Narda Rutherford M.D.   On: 09/19/2023 16:43   MR Knee Right w/o contrast  Result Date: 09/12/2023 CLINICAL DATA:  Right knee pain. History of distal femur fracture with ORIF and subsequent hardware removal EXAM: MRI OF THE RIGHT KNEE WITHOUT CONTRAST TECHNIQUE: Multiplanar, multisequence MR imaging of the knee was performed. No intravenous contrast was administered. COMPARISON:  X-ray 06/22/2023, CT 08/28/2022 FINDINGS: MENISCI Medial meniscus:  Intact. Lateral meniscus:  Intact. LIGAMENTS Cruciates: Intact ACL and PCL. Collaterals: Chronic partial tear of the MCL at its femoral attachment site. No periligamentous edema. Lateral collateral ligament complex intact. CARTILAGE Patellofemoral: Cartilage thinning with full-thickness fissuring along the lateral  patellar facet. Full-thickness cartilage loss within the inferior aspect of the medial trochlea. Medial: Mild partial-thickness chondral surface irregularity of the weight-bearing medial compartment. Lateral:  No chondral defect. MISCELLANEOUS Joint:  Small joint effusion.  Edema within Hoffa's fat. Popliteal Fossa:  Tiny Baker's cyst. Intact popliteus tendon. Extensor Mechanism:  Intact quadriceps and patellar tendons. Bones: Chronic nondisplaced fracture of the medial femoral condyle with intra-articular extension at the trochlear groove. Mild marrow edema persists along the fracture plane (series 105, image 14). Previous distal femoral ORIF with subsequent hardware removal. No new fractures. No malalignment. Hypoplastic medial trochlea. Other: No significant periarticular soft tissue findings. IMPRESSION: 1. Chronic nondisplaced fracture of the medial femoral condyle with intra-articular extension at the trochlear groove. Mild marrow edema persists along the fracture plane. 2. Previous distal femoral ORIF with subsequent hardware removal. 3. Chronic partial tear of the MCL at its femoral attachment site. 4. Mild patellofemoral and medial compartment osteoarthritis. 5. Small joint effusion. Tiny Baker's cyst. Electronically Signed   By: Duanne Guess D.O.   On: 09/12/2023 18:44   XR Lumbar Spine Complete  Result Date: 08/30/2023 X-rays of the lumbar spine taken 08/30/2023 were independently reviewed and interpreted, showing posterior instrumentation at L5 and S1. The right L5 screw appears lateral. The remaining screws appear in appropriate position. ALIF cage at L5/S1 is in appropriate position. There is no lucency around the cage. No cage subsidence seen.     Disposition: Discharge disposition: 01-Home or Self Care          Follow-up Information     Kathryne Hitch, MD Follow up in 2 week(s).   Specialty: Orthopedic Surgery Contact information: [address] [place]  [address] [phone]         Karie Schwalbe, MD Follow up.   Specialties: Internal Medicine, Pediatrics Contact information: [address] [place] [address] [phone]         [facility] Follow up.   Specialty: Home Health Services Why: home healthservices will be provided by [facility] , start of  care within 48 hours Contact information: [address] [place] [address] [phone]                  Signed: Kathryne Hitch 09/21/2023, 3:52 PM

## 2023-09-21 NOTE — Progress Notes (Signed)
Physical Therapy Treatment Patient Details Name: Harold Smith MRN: [id number] DOB: 21-Sep-1966 Today's Date: 09/21/2023   History of Present Illness 57yo M who received R TKR on 09/19/23. PMH chronic pain syndrome, anxiety, metabolic bone disease, hx R femur fx, mood disorder, lumbar disc degeneration, sleep disturbance, HTN, IBS, achilles tendon repair, lumbar fusion, hernia repair, R knee arthroscopy, R femur ORIF, rotator cuff repair    PT Comments  Pt received in bed, very fatigued as he has not been sleeping well in the hospital, premedicated but pain levels still fairly high. Able to progress gait today but still a bit limited by pain. Supine extension AROM approximately -15*, supine flexion AAROM approximately 60-70*. LE a bit more swollen today which would certainly explain some loss of flexion ROM as compared to eval yesterday. Left in bed with all needs met. Will plan on return in the afternoon with ongoing focus on gait and appropriate TKR exercises, will not need to navigate steps to enter home and can stay on the main level.     If plan is discharge home, recommend the following: A little help with walking and/or transfers;Assistance with cooking/housework;A little help with bathing/dressing/bathroom;Help with stairs or ramp for entrance   Can travel by private vehicle        Equipment Recommendations  Other (comment) (shower seat)    Recommendations for Other Services       Precautions / Restrictions Precautions Precautions: Fall;Other (comment) Precaution Comments: watch BP Restrictions Weight Bearing Restrictions: Yes RLE Weight Bearing: Weight bearing as tolerated     Mobility  Bed Mobility Overal bed mobility: Modified Independent             General bed mobility comments: HOB elevated, good use of LLE to assist R LE in/out of bed    Transfers Overall transfer level: Needs assistance Equipment used: Rolling walker (2 wheels) Transfers: Sit to/from  Stand Sit to Stand: Supervision           General transfer comment: good sequencing noted, no physical assist given    Ambulation/Gait Ambulation/Gait assistance: Contact guard assist Gait Distance (Feet): 140 Feet Assistive device: Rolling walker (2 wheels) Gait Pattern/deviations: Step-through pattern, Decreased step length - left, Decreased stance time - right, Decreased weight shift to right, Antalgic Gait velocity: decreased     General Gait Details: cues for pushing RW instead of picking it up with every step, attemped heel-toe pattern but difficult this morning due to pain and knee stiffness   Stairs             Wheelchair Mobility     Tilt Bed    Modified Rankin (Stroke Patients Only)       Balance Overall balance assessment: No apparent balance deficits (not formally assessed)                                          Cognition Arousal: Alert Behavior During Therapy: WFL for tasks assessed/performed Overall Cognitive Status: Within Functional Limits for tasks assessed                                          Exercises Total Joint Exercises Goniometric ROM: extension AROM supine approximately -15*, flexion AAROM supine approximately 60-70*    General Comments  Pertinent Vitals/Pain Pain Assessment Pain Assessment: 0-10 Pain Score: 9  Pain Location: R knee Pain Descriptors / Indicators: Discomfort, Throbbing, Aching Pain Intervention(s): Limited activity within patient's tolerance, Monitored during session, Premedicated before session    Home Living                          Prior Function            PT Goals (current goals can now be found in the care plan section) Acute Rehab PT Goals Patient Stated Goal: less pain, go home when able PT Goal Formulation: With patient Time For Goal Achievement: 10/04/23 Potential to Achieve Goals: Good Progress towards PT goals: Progressing toward  goals    Frequency    7X/week      PT Plan      Co-evaluation              AM-PAC PT "6 Clicks" Mobility   Outcome Measure  Help needed turning from your back to your side while in a flat bed without using bedrails?: None Help needed moving from lying on your back to sitting on the side of a flat bed without using bedrails?: None Help needed moving to and from a bed to a chair (including a wheelchair)?: A Little Help needed standing up from a chair using your arms (e.g., wheelchair or bedside chair)?: A Little Help needed to walk in hospital room?: A Little Help needed climbing 3-5 steps with a railing? : A Little 6 Click Score: 20    End of Session Equipment Utilized During Treatment: Gait belt Activity Tolerance: Patient tolerated treatment well;Patient limited by pain;Patient limited by fatigue Patient left: in bed;with call bell/phone within reach Nurse Communication: Mobility status PT Visit Diagnosis: Muscle weakness (generalized) (M62.81);Difficulty in walking, not elsewhere classified (R26.2);Pain Pain - Right/Left: Right Pain - part of body: Knee     Time: 0910-0929 PT Time Calculation (min) (ACUTE ONLY): 19 min  Charges:    $Gait Training: 8-22 mins PT General Charges $$ ACUTE PT VISIT: 1 Visit                    Nedra Hai, PT, DPT 09/21/23 9:42 AM

## 2023-09-21 NOTE — Progress Notes (Signed)
    Durable Medical Equipment  (From admission, onward)           Start     Ordered   09/19/23 1649  DME 3 n 1  Once        09/19/23 1648   09/19/23 1649  DME Walker rolling  Once       Question Answer Comment  Walker: With 5 Inch Wheels   Patient needs a walker to treat with the following condition Status post total right knee replacement      09/19/23 1648

## 2023-09-22 ENCOUNTER — Other Ambulatory Visit: Payer: Self-pay | Admitting: Orthopedic Surgery

## 2023-09-22 DIAGNOSIS — G894 Chronic pain syndrome: Secondary | ICD-10-CM | POA: Diagnosis not present

## 2023-09-22 DIAGNOSIS — Z7982 Long term (current) use of aspirin: Secondary | ICD-10-CM | POA: Diagnosis not present

## 2023-09-22 DIAGNOSIS — K219 Gastro-esophageal reflux disease without esophagitis: Secondary | ICD-10-CM | POA: Diagnosis not present

## 2023-09-22 DIAGNOSIS — Z5982 Transportation insecurity: Secondary | ICD-10-CM | POA: Diagnosis not present

## 2023-09-22 DIAGNOSIS — Z86010 Personal history of colonic polyps: Secondary | ICD-10-CM | POA: Diagnosis not present

## 2023-09-22 DIAGNOSIS — Z96651 Presence of right artificial knee joint: Secondary | ICD-10-CM | POA: Diagnosis not present

## 2023-09-22 DIAGNOSIS — Z981 Arthrodesis status: Secondary | ICD-10-CM

## 2023-09-22 DIAGNOSIS — M4306 Spondylolysis, lumbar region: Secondary | ICD-10-CM | POA: Diagnosis not present

## 2023-09-22 DIAGNOSIS — Z471 Aftercare following joint replacement surgery: Secondary | ICD-10-CM | POA: Diagnosis not present

## 2023-09-22 DIAGNOSIS — M5136 Other intervertebral disc degeneration, lumbar region: Secondary | ICD-10-CM | POA: Diagnosis not present

## 2023-09-22 DIAGNOSIS — F32A Depression, unspecified: Secondary | ICD-10-CM | POA: Diagnosis not present

## 2023-09-22 DIAGNOSIS — I10 Essential (primary) hypertension: Secondary | ICD-10-CM | POA: Diagnosis not present

## 2023-09-22 DIAGNOSIS — F419 Anxiety disorder, unspecified: Secondary | ICD-10-CM | POA: Diagnosis not present

## 2023-09-22 DIAGNOSIS — Z79891 Long term (current) use of opiate analgesic: Secondary | ICD-10-CM | POA: Diagnosis not present

## 2023-09-22 DIAGNOSIS — F17210 Nicotine dependence, cigarettes, uncomplicated: Secondary | ICD-10-CM | POA: Diagnosis not present

## 2023-09-22 DIAGNOSIS — D509 Iron deficiency anemia, unspecified: Secondary | ICD-10-CM | POA: Diagnosis not present

## 2023-09-22 DIAGNOSIS — Z9181 History of falling: Secondary | ICD-10-CM | POA: Diagnosis not present

## 2023-09-22 DIAGNOSIS — E669 Obesity, unspecified: Secondary | ICD-10-CM | POA: Diagnosis not present

## 2023-09-22 DIAGNOSIS — K5730 Diverticulosis of large intestine without perforation or abscess without bleeding: Secondary | ICD-10-CM | POA: Diagnosis not present

## 2023-09-22 DIAGNOSIS — F39 Unspecified mood [affective] disorder: Secondary | ICD-10-CM | POA: Diagnosis not present

## 2023-09-22 DIAGNOSIS — E559 Vitamin D deficiency, unspecified: Secondary | ICD-10-CM | POA: Diagnosis not present

## 2023-09-22 DIAGNOSIS — K589 Irritable bowel syndrome without diarrhea: Secondary | ICD-10-CM | POA: Diagnosis not present

## 2023-09-22 DIAGNOSIS — N529 Male erectile dysfunction, unspecified: Secondary | ICD-10-CM | POA: Diagnosis not present

## 2023-09-22 NOTE — Telephone Encounter (Signed)
Referral faxed to [facility] via fax

## 2023-09-25 DIAGNOSIS — Z79891 Long term (current) use of opiate analgesic: Secondary | ICD-10-CM | POA: Diagnosis not present

## 2023-09-25 DIAGNOSIS — K5730 Diverticulosis of large intestine without perforation or abscess without bleeding: Secondary | ICD-10-CM | POA: Diagnosis not present

## 2023-09-25 DIAGNOSIS — E669 Obesity, unspecified: Secondary | ICD-10-CM | POA: Diagnosis not present

## 2023-09-25 DIAGNOSIS — F39 Unspecified mood [affective] disorder: Secondary | ICD-10-CM | POA: Diagnosis not present

## 2023-09-25 DIAGNOSIS — G894 Chronic pain syndrome: Secondary | ICD-10-CM | POA: Diagnosis not present

## 2023-09-25 DIAGNOSIS — E559 Vitamin D deficiency, unspecified: Secondary | ICD-10-CM | POA: Diagnosis not present

## 2023-09-25 DIAGNOSIS — K219 Gastro-esophageal reflux disease without esophagitis: Secondary | ICD-10-CM | POA: Diagnosis not present

## 2023-09-25 DIAGNOSIS — M4306 Spondylolysis, lumbar region: Secondary | ICD-10-CM | POA: Diagnosis not present

## 2023-09-25 DIAGNOSIS — F419 Anxiety disorder, unspecified: Secondary | ICD-10-CM | POA: Diagnosis not present

## 2023-09-25 DIAGNOSIS — I10 Essential (primary) hypertension: Secondary | ICD-10-CM | POA: Diagnosis not present

## 2023-09-25 DIAGNOSIS — D509 Iron deficiency anemia, unspecified: Secondary | ICD-10-CM | POA: Diagnosis not present

## 2023-09-25 DIAGNOSIS — Z5982 Transportation insecurity: Secondary | ICD-10-CM | POA: Diagnosis not present

## 2023-09-25 DIAGNOSIS — Z96651 Presence of right artificial knee joint: Secondary | ICD-10-CM | POA: Diagnosis not present

## 2023-09-25 DIAGNOSIS — Z9181 History of falling: Secondary | ICD-10-CM | POA: Diagnosis not present

## 2023-09-25 DIAGNOSIS — F32A Depression, unspecified: Secondary | ICD-10-CM | POA: Diagnosis not present

## 2023-09-25 DIAGNOSIS — N529 Male erectile dysfunction, unspecified: Secondary | ICD-10-CM | POA: Diagnosis not present

## 2023-09-25 DIAGNOSIS — Z471 Aftercare following joint replacement surgery: Secondary | ICD-10-CM | POA: Diagnosis not present

## 2023-09-25 DIAGNOSIS — Z86010 Personal history of colonic polyps: Secondary | ICD-10-CM | POA: Diagnosis not present

## 2023-09-25 DIAGNOSIS — K589 Irritable bowel syndrome without diarrhea: Secondary | ICD-10-CM | POA: Diagnosis not present

## 2023-09-25 DIAGNOSIS — F17210 Nicotine dependence, cigarettes, uncomplicated: Secondary | ICD-10-CM | POA: Diagnosis not present

## 2023-09-25 DIAGNOSIS — Z7982 Long term (current) use of aspirin: Secondary | ICD-10-CM | POA: Diagnosis not present

## 2023-09-25 DIAGNOSIS — M5136 Other intervertebral disc degeneration, lumbar region: Secondary | ICD-10-CM | POA: Diagnosis not present

## 2023-09-26 ENCOUNTER — Other Ambulatory Visit: Payer: 59

## 2023-09-26 ENCOUNTER — Encounter: Payer: Self-pay | Admitting: Oncology

## 2023-09-26 ENCOUNTER — Ambulatory Visit
Admission: RE | Admit: 2023-09-26 | Discharge: 2023-09-26 | Disposition: A | Discharge status: Home / Self Care | Payer: 59 | Source: Ambulatory Visit | Attending: Orthopedic Surgery | Admitting: Orthopedic Surgery

## 2023-09-26 DIAGNOSIS — M5146 Schmorl's nodes, lumbar region: Secondary | ICD-10-CM | POA: Diagnosis not present

## 2023-09-26 DIAGNOSIS — M4316 Spondylolisthesis, lumbar region: Secondary | ICD-10-CM | POA: Diagnosis not present

## 2023-09-26 DIAGNOSIS — Z981 Arthrodesis status: Secondary | ICD-10-CM | POA: Diagnosis not present

## 2023-09-26 DIAGNOSIS — Z419 Encounter for procedure for purposes other than remedying health state, unspecified: Secondary | ICD-10-CM | POA: Diagnosis not present

## 2023-09-27 DIAGNOSIS — E669 Obesity, unspecified: Secondary | ICD-10-CM | POA: Diagnosis not present

## 2023-09-27 DIAGNOSIS — D509 Iron deficiency anemia, unspecified: Secondary | ICD-10-CM | POA: Diagnosis not present

## 2023-09-27 DIAGNOSIS — N529 Male erectile dysfunction, unspecified: Secondary | ICD-10-CM | POA: Diagnosis not present

## 2023-09-27 DIAGNOSIS — M4306 Spondylolysis, lumbar region: Secondary | ICD-10-CM | POA: Diagnosis not present

## 2023-09-27 DIAGNOSIS — G894 Chronic pain syndrome: Secondary | ICD-10-CM | POA: Diagnosis not present

## 2023-09-27 DIAGNOSIS — K219 Gastro-esophageal reflux disease without esophagitis: Secondary | ICD-10-CM | POA: Diagnosis not present

## 2023-09-27 DIAGNOSIS — I10 Essential (primary) hypertension: Secondary | ICD-10-CM | POA: Diagnosis not present

## 2023-09-27 DIAGNOSIS — M51369 Other intervertebral disc degeneration, lumbar region without mention of lumbar back pain or lower extremity pain: Secondary | ICD-10-CM | POA: Diagnosis not present

## 2023-09-27 DIAGNOSIS — Z9181 History of falling: Secondary | ICD-10-CM | POA: Diagnosis not present

## 2023-09-27 DIAGNOSIS — F39 Unspecified mood [affective] disorder: Secondary | ICD-10-CM | POA: Diagnosis not present

## 2023-09-27 DIAGNOSIS — F32A Depression, unspecified: Secondary | ICD-10-CM | POA: Diagnosis not present

## 2023-09-27 DIAGNOSIS — K589 Irritable bowel syndrome without diarrhea: Secondary | ICD-10-CM | POA: Diagnosis not present

## 2023-09-27 DIAGNOSIS — Z471 Aftercare following joint replacement surgery: Secondary | ICD-10-CM | POA: Diagnosis not present

## 2023-09-27 DIAGNOSIS — Z860100 Personal history of colon polyps, unspecified: Secondary | ICD-10-CM | POA: Diagnosis not present

## 2023-09-27 DIAGNOSIS — Z5982 Transportation insecurity: Secondary | ICD-10-CM | POA: Diagnosis not present

## 2023-09-27 DIAGNOSIS — F17210 Nicotine dependence, cigarettes, uncomplicated: Secondary | ICD-10-CM | POA: Diagnosis not present

## 2023-09-27 DIAGNOSIS — Z79891 Long term (current) use of opiate analgesic: Secondary | ICD-10-CM | POA: Diagnosis not present

## 2023-09-27 DIAGNOSIS — Z96651 Presence of right artificial knee joint: Secondary | ICD-10-CM | POA: Diagnosis not present

## 2023-09-27 DIAGNOSIS — E559 Vitamin D deficiency, unspecified: Secondary | ICD-10-CM | POA: Diagnosis not present

## 2023-09-27 DIAGNOSIS — F419 Anxiety disorder, unspecified: Secondary | ICD-10-CM | POA: Diagnosis not present

## 2023-09-27 DIAGNOSIS — Z7982 Long term (current) use of aspirin: Secondary | ICD-10-CM | POA: Diagnosis not present

## 2023-09-27 DIAGNOSIS — K5730 Diverticulosis of large intestine without perforation or abscess without bleeding: Secondary | ICD-10-CM | POA: Diagnosis not present

## 2023-09-29 ENCOUNTER — Other Ambulatory Visit: Payer: Self-pay

## 2023-09-29 ENCOUNTER — Telehealth: Payer: Self-pay

## 2023-09-29 ENCOUNTER — Other Ambulatory Visit: Payer: Self-pay | Admitting: Orthopaedic Surgery

## 2023-09-29 DIAGNOSIS — F32A Depression, unspecified: Secondary | ICD-10-CM | POA: Diagnosis not present

## 2023-09-29 DIAGNOSIS — D509 Iron deficiency anemia, unspecified: Secondary | ICD-10-CM | POA: Diagnosis not present

## 2023-09-29 DIAGNOSIS — Z5982 Transportation insecurity: Secondary | ICD-10-CM | POA: Diagnosis not present

## 2023-09-29 DIAGNOSIS — F39 Unspecified mood [affective] disorder: Secondary | ICD-10-CM | POA: Diagnosis not present

## 2023-09-29 DIAGNOSIS — Z96651 Presence of right artificial knee joint: Secondary | ICD-10-CM | POA: Diagnosis not present

## 2023-09-29 DIAGNOSIS — F419 Anxiety disorder, unspecified: Secondary | ICD-10-CM | POA: Diagnosis not present

## 2023-09-29 DIAGNOSIS — K219 Gastro-esophageal reflux disease without esophagitis: Secondary | ICD-10-CM | POA: Diagnosis not present

## 2023-09-29 DIAGNOSIS — K5730 Diverticulosis of large intestine without perforation or abscess without bleeding: Secondary | ICD-10-CM | POA: Diagnosis not present

## 2023-09-29 DIAGNOSIS — Z471 Aftercare following joint replacement surgery: Secondary | ICD-10-CM | POA: Diagnosis not present

## 2023-09-29 DIAGNOSIS — Z9181 History of falling: Secondary | ICD-10-CM | POA: Diagnosis not present

## 2023-09-29 DIAGNOSIS — K589 Irritable bowel syndrome without diarrhea: Secondary | ICD-10-CM | POA: Diagnosis not present

## 2023-09-29 DIAGNOSIS — F17210 Nicotine dependence, cigarettes, uncomplicated: Secondary | ICD-10-CM | POA: Diagnosis not present

## 2023-09-29 DIAGNOSIS — Z79891 Long term (current) use of opiate analgesic: Secondary | ICD-10-CM | POA: Diagnosis not present

## 2023-09-29 DIAGNOSIS — Z860100 Personal history of colon polyps, unspecified: Secondary | ICD-10-CM | POA: Diagnosis not present

## 2023-09-29 DIAGNOSIS — E559 Vitamin D deficiency, unspecified: Secondary | ICD-10-CM | POA: Diagnosis not present

## 2023-09-29 DIAGNOSIS — N529 Male erectile dysfunction, unspecified: Secondary | ICD-10-CM | POA: Diagnosis not present

## 2023-09-29 DIAGNOSIS — Z7982 Long term (current) use of aspirin: Secondary | ICD-10-CM | POA: Diagnosis not present

## 2023-09-29 DIAGNOSIS — G894 Chronic pain syndrome: Secondary | ICD-10-CM | POA: Diagnosis not present

## 2023-09-29 DIAGNOSIS — M51369 Other intervertebral disc degeneration, lumbar region without mention of lumbar back pain or lower extremity pain: Secondary | ICD-10-CM | POA: Diagnosis not present

## 2023-09-29 DIAGNOSIS — E669 Obesity, unspecified: Secondary | ICD-10-CM | POA: Diagnosis not present

## 2023-09-29 DIAGNOSIS — M4306 Spondylolysis, lumbar region: Secondary | ICD-10-CM | POA: Diagnosis not present

## 2023-09-29 DIAGNOSIS — I10 Essential (primary) hypertension: Secondary | ICD-10-CM | POA: Diagnosis not present

## 2023-09-29 MED ORDER — HYDROMORPHONE HCL 4 MG PO TABS: 4.0000 mg | ORAL_TABLET | ORAL | 0 refills | Status: DC | PRN

## 2023-09-29 MED ORDER — DOXYCYCLINE HYCLATE 100 MG PO TABS: 100.0000 mg | ORAL_TABLET | Freq: Two times a day (BID) | ORAL | 0 refills | Status: DC

## 2023-09-29 NOTE — Telephone Encounter (Signed)
Patient aware of Rx being sent in and that he has doppler today at 4

## 2023-09-29 NOTE — Telephone Encounter (Signed)
Patient called stating that per his PT, he has a temperature of 99.1, swelling and pain in the right knee down to his right leg, and warmness.  Patient had right knee surgery on 09/19/23.  Would like a Rx refill on pain medicine and antibiotic just in case of infection per patient?  Cb# [phone].  Please advise.  Thank you.

## 2023-10-01 ENCOUNTER — Other Ambulatory Visit: Payer: Self-pay | Admitting: Orthopedic Surgery

## 2023-10-02 ENCOUNTER — Encounter: Payer: Self-pay | Admitting: Orthopaedic Surgery

## 2023-10-02 ENCOUNTER — Ambulatory Visit (INDEPENDENT_AMBULATORY_CARE_PROVIDER_SITE_OTHER): Payer: 59 | Admitting: Orthopaedic Surgery

## 2023-10-02 ENCOUNTER — Ambulatory Visit (HOSPITAL_COMMUNITY)
Admission: RE | Admit: 2023-10-02 | Discharge: 2023-10-02 | Disposition: A | Discharge status: Home / Self Care | Payer: 59 | Source: Ambulatory Visit | Attending: Surgery | Admitting: Surgery

## 2023-10-02 DIAGNOSIS — Z9181 History of falling: Secondary | ICD-10-CM | POA: Diagnosis not present

## 2023-10-02 DIAGNOSIS — M51369 Other intervertebral disc degeneration, lumbar region without mention of lumbar back pain or lower extremity pain: Secondary | ICD-10-CM | POA: Diagnosis not present

## 2023-10-02 DIAGNOSIS — K219 Gastro-esophageal reflux disease without esophagitis: Secondary | ICD-10-CM | POA: Diagnosis not present

## 2023-10-02 DIAGNOSIS — G894 Chronic pain syndrome: Secondary | ICD-10-CM | POA: Diagnosis not present

## 2023-10-02 DIAGNOSIS — K589 Irritable bowel syndrome without diarrhea: Secondary | ICD-10-CM | POA: Diagnosis not present

## 2023-10-02 DIAGNOSIS — F32A Depression, unspecified: Secondary | ICD-10-CM | POA: Diagnosis not present

## 2023-10-02 DIAGNOSIS — Z7982 Long term (current) use of aspirin: Secondary | ICD-10-CM | POA: Diagnosis not present

## 2023-10-02 DIAGNOSIS — Z96651 Presence of right artificial knee joint: Secondary | ICD-10-CM | POA: Insufficient documentation

## 2023-10-02 DIAGNOSIS — M4306 Spondylolysis, lumbar region: Secondary | ICD-10-CM | POA: Diagnosis not present

## 2023-10-02 DIAGNOSIS — Z471 Aftercare following joint replacement surgery: Secondary | ICD-10-CM | POA: Diagnosis not present

## 2023-10-02 DIAGNOSIS — I10 Essential (primary) hypertension: Secondary | ICD-10-CM | POA: Diagnosis not present

## 2023-10-02 DIAGNOSIS — Z5982 Transportation insecurity: Secondary | ICD-10-CM | POA: Diagnosis not present

## 2023-10-02 DIAGNOSIS — F17210 Nicotine dependence, cigarettes, uncomplicated: Secondary | ICD-10-CM | POA: Diagnosis not present

## 2023-10-02 DIAGNOSIS — F39 Unspecified mood [affective] disorder: Secondary | ICD-10-CM | POA: Diagnosis not present

## 2023-10-02 DIAGNOSIS — Z79891 Long term (current) use of opiate analgesic: Secondary | ICD-10-CM | POA: Diagnosis not present

## 2023-10-02 DIAGNOSIS — E559 Vitamin D deficiency, unspecified: Secondary | ICD-10-CM | POA: Diagnosis not present

## 2023-10-02 DIAGNOSIS — E669 Obesity, unspecified: Secondary | ICD-10-CM | POA: Diagnosis not present

## 2023-10-02 DIAGNOSIS — N529 Male erectile dysfunction, unspecified: Secondary | ICD-10-CM | POA: Diagnosis not present

## 2023-10-02 DIAGNOSIS — K5730 Diverticulosis of large intestine without perforation or abscess without bleeding: Secondary | ICD-10-CM | POA: Diagnosis not present

## 2023-10-02 DIAGNOSIS — D509 Iron deficiency anemia, unspecified: Secondary | ICD-10-CM | POA: Diagnosis not present

## 2023-10-02 DIAGNOSIS — F419 Anxiety disorder, unspecified: Secondary | ICD-10-CM | POA: Diagnosis not present

## 2023-10-02 DIAGNOSIS — Z860100 Personal history of colon polyps, unspecified: Secondary | ICD-10-CM | POA: Diagnosis not present

## 2023-10-02 MED ORDER — CYCLOBENZAPRINE HCL 5 MG PO TABS: 5.0000 mg | ORAL_TABLET | Freq: Three times a day (TID) | ORAL | 2 refills | Status: DC | PRN

## 2023-10-02 NOTE — Progress Notes (Signed)
The patient is here for his first postoperative visit status post a right total knee replacement 2 weeks ago tomorrow.  He has had a lot of swelling and did have some calf pain.  A Doppler ultrasound was negative for DVT.  His calf is soft today although the leg is swollen.  He lacks full extension by 5 degrees and I can flex him to about 80 degrees.  His right knee incision looks good.  The staples can removed and Steri-Strips applied.  He is on Dilaudid because his primary care physician and has had him on Percocet for a very long period of time.  We will have to continue this medication for a little longer.  I will send in some Flexeril for him.  It is essential now we will get him into outpatient physical therapy in [place] which can be done as part of [facility] and [facility].  We will then see him back in a month for repeat exam but no x-rays are needed.

## 2023-10-02 NOTE — Addendum Note (Signed)
Addended by: Wendi Maya on: 10/02/2023 09:47 AM   Modules accepted: Orders

## 2023-10-04 DIAGNOSIS — Z860100 Personal history of colon polyps, unspecified: Secondary | ICD-10-CM | POA: Diagnosis not present

## 2023-10-04 DIAGNOSIS — K5730 Diverticulosis of large intestine without perforation or abscess without bleeding: Secondary | ICD-10-CM | POA: Diagnosis not present

## 2023-10-04 DIAGNOSIS — F32A Depression, unspecified: Secondary | ICD-10-CM | POA: Diagnosis not present

## 2023-10-04 DIAGNOSIS — K219 Gastro-esophageal reflux disease without esophagitis: Secondary | ICD-10-CM | POA: Diagnosis not present

## 2023-10-04 DIAGNOSIS — D509 Iron deficiency anemia, unspecified: Secondary | ICD-10-CM | POA: Diagnosis not present

## 2023-10-04 DIAGNOSIS — I10 Essential (primary) hypertension: Secondary | ICD-10-CM | POA: Diagnosis not present

## 2023-10-04 DIAGNOSIS — Z79891 Long term (current) use of opiate analgesic: Secondary | ICD-10-CM | POA: Diagnosis not present

## 2023-10-04 DIAGNOSIS — F17210 Nicotine dependence, cigarettes, uncomplicated: Secondary | ICD-10-CM | POA: Diagnosis not present

## 2023-10-04 DIAGNOSIS — Z5982 Transportation insecurity: Secondary | ICD-10-CM | POA: Diagnosis not present

## 2023-10-04 DIAGNOSIS — Z7982 Long term (current) use of aspirin: Secondary | ICD-10-CM | POA: Diagnosis not present

## 2023-10-04 DIAGNOSIS — Z96651 Presence of right artificial knee joint: Secondary | ICD-10-CM | POA: Diagnosis not present

## 2023-10-04 DIAGNOSIS — Z9181 History of falling: Secondary | ICD-10-CM | POA: Diagnosis not present

## 2023-10-04 DIAGNOSIS — G894 Chronic pain syndrome: Secondary | ICD-10-CM | POA: Diagnosis not present

## 2023-10-04 DIAGNOSIS — N529 Male erectile dysfunction, unspecified: Secondary | ICD-10-CM | POA: Diagnosis not present

## 2023-10-04 DIAGNOSIS — E559 Vitamin D deficiency, unspecified: Secondary | ICD-10-CM | POA: Diagnosis not present

## 2023-10-04 DIAGNOSIS — F419 Anxiety disorder, unspecified: Secondary | ICD-10-CM | POA: Diagnosis not present

## 2023-10-04 DIAGNOSIS — F39 Unspecified mood [affective] disorder: Secondary | ICD-10-CM | POA: Diagnosis not present

## 2023-10-04 DIAGNOSIS — M51369 Other intervertebral disc degeneration, lumbar region without mention of lumbar back pain or lower extremity pain: Secondary | ICD-10-CM | POA: Diagnosis not present

## 2023-10-04 DIAGNOSIS — K589 Irritable bowel syndrome without diarrhea: Secondary | ICD-10-CM | POA: Diagnosis not present

## 2023-10-04 DIAGNOSIS — M4306 Spondylolysis, lumbar region: Secondary | ICD-10-CM | POA: Diagnosis not present

## 2023-10-04 DIAGNOSIS — E669 Obesity, unspecified: Secondary | ICD-10-CM | POA: Diagnosis not present

## 2023-10-04 DIAGNOSIS — Z471 Aftercare following joint replacement surgery: Secondary | ICD-10-CM | POA: Diagnosis not present

## 2023-10-06 ENCOUNTER — Other Ambulatory Visit: Payer: Self-pay | Admitting: Orthopaedic Surgery

## 2023-10-06 ENCOUNTER — Encounter: Payer: Self-pay | Admitting: Orthopedic Surgery

## 2023-10-06 ENCOUNTER — Encounter: Payer: Self-pay | Admitting: Internal Medicine

## 2023-10-06 ENCOUNTER — Encounter: Payer: Self-pay | Admitting: Orthopaedic Surgery

## 2023-10-06 ENCOUNTER — Other Ambulatory Visit: Payer: Self-pay | Admitting: Orthopedic Surgery

## 2023-10-06 DIAGNOSIS — Z860100 Personal history of colon polyps, unspecified: Secondary | ICD-10-CM | POA: Diagnosis not present

## 2023-10-06 DIAGNOSIS — Z471 Aftercare following joint replacement surgery: Secondary | ICD-10-CM | POA: Diagnosis not present

## 2023-10-06 DIAGNOSIS — F17210 Nicotine dependence, cigarettes, uncomplicated: Secondary | ICD-10-CM | POA: Diagnosis not present

## 2023-10-06 DIAGNOSIS — K219 Gastro-esophageal reflux disease without esophagitis: Secondary | ICD-10-CM | POA: Diagnosis not present

## 2023-10-06 DIAGNOSIS — Z79891 Long term (current) use of opiate analgesic: Secondary | ICD-10-CM | POA: Diagnosis not present

## 2023-10-06 DIAGNOSIS — D509 Iron deficiency anemia, unspecified: Secondary | ICD-10-CM | POA: Diagnosis not present

## 2023-10-06 DIAGNOSIS — F419 Anxiety disorder, unspecified: Secondary | ICD-10-CM | POA: Diagnosis not present

## 2023-10-06 DIAGNOSIS — M4306 Spondylolysis, lumbar region: Secondary | ICD-10-CM | POA: Diagnosis not present

## 2023-10-06 DIAGNOSIS — K5730 Diverticulosis of large intestine without perforation or abscess without bleeding: Secondary | ICD-10-CM | POA: Diagnosis not present

## 2023-10-06 DIAGNOSIS — Z7982 Long term (current) use of aspirin: Secondary | ICD-10-CM | POA: Diagnosis not present

## 2023-10-06 DIAGNOSIS — Z9181 History of falling: Secondary | ICD-10-CM | POA: Diagnosis not present

## 2023-10-06 DIAGNOSIS — Z5982 Transportation insecurity: Secondary | ICD-10-CM | POA: Diagnosis not present

## 2023-10-06 DIAGNOSIS — K589 Irritable bowel syndrome without diarrhea: Secondary | ICD-10-CM | POA: Diagnosis not present

## 2023-10-06 DIAGNOSIS — G894 Chronic pain syndrome: Secondary | ICD-10-CM | POA: Diagnosis not present

## 2023-10-06 DIAGNOSIS — M51369 Other intervertebral disc degeneration, lumbar region without mention of lumbar back pain or lower extremity pain: Secondary | ICD-10-CM | POA: Diagnosis not present

## 2023-10-06 DIAGNOSIS — E559 Vitamin D deficiency, unspecified: Secondary | ICD-10-CM | POA: Diagnosis not present

## 2023-10-06 DIAGNOSIS — I10 Essential (primary) hypertension: Secondary | ICD-10-CM | POA: Diagnosis not present

## 2023-10-06 DIAGNOSIS — F39 Unspecified mood [affective] disorder: Secondary | ICD-10-CM | POA: Diagnosis not present

## 2023-10-06 DIAGNOSIS — Z96651 Presence of right artificial knee joint: Secondary | ICD-10-CM | POA: Diagnosis not present

## 2023-10-06 DIAGNOSIS — E669 Obesity, unspecified: Secondary | ICD-10-CM | POA: Diagnosis not present

## 2023-10-06 DIAGNOSIS — F32A Depression, unspecified: Secondary | ICD-10-CM | POA: Diagnosis not present

## 2023-10-06 DIAGNOSIS — N529 Male erectile dysfunction, unspecified: Secondary | ICD-10-CM | POA: Diagnosis not present

## 2023-10-06 MED ORDER — BACLOFEN 10 MG PO TABS: 10.0000 mg | ORAL_TABLET | Freq: Three times a day (TID) | ORAL | 0 refills | Status: DC

## 2023-10-07 MED ORDER — OXYCODONE-ACETAMINOPHEN 5-325 MG PO TABS: 1.0000 | ORAL_TABLET | Freq: Two times a day (BID) | ORAL | 0 refills | Status: DC | PRN

## 2023-10-07 NOTE — Addendum Note (Signed)
Addended by: Tillman Abide I on: 10/07/2023 08:32 PM   Modules accepted: Orders

## 2023-10-08 ENCOUNTER — Other Ambulatory Visit: Payer: Self-pay | Admitting: Internal Medicine

## 2023-10-09 ENCOUNTER — Other Ambulatory Visit: Payer: Self-pay | Admitting: Orthopaedic Surgery

## 2023-10-09 MED ORDER — OXYCODONE-ACETAMINOPHEN 5-325 MG PO TABS: 1.0000 | ORAL_TABLET | Freq: Four times a day (QID) | ORAL | 0 refills | Status: DC | PRN

## 2023-10-09 NOTE — Telephone Encounter (Signed)
Last Refill 09-11-23 #60 W/ 0 REFILLS  Last OV 07/12/23 Next OV 10/16/23

## 2023-10-09 NOTE — Telephone Encounter (Signed)
Per chart review Dr. Magnus Ivan sent in refill of percocet to pharmacy for patient. Advised moving forward patient can no longer get pain medication from surgeon.

## 2023-10-10 ENCOUNTER — Encounter: Payer: Self-pay | Admitting: Internal Medicine

## 2023-10-10 ENCOUNTER — Encounter: Payer: Self-pay | Admitting: Orthopedic Surgery

## 2023-10-10 DIAGNOSIS — G8929 Other chronic pain: Secondary | ICD-10-CM

## 2023-10-11 ENCOUNTER — Other Ambulatory Visit: Payer: Self-pay

## 2023-10-11 ENCOUNTER — Ambulatory Visit: Payer: 59 | Attending: Orthopaedic Surgery

## 2023-10-11 DIAGNOSIS — M2560 Stiffness of unspecified joint, not elsewhere classified: Secondary | ICD-10-CM | POA: Insufficient documentation

## 2023-10-11 DIAGNOSIS — Z96651 Presence of right artificial knee joint: Secondary | ICD-10-CM | POA: Insufficient documentation

## 2023-10-11 DIAGNOSIS — M6281 Muscle weakness (generalized): Secondary | ICD-10-CM | POA: Insufficient documentation

## 2023-10-11 DIAGNOSIS — R269 Unspecified abnormalities of gait and mobility: Secondary | ICD-10-CM | POA: Insufficient documentation

## 2023-10-11 DIAGNOSIS — R262 Difficulty in walking, not elsewhere classified: Secondary | ICD-10-CM | POA: Insufficient documentation

## 2023-10-11 DIAGNOSIS — M25561 Pain in right knee: Secondary | ICD-10-CM | POA: Diagnosis not present

## 2023-10-11 MED ORDER — HYDROMORPHONE HCL 4 MG PO TABS
4.0000 mg | ORAL_TABLET | ORAL | 0 refills | Status: DC | PRN
Start: 2023-10-11 — End: 2023-10-26

## 2023-10-11 NOTE — Therapy (Signed)
OUTPATIENT PHYSICAL THERAPY LOWER EXTREMITY EVALUATION   Patient Name: Harold Smith MRN: [id number] DOB:February 15, 1966, 57 y.o., male Today's Date: 10/12/2023  END OF SESSION:  PT End of Session - 10/11/23 0947     Visit Number 1    Number of Visits 14    Authorization Type [employer] 2024    Authorization - Visit Number 1    Authorization - Number of Visits 14    Progress Note Due on Visit 10    PT Start Time 0934    PT Stop Time 1014    PT Time Calculation (min) 40 min    Activity Tolerance Patient tolerated treatment well;No increased pain    Behavior During Therapy WFL for tasks assessed/performed             Past Medical History:  Diagnosis Date   Allergic rhinitis due to pollen    Anemia    Anxiety    Chronic pain    Colon polyps    Depression    Diverticulitis large intestine    GERD (gastroesophageal reflux disease)    HTN (hypertension)    Irritable bowel syndrome    Metabolic bone disease 09/01/2022   Obesity    Osteoarthritis, knee    Sleep apnea    does not use cpap, pt states he no longer needed CPAP due to weight loss   Sleep disturbance    Vitamin D deficiency 09/01/2022   Past Surgical History:  Procedure Laterality Date   ABDOMINAL EXPOSURE N/A 03/06/2023   Procedure: ABDOMINAL EXPOSURE;  Surgeon: Cephus Shelling, MD;  Location: [facility] OR;  Service: Vascular;  Laterality: N/A;   ACHILLES TENDON REPAIR Right 12/26/2006   ANTERIOR LUMBAR FUSION N/A 03/06/2023   Procedure: L5-S1 ANTERIOR LUMBAR FUSION 1 LEVEL;  Surgeon: London Sheer, MD;  Location: [facility] OR;  Service: Orthopedics;  Laterality: N/A;   BIOPSY  08/29/2023   Procedure: BIOPSY;  Surgeon: Toney Reil, MD;  Location: [facility] ENDOSCOPY;  Service: Gastroenterology;;   CATARACT EXTRACTION W/PHACO Left 07/20/2021   Procedure: CATARACT EXTRACTION PHACO AND INTRAOCULAR LENS PLACEMENT (IOC) LEFT 2.11 00:24.0;  Surgeon: Galen Manila, MD;  Location: [facility] SURGERY  CNTR;  Service: Ophthalmology;  Laterality: Left;  sleep apnea   CATARACT EXTRACTION W/PHACO Right 08/03/2021   Procedure: CATARACT EXTRACTION PHACO AND INTRAOCULAR LENS PLACEMENT (IOC) RIGHT;  Surgeon: Galen Manila, MD;  Location: [facility] SURGERY CNTR;  Service: Ophthalmology;  Laterality: Right;  3.19 0:33.0   CHOLECYSTECTOMY     COLON RESECTION Left 12/26/2004   due to diverticular disease at [facility]   COLONOSCOPY  2013?   COLONOSCOPY N/A 11/30/2022   Procedure: COLONOSCOPY;  Surgeon: Toney Reil, MD;  Location: [facility] ENDOSCOPY;  Service: Gastroenterology;  Laterality: N/A;   COLONOSCOPY WITH PROPOFOL N/A 05/22/2018   Procedure: COLONOSCOPY WITH PROPOFOL;  Surgeon: Toney Reil, MD;  Location: [facility] ENDOSCOPY;  Service: Gastroenterology;  Laterality: N/A;   COLONOSCOPY WITH PROPOFOL N/A 10/22/2020   Procedure: COLONOSCOPY WITH PROPOFOL;  Surgeon: Toney Reil, MD;  Location: [facility] SURGERY CNTR;  Service: Endoscopy;  Laterality: N/A;  priority 4   COLONOSCOPY WITH PROPOFOL N/A 11/10/2021   Procedure: COLONOSCOPY WITH PROPOFOL;  Surgeon: Toney Reil, MD;  Location: [facility] ENDOSCOPY;  Service: Gastroenterology;  Laterality: N/A;   COLONOSCOPY WITH PROPOFOL N/A 06/16/2022   Procedure: COLONOSCOPY WITH PROPOFOL;  Surgeon: Toney Reil, MD;  Location: [facility] SURGERY CNTR;  Service: Endoscopy;  Laterality: N/A;   COLONOSCOPY WITH PROPOFOL N/A 11/29/2022  Procedure: COLONOSCOPY WITH PROPOFOL;  Surgeon: Toney Reil, MD;  Location: [facility] ENDOSCOPY;  Service: Gastroenterology;  Laterality: N/A;   DRUG INDUCED ENDOSCOPY N/A 06/10/2019   Procedure: DRUG INDUCED SLEEP ENDOSCOPY;  Surgeon: Osborn Coho, MD;  Location: [facility];  Service: ENT;  Laterality: N/A;   ESOPHAGOGASTRODUODENOSCOPY (EGD) WITH PROPOFOL N/A 06/16/2022   Procedure: ESOPHAGOGASTRODUODENOSCOPY (EGD) WITH PROPOFOL;  Surgeon: Toney Reil, MD;  Location: [facility]  [facility];  Service: Endoscopy;  Laterality: N/A;   ESOPHAGOGASTRODUODENOSCOPY (EGD) WITH PROPOFOL N/A 05/24/2023   Procedure: ESOPHAGOGASTRODUODENOSCOPY (EGD) WITH PROPOFOL;  Surgeon: Toney Reil, MD;  Location: [facility] ENDOSCOPY;  Service: Gastroenterology;  Laterality: N/A;   ESOPHAGOGASTRODUODENOSCOPY (EGD) WITH PROPOFOL N/A 08/29/2023   Procedure: ESOPHAGOGASTRODUODENOSCOPY (EGD) WITH PROPOFOL;  Surgeon: Toney Reil, MD;  Location: [facility] ENDOSCOPY;  Service: Gastroenterology;  Laterality: N/A;   GASTRIC BYPASS N/A 12/27/2007   HARDWARE REMOVAL Right 06/22/2023   Procedure: HARDWARE REMOVAL;  Surgeon: Myrene Galas, MD;  Location: [facility] OR;  Service: Orthopedics;  Laterality: Right;   HIATAL HERNIA REPAIR  09/25/2014   at [facility]   KNEE ARTHROSCOPY Right 06/22/2023   Procedure: ARTHROSCOPY KNEE;  Surgeon: Myrene Galas, MD;  Location: [facility] OR;  Service: Orthopedics;  Laterality: Right;   ORIF FEMUR FRACTURE Right 08/30/2022   Procedure: OPEN REDUCTION INTERNAL FIXATION (ORIF) DISTAL FEMUR FRACTURE;  Surgeon: Myrene Galas, MD;  Location: [facility] OR;  Service: Orthopedics;  Laterality: Right;   POLYPECTOMY  10/22/2020   Procedure: POLYPECTOMY;  Surgeon: Toney Reil, MD;  Location: [facility] SURGERY CNTR;  Service: Endoscopy;;   POLYPECTOMY  06/16/2022   Procedure: POLYPECTOMY;  Surgeon: Toney Reil, MD;  Location: [facility] SURGERY CNTR;  Service: Endoscopy;;   ROTATOR CUFF REPAIR Right    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   TOTAL KNEE ARTHROPLASTY Right 09/19/2023   Procedure: RIGHT TOTAL KNEE ARTHROPLASTY;  Surgeon: Kathryne Hitch, MD;  Location: [facility] OR;  Service: Orthopedics;  Laterality: Right;   Patient Active Problem List   Diagnosis Date Noted   S/P total knee arthroplasty, right 09/19/2023   Status post total right knee replacement 09/19/2023   Chronic pain syndrome 07/12/2023   Anxiety 09/01/2022   Vitamin D deficiency 09/01/2022   Metabolic bone  disease  09/01/2022   Displaced supracondylar fracture of distal end of right femur without intracondylar extension (HCC) 08/29/2022   Mood disorder (HCC) 07/08/2022   Erosive esophagitis    Disability of walking 06/01/2022   Degeneration of lumbar intervertebral disc 06/01/2022   Iron deficiency anemia 05/10/2022   Lumbar spondylolysis 02/25/2022   Sleep disturbance 07/02/2021   Preventative health care 06/23/2018   Personal hx of gastric bypass 06/23/2018   Erectile dysfunction 06/23/2018   Hx of adenomatous colonic polyps    Essential hypertension 10/17/2013   Diverticulosis large intestine w/o perforation or abscess w/bleeding    Colon polyps     PCP: Dr. Tillman Abide  REFERRING PROVIDER: Dr. Doneen Poisson  REFERRING DIAG: [phone] (ICD-10-CM) - Status post total right knee replacement   THERAPY DIAG:  Muscle weakness (generalized)  Limited joint range of motion (ROM)  Difficulty in walking, not elsewhere classified  Abnormality of gait and mobility  Acute pain of right knee  Rationale for Evaluation and Treatment: Rehabilitation  ONSET DATE: 09/19/2023  SUBJECTIVE:   SUBJECTIVE STATEMENT: Patient reports having Right TKA on 09/19/2023 due to progressive right knee pain. States he went to [facility] on [address] for some Low back pain  and some knee rehab prior to surgery. He reports ongoing swelling and right knee pain following surgery affecting his mobility. States most recently transitioned to cane.   PERTINENT HISTORY:  Per ortho note from Dr. Magnus Ivan: Ilona Sorrel, 57 y.o. male, has a history of pain and functional disability in the right knee due to trauma and has failed non-surgical conservative treatments for greater than 12 weeks to includeNSAID's and/or analgesics, corticosteriod injections, use of assistive devices, and activity modification.  Onset of symptoms was abrupt, starting 1 years ago with gradually worsening course since that time. The  patient noted prior procedures on the knee to include  ORIF  on the right knee(s).  Per previous admission chart review from Lesli Albee, PT, DPT: Relevant past medical history and comorbidities include s/p hardware removal and arthroscopy of R knee on 06/22/2023 (hardware originally for distal medial condyle fracture);  s/p L5-S1 ANTERIOR LUMBAR FUSION on 03/06/2023; Diverticulosis large intestine w/o perforation or abscess w/bleeding; Essential hypertension; Rectal bleeding; Hx of adenomatous colonic polyps; Personal hx of gastric bypass; Erectile dysfunction; Sleep disturbance; Lumbar spondylolysis; Microcytic anemia; Iron deficiency anemia; Numbness and tingling; Imbalance; Difficulty sleeping; Disability of walking; Pain in joint involving ankle and foot; Degeneration of lumbar intervertebral disc; Erosive esophagitis; Mood disorder (HCC); Displaced supracondylar fracture of distal end of right femur without intracondylar extension (HCC); Anxiety; Vitamin D deficiency; Metabolic bone disease; and Radiculopathy, lumbar region. Please see above for relevant surgery history; osteoporosis, bowel/urinary incontinence, stumbling and dropping things. Patient denies hx of cancer, stroke, seizures, lung problems, heart problems, diabetes, and unexplained weight loss.   PAIN:  Are you having pain? Yes: NPRS scale: 7-8/10 Pain location: Right knee Pain description: ache- sharp Aggravating factors: bending, sitting, standing , walking Relieving factors: rest, meds  PRECAUTIONS: Knee  RED FLAGS: Bowel or bladder incontinence: Yes: bowel     WEIGHT BEARING RESTRICTIONS: No  FALLS:  Has patient fallen in last 6 months? 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  NEXT MD VISIT: Next month- Dr. Magnus Ivan  OBJECTIVE:  Note: Objective measures were completed at Evaluation unless otherwise noted.  DIAGNOSTIC FINDINGS:  Narrative & Impression  CLINICAL DATA:  Status post right knee replacement.   EXAM: PORTABLE RIGHT KNEE - 1-2 VIEW   COMPARISON:  06/22/2023   FINDINGS: Right knee arthroplasty in expected alignment. No periprosthetic lucency or fracture. There has been patellar resurfacing. Recent postsurgical change includes air and edema in the soft tissues and joint space. Chronic ghost tracks in the distal femur. Anterior skin staples in place.   IMPRESSION: Right knee arthroplasty without immediate postoperative complication.     Electronically Signed   By: Narda Rutherford M.D.   On: 09/19/2023 16:43    PATIENT SURVEYS:  FOTO 39 with goal of 54  COGNITION: Overall cognitive status: Within functional limits for tasks assessed     SENSATION: Light touch: Impaired - reported dead zone lateral joint line  EDEMA:  Circumferential:  1)bottom of incision=38; 2)10 cm above distal incision-=48, 3) top of incision= 49    POSTURE: No Significant postural limitations  PALPATION: Global tenderness along right knee- joint line and posterior knee  LOWER EXTREMITY ROM:  Active ROM Right eval Left eval  Hip flexion    Hip extension    Hip abduction    Hip adduction    Hip internal rotation    Hip external rotation    Knee flexion 90deg   Knee extension Lack 12 deg   Ankle dorsiflexion    Ankle plantarflexion    Ankle inversion    Ankle eversion     (Blank rows = not tested)  LOWER EXTREMITY MMT:  MMT Right eval Left eval  Hip flexion 4 5  Hip extension    Hip abduction 4 5  Hip adduction    Hip internal rotation    Hip external rotation    Knee flexion 3+ in available ROM 5  Knee extension 3+  in available ROM 5  Ankle dorsiflexion 4 5  Ankle plantarflexion    Ankle inversion    Ankle eversion     (Blank rows = not tested)  LOWER EXTREMITY SPECIAL TESTS:  N/a  FUNCTIONAL TESTS:  5 times sit to stand: Will assess visit #2 Timed up and go (TUG): Will assess visit #2 10 meter walk test: Will assess visit #2  GAIT: Distance walked: 75 feet Assistive device utilized: Single point cane Level of assistance: SBA Comments: antalgic on right- limited right knee flex and push off   TODAY'S TREATMENT:                                                                                                                              DATE: PT EVALUATION and INSTRUCTION/REVIEW in Some basic LE exercises:  Ankle pumps (instructed to have leg elevated above heart- positioned with pillows) x 20 Quad sets with 5 sec hold x 10  Heel slides x 10  Hip abd x 10 Straight leg raise (SLR) - 10 reps   Knee flex stretch (Seated) - hold 30 sec x2  Knee ext stretch (seated)- hold 30 sec x 2   PATIENT EDUCATION:  Education details: Purpose of PT, HEP, Pain management strategies including Ice, ROM, taking meds as prescribed.  Person educated: Patient Education method: Explanation, Demonstration, Tactile cues, Verbal cues, and Handouts Education comprehension: verbalized understanding, returned demonstration, verbal cues required, tactile cues required, and needs further education  HOME EXERCISE PROGRAM: Access Code: [facility] URL: [email or web address] Date: 10/11/2023 Prepared by: Maureen Ralphs  Exercises - Seated Hamstring Stretch  - 2 x daily - 3-5 sets - 30-45 hold - Seated Knee Flexion Stretch  - 2 x daily - 3-5 sets - 30-45 hold  ASSESSMENT:  CLINICAL IMPRESSION: Patient is a 57 y.o. male who was seen today for physical therapy evaluation and treatment for s/p Right Total knee replacement. Patient presents with swollen and painful right knee with limited ROM and  muscle weakness. He is currently ambulating limited distances with cane. He will benefit from skilled PT services to improve his knee range of motion, LE strength, pain, and all functional  mobility to enable him to return to independent walking and previous level of function.    OBJECTIVE IMPAIRMENTS: Abnormal gait, decreased activity tolerance, decreased balance, decreased endurance, decreased mobility, difficulty walking, decreased ROM, decreased strength, hypomobility, impaired flexibility, and pain.   ACTIVITY LIMITATIONS: carrying, lifting, bending, sitting, standing, squatting, sleeping, stairs, transfers, bed mobility, and toileting  PARTICIPATION LIMITATIONS: meal prep, cleaning, laundry, driving, shopping, community activity, occupation, and yard work  PERSONAL FACTORS: 1-2 comorbidities: chronic pain, HTN  are also affecting patient's functional outcome.   REHAB POTENTIAL: Good  CLINICAL DECISION MAKING: Stable/uncomplicated  EVALUATION COMPLEXITY: Low   GOALS: Goals reviewed with patient? Yes  SHORT TERM GOALS: Target date: 11/08/2023 Pt will be independent with HEP in order to decrease ankle pain and increase strength in order to improve pain-free function at home and work.  Baseline: EVAL- No formal HEP in place Goal status: INITIAL   LONG TERM GOALS: Target date: 12/06/2023  Pt will decrease worst pain as reported on NPRS by at least 3 points in order to demonstrate clinically significant reduction in ankle/foot pain.  Baseline: EVAL- R knee pain = 7-8/10 Goal status: INITIAL  2.  Pt will improve FOTO to target score of 54 to display perceived improvements in ability to complete ADL's.  Baseline: EVAL= 39 Goal status: INITIAL  3.  Patient will improve Right knee AROM extension to <5 deg from zero for improved ability to straighten his leg with all mobility.  Baseline: EVAL= 12 deg Goal status: INITIAL  4.  Patient will improve Right knee AROM Flexion to > 120 deg  for optimal right knee mobility with steps and sitting.  Baseline: EVAL= 90 deg Goal status: INITIAL      5.  Patient (> 47 years old) will complete five times sit to stand test in < 15 seconds indicating an increased LE strength and improved balance. Baseline: EVAL: to be assessed next visit Goal status: INITIAL     6.   Patient will reduce timed up and go to <11 seconds to reduce fall risk and demonstrate improved transfer/gait ability. Baseline: EVAL: To be assessed next visit Goal status: INITIAL  7.   Patient will increase 10 meter walk test to >1.48m/s as to improve gait speed for better community ambulation and to reduce fall risk. Baseline: EVAL- To be assessed next visit Goal status: INITIAL    PLAN:  PT FREQUENCY: 1-2x/week  PT DURATION: 8 weeks  PLANNED INTERVENTIONS: 97164- PT Re-evaluation, 97110-Therapeutic exercises, 97530- Therapeutic activity, [id number]- Neuromuscular re-education, 97535- Self Care, 16109- Manual therapy, [id number]- Gait training, [phone]- Orthotic Fit/training, [phone]- Electrical stimulation (manual), [phone]- Ultrasound, Patient/Family education, Balance training, Stair training, Taping, Dry Needling, Joint mobilization, Manual lymph drainage, Scar mobilization, Compression bandaging, Cryotherapy, and Moist heat  PLAN FOR NEXT SESSION: Continue with testing - 5xSTS, 10 MWT, TUG, Manual therapy for ROM, TE for strengthening.    Lenda Kelp, PT 10/12/2023, 8:02 AM

## 2023-10-16 ENCOUNTER — Ambulatory Visit: Payer: 59

## 2023-10-16 ENCOUNTER — Ambulatory Visit (INDEPENDENT_AMBULATORY_CARE_PROVIDER_SITE_OTHER): Payer: 59 | Admitting: Internal Medicine

## 2023-10-16 ENCOUNTER — Encounter: Payer: Self-pay | Admitting: Internal Medicine

## 2023-10-16 VITALS — BP 136/88 | HR 97 | Ht 70.0 in | Wt 180.0 lb

## 2023-10-16 DIAGNOSIS — F39 Unspecified mood [affective] disorder: Secondary | ICD-10-CM | POA: Diagnosis not present

## 2023-10-16 DIAGNOSIS — Z23 Encounter for immunization: Secondary | ICD-10-CM

## 2023-10-16 DIAGNOSIS — M25561 Pain in right knee: Secondary | ICD-10-CM

## 2023-10-16 DIAGNOSIS — R262 Difficulty in walking, not elsewhere classified: Secondary | ICD-10-CM | POA: Diagnosis not present

## 2023-10-16 DIAGNOSIS — R269 Unspecified abnormalities of gait and mobility: Secondary | ICD-10-CM

## 2023-10-16 DIAGNOSIS — Z7689 Persons encountering health services in other specified circumstances: Secondary | ICD-10-CM | POA: Diagnosis not present

## 2023-10-16 DIAGNOSIS — G894 Chronic pain syndrome: Secondary | ICD-10-CM | POA: Diagnosis not present

## 2023-10-16 DIAGNOSIS — M6281 Muscle weakness (generalized): Secondary | ICD-10-CM

## 2023-10-16 DIAGNOSIS — M2560 Stiffness of unspecified joint, not elsewhere classified: Secondary | ICD-10-CM

## 2023-10-16 DIAGNOSIS — Z96651 Presence of right artificial knee joint: Secondary | ICD-10-CM | POA: Diagnosis not present

## 2023-10-16 MED ORDER — CYCLOBENZAPRINE HCL 5 MG PO TABS: 5.0000 mg | ORAL_TABLET | Freq: Three times a day (TID) | ORAL | 1 refills | Status: DC | PRN

## 2023-10-16 NOTE — Progress Notes (Signed)
Subjective:    Patient ID: Harold Smith, male    DOB: [birth year], 57 y.o.   MRN: [id number]  HPI Here for follow up of chronic pain  Had right TKR a month ago Still some swelling--though improved Doing the PT now  Dr Raynelle Dick surgeon--trying to get CT myelogram Awaiting approval Has referred him to pain management Not able to sleep Really suffering Now getting dilaudid from the surgeon--that at least helps Muscle relaxers do help---but he ran out. Does help him flex the knee more On duloxetine still  Daughter and grandkids are with him now--- 6 and 2 year olds She does help a lot  Mood is still an issue  Mostly related to pain  Current Outpatient Medications on File Prior to Visit  Medication Sig Dispense Refill   alendronate (FOSAMAX) 70 MG tablet Take 1 tablet (70 mg total) by mouth once a week. Take with a full glass of water on an empty stomach. 10 tablet 0   aspirin 81 MG chewable tablet Chew 1 tablet (81 mg total) by mouth 2 (two) times daily. 30 tablet 0   baclofen (LIORESAL) 10 MG tablet Take 1 tablet (10 mg total) by mouth 3 (three) times daily. 90 tablet 0   CALCIUM PO Take 1 tablet by mouth daily.     carboxymethylcellulose (REFRESH PLUS) 0.5 % SOLN Place 1 drop into both eyes daily as needed (dry eyes).     cetirizine (ZYRTEC) 10 MG tablet Take 10 mg by mouth daily.     Cholecalciferol 125 MCG (5000 UT) TABS Take 1 tablet by mouth daily. (Patient taking differently: Take 5,000 Units by mouth daily.) 30 tablet 6   Cyanocobalamin (VITAMIN B-12 PO) Take 1 tablet by mouth daily.     dicyclomine (BENTYL) 10 MG capsule Take 1 to 2 capsules as needed for abdominal cramps and diarrhea every 6 to 8 hours 240 capsule 3   doxycycline (VIBRA-TABS) 100 MG tablet Take 1 tablet (100 mg total) by mouth 2 (two) times daily. 30 tablet 0   DULoxetine (CYMBALTA) 60 MG capsule TAKE 1 CAPSULE BY MOUTH ONCE DAILY 90 capsule 3   ferrous sulfate (FEROSUL) 325 (65 FE) MG tablet  TAKE ONE TABLET EACH MORNING WITH BREAKFAST 30 tablet 11   fluticasone (FLONASE) 50 MCG/ACT nasal spray Place 2 sprays into both nostrils daily. (Patient taking differently: Place 2 sprays into both nostrils daily as needed for allergies or rhinitis.) 48 g 3   gabapentin (NEURONTIN) 300 MG capsule TAKE 1 CAPSULE BY MOUTH TWICE DAILY AND TAKE 4 CAPSULES AT BEDTIME (Patient taking differently: Take 300-1,200 mg by mouth 3 (three) times daily as needed (pain).) 180 capsule 5   hydrochlorothiazide (HYDRODIURIL) 25 MG tablet TAKE 1 TABLET BY MOUTH DAILY 90 tablet 3   hydrocortisone (ANUSOL-HC) 25 MG suppository Place 1 suppository (25 mg total) rectally daily. 14 suppository 0   HYDROmorphone (DILAUDID) 4 MG tablet Take 1 tablet (4 mg total) by mouth every 4 (four) hours as needed for up to 5 days for severe pain (pain score 7-10). 20 tablet 0   lipase/protease/amylase (CREON) 36000 UNITS CPEP capsule Take 2 capsules (72,000 Units total) by mouth 3 (three) times daily with meals. May also take 1 capsule (36,000 Units total) as needed (with snacks). (Patient taking differently: Take 36,000 units by mouth 3 (three) times daily as needed based on meal size and type ) 240 capsule 5   lisinopril (ZESTRIL) 20 MG tablet TAKE 1 TABLET BY MOUTH DAILY  90 tablet 3   meloxicam (MOBIC) 15 MG tablet TAKE 1 TABLET BY MOUTH DAILY AS NEEDED FOR PAIN 90 tablet 1   Multiple Vitamin (MULTIVITAMIN) tablet Take 1 tablet by mouth daily.     naloxone (NARCAN) nasal spray 4 mg/0.1 mL Place 1 spray into the nose once.     omeprazole (PRILOSEC) 40 MG capsule Take 1 capsule (40 mg total) by mouth 2 (two) times daily before a meal. 60 capsule 2   QUEtiapine (SEROQUEL) 25 MG tablet TAKE 1-2 TABLETS BY MOUTH AT BEDTIME AS NEEDED FOR SLEEP 60 tablet 0   Sodium Fluoride (PREVIDENT 5000 BOOSTER PLUS DT) Place 1 application  onto teeth 2 (two) times daily.     tadalafil (CIALIS) 20 MG tablet Take 1 tablet (20 mg total) by mouth daily as  needed for erectile dysfunction. 10 tablet 11   traZODone (DESYREL) 50 MG tablet Take 1-2 tablets (50-100 mg total) by mouth at bedtime as needed for sleep. 180 tablet 1   No current facility-administered medications on file prior to visit.    No Known Allergies  Past Medical History:  Diagnosis Date   Allergic rhinitis due to pollen    Anemia    Anxiety    Chronic pain    Colon polyps    Depression    Diverticulitis large intestine    GERD (gastroesophageal reflux disease)    HTN (hypertension)    Irritable bowel syndrome    Metabolic bone disease 09/01/2022   Obesity    Osteoarthritis, knee    Sleep apnea    does not use cpap, pt states he no longer needed CPAP due to weight loss   Sleep disturbance    Vitamin D deficiency 09/01/2022    Past Surgical History:  Procedure Laterality Date   ABDOMINAL EXPOSURE N/A 03/06/2023   Procedure: ABDOMINAL EXPOSURE;  Surgeon: Cephus Shelling, MD;  Location[facility] OR;  Service: Vascular;  Laterality: N/A;   ACHILLES TENDON REPAIR Right 12/26/2006   ANTERIOR LUMBAR FUSION N/A 03/06/2023   Procedure: L5-S1 ANTERIOR LUMBAR FUSION 1 LEVEL;  Surgeon: London Sheer, MD;  Location: [facility] OR;  Service: Orthopedics;  Laterality: N/A;   BIOPSY  08/29/2023   Procedure: BIOPSY;  Surgeon: Toney Reil, MD;  Location: [facility] ENDOSCOPY;  Service: Gastroenterology;;   CATARACT EXTRACTION W/PHACO Left 07/20/2021   Procedure: CATARACT EXTRACTION PHACO AND INTRAOCULAR LENS PLACEMENT (IOC) LEFT 2.11 00:24.0;  Surgeon: Galen Manila, MD;  Location: [facility];  Service: Ophthalmology;  Laterality: Left;  sleep apnea   CATARACT EXTRACTION W/PHACO Right 08/03/2021   Procedure: CATARACT EXTRACTION PHACO AND INTRAOCULAR LENS PLACEMENT (IOC) RIGHT;  Surgeon: Galen Manila, MD;  Location: [facility];  Service: Ophthalmology;  Laterality: Right;  3.19 0:33.0   CHOLECYSTECTOMY     COLON RESECTION Left 12/26/2004   due to  diverticular disease at [facility]   COLONOSCOPY  2013?   COLONOSCOPY N/A 11/30/2022   Procedure: COLONOSCOPY;  Surgeon: Toney Reil, MD;  Location: [facility] ENDOSCOPY;  Service: Gastroenterology;  Laterality: N/A;   COLONOSCOPY WITH PROPOFOL N/A 05/22/2018   Procedure: COLONOSCOPY WITH PROPOFOL;  Surgeon: Toney Reil, MD;  Location: [facility] ENDOSCOPY;  Service: Gastroenterology;  Laterality: N/A;   COLONOSCOPY WITH PROPOFOL N/A 10/22/2020   Procedure: COLONOSCOPY WITH PROPOFOL;  Surgeon: Toney Reil, MD;  Location: [facility];  Service: Endoscopy;  Laterality: N/A;  priority 4   COLONOSCOPY WITH PROPOFOL N/A 11/10/2021   Procedure: COLONOSCOPY WITH PROPOFOL;  Surgeon:  Toney Reil, MD;  Location: [facility] ENDOSCOPY;  Service: Gastroenterology;  Laterality: N/A;   COLONOSCOPY WITH PROPOFOL N/A 06/16/2022   Procedure: COLONOSCOPY WITH PROPOFOL;  Surgeon: Toney Reil, MD;  Location: [facility];  Service: Endoscopy;  Laterality: N/A;   COLONOSCOPY WITH PROPOFOL N/A 11/29/2022   Procedure: COLONOSCOPY WITH PROPOFOL;  Surgeon: Toney Reil, MD;  Location: [facility] ENDOSCOPY;  Service: Gastroenterology;  Laterality: N/A;   DRUG INDUCED ENDOSCOPY N/A 06/10/2019   Procedure: DRUG INDUCED SLEEP ENDOSCOPY;  Surgeon: Osborn Coho, MD;  Location: [facility];  Service: ENT;  Laterality: N/A;   ESOPHAGOGASTRODUODENOSCOPY (EGD) WITH PROPOFOL N/A 06/16/2022   Procedure: ESOPHAGOGASTRODUODENOSCOPY (EGD) WITH PROPOFOL;  Surgeon: Toney Reil, MD;  Location: [facility] SUR[facility]ERY [facility];  Service: Endoscopy;  Laterality: N/A;   ESOPHAGOGASTRODUODENOSCOPY (EGD) WITH PROPOFOL N/A 05/24/2023   Procedure: ESOPHAGOGASTRODUODENOSCOPY (EGD) WITH PROPOFOL;  Surgeon: Toney Reil, MD;  Location: [facility] ENDOSCOPY;  Service: Gastroenterology;  Laterality: N/A;   ESOPHAGOGASTRODUODENOSCOPY (EGD) WITH PROPOFOL N/A 08/29/2023   Procedure:  ESOPHAGOGASTRODUODENOSCOPY (EGD) WITH PROPOFOL;  Surgeon: Toney Reil, MD;  Location: [facility] END[facility]COPY;  Service: Gastroenterology;  Laterality: N/A;   GASTRIC BYPASS N/A 12/27/2007   HARDWARE REMOVAL Right 06/22/2023   Procedure: HARDWARE REMOVAL;  Surgeon: Myrene Galas, MD;  Location: [facility] OR;  Service: Orthopedics;  Laterality: Right;   HIATAL HERNIA REPAIR  09/25/2014   at [facility]   KNEE ARTHROSCOPY Right 06/22/2023   Procedure: ARTHROSCOPY KNEE;  Surgeon: Myrene Galas, MD;  Location: [facility] OR;  Service: Orthopedics;  Laterality: Right;   ORIF FEMUR FRACTURE Right 08/30/2022   Procedure: OPEN REDUCTION INTERNAL FIXATION (ORIF) DISTAL FEMUR FRACTURE;  Surgeon: Myrene Galas, MD;  Location: [facility] OR;  Service: Orthopedics;  Laterality: Right;   POLYPECTOMY  10/22/2020   Procedure: POLYPECTOMY;  Surgeon: Toney Reil, MD;  Location: [facility];  Service: Endoscopy;;   POLYPECTOMY  06/16/2022   Procedure: POLYPECTOMY;  Surgeon: Toney Reil, MD;  Location: [facility] SUR[facility]ERY [facility];  Service: Endoscopy;;   ROTATOR CUFF REPAIR Right    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   TOTAL KNEE ARTHROPLASTY Right 09/19/2023   Procedure: RIGHT TOTAL KNEE ARTHROPLASTY;  Surgeon: Kathryne Hitch, MD;  Location: [facility] OR;  Service: Orthopedics;  Laterality: Right;    Family History  Problem Relation Age of Onset   Hypertension Mother    Diabetes Mother    Kidney disease Mother    Heart disease Mother    Colon cancer Father 25   Asthma Sister    Obesity Sister    Stroke Sister    Obesity Brother    Diabetes Other    Heart disease Other    Hypertension Other    Kidney disease Other    Cancer Paternal Uncle        unk type, possible prostate   Lung cancer Paternal Grandmother     Social History   Socioeconomic History   Marital status: Single    Spouse name: Not on file   Number of children: 3   Years of education: Not on file   Highest education level:  Some college, no degree  Occupational History   Occupation: Hydrographic surveyor  Tobacco Use   Smoking status: Some Days    Types: Cigars    Passive exposure: Past   Smokeless tobacco: Never   Tobacco comments:    occasional cigar  Vaping Use   Vaping status: Never Used  Substance and Sexual  Activity   Alcohol use: Yes    Comment: Occasionally   Drug use: No   Sexual activity: Yes  Other Topics Concern   Not on file  Social History Narrative   Not on file   Social Determinants of Health   Financial Resource Strain: Medium Risk (10/12/2023)   Overall Financial Resource Strain (CARDIA)    Difficulty of Paying Living Expenses: Somewhat hard  Food Insecurity: Food Insecurity Present (10/12/2023)   Hunger Vital Sign    Worried About Running Out of Food in the Last Year: Sometimes true    Ran Out of Food in the Last Year: Sometimes true  Transportation Needs: No Transportation Needs (10/12/2023)   PRAPARE - Administrator, Civil Service (Medical): No    Lack of Transportation (Non-Medical): No  Physical Activity: Unknown (10/12/2023)   Exercise Vital Sign    Days of Exercise per Week: 0 days    Minutes of Exercise per Session: Not on file  Stress: Stress Concern Present (10/12/2023)   [employer] of Occupational Health - Occupational Stress Questionnaire    Feeling of Stress : Very much  Social Connections: Moderately Integrated (10/12/2023)   Social Connection and Isolation Panel [NHANES]    Frequency of Communication with Friends and Family: More than three times a week    Frequency of Social Gatherings with Friends and Family: Once a week    Attends Religious Services: 1 to 4 times per year    Active Member of [employer] or Organizations: Yes    Attends Banker Meetings: 1 to 4 times per year    Marital Status: Divorced  Intimate Partner Violence: Not At Risk (03/06/2023)   Humiliation, Afraid, Rape, and Kick questionnaire    Fear of  Current or Ex-Partner: No    Emotionally Abused: No    Physically Abused: No    Sexually Abused: No   Review of Systems Eating okay Weight is holding    Objective:   Physical Exam Constitutional:      Appearance: Normal appearance.  Neurological:     Mental Status: He is alert.  Psychiatric:        Mood and Affect: Mood normal.        Behavior: Behavior normal.            Assessment & Plan:

## 2023-10-16 NOTE — Therapy (Signed)
OUTPATIENT PHYSICAL THERAPY LOWER EXTREMITY TREATMENT   Patient Name: Harold Smith MRN: [id number] DOB:Apr 11, 1966, 57 y.o., male Today's Date: 10/16/2023  END OF SESSION:  PT End of Session - 10/16/23 0826     Visit Number 2    Number of Visits 14    Date for PT Re-Evaluation 12/06/23    Authorization Type [employer] 2024    Authorization - Number of Visits 14    Progress Note Due on Visit 10    PT Start Time 0830    PT Stop Time 0905    PT Time Calculation (min) 35 min    Equipment Utilized During Treatment Gait belt    Activity Tolerance Patient tolerated treatment well;No increased pain    Behavior During Therapy WFL for tasks assessed/performed              Past Medical History:  Diagnosis Date   Allergic rhinitis due to pollen    Anemia    Anxiety    Chronic pain    Colon polyps    Depression    Diverticulitis large intestine    GERD (gastroesophageal reflux disease)    HTN (hypertension)    Irritable bowel syndrome    Metabolic bone disease 09/01/2022   Obesity    Osteoarthritis, knee    Sleep apnea    does not use cpap, pt states he no longer needed CPAP due to weight loss   Sleep disturbance    Vitamin D deficiency 09/01/2022   Past Surgical History:  Procedure Laterality Date   ABDOMINAL EXPOSURE N/A 03/06/2023   Procedure: ABDOMINAL EXPOSURE;  Surgeon: Cephus Shelling, MD;  Location: [facility] OR;  Service: Vascular;  Laterality: N/A;   ACHILLES TENDON REPAIR Right 12/26/2006   ANTERIOR LUMBAR FUSION N/A 03/06/2023   Procedure: L5-S1 ANTERIOR LUMBAR FUSION 1 LEVEL;  Surgeon: London Sheer, MD;  Location: [facility] OR;  Service: Orthopedics;  Laterality: N/A;   BIOPSY  08/29/2023   Procedure: BIOPSY;  Surgeon: Toney Reil, MD;  Location: [facility] ENDOSCOPY;  Service: Gastroenterology;;   CATARACT EXTRACTION W/PHACO Left 07/20/2021   Procedure: CATARACT EXTRACTION PHACO AND INTRAOCULAR LENS PLACEMENT (IOC) LEFT 2.11 00:24.0;   Surgeon: Galen Manila, MD;  Location: [facility] SURGERY CNTR;  Service: Ophthalmology;  Laterality: Left;  sleep apnea   CATARACT EXTRACTION W/PHACO Right 08/03/2021   Procedure: CATARACT EXTRACTION PHACO AND INTRAOCULAR LENS PLACEMENT (IOC) RIGHT;  Surgeon: Galen Manila, MD;  Location: [facility] SURGERY CNTR;  Service: Ophthalmology;  Laterality: Right;  3.19 0:33.0   CHOLECYSTECTOMY     COLON RESECTION Left 12/26/2004   due to diverticular disease at [facility]   COLONOSCOPY  2013?   COLONOSCOPY N/A 11/30/2022   Procedure: COLONOSCOPY;  Surgeon: Toney Reil, MD;  Location: [facility] ENDOSCOPY;  Service: Gastroenterology;  Laterality: N/A;   COLONOSCOPY WITH PROPOFOL N/A 05/22/2018   Procedure: COLONOSCOPY WITH PROPOFOL;  Surgeon: Toney Reil, MD;  Location: [facility] ENDOSCOPY;  Service: Gastroenterology;  Laterality: N/A;   COLONOSCOPY WITH PROPOFOL N/A 10/22/2020   Procedure: COLONOSCOPY WITH PROPOFOL;  Surgeon: Toney Reil, MD;  Location: [facility] SURGERY CNTR;  Service: Endoscopy;  Laterality: N/A;  priority 4   COLONOSCOPY WITH PROPOFOL N/A 11/10/2021   Procedure: COLONOSCOPY WITH PROPOFOL;  Surgeon: Toney Reil, MD;  Location: [facility] ENDOSCOPY;  Service: Gastroenterology;  Laterality: N/A;   COLONOSCOPY WITH PROPOFOL N/A 06/16/2022   Procedure: COLONOSCOPY WITH PROPOFOL;  Surgeon: Toney Reil, MD;  Location: [facility] SURGERY CNTR;  Service: Endoscopy;  Laterality: N/A;   COLONOSCOPY WITH PROPOFOL N/A 11/29/2022   Procedure: COLONOSCOPY WITH PROPOFOL;  Surgeon: Toney Reil, MD;  Location: [facility] ENDOSCOPY;  Service: Gastroenterology;  Laterality: N/A;   DRUG INDUCED ENDOSCOPY N/A 06/10/2019   Procedure: DRUG INDUCED SLEEP ENDOSCOPY;  Surgeon: Osborn Coho, MD;  Location: [facility];  Service: ENT;  Laterality: N/A;   ESOPHAGOGASTRODUODENOSCOPY (EGD) WITH PROPOFOL N/A 06/16/2022   Procedure: ESOPHAGOGASTRODUODENOSCOPY (EGD) WITH PROPOFOL;   Surgeon: Toney Reil, MD;  Location: [facility] SURGERY CNTR;  Service: Endoscopy;  Laterality: N/A;   ESOPHAGOGASTRODUODENOSCOPY (EGD) WITH PROPOFOL N/A 05/24/2023   Procedure: ESOPHAGOGASTRODUODENOSCOPY (EGD) WITH PROPOFOL;  Surgeon: Toney Reil, MD;  Location: [facility] ENDOSCOPY;  Service: Gastroenterology;  Laterality: N/A;   ESOPHAGOGASTRODUODENOSCOPY (EGD) WITH PROPOFOL N/A 08/29/2023   Procedure: ESOPHAGOGASTRODUODENOSCOPY (EGD) WITH PROPOFOL;  Surgeon: Toney Reil, MD;  Location: [facility] ENDOSCOPY;  Service: Gastroenterology;  Laterality: N/A;   GASTRIC BYPASS N/A 12/27/2007   HARDWARE REMOVAL Right 06/22/2023   Procedure: HARDWARE REMOVAL;  Surgeon: Myrene Galas, MD;  Location: [facility] OR;  Service: Orthopedics;  Laterality: Right;   HIATAL HERNIA REPAIR  09/25/2014   at [facility]   KNEE ARTHROSCOPY Right 06/22/2023   Procedure: ARTHROSCOPY KNEE;  Surgeon: Myrene Galas, MD;  Location: [facility] OR;  Service: Orthopedics;  Laterality: Right;   ORIF FEMUR FRACTURE Right 08/30/2022   Procedure: OPEN REDUCTION INTERNAL FIXATION (ORIF) DISTAL FEMUR FRACTURE;  Surgeon: Myrene Galas, MD;  Location: [facility] OR;  Service: Orthopedics;  Laterality: Right;   POLYPECTOMY  10/22/2020   Procedure: POLYPECTOMY;  Surgeon: Toney Reil, MD;  Location: [facility] SURGERY [facility]TR;  Service: Endoscopy;;   POLYPECTOMY  06/16/2022   Procedure: POLYPECTOMY;  Surgeon: Toney Reil, MD;  Location: [facility] SURGERY [facility]TR;  Service: Endoscopy;;   ROTATOR CUFF REPAIR Right    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   TOTAL KNEE ARTHROPLASTY Right 09/19/2023   Procedure: RIGHT TOTAL KNEE ARTHROPLASTY;  Surgeon: Kathryne Hitch, MD;  Location: [facility] OR;  Service: Orthopedics;  Laterality: Right;   Patient Active Problem List   Diagnosis Date Noted   S/P total knee arthroplasty, right 09/19/2023   Status post total right knee replacement 09/19/2023   Chronic pain syndrome 07/12/2023   Anxiety  09/01/2022   Vitamin D deficiency 09/01/2022   Metabolic bone disease  09/01/2022   Displaced supracondylar fracture of distal end of right femur without intracondylar extension (HCC) 08/29/2022   Mood disorder (HCC) 07/08/2022   Erosive esophagitis    Disability of walking 06/01/2022   Degeneration of lumbar intervertebral disc 06/01/2022   Iron deficiency anemia 05/10/2022   Lumbar spondylolysis 02/25/2022   Sleep disturbance 07/02/2021   Preventative health care 06/23/2018   Personal hx of gastric bypass 06/23/2018   Erectile dysfunction 06/23/2018   Hx of adenomatous colonic polyps    Essential hypertension 10/17/2013   Diverticulosis large intestine w/o perforation or abscess w/bleeding    Colon polyps     PCP: Dr. Tillman Abide  REFERRING PROVIDER: Dr. Doneen Poisson  REFERRING DIAG: [phone] (ICD-10-CM) - Status post total right knee replacement   THERAPY DIAG:  Muscle weakness (generalized)  Limited joint range of motion (ROM)  Difficulty in walking, not elsewhere classified  Abnormality of gait and mobility  Acute pain of right knee  Rationale for Evaluation and Treatment: Rehabilitation  ONSET DATE: 09/19/2023  SUBJECTIVE:   SUBJECTIVE STATEMENT: Patient reports knee is stiff this morning and hoping he didn't overdo it.  PERTINENT HISTORY:  Per ortho note from Dr. Magnus Ivan: Ilona Sorrel, 57 y.o. male, has a history of pain and functional disability in the right knee due to trauma and has failed non-surgical conservative treatments for greater than 12 weeks to includeNSAID's and/or analgesics, corticosteriod injections, use of assistive devices, and activity modification.  Onset of symptoms was abrupt, starting 1 years ago with gradually worsening course since that time. The patient noted prior procedures on the knee to include  ORIF  on the right knee(s).  Per previous admission chart review from Lesli Albee, PT, DPT: Relevant past medical  history and comorbidities include s/p hardware removal and arthroscopy of R knee on 06/22/2023 (hardware originally for distal medial condyle fracture);  s/p L5-S1 ANTERIOR LUMBAR FUSION on 03/06/2023; Diverticulosis large intestine w/o perforation or abscess w/bleeding; Essential hypertension; Rectal bleeding; Hx of adenomatous colonic polyps; Personal hx of gastric bypass; Erectile dysfunction; Sleep disturbance; Lumbar spondylolysis; Microcytic anemia; Iron deficiency anemia; Numbness and tingling; Imbalance; Difficulty sleeping; Disability of walking; Pain in joint involving ankle and foot; Degeneration of lumbar intervertebral disc; Erosive esophagitis; Mood disorder (HCC); Displaced supracondylar fracture of distal end of right femur without intracondylar extension (HCC); Anxiety; Vitamin D deficiency; Metabolic bone disease; and Radiculopathy, lumbar region. Please see above for relevant surgery history; osteoporosis, bowel/urinary incontinence, stumbling and dropping things. Patient denies hx of cancer, stroke, seizures, lung problems, heart problems, diabetes, and unexplained weight loss.   PAIN:  Are you having pain? Yes: NPRS scale: 7-8/10 Pain location: Right knee Pain description: ache- sharp Aggravating factors: bending, sitting, standing , walking Relieving factors: rest, meds  PRECAUTIONS: Knee  RED FLAGS: Bowel or bladder incontinence: Yes: bowel     WEIGHT BEARING RESTRICTIONS: No  FALLS:  Has patient fallen in last 6 months? 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  NEXT MD VISIT: Next month- Dr. Magnus Ivan  OBJECTIVE:   Note: Objective measures were completed at Evaluation unless otherwise noted.  DIAGNOSTIC FINDINGS:  Narrative & Impression  CLINICAL DATA:  Status post right knee replacement.   EXAM: PORTABLE RIGHT KNEE - 1-2 VIEW   COMPARISON:  06/22/2023   FINDINGS: Right knee arthroplasty in expected alignment. No periprosthetic lucency or fracture. There has been patellar resurfacing. Recent postsurgical change includes air and edema in the soft tissues and joint space. Chronic ghost tracks in the distal femur. Anterior skin staples in place.   IMPRESSION: Right knee arthroplasty without immediate postoperative complication.     Electronically Signed   By: Narda Rutherford M.D.   On: 09/19/2023 16:43    PATIENT SURVEYS:  FOTO 39 with goal of 54  COGNITION: Overall cognitive status: Within functional limits for tasks assessed     SENSATION: Light touch: Impaired - reported dead zone lateral joint line  EDEMA:  Circumferential:  1)bottom of incision=38; 2)10 cm above distal incision-=48, 3) top of incision= 49    POSTURE: No Significant postural limitations  PALPATION: Global tenderness along right knee- joint line and posterior knee  LOWER EXTREMITY ROM:  Active ROM Right eval Left eval  Hip flexion    Hip extension    Hip abduction    Hip adduction  Hip internal rotation    Hip external rotation    Knee flexion 90deg   Knee extension Lack 12 deg   Ankle dorsiflexion    Ankle plantarflexion    Ankle inversion    Ankle eversion     (Blank rows = not tested)  LOWER EXTREMITY MMT:  MMT Right eval Left eval  Hip flexion 4 5  Hip extension    Hip abduction 4 5  Hip adduction    Hip internal rotation    Hip external rotation    Knee flexion 3+ in available ROM 5  Knee extension 3+ in available ROM 5  Ankle dorsiflexion 4 5  Ankle plantarflexion    Ankle inversion    Ankle eversion     (Blank rows = not tested)  LOWER EXTREMITY SPECIAL TESTS:   N/a  FUNCTIONAL TESTS:  5 times sit to stand: Will assess visit #2 Timed up and go (TUG): Will assess visit #2 10 meter walk test: Will assess visit #2  GAIT: Distance walked: 75 feet Assistive device utilized: Single point cane Level of assistance: SBA Comments: antalgic on right- limited right knee flex and push off   TODAY'S TREATMENT:                                                                                                                              DATE: 10/16/23    Nustep (adjusted seat to work on increasing knee flex) From seat 11 to 9 (6 min total)  LE only  Instructed in standing knee ROM: -Flex- hold 30 sec x4 -Ext hold 30 sec x 3 (patient reported as more uncomfortable)   Seated knee AAROM- flex x 20  Quad sets with 5 sec hold with overpressure Therex ceased to patient experiencing abdominal cramping.   Manual therapy: Grade 2-3 PA/AP joint glides Tibiofemoral x 30 x 3 in progressive knee flex ROM. PROM to right knee flex x several min= measured at 97 deg AROM after today.    Verbal review of the following:  Ankle pumps (instructed to have leg elevated above heart- positioned with pillows)  Quad sets with 5 sec hold Heel slides  Hip abd Straight leg raise (SLR Knee flex stretch (Seated)   Knee ext stretch (seated)   PATIENT EDUCATION:  Education details: Purpose of PT, HEP, Pain management strategies including Ice, ROM, taking meds as prescribed.  Person educated: Patient Education method: Explanation, Demonstration, Tactile cues, Verbal cues, and Handouts Education comprehension: verbalized understanding, returned demonstration, verbal cues required, tactile cues required, and needs further education  HOME EXERCISE PROGRAM: Access Code: [facility] URL: [email or web address] Date: 10/11/2023 Prepared by: Maureen Ralphs  Exercises - Seated Hamstring Stretch  - 2 x daily - 3-5 sets - 30-45 hold - Seated Knee Flexion Stretch  -  2 x daily - 3-5 sets - 30-45 hold  ASSESSMENT:  CLINICAL IMPRESSION: Patient presents with good motivation for 2nd visit today- He was responsive to continuation  of learning knee ROM activities design to improve his flex/ext. He demo great progress already despite ongoing swelling right knee- measured at 97 deg. Treatment was limited/ceased due to patient experiencing abdominal cramping while performing therex. Patient will benefit from skilled PT services to improve his knee range of motion, LE strength, pain, and all functional mobility to enable him to return to independent walking and previous level of function.    OBJECTIVE IMPAIRMENTS: Abnormal gait, decreased activity tolerance, decreased balance, decreased endurance, decreased mobility, difficulty walking, decreased ROM, decreased strength, hypomobility, impaired flexibility, and pain.   ACTIVITY LIMITATIONS: carrying, lifting, bending, sitting, standing, squatting, sleeping, stairs, transfers, bed mobility, and toileting  PARTICIPATION LIMITATIONS: meal prep, cleaning, laundry, driving, shopping, community activity, occupation, and yard work  PERSONAL FACTORS: 1-2 comorbidities: chronic pain, HTN  are also affecting patient's functional outcome.   REHAB POTENTIAL: Good  CLINICAL DECISION MAKING: Stable/uncomplicated  EVALUATION COMPLEXITY: Low   GOALS: Goals reviewed with patient? Yes  SHORT TERM GOALS: Target date: 11/08/2023 Pt will be independent with HEP in order to decrease ankle pain and increase strength in order to improve pain-free function at home and work.  Baseline: EVAL- No formal HEP in place Goal status: INITIAL   LONG TERM GOALS: Target date: 12/06/2023  Pt will decrease worst pain as reported on NPRS by at least 3 points in order to demonstrate clinically significant reduction in ankle/foot pain.  Baseline: EVAL- R knee pain = 7-8/10 Goal status: INITIAL  2.  Pt will improve FOTO to target score of 54 to  display perceived improvements in ability to complete ADL's.  Baseline: EVAL= 39 Goal status: INITIAL  3.  Patient will improve Right knee AROM extension to <5 deg from zero for improved ability to straighten his leg with all mobility.  Baseline: EVAL= 12 deg Goal status: INITIAL  4.  Patient will improve Right knee AROM Flexion to > 120 deg for optimal right knee mobility with steps and sitting.  Baseline: EVAL= 90 deg Goal status: INITIAL      5.  Patient (> 42 years old) will complete five times sit to stand test in < 15 seconds indicating an increased LE strength and improved balance. Baseline: EVAL: to be assessed next visit Goal status: INITIAL     6.   Patient will reduce timed up and go to <11 seconds to reduce fall risk and demonstrate improved transfer/gait ability. Baseline: EVAL: To be assessed next visit Goal status: INITIAL  7.   Patient will increase 10 meter walk test to >1.15m/s as to improve gait speed for better community ambulation and to reduce fall risk. Baseline: EVAL- To be assessed next visit Goal status: INITIAL    PLAN:  PT FREQUENCY: 1-2x/week  PT DURATION: 8 weeks  PLANNED INTERVENTIONS: [id number]- PT Re-evaluation, 97110-Therapeutic exercises, 97530- Therapeutic activity, [id number]- Neuromuscular re-education, 97535- Self Care, [id number]- Manual therapy, [id number]- Gait training, [phone]- Orthotic Fit/training, [phone]- Electrical stimulation (manual), [phone]- Ultrasound, Patient/Family education, Balance training, Stair training, Taping, Dry Needling, Joint mobilization, Manual lymph drainage, Scar mobilization, Compression bandaging, Cryotherapy, and Moist heat  PLAN FOR NEXT SESSION: Continue with testing - 5xSTS, 10 MWT, TUG, Manual therapy for ROM, TE for strengthening.    Lenda Kelp, PT 10/16/2023, 9:15 AM

## 2023-10-16 NOTE — Assessment & Plan Note (Signed)
Doing better with the knee--but still basically a failed back surgery patient May be getting myelogram Dilaudid helps--from Dr Christell Constant Referral to pain management made

## 2023-10-16 NOTE — Assessment & Plan Note (Signed)
Depression mostly from chronic pain On the duloxetine 60mg 

## 2023-10-17 ENCOUNTER — Encounter (HOSPITAL_COMMUNITY): Payer: 59

## 2023-10-17 ENCOUNTER — Encounter: Payer: Self-pay | Admitting: Gastroenterology

## 2023-10-17 ENCOUNTER — Ambulatory Visit (INDEPENDENT_AMBULATORY_CARE_PROVIDER_SITE_OTHER): Payer: 59 | Admitting: Gastroenterology

## 2023-10-17 VITALS — BP 133/89 | HR 76 | Temp 97.6°F | Ht 70.0 in | Wt 178.0 lb

## 2023-10-17 DIAGNOSIS — D509 Iron deficiency anemia, unspecified: Secondary | ICD-10-CM | POA: Diagnosis not present

## 2023-10-17 DIAGNOSIS — K2210 Ulcer of esophagus without bleeding: Secondary | ICD-10-CM

## 2023-10-17 DIAGNOSIS — K529 Noninfective gastroenteritis and colitis, unspecified: Secondary | ICD-10-CM | POA: Diagnosis not present

## 2023-10-17 DIAGNOSIS — K649 Unspecified hemorrhoids: Secondary | ICD-10-CM

## 2023-10-17 DIAGNOSIS — Z860101 Personal history of adenomatous and serrated colon polyps: Secondary | ICD-10-CM | POA: Diagnosis not present

## 2023-10-17 DIAGNOSIS — K208 Other esophagitis without bleeding: Secondary | ICD-10-CM

## 2023-10-17 DIAGNOSIS — K8681 Exocrine pancreatic insufficiency: Secondary | ICD-10-CM

## 2023-10-17 DIAGNOSIS — Z7689 Persons encountering health services in other specified circumstances: Secondary | ICD-10-CM | POA: Diagnosis not present

## 2023-10-17 DIAGNOSIS — R933 Abnormal findings on diagnostic imaging of other parts of digestive tract: Secondary | ICD-10-CM | POA: Diagnosis not present

## 2023-10-17 MED ORDER — OMEPRAZOLE 40 MG PO CPDR: 40.0000 mg | DELAYED_RELEASE_CAPSULE | Freq: Two times a day (BID) | ORAL | 3 refills | Status: AC
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
## 2023-10-19 ENCOUNTER — Ambulatory Visit: Payer: 59

## 2023-10-23 ENCOUNTER — Other Ambulatory Visit: Payer: Self-pay | Admitting: Internal Medicine

## 2023-10-23 ENCOUNTER — Encounter: Payer: Self-pay | Admitting: Internal Medicine

## 2023-10-23 ENCOUNTER — Inpatient Hospital Stay: Payer: 59 | Attending: Oncology

## 2023-10-23 ENCOUNTER — Ambulatory Visit (INDEPENDENT_AMBULATORY_CARE_PROVIDER_SITE_OTHER): Payer: 59 | Admitting: Internal Medicine

## 2023-10-23 VITALS — BP 116/72 | HR 94 | Ht 70.0 in | Wt 193.6 lb

## 2023-10-23 DIAGNOSIS — E211 Secondary hyperparathyroidism, not elsewhere classified: Secondary | ICD-10-CM

## 2023-10-23 DIAGNOSIS — M810 Age-related osteoporosis without current pathological fracture: Secondary | ICD-10-CM

## 2023-10-23 DIAGNOSIS — Z7689 Persons encountering health services in other specified circumstances: Secondary | ICD-10-CM | POA: Diagnosis not present

## 2023-10-23 DIAGNOSIS — E559 Vitamin D deficiency, unspecified: Secondary | ICD-10-CM | POA: Diagnosis not present

## 2023-10-23 DIAGNOSIS — Z79899 Other long term (current) drug therapy: Secondary | ICD-10-CM | POA: Diagnosis not present

## 2023-10-23 DIAGNOSIS — D508 Other iron deficiency anemias: Secondary | ICD-10-CM

## 2023-10-23 DIAGNOSIS — D509 Iron deficiency anemia, unspecified: Secondary | ICD-10-CM | POA: Insufficient documentation

## 2023-10-23 LAB — CBC WITH DIFFERENTIAL (CANCER CENTER ONLY)
Abs Immature Granulocytes: 0.12 10*3/uL — ABNORMAL HIGH (ref 0.00–0.07)
Basophils Absolute: 0 10*3/uL (ref 0.0–0.1)
Basophils Relative: 0 %
Eosinophils Absolute: 0.1 10*3/uL (ref 0.0–0.5)
Eosinophils Relative: 1 %
HCT: 36.8 % — ABNORMAL LOW (ref 39.0–52.0)
Hemoglobin: 11.5 g/dL — ABNORMAL LOW (ref 13.0–17.0)
Immature Granulocytes: 1 %
Lymphocytes Relative: 16 %
Lymphs Abs: 1.4 10*3/uL (ref 0.7–4.0)
MCH: 31 pg (ref 26.0–34.0)
MCHC: 31.3 g/dL (ref 30.0–36.0)
MCV: 99.2 fL (ref 80.0–100.0)
Monocytes Absolute: 0.7 10*3/uL (ref 0.1–1.0)
Monocytes Relative: 8 %
Neutro Abs: 6.7 10*3/uL (ref 1.7–7.7)
Neutrophils Relative %: 74 %
Platelet Count: 255 10*3/uL (ref 150–400)
RBC: 3.71 MIL/uL — ABNORMAL LOW (ref 4.22–5.81)
RDW: 15.5 % (ref 11.5–15.5)
WBC Count: 9.1 10*3/uL (ref 4.0–10.5)
nRBC: 0 % (ref 0.0–0.2)

## 2023-10-23 LAB — COMPREHENSIVE METABOLIC PANEL
ALT: 38 U/L (ref 0–53)
AST: 37 U/L (ref 0–37)
Albumin: 3.7 g/dL (ref 3.5–5.2)
Alkaline Phosphatase: 84 U/L (ref 39–117)
BUN: 12 mg/dL (ref 6–23)
CO2: 28 meq/L (ref 19–32)
Calcium: 8.5 mg/dL (ref 8.4–10.5)
Chloride: 109 meq/L (ref 96–112)
Creatinine, Ser: 0.94 mg/dL (ref 0.40–1.50)
GFR: 88.75 mL/min (ref >60.00)
Glucose, Bld: 100 mg/dL — ABNORMAL HIGH (ref 70–99)
Potassium: 4.5 meq/L (ref 3.5–5.1)
Sodium: 141 meq/L (ref 135–145)
Total Bilirubin: 0.4 mg/dL (ref 0.2–1.2)
Total Protein: 6.6 g/dL (ref 6.0–8.3)

## 2023-10-23 LAB — T4, FREE: Free T4: 0.62 ng/dL (ref 0.60–1.60)

## 2023-10-23 LAB — IRON AND TIBC
Iron: 52 ug/dL (ref 45–182)
Saturation Ratios: 13 % — ABNORMAL LOW (ref 17.9–39.5)
TIBC: 403 ug/dL (ref 250–450)
UIBC: 351 ug/dL

## 2023-10-23 LAB — RETIC PANEL
Immature Retic Fract: 6.4 % (ref 2.3–15.9)
RBC.: 3.64 MIL/uL — ABNORMAL LOW (ref 4.22–5.81)
Retic Count, Absolute: 40.4 10*3/uL (ref 19.0–186.0)
Retic Ct Pct: 1.1 % (ref 0.4–3.1)
Reticulocyte Hemoglobin: 33.7 pg (ref >27.9)

## 2023-10-23 LAB — FERRITIN: Ferritin: 49 ng/mL (ref 24–336)

## 2023-10-23 LAB — PHOSPHORUS: Phosphorus: 3.6 mg/dL (ref 2.3–4.6)

## 2023-10-23 LAB — VITAMIN D 25 HYDROXY (VIT D DEFICIENCY, FRACTURES): VITD: 12.11 ng/mL — ABNORMAL LOW (ref 30.00–100.00)

## 2023-10-23 LAB — TSH: TSH: 1.38 u[IU]/mL (ref 0.35–5.50)

## 2023-10-23 MED ORDER — ALENDRONATE SODIUM 70 MG PO TABS
70.0000 mg | ORAL_TABLET | ORAL | 3 refills | Status: DC
Start: 2023-10-23 — End: 2024-04-22

## 2023-10-23 MED ORDER — CETIRIZINE HCL 10 MG PO TABS: 10.0000 mg | ORAL_TABLET | Freq: Every day | ORAL | 3 refills | Status: DC

## 2023-10-23 NOTE — Patient Instructions (Signed)
Restart Alendronate (Fosamax) 70 mg once weekly Please make sure you take calcium CITRATE 1200-1500 mg a day  Take TWO Multivitamins a day  Continue Vitamin D3 at 5000 units daily

## 2023-10-23 NOTE — Progress Notes (Unsigned)
Name: Harold Smith  MRN/ DOB: [id number], October 05, 1966    Age/ Sex: 57 y.o., male    PCP: Karie Schwalbe, MD   Reason for Endocrinology Evaluation: Osteoporosis     Date of Initial Endocrinology Evaluation: 10/23/2023     HPI: [name] is a 57 y.o. male with a past medical history of HTN, iron deficiency anemia and chronic pain syndrome, Hx Roux-en-Y in 2009. The patient presented for initial endocrinology clinic visit on 10/23/2023 for consultative assistance with his osteoporosis.   Pt was diagnosed with osteoporosis:10/2022 with a T-score -3.1 at the right femoral neck  Fracture Hx: L1 compression fracture,right femur ( fell down the steps )  FH of osteoporosis or hip fracture:  n/a Prior Hx of anti-resorptive therapy : Alendronate started 12/2022, but has been out for months   Of note, the patient has a history of Roux-en-Y in 2015  Denies nausea or vomiting  Has Acid reflux  Denies constipation or diarrhea  Denies palpitations  Denies local neck swelling     Recent right knee replacement  Calcium daily  Vitamin D3 5000 units daily  MVI  Glucocorticoids- no  Alendronate 70 mg weekly  No tobacco  Occasional ETOH    HISTORY:  Past Medical History:  Past Medical History:  Diagnosis Date   Allergic rhinitis due to pollen    Anemia    Anxiety    Chronic pain    Colon polyps    Depression    Diverticulitis large intestine    Erosive esophagitis    GERD (gastroesophageal reflux disease)    HTN (hypertension)    Irritable bowel syndrome    Metabolic bone disease 09/01/2022   Obesity    Osteoarthritis, knee    Sleep apnea    does not use cpap, pt states he no longer needed CPAP due to weight loss   Sleep disturbance    Vitamin D deficiency 09/01/2022   Past Surgical History:  Past Surgical History:  Procedure Laterality Date   ABDOMINAL EXPOSURE N/A 03/06/2023   Procedure: ABDOMINAL EXPOSURE;  Surgeon: Cephus Shelling, MD;   Location: [facility] OR;  Service: Vascular;  Laterality: N/A;   ACHILLES TENDON REPAIR Right 12/26/2006   ANTERIOR LUMBAR FUSION N/A 03/06/2023   Procedure: L5-S1 ANTERIOR LUMBAR FUSION 1 LEVEL;  Surgeon: London Sheer, MD;  [facility]: [facility] OR;  Service: Orthopedics;  Laterality: N/A;   BIOPSY  08/29/2023   Procedure: BIOPSY;  Surgeon: Toney Reil, MD;  Location: [facility] ENDOSCOPY;  Service: Gastroenterology;;   CATARACT EXTRACTION W/PHACO Left 07/20/2021   Procedure: CATARACT EXTRACTION PHACO AND INTRAOCULAR LENS PLACEMENT (IOC) LEFT 2.11 00:24.0;  Surgeon: Galen Manila, MD;  Location: [facility] SURGERY CNTR;  Service: Ophthalmology;  Laterality: Left;  sleep apnea   CATARACT EXTRACTION W/PHACO Right 08/03/2021   Procedure: CATARACT EXTRACTION PHACO AND INTRAOCULAR LENS PLACEMENT (IOC) RIGHT;  Surgeon: Galen Manila, MD;  Location: [facility] SURGERY CNTR;  Service: Ophthalmology;  Laterality: Right;  3.19 0:33.0   CHOLECYSTECTOMY     COLON RESECTION Left 12/26/2004   due to diverticular disease at [facility]   COLONOSCOPY  2013?   COLONOSCOPY N/A 11/30/2022   Procedure: COLONOSCOPY;  Surgeon: Toney Reil, MD;  Location: [facility] ENDOSCOPY;  Service: Gastroenterology;  Laterality: N/A;   COLONOSCOPY WITH PROPOFOL N/A 05/22/2018   Procedure: COLONOSCOPY WITH PROPOFOL;  Surgeon: Toney Reil, MD;  Location: [facility] ENDOSCOPY;  Service: Gastroenterology;  Laterality: N/A;   COLONOSCOPY WITH PROPOFOL N/A 10/22/2020  Procedure: COLONOSCOPY WITH PROPOFOL;  Surgeon: Toney Reil, MD;  Location: [facility] SURGERY CNTR;  Service: Endoscopy;  Laterality: N/A;  priority 4   COLONOSCOPY WITH PROPOFOL N/A 11/10/2021   Procedure: COLONOSCOPY WITH PROPOFOL;  Surgeon: Toney Reil, MD;  Location: [facility] ENDOSCOPY;  Service: Gastroenterology;  Laterality: N/A;   COLONOSCOPY WITH PROPOFOL N/A 06/16/2022   Procedure: COLONOSCOPY WITH PROPOFOL;  Surgeon: Toney Reil, MD;  Location: [facility]  SURGERY CNTR;  Service: Endoscopy;  Laterality: N/A;   COLONOSCOPY WITH PROPOFOL N/A 11/29/2022   Procedure: COLONOSCOPY WITH PROPOFOL;  Surgeon: Toney Reil, MD;  Location: [facility] ENDOSCOPY;  Service: Gastroenterology;  Laterality: N/A;   DRUG INDUCED ENDOSCOPY N/A 06/10/2019   Procedure: DRUG INDUCED SLEEP ENDOSCOPY;  Surgeon: Osborn Coho, MD;  Location: [facility];  Service: ENT;  Laterality: N/A;   ESOPHAGOGASTRODUODENOSCOPY (EGD) WITH PROPOFOL N/A 06/16/2022   Procedure: ESOPHAGOGASTRODUODENOSCOPY (EGD) WITH PROPOFOL;  Surgeon: Toney Reil, MD;  Location: [facility] SUR[facility]ERY [facility];  Service: Endoscopy;  Laterality: N/A;   ESOPHAGOGASTRODUODENOSCOPY (EGD) WITH PROPOFOL N/A 05/24/2023   Procedure: ESOPHAGOGASTRODUODENOSCOPY (EGD) WITH PROPOFOL;  Surgeon: Toney Reil, MD;  Location: [facility] ENDOSCOPY;  Service: Gastroenterology;  Laterality: N/A;   ESOPHAGOGASTRODUODENOSCOPY (EGD) WITH PROPOFOL N/A 08/29/2023   Procedure: ESOPHAGOGASTRODUODENOSCOPY (EGD) WITH PROPOFOL;  Surgeon: Toney Reil, MD;  Location: [facility] ENDOSCOPY;  Service: Gastroenterology;  Laterality: N/A;   GASTRIC BYPASS N/A 12/27/2007   HARDWARE REMOVAL Right 06/22/2023   Procedure: HARDWARE REMOVAL;  Surgeon: Myrene Galas, MD;  Location: [facility] OR;  Service: Orthopedics;  Laterality: Right;   HIATAL HERNIA REPAIR  09/25/2014   at [facility]   KNEE ARTHROSCOPY Right 06/22/2023   Procedure: ARTHROSCOPY KNEE;  Surgeon: Myrene Galas, MD;  Location: [facility] OR;  Service: Orthopedics;  Laterality: Right;   ORIF FEMUR FRACTURE Right 08/30/2022   Procedure: OPEN REDUCTION INTERNAL FIXATION (ORIF) DISTAL FEMUR FRACTURE;  Surgeon: Myrene Galas, MD;  Location[facility] OR;  Service: Orthopedics;  Laterality: Right;   POLYPECTOMY  10/22/2020   Procedure: POLYPECTOMY;  Surgeon: Toney Reil, MD;  Location: [facility] SURGERY [facility]TR;  Service: Endoscopy;;   POLYPECTOMY  06/16/2022   Procedure: POLYPECTOMY;   Surgeon: Toney Reil, MD;  Location: [facility] SURGERY CNTR;  Service: Endoscopy;;   ROTATOR CUFF REPAIR Right    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   TOTAL KNEE ARTHROPLASTY Right 09/19/2023   Procedure: RIGHT TOTAL KNEE ARTHROPLASTY;  Surgeon: Kathryne Hitch, MD;  Location: [facility] OR;  Service: Orthopedics;  Laterality: Right;    Social History:  reports that he has been smoking cigars. He has been exposed to tobacco smoke. He has never used smokeless tobacco. He reports current alcohol use. He reports that he does not use drugs. Family History: family history includes Asthma in his sister; Cancer in his paternal uncle; Colon cancer (age of onset: 19) in his father; Diabetes in his mother and another family member; Heart disease in his mother and another family member; Hypertension in his mother and another family member; Kidney disease in his mother and another family member; Lung cancer in his paternal grandmother; Obesity in his brother and sister; Stroke in his sister.   HOME MEDICATIONS: Allergies as of 10/23/2023   No Known Allergies      Medication List        Accurate as of October 23, 2023  8:34 AM. If you have any questions, ask your nurse or doctor.  alendronate 70 MG tablet Commonly known as: Fosamax Take 1 tablet (70 mg total) by mouth once a week. Take with a full glass of water on an empty stomach.   aspirin 81 MG chewable tablet Chew 1 tablet (81 mg total) by mouth 2 (two) times daily.   baclofen 10 MG tablet Commonly known as: LIORESAL Take 1 tablet (10 mg total) by mouth 3 (three) times daily.   CALCIUM PO Take 1 tablet by mouth daily.   carboxymethylcellulose 0.5 % Soln Commonly known as: REFRESH PLUS Place 1 drop into both eyes daily as needed (dry eyes).   cetirizine 10 MG tablet Commonly known as: ZYRTEC Take 10 mg by mouth daily.   Cholecalciferol 125 MCG (5000 UT) Tabs Take 1 tablet by mouth daily. What  changed: how much to take   cyclobenzaprine 5 MG tablet Commonly known as: FLEXERIL Take 1-2 tablets (5-10 mg total) by mouth 3 (three) times daily as needed for muscle spasms.   dicyclomine 10 MG capsule Commonly known as: BENTYL Take 1 to 2 capsules as needed for abdominal cramps and diarrhea every 6 to 8 hours   doxycycline 100 MG tablet Commonly known as: VIBRA-TABS Take 1 tablet (100 mg total) by mouth 2 (two) times daily.   DULoxetine 60 MG capsule Commonly known as: CYMBALTA TAKE 1 CAPSULE BY MOUTH ONCE DAILY   FeroSul 325 (65 Fe) MG tablet Generic drug: ferrous sulfate TAKE ONE TABLET EACH MORNING WITH BREAKFAST   fluticasone 50 MCG/ACT nasal spray Commonly known as: FLONASE Place 2 sprays into both nostrils daily. What changed:  when to take this reasons to take this   gabapentin 300 MG capsule Commonly known as: NEURONTIN TAKE 1 CAPSULE BY MOUTH TWICE DAILY AND TAKE 4 CAPSULES AT BEDTIME What changed:  how much to take how to take this when to take this reasons to take this additional instructions   hydrochlorothiazide 25 MG tablet Commonly known as: HYDRODIURIL TAKE 1 TABLET BY MOUTH DAILY   hydrocortisone 25 MG suppository Commonly known as: ANUSOL-HC Place 1 suppository (25 mg total) rectally daily.   lipase/protease/amylase 40981 UNITS Cpep capsule Commonly known as: Creon Take 2 capsules (72,000 Units total) by mouth 3 (three) times daily with meals. May also take 1 capsule (36,000 Units total) as needed (with snacks). What changed: See the new instructions.   lisinopril 20 MG tablet Commonly known as: ZESTRIL TAKE 1 TABLET BY MOUTH DAILY   meloxicam 15 MG tablet Commonly known as: MOBIC TAKE 1 TABLET BY MOUTH DAILY AS NEEDED FOR PAIN   multivitamin tablet Take 1 tablet by mouth daily.   naloxone 4 MG/0.1ML Liqd nasal spray kit Commonly known as: NARCAN Place 1 spray into the nose once.   omeprazole 40 MG capsule Commonly known as:  PRILOSEC Take 1 capsule (40 mg total) by mouth 2 (two) times daily before a meal.   oxyCODONE-acetaminophen 5-325 MG tablet Commonly known as: PERCOCET/ROXICET Take 1-2 tablets by mouth every 6 (six) hours as needed.   PREVIDENT 5000 BOOSTER PLUS DT Place 1 application  onto teeth 2 (two) times daily.   QUEtiapine 25 MG tablet Commonly known as: SEROQUEL TAKE 1-2 TABLETS BY MOUTH AT BEDTIME AS NEEDED FOR SLEEP   tadalafil 20 MG tablet Commonly known as: Cialis Take 1 tablet (20 mg total) by mouth daily as needed for erectile dysfunction.   traZODone 50 MG tablet Commonly known as: DESYREL Take 1-2 tablets (50-100 mg total) by mouth at bedtime as needed for  sleep.   VITAMIN B-12 PO Take 1 tablet by mouth daily.          REVIEW OF SYSTEMS: A comprehensive ROS was conducted with the patient and is negative except as per HPI    OBJECTIVE:  VS: BP 116/72 (BP Location: Left Arm, Patient Position: Sitting, Cuff Size: Large)   Pulse 94   Ht 5\' 10"  (1.778 m)   Wt 193 lb 9.6 oz (87.8 kg)   SpO2 96%   BMI 27.78 kg/m    Wt Readings from Last 3 Encounters:  10/23/23 193 lb 9.6 oz (87.8 kg)  10/17/23 178 lb (80.7 kg)  10/16/23 180 lb (81.6 kg)     EXAM: General: Pt appears well and is in NAD  Neck: General: Supple without adenopathy. Thyroid: Thyroid size normal.  No goiter or nodules appreciated.  Lungs: Clear with good BS bilat   Heart: Auscultation: RRR.  Abdomen: Soft, nontender  Extremities:  BL LE: Trace  pretibial edema on right , no edema on the left   Mental Status: Judgment, insight: Intact Orientation: Oriented to time, place, and person Mood and affect: No depression, anxiety, or agitation     DATA REVIEWED: ***   DXA 11/23/2022  AP Spine L2-L4 11/23/2022 [age] Osteoporosis -2.7 0.927 g/cm2 - -   DualFemur Neck Right 11/23/2022 [age] Osteoporosis -3.1 0.670 g/cm2 - - ASSESSMENT:   The BMD measured at Femur Neck Right is 0.670 g/cm2 with a  T-score of -3.1. This patient is considered Osteopenic according to the [facility] Health [facility]) criteria. The scan quality is good. L1 was excluded due to previous surgery.   Old records , labs and images have been reviewed.    ASSESSMENT/PLAN/RECOMMENDATIONS:   Osteoporosis:  -Patient with history of Roux-en-Y in 2009 which is a contributing factor into her osteoporosis -Emphasized the importance of optimizing calcium and vitamin D intake -I have asked the patient to switch to calcium citrate -Patient to increase multivitamin as below -Limited on exercise due to recent right knee replacement -Despite heartburn issues, he was able to tolerate alendronate, a year supply was sent to his pharmacy today -     Medications : Calcium citrate 1200-1500 mg daily Vitamin D3 5000 IU daily 2 multivitamins daily  Follow-up in 6 months  Signed electronically by: Lyndle Herrlich, MD  [facility]  [facility] [address] [place] [place], [place] [address] Phone: [phone] FAX: [phone]   CC: Karie Schwalbe, MD [address] [place] [address] Phone: [phone] Fax: [phone]   Return to Endocrinology clinic as below: Future Appointments  Date Time Provider Department Center  10/23/2023  9:45 AM [facility] None  10/25/2023  8:45 AM Lenda Kelp, PT [facility] None  10/25/2023  1:00 PM Rickard Patience, MD [facility] None  10/25/2023  1:30 PM [facility] INFUSION CHAIR 1 [facility] None  10/30/2023  8:45 AM Kathryne Hitch, MD [facility] None  11/02/2023  9:30 AM Golden Pop, PT [facility] None  11/07/2023  8:00 AM Lenda Kelp, PT [facility] None  11/09/2023  8:00 AM Golden Pop, PT [facility] None  11/14/2023 10:15 AM Lenda Kelp, PT [facility] None  11/16/2023  8:00 AM Golden Pop, PT [facility] None  11/21/2023 11:00 AM Lenda Kelp, PT [facility] None   11/28/2023  8:00 AM Golden Pop, PT [facility] None  11/30/2023  8:00 AM Golden Pop, PT [facility] None  12/05/2023  8:00 AM  Grier Rocher E, PT [facility] None  12/07/2023  8:00 AM Golden Pop, PT [facility] None  12/12/2023  8:45 AM Helane Rima, Merry Lofty, PT [facility] None  12/14/2023  9:30 AM Lenda Kelp, PT [facility] None  12/19/2023  8:45 AM Helane Rima, Merry Lofty, PT [facility] None  12/21/2023  9:30 AM Helane Rima, Merry Lofty, PT [facility] None  12/26/2023  8:45 AM Helane Rima, Merry Lofty, PT [facility] None  12/28/2023  9:30 AM Helane Rima, Merry Lofty, PT [facility] None  01/02/2024  8:45 AM [place], Merry Lofty, PT [facility] None  01/04/2024  8:45 AM Helane Rima, Merry Lofty, PT [facility] None  01/09/2024  8:45 AM Helane Rima, Merry Lofty, PT [facility] None  01/11/2024  8:45 AM Helane Rima, Merry Lofty, PT [facility] None  01/16/2024  8:45 AM Helane Rima, Merry Lofty, PT [facility] None  01/18/2024  8:45 AM Helane Rima, Merry Lofty, PT [facility] None  01/23/2024  8:45 AM Helane Rima, Merry Lofty, PT [facility] None  01/25/2024  8:45 AM Helane Rima, Merry Lofty, PT [facility] None  01/30/2024  8:45 AM Helane Rima, Merry Lofty, PT [facility] None  02/01/2024  8:45 AM Helane Rima, Merry Lofty, PT [facility] None  02/06/2024  8:45 AM Helane Rima, Merry Lofty, PT [facility] None  02/08/2024  8:45 AM Helane Rima, Merry Lofty, PT [facility] None  02/13/2024  8:45 AM Helane Rima, Merry Lofty, PT [facility] None  02/15/2024  8:45 AM Lenda Kelp, PT [facility] None  07/15/2024  9:00 AM Karie Schwalbe, MD [facility]

## 2023-10-24 LAB — PARATHYROID HORMONE, INTACT (NO CA): PTH: 90 pg/mL — ABNORMAL HIGH (ref 16–77)

## 2023-10-25 ENCOUNTER — Encounter: Payer: Self-pay | Admitting: Orthopaedic Surgery

## 2023-10-25 ENCOUNTER — Ambulatory Visit: Payer: 59

## 2023-10-25 ENCOUNTER — Inpatient Hospital Stay (HOSPITAL_BASED_OUTPATIENT_CLINIC_OR_DEPARTMENT_OTHER): Payer: 59 | Admitting: Oncology

## 2023-10-25 ENCOUNTER — Inpatient Hospital Stay: Payer: 59

## 2023-10-25 ENCOUNTER — Encounter: Payer: Self-pay | Admitting: Internal Medicine

## 2023-10-25 ENCOUNTER — Encounter: Payer: Self-pay | Admitting: Oncology

## 2023-10-25 VITALS — BP 154/89 | HR 89 | Resp 18

## 2023-10-25 VITALS — BP 110/67 | HR 103 | Temp 98.2°F | Resp 18 | Wt 192.4 lb

## 2023-10-25 DIAGNOSIS — R262 Difficulty in walking, not elsewhere classified: Secondary | ICD-10-CM | POA: Diagnosis not present

## 2023-10-25 DIAGNOSIS — M6281 Muscle weakness (generalized): Secondary | ICD-10-CM | POA: Diagnosis not present

## 2023-10-25 DIAGNOSIS — D508 Other iron deficiency anemias: Secondary | ICD-10-CM

## 2023-10-25 DIAGNOSIS — M25561 Pain in right knee: Secondary | ICD-10-CM | POA: Diagnosis not present

## 2023-10-25 DIAGNOSIS — M2560 Stiffness of unspecified joint, not elsewhere classified: Secondary | ICD-10-CM | POA: Diagnosis not present

## 2023-10-25 DIAGNOSIS — Z96651 Presence of right artificial knee joint: Secondary | ICD-10-CM | POA: Diagnosis not present

## 2023-10-25 DIAGNOSIS — D509 Iron deficiency anemia, unspecified: Secondary | ICD-10-CM | POA: Diagnosis not present

## 2023-10-25 DIAGNOSIS — R269 Unspecified abnormalities of gait and mobility: Secondary | ICD-10-CM

## 2023-10-25 DIAGNOSIS — E211 Secondary hyperparathyroidism, not elsewhere classified: Secondary | ICD-10-CM | POA: Insufficient documentation

## 2023-10-25 DIAGNOSIS — Z79899 Other long term (current) drug therapy: Secondary | ICD-10-CM | POA: Diagnosis not present

## 2023-10-25 DIAGNOSIS — Z7689 Persons encountering health services in other specified circumstances: Secondary | ICD-10-CM | POA: Diagnosis not present

## 2023-10-25 MED ORDER — CALCITRIOL 0.25 MCG PO CAPS: 0.2500 ug | ORAL_CAPSULE | Freq: Every day | ORAL | 2 refills | Status: DC

## 2023-10-25 MED ORDER — IRON SUCROSE 20 MG/ML IV SOLN
200.0000 mg | Freq: Once | INTRAVENOUS | Status: AC
  Administered 2023-10-25: 200 mg via INTRAVENOUS
  Filled 2023-10-25: qty 10

## 2023-10-25 MED ORDER — VITAMIN D (ERGOCALCIFEROL) 1.25 MG (50000 UNIT) PO CAPS: 50000.0000 [IU] | ORAL_CAPSULE | ORAL | 2 refills | Status: DC

## 2023-10-25 NOTE — Therapy (Signed)
OUTPATIENT PHYSICAL THERAPY LOWER EXTREMITY TREATMENT   Patient Name: Harold Smith MRN: [id number] DOB:June 13, 1966, 57 y.o., male Today's Date: 10/26/2023  END OF SESSION:  PT End of Session - 10/25/23 0835     Visit Number 3    Number of Visits 14    Date for PT Re-Evaluation 12/06/23    Authorization Type [employer] 2024    Authorization - Number of Visits 14    Progress Note Due on Visit 10    PT Start Time 0846    PT Stop Time 0929    PT Time Calculation (min) 43 min    Equipment Utilized During Treatment Gait belt    Activity Tolerance Patient tolerated treatment well;No increased pain    Behavior During Therapy WFL for tasks assessed/performed              Past Medical History:  Diagnosis Date   Allergic rhinitis due to pollen    Anemia    Anxiety    Chronic pain    Colon polyps    Depression    Diverticulitis large intestine    Erosive esophagitis    GERD (gastroesophageal reflux disease)    HTN (hypertension)    Irritable bowel syndrome    Metabolic bone disease 09/01/2022   Obesity    Osteoarthritis, knee    Sleep apnea    does not use cpap, pt states he no longer needed CPAP due to weight loss   Sleep disturbance    Vitamin D deficiency 09/01/2022   Past Surgical History:  Procedure Laterality Date   ABDOMINAL EXPOSURE N/A 03/06/2023   Procedure: ABDOMINAL EXPOSURE;  Surgeon: Cephus Shelling, MD;  Location: [facility] OR;  Service: Vascular;  Laterality: N/A;   ACHILLES TENDON REPAIR Right 12/26/2006   ANTERIOR LUMBAR FUSION N/A 03/06/2023   Procedure: L5-S1 ANTERIOR LUMBAR FUSION 1 LEVEL;  Surgeon: London Sheer, MD;  Location: [facility] OR;  Service: Orthopedics;  Laterality: N/A;   BIOPSY  08/29/2023   Procedure: BIOPSY;  Surgeon: Toney Reil, MD;  Location: [facility] ENDOSCOPY;  Service: Gastroenterology;;   CATARACT EXTRACTION W/PHACO Left 07/20/2021   Procedure: CATARACT EXTRACTION PHACO AND INTRAOCULAR LENS PLACEMENT (IOC)  LEFT 2.11 00:24.0;  Surgeon: Galen Manila, MD;  Location: [facility]GERY [facility];  Service: Ophthalmology;  Laterality: Left;  sleep apnea   CATARACT EXTRACTION W/PHACO Right 08/03/2021   Procedure: CATARACT EXTRACTION PHACO AND INTRAOCULAR LENS PLACEMENT (IOC) RIGHT;  Surgeon: Galen Manila, MD;  Location: [facility];  Service: Ophthalmology;  Laterality: Right;  3.19 0:33.0   CHOLECYSTECTOMY     COLON RESECTION Left 12/26/2004   due to diverticular disease at [facility]   COLONOSCOPY  2013?   COLONOSCOPY N/A 11/30/2022   Procedure: COLONOSCOPY;  Surgeon: Toney Reil, MD;  Location: [facility] ENDOSCOPY;  Service: Gastroenterology;  Laterality: N/A;   COLONOSCOPY WITH PROPOFOL N/A 05/22/2018   Procedure: COLONOSCOPY WITH PROPOFOL;  Surgeon: Toney Reil, MD;  Location: [facility] ENDOSCOPY;  Service: Gastroenterology;  Laterality: N/A;   COLONOSCOPY WITH PROPOFOL N/A 10/22/2020   Procedure: COLONOSCOPY WITH PROPOFOL;  Surgeon: Toney Reil, MD;  Location: [facility] SURGERY [facility]TR;  Service: Endoscopy;  Laterality: N/A;  priority 4   COLONOSCOPY WITH PROPOFOL N/A 11/10/2021   Procedure: COLONOSCOPY WITH PROPOFOL;  Surgeon: Toney Reil, MD;  Location: [facility] END[facility]COPY;  Service: Gastroenterology;  Laterality: N/A;   COLONOSCOPY WITH PROPOFOL N/A 06/16/2022   Procedure: COLONOSCOPY WITH PROPOFOL;  Surgeon: Toney Reil, MD;  Location: [facility] SUR[facility]  [facility];  Service: Endoscopy;  Laterality: N/A;   COLONOSCOPY WITH PROPOFOL N/A 11/29/2022   Procedure: COLONOSCOPY WITH PROPOFOL;  Surgeon: Toney Reil, MD;  Location: [facility] ENDOSCOPY;  Service: Gastroenterology;  Laterality: N/A;   DRUG INDUCED ENDOSCOPY N/A 06/10/2019   Procedure: DRUG INDUCED SLEEP ENDOSCOPY;  Surgeon: Osborn Coho, MD;  Location: [facility];  Service: ENT;  Laterality: N/A;   ESOPHAGOGASTRODUODENOSCOPY (EGD) WITH PROPOFOL N/A 06/16/2022   Procedure: ESOPHAGOGASTRODUODENOSCOPY  (EGD) WITH PROPOFOL;  Surgeon: Toney Reil, MD;  Location: [facility] SURGERY [facility]TR;  Service: Endoscopy;  Laterality: N/A;   ESOPHAGOGASTRODUODENOSCOPY (EGD) WITH PROPOFOL N/A 05/24/2023   Procedure: ESOPHAGOGASTRODUODENOSCOPY (EGD) WITH PROPOFOL;  Surgeon: Toney Reil, MD;  Location: [facility] ENDOSCOPY;  Service: Gastroenterology;  Laterality: N/A;   ESOPHAGOGASTRODUODENOSCOPY (EGD) WITH PROPOFOL N/A 08/29/2023   Procedure: ESOPHAGOGASTRODUODENOSCOPY (EGD) WITH PROPOFOL;  Surgeon: Toney Reil, MD;  Location: [facility] ENDOSCOPY;  Service: Gastroenterology;  Laterality: N/A;   GASTRIC BYPASS N/A 12/27/2007   HARDWARE REMOVAL Right 06/22/2023   Procedure: HARDWARE REMOVAL;  Surgeon: Myrene Galas, MD;  Location: [facility] OR;  Service: Orthopedics;  Laterality: Right;   HIATAL HERNIA REPAIR  09/25/2014   at [facility]   KNEE ARTHROSCOPY Right 06/22/2023   Procedure: ARTHROSCOPY KNEE;  Surgeon: Myrene Galas, MD;  Location: [facility] OR;  Service: Orthopedics;  Laterality: Right;   ORIF FEMUR FRACTURE Right 08/30/2022   Procedure: OPEN REDUCTION INTERNAL FIXATION (ORIF) DISTAL FEMUR FRACTURE;  Surgeon: Myrene Galas, MD;  Location: [facility] OR;  Service: Orthopedics;  Laterality: Right;   POLYPECTOMY  10/22/2020   Procedure: POLYPECTOMY;  Surgeon: Toney Reil, MD;  Location: [facility] SURGERY CNTR;  Service: Endoscopy;;   POLYPECTOMY  06/16/2022   Procedure: POLYPECTOMY;  Surgeon: Toney Reil, MD;  Location: [facility] SURGERY CNTR;  Service: Endoscopy;;   ROTATOR CUFF REPAIR Right    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   TOTAL KNEE ARTHROPLASTY Right 09/19/2023   Procedure: RIGHT TOTAL KNEE ARTHROPLASTY;  Surgeon: Kathryne Hitch, MD;  Location: [facility] OR;  Service: Orthopedics;  Laterality: Right;   Patient Active Problem List   Diagnosis Date Noted   Secondary hyperparathyroidism, non-renal (HCC) 10/25/2023   Age-related osteoporosis without current pathological fracture  10/23/2023   S/P total knee arthroplasty, right 09/19/2023   Status post total right knee replacement 09/19/2023   Chronic pain syndrome 07/12/2023   Anxiety 09/01/2022   Vitamin D deficiency 09/01/2022   Metabolic bone disease  09/01/2022   Displaced supracondylar fracture of distal end of right femur without intracondylar extension (HCC) 08/29/2022   Mood disorder (HCC) 07/08/2022   Disability of walking 06/01/2022   Degeneration of lumbar intervertebral disc 06/01/2022   Iron deficiency anemia 05/10/2022   Lumbar spondylolysis 02/25/2022   Sleep disturbance 07/02/2021   Preventative health care 06/23/2018   Personal hx of gastric bypass 06/23/2018   Erectile dysfunction 06/23/2018   Hx of adenomatous colonic polyps    Essential hypertension 10/17/2013   Diverticulosis large intestine w/o perforation or abscess w/bleeding    Colon polyps     PCP: Dr. Tillman Abide  REFERRING PROVIDER: Dr. Doneen Poisson  REFERRING DIAG: [phone] (ICD-10-CM) - Status post total right knee replacement   THERAPY DIAG:  Muscle weakness (generalized)  Limited joint range of motion (ROM)  Difficulty in walking, not elsewhere classified  Abnormality of gait and mobility  Acute pain of right knee  Rationale for Evaluation and Treatment: Rehabilitation  ONSET DATE: 09/19/2023  SUBJECTIVE:   SUBJECTIVE  STATEMENT: Patient reports compliant with HEP but still dealing with some swelling. Reports steri-strips have now all come off.    PERTINENT HISTORY:  Per ortho note from Dr. Magnus Ivan: Ilona Sorrel, 57 y.o. male, has a history of pain and functional disability in the right knee due to trauma and has failed non-surgical conservative treatments for greater than 12 weeks to includeNSAID's and/or analgesics, corticosteriod injections, use of assistive devices, and activity modification.  Onset of symptoms was abrupt, starting 1 years ago with gradually worsening course since that  time. The patient noted prior procedures on the knee to include  ORIF  on the right knee(s).  Per previous admission chart review from Lesli Albee, PT, DPT: Relevant past medical history and comorbidities include s/p hardware removal and arthroscopy of R knee on 06/22/2023 (hardware originally for distal medial condyle fracture);  s/p L5-S1 ANTERIOR LUMBAR FUSION on 03/06/2023; Diverticulosis large intestine w/o perforation or abscess w/bleeding; Essential hypertension; Rectal bleeding; Hx of adenomatous colonic polyps; Personal hx of gastric bypass; Erectile dysfunction; Sleep disturbance; Lumbar spondylolysis; Microcytic anemia; Iron deficiency anemia; Numbness and tingling; Imbalance; Difficulty sleeping; Disability of walking; Pain in joint involving ankle and foot; Degeneration of lumbar intervertebral disc; Erosive esophagitis; Mood disorder (HCC); Displaced supracondylar fracture of distal end of right femur without intracondylar extension (HCC); Anxiety; Vitamin D deficiency; Metabolic bone disease; and Radiculopathy, lumbar region. Please see above for relevant surgery history; osteoporosis, bowel/urinary incontinence, stumbling and dropping things. Patient denies hx of cancer, stroke, seizures, lung problems, heart problems, diabetes, and unexplained weight loss.   PAIN:  Are you having pain? Yes: NPRS scale: 7-8/10 Pain location: Right knee Pain description: ache- sharp Aggravating factors: bending, sitting, standing , walking Relieving factors: rest, meds  PRECAUTIONS: Knee  RED FLAGS: Bowel or bladder incontinence: Yes: bowel     WEIGHT BEARING RESTRICTIONS: No  FALLS:  Has patient fallen in last 6 months? 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  NEXT MD VISIT: Next month- Dr. Magnus Ivan  OBJECTIVE:  Note: Objective measures were completed at Evaluation unless otherwise noted.  DIAGNOSTIC FINDINGS:  Narrative & Impression  CLINICAL DATA:  Status post right knee replacement.   EXAM: PORTABLE RIGHT KNEE - 1-2 VIEW   COMPARISON:  06/22/2023   FINDINGS: Right knee arthroplasty in expected alignment. No periprosthetic lucency or fracture. There has been patellar resurfacing. Recent postsurgical change includes air and edema in the soft tissues and joint space. Chronic ghost tracks in the distal femur. Anterior skin staples in place.   IMPRESSION: Right knee arthroplasty without immediate postoperative complication.     Electronically Signed   By: Narda Rutherford M.D.   On: 09/19/2023 16:43    PATIENT SURVEYS:  FOTO 39 with goal of 54  COGNITION: Overall cognitive status: Within functional limits for tasks assessed     SENSATION: Light touch: Impaired - reported dead zone lateral joint line  EDEMA:  Circumferential:  1)bottom of incision=38; 2)10 cm above distal incision-=48, 3) top of incision= 49    POSTURE: No Significant postural limitations  PALPATION: Global tenderness along right knee- joint line and posterior knee  LOWER EXTREMITY ROM:  Active ROM  Right eval Left eval  Hip flexion    Hip extension    Hip abduction    Hip adduction    Hip internal rotation    Hip external rotation    Knee flexion 90deg   Knee extension Lack 12 deg   Ankle dorsiflexion    Ankle plantarflexion    Ankle inversion    Ankle eversion     (Blank rows = not tested)  LOWER EXTREMITY MMT:  MMT Right eval Left eval  Hip flexion 4 5  Hip extension    Hip abduction 4 5  Hip adduction    Hip internal rotation    Hip external rotation    Knee flexion 3+ in available ROM 5  Knee extension 3+  in available ROM 5  Ankle dorsiflexion 4 5  Ankle plantarflexion    Ankle inversion    Ankle eversion     (Blank rows = not tested)  LOWER EXTREMITY SPECIAL TESTS:  N/a  FUNCTIONAL TESTS:  5 times sit to stand: Will assess visit #2 Timed up and go (TUG): Will assess visit #2 10 meter walk test: Will assess visit #2  GAIT: Distance walked: 75 feet Assistive device utilized: Single point cane Level of assistance: SBA Comments: antalgic on right- limited right knee flex and push off   TODAY'S TREATMENT:                                                                                                                              DATE: 10/26/23    Nustep (adjusted seat to work on increasing knee flex) From seat 11 to 9 (6 min total)  LE only  Instructed in standing knee strengthening: 2 sets of 10 reps Stand hip march Stand hip abd Stand hip ext Stand knee flex Stand minisquat Stand calf raises  Stand toe raises.   Manual therapy: Grade 2-3 PA/AP joint glides Tibiofemoral x 30 x 3 in progressive knee flex ROM. PROM to right knee flex x several min= measured at 105  deg AROM after today.      PATIENT EDUCATION:  Education details: Purpose of PT, HEP, Pain management strategies including Ice, ROM, taking meds as prescribed.  Person educated: Patient Education method: Explanation, Demonstration, Tactile cues, Verbal cues, and Handouts Education comprehension: verbalized understanding, returned demonstration, verbal cues required, tactile cues required, and needs further education  HOME EXERCISE PROGRAM: Access Code: [email or web address] URL: [email or web address] Date: 10/25/2023 Prepared by: Maureen Ralphs  Exercises - Standing Hip Flexion March  - 1 x daily - 7 x weekly - 3 sets - 10 reps - Standing Hip Abduction with Counter Support  - 1 x daily - 7 x weekly - 3 sets - 10 reps - Standing Hip Extension with Counter Support  - 1 x daily - 7 x weekly - 3 sets -  10 reps - Standing Knee Flexion with Unilateral Counter Support  - 1 x daily - 3 sets - 10  reps - Mini Squat with Counter Support  - 1 x daily - 3 sets - 10 reps - Heel Toe Raises with Unilateral Counter Support  - 1 x daily - 1 x weekly - 3 sets - 10 reps - Toe Raises with Counter Support  - 1 x daily - 1 x weekly - 3 sets - 10 reps    Access Code: [facility] URL: [email or web address] Date: 10/11/2023 Prepared by: Maureen Ralphs  Exercises - Seated Hamstring Stretch  - 2 x daily - 3-5 sets - 30-45 hold - Seated Knee Flexion Stretch  - 2 x daily - 3-5 sets - 30-45 hold  ASSESSMENT:  CLINICAL IMPRESSION: Treatment focused on LE strengthening that patient could perform at home today.  He Responded very well to instruction in some basic LE standing therex and reported no increased pain. He continues to steadily progress with Knee AROM despite ongoing swelling.  Patient will benefit from skilled PT services to improve his knee range of motion, LE strength, pain, and all functional mobility to enable him to return to independent walking and previous level of function.    OBJECTIVE IMPAIRMENTS: Abnormal gait, decreased activity tolerance, decreased balance, decreased endurance, decreased mobility, difficulty walking, decreased ROM, decreased strength, hypomobility, impaired flexibility, and pain.   ACTIVITY LIMITATIONS: carrying, lifting, bending, sitting, standing, squatting, sleeping, stairs, transfers, bed mobility, and toileting  PARTICIPATION LIMITATIONS: meal prep, cleaning, laundry, driving, shopping, community activity, occupation, and yard work  PERSONAL FACTORS: 1-2 comorbidities: chronic pain, HTN  are also affecting patient's functional outcome.   REHAB POTENTIAL: Good  CLINICAL DECISION MAKING: Stable/uncomplicated  EVALUATION COMPLEXITY: Low   GOALS: Goals reviewed with patient? Yes  SHORT TERM GOALS: Target date: 11/08/2023 Pt will be independent  with HEP in order to decrease ankle pain and increase strength in order to improve pain-free function at home and work.  Baseline: EVAL- No formal HEP in place Goal status: INITIAL   LONG TERM GOALS: Target date: 12/06/2023  Pt will decrease worst pain as reported on NPRS by at least 3 points in order to demonstrate clinically significant reduction in ankle/foot pain.  Baseline: EVAL- R knee pain = 7-8/10 Goal status: INITIAL  2.  Pt will improve FOTO to target score of 54 to display perceived improvements in ability to complete ADL's.  Baseline: EVAL= 39 Goal status: INITIAL  3.  Patient will improve Right knee AROM extension to <5 deg from zero for improved ability to straighten his leg with all mobility.  Baseline: EVAL= 12 deg Goal status: INITIAL  4.  Patient will improve Right knee AROM Flexion to > 120 deg for optimal right knee mobility with steps and sitting.  Baseline: EVAL= 90 deg Goal status: INITIAL      5.  Patient (> 37 years old) will complete five times sit to stand test in < 15 seconds indicating an increased LE strength and improved balance. Baseline: EVAL: to be assessed next visit Goal status: INITIAL     6.   Patient will reduce timed up and go to <11 seconds to reduce fall risk and demonstrate improved transfer/gait ability. Baseline: EVAL: To be assessed next visit Goal status: INITIAL  7.   Patient will increase 10 meter walk test to >1.19m/s as to improve gait speed for better community ambulation and to reduce fall risk. Baseline: EVAL- To be assessed next visit Goal status: INITIAL    PLAN:  PT FREQUENCY: 1-2x/week  PT DURATION: 8 weeks  PLANNED INTERVENTIONS: 40981- PT Re-evaluation,  97110-Therapeutic exercises, 97530- Therapeutic activity, [id number]- Neuromuscular re-education, [phone]- Self Care, 29562- Manual therapy, [phone]- Gait training, [phone]- Orthotic Fit/training, [phone]- Electrical stimulation (manual), [phone]- Ultrasound, Patient/Family  education, Balance training, Stair training, Taping, Dry Needling, Joint mobilization, Manual lymph drainage, Scar mobilization, Compression bandaging, Cryotherapy, and Moist heat  PLAN FOR NEXT SESSION: Continue with testing - 5xSTS, 10 MWT, TUG, Manual therapy for ROM, TE for strengthening.    Lenda Kelp, PT 10/26/2023, 8:27 AM

## 2023-10-25 NOTE — Assessment & Plan Note (Addendum)
#  Iron deficiency anemia, history of gastri bypas.  previously tolerated IV Venofer treatments. Labs reviewed and discussed with patient Lab Results  Component Value Date   HGB 11.5 (L) 10/23/2023   TIBC 403 10/23/2023   IRONPCTSAT 13 (L) 10/23/2023   FERRITIN 49 10/23/2023    Recommend Venofer 200mg  x 1 today.  After that recommend patient to contnue oral ferrous sulfate supplementation every other day

## 2023-10-25 NOTE — Patient Instructions (Signed)
Iron Sucrose Injection What is this medication? IRON SUCROSE (EYE ern SOO krose) treats low levels of iron (iron deficiency anemia) in people with kidney disease. Iron is a mineral that plays an important role in making red blood cells, which carry oxygen from your lungs to the rest of your body. This medicine may be used for other purposes; ask your health care provider or pharmacist if you have questions. COMMON BRAND NAME(S): Venofer What should I tell my care team before I take this medication? They need to know if you have any of these conditions: Anemia not caused by low iron levels Heart disease High levels of iron in the blood Kidney disease Liver disease An unusual or allergic reaction to iron, other medications, foods, dyes, or preservatives Pregnant or trying to get pregnant Breastfeeding How should I use this medication? This medication is for infusion into a vein. It is given in a hospital or clinic setting. Talk to your care team about the use of this medication in children. While this medication may be prescribed for children as young as 2 years for selected conditions, precautions do apply. Overdosage: If you think you have taken too much of this medicine contact a poison control center or emergency room at once. NOTE: This medicine is only for you. Do not share this medicine with others. What if I miss a dose? Keep appointments for follow-up doses. It is important not to miss your dose. Call your care team if you are unable to keep an appointment. What may interact with this medication? Do not take this medication with any of the following: Deferoxamine Dimercaprol Other iron products This medication may also interact with the following: Chloramphenicol Deferasirox This list may not describe all possible interactions. Give your health care provider a list of all the medicines, herbs, non-prescription drugs, or dietary supplements you use. Also tell them if you smoke,  drink alcohol, or use illegal drugs. Some items may interact with your medicine. What should I watch for while using this medication? Visit your care team regularly. Tell your care team if your symptoms do not start to get better or if they get worse. You may need blood work done while you are taking this medication. You may need to follow a special diet. Talk to your care team. Foods that contain iron include: whole grains/cereals, dried fruits, beans, or peas, leafy green vegetables, and organ meats (liver, kidney). What side effects may I notice from receiving this medication? Side effects that you should report to your care team as soon as possible: Allergic reactions--skin rash, itching, hives, swelling of the face, lips, tongue, or throat Low blood pressure--dizziness, feeling faint or lightheaded, blurry vision Shortness of breath Side effects that usually do not require medical attention (report to your care team if they continue or are bothersome): Flushing Headache Joint pain Muscle pain Nausea Pain, redness, or irritation at injection site This list may not describe all possible side effects. Call your doctor for medical advice about side effects. You may report side effects to FDA at 1-800-FDA-1088. Where should I keep my medication? This medication is given in a hospital or clinic. It will not be stored at home. NOTE: This sheet is a summary. It may not cover all possible information. If you have questions about this medicine, talk to your doctor, pharmacist, or health care provider.  2024 Elsevier/Gold Standard (2023-05-19 00:00:00)

## 2023-10-25 NOTE — Progress Notes (Signed)
Hematology/Oncology Progress note [phone] [phone]        CHIEF COMPLAINTS/REASON FOR VISIT:  Follow-up for iron deficiency anemia.   ASSESSMENT & PLAN:   Iron deficiency anemia #Iron deficiency anemia, history of gastri bypas.  previously tolerated IV Venofer treatments. Labs reviewed and discussed with patient Lab Results  Component Value Date   HGB 11.5 (L) 10/23/2023   TIBC 403 10/23/2023   IRONPCTSAT 13 (L) 10/23/2023   FERRITIN 49 10/23/2023    Recommend Venofer 200mg  x 1 today.  After that recommend patient to contnue oral ferrous sulfate supplementation every other day    Orders Placed This Encounter  Procedures   CBC with Differential (Cancer Center Only)    Standing Status:   Future    Standing Expiration Date:   10/24/2024   Iron and TIBC    Standing Status:   Future    Standing Expiration Date:   10/24/2024   Ferritin    Standing Status:   Future    Standing Expiration Date:   10/24/2024   Retic Panel    Standing Status:   Future    Standing Expiration Date:   10/24/2024   CBC with Differential (Cancer Center Only)    Standing Status:   Future    Standing Expiration Date:   10/24/2024   Iron and TIBC    Standing Status:   Future    Standing Expiration Date:   10/24/2024   Ferritin    Standing Status:   Future    Standing Expiration Date:   10/24/2024   Retic Panel    Standing Status:   Future    Standing Expiration Date:   10/24/2024   Follow up in 6 months.  All questions were answered. The patient knows to call the clinic with any problems, questions or concerns.  Rickard Patience, MD, PhD [facility] Hematology Oncology 10/25/2023     HISTORY OF PRESENTING ILLNESS:   Jerrett Durling is a  57 y.o.  male with PMH listed below was seen in consultation at the request of  Tillman Abide I, MD  for evaluation of anemia  05/03/2022, patient had preop blood work done showed hemoglobin of 8.3, hematocrit 75.  Platelet  count 258, white count 8.5.  Patient had plan of back surgery which was aborted due to decreased hemoglobin. Reviewed his previous lab records 01/30/2022, hemoglobin 10, MCV 88.6, 07/02/2021, hemoglobin 11, MCV 90.2. Patient denies any black or bloody stool.  Denies any unintentional weight loss, night sweats, fever, epigastric discomfort. Patient takes Mobic as needed for pain. 11/10/2021, colonoscopy showed 2 small polyps in the proximal transverse colon.  Pathology showed tubular adenoma. Family history is positive for father who passed away from colorectal cancer at age of 44.  11/30/22 colonoscopy showed  Six 3 to 6 mm polyps in the descending colon, in the transverse colon and in the cecum, removed with a cold snare. Resected and retrieved.- The distal rectum and anal verge are normal on retroflexion view Pathology showed multiple tubular adenoma, hyperplastic polyp. Negative for dysplasia and malignancy  .  INTERVAL HISTORY Kaye Fynn Reves is a 57 y.o. male who has above history reviewed by me today presents for follow up visit for iron deficiency anemia. She tolerates IV Venofer treatments, he tolerates well.  09/19/23 right knee replacement No new complaints.   Review of Systems  Constitutional:  Negative for appetite change, chills, fatigue, fever and unexpected weight change.  HENT:   Negative for hearing  loss and voice change.   Eyes:  Negative for eye problems and icterus.  Respiratory:  Negative for chest tightness, cough and shortness of breath.   Cardiovascular:  Negative for chest pain and leg swelling.  Gastrointestinal:  Negative for abdominal distention and abdominal pain.  Endocrine: Negative for hot flashes.  Genitourinary:  Negative for difficulty urinating, dysuria and frequency.   Musculoskeletal:  Positive for back pain. Negative for arthralgias.  Skin:  Negative for itching and rash.  Neurological:  Negative for light-headedness and numbness.  Hematological:   Negative for adenopathy. Does not bruise/bleed easily.  Psychiatric/Behavioral:  Negative for confusion.     MEDICAL HISTORY:  Past Medical History:  Diagnosis Date   Allergic rhinitis due to pollen    Anemia    Anxiety    Chronic pain    Colon polyps    Depression    Diverticulitis large intestine    Erosive esophagitis    GERD (gastroesophageal reflux disease)    HTN (hypertension)    Irritable bowel syndrome    Metabolic bone disease 09/01/2022   Obesity    Osteoarthritis, knee    Sleep apnea    does not use cpap, pt states he no longer needed CPAP due to weight loss   Sleep disturbance    Vitamin D deficiency 09/01/2022    SURGICAL HISTORY: Past Surgical History:  Procedure Laterality Date   ABDOMINAL EXPOSURE N/A 03/06/2023   Procedure: ABDOMINAL EXPOSURE;  Surgeon: Cephus Shelling, MD;  Location: [facility] OR;  Service: Vascular;  Laterality: N/A;   ACHILLES TENDON REPAIR Right 12/26/2006   ANTERIOR LUMBAR FUSION N/A 03/06/2023   Procedure: L5-S1 ANTERIOR LUMBAR FUSION 1 LEVEL;  Surgeon: London Sheer, MD;  Location: [facility] OR;  Service: Orthopedics;  Laterality: N/A;   BIOPSY  08/29/2023   Procedure: BIOPSY;  Surgeon: Toney Reil, MD;  Location: [facility] ENDOSCOPY;  Service: Gastroenterology;;   CATARACT EXTRACTION W/PHACO Left 07/20/2021   Procedure: CATARACT EXTRACTION PHACO AND INTRAOCULAR LENS PLACEMENT (IOC) LEFT 2.11 00:24.0;  Surgeon: Galen Manila, MD;  Location: [facility] SURGERY CNTR;  Service: Ophthalmology;  Laterality: Left;  sleep apnea   CATARACT EXTRACTION W/PHACO Right 08/03/2021   Procedure: CATARACT EXTRACTION PHACO AND INTRAOCULAR LENS PLACEMENT (IOC) RIGHT;  Surgeon: Galen Manila, MD;  Location: [facility] SURGERY CNTR;  Service: Ophthalmology;  Laterality: Right;  3.19 0:33.0   CHOLECYSTECTOMY     COLON RESECTION Left 12/26/2004   due to diverticular disease at [facility]   COLONOSCOPY  2013?   COLONOSCOPY N/A 11/30/2022   Procedure:  COLONOSCOPY;  Surgeon: Toney Reil, MD;  Location: [facility] ENDOSCOPY;  Service: Gastroenterology;  Laterality: N/A;   COLONOSCOPY WITH PROPOFOL N/A 05/22/2018   Procedure: COLONOSCOPY WITH PROPOFOL;  Surgeon: Toney Reil, MD;  Location: [facility] ENDOSCOPY;  Service: Gastroenterology;  Laterality: N/A;   COLONOSCOPY WITH PROPOFOL N/A 10/22/2020   Procedure: COLONOSCOPY WITH PROPOFOL;  Surgeon: Toney Reil, MD;  Location: [facility] SURGERY CNTR;  Service: Endoscopy;  Laterality: N/A;  priority 4   COLONOSCOPY WITH PROPOFOL N/A 11/10/2021   Procedure: COLONOSCOPY WITH PROPOFOL;  Surgeon: Toney Reil, MD;  Location: [facility] ENDOSCOPY;  Service: Gastroenterology;  Laterality: N/A;   COLONOSCOPY WITH PROPOFOL N/A 06/16/2022   Procedure: COLONOSCOPY WITH PROPOFOL;  Surgeon: Toney Reil, MD;  Location: [facility] SURGERY CNTR;  Service: Endoscopy;  Laterality: N/A;   COLONOSCOPY WITH PROPOFOL N/A 11/29/2022   Procedure: COLONOSCOPY WITH PROPOFOL;  Surgeon: Toney Reil, MD;  Location: [facility] ENDOSCOPY;  Service: Gastroenterology;  Laterality: N/A;   DRUG INDUCED ENDOSCOPY N/A 06/10/2019   Procedure: DRUG INDUCED SLEEP ENDOSCOPY;  Surgeon: Osborn Coho, MD;  Location: [facility];  Service: ENT;  Laterality: N/A;   ESOPHAGOGASTRODUODENOSCOPY (EGD) WITH PROPOFOL N/A 06/16/2022   Procedure: ESOPHAGOGASTRODUODENOSCOPY (EGD) WITH PROPOFOL;  Surgeon: Toney Reil, MD;  Location: [facility] SURGERY CNTR;  Service: Endoscopy;  Laterality: N/A;   ESOPHAGOGASTRODUODENOSCOPY (EGD) WITH PROPOFOL N/A 05/24/2023   Procedure: ESOPHAGOGASTRODUODENOSCOPY (EGD) WITH PROPOFOL;  Surgeon: Toney Reil, MD;  Location: [facility] ENDOSCOPY;  Service: Gastroenterology;  Laterality: N/A;   ESOPHAGOGASTRODUODENOSCOPY (EGD) WITH PROPOFOL N/A 08/29/2023   Procedure: ESOPHAGOGASTRODUODENOSCOPY (EGD) WITH PROPOFOL;  Surgeon: Toney Reil, MD;  Location: [facility]COPY;  Service:  Gastroenterology;  Laterality: N/A;   GASTRIC BYPASS N/A 12/27/2007   HARDWARE REMOVAL Right 06/22/2023   Procedure: HARDWARE REMOVAL;  Surgeon: Myrene Galas, MD;  Location: [facility] OR;  Service: Orthopedics;  Laterality: Right;   HIATAL HERNIA REPAIR  09/25/2014   at [facility]   KNEE ARTHROSCOPY Right 06/22/2023   Procedure: ARTHROSCOPY KNEE;  Surgeon: Myrene Galas, MD;  Location[facility] OR;  Service: Orthopedics;  Laterality: Right;   ORIF FEMUR FRACTURE Right 08/30/2022   Procedure: OPEN REDUCTION INTERNAL FIXATION (ORIF) DISTAL FEMUR FRACTURE;  Surgeon: Myrene Galas, MD;  Location: [facility] OR;  Service: Orthopedics;  Laterality: Right;   POLYPECTOMY  10/22/2020   Procedure: POLYPECTOMY;  Surgeon: Toney Reil, MD;  Location: [facility] SURGERY CNTR;  Service: Endoscopy;;   POLYPECTOMY  06/16/2022   Procedure: POLYPECTOMY;  Surgeon: Toney Reil, MD;  Location: [facility] SURGERY CNTR;  Service: Endoscopy;;   ROTATOR CUFF REPAIR Right    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   TOTAL KNEE ARTHROPLASTY Right 09/19/2023   Procedure: RIGHT TOTAL KNEE ARTHROPLASTY;  Surgeon: Kathryne Hitch, MD;  Location: [facility] OR;  Service: Orthopedics;  Laterality: Right;    SOCIAL HISTORY: Social History   Socioeconomic History   Marital status: Single    Spouse name: Not on file   Number of children: 3   Years of education: Not on file   Highest education level: Some college, no degree  Occupational History   Occupation: Hydrographic surveyor  Tobacco Use   Smoking status: Some Days    Types: Cigars    Passive exposure: Past   Smokeless tobacco: Never   Tobacco comments:    occasional cigar  Vaping Use   Vaping status: Never Used  Substance and Sexual Activity   Alcohol use: Yes    Comment: Occasionally   Drug use: No   Sexual activity: Yes  Other Topics Concern   Not on file  Social History Narrative   Not on file   Social Determinants of Health   Financial Resource  Strain: Medium Risk (10/12/2023)   Overall Financial Resource Strain (CARDIA)    Difficulty of Paying Living Expenses: Somewhat hard  Food Insecurity: Food Insecurity Present (10/12/2023)   Hunger Vital Sign    Worried About Running Out of Food in the Last Year: Sometimes true    Ran Out of Food in the Last Year: Sometimes true  Transportation Needs: No Transportation Needs (10/12/2023)   PRAPARE - Administrator, Civil Service (Medical): No    Lack of Transportation (Non-Medical): No  Physical Activity: Unknown (10/12/2023)   Exercise Vital Sign    Days of Exercise per Week: 0 days    Minutes of Exercise per Session: Not on file  Stress: Stress Concern Present (10/12/2023)   [employer] of Occupational Health - Occupational Stress Questionnaire    Feeling of Stress : Very much  Social Connections: Moderately Integrated (10/12/2023)   Social Connection and Isolation Panel [NHANES]    Frequency of Communication with Friends and Family: More than three times a week    Frequency of Social Gatherings with Friends and Family: Once a week    Attends Religious Services: 1 to 4 times per year    Active Member of [employer] or Organizations: Yes    Attends Banker Meetings: 1 to 4 times per year    Marital Status: Divorced  Catering manager Violence: Not At Risk (03/06/2023)   Humiliation, Afraid, Rape, and Kick questionnaire    Fear of Current or Ex-Partner: No    Emotionally Abused: No    Physically Abused: No    Sexually Abused: No    FAMILY HISTORY: Family History  Problem Relation Age of Onset   Hypertension Mother    Diabetes Mother    Kidney disease Mother    Heart disease Mother    Colon cancer Father 26   Asthma Sister    Obesity Sister    Stroke Sister    Obesity Brother    Diabetes Other    Heart disease Other    Hypertension Other    Kidney disease Other    Cancer Paternal Uncle        unk type, possible prostate   Lung cancer Paternal  Grandmother     ALLERGIES:  has No Known Allergies.  MEDICATIONS:  Current Outpatient Medications  Medication Sig Dispense Refill   alendronate (FOSAMAX) 70 MG tablet Take 1 tablet (70 mg total) by mouth once a week. Take with a full glass of water on an empty stomach. 13 tablet 3   aspirin 81 MG chewable tablet Chew 1 tablet (81 mg total) by mouth 2 (two) times daily. 30 tablet 0   baclofen (LIORESAL) 10 MG tablet Take 1 tablet (10 mg total) by mouth 3 (three) times daily. 90 tablet 0   CALCIUM PO Take 1 tablet by mouth daily.     carboxymethylcellulose (REFRESH PLUS) 0.5 % SOLN Place 1 drop into both eyes daily as needed (dry eyes).     cetirizine (ZYRTEC) 10 MG tablet Take 1 tablet (10 mg total) by mouth daily. 90 tablet 3   Cyanocobalamin (VITAMIN B-12 PO) Take 1 tablet by mouth daily.     cyclobenzaprine (FLEXERIL) 5 MG tablet Take 1-2 tablets (5-10 mg total) by mouth 3 (three) times daily as needed for muscle spasms. 90 tablet 1   dicyclomine (BENTYL) 10 MG capsule Take 1 to 2 capsules as needed for abdominal cramps and diarrhea every 6 to 8 hours 240 capsule 3   doxycycline (VIBRA-TABS) 100 MG tablet Take 1 tablet (100 mg total) by mouth 2 (two) times daily. 30 tablet 0   DULoxetine (CYMBALTA) 60 MG capsule TAKE 1 CAPSULE BY MOUTH ONCE DAILY 90 capsule 3   ferrous sulfate (FEROSUL) 325 (65 FE) MG tablet TAKE ONE TABLET EACH MORNING WITH BREAKFAST 30 tablet 11   fluticasone (FLONASE) 50 MCG/ACT nasal spray Place 2 sprays into both nostrils daily. (Patient taking differently: Place 2 sprays into both nostrils daily as needed for allergies or rhinitis.) 48 g 3   gabapentin (NEURONTIN) 300 MG capsule TAKE 1 CAPSULE BY MOUTH TWICE DAILY AND TAKE 4 CAPSULES AT BEDTIME (Patient taking differently: Take 300-1,200 mg by mouth 3 (three)  times daily as needed (pain).) 180 capsule 5   hydrochlorothiazide (HYDRODIURIL) 25 MG tablet TAKE 1 TABLET BY MOUTH DAILY 90 tablet 3   hydrocortisone  (ANUSOL-HC) 25 MG suppository Place 1 suppository (25 mg total) rectally daily. 14 suppository 0   lipase/protease/amylase (CREON) 36000 UNITS CPEP capsule Take 2 capsules (72,000 Units total) by mouth 3 (three) times daily with meals. May also take 1 capsule (36,000 Units total) as needed (with snacks). (Patient taking differently: Take 36,000 units by mouth 3 (three) times daily as needed based on meal size and type ) 240 capsule 5   lisinopril (ZESTRIL) 20 MG tablet TAKE 1 TABLET BY MOUTH DAILY 90 tablet 3   meloxicam (MOBIC) 15 MG tablet TAKE 1 TABLET BY MOUTH DAILY AS NEEDED FOR PAIN 90 tablet 1   Multiple Vitamin (MULTIVITAMIN) tablet Take 1 tablet by mouth daily.     naloxone (NARCAN) nasal spray 4 mg/0.1 mL Place 1 spray into the nose once.     omeprazole (PRILOSEC) 40 MG capsule Take 1 capsule (40 mg total) by mouth 2 (two) times daily before a meal. 180 capsule 3   oxyCODONE-acetaminophen (PERCOCET/ROXICET) 5-325 MG tablet Take 1-2 tablets by mouth every 6 (six) hours as needed.     QUEtiapine (SEROQUEL) 25 MG tablet TAKE 1-2 TABLETS BY MOUTH AT BEDTIME AS NEEDED FOR SLEEP 60 tablet 0   Sodium Fluoride (PREVIDENT 5000 BOOSTER PLUS DT) Place 1 application  onto teeth 2 (two) times daily.     tadalafil (CIALIS) 20 MG tablet Take 1 tablet (20 mg total) by mouth daily as needed for erectile dysfunction. 10 tablet 11   traZODone (DESYREL) 50 MG tablet Take 1-2 tablets (50-100 mg total) by mouth at bedtime as needed for sleep. 180 tablet 1   calcitRIOL (ROCALTROL) 0.25 MCG capsule Take 1 capsule (0.25 mcg total) by mouth daily. 90 capsule 2   Vitamin D, Ergocalciferol, (DRISDOL) 1.25 MG (50000 UNIT) CAPS capsule Take 1 capsule (50,000 Units total) by mouth every 7 (seven) days. 13 capsule 2   No current facility-administered medications for this visit.     PHYSICAL EXAMINATION: ECOG PERFORMANCE STATUS: 1 - Symptomatic but completely ambulatory Vitals:   10/25/23 1126  BP: 110/67  Pulse:  (!) 103  Resp: 18  Temp: 98.2 F (36.8 C)  SpO2: 99%   Filed Weights   10/25/23 1126  Weight: 192 lb 6.4 oz (87.3 kg)    Physical Exam HENT:     Head: Normocephalic and atraumatic.  Eyes:     General: No scleral icterus. Cardiovascular:     Rate and Rhythm: Normal rate.  Pulmonary:     Effort: Pulmonary effort is normal. No respiratory distress.     Breath sounds: No wheezing.  Abdominal:     General: Bowel sounds are normal. There is no distension.     Palpations: Abdomen is soft.  Musculoskeletal:        General: No deformity. Normal range of motion.     Cervical back: Normal range of motion and neck supple.  Skin:    General: Skin is warm and dry.     Findings: No erythema or rash.  Neurological:     Mental Status: He is alert and oriented to person, place, and time. Mental status is at baseline.     Cranial Nerves: No cranial nerve deficit.  Psychiatric:        Mood and Affect: Mood normal.     LABORATORY DATA:  I have reviewed  the data as listed    Latest Ref Rng & Units 10/23/2023    9:44 AM 09/20/2023    3:42 AM 09/19/2023   10:43 AM  CBC  WBC 4.0 - 10.5 K/uL 9.1  13.4    Hemoglobin 13.0 - 17.0 g/dL 16.1  09.6  04.5   Hematocrit 39.0 - 52.0 % 36.8  34.0  39.0   Platelets 150 - 400 K/uL 255  235        Latest Ref Rng & Units 10/23/2023    8:59 AM 09/20/2023    3:42 AM 09/19/2023   10:43 AM  CMP  Glucose 70 - 99 mg/dL 409  811  914   BUN 6 - 23 mg/dL 12  15  17    Creatinine 0.40 - 1.50 mg/dL 7.82  9.56  2.13   Sodium 135 - 145 mEq/L 141  134  140   Potassium 3.5 - 5.1 mEq/L 4.5  3.8  3.7   Chloride 96 - 112 mEq/L 109  102  106   CO2 19 - 32 mEq/L 28  22    Calcium 8.4 - 10.5 mg/dL 8.5  8.4    Total Protein 6.0 - 8.3 g/dL 6.6     Total Bilirubin 0.2 - 1.2 mg/dL 0.4     Alkaline Phos 39 - 117 U/L 84     AST 0 - 37 U/L 37     ALT 0 - 53 U/L 38        Iron/TIBC/Ferritin/ %Sat    Component Value Date/Time   IRON 52 10/23/2023 0944   TIBC 403  10/23/2023 0944   FERRITIN 49 10/23/2023 0944   IRONPCTSAT 13 (L) 10/23/2023 0944      RADIOGRAPHIC STUDIES: I have personally reviewed the radiological images as listed and agreed with the findings in the report. CT LUMBAR SPINE WO CONTRAST  Result Date: 10/17/2023 CLINICAL DATA:  Previous L5-S1 ALIF. Persistent back pain following surgery. EXAM: CT LUMBAR SPINE WITHOUT CONTRAST TECHNIQUE: Multidetector CT imaging of the lumbar spine was performed without intravenous contrast administration. Multiplanar CT image reconstructions were also generated. RADIATION DOSE REDUCTION: This exam was performed according to the departmental dose-optimization program which includes automated exposure control, adjustment of the mA and/or kV according to patient size and/or use of iterative reconstruction technique. COMPARISON:  08/30/2023.  MRI 11/01/2022. FINDINGS: Segmentation: 5 lumbar type vertebral bodies as numbered previously. Alignment: Fixed anterolisthesis at L5-S1 of 3 mm. Vertebrae: Old augmented compression fracture at L1. No sign of additional collapse. Mild posterior bowing of the posterosuperior margin of the vertebral body but no likely compressive encroachment upon the canal. Newly seen large superior endplate Schmorl's node at L4 which could relate to regional pain. Small inferior endplate Schmorl's node at L3. Paraspinal and other soft tissues: Negative Disc levels: No compressive or likely symptomatic finding at L2-3 or above. L3-4: Mild bulging of the disc. No compressive stenosis. As noted above, there is a large Schmorl's node within the central L4 vertebral body which could relate to pain. L5-S1: Previous anterior discectomy. Interbody spacer appears well positioned without evidence of loosening or subsidence. Pedicle screws at L5 and S1 with posterior rods. Screws are well positioned without evidence of loosening. 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 No apparent  compressive narrowing of the canal or foramina. IMPRESSION: 1. L5-S1: Previous anterior discectomy and fusion. No evidence of hardware failure. 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 No apparent compressive narrowing of  the canal or foramina. 2. Newly seen large superior endplate Schmorl's node at L4 which could relate to regional pain. Small inferior endplate Schmorl's node at L3. 3. Old augmented compression fracture at L1. No sign of additional collapse. Mild posterior bowing of the posterosuperior margin of the vertebral body but no likely compressive encroachment upon the canal. Electronically Signed   By: Paulina Fusi M.D.   On: 10/17/2023 15:33   VAS [place] LOWER EXTREMITY VENOUS (DVT)  Result Date: 10/02/2023  Lower Venous DVT Study Patient Name:  MONTEGO SEEDORF [place]  Date of Exam:   10/02/2023 Medical Rec #: [id number]            Accession #:    [id number] Date of Birth: 1966/03/09             Patient Gender: M Patient Age:   57 years Exam Location:  [facility] Procedure:      VAS [place] LOWER EXTREMITY VENOUS (DVT) Referring Phys: Doneen Poisson --------------------------------------------------------------------------------  Indications: Swelling, and Pain.  Risk Factors: Surgery Right total knee replacement 09/19/2023 Comparison Study: No prior exam Performing Technologist: Elita Quick RVT  Examination Guidelines: A complete evaluation includes B-mode imaging, spectral Doppler, color Doppler, and power Doppler as needed of all accessible portions of each vessel. Bilateral testing is considered an integral part of a complete examination. Limited examinations for reoccurring indications may be performed as noted. The reflux portion of the exam is performed with the patient in reverse Trendelenburg.  +---------+---------------+---------+-----------+----------+--------------+ RIGHT    CompressibilityPhasicitySpontaneityPropertiesThrombus Aging  +---------+---------------+---------+-----------+----------+--------------+ CFV      Full           Yes      Yes                                 +---------+---------------+---------+-----------+----------+--------------+ SFJ      Full           Yes      Yes                                 +---------+---------------+---------+-----------+----------+--------------+ FV Prox  Full           Yes      Yes                                 +---------+---------------+---------+-----------+----------+--------------+ FV Mid   Full           Yes      Yes                                 +---------+---------------+---------+-----------+----------+--------------+ FV DistalFull           Yes      Yes                                 +---------+---------------+---------+-----------+----------+--------------+ PFV      Full           Yes      Yes                                 +---------+---------------+---------+-----------+----------+--------------+ POP  Full           Yes      Yes                                 +---------+---------------+---------+-----------+----------+--------------+ PTV      Full           Yes      Yes                                 +---------+---------------+---------+-----------+----------+--------------+ PERO     Full           Yes      Yes                                 +---------+---------------+---------+-----------+----------+--------------+ Gastroc  Full           Yes      Yes                                 +---------+---------------+---------+-----------+----------+--------------+ GSV      Full           Yes      Yes                                 +---------+---------------+---------+-----------+----------+--------------+ SSV      Full           Yes      Yes                                 +---------+---------------+---------+-----------+----------+--------------+    +----+---------------+---------+-----------+----------+--------------+ LEFTCompressibilityPhasicitySpontaneityPropertiesThrombus Aging +----+---------------+---------+-----------+----------+--------------+ CFV Full           Yes      Yes                                 +----+---------------+---------+-----------+----------+--------------+    : Findings reported to Called results to triage at 8:07  Summary: RIGHT: - There is no evidence of deep vein thrombosis in the lower extremity. - There is no evidence of superficial venous thrombosis.   *See table(s) above for measurements and observations. Electronically signed by Coral Else MD on 10/02/2023 at 3:44:05 PM.    Final

## 2023-10-26 MED ORDER — HYDROMORPHONE HCL 4 MG PO TABS: 4.0000 mg | ORAL_TABLET | Freq: Four times a day (QID) | ORAL | 0 refills | Status: AC | PRN

## 2023-10-27 DIAGNOSIS — Z419 Encounter for procedure for purposes other than remedying health state, unspecified: Secondary | ICD-10-CM | POA: Diagnosis not present

## 2023-10-30 ENCOUNTER — Ambulatory Visit (INDEPENDENT_AMBULATORY_CARE_PROVIDER_SITE_OTHER): Payer: 59 | Admitting: Orthopaedic Surgery

## 2023-10-30 ENCOUNTER — Encounter: Payer: Self-pay | Admitting: Orthopaedic Surgery

## 2023-10-30 DIAGNOSIS — Z7689 Persons encountering health services in other specified circumstances: Secondary | ICD-10-CM | POA: Diagnosis not present

## 2023-10-30 DIAGNOSIS — Z96651 Presence of right artificial knee joint: Secondary | ICD-10-CM

## 2023-10-30 NOTE — Progress Notes (Signed)
The patient is now about 6 weeks status post a right total knee arthroplasty.  He says he is still having some pain and significant swelling but overall he is making progress.  He is walking with a walking stick.  On exam he looks good overall.  He lacks full extension by about 3 degrees and I can flex him to about 100 degrees.  There is significant swelling in the knee which is to be expected.  It feels stable on my exam.  He will continue to increase his activities as comfort allows.  The next time I need to see him is in 3 months.  Will have a standing AP and lateral of his right knee at that visit.

## 2023-11-02 ENCOUNTER — Telehealth: Payer: Self-pay | Admitting: Orthopaedic Surgery

## 2023-11-02 ENCOUNTER — Ambulatory Visit: Payer: 59 | Attending: Orthopaedic Surgery

## 2023-11-02 DIAGNOSIS — G8929 Other chronic pain: Secondary | ICD-10-CM | POA: Diagnosis not present

## 2023-11-02 DIAGNOSIS — M2560 Stiffness of unspecified joint, not elsewhere classified: Secondary | ICD-10-CM

## 2023-11-02 DIAGNOSIS — R269 Unspecified abnormalities of gait and mobility: Secondary | ICD-10-CM

## 2023-11-02 DIAGNOSIS — R262 Difficulty in walking, not elsewhere classified: Secondary | ICD-10-CM | POA: Diagnosis not present

## 2023-11-02 DIAGNOSIS — Z7689 Persons encountering health services in other specified circumstances: Secondary | ICD-10-CM | POA: Diagnosis not present

## 2023-11-02 DIAGNOSIS — M25561 Pain in right knee: Secondary | ICD-10-CM

## 2023-11-02 DIAGNOSIS — M6281 Muscle weakness (generalized): Secondary | ICD-10-CM

## 2023-11-02 DIAGNOSIS — M25661 Stiffness of right knee, not elsewhere classified: Secondary | ICD-10-CM | POA: Diagnosis not present

## 2023-11-02 NOTE — Telephone Encounter (Signed)
Patient called asked if he can can get a letter that he saw Dr Magnus Ivan on 10/30/2023 at 8:45am. Patient said it need to be on [facility]'s letterhead. Patient asked if the letter can be put in [employer] and he will print it off. Patient asked if he could get Dr. Magnus Ivan or Gil's signature on the letter?  Patient said he gets reimbursed for mileage. Patient asked for a call when the letter is ready.   The number to contact patient is [phone]

## 2023-11-02 NOTE — Therapy (Signed)
OUTPATIENT PHYSICAL THERAPY LOWER EXTREMITY TREATMENT   Patient Name: Harold Smith MRN: [id number] DOB:January 08, 1966, 57 y.o., male Today's Date: 11/02/2023  END OF SESSION:  PT End of Session - 11/02/23 0930     Visit Number 4    Number of Visits 14    Date for PT Re-Evaluation 12/06/23    Authorization Type [employer] 2024    Authorization - Number of Visits 14    Progress Note Due on Visit 10    PT Start Time 0930    PT Stop Time 1013    PT Time Calculation (min) 43 min    Equipment Utilized During Treatment Gait belt    Activity Tolerance Patient tolerated treatment well;No increased pain    Behavior During Therapy WFL for tasks assessed/performed               Past Medical History:  Diagnosis Date   Allergic rhinitis due to pollen    Anemia    Anxiety    Chronic pain    Colon polyps    Depression    Diverticulitis large intestine    Erosive esophagitis    GERD (gastroesophageal reflux disease)    HTN (hypertension)    Irritable bowel syndrome    Metabolic bone disease 09/01/2022   Obesity    Osteoarthritis, knee    Sleep apnea    does not use cpap, pt states he no longer needed CPAP due to weight loss   Sleep disturbance    Vitamin D deficiency 09/01/2022   Past Surgical History:  Procedure Laterality Date   ABDOMINAL EXPOSURE N/A 03/06/2023   Procedure: ABDOMINAL EXPOSURE;  Surgeon: Cephus Shelling, MD;  Location: [facility] OR;  Service: Vascular;  Laterality: N/A;   ACHILLES TENDON REPAIR Right 12/26/2006   ANTERIOR LUMBAR FUSION N/A 03/06/2023   Procedure: L5-S1 ANTERIOR LUMBAR FUSION 1 LEVEL;  Surgeon: London Sheer, MD;  Location: [facility] OR;  Service: Orthopedics;  Laterality: N/A;   BIOPSY  08/29/2023   Procedure: BIOPSY;  Surgeon: Toney Reil, MD;  Location: [facility] ENDOSCOPY;  Service: Gastroenterology;;   CATARACT EXTRACTION W/PHACO Left 07/20/2021   Procedure: CATARACT EXTRACTION PHACO AND INTRAOCULAR LENS PLACEMENT (IOC)  LEFT 2.11 00:24.0;  Surgeon: Galen Manila, MD;  Location: [facility] SURGERY CNTR;  Service: Ophthalmology;  Laterality: Left;  sleep apnea   CATARACT EXTRACTION W/PHACO Right 08/03/2021   Procedure: CATARACT EXTRACTION PHACO AND INTRAOCULAR LENS PLACEMENT (IOC) RIGHT;  Surgeon: Galen Manila, MD;  Location: [facility] SURGERY CNTR;  Service: Ophthalmology;  Laterality: Right;  3.19 0:33.0   CHOLECYSTECTOMY     COLON RESECTION Left 12/26/2004   due to diverticular disease at [facility]   COLONOSCOPY  2013?   COLONOSCOPY N/A 11/30/2022   Procedure: COLONOSCOPY;  Surgeon: Toney Reil, MD;  Location: [facility] ENDOSCOPY;  Service: Gastroenterology;  Laterality: N/A;   COLONOSCOPY WITH PROPOFOL N/A 05/22/2018   Procedure: COLONOSCOPY WITH PROPOFOL;  Surgeon: Toney Reil, MD;  Location: [facility] ENDOSCOPY;  Service: Gastroenterology;  Laterality: N/A;   COLONOSCOPY WITH PROPOFOL N/A 10/22/2020   Procedure: COLONOSCOPY WITH PROPOFOL;  Surgeon: Toney Reil, MD;  Location: [facility] SURGERY CNTR;  Service: Endoscopy;  Laterality: N/A;  priority 4   COLONOSCOPY WITH PROPOFOL N/A 11/10/2021   Procedure: COLONOSCOPY WITH PROPOFOL;  Surgeon: Toney Reil, MD;  Location: [facility] ENDOSCOPY;  Service: Gastroenterology;  Laterality: N/A;   COLONOSCOPY WITH PROPOFOL N/A 06/16/2022   Procedure: COLONOSCOPY WITH PROPOFOL;  Surgeon: Toney Reil, MD;  Location: [facility]  SURGERY CNTR;  Service: Endoscopy;  Laterality: N/A;   COLONOSCOPY WITH PROPOFOL N/A 11/29/2022   Procedure: COLONOSCOPY WITH PROPOFOL;  Surgeon: Toney Reil, MD;  Location: [facility] ENDOSCOPY;  Service: Gastroenterology;  Laterality: N/A;   DRUG INDUCED ENDOSCOPY N/A 06/10/2019   Procedure: DRUG INDUCED SLEEP ENDOSCOPY;  Surgeon: Osborn Coho, MD;  Location: [facility];  Service: ENT;  Laterality: N/A;   ESOPHAGOGASTRODUODENOSCOPY (EGD) WITH PROPOFOL N/A 06/16/2022   Procedure: ESOPHAGOGASTRODUODENOSCOPY  (EGD) WITH PROPOFOL;  Surgeon: Toney Reil, MD;  Location: [facility] SURGERY [facility]TR;  Service: Endoscopy;  Laterality: N/A;   ESOPHAGOGASTRODUODENOSCOPY (EGD) WITH PROPOFOL N/A 05/24/2023   Procedure: ESOPHAGOGASTRODUODENOSCOPY (EGD) WITH PROPOFOL;  Surgeon: Toney Reil, MD;  Location: [facility] ENDOSCOPY;  Service: Gastroenterology;  Laterality: N/A;   ESOPHAGOGASTRODUODENOSCOPY (EGD) WITH PROPOFOL N/A 08/29/2023   Procedure: ESOPHAGOGASTRODUODENOSCOPY (EGD) WITH PROPOFOL;  Surgeon: Toney Reil, MD;  Location: [facility] ENDOSCOPY;  Service: Gastroenterology;  Laterality: N/A;   GASTRIC BYPASS N/A 12/27/2007   HARDWARE REMOVAL Right 06/22/2023   Procedure: HARDWARE REMOVAL;  Surgeon: Myrene Galas, MD;  Location: [facility] OR;  Service: Orthopedics;  Laterality: Right;   HIATAL HERNIA REPAIR  09/25/2014   at [facility]   KNEE ARTHROSCOPY Right 06/22/2023   Procedure: ARTHROSCOPY KNEE;  Surgeon: Myrene Galas, MD;  Location: [facility] OR;  Service: Orthopedics;  Laterality: Right;   ORIF FEMUR FRACTURE Right 08/30/2022   Procedure: OPEN REDUCTION INTERNAL FIXATION (ORIF) DISTAL FEMUR FRACTURE;  Surgeon: Myrene Galas, MD;  Location: [facility] OR;  Service: Orthopedics;  Laterality: Right;   POLYPECTOMY  10/22/2020   Procedure: POLYPECTOMY;  Surgeon: Toney Reil, MD;  Location: [facility] SURGERY CNTR;  Service: Endoscopy;;   POLYPECTOMY  06/16/2022   Procedure: POLYPECTOMY;  Surgeon: Toney Reil, MD;  Location: [facility] SURGERY CNTR;  Service: Endoscopy;;   ROTATOR CUFF REPAIR Right    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   TOTAL KNEE ARTHROPLASTY Right 09/19/2023   Procedure: RIGHT TOTAL KNEE ARTHROPLASTY;  Surgeon: Kathryne Hitch, MD;  Location: [facility] OR;  Service: Orthopedics;  Laterality: Right;   Patient Active Problem List   Diagnosis Date Noted   Secondary hyperparathyroidism, non-renal (HCC) 10/25/2023   Age-related osteoporosis without current pathological fracture  10/23/2023   S/P total knee arthroplasty, right 09/19/2023   Status post total right knee replacement 09/19/2023   Chronic pain syndrome 07/12/2023   Anxiety 09/01/2022   Vitamin D deficiency 09/01/2022   Metabolic bone disease  09/01/2022   Displaced supracondylar fracture of distal end of right femur without intracondylar extension (HCC) 08/29/2022   Mood disorder (HCC) 07/08/2022   Disability of walking 06/01/2022   Degeneration of lumbar intervertebral disc 06/01/2022   Iron deficiency anemia 05/10/2022   Lumbar spondylolysis 02/25/2022   Sleep disturbance 07/02/2021   Preventative health care 06/23/2018   Personal hx of gastric bypass 06/23/2018   Erectile dysfunction 06/23/2018   Hx of adenomatous colonic polyps    Essential hypertension 10/17/2013   Diverticulosis large intestine w/o perforation or abscess w/bleeding    Colon polyps     PCP: Dr. Tillman Abide  REFERRING PROVIDER: Dr. Doneen Poisson  REFERRING DIAG: [id number] (ICD-10-CM) - Status post total right knee replacement   THERAPY DIAG:  Muscle weakness (generalized)  Limited joint range of motion (ROM)  Difficulty in walking, not elsewhere classified  Abnormality of gait and mobility  Acute pain of right knee  Rationale for Evaluation and Treatment: Rehabilitation  ONSET DATE: 09/19/2023  SUBJECTIVE:  SUBJECTIVE STATEMENT: Patient reports doing better overall- Able to bend my knee a little more.     PERTINENT HISTORY:  Per ortho note from Dr. Magnus Ivan: Ilona Sorrel, 57 y.o. male, has a history of pain and functional disability in the right knee due to trauma and has failed non-surgical conservative treatments for greater than 12 weeks to includeNSAID's and/or analgesics, corticosteriod injections, use of assistive devices, and activity modification.  Onset of symptoms was abrupt, starting 1 years ago with gradually worsening course since that time. The patient noted prior procedures on  the knee to include  ORIF  on the right knee(s).  Per previous admission chart review from Lesli Albee, PT, DPT: Relevant past medical history and comorbidities include s/p hardware removal and arthroscopy of R knee on 06/22/2023 (hardware originally for distal medial condyle fracture);  s/p L5-S1 ANTERIOR LUMBAR FUSION on 03/06/2023; Diverticulosis large intestine w/o perforation or abscess w/bleeding; Essential hypertension; Rectal bleeding; Hx of adenomatous colonic polyps; Personal hx of gastric bypass; Erectile dysfunction; Sleep disturbance; Lumbar spondylolysis; Microcytic anemia; Iron deficiency anemia; Numbness and tingling; Imbalance; Difficulty sleeping; Disability of walking; Pain in joint involving ankle and foot; Degeneration of lumbar intervertebral disc; Erosive esophagitis; Mood disorder (HCC); Displaced supracondylar fracture of distal end of right femur without intracondylar extension (HCC); Anxiety; Vitamin D deficiency; Metabolic bone disease; and Radiculopathy, lumbar region. Please see above for relevant surgery history; osteoporosis, bowel/urinary incontinence, stumbling and dropping things. Patient denies hx of cancer, stroke, seizures, lung problems, heart problems, diabetes, and unexplained weight loss.   PAIN:  Are you having pain? Yes: NPRS scale: 7-8/10 Pain location: Right knee Pain description: ache- sharp Aggravating factors: bending, sitting, standing , walking Relieving factors: rest, meds  PRECAUTIONS: Knee  RED FLAGS: Bowel or bladder incontinence: Yes: bowel     WEIGHT BEARING RESTRICTIONS: No  FALLS:  Has patient fallen in last 6 months? 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  NEXT MD VISIT: Next month- Dr. Magnus Ivan  OBJECTIVE:  Note: Objective measures were completed at Evaluation unless otherwise noted.  DIAGNOSTIC FINDINGS:  Narrative & Impression  CLINICAL DATA:  Status post right knee replacement.   EXAM: PORTABLE RIGHT KNEE - 1-2 VIEW   COMPARISON:  06/22/2023   FINDINGS: Right knee arthroplasty in expected alignment. No periprosthetic lucency or fracture. There has been patellar resurfacing. Recent postsurgical change includes air and edema in the soft tissues and joint space. Chronic ghost tracks in the distal femur. Anterior skin staples in place.   IMPRESSION: Right knee arthroplasty without immediate postoperative complication.     Electronically Signed   By: Narda Rutherford M.D.   On: 09/19/2023 16:43    PATIENT SURVEYS:  FOTO 39 with goal of 54  COGNITION: Overall cognitive status: Within functional limits for tasks assessed     SENSATION: Light touch: Impaired - reported dead zone lateral joint line  EDEMA:  Circumferential:  1)bottom of incision=38; 2)10 cm above distal incision-=48, 3) top of incision= 49    POSTURE: No Significant postural limitations  PALPATION: Global tenderness along right knee- joint line and posterior knee  LOWER EXTREMITY ROM:  Active ROM Right eval Left  eval  Hip flexion    Hip extension    Hip abduction    Hip adduction    Hip internal rotation    Hip external rotation    Knee flexion 90deg   Knee extension Lack 12 deg   Ankle dorsiflexion    Ankle plantarflexion    Ankle inversion    Ankle eversion     (Blank rows = not tested)  LOWER EXTREMITY MMT:  MMT Right eval Left eval  Hip flexion 4 5  Hip extension    Hip abduction 4 5  Hip adduction    Hip internal rotation    Hip external rotation    Knee flexion 3+ in available ROM 5  Knee extension 3+ in available ROM 5  Ankle dorsiflexion 4  5  Ankle plantarflexion    Ankle inversion    Ankle eversion     (Blank rows = not tested)  LOWER EXTREMITY SPECIAL TESTS:  N/a  FUNCTIONAL TESTS:  5 times sit to stand: Will assess visit #2 Timed up and go (TUG): Will assess visit #2 10 meter walk test: Will assess visit #2  GAIT: Distance walked: 75 feet Assistive device utilized: Single point cane Level of assistance: SBA Comments: antalgic on right- limited right knee flex and push off   TODAY'S TREATMENT:                                                                                                                              DATE: 11/02/23    Nustep (adjusted seat to work on increasing knee flex) From seat 11 to 8 (6 min total)  LE only - L1 to L3.    Seated LAQ 3# 2 sets x 12 reps Seated Hip abd- 3# around ankles and BTB tied around knees- 2 sets x 12 reps Standing - step ups 3# onto 6" block x 10 reps Standing- dynamic high knee march/walk in // bars 3# downd and back x 5 Standing- Hip abd- side stepping in // bars 3# x 5 (down and back)  Walking ham curls 3# in // bars down and back x 2 Standing calf raises 3# on 1/2 foam roll 2 sets of 12 reps  Manual therapy: Grade 2-3 PA/AP joint glides Tibiofemoral x 30 x 3 in progressive knee flex ROM. PROM to right knee flex x several min= measured at 101 deg AROM and 108 deg AAROM today.      PATIENT EDUCATION:  Education details: Purpose of PT, HEP, Pain management strategies including Ice, ROM, taking meds as prescribed.  Person educated: Patient Education method: Explanation, Demonstration, Tactile cues, Verbal cues, and Handouts Education comprehension: verbalized understanding, returned demonstration, verbal cues required, tactile cues required, and needs further education  HOME EXERCISE PROGRAM: Access Code: [email or web address] URL: [email or web address] Date: 10/25/2023 Prepared by: Maureen Ralphs  Exercises - Standing Hip Flexion March  - 1 x  daily - 7 x weekly - 3 sets - 10 reps -  Standing Hip Abduction with Counter Support  - 1 x daily - 7 x weekly - 3 sets - 10 reps - Standing Hip Extension with Counter Support  - 1 x daily - 7 x weekly - 3 sets - 10 reps - Standing Knee Flexion with Unilateral Counter Support  - 1 x daily - 3 sets - 10 reps - Mini Squat with Counter Support  - 1 x daily - 3 sets - 10 reps - Heel Toe Raises with Unilateral Counter Support  - 1 x daily - 1 x weekly - 3 sets - 10 reps - Toe Raises with Counter Support  - 1 x daily - 1 x weekly - 3 sets - 10 reps    Access Code: [facility] URL: [email or web address] Date: 10/11/2023 Prepared by: Maureen Ralphs  Exercises - Seated Hamstring Stretch  - 2 x daily - 3-5 sets - 30-45 hold - Seated Knee Flexion Stretch  - 2 x daily - 3-5 sets - 30-45 hold  ASSESSMENT:  CLINICAL IMPRESSION: Patient continues to respond very well overall to treatment. He is progressing with overall ROM and able to progress with more functional mobility exercises. No increased pain except for end range PROM today.  Patient will benefit from skilled PT services to improve his knee range of motion, LE strength, pain, and all functional mobility to enable him to return to independent walking and previous level of function.    OBJECTIVE IMPAIRMENTS: Abnormal gait, decreased activity tolerance, decreased balance, decreased endurance, decreased mobility, difficulty walking, decreased ROM, decreased strength, hypomobility, impaired flexibility, and pain.   ACTIVITY LIMITATIONS: carrying, lifting, bending, sitting, standing, squatting, sleeping, stairs, transfers, bed mobility, and toileting  PARTICIPATION LIMITATIONS: meal prep, cleaning, laundry, driving, shopping, community activity, occupation, and yard work  PERSONAL FACTORS: 1-2 comorbidities: chronic pain, HTN  are also affecting patient's functional outcome.   REHAB POTENTIAL: Good  CLINICAL DECISION MAKING:  Stable/uncomplicated  EVALUATION COMPLEXITY: Low   GOALS: Goals reviewed with patient? Yes  SHORT TERM GOALS: Target date: 11/08/2023 Pt will be independent with HEP in order to decrease ankle pain and increase strength in order to improve pain-free function at home and work.  Baseline: EVAL- No formal HEP in place Goal status: INITIAL   LONG TERM GOALS: Target date: 12/06/2023  Pt will decrease worst pain as reported on NPRS by at least 3 points in order to demonstrate clinically significant reduction in ankle/foot pain.  Baseline: EVAL- R knee pain = 7-8/10 Goal status: INITIAL  2.  Pt will improve FOTO to target score of 54 to display perceived improvements in ability to complete ADL's.  Baseline: EVAL= 39 Goal status: INITIAL  3.  Patient will improve Right knee AROM extension to <5 deg from zero for improved ability to straighten his leg with all mobility.  Baseline: EVAL= 12 deg Goal status: INITIAL  4.  Patient will improve Right knee AROM Flexion to > 120 deg for optimal right knee mobility with steps and sitting.  Baseline: EVAL= 90 deg Goal status: INITIAL      5.  Patient (> 63 years old) will complete five times sit to stand test in < 15 seconds indicating an increased LE strength and improved balance. Baseline: EVAL: to be assessed next visit Goal status: INITIAL     6.   Patient will reduce timed up and go to <11 seconds to reduce fall risk and demonstrate improved transfer/gait ability. Baseline: EVAL: To be assessed next visit Goal status: INITIAL  7.  Patient will increase 10 meter walk test to >1.42m/s as to improve gait speed for better community ambulation and to reduce fall risk. Baseline: EVAL- To be assessed next visit Goal status: INITIAL    PLAN:  PT FREQUENCY: 1-2x/week  PT DURATION: 8 weeks  PLANNED INTERVENTIONS: 97164- PT Re-evaluation, 97110-Therapeutic exercises, 97530- Therapeutic activity, [id number]- Neuromuscular re-education,  97535- Self Care, 82956- Manual therapy, [id number]- Gait training, [phone]- Orthotic Fit/training, [phone]- Electrical stimulation (manual), [phone]- Ultrasound, Patient/Family education, Balance training, Stair training, Taping, Dry Needling, Joint mobilization, Manual lymph drainage, Scar mobilization, Compression bandaging, Cryotherapy, and Moist heat  PLAN FOR NEXT SESSION: Continue with testing - 5xSTS, 10 MWT, TUG, Manual therapy for ROM, TE for strengthening.    Lenda Kelp, PT 11/02/2023, 1:23 PM

## 2023-11-06 NOTE — Therapy (Addendum)
OUTPATIENT PHYSICAL THERAPY LOWER EXTREMITY TREATMENT   Patient Name: Harold Smith MRN: [id number] DOB:1966/11/13, 57 y.o., male Today's Date: 11/07/2023  END OF SESSION:  PT End of Session - 11/07/23 1103     Visit Number 5    Number of Visits 14    Date for PT Re-Evaluation 12/06/23    Authorization Type [employer] 2024    Authorization - Number of Visits 14    Progress Note Due on Visit 10    PT Start Time 1058    PT Stop Time 1145    PT Time Calculation (min) 47 min    Equipment Utilized During Treatment Gait belt    Activity Tolerance Patient tolerated treatment well;No increased pain    Behavior During Therapy WFL for tasks assessed/performed                Past Medical History:  Diagnosis Date   Allergic rhinitis due to pollen    Anemia    Anxiety    Chronic pain    Colon polyps    Depression    Diverticulitis large intestine    Erosive esophagitis    GERD (gastroesophageal reflux disease)    HTN (hypertension)    Irritable bowel syndrome    Metabolic bone disease 09/01/2022   Obesity    Osteoarthritis, knee    Sleep apnea    does not use cpap, pt states he no longer needed CPAP due to weight loss   Sleep disturbance    Vitamin D deficiency 09/01/2022   Past Surgical History:  Procedure Laterality Date   ABDOMINAL EXPOSURE N/A 03/06/2023   Procedure: ABDOMINAL EXPOSURE;  Surgeon: Cephus Shelling, MD;  Location: [facility] OR;  Service: Vascular;  Laterality: N/A;   ACHILLES TENDON REPAIR Right 12/26/2006   ANTERIOR LUMBAR FUSION N/A 03/06/2023   Procedure: L5-S1 ANTERIOR LUMBAR FUSION 1 LEVEL;  Surgeon: London Sheer, MD;  Location: [facility] OR;  Service: Orthopedics;  Laterality: N/A;   BIOPSY  08/29/2023   Procedure: BIOPSY;  Surgeon: Toney Reil, MD;  Location: [facility] ENDOSCOPY;  Service: Gastroenterology;;   CATARACT EXTRACTION W/PHACO Left 07/20/2021   Procedure: CATARACT EXTRACTION PHACO AND INTRAOCULAR LENS PLACEMENT  (IOC) LEFT 2.11 00:24.0;  Surgeon: Galen Manila, MD;  Location: [facility] SURGERY CNTR;  Service: Ophthalmology;  Laterality: Left;  sleep apnea   CATARACT EXTRACTION W/PHACO Right 08/03/2021   Procedure: CATARACT EXTRACTION PHACO AND INTRAOCULAR LENS PLACEMENT (IOC) RIGHT;  Surgeon: Galen Manila, MD;  Location: [facility];  Service: Ophthalmology;  Laterality: Right;  3.19 0:33.0   CHOLECYSTECTOMY     COLON RESECTION Left 12/26/2004   due to diverticular disease at [facility]   COLONOSCOPY  2013?   COLONOSCOPY N/A 11/30/2022   Procedure: COLONOSCOPY;  Surgeon: Toney Reil, MD;  Location: [facility] ENDOSCOPY;  Service: Gastroenterology;  Laterality: N/A;   COLONOSCOPY WITH PROPOFOL N/A 05/22/2018   Procedure: COLONOSCOPY WITH PROPOFOL;  Surgeon: Toney Reil, MD;  Location: [facility] ENDOSCOPY;  Service: Gastroenterology;  Laterality: N/A;   COLONOSCOPY WITH PROPOFOL N/A 10/22/2020   Procedure: COLONOSCOPY WITH PROPOFOL;  Surgeon: Toney Reil, MD;  Location: [facility] SUR[facility]ERY [facility];  Service: Endoscopy;  Laterality: N/A;  priority 4   COLONOSCOPY WITH PROPOFOL N/A 11/10/2021   Procedure: COLONOSCOPY WITH PROPOFOL;  Surgeon: Toney Reil, MD;  Location: [facility] ENDOSCOPY;  Service: Gastroenterology;  Laterality: N/A;   COLONOSCOPY WITH PROPOFOL N/A 06/16/2022   Procedure: COLONOSCOPY WITH PROPOFOL;  Surgeon: Toney Reil, MD;  Location:  [facility];  Service: Endoscopy;  Laterality: N/A;   COLONOSCOPY WITH PROPOFOL N/A 11/29/2022   Procedure: COLONOSCOPY WITH PROPOFOL;  Surgeon: Toney Reil, MD;  Location: [facility] ENDOSCOPY;  Service: Gastroenterology;  Laterality: N/A;   DRUG INDUCED ENDOSCOPY N/A 06/10/2019   Procedure: DRUG INDUCED SLEEP ENDOSCOPY;  Surgeon: Osborn Coho, MD;  Location: [facility];  Service: ENT;  Laterality: N/A;   ESOPHAGOGASTRODUODENOSCOPY (EGD) WITH PROPOFOL N/A 06/16/2022   Procedure:  ESOPHAGOGASTRODUODENOSCOPY (EGD) WITH PROPOFOL;  Surgeon: Toney Reil, MD;  Location: [facility] SURGERY CNTR;  Service: Endoscopy;  Laterality: N/A;   ESOPHAGOGASTRODUODENOSCOPY (EGD) WITH PROPOFOL N/A 05/24/2023   Procedure: ESOPHAGOGASTRODUODENOSCOPY (EGD) WITH PROPOFOL;  Surgeon: Toney Reil, MD;  Location: [facility] ENDOSCOPY;  Service: Gastroenterology;  Laterality: N/A;   ESOPHAGOGASTRODUODENOSCOPY (EGD) WITH PROPOFOL N/A 08/29/2023   Procedure: ESOPHAGOGASTRODUODENOSCOPY (EGD) WITH PROPOFOL;  Surgeon: Toney Reil, MD;  Location: [facility] ENDOSCOPY;  Service: Gastroenterology;  Laterality: N/A;   GASTRIC BYPASS N/A 12/27/2007   HARDWARE REMOVAL Right 06/22/2023   Procedure: HARDWARE REMOVAL;  Surgeon: Myrene Galas, MD;  Location: [facility] OR;  Service: Orthopedics;  Laterality: Right;   HIATAL HERNIA REPAIR  09/25/2014   at [facility]   KNEE ARTHROSCOPY Right 06/22/2023   Procedure: ARTHROSCOPY KNEE;  Surgeon: Myrene Galas, MD;  Location: [facility] OR;  Service: Orthopedics;  Laterality: Right;   ORIF FEMUR FRACTURE Right 08/30/2022   Procedure: OPEN REDUCTION INTERNAL FIXATION (ORIF) DISTAL FEMUR FRACTURE;  Surgeon: Myrene Galas, MD;  Location: [facility] OR;  Service: Orthopedics;  Laterality: Right;   POLYPECTOMY  10/22/2020   Procedure: POLYPECTOMY;  Surgeon: Toney Reil, MD;  Location: [facility];  Service: Endoscopy;;   POLYPECTOMY  06/16/2022   Procedure: POLYPECTOMY;  Surgeon: Toney Reil, MD;  Location: [facility];  Service: Endoscopy;;   ROTATOR CUFF REPAIR Right    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   TOTAL KNEE ARTHROPLASTY Right 09/19/2023   Procedure: RIGHT TOTAL KNEE ARTHROPLASTY;  Surgeon: Kathryne Hitch, MD;  Location: [facility] OR;  Service: Orthopedics;  Laterality: Right;   Patient Active Problem List   Diagnosis Date Noted   Secondary hyperparathyroidism, non-renal (HCC) 10/25/2023   Age-related osteoporosis without current  pathological fracture 10/23/2023   S/P total knee arthroplasty, right 09/19/2023   Status post total right knee replacement 09/19/2023   Chronic pain syndrome 07/12/2023   Anxiety 09/01/2022   Vitamin D deficiency 09/01/2022   Metabolic bone disease  09/01/2022   Displaced supracondylar fracture of distal end of right femur without intracondylar extension (HCC) 08/29/2022   Mood disorder (HCC) 07/08/2022   Disability of walking 06/01/2022   Degeneration of lumbar intervertebral disc 06/01/2022   Iron deficiency anemia 05/10/2022   Lumbar spondylolysis 02/25/2022   Sleep disturbance 07/02/2021   Preventative health care 06/23/2018   Personal hx of gastric bypass 06/23/2018   Erectile dysfunction 06/23/2018   Hx of adenomatous colonic polyps    Essential hypertension 10/17/2013   Diverticulosis large intestine w/o perforation or abscess w/bleeding    Colon polyps     PCP: Dr. Tillman Abide  REFERRING PROVIDER: Dr. Doneen Poisson  REFERRING DIAG: [phone] (ICD-10-CM) - Status post total right knee replacement   THERAPY DIAG:  Muscle weakness (generalized)  Limited joint range of motion (ROM)  Difficulty in walking, not elsewhere classified  Abnormality of gait and mobility  Acute pain of right knee  Rationale for Evaluation and Treatment: Rehabilitation  ONSET DATE: 09/19/2023  SUBJECTIVE:  SUBJECTIVE STATEMENT: Patient reports very sore after last visit. States more pain in knee overall today- up to 9/10 but states wants to try again as he feels he did a lot of good but new exercises and his leg was just not ready but now that he is coming 2x week he feels he will better.     PERTINENT HISTORY:  Per ortho note from Dr. Magnus Ivan: Ilona Sorrel, 58 y.o. male, has a history of pain and functional disability in the right knee due to trauma and has failed non-surgical conservative treatments for greater than 12 weeks to includeNSAID's and/or analgesics,  corticosteriod injections, use of assistive devices, and activity modification.  Onset of symptoms was abrupt, starting 1 years ago with gradually worsening course since that time. The patient noted prior procedures on the knee to include  ORIF  on the right knee(s).  Per previous admission chart review from Lesli Albee, PT, DPT: Relevant past medical history and comorbidities include s/p hardware removal and arthroscopy of R knee on 06/22/2023 (hardware originally for distal medial condyle fracture);  s/p L5-S1 ANTERIOR LUMBAR FUSION on 03/06/2023; Diverticulosis large intestine w/o perforation or abscess w/bleeding; Essential hypertension; Rectal bleeding; Hx of adenomatous colonic polyps; Personal hx of gastric bypass; Erectile dysfunction; Sleep disturbance; Lumbar spondylolysis; Microcytic anemia; Iron deficiency anemia; Numbness and tingling; Imbalance; Difficulty sleeping; Disability of walking; Pain in joint involving ankle and foot; Degeneration of lumbar intervertebral disc; Erosive esophagitis; Mood disorder (HCC); Displaced supracondylar fracture of distal end of right femur without intracondylar extension (HCC); Anxiety; Vitamin D deficiency; Metabolic bone disease; and Radiculopathy, lumbar region. Please see above for relevant surgery history; osteoporosis, bowel/urinary incontinence, stumbling and dropping things. Patient denies hx of cancer, stroke, seizures, lung problems, heart problems, diabetes, and unexplained weight loss.   PAIN:  Are you having pain? Yes: NPRS scale: 7-8/10 Pain location: Right knee Pain description: ache- sharp Aggravating factors: bending, sitting, standing , walking Relieving factors: rest, meds  PRECAUTIONS: Knee  RED FLAGS: Bowel or bladder incontinence: Yes: bowel     WEIGHT BEARING RESTRICTIONS: No  FALLS:  Has patient fallen in last 6 months? 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  NEXT MD VISIT: Next month- Dr. Magnus Ivan  OBJECTIVE:  Note: Objective measures were completed at Evaluation unless otherwise noted.  DIAGNOSTIC FINDINGS:  Narrative & Impression  CLINICAL DATA:  Status post right knee replacement.   EXAM: PORTABLE RIGHT KNEE - 1-2 VIEW   COMPARISON:  06/22/2023   FINDINGS: Right knee arthroplasty in expected alignment. No periprosthetic lucency or fracture. There has been patellar resurfacing. Recent postsurgical change includes air and edema in the soft tissues and joint space. Chronic ghost tracks in the distal femur. Anterior skin staples in place.   IMPRESSION: Right knee arthroplasty without immediate postoperative complication.     Electronically Signed   By: Narda Rutherford M.D.   On: 09/19/2023 16:43    PATIENT SURVEYS:  FOTO 39 with goal of 54  COGNITION: Overall cognitive status: Within functional limits for tasks assessed     SENSATION: Light touch: Impaired - reported dead zone lateral joint line  EDEMA:  Circumferential:  1)bottom  of incision=38; 2)10 cm above distal incision-=48, 3) top of incision= 49    POSTURE: No Significant postural limitations  PALPATION: Global tenderness along right knee- joint line and posterior knee  LOWER EXTREMITY ROM:  Active ROM Right eval Left eval  Hip flexion    Hip extension    Hip abduction    Hip adduction    Hip internal rotation    Hip external rotation    Knee flexion 90deg   Knee extension Lack 12 deg   Ankle dorsiflexion    Ankle plantarflexion    Ankle inversion    Ankle eversion     (Blank rows = not tested)  LOWER EXTREMITY MMT:  MMT Right eval Left eval   Hip flexion 4 5  Hip extension    Hip abduction 4 5  Hip adduction    Hip internal rotation    Hip external rotation    Knee flexion 3+ in available ROM 5  Knee extension 3+ in available ROM 5  Ankle dorsiflexion 4 5  Ankle plantarflexion    Ankle inversion    Ankle eversion     (Blank rows = not tested)  LOWER EXTREMITY SPECIAL TESTS:  N/a  FUNCTIONAL TESTS:  5 times sit to stand: Will assess visit #2 Timed up and go (TUG): Will assess visit #2 10 meter walk test: Will assess visit #2  GAIT: Distance walked: 75 feet Assistive device utilized: Single point cane Level of assistance: SBA Comments: antalgic on right- limited right knee flex and push off   TODAY'S TREATMENT:                                                                                                                              DATE: 11/07/23    Nustep seat 9 (6 min total)  LE only - L1  for Knee ROM  Standing right knee stretching- knee flex then ext- 30 sec hold at edge of treadmill (LE propped up on belt)  x 3 each direction  Step up RLE with BUE Support with VC for slow eccentric control back to standing position- 12 reps RLE . Lateral step over orange hurdles x 15 reps R-L without UE support Standing calf raises on 1/2 foam roll 2 sets of 10 reps Seated ham curl using foot on sliding disc 2 sets of 10 reps  Manual therapy: Supine Grade 2-3 PA/AP joint glides Tibiofemoral x 30 x 3 in progressive knee flex ROM.from 10 deg of flex up toward 80 deg PROM to right knee flex x several min= measured at 101 deg AROM and 113 deg AAROM today.   Modalities: (not billed)  Ice pack to right knee in supine (pillow under calf)  x 6 min for pain and inflammation/swelling     PATIENT EDUCATION:  Education details: Purpose of PT, HEP, Pain management strategies including Ice, ROM, taking meds as prescribed.  Person educated: Patient Education method: Explanation, Demonstration, Tactile cues, Verbal  cues, and  Handouts Education comprehension: verbalized understanding, returned demonstration, verbal cues required, tactile cues required, and needs further education  HOME EXERCISE PROGRAM: Access Code: [email or web address] URL: [email or web address] Date: 10/25/2023 Prepared by: Maureen Ralphs  Exercises - Standing Hip Flexion March  - 1 x daily - 7 x weekly - 3 sets - 10 reps - Standing Hip Abduction with Counter Support  - 1 x daily - 7 x weekly - 3 sets - 10 reps - Standing Hip Extension with Counter Support  - 1 x daily - 7 x weekly - 3 sets - 10 reps - Standing Knee Flexion with Unilateral Counter Support  - 1 x daily - 3 sets - 10 reps - Mini Squat with Counter Support  - 1 x daily - 3 sets - 10 reps - Heel Toe Raises with Unilateral Counter Support  - 1 x daily - 1 x weekly - 3 sets - 10 reps - Toe Raises with Counter Support  - 1 x daily - 1 x weekly - 3 sets - 10 reps    Access Code: [facility] URL: [email or web address] Date: 10/11/2023 Prepared by: Maureen Ralphs  Exercises - Seated Hamstring Stretch  - 2 x daily - 3-5 sets - 30-45 hold - Seated Knee Flexion Stretch  - 2 x daily - 3-5 sets - 30-45 hold  ASSESSMENT:  CLINICAL IMPRESSION: Treatment modified today secondary to patient complaints from last visit being very sore and reported pain up to 9/10 prior to visit. He performed well overall today- able to participate well with ROM and LE strengthening activities. He was initially tight and continues to present with ongoing right LE swelling. Deferred using weighted resistance today and focused more on closed chain activities. He responded well and less complain of any pain with manual techniques. He is progressing with ROM as well demonstrating improved Right knee AAROM knee flex today at end of session.  Patient will benefit from skilled PT services to improve his knee range of motion, LE strength, pain, and all functional mobility to enable him to return  to independent walking and previous level of function.    OBJECTIVE IMPAIRMENTS: Abnormal gait, decreased activity tolerance, decreased balance, decreased endurance, decreased mobility, difficulty walking, decreased ROM, decreased strength, hypomobility, impaired flexibility, and pain.   ACTIVITY LIMITATIONS: carrying, lifting, bending, sitting, standing, squatting, sleeping, stairs, transfers, bed mobility, and toileting  PARTICIPATION LIMITATIONS: meal prep, cleaning, laundry, driving, shopping, community activity, occupation, and yard work  PERSONAL FACTORS: 1-2 comorbidities: chronic pain, HTN  are also affecting patient's functional outcome.   REHAB POTENTIAL: Good  CLINICAL DECISION MAKING: Stable/uncomplicated  EVALUATION COMPLEXITY: Low   GOALS: Goals reviewed with patient? Yes  SHORT TERM GOALS: Target date: 11/08/2023 Pt will be independent with HEP in order to decrease ankle pain and increase strength in order to improve pain-free function at home and work.  Baseline: EVAL- No formal HEP in place Goal status: INITIAL   LONG TERM GOALS: Target date: 12/06/2023  Pt will decrease worst pain as reported on NPRS by at least 3 points in order to demonstrate clinically significant reduction in ankle/foot pain.  Baseline: EVAL- R knee pain = 7-8/10 Goal status: INITIAL  2.  Pt will improve FOTO to target score of 54 to display perceived improvements in ability to complete ADL's.  Baseline: EVAL= 39 Goal status: INITIAL  3.  Patient will improve Right knee AROM extension to <5 deg from zero for improved ability to straighten his leg with all mobility.  Baseline:  EVAL= 12 deg Goal status: INITIAL  4.  Patient will improve Right knee AROM Flexion to > 120 deg for optimal right knee mobility with steps and sitting.  Baseline: EVAL= 90 deg Goal status: INITIAL      5.  Patient (> 56 years old) will complete five times sit to stand test in < 15 seconds indicating an  increased LE strength and improved balance. Baseline: EVAL: to be assessed next visit Goal status: INITIAL     6.   Patient will reduce timed up and go to <11 seconds to reduce fall risk and demonstrate improved transfer/gait ability. Baseline: EVAL: To be assessed next visit Goal status: INITIAL  7.   Patient will increase 10 meter walk test to >1.66m/s as to improve gait speed for better community ambulation and to reduce fall risk. Baseline: EVAL- To be assessed next visit Goal status: INITIAL    PLAN:  PT FREQUENCY: 1-2x/week  PT DURATION: 8 weeks  PLANNED INTERVENTIONS: 97164- PT Re-evaluation, 97110-Therapeutic exercises, 97530- Therapeutic activity, [id number]- Neuromuscular re-education, 97535- Self Care, [id number]- Manual therapy, [id number]- Gait training, [phone]- Orthotic Fit/training, [phone]- Electrical stimulation (manual), [phone]- Ultrasound, Patient/Family education, Balance training, Stair training, Taping, Dry Needling, Joint mobilization, Manual lymph drainage, Scar mobilization, Compression bandaging, Cryotherapy, and Moist heat  PLAN FOR NEXT SESSION: Continue with testing - 5xSTS, 10 MWT, TUG, Manual therapy for ROM, TE for strengthening.    Lenda Kelp, PT 11/07/2023, 12:08 PM

## 2023-11-07 ENCOUNTER — Ambulatory Visit: Payer: 59

## 2023-11-07 DIAGNOSIS — R262 Difficulty in walking, not elsewhere classified: Secondary | ICD-10-CM | POA: Diagnosis not present

## 2023-11-07 DIAGNOSIS — G8929 Other chronic pain: Secondary | ICD-10-CM | POA: Diagnosis not present

## 2023-11-07 DIAGNOSIS — M25561 Pain in right knee: Secondary | ICD-10-CM | POA: Diagnosis not present

## 2023-11-07 DIAGNOSIS — R269 Unspecified abnormalities of gait and mobility: Secondary | ICD-10-CM

## 2023-11-07 DIAGNOSIS — M6281 Muscle weakness (generalized): Secondary | ICD-10-CM

## 2023-11-07 DIAGNOSIS — M2560 Stiffness of unspecified joint, not elsewhere classified: Secondary | ICD-10-CM | POA: Diagnosis not present

## 2023-11-07 DIAGNOSIS — M25661 Stiffness of right knee, not elsewhere classified: Secondary | ICD-10-CM | POA: Diagnosis not present

## 2023-11-07 DIAGNOSIS — Z7689 Persons encountering health services in other specified circumstances: Secondary | ICD-10-CM | POA: Diagnosis not present

## 2023-11-09 ENCOUNTER — Ambulatory Visit: Payer: 59 | Admitting: Physical Therapy

## 2023-11-09 DIAGNOSIS — M25561 Pain in right knee: Secondary | ICD-10-CM

## 2023-11-09 DIAGNOSIS — R269 Unspecified abnormalities of gait and mobility: Secondary | ICD-10-CM

## 2023-11-09 DIAGNOSIS — R262 Difficulty in walking, not elsewhere classified: Secondary | ICD-10-CM | POA: Diagnosis not present

## 2023-11-09 DIAGNOSIS — M25661 Stiffness of right knee, not elsewhere classified: Secondary | ICD-10-CM

## 2023-11-09 DIAGNOSIS — M6281 Muscle weakness (generalized): Secondary | ICD-10-CM

## 2023-11-09 DIAGNOSIS — G8929 Other chronic pain: Secondary | ICD-10-CM | POA: Diagnosis not present

## 2023-11-09 DIAGNOSIS — Z7689 Persons encountering health services in other specified circumstances: Secondary | ICD-10-CM | POA: Diagnosis not present

## 2023-11-09 DIAGNOSIS — M2560 Stiffness of unspecified joint, not elsewhere classified: Secondary | ICD-10-CM

## 2023-11-09 NOTE — Therapy (Signed)
OUTPATIENT PHYSICAL THERAPY LOWER EXTREMITY TREATMENT   Patient Name: Harold Smith MRN: [id number] DOB:[birth year], 57 y.o., male Today's Date: 11/09/2023  END OF SESSION:  PT End of Session - 11/09/23 0808     Visit Number 6    Number of Visits 14    Date for PT Re-Evaluation 12/06/23    Authorization Type [employer] 2024    Authorization - Number of Visits 14    Progress Note Due on Visit 10    PT Start Time 0805    PT Stop Time 0845    PT Time Calculation (min) 40 min    Equipment Utilized During Treatment Gait belt    Activity Tolerance Patient tolerated treatment well;No increased pain    Behavior During Therapy WFL for tasks assessed/performed                Past Medical History:  Diagnosis Date   Allergic rhinitis due to pollen    Anemia    Anxiety    Chronic pain    Colon polyps    Depression    Diverticulitis large intestine    Erosive esophagitis    GERD (gastroesophageal reflux disease)    HTN (hypertension)    Irritable bowel syndrome    Metabolic bone disease 09/01/2022   Obesity    Osteoarthritis, knee    Sleep apnea    does not use cpap, pt states he no longer needed CPAP due to weight loss   Sleep disturbance    Vitamin D deficiency 09/01/2022   Past Surgical History:  Procedure Laterality Date   ABDOMINAL EXPOSURE N/A 03/06/2023   Procedure: ABDOMINAL EXPOSURE;  Surgeon: Cephus Shelling, MD;  Location: [facility] OR;  Service: Vascular;  Laterality: N/A;   ACHILLES TENDON REPAIR Right 12/26/2006   ANTERIOR LUMBAR FUSION N/A 03/06/2023   Procedure: L5-S1 ANTERIOR LUMBAR FUSION 1 LEVEL;  Surgeon: London Sheer, MD;  Location: [facility] OR;  Service: Orthopedics;  Laterality: N/A;   BIOPSY  08/29/2023   Procedure: BIOPSY;  Surgeon: Toney Reil, MD;  Location: [facility] ENDOSCOPY;  Service: Gastroenterology;;   CATARACT EXTRACTION W/PHACO Left 07/20/2021   Procedure: CATARACT EXTRACTION PHACO AND INTRAOCULAR LENS PLACEMENT  (IOC) LEFT 2.11 00:24.0;  Surgeon: Galen Manila, MD;  Location: [facility] SURGERY [facility]TR;  Service: Ophthalmology;  Laterality: Left;  sleep apnea   CATARACT EXTRACTION W/PHACO Right 08/03/2021   Procedure: CATARACT EXTRACTION PHACO AND INTRAOCULAR LENS PLACEMENT (IOC) RIGHT;  Surgeon: Galen Manila, MD;  Location: [facility]GERY [facility];  Service: Ophthalmology;  Laterality: Right;  3.19 0:33.0   CHOLECYSTECTOMY     COLON RESECTION Left 12/26/2004   due to diverticular disease at [facility]   COLONOSCOPY  2013?   COLONOSCOPY N/A 11/30/2022   Procedure: COLONOSCOPY;  Surgeon: Toney Reil, MD;  Location: [facility] ENDOSCOPY;  Service: Gastroenterology;  Laterality: N/A;   COLONOSCOPY WITH PROPOFOL N/A 05/22/2018   Procedure: COLONOSCOPY WITH PROPOFOL;  Surgeon: Toney Reil, MD;  Location: [facility] ENDOSCOPY;  Service: Gastroenterology;  Laterality: N/A;   COLONOSCOPY WITH PROPOFOL N/A 10/22/2020   Procedure: COLONOSCOPY WITH PROPOFOL;  Surgeon: Toney Reil, MD;  Location: [facility];  Service: Endoscopy;  Laterality: N/A;  priority 4   COLONOSCOPY WITH PROPOFOL N/A 11/10/2021   Procedure: COLONOSCOPY WITH PROPOFOL;  Surgeon: Toney Reil, MD;  Location: [facility] ENDOSCOPY;  Service: Gastroenterology;  Laterality: N/A;   COLONOSCOPY WITH PROPOFOL N/A 06/16/2022   Procedure: COLONOSCOPY WITH PROPOFOL;  Surgeon: Toney Reil, MD;  Location:  [facility];  Service: Endoscopy;  Laterality: N/A;   COLONOSCOPY WITH PROPOFOL N/A 11/29/2022   Procedure: COLONOSCOPY WITH PROPOFOL;  Surgeon: Toney Reil, MD;  Location: [facility] ENDOSCOPY;  Service: Gastroenterology;  Laterality: N/A;   DRUG INDUCED ENDOSCOPY N/A 06/10/2019   Procedure: DRUG INDUCED SLEEP ENDOSCOPY;  Surgeon: Osborn Coho, MD;  Location: [facility];  Service: ENT;  Laterality: N/A;   ESOPHAGOGASTRODUODENOSCOPY (EGD) WITH PROPOFOL N/A 06/16/2022   Procedure:  ESOPHAGOGASTRODUODENOSCOPY (EGD) WITH PROPOFOL;  Surgeon: Toney Reil, MD;  Location: [facility] SURGERY CNTR;  Service: Endoscopy;  Laterality: N/A;   ESOPHAGOGASTRODUODENOSCOPY (EGD) WITH PROPOFOL N/A 05/24/2023   Procedure: ESOPHAGOGASTRODUODENOSCOPY (EGD) WITH PROPOFOL;  Surgeon: Toney Reil, MD;  Location: [facility] ENDOSCOPY;  Service: Gastroenterology;  Laterality: N/A;   ESOPHAGOGASTRODUODENOSCOPY (EGD) WITH PROPOFOL N/A 08/29/2023   Procedure: ESOPHAGOGASTRODUODENOSCOPY (EGD) WITH PROPOFOL;  Surgeon: Toney Reil, MD;  Location: [facility] ENDOSCOPY;  Service: Gastroenterology;  Laterality: N/A;   GASTRIC BYPASS N/A 12/27/2007   HARDWARE REMOVAL Right 06/22/2023   Procedure: HARDWARE REMOVAL;  Surgeon: Myrene Galas, MD;  Location: [facility] OR;  Service: Orthopedics;  Laterality: Right;   HIATAL HERNIA REPAIR  09/25/2014   at [facility]   KNEE ARTHROSCOPY Right 06/22/2023   Procedure: ARTHROSCOPY KNEE;  Surgeon: Myrene Galas, MD;  Location: [facility] OR;  Service: Orthopedics;  Laterality: Right;   ORIF FEMUR FRACTURE Right 08/30/2022   Procedure: OPEN REDUCTION INTERNAL FIXATION (ORIF) DISTAL FEMUR FRACTURE;  Surgeon: Myrene Galas, MD;  Location: [facility] OR;  Service: Orthopedics;  Laterality: Right;   POLYPECTOMY  10/22/2020   Procedure: POLYPECTOMY;  Surgeon: Toney Reil, MD;  Location: [facility]ERY [facility];  Service: Endoscopy;;   POLYPECTOMY  06/16/2022   Procedure: POLYPECTOMY;  Surgeon: Toney Reil, MD;  Location: [facility];  Service: Endoscopy;;   ROTATOR CUFF REPAIR Right    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   TOTAL KNEE ARTHROPLASTY Right 09/19/2023   Procedure: RIGHT TOTAL KNEE ARTHROPLASTY;  Surgeon: Kathryne Hitch, MD;  Location: [facility] OR;  Service: Orthopedics;  Laterality: Right;   Patient Active Problem List   Diagnosis Date Noted   Secondary hyperparathyroidism, non-renal (HCC) 10/25/2023   Age-related osteoporosis without current  pathological fracture 10/23/2023   S/P total knee arthroplasty, right 09/19/2023   Status post total right knee replacement 09/19/2023   Chronic pain syndrome 07/12/2023   Anxiety 09/01/2022   Vitamin D deficiency 09/01/2022   Metabolic bone disease  09/01/2022   Displaced supracondylar fracture of distal end of right femur without intracondylar extension (HCC) 08/29/2022   Mood disorder (HCC) 07/08/2022   Disability of walking 06/01/2022   Degeneration of lumbar intervertebral disc 06/01/2022   Iron deficiency anemia 05/10/2022   Lumbar spondylolysis 02/25/2022   Sleep disturbance 07/02/2021   Preventative health care 06/23/2018   Personal hx of gastric bypass 06/23/2018   Erectile dysfunction 06/23/2018   Hx of adenomatous colonic polyps    Essential hypertension 10/17/2013   Diverticulosis large intestine w/o perforation or abscess w/bleeding    Colon polyps     PCP: Dr. Tillman Abide  REFERRING PROVIDER: Dr. Doneen Poisson  REFERRING DIAG: [phone] (ICD-10-CM) - Status post total right knee replacement   THERAPY DIAG:  Muscle weakness (generalized)  Limited joint range of motion (ROM)  Difficulty in walking, not elsewhere classified  Stiffness of right knee, not elsewhere classified  Abnormality of gait and mobility  Acute pain of right knee  Chronic pain of right knee  Rationale for Evaluation and Treatment: Rehabilitation  ONSET DATE: 09/19/2023  SUBJECTIVE:   SUBJECTIVE STATEMENT: Patient reports pain 7/10 at start of PT treatment. States that he is trying not to take pain meds unless absolutely necessary. Will need to cute PT treatment short to make it to dentist appointment. Also states that knee has been buckling intermittently    PERTINENT HISTORY:  Per ortho note from Dr. Magnus Ivan: Ilona Sorrel, 57 y.o. male, has a history of pain and functional disability in the right knee due to trauma and has failed non-surgical conservative treatments  for greater than 12 weeks to includeNSAID's and/or analgesics, corticosteriod injections, use of assistive devices, and activity modification.  Onset of symptoms was abrupt, starting 1 years ago with gradually worsening course since that time. The patient noted prior procedures on the knee to include  ORIF  on the right knee(s).  Per previous admission chart review from Lesli Albee, PT, DPT: Relevant past medical history and comorbidities include s/p hardware removal and arthroscopy of R knee on 06/22/2023 (hardware originally for distal medial condyle fracture);  s/p L5-S1 ANTERIOR LUMBAR FUSION on 03/06/2023; Diverticulosis large intestine w/o perforation or abscess w/bleeding; Essential hypertension; Rectal bleeding; Hx of adenomatous colonic polyps; Personal hx of gastric bypass; Erectile dysfunction; Sleep disturbance; Lumbar spondylolysis; Microcytic anemia; Iron deficiency anemia; Numbness and tingling; Imbalance; Difficulty sleeping; Disability of walking; Pain in joint involving ankle and foot; Degeneration of lumbar intervertebral disc; Erosive esophagitis; Mood disorder (HCC); Displaced supracondylar fracture of distal end of right femur without intracondylar extension (HCC); Anxiety; Vitamin D deficiency; Metabolic bone disease; and Radiculopathy, lumbar region. Please see above for relevant surgery history; osteoporosis, bowel/urinary incontinence, stumbling and dropping things. Patient denies hx of cancer, stroke, seizures, lung problems, heart problems, diabetes, and unexplained weight loss.   PAIN:  Are you having pain? Yes: NPRS scale: 7-8/10 Pain location: Right knee Pain description: ache- sharp Aggravating factors: bending, sitting, standing , walking Relieving factors: rest, meds  PRECAUTIONS: Knee  RED FLAGS: Bowel or bladder incontinence: Yes: bowel     WEIGHT BEARING RESTRICTIONS: No  FALLS:  Has patient fallen in last 6 months? 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  NEXT MD VISIT: Next month- Dr. Magnus Ivan  OBJECTIVE:  Note: Objective measures were completed at Evaluation unless otherwise noted.  DIAGNOSTIC FINDINGS:  Narrative & Impression  CLINICAL DATA:  Status post right knee replacement.   EXAM: PORTABLE RIGHT KNEE - 1-2 VIEW   COMPARISON:  06/22/2023   FINDINGS: Right knee arthroplasty in expected alignment. No periprosthetic lucency or fracture. There has been patellar resurfacing. Recent postsurgical change includes air and edema in the soft tissues and joint space. Chronic ghost tracks in the distal femur. Anterior skin staples in place.   IMPRESSION: Right knee arthroplasty without immediate postoperative complication.     Electronically Signed   By: Narda Rutherford M.D.   On: 09/19/2023 16:43    PATIENT SURVEYS:  FOTO 39 with goal of 54  COGNITION: Overall cognitive status: Within functional limits for tasks assessed     SENSATION: Light touch: Impaired - reported dead zone lateral joint line  EDEMA:  Circumferential:  1)bottom of incision=38; 2)10 cm above distal incision-=48, 3) top of incision= 49    POSTURE: No Significant postural limitations  PALPATION: Global tenderness along right knee- joint line and posterior knee  LOWER EXTREMITY ROM:  Active ROM Right eval Left eval  Hip flexion    Hip extension    Hip abduction    Hip adduction    Hip internal rotation    Hip external rotation    Knee flexion 90deg   Knee extension Lack 12 deg   Ankle dorsiflexion    Ankle plantarflexion    Ankle inversion    Ankle eversion     (Blank rows = not  tested)  LOWER EXTREMITY MMT:  MMT Right eval Left eval  Hip flexion 4 5  Hip extension    Hip abduction 4 5  Hip adduction    Hip internal rotation    Hip external rotation    Knee flexion 3+ in available ROM 5  Knee extension 3+ in available ROM 5  Ankle dorsiflexion 4 5  Ankle plantarflexion    Ankle inversion    Ankle eversion     (Blank rows = not tested)  LOWER EXTREMITY SPECIAL TESTS:  N/a  FUNCTIONAL TESTS:  5 times sit to stand: Will assess visit #2 Timed up and go (TUG): Will assess visit #2 10 meter walk test: Will assess visit #2  GAIT: Distance walked: 75 feet Assistive device utilized: Single point cane Level of assistance: SBA Comments: antalgic on right- limited right knee flex and push off   TODAY'S TREATMENT:                                                                                                                              DATE: 11/09/23    Pt performed 5 time sit<>stand (5xSTS): 31.64 sec no UE support  sec (>15 sec indicates increased fall risk)   PT instructed pt in TUG: 18.96 sec with SPC sec (average of 3 trials; >13.5 sec indicates increased fall risk)  10 Meter Walk Test: Patient instructed to walk 10 meters (32.8 ft) as quickly and as safely as possible at their normal speed x2 and at a fast speed x2. Time measured from 2 meter mark to 8 meter mark to accommodate ramp-up and ramp-down.  Normal speed 1: 16.88 m/s Normal speed 2: 17.36 m/s Average Normal speed: 0.58 m/s Cut off scores: <0.4 m/s = household Ambulator, 0.4-0.8 m/s = limited community Ambulator, >0.8 m/s = community Ambulator, >1.2 m/s = crossing a street, <1.0 = increased fall risk MCID 0.05 m/s (small), 0.13 m/s (moderate), 0.06 m/s (significant)  (ANPTA Core Set of Outcome Measures for Adults with Neurologic Conditions, 2018)  Nustep seat 9 (4 min total)  LE only - L1  for Knee ROM  Supine therex:  SAQ with 2 sec hold x 15 bil  Quad set 3 sec hold x 12 RLE   SLR x 8 AROM RLE Isometric  Hip adduction x 12 bil  Hip abduction x 12 bil with manual resistance  Heel slides x 10 RLE   Manual therapy: Supine Grade 2-3 PA/AP joint glides Tibiofemoral x 30sec x 3 in progressive knee flex ROM.from 20 deg of flex up toward 90 deg PROM to right knee flex 1 min into extension x 1 min = measured at 111 deg AROM and 115 deg AAROM today.   Encouraged to ice after PT treatment.      PATIENT EDUCATION:  Education details: Purpose of PT, HEP, Pain management strategies including Ice, ROM, taking meds as prescribed. Pt educated throughout session about proper posture and technique with exercises. Improved exercise technique, movement at target joints, use of target muscles after min to mod verbal, visual, tactile cues.   Person educated: Patient Education method: Explanation, Demonstration, Tactile cues, Verbal cues, and Handouts Education comprehension: verbalized understanding, returned demonstration, verbal cues required, tactile cues required, and needs further education  HOME EXERCISE PROGRAM: Access Code: [email or web address] URL: [email or web address] Date: 10/25/2023 Prepared by: Maureen Ralphs  Exercises - Standing Hip Flexion March  - 1 x daily - 7 x weekly - 3 sets - 10 reps - Standing Hip Abduction with Counter Support  - 1 x daily - 7 x weekly - 3 sets - 10 reps - Standing Hip Extension with Counter Support  - 1 x daily - 7 x weekly - 3 sets - 10 reps - Standing Knee Flexion with Unilateral Counter Support  - 1 x daily - 3 sets - 10 reps - Mini Squat with Counter Support  - 1 x daily - 3 sets - 10 reps - Heel Toe Raises with Unilateral Counter Support  - 1 x daily - 1 x weekly - 3 sets - 10 reps - Toe Raises with Counter Support  - 1 x daily - 1 x weekly - 3 sets - 10 reps    Access Code: [facility] URL: [email or web address] Date: 10/11/2023 Prepared by: Maureen Ralphs  Exercises - Seated Hamstring Stretch  -  2 x daily - 3-5 sets - 30-45 hold - Seated Knee Flexion Stretch  - 2 x daily - 3-5 sets - 30-45 hold  ASSESSMENT:  CLINICAL IMPRESSION: Pt reports decreased pain compared to prior session. Treatment session including completion of standardized outcome measures to allow improved progress and safety with functional tasks and ADLs. See goal assessment for details. Pt demonstrated increased AROM and AAROM into knee flexion and tolerated over pressure for increased terminal knee extension. Unable to ice knee following treatment on this day, so encouraged to perform at home. Patient will benefit from skilled PT services to improve his knee range of motion, LE strength, pain, and all functional mobility to enable him to return to independent walking and previous level of function.    OBJECTIVE IMPAIRMENTS: Abnormal gait, decreased activity tolerance, decreased balance, decreased endurance, decreased mobility, difficulty walking, decreased ROM, decreased strength, hypomobility, impaired flexibility, and pain.   ACTIVITY LIMITATIONS: carrying, lifting, bending, sitting, standing, squatting, sleeping, stairs, transfers, bed mobility, and toileting  PARTICIPATION LIMITATIONS: meal prep, cleaning, laundry, driving, shopping, community activity, occupation, and yard work  PERSONAL FACTORS: 1-2 comorbidities: chronic pain, HTN  are also affecting patient's functional outcome.   REHAB POTENTIAL: Good  CLINICAL DECISION MAKING: Stable/uncomplicated  EVALUATION COMPLEXITY: Low   GOALS: Goals reviewed with patient? Yes  SHORT TERM GOALS: Target date: 11/08/2023 Pt will be independent with HEP in order to decrease ankle pain and increase strength in order to improve pain-free  function at home and work.  Baseline: EVAL- No formal HEP in place Goal status: INITIAL   LONG TERM GOALS: Target date: 12/06/2023  Pt will decrease worst pain as reported on NPRS by at least 3 points in order to demonstrate  clinically significant reduction in ankle/foot pain.  Baseline: EVAL- R knee pain = 7-8/10 Goal status: INITIAL  2.  Pt will improve FOTO to target score of 54 to display perceived improvements in ability to complete ADL's.  Baseline: EVAL= 39 Goal status: INITIAL  3.  Patient will improve Right knee AROM extension to <5 deg from zero for improved ability to straighten his leg with all mobility.  Baseline: EVAL= 12 deg Goal status: INITIAL  4.  Patient will improve Right knee AROM Flexion to > 120 deg for optimal right knee mobility with steps and sitting.  Baseline: EVAL= 90 deg Goal status: INITIAL      5.  Patient (> 40 years old) will complete five times sit to stand test in < 15 seconds indicating an increased LE strength and improved balance. Baseline: 11/14: 31.64 sec no UE support  Goal status: INITIAL     6.   Patient will reduce timed up and go to <11 seconds to reduce fall risk and demonstrate improved transfer/gait ability. Baseline: 11/14 18.96 sec with SPC Goal status: INITIAL  7.   Patient will increase 10 meter walk test to >1.84m/s as to improve gait speed for better community ambulation and to reduce fall risk. Baseline: 11:14 0.30m/s Goal status: INITIAL    PLAN:  PT FREQUENCY: 1-2x/week  PT DURATION: 8 weeks  PLANNED INTERVENTIONS: 97164- PT Re-evaluation, 97110-Therapeutic exercises, 97530- Therapeutic activity, [id number]- Neuromuscular re-education, 97535- Self Care, 16109- Manual therapy, [id number]- Gait training, [phone]- Orthotic Fit/training, [phone]- Electrical stimulation (manual), [phone]- Ultrasound, Patient/Family education, Balance training, Stair training, Taping, Dry Needling, Joint mobilization, Manual lymph drainage, Scar mobilization, Compression bandaging, Cryotherapy, and Moist heat  PLAN FOR NEXT SESSION:    Manual therapy for ROM, TE for strengthening.    Golden Pop, PT 11/09/2023, 8:09 AM

## 2023-11-11 DIAGNOSIS — Z79899 Other long term (current) drug therapy: Secondary | ICD-10-CM | POA: Diagnosis not present

## 2023-11-11 DIAGNOSIS — N3001 Acute cystitis with hematuria: Secondary | ICD-10-CM | POA: Diagnosis not present

## 2023-11-11 DIAGNOSIS — R109 Unspecified abdominal pain: Secondary | ICD-10-CM | POA: Diagnosis not present

## 2023-11-11 DIAGNOSIS — R300 Dysuria: Secondary | ICD-10-CM | POA: Diagnosis not present

## 2023-11-14 ENCOUNTER — Ambulatory Visit: Payer: 59

## 2023-11-16 ENCOUNTER — Ambulatory Visit: Payer: 59 | Admitting: Physical Therapy

## 2023-11-16 ENCOUNTER — Other Ambulatory Visit: Payer: Self-pay | Admitting: Orthopaedic Surgery

## 2023-11-16 ENCOUNTER — Encounter: Payer: Self-pay | Admitting: Orthopaedic Surgery

## 2023-11-16 MED ORDER — HYDROMORPHONE HCL 4 MG PO TABS: 4.0000 mg | ORAL_TABLET | Freq: Four times a day (QID) | ORAL | 0 refills | Status: DC | PRN

## 2023-11-16 MED ORDER — OXYCODONE-ACETAMINOPHEN 5-325 MG PO TABS: 1.0000 | ORAL_TABLET | Freq: Four times a day (QID) | ORAL | 0 refills | Status: DC | PRN

## 2023-11-21 ENCOUNTER — Ambulatory Visit: Payer: 59

## 2023-11-26 DIAGNOSIS — Z419 Encounter for procedure for purposes other than remedying health state, unspecified: Secondary | ICD-10-CM | POA: Diagnosis not present

## 2023-11-28 ENCOUNTER — Ambulatory Visit: Payer: 59 | Attending: Orthopaedic Surgery | Admitting: Physical Therapy

## 2023-11-28 DIAGNOSIS — R262 Difficulty in walking, not elsewhere classified: Secondary | ICD-10-CM | POA: Diagnosis present

## 2023-11-28 DIAGNOSIS — M6281 Muscle weakness (generalized): Secondary | ICD-10-CM | POA: Diagnosis present

## 2023-11-28 DIAGNOSIS — M25661 Stiffness of right knee, not elsewhere classified: Secondary | ICD-10-CM

## 2023-11-28 DIAGNOSIS — M25561 Pain in right knee: Secondary | ICD-10-CM | POA: Diagnosis present

## 2023-11-28 DIAGNOSIS — R269 Unspecified abnormalities of gait and mobility: Secondary | ICD-10-CM

## 2023-11-28 DIAGNOSIS — M2560 Stiffness of unspecified joint, not elsewhere classified: Secondary | ICD-10-CM | POA: Diagnosis present

## 2023-11-28 NOTE — Therapy (Signed)
OUTPATIENT PHYSICAL THERAPY LOWER EXTREMITY TREATMENT   Patient Name: Harold Smith MRN: [id number] DOB:1966/02/08, 57 y.o., male Today's Date: 11/28/2023  END OF SESSION:  PT End of Session - 11/28/23 0802     Visit Number 7    Number of Visits 14    Date for PT Re-Evaluation 12/06/23    Authorization Type [employer] 2024    Authorization - Number of Visits 14    Progress Note Due on Visit 10    PT Start Time 0804    PT Stop Time 0845    PT Time Calculation (min) 41 min    Equipment Utilized During Treatment Gait belt    Activity Tolerance Patient tolerated treatment well;No increased pain    Behavior During Therapy WFL for tasks assessed/performed                Past Medical History:  Diagnosis Date   Allergic rhinitis due to pollen    Anemia    Anxiety    Chronic pain    Colon polyps    Depression    Diverticulitis large intestine    Erosive esophagitis    GERD (gastroesophageal reflux disease)    HTN (hypertension)    Irritable bowel syndrome    Metabolic bone disease 09/01/2022   Obesity    Osteoarthritis, knee    Sleep apnea    does not use cpap, pt states he no longer needed CPAP due to weight loss   Sleep disturbance    Vitamin D deficiency 09/01/2022   Past Surgical History:  Procedure Laterality Date   ABDOMINAL EXPOSURE N/A 03/06/2023   Procedure: ABDOMINAL EXPOSURE;  Surgeon: Cephus Shelling, MD;  Location: [facility] OR;  Service: Vascular;  Laterality: N/A;   ACHILLES TENDON REPAIR Right 12/26/2006   ANTERIOR LUMBAR FUSION N/A 03/06/2023   Procedure: L5-S1 ANTERIOR LUMBAR FUSION 1 LEVEL;  Surgeon: London Sheer, MD;  Location: [facility] OR;  Service: Orthopedics;  Laterality: N/A;   BIOPSY  08/29/2023   Procedure: BIOPSY;  Surgeon: Toney Reil, MD;  Location: [facility] ENDOSCOPY;  Service: Gastroenterology;;   CATARACT EXTRACTION W/PHACO Left 07/20/2021   Procedure: CATARACT EXTRACTION PHACO AND INTRAOCULAR LENS PLACEMENT  (IOC) LEFT 2.11 00:24.0;  Surgeon: Galen Manila, MD;  Location: [facility] SURGERY CNTR;  Service: Ophthalmology;  Laterality: Left;  sleep apnea   CATARACT EXTRACTION W/PHACO Right 08/03/2021   Procedure: CATARACT EXTRACTION PHACO AND INTRAOCULAR LENS PLACEMENT (IOC) RIGHT;  Surgeon: Galen Manila, MD;  Location: [facility];  Service: Ophthalmology;  Laterality: Right;  3.19 0:33.0   CHOLECYSTECTOMY     COLON RESECTION Left 12/26/2004   due to diverticular disease at [facility]   COLONOSCOPY  2013?   COLONOSCOPY N/A 11/30/2022   Procedure: COLONOSCOPY;  Surgeon: Toney Reil, MD;  Location: [facility] ENDOSCOPY;  Service: Gastroenterology;  Laterality: N/A;   COLONOSCOPY WITH PROPOFOL N/A 05/22/2018   Procedure: COLONOSCOPY WITH PROPOFOL;  Surgeon: Toney Reil, MD;  Location: [facility] ENDOSCOPY;  Service: Gastroenterology;  Laterality: N/A;   COLONOSCOPY WITH PROPOFOL N/A 10/22/2020   Procedure: COLONOSCOPY WITH PROPOFOL;  Surgeon: Toney Reil, MD;  Location: [facility];  Service: Endoscopy;  Laterality: N/A;  priority 4   COLONOSCOPY WITH PROPOFOL N/A 11/10/2021   Procedure: COLONOSCOPY WITH PROPOFOL;  Surgeon: Toney Reil, MD;  Location: [facility] ENDOSCOPY;  Service: Gastroenterology;  Laterality: N/A;   COLONOSCOPY WITH PROPOFOL N/A 06/16/2022   Procedure: COLONOSCOPY WITH PROPOFOL;  Surgeon: Toney Reil, MD;  Location:  [facility];  Service: Endoscopy;  Laterality: N/A;   COLONOSCOPY WITH PROPOFOL N/A 11/29/2022   Procedure: COLONOSCOPY WITH PROPOFOL;  Surgeon: Toney Reil, MD;  Location: [facility] ENDOSCOPY;  Service: Gastroenterology;  Laterality: N/A;   DRUG INDUCED ENDOSCOPY N/A 06/10/2019   Procedure: DRUG INDUCED SLEEP ENDOSCOPY;  Surgeon: Osborn Coho, MD;  Location: [facility];  Service: ENT;  Laterality: N/A;   ESOPHAGOGASTRODUODENOSCOPY (EGD) WITH PROPOFOL N/A 06/16/2022   Procedure:  ESOPHAGOGASTRODUODENOSCOPY (EGD) WITH PROPOFOL;  Surgeon: Toney Reil, MD;  Location: [facility] SUR[facility]ERY [facility];  Service: Endoscopy;  Laterality: N/A;   ESOPHAGOGASTRODUODENOSCOPY (EGD) WITH PROPOFOL N/A 05/24/2023   Procedure: ESOPHAGOGASTRODUODENOSCOPY (EGD) WITH PROPOFOL;  Surgeon: Toney Reil, MD;  Location: [facility] ENDOSCOPY;  Service: Gastroenterology;  Laterality: N/A;   ESOPHAGOGASTRODUODENOSCOPY (EGD) WITH PROPOFOL N/A 08/29/2023   Procedure: ESOPHAGOGASTRODUODENOSCOPY (EGD) WITH PROPOFOL;  Surgeon: Toney Reil, MD;  Location: [facility] ENDOSCOPY;  Service: Gastroenterology;  Laterality: N/A;   GASTRIC BYPASS N/A 12/27/2007   HARDWARE REMOVAL Right 06/22/2023   Procedure: HARDWARE REMOVAL;  Surgeon: Myrene Galas, MD;  Location: [facility] OR;  Service: Orthopedics;  Laterality: Right;   HIATAL HERNIA REPAIR  09/25/2014   at [facility]   KNEE ARTHROSCOPY Right 06/22/2023   Procedure: ARTHROSCOPY KNEE;  Surgeon: Myrene Galas, MD;  Location: [facility] OR;  Service: Orthopedics;  Laterality: Right;   ORIF FEMUR FRACTURE Right 08/30/2022   Procedure: OPEN REDUCTION INTERNAL FIXATION (ORIF) DISTAL FEMUR FRACTURE;  Surgeon: Myrene Galas, MD;  Location: [facility] OR;  Service: Orthopedics;  Laterality: Right;   POLYPECTOMY  10/22/2020   Procedure: POLYPECTOMY;  Surgeon: Toney Reil, MD;  Location: [facility];  Service: Endoscopy;;   POLYPECTOMY  06/16/2022   Procedure: POLYPECTOMY;  Surgeon: Toney Reil, MD;  Location: [facility];  Service: Endoscopy;;   ROTATOR CUFF REPAIR Right    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   TOTAL KNEE ARTHROPLASTY Right 09/19/2023   Procedure: RIGHT TOTAL KNEE ARTHROPLASTY;  Surgeon: Kathryne Hitch, MD;  Location: [facility] OR;  Service: Orthopedics;  Laterality: Right;   Patient Active Problem List   Diagnosis Date Noted   Secondary hyperparathyroidism, non-renal (HCC) 10/25/2023   Age-related osteoporosis without current  pathological fracture 10/23/2023   S/P total knee arthroplasty, right 09/19/2023   Status post total right knee replacement 09/19/2023   Chronic pain syndrome 07/12/2023   Anxiety 09/01/2022   Vitamin D deficiency 09/01/2022   Metabolic bone disease  09/01/2022   Displaced supracondylar fracture of distal end of right femur without intracondylar extension (HCC) 08/29/2022   Mood disorder (HCC) 07/08/2022   Disability of walking 06/01/2022   Degeneration of lumbar intervertebral disc 06/01/2022   Iron deficiency anemia 05/10/2022   Lumbar spondylolysis 02/25/2022   Sleep disturbance 07/02/2021   Preventative health care 06/23/2018   Personal hx of gastric bypass 06/23/2018   Erectile dysfunction 06/23/2018   Hx of adenomatous colonic polyps    Essential hypertension 10/17/2013   Diverticulosis large intestine w/o perforation or abscess w/bleeding    Colon polyps     PCP: Dr. Tillman Abide  REFERRING PROVIDER: Dr. Doneen Poisson  REFERRING DIAG: [phone] (ICD-10-CM) - Status post total right knee replacement   THERAPY DIAG:  Muscle weakness (generalized)  Limited joint range of motion (ROM)  Difficulty in walking, not elsewhere classified  Stiffness of right knee, not elsewhere classified  Abnormality of gait and mobility  Acute pain of right knee  Rationale for Evaluation and Treatment:  Rehabilitation  ONSET DATE: 09/19/2023  SUBJECTIVE:   SUBJECTIVE STATEMENT: Pt missed last few scheduled treatment sessions due to UTI with visit to ED and antibiotic course. Pt has finished treatment of UTI and now pain free.  Pt reports knee pain 5-6/10 due to stiffness from cold.  Continues to report clicking in knee and occasional "snapping back" but no other issues.     PERTINENT HISTORY:  Per ortho note from Dr. Magnus Ivan: Ilona Sorrel, 56 y.o. male, has a history of pain and functional disability in the right knee due to trauma and has failed non-surgical  conservative treatments for greater than 12 weeks to includeNSAID's and/or analgesics, corticosteriod injections, use of assistive devices, and activity modification.  Onset of symptoms was abrupt, starting 1 years ago with gradually worsening course since that time. The patient noted prior procedures on the knee to include  ORIF  on the right knee(s).  Per previous admission chart review from Lesli Albee, PT, DPT: Relevant past medical history and comorbidities include s/p hardware removal and arthroscopy of R knee on 06/22/2023 (hardware originally for distal medial condyle fracture);  s/p L5-S1 ANTERIOR LUMBAR FUSION on 03/06/2023; Diverticulosis large intestine w/o perforation or abscess w/bleeding; Essential hypertension; Rectal bleeding; Hx of adenomatous colonic polyps; Personal hx of gastric bypass; Erectile dysfunction; Sleep disturbance; Lumbar spondylolysis; Microcytic anemia; Iron deficiency anemia; Numbness and tingling; Imbalance; Difficulty sleeping; Disability of walking; Pain in joint involving ankle and foot; Degeneration of lumbar intervertebral disc; Erosive esophagitis; Mood disorder (HCC); Displaced supracondylar fracture of distal end of right femur without intracondylar extension (HCC); Anxiety; Vitamin D deficiency; Metabolic bone disease; and Radiculopathy, lumbar region. Please see above for relevant surgery history; osteoporosis, bowel/urinary incontinence, stumbling and dropping things. Patient denies hx of cancer, stroke, seizures, lung problems, heart problems, diabetes, and unexplained weight loss.   PAIN:  Are you having pain? Yes: NPRS scale: 7-8/10 Pain location: Right knee Pain description: ache- sharp Aggravating factors: bending, sitting, standing , walking Relieving factors: rest, meds  PRECAUTIONS: Knee  RED FLAGS: Bowel or bladder incontinence: Yes: bowel     WEIGHT BEARING RESTRICTIONS: No  FALLS:  Has patient fallen in last 6 months? 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  NEXT MD VISIT: Next month- Dr. Magnus Ivan  OBJECTIVE:  Note: Objective measures were completed at Evaluation unless otherwise noted.  DIAGNOSTIC FINDINGS:  Narrative & Impression  CLINICAL DATA:  Status post right knee replacement.   EXAM: PORTABLE RIGHT KNEE - 1-2 VIEW   COMPARISON:  06/22/2023   FINDINGS: Right knee arthroplasty in expected alignment. No periprosthetic lucency or fracture. There has been patellar resurfacing. Recent postsurgical change includes air and edema in the soft tissues and joint space. Chronic ghost tracks in the distal femur. Anterior skin staples in place.   IMPRESSION: Right knee arthroplasty without immediate postoperative complication.     Electronically Signed   By: Narda Rutherford M.D.   On: 09/19/2023 16:43    PATIENT SURVEYS:  FOTO 39 with goal of 54  COGNITION: Overall cognitive status: Within functional limits for tasks assessed     SENSATION: Light touch: Impaired - reported dead zone lateral joint  line  EDEMA:  Circumferential:  1)bottom of incision=38; 2)10 cm above distal incision-=48, 3) top of incision= 49    POSTURE: No Significant postural limitations  PALPATION: Global tenderness along right knee- joint line and posterior knee  LOWER EXTREMITY ROM:  Active ROM Right eval Left eval  Hip flexion    Hip extension    Hip abduction    Hip adduction    Hip internal rotation    Hip external rotation    Knee flexion 90deg   Knee extension Lack 12 deg   Ankle dorsiflexion    Ankle plantarflexion    Ankle inversion    Ankle  eversion     (Blank rows = not tested)  LOWER EXTREMITY MMT:  MMT Right eval Left eval  Hip flexion 4 5  Hip extension    Hip abduction 4 5  Hip adduction    Hip internal rotation    Hip external rotation    Knee flexion 3+ in available ROM 5  Knee extension 3+ in available ROM 5  Ankle dorsiflexion 4 5  Ankle plantarflexion    Ankle inversion    Ankle eversion     (Blank rows = not tested)  LOWER EXTREMITY SPECIAL TESTS:  N/a  FUNCTIONAL TESTS:  5 times sit to stand: Will assess visit #2 Timed up and go (TUG): Will assess visit #2 10 meter walk test: Will assess visit #2  GAIT: Distance walked: 75 feet Assistive device utilized: Single point cane Level of assistance: SBA Comments: antalgic on right- limited right knee flex and push off   TODAY'S TREATMENT:                                                                                                                              DATE: 11/28/23    Octane seat 7 (6 min total)  BLE only - L2-3  for Knee ROM  Standing right knee stretching- knee flexion on 2nd step of stairs x 12 with 3 sec hold.  Seated knee extension/flexion x 15 with 3 sec hold in each direction.  Seated hip abduction x 15 GTB Seated HS curl GTB x 15, 3sec hold  Standing terminal knee extension GTB x 15  Step up on 6inch step x 12 with slow eccentric on RLE.  Supine AAROM increased ROM with   Supine therex:  SAQ with 2 sec hold x 15 bil  Quad set 3 sec hold x 12 RLE  SLR x 8 AROM RLE Isometric Hip adduction x 12 bil  Hip abduction x 12 bil with manual resistance  Heel slides x 10 RLE   Manual therapy: Supine Grade 2-3 PA/AP joint glides Tibiofemoral x 30sec x 3 in progressive knee flex ROM.from 20 deg of flex up toward 110 deg PROM to right knee flex 2 x 20 sec measured at 113 deg AROM and 121 deg AAROM today.   Encouraged to ice after PT treatment.  PATIENT EDUCATION:  Education details: Purpose of PT, HEP, Pain management  strategies including Ice, ROM, taking meds as prescribed. Pt educated throughout session about proper posture and technique with exercises. Improved exercise technique, movement at target joints, use of target muscles after min to mod verbal, visual, tactile cues.   Person educated: Patient Education method: Explanation, Demonstration, Tactile cues, Verbal cues, and Handouts Education comprehension: verbalized understanding, returned demonstration, verbal cues required, tactile cues required, and needs further education  HOME EXERCISE PROGRAM: Access Code: [email or web address] URL: [email or web address] Date: 10/25/2023 Prepared by: Maureen Ralphs  Exercises - Standing Hip Flexion March  - 1 x daily - 7 x weekly - 3 sets - 10 reps - Standing Hip Abduction with Counter Support  - 1 x daily - 7 x weekly - 3 sets - 10 reps - Standing Hip Extension with Counter Support  - 1 x daily - 7 x weekly - 3 sets - 10 reps - Standing Knee Flexion with Unilateral Counter Support  - 1 x daily - 3 sets - 10 reps - Mini Squat with Counter Support  - 1 x daily - 3 sets - 10 reps - Heel Toe Raises with Unilateral Counter Support  - 1 x daily - 1 x weekly - 3 sets - 10 reps - Toe Raises with Counter Support  - 1 x daily - 1 x weekly - 3 sets - 10 reps    Access Code: [facility] URL: [email or web address] Date: 10/11/2023 Prepared by: Maureen Ralphs  Exercises - Seated Hamstring Stretch  - 2 x daily - 3-5 sets - 30-45 hold - Seated Knee Flexion Stretch  - 2 x daily - 3-5 sets - 30-45 hold  ASSESSMENT:  CLINICAL IMPRESSION: Pt reports decreased pain compared to prior session. Pt missed last several sessions but reports completing HEP intermittently. Focused on improved AROM knee flexion and strengthening into extension. Pt demonstrated increased AROM and AAROM into knee flexion and tolerated over pressure for increased terminal knee extension. Patient will benefit from skilled PT  services to improve his knee range of motion, LE strength, pain, and all functional mobility to enable him to return to independent walking and previous level of function.    OBJECTIVE IMPAIRMENTS: Abnormal gait, decreased activity tolerance, decreased balance, decreased endurance, decreased mobility, difficulty walking, decreased ROM, decreased strength, hypomobility, impaired flexibility, and pain.   ACTIVITY LIMITATIONS: carrying, lifting, bending, sitting, standing, squatting, sleeping, stairs, transfers, bed mobility, and toileting  PARTICIPATION LIMITATIONS: meal prep, cleaning, laundry, driving, shopping, community activity, occupation, and yard work  PERSONAL FACTORS: 1-2 comorbidities: chronic pain, HTN  are also affecting patient's functional outcome.   REHAB POTENTIAL: Good  CLINICAL DECISION MAKING: Stable/uncomplicated  EVALUATION COMPLEXITY: Low   GOALS: Goals reviewed with patient? Yes  SHORT TERM GOALS: Target date: 11/08/2023 Pt will be independent with HEP in order to decrease ankle pain and increase strength in order to improve pain-free function at home and work.  Baseline: EVAL- No formal HEP in place Goal status: INITIAL   LONG TERM GOALS: Target date: 12/06/2023  Pt will decrease worst pain as reported on NPRS by at least 3 points in order to demonstrate clinically significant reduction in ankle/foot pain.  Baseline: EVAL- R knee pain = 7-8/10 Goal status: INITIAL  2.  Pt will improve FOTO to target score of 54 to display perceived improvements in ability to complete ADL's.  Baseline: EVAL= 39 Goal status: INITIAL  3.  Patient will improve Right knee AROM extension to <5 deg from  zero for improved ability to straighten his leg with all mobility.  Baseline: EVAL= 12 deg Goal status: INITIAL  4.  Patient will improve Right knee AROM Flexion to > 120 deg for optimal right knee mobility with steps and sitting.  Baseline: EVAL= 90 deg Goal status:  INITIAL      5.  Patient (> 26 years old) will complete five times sit to stand test in < 15 seconds indicating an increased LE strength and improved balance. Baseline: 11/14: 31.64 sec no UE support  Goal status: INITIAL     6.   Patient will reduce timed up and go to <11 seconds to reduce fall risk and demonstrate improved transfer/gait ability. Baseline: 11/14 18.96 sec with SPC Goal status: INITIAL  7.   Patient will increase 10 meter walk test to >1.17m/s as to improve gait speed for better community ambulation and to reduce fall risk. Baseline: 11:14 0.50m/s Goal status: INITIAL    PLAN:  PT FREQUENCY: 1-2x/week  PT DURATION: 8 weeks  PLANNED INTERVENTIONS: 97164- PT Re-evaluation, 97110-Therapeutic exercises, 97530- Therapeutic activity, [id number]- Neuromuscular re-education, 97535- Self Care, 16109- Manual therapy, [id number]- Gait training, [phone]- Orthotic Fit/training, [phone]- Electrical stimulation (manual), [phone]- Ultrasound, Patient/Family education, Balance training, Stair training, Taping, Dry Needling, Joint mobilization, Manual lymph drainage, Scar mobilization, Compression bandaging, Cryotherapy, and Moist heat  PLAN FOR NEXT SESSION:    Manual therapy for ROM, TE for strengthening and knee stability with reduced UE support .    Golden Pop, PT 11/28/2023, 8:03 AM

## 2023-11-29 ENCOUNTER — Encounter: Payer: Self-pay | Admitting: Orthopedic Surgery

## 2023-11-29 ENCOUNTER — Telehealth: Payer: Self-pay | Admitting: Orthopedic Surgery

## 2023-11-29 DIAGNOSIS — M545 Low back pain, unspecified: Secondary | ICD-10-CM

## 2023-11-29 DIAGNOSIS — G8929 Other chronic pain: Secondary | ICD-10-CM

## 2023-11-29 NOTE — Telephone Encounter (Signed)
Orthopedic note   Patient is still having significant low back pain.  He has a thin body habitus and has pain over the area of the posterior instrumentation.  I had told him that most people do not have symptoms from their posterior instrumentation and it is left in place forever.  I also told him that there is a good chance that removing the screws does not help with his back pain because there are a multitude of reasons that people can have back pain.  I have previously covered the risks of surgery with him.  He still wants to proceed with posterior instrumentation removal because his back pain is severe.  His CT scan shows bridging bone across the former L5/S1 disc space. There is no lucency around the screws so I feel he has gone on to successfully fuse. For that reason, I think it would be okay to remove the screws to see if this gives him some improvement in his pain. Will next plan to see him at date of surgery.    London Sheer, MD Orthopedic Surgeon

## 2023-11-30 ENCOUNTER — Ambulatory Visit: Payer: 59 | Admitting: Physical Therapy

## 2023-11-30 DIAGNOSIS — R269 Unspecified abnormalities of gait and mobility: Secondary | ICD-10-CM

## 2023-11-30 DIAGNOSIS — R262 Difficulty in walking, not elsewhere classified: Secondary | ICD-10-CM

## 2023-11-30 DIAGNOSIS — M6281 Muscle weakness (generalized): Secondary | ICD-10-CM | POA: Diagnosis not present

## 2023-11-30 DIAGNOSIS — M25661 Stiffness of right knee, not elsewhere classified: Secondary | ICD-10-CM

## 2023-11-30 DIAGNOSIS — M2560 Stiffness of unspecified joint, not elsewhere classified: Secondary | ICD-10-CM

## 2023-11-30 DIAGNOSIS — M25561 Pain in right knee: Secondary | ICD-10-CM

## 2023-11-30 MED ORDER — HYDROMORPHONE HCL 2 MG PO TABS: 2.0000 mg | ORAL_TABLET | Freq: Four times a day (QID) | ORAL | 0 refills | Status: AC | PRN

## 2023-11-30 NOTE — Therapy (Signed)
OUTPATIENT PHYSICAL THERAPY LOWER EXTREMITY TREATMENT   Patient Name: Harold Smith MRN: [id number] DOB:February 23, 1966, 57 y.o., male Today's Date: 11/30/2023  END OF SESSION:  PT End of Session - 11/30/23 0817     Visit Number 8    Number of Visits 14    Date for PT Re-Evaluation 12/06/23    Authorization Type [employer] 2024    Authorization - Number of Visits 14    Progress Note Due on Visit 10    PT Start Time 0805    PT Stop Time 0845    PT Time Calculation (min) 40 min    Equipment Utilized During Treatment Gait belt    Activity Tolerance Patient tolerated treatment well;No increased pain    Behavior During Therapy WFL for tasks assessed/performed                Past Medical History:  Diagnosis Date   Allergic rhinitis due to pollen    Anemia    Anxiety    Chronic pain    Colon polyps    Depression    Diverticulitis large intestine    Erosive esophagitis    GERD (gastroesophageal reflux disease)    HTN (hypertension)    Irritable bowel syndrome    Metabolic bone disease 09/01/2022   Obesity    Osteoarthritis, knee    Sleep apnea    does not use cpap, pt states he no longer needed CPAP due to weight loss   Sleep disturbance    Vitamin D deficiency 09/01/2022   Past Surgical History:  Procedure Laterality Date   ABDOMINAL EXPOSURE N/A 03/06/2023   Procedure: ABDOMINAL EXPOSURE;  Surgeon: Cephus Shelling, MD;  Location: [facility] OR;  Service: Vascular;  Laterality: N/A;   ACHILLES TENDON REPAIR Right 12/26/2006   ANTERIOR LUMBAR FUSION N/A 03/06/2023   Procedure: L5-S1 ANTERIOR LUMBAR FUSION 1 LEVEL;  Surgeon: London Sheer, MD;  Location: [facility] OR;  Service: Orthopedics;  Laterality: N/A;   BIOPSY  08/29/2023   Procedure: BIOPSY;  Surgeon: Toney Reil, MD;  Location: [facility] ENDOSCOPY;  Service: Gastroenterology;;   CATARACT EXTRACTION W/PHACO Left 07/20/2021   Procedure: CATARACT EXTRACTION PHACO AND INTRAOCULAR LENS PLACEMENT  (IOC) LEFT 2.11 00:24.0;  Surgeon: Galen Manila, MD;  Location: [facility]ERY [facility];  Service: Ophthalmology;  Laterality: Left;  sleep apnea   CATARACT EXTRACTION W/PHACO Right 08/03/2021   Procedure: CATARACT EXTRACTION PHACO AND INTRAOCULAR LENS PLACEMENT (IOC) RIGHT;  Surgeon: Galen Manila, MD;  Location: [facility]ERY [facility];  Service: Ophthalmology;  Laterality: Right;  3.19 0:33.0   CHOLECYSTECTOMY     COLON RESECTION Left 12/26/2004   due to diverticular disease at [facility]   COLONOSCOPY  2013?   COLONOSCOPY N/A 11/30/2022   Procedure: COLONOSCOPY;  Surgeon: Toney Reil, MD;  Location: [facility] ENDOSCOPY;  Service: Gastroenterology;  Laterality: N/A;   COLONOSCOPY WITH PROPOFOL N/A 05/22/2018   Procedure: COLONOSCOPY WITH PROPOFOL;  Surgeon: Toney Reil, MD;  Location: [facility] ENDOSCOPY;  Service: Gastroenterology;  Laterality: N/A;   COLONOSCOPY WITH PROPOFOL N/A 10/22/2020   Procedure: COLONOSCOPY WITH PROPOFOL;  Surgeon: Toney Reil, MD;  Location: [facility]ERY [facility];  Service: Endoscopy;  Laterality: N/A;  priority 4   COLONOSCOPY WITH PROPOFOL N/A 11/10/2021   Procedure: COLONOSCOPY WITH PROPOFOL;  Surgeon: Toney Reil, MD;  Location: [facility] ENDOSCOPY;  Service: Gastroenterology;  Laterality: N/A;   COLONOSCOPY WITH PROPOFOL N/A 06/16/2022   Procedure: COLONOSCOPY WITH PROPOFOL;  Surgeon: Toney Reil, MD;  Location:  [facility];  Service: Endoscopy;  Laterality: N/A;   COLONOSCOPY WITH PROPOFOL N/A 11/29/2022   Procedure: COLONOSCOPY WITH PROPOFOL;  Surgeon: Toney Reil, MD;  Location: [facility] ENDOSCOPY;  Service: Gastroenterology;  Laterality: N/A;   DRUG INDUCED ENDOSCOPY N/A 06/10/2019   Procedure: DRUG INDUCED SLEEP ENDOSCOPY;  Surgeon: Osborn Coho, MD;  Location: [facility];  Service: ENT;  Laterality: N/A;   ESOPHAGOGASTRODUODENOSCOPY (EGD) WITH PROPOFOL N/A 06/16/2022   Procedure:  ESOPHAGOGASTRODUODENOSCOPY (EGD) WITH PROPOFOL;  Surgeon: Toney Reil, MD;  Location: [facility]ERY [facility];  Service: Endoscopy;  Laterality: N/A;   ESOPHAGOGASTRODUODENOSCOPY (EGD) WITH PROPOFOL N/A 05/24/2023   Procedure: ESOPHAGOGASTRODUODENOSCOPY (EGD) WITH PROPOFOL;  Surgeon: Toney Reil, MD;  Location: [facility] ENDOSCOPY;  Service: Gastroenterology;  Laterality: N/A;   ESOPHAGOGASTRODUODENOSCOPY (EGD) WITH PROPOFOL N/A 08/29/2023   Procedure: ESOPHAGOGASTRODUODENOSCOPY (EGD) WITH PROPOFOL;  Surgeon: Toney Reil, MD;  Location: [facility] ENDOSCOPY;  Service: Gastroenterology;  Laterality: N/A;   GASTRIC BYPASS N/A 12/27/2007   HARDWARE REMOVAL Right 06/22/2023   Procedure: HARDWARE REMOVAL;  Surgeon: Myrene Galas, MD;  Location: [facility] OR;  Service: Orthopedics;  Laterality: Right;   HIATAL HERNIA REPAIR  09/25/2014   at [facility]   KNEE ARTHROSCOPY Right 06/22/2023   Procedure: ARTHROSCOPY KNEE;  Surgeon: Myrene Galas, MD;  Location: [facility] OR;  Service: Orthopedics;  Laterality: Right;   ORIF FEMUR FRACTURE Right 08/30/2022   Procedure: OPEN REDUCTION INTERNAL FIXATION (ORIF) DISTAL FEMUR FRACTURE;  Surgeon: Myrene Galas, MD;  Location: [facility] OR;  Service: Orthopedics;  Laterality: Right;   POLYPECTOMY  10/22/2020   Procedure: POLYPECTOMY;  Surgeon: Toney Reil, MD;  Location: [facility]ERY [facility];  Service: Endoscopy;;   POLYPECTOMY  06/16/2022   Procedure: POLYPECTOMY;  Surgeon: Toney Reil, MD;  Location: [facility];  Service: Endoscopy;;   ROTATOR CUFF REPAIR Right    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   TOTAL KNEE ARTHROPLASTY Right 09/19/2023   Procedure: RIGHT TOTAL KNEE ARTHROPLASTY;  Surgeon: Kathryne Hitch, MD;  Location: [facility] OR;  Service: Orthopedics;  Laterality: Right;   Patient Active Problem List   Diagnosis Date Noted   Secondary hyperparathyroidism, non-renal (HCC) 10/25/2023   Age-related osteoporosis without current  pathological fracture 10/23/2023   S/P total knee arthroplasty, right 09/19/2023   Status post total right knee replacement 09/19/2023   Chronic pain syndrome 07/12/2023   Anxiety 09/01/2022   Vitamin D deficiency 09/01/2022   Metabolic bone disease  09/01/2022   Displaced supracondylar fracture of distal end of right femur without intracondylar extension (HCC) 08/29/2022   Mood disorder (HCC) 07/08/2022   Disability of walking 06/01/2022   Degeneration of lumbar intervertebral disc 06/01/2022   Iron deficiency anemia 05/10/2022   Lumbar spondylolysis 02/25/2022   Sleep disturbance 07/02/2021   Preventative health care 06/23/2018   Personal hx of gastric bypass 06/23/2018   Erectile dysfunction 06/23/2018   Hx of adenomatous colonic polyps    Essential hypertension 10/17/2013   Diverticulosis large intestine w/o perforation or abscess w/bleeding    Colon polyps     PCP: Dr. Tillman Abide  REFERRING PROVIDER: Dr. Doneen Poisson  REFERRING DIAG: [phone] (ICD-10-CM) - Status post total right knee replacement   THERAPY DIAG:  Muscle weakness (generalized)  Limited joint range of motion (ROM)  Difficulty in walking, not elsewhere classified  Stiffness of right knee, not elsewhere classified  Abnormality of gait and mobility  Acute pain of right knee  Rationale for Evaluation and Treatment:  Rehabilitation  ONSET DATE: 09/19/2023  SUBJECTIVE:   SUBJECTIVE STATEMENT: Pt missed last few scheduled treatment sessions due to UTI with visit to ED and antibiotic course. Pt has finished treatment of UTI and now pain free.  Pt reports knee pain 5-6/10 due to stiffness from cold.  Continues to report clicking in knee and occasional "snapping back" but no other issues.     PERTINENT HISTORY:  Per ortho note from Dr. Magnus Ivan: Ilona Sorrel, 57 y.o. male, has a history of pain and functional disability in the right knee due to trauma and has failed non-surgical  conservative treatments for greater than 12 weeks to includeNSAID's and/or analgesics, corticosteriod injections, use of assistive devices, and activity modification.  Onset of symptoms was abrupt, starting 1 years ago with gradually worsening course since that time. The patient noted prior procedures on the knee to include  ORIF  on the right knee(s).  Per previous admission chart review from Lesli Albee, PT, DPT: Relevant past medical history and comorbidities include s/p hardware removal and arthroscopy of R knee on 06/22/2023 (hardware originally for distal medial condyle fracture);  s/p L5-S1 ANTERIOR LUMBAR FUSION on 03/06/2023; Diverticulosis large intestine w/o perforation or abscess w/bleeding; Essential hypertension; Rectal bleeding; Hx of adenomatous colonic polyps; Personal hx of gastric bypass; Erectile dysfunction; Sleep disturbance; Lumbar spondylolysis; Microcytic anemia; Iron deficiency anemia; Numbness and tingling; Imbalance; Difficulty sleeping; Disability of walking; Pain in joint involving ankle and foot; Degeneration of lumbar intervertebral disc; Erosive esophagitis; Mood disorder (HCC); Displaced supracondylar fracture of distal end of right femur without intracondylar extension (HCC); Anxiety; Vitamin D deficiency; Metabolic bone disease; and Radiculopathy, lumbar region. Please see above for relevant surgery history; osteoporosis, bowel/urinary incontinence, stumbling and dropping things. Patient denies hx of cancer, stroke, seizures, lung problems, heart problems, diabetes, and unexplained weight loss.   PAIN:  Are you having pain? Yes: NPRS scale: 7-8/10 Pain location: Right knee Pain description: ache- sharp Aggravating factors: bending, sitting, standing , walking Relieving factors: rest, meds  PRECAUTIONS: Knee  RED FLAGS: Bowel or bladder incontinence: Yes: bowel     WEIGHT BEARING RESTRICTIONS: No  FALLS:  Has patient fallen in last 6 months? 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  NEXT MD VISIT: Next month- Dr. Magnus Ivan  OBJECTIVE:  Note: Objective measures were completed at Evaluation unless otherwise noted.  DIAGNOSTIC FINDINGS:  Narrative & Impression  CLINICAL DATA:  Status post right knee replacement.   EXAM: PORTABLE RIGHT KNEE - 1-2 VIEW   COMPARISON:  06/22/2023   FINDINGS: Right knee arthroplasty in expected alignment. No periprosthetic lucency or fracture. There has been patellar resurfacing. Recent postsurgical change includes air and edema in the soft tissues and joint space. Chronic ghost tracks in the distal femur. Anterior skin staples in place.   IMPRESSION: Right knee arthroplasty without immediate postoperative complication.     Electronically Signed   By: Narda Rutherford M.D.   On: 09/19/2023 16:43    PATIENT SURVEYS:  FOTO 39 with goal of 54  COGNITION: Overall cognitive status: Within functional limits for tasks assessed     SENSATION: Light touch: Impaired - reported dead zone lateral joint  line  EDEMA:  Circumferential:  1)bottom of incision=38; 2)10 cm above distal incision-=48, 3) top of incision= 49    POSTURE: No Significant postural limitations  PALPATION: Global tenderness along right knee- joint line and posterior knee  LOWER EXTREMITY ROM:  Active ROM Right eval Left eval  Hip flexion    Hip extension    Hip abduction    Hip adduction    Hip internal rotation    Hip external rotation    Knee flexion 90deg   Knee extension Lack 12 deg   Ankle dorsiflexion    Ankle plantarflexion    Ankle inversion    Ankle  eversion     (Blank rows = not tested)  LOWER EXTREMITY MMT:  MMT Right eval Left eval  Hip flexion 4 5  Hip extension    Hip abduction 4 5  Hip adduction    Hip internal rotation    Hip external rotation    Knee flexion 3+ in available ROM 5  Knee extension 3+ in available ROM 5  Ankle dorsiflexion 4 5  Ankle plantarflexion    Ankle inversion    Ankle eversion     (Blank rows = not tested)  LOWER EXTREMITY SPECIAL TESTS:  N/a  FUNCTIONAL TESTS:  5 times sit to stand: Will assess visit #2 Timed up and go (TUG): Will assess visit #2 10 meter walk test: Will assess visit #2  GAIT: Distance walked: 75 feet Assistive device utilized: Single point cane Level of assistance: SBA Comments: antalgic on right- limited right knee flex and push off   TODAY'S TREATMENT:                                                                                                                              DATE: 11/30/23    Nustep seat 11, arms 11 (5 min total)  BLE only - L1-3  for Knee  AROM  Standing right knee stretching- knee flexion on 10inch step of stairs x 12 with 10 sec hold.  Seated knee terminal extension with foot anchored on floor x 12 with 3 sec hold in each direction.  Lateral step over hurdle x 10 bil  Forward step with BLE only hurdle leading with each LE x 5 bil  Side stepping in parallel bars with RTB at knees x 4 laps, maintaining slight bend in knees.  Heel slides x 12 with 3 sec hold at end range   Prone quad stretch, knee PROM 2 x 30 sec bil '   Encouraged to ice after PT treatment.      PATIENT EDUCATION:  Education details: Purpose of PT, HEP, Pain management strategies including Ice, ROM, taking meds as prescribed. Pt educated throughout session about proper posture and technique with exercises. Improved exercise technique, movement at target joints, use of target muscles after min to mod verbal, visual, tactile cues.   Person educated: Patient Education  method: Explanation, Demonstration, Tactile cues, Verbal  cues, and Handouts Education comprehension: verbalized understanding, returned demonstration, verbal cues required, tactile cues required, and needs further education  HOME EXERCISE PROGRAM: Access Code: [email or web address] URL: [email or web address] Date: 10/25/2023 Prepared by: Maureen Ralphs  Exercises - Standing Hip Flexion March  - 1 x daily - 7 x weekly - 3 sets - 10 reps - Standing Hip Abduction with Counter Support  - 1 x daily - 7 x weekly - 3 sets - 10 reps - Standing Hip Extension with Counter Support  - 1 x daily - 7 x weekly - 3 sets - 10 reps - Standing Knee Flexion with Unilateral Counter Support  - 1 x daily - 3 sets - 10 reps - Mini Squat with Counter Support  - 1 x daily - 3 sets - 10 reps - Heel Toe Raises with Unilateral Counter Support  - 1 x daily - 1 x weekly - 3 sets - 10 reps - Toe Raises with Counter Support  - 1 x daily - 1 x weekly - 3 sets - 10 reps    Access Code: [facility] URL: [email or web address] Date: 10/11/2023 Prepared by: Maureen Ralphs  Exercises - Seated Hamstring Stretch  - 2 x daily - 3-5 sets - 30-45 hold - Seated Knee Flexion Stretch  - 2 x daily - 3-5 sets - 30-45 hold  ASSESSMENT:  CLINICAL IMPRESSION: Pt reports motivated to Participate. PT treatment Focused on improved AROM knee flexion and strengthening into extension and functional movement over obstacles and in various planes of movement. Patient will benefit from skilled PT services to improve his knee range of motion, LE strength, pain, and all functional mobility to enable him to return to independent walking and previous level of function.    OBJECTIVE IMPAIRMENTS: Abnormal gait, decreased activity tolerance, decreased balance, decreased endurance, decreased mobility, difficulty walking, decreased ROM, decreased strength, hypomobility, impaired flexibility, and pain.   ACTIVITY LIMITATIONS:  carrying, lifting, bending, sitting, standing, squatting, sleeping, stairs, transfers, bed mobility, and toileting  PARTICIPATION LIMITATIONS: meal prep, cleaning, laundry, driving, shopping, community activity, occupation, and yard work  PERSONAL FACTORS: 1-2 comorbidities: chronic pain, HTN  are also affecting patient's functional outcome.   REHAB POTENTIAL: Good  CLINICAL DECISION MAKING: Stable/uncomplicated  EVALUATION COMPLEXITY: Low   GOALS: Goals reviewed with patient? Yes  SHORT TERM GOALS: Target date: 11/08/2023 Pt will be independent with HEP in order to decrease ankle pain and increase strength in order to improve pain-free function at home and work.  Baseline: EVAL- No formal HEP in place Goal status: INITIAL   LONG TERM GOALS: Target date: 12/06/2023  Pt will decrease worst pain as reported on NPRS by at least 3 points in order to demonstrate clinically significant reduction in ankle/foot pain.  Baseline: EVAL- R knee pain = 7-8/10 Goal status: INITIAL  2.  Pt will improve FOTO to target score of 54 to display perceived improvements in ability to complete ADL's.  Baseline: EVAL= 39 Goal status: INITIAL  3.  Patient will improve Right knee AROM extension to <5 deg from zero for improved ability to straighten his leg with all mobility.  Baseline: EVAL= 12 deg Goal status: INITIAL  4.  Patient will improve Right knee AROM Flexion to > 120 deg for optimal right knee mobility with steps and sitting.  Baseline: EVAL= 90 deg Goal status: INITIAL      5.  Patient (> 17 years old) will complete five times sit to stand test in < 15 seconds indicating an increased LE strength and  improved balance. Baseline: 11/14: 31.64 sec no UE support  Goal status: INITIAL     6.   Patient will reduce timed up and go to <11 seconds to reduce fall risk and demonstrate improved transfer/gait ability. Baseline: 11/14 18.96 sec with SPC Goal status: INITIAL  7.   Patient will  increase 10 meter walk test to >1.11m/s as to improve gait speed for better community ambulation and to reduce fall risk. Baseline: 11:14 0.61m/s Goal status: INITIAL    PLAN:  PT FREQUENCY: 1-2x/week  PT DURATION: 8 weeks  PLANNED INTERVENTIONS: 97164- PT Re-evaluation, 97110-Therapeutic exercises, 97530- Therapeutic activity, [id number]- Neuromuscular re-education, 97535- Self Care, [id number]- Manual therapy, [id number]- Gait training, [phone]- Orthotic Fit/training, [phone]- Electrical stimulation (manual), [phone]- Ultrasound, Patient/Family education, Balance training, Stair training, Taping, Dry Needling, Joint mobilization, Manual lymph drainage, Scar mobilization, Compression bandaging, Cryotherapy, and Moist heat  PLAN FOR NEXT SESSION:    Manual therapy for ROM, TE for strengthening and knee stability with reduced UE support .    Golden Pop, PT 11/30/2023, 8:18 AM

## 2023-11-30 NOTE — Addendum Note (Signed)
Addended by: Willia Craze on: 11/30/2023 09:34 AM   Modules accepted: Orders

## 2023-11-30 NOTE — Addendum Note (Signed)
Addended by: Willia Craze on: 11/30/2023 09:45 AM   Modules accepted: Orders

## 2023-12-05 ENCOUNTER — Ambulatory Visit: Payer: 59

## 2023-12-05 DIAGNOSIS — M6281 Muscle weakness (generalized): Secondary | ICD-10-CM | POA: Diagnosis not present

## 2023-12-05 DIAGNOSIS — R262 Difficulty in walking, not elsewhere classified: Secondary | ICD-10-CM

## 2023-12-05 DIAGNOSIS — M2560 Stiffness of unspecified joint, not elsewhere classified: Secondary | ICD-10-CM

## 2023-12-05 DIAGNOSIS — M25661 Stiffness of right knee, not elsewhere classified: Secondary | ICD-10-CM

## 2023-12-05 NOTE — Therapy (Signed)
OUTPATIENT PHYSICAL THERAPY TREATMENT   Patient Name: Harold Smith MRN: [id number] DOB:02-24-1966, 57 y.o., male Today's Date: 12/05/2023  END OF SESSION:  PT End of Session - 12/05/23 0811     Visit Number 9    Number of Visits 14    Date for PT Re-Evaluation 12/06/23    Authorization Type [employer] 2024    Authorization - Visit Number 23    Authorization - Number of Visits 30    Progress Note Due on Visit 10    PT Start Time 0805    PT Stop Time 0845    PT Time Calculation (min) 40 min    Activity Tolerance Patient tolerated treatment well;No increased pain    Behavior During Therapy WFL for tasks assessed/performed                Past Medical History:  Diagnosis Date   Allergic rhinitis due to pollen    Anemia    Anxiety    Chronic pain    Colon polyps    Depression    Diverticulitis large intestine    Erosive esophagitis    GERD (gastroesophageal reflux disease)    HTN (hypertension)    Irritable bowel syndrome    Metabolic bone disease 09/01/2022   Obesity    Osteoarthritis, knee    Sleep apnea    does not use cpap, pt states he no longer needed CPAP due to weight loss   Sleep disturbance    Vitamin D deficiency 09/01/2022   Past Surgical History:  Procedure Laterality Date   ABDOMINAL EXPOSURE N/A 03/06/2023   Procedure: ABDOMINAL EXPOSURE;  Surgeon: Cephus Shelling, MD;  Location: [facility] OR;  Service: Vascular;  Laterality: N/A;   ACHILLES TENDON REPAIR Right 12/26/2006   ANTERIOR LUMBAR FUSION N/A 03/06/2023   Procedure: L5-S1 ANTERIOR LUMBAR FUSION 1 LEVEL;  Surgeon: London Sheer, MD;  Location: [facility] OR;  Service: Orthopedics;  Laterality: N/A;   BIOPSY  08/29/2023   Procedure: BIOPSY;  Surgeon: Toney Reil, MD;  Location: [facility] ENDOSCOPY;  Service: Gastroenterology;;   CATARACT EXTRACTION W/PHACO Left 07/20/2021   Procedure: CATARACT EXTRACTION PHACO AND INTRAOCULAR LENS PLACEMENT (IOC) LEFT 2.11 00:24.0;   Surgeon: Galen Manila, MD;  Location: [facility];  Service: Ophthalmology;  Laterality: Left;  sleep apnea   CATARACT EXTRACTION W/PHACO Right 08/03/2021   Procedure: CATARACT EXTRACTION PHACO AND INTRAOCULAR LENS PLACEMENT (IOC) RIGHT;  Surgeon: Galen Manila, MD;  Location: [facility];  Service: Ophthalmology;  Laterality: Right;  3.19 0:33.0   CHOLECYSTECTOMY     COLON RESECTION Left 12/26/2004   due to diverticular disease at [facility]   COLONOSCOPY  2013?   COLONOSCOPY N/A 11/30/2022   Procedure: COLONOSCOPY;  Surgeon: Toney Reil, MD;  Location: [facility] ENDOSCOPY;  Service: Gastroenterology;  Laterality: N/A;   COLONOSCOPY WITH PROPOFOL N/A 05/22/2018   Procedure: COLONOSCOPY WITH PROPOFOL;  Surgeon: Toney Reil, MD;  Location: [facility] ENDOSCOPY;  Service: Gastroenterology;  Laterality: N/A;   COLONOSCOPY WITH PROPOFOL N/A 10/22/2020   Procedure: COLONOSCOPY WITH PROPOFOL;  Surgeon: Toney Reil, MD;  Location: [facility] SURGERY [facility];  Service: Endoscopy;  Laterality: N/A;  priority 4   COLONOSCOPY WITH PROPOFOL N/A 11/10/2021   Procedure: COLONOSCOPY WITH PROPOFOL;  Surgeon: Toney Reil, MD;  Location: [facility] ENDOSCOPY;  Service: Gastroenterology;  Laterality: N/A;   COLONOSCOPY WITH PROPOFOL N/A 06/16/2022   Procedure: COLONOSCOPY WITH PROPOFOL;  Surgeon: Toney Reil, MD;  Location: [facility] SURGERY CNTR;  Service: Endoscopy;  Laterality: N/A;   COLONOSCOPY WITH PROPOFOL N/A 11/29/2022   Procedure: COLONOSCOPY WITH PROPOFOL;  Surgeon: Toney Reil, MD;  Location: [facility] ENDOSCOPY;  Service: Gastroenterology;  Laterality: N/A;   DRUG INDUCED ENDOSCOPY N/A 06/10/2019   Procedure: DRUG INDUCED SLEEP ENDOSCOPY;  Surgeon: Osborn Coho, MD;  Location: [facility];  Service: ENT;  Laterality: N/A;   ESOPHAGOGASTRODUODENOSCOPY (EGD) WITH PROPOFOL N/A 06/16/2022   Procedure: ESOPHAGOGASTRODUODENOSCOPY (EGD) WITH PROPOFOL;   Surgeon: Toney Reil, MD;  Location: [facility] SURGERY CNTR;  Service: Endoscopy;  Laterality: N/A;   ESOPHAGOGASTRODUODENOSCOPY (EGD) WITH PROPOFOL N/A 05/24/2023   Procedure: ESOPHAGOGASTRODUODENOSCOPY (EGD) WITH PROPOFOL;  Surgeon: Toney Reil, MD;  Location: [facility] ENDOSCOPY;  Service: Gastroenterology;  Laterality: N/A;   ESOPHAGOGASTRODUODENOSCOPY (EGD) WITH PROPOFOL N/A 08/29/2023   Procedure: ESOPHAGOGASTRODUODENOSCOPY (EGD) WITH PROPOFOL;  Surgeon: Toney Reil, MD;  Location: [facility] ENDOSCOPY;  Service: Gastroenterology;  Laterality: N/A;   GASTRIC BYPASS N/A 12/27/2007   HARDWARE REMOVAL Right 06/22/2023   Procedure: HARDWARE REMOVAL;  Surgeon: Myrene Galas, MD;  Location: [facility] OR;  Service: Orthopedics;  Laterality: Right;   HIATAL HERNIA REPAIR  09/25/2014   at [facility]   KNEE ARTHROSCOPY Right 06/22/2023   Procedure: ARTHROSCOPY KNEE;  Surgeon: Myrene Galas, MD;  Location: [facility] OR;  Service: Orthopedics;  Laterality: Right;   ORIF FEMUR FRACTURE Right 08/30/2022   Procedure: OPEN REDUCTION INTERNAL FIXATION (ORIF) DISTAL FEMUR FRACTURE;  Surgeon: Myrene Galas, MD;  Location: [facility] OR;  Service: Orthopedics;  Laterality: Right;   POLYPECTOMY  10/22/2020   Procedure: POLYPECTOMY;  Surgeon: Toney Reil, MD;  Location: [facility] SURGERY CNTR;  Service: Endoscopy;;   POLYPECTOMY  06/16/2022   Procedure: POLYPECTOMY;  Surgeon: Toney Reil, MD;  Location: [facility] SURGERY [facility];  Service: Endoscopy;;   ROTATOR CUFF REPAIR Right    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   TOTAL KNEE ARTHROPLASTY Right 09/19/2023   Procedure: RIGHT TOTAL KNEE ARTHROPLASTY;  Surgeon: Kathryne Hitch, MD;  Location: [facility] OR;  Service: Orthopedics;  Laterality: Right;   Patient Active Problem List   Diagnosis Date Noted   Secondary hyperparathyroidism, non-renal (HCC) 10/25/2023   Age-related osteoporosis without current pathological fracture 10/23/2023   S/P total  knee arthroplasty, right 09/19/2023   Status post total right knee replacement 09/19/2023   Chronic pain syndrome 07/12/2023   Anxiety 09/01/2022   Vitamin D deficiency 09/01/2022   Metabolic bone disease  09/01/2022   Displaced supracondylar fracture of distal end of right femur without intracondylar extension (HCC) 08/29/2022   Mood disorder (HCC) 07/08/2022   Disability of walking 06/01/2022   Degeneration of lumbar intervertebral disc 06/01/2022   Iron deficiency anemia 05/10/2022   Lumbar spondylolysis 02/25/2022   Sleep disturbance 07/02/2021   Preventative health care 06/23/2018   Personal hx of gastric bypass 06/23/2018   Erectile dysfunction 06/23/2018   Hx of adenomatous colonic polyps    Essential hypertension 10/17/2013   Diverticulosis large intestine w/o perforation or abscess w/bleeding    Colon polyps     PCP: Dr. Tillman Abide  REFERRING PROVIDER: Dr. Doneen Poisson  REFERRING DIAG: [phone] (ICD-10-CM) - Status post total right knee replacement   THERAPY DIAG:  Muscle weakness (generalized)  Limited joint range of motion (ROM)  Difficulty in walking, not elsewhere classified  Stiffness of right knee, not elsewhere classified  Rationale for Evaluation and Treatment: Rehabilitation  ONSET DATE: 09/19/2023  SUBJECTIVE:   SUBJECTIVE STATEMENT: Pt still working hard at  his HEP at home, ROM. Pt conitnues to have intermittent lateral instability, hyperextension, clicking in knee, but not all the time.     PERTINENT HISTORY:  Per ortho note from Dr. Magnus Ivan: Ilona Sorrel, 57 y.o. male, has a history of pain and functional disability in the right knee due to trauma and has failed non-surgical conservative treatments for greater than 12 weeks to includeNSAID's and/or analgesics, corticosteriod injections, use of assistive devices, and activity modification.  Onset of symptoms was abrupt, starting 1 years ago with gradually worsening course since  that time. The patient noted prior procedures on the knee to include  ORIF  on the right knee(s).  Per previous admission chart review from Lesli Albee, PT, DPT: Relevant past medical history and comorbidities include s/p hardware removal and arthroscopy of R knee on 06/22/2023 (hardware originally for distal medial condyle fracture);  s/p L5-S1 ANTERIOR LUMBAR FUSION on 03/06/2023; Diverticulosis large intestine w/o perforation or abscess w/bleeding; Essential hypertension; Rectal bleeding; Hx of adenomatous colonic polyps; Personal hx of gastric bypass; Erectile dysfunction; Sleep disturbance; Lumbar spondylolysis; Microcytic anemia; Iron deficiency anemia; Numbness and tingling; Imbalance; Difficulty sleeping; Disability of walking; Pain in joint involving ankle and foot; Degeneration of lumbar intervertebral disc; Erosive esophagitis; Mood disorder (HCC); Displaced supracondylar fracture of distal end of right femur without intracondylar extension (HCC); Anxiety; Vitamin D deficiency; Metabolic bone disease; and Radiculopathy, lumbar region. Please see above for relevant surgery history; osteoporosis, bowel/urinary incontinence, stumbling and dropping things. Patient denies hx of cancer, stroke, seizures, lung problems, heart problems, diabetes, and unexplained weight loss.   PAIN:  Are you having pain? Yes 3-4/10 knee  PRECAUTIONS: Knee  RED FLAGS: Bowel or bladder incontinence: Yes: bowel     WEIGHT BEARING RESTRICTIONS: No  FALLS:  Has patient fallen in last 6 months? 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  NEXT MD VISIT: Next month- Dr.  Magnus Ivan  OBJECTIVE:  Note: Objective measures were completed at Evaluation unless otherwise noted.  DIAGNOSTIC FINDINGS:  Narrative & Impression  CLINICAL DATA:  Status post right knee replacement.   EXAM: PORTABLE RIGHT KNEE - 1-2 VIEW   COMPARISON:  06/22/2023   FINDINGS: Right knee arthroplasty in expected alignment. No periprosthetic lucency or fracture. There has been patellar resurfacing. Recent postsurgical change includes air and edema in the soft tissues and joint space. Chronic ghost tracks in the distal femur. Anterior skin staples in place.   IMPRESSION: Right knee arthroplasty without immediate postoperative complication.     Electronically Signed   By: Narda Rutherford M.D.   On: 09/19/2023 16:43    PATIENT SURVEYS:  FOTO 39 with goal of 54  COGNITION: Overall cognitive status: Within functional limits for tasks assessed     SENSATION: Light touch: Impaired - reported dead zone lateral joint line  EDEMA:  Circumferential:  1)bottom of incision=38; 2)10 cm above distal incision-=48, 3) top of incision= 49    POSTURE: No Significant postural limitations  PALPATION: Global tenderness along right knee- joint line and posterior knee  LOWER EXTREMITY ROM:  Active ROM Right eval Left eval  Hip flexion    Hip extension    Hip abduction  Hip adduction    Hip internal rotation    Hip external rotation    Knee flexion 90deg   Knee extension Lack 12 deg   Ankle dorsiflexion    Ankle plantarflexion    Ankle inversion    Ankle eversion     (Blank rows = not tested)    TODAY'S TREATMENT:                                                                                                                              DATE: 12/05/23   -Seat 11 AA/ROM Nustep x4 minutes, level 2, legs only  -Nustep seat 5 10x10sec flexion stretches  -FOTO survey: 58 -ROM assessment: ~10-120 degrees flexion  -10x STS from 19 inches hands free (excellent use of RLE)   -double heel raises x20 -calf stretch on board x45sec -leaning on wall double dorsiflexion x15 (heels 12 inches from wall)  -greenTB tiny side steps 2x bilat  -backward walking x4 c greenTB at thighs  -Right tandem stance x30 (will add to HEP for knee stability training)      PATIENT EDUCATION:  Education details: Purpose of PT, HEP, Pain management strategies including Ice, ROM, taking meds as prescribed. Pt educated throughout session about proper posture and technique with exercises. Improved exercise technique, movement at target joints, use of target muscles after min to mod verbal, visual, tactile cues.   Person educated: Patient Education method: Explanation, Demonstration, Tactile cues, Verbal cues, and Handouts Education comprehension: verbalized understanding, returned demonstration, verbal cues required, tactile cues required, and needs further education  HOME EXERCISE PROGRAM: Access Code: [email or web address] URL: [email or web address] Date: 10/25/2023 Prepared by: Maureen Ralphs  Exercises - Standing Hip Flexion March  - 1 x daily - 7 x weekly - 3 sets - 10 reps - Standing Hip Abduction with Counter Support  - 1 x daily - 7 x weekly - 3 sets - 10 reps - Standing Hip Extension with Counter Support  - 1 x daily - 7 x weekly - 3 sets - 10 reps - Standing Knee Flexion with Unilateral Counter Support  - 1 x daily - 3 sets - 10 reps - Mini Squat with Counter Support  - 1 x daily - 3 sets - 10 reps - Heel Toe Raises with Unilateral Counter Support  - 1 x daily - 1 x weekly - 3 sets - 10 reps - Toe Raises with Counter Support  - 1 x daily - 1 x weekly - 3 sets - 10 reps  Access Code: [facility] URL: [email or web address] Date: 10/11/2023 Prepared by: Maureen Ralphs  Exercises - Seated Hamstring Stretch  - 2 x daily - 3-5 sets - 30-45 hold - Seated Knee Flexion Stretch  - 2 x daily - 3-5 sets - 30-45 hold  ASSESSMENT:  CLINICAL  IMPRESSION: Assessed goals today and discussed timelines for remaining needed progress. Continued with ROM interventions and motor activation. Pt remains very motivated as well as careful with  balance. ROM nearing goal numbers, but remains painful at end range. Other reassessment today shows great progress toward goals overall.   Patient will benefit from skilled PT services to improve his knee range of motion, LE strength, pain, and all functional mobility to enable him to return to independent walking and previous level of function.    OBJECTIVE IMPAIRMENTS: Abnormal gait, decreased activity tolerance, decreased balance, decreased endurance, decreased mobility, difficulty walking, decreased ROM, decreased strength, hypomobility, impaired flexibility, and pain.   ACTIVITY LIMITATIONS: carrying, lifting, bending, sitting, standing, squatting, sleeping, stairs, transfers, bed mobility, and toileting  PARTICIPATION LIMITATIONS: meal prep, cleaning, laundry, driving, shopping, community activity, occupation, and yard work  PERSONAL FACTORS: 1-2 comorbidities: chronic pain, HTN  are also affecting patient's functional outcome.   REHAB POTENTIAL: Good  CLINICAL DECISION MAKING: Stable/uncomplicated  EVALUATION COMPLEXITY: Low   GOALS: Goals reviewed with patient? Yes  SHORT TERM GOALS: Target date: 11/08/2023 Pt will be independent with HEP in order to decrease ankle pain and increase strength in order to improve pain-free function at home and work.  Baseline: EVAL- No formal HEP in place; 12/10: independent  Goal status: ACHIEVED    LONG TERM GOALS: Target date: 12/06/2023  Pt will decrease worst pain as reported on NPRS by at least 3 points in order to demonstrate clinically significant reduction in ankle/foot pain.  Baseline: EVAL- R knee pain = 7-8/10; 12/05/23: 6-7/10 worst pain  Goal status: Ongoing  2.  Pt will improve FOTO to target score of 54 to display perceived improvements in  ability to complete ADL's.  Baseline: EVAL= 39; 12/05/23: 58 Goal status: ACHEIVED  3.  Patient will improve Right knee AROM extension to <5 deg from zero for improved ability to straighten his leg with all mobility.  Baseline: EVAL= 12 deg; 12/10: ~10 degrees but still quite edematous Goal status: Ongoing  4.  Patient will improve Right knee AROM Flexion to > 120 deg for optimal right knee mobility with steps and sitting.  Baseline: EVAL= 90 deg; 12/05/23:  Goal status: Ongoing     5.  Patient (> 71 years old) will complete five times sit to stand test in < 15 seconds indicating an increased LE strength and improved balance. Baseline: 11/14: 31.64 sec no UE support  Goal status: Ongoing     6.   Patient will reduce timed up and go to <11 seconds to reduce fall risk and demonstrate improved transfer/gait ability. Baseline: 11/14 18.96 sec with SPC Goal status: INITIAL  7.   Patient will increase 10 meter walk test to >1.56m/s as to improve gait speed for better community ambulation and to reduce fall risk. Baseline: 11:14 0.9m/s Goal status: INITIAL    PLAN:  PT FREQUENCY: 1-2x/week  PT DURATION: 8 weeks  PLANNED INTERVENTIONS: 97164- PT Re-evaluation, 97110-Therapeutic exercises, 97530- Therapeutic activity, [id number]- Neuromuscular re-education, 97535- Self Care, [id number]- Manual therapy, [id number]- Gait training, [phone]- Orthotic Fit/training, [phone]- Electrical stimulation (manual), [phone]- Ultrasound, Patient/Family education, Balance training, Stair training, Taping, Dry Needling, Joint mobilization, Manual lymph drainage, Scar mobilization, Compression bandaging, Cryotherapy, and Moist heat  PLAN FOR NEXT SESSION:   Manual therapy for ROM, TE for strengthening and knee stability with reduced UE support .    Tabb Croghan C, PT 12/05/2023, 8:24 AM

## 2023-12-07 ENCOUNTER — Ambulatory Visit: Payer: 59

## 2023-12-07 DIAGNOSIS — M6281 Muscle weakness (generalized): Secondary | ICD-10-CM | POA: Diagnosis not present

## 2023-12-07 NOTE — Therapy (Addendum)
OUTPATIENT PHYSICAL THERAPY TREATMENT/PHYSICAL THERAPY PROGRESS NOTE/RE-CERT   Dates of reporting period  10/11/23   to   12/07/23    Patient Name: Harold Smith MRN: [id number] DOB:24-Dec-1966, 57 y.o., male Today's Date: 12/07/2023  END OF SESSION:  PT End of Session - 12/07/23 0808     Visit Number 10    Number of Visits 26   Date for PT Re-Evaluation 01/31/24   Authorization Type [employer] 2024    Authorization - Number of Visits 30    Progress Note Due on Visit 10    PT Start Time 0807    PT Stop Time 0848    PT Time Calculation (min) 41 min    Activity Tolerance Patient tolerated treatment well;No increased pain    Behavior During Therapy WFL for tasks assessed/performed                 Past Medical History:  Diagnosis Date   Allergic rhinitis due to pollen    Anemia    Anxiety    Chronic pain    Colon polyps    Depression    Diverticulitis large intestine    Erosive esophagitis    GERD (gastroesophageal reflux disease)    HTN (hypertension)    Irritable bowel syndrome    Metabolic bone disease 09/01/2022   Obesity    Osteoarthritis, knee    Sleep apnea    does not use cpap, pt states he no longer needed CPAP due to weight loss   Sleep disturbance    Vitamin D deficiency 09/01/2022   Past Surgical History:  Procedure Laterality Date   ABDOMINAL EXPOSURE N/A 03/06/2023   Procedure: ABDOMINAL EXPOSURE;  Surgeon: Cephus Shelling, MD;  Location: [facility] OR;  Service: Vascular;  Laterality: N/A;   ACHILLES TENDON REPAIR Right 12/26/2006   ANTERIOR LUMBAR FUSION N/A 03/06/2023   Procedure: L5-S1 ANTERIOR LUMBAR FUSION 1 LEVEL;  Surgeon: London Sheer, MD;  Location: [facility] OR;  Service: Orthopedics;  Laterality: N/A;   BIOPSY  08/29/2023   Procedure: BIOPSY;  Surgeon: Toney Reil, MD;  Location: [facility] ENDOSCOPY;  Service: Gastroenterology;;   CATARACT EXTRACTION W/PHACO Left 07/20/2021   Procedure: CATARACT EXTRACTION PHACO AND  INTRAOCULAR LENS PLACEMENT (IOC) LEFT 2.11 00:24.0;  Surgeon: Galen Manila, MD;  Location: [facility];  Service: Ophthalmology;  Laterality: Left;  sleep apnea   CATARACT EXTRACTION W/PHACO Right 08/03/2021   Procedure: CATARACT EXTRACTION PHACO AND INTRAOCULAR LENS PLACEMENT (IOC) RIGHT;  Surgeon: Galen Manila, MD;  Location: [facility];  Service: Ophthalmology;  Laterality: Right;  3.19 0:33.0   CHOLECYSTECTOMY     COLON RESECTION Left 12/26/2004   due to diverticular disease at [facility]   COLONOSCOPY  2013?   COLONOSCOPY N/A 11/30/2022   Procedure: COLONOSCOPY;  Surgeon: Toney Reil, MD;  Location: [facility] ENDOSCOPY;  Service: Gastroenterology;  Laterality: N/A;   COLONOSCOPY WITH PROPOFOL N/A 05/22/2018   Procedure: COLONOSCOPY WITH PROPOFOL;  Surgeon: Toney Reil, MD;  Location: [facility] ENDOSCOPY;  Service: Gastroenterology;  Laterality: N/A;   COLONOSCOPY WITH PROPOFOL N/A 10/22/2020   Procedure: COLONOSCOPY WITH PROPOFOL;  Surgeon: Toney Reil, MD;  Location: [facility] SUR[facility]ERY [facility]TR;  Service: Endoscopy;  Laterality: N/A;  priority 4   COLONOSCOPY WITH PROPOFOL N/A 11/10/2021   Procedure: COLONOSCOPY WITH PROPOFOL;  Surgeon: Toney Reil, MD;  Location: [facility] ENDOSCOPY;  Service: Gastroenterology;  Laterality: N/A;   COLONOSCOPY WITH PROPOFOL N/A 06/16/2022   Procedure: COLONOSCOPY WITH PROPOFOL;  Surgeon:  Toney Reil, MD;  Location: [facility] SUR[facility];  Service: Endoscopy;  Laterality: N/A;   COLONOSCOPY WITH PROPOFOL N/A 11/29/2022   Procedure: COLONOSCOPY WITH PROPOFOL;  Surgeon: Toney Reil, MD;  Location: [facility] ENDOSCOPY;  Service: Gastroenterology;  Laterality: N/A;   DRUG INDUCED ENDOSCOPY N/A 06/10/2019   Procedure: DRUG INDUCED SLEEP ENDOSCOPY;  Surgeon: Osborn Coho, MD;  Location: [facility];  Service: ENT;  Laterality: N/A;   ESOPHAGOGASTRODUODENOSCOPY (EGD) WITH PROPOFOL N/A 06/16/2022    Procedure: ESOPHAGOGASTRODUODENOSCOPY (EGD) WITH PROPOFOL;  Surgeon: Toney Reil, MD;  Location: [facility];  Service: Endoscopy;  Laterality: N/A;   ESOPHAGOGASTRODUODENOSCOPY (EGD) WITH PROPOFOL N/A 05/24/2023   Procedure: ESOPHAGOGASTRODUODENOSCOPY (EGD) WITH PROPOFOL;  Surgeon: Toney Reil, MD;  Location: [facility] ENDOSCOPY;  Service: Gastroenterology;  Laterality: N/A;   ESOPHAGOGASTRODUODENOSCOPY (EGD) WITH PROPOFOL N/A 08/29/2023   Procedure: ESOPHAGOGASTRODUODENOSCOPY (EGD) WITH PROPOFOL;  Surgeon: Toney Reil, MD;  Location: [facility] ENDOSCOPY;  Service: Gastroenterology;  Laterality: N/A;   GASTRIC BYPASS N/A 12/27/2007   HARDWARE REMOVAL Right 06/22/2023   Procedure: HARDWARE REMOVAL;  Surgeon: Myrene Galas, MD;  Location: [facility] OR;  Service: Orthopedics;  Laterality: Right;   HIATAL HERNIA REPAIR  09/25/2014   at [facility]   KNEE ARTHROSCOPY Right 06/22/2023   Procedure: ARTHROSCOPY KNEE;  Surgeon: Myrene Galas, MD;  Location: [facility] OR;  Service: Orthopedics;  Laterality: Right;   ORIF FEMUR FRACTURE Right 08/30/2022   Procedure: OPEN REDUCTION INTERNAL FIXATION (ORIF) DISTAL FEMUR FRACTURE;  Surgeon: Myrene Galas, MD;  Location: [facility] OR;  Service: Orthopedics;  Laterality: Right;   POLYPECTOMY  10/22/2020   Procedure: POLYPECTOMY;  Surgeon: Toney Reil, MD;  Location: [facility];  Service: Endoscopy;;   POLYPECTOMY  06/16/2022   Procedure: POLYPECTOMY;  Surgeon: Toney Reil, MD;  Location: [facility];  Service: Endoscopy;;   ROTATOR CUFF REPAIR Right    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   TOTAL KNEE ARTHROPLASTY Right 09/19/2023   Procedure: RIGHT TOTAL KNEE ARTHROPLASTY;  Surgeon: Kathryne Hitch, MD;  Location: [facility] OR;  Service: Orthopedics;  Laterality: Right;   Patient Active Problem List   Diagnosis Date Noted   Secondary hyperparathyroidism, non-renal (HCC) 10/25/2023   Age-related osteoporosis  without current pathological fracture 10/23/2023   S/P total knee arthroplasty, right 09/19/2023   Status post total right knee replacement 09/19/2023   Chronic pain syndrome 07/12/2023   Anxiety 09/01/2022   Vitamin D deficiency 09/01/2022   Metabolic bone disease  09/01/2022   Displaced supracondylar fracture of distal end of right femur without intracondylar extension (HCC) 08/29/2022   Mood disorder (HCC) 07/08/2022   Disability of walking 06/01/2022   Degeneration of lumbar intervertebral disc 06/01/2022   Iron deficiency anemia 05/10/2022   Lumbar spondylolysis 02/25/2022   Sleep disturbance 07/02/2021   Preventative health care 06/23/2018   Personal hx of gastric bypass 06/23/2018   Erectile dysfunction 06/23/2018   Hx of adenomatous colonic polyps    Essential hypertension 10/17/2013   Diverticulosis large intestine w/o perforation or abscess w/bleeding    Colon polyps     PCP: Dr. Tillman Abide  REFERRING PROVIDER: Dr. Doneen Poisson  REFERRING DIAG: [phone] (ICD-10-CM) - Status post total right knee replacement   THERAPY DIAG:  Muscle weakness (generalized)  Rationale for Evaluation and Treatment: Rehabilitation  ONSET DATE: 09/19/2023  SUBJECTIVE:   SUBJECTIVE STATEMENT:  Pt reports he is going to have a back surgery on 12/31 to remove hardware.  Pt notes that the knee was hurting after the last visit.  Pt notes 6-7/10 today in the R knee.   PERTINENT HISTORY:  Per ortho note from Dr. Magnus Ivan: Harold Smith, 57 y.o. male, has a history of pain and functional disability in the right knee due to trauma and has failed non-surgical conservative treatments for greater than 12 weeks to include NSAID's and/or analgesics, corticosteriod injections, use of assistive devices, and activity modification.  Onset of symptoms was abrupt, starting 1 years ago with gradually worsening course since that time. The patient noted prior procedures on the knee to include   ORIF  on the right knee(s).  Per previous admission chart review from Lesli Albee, PT, DPT: Relevant past medical history and comorbidities include s/p hardware removal and arthroscopy of R knee on 06/22/2023 (hardware originally for distal medial condyle fracture);  s/p L5-S1 ANTERIOR LUMBAR FUSION on 03/06/2023; Diverticulosis large intestine w/o perforation or abscess w/bleeding; Essential hypertension; Rectal bleeding; Hx of adenomatous colonic polyps; Personal hx of gastric bypass; Erectile dysfunction; Sleep disturbance; Lumbar spondylolysis; Microcytic anemia; Iron deficiency anemia; Numbness and tingling; Imbalance; Difficulty sleeping; Disability of walking; Pain in joint involving ankle and foot; Degeneration of lumbar intervertebral disc; Erosive esophagitis; Mood disorder (HCC); Displaced supracondylar fracture of distal end of right femur without intracondylar extension (HCC); Anxiety; Vitamin D deficiency; Metabolic bone disease; and Radiculopathy, lumbar region. Please see above for relevant surgery history; osteoporosis, bowel/urinary incontinence, stumbling and dropping things. Patient denies hx of cancer, stroke, seizures, lung problems, heart problems, diabetes, and unexplained weight loss.   PAIN:  Are you having pain? Yes 3-4/10 knee  PRECAUTIONS: Knee  RED FLAGS: Bowel or bladder incontinence: Yes: bowel     WEIGHT BEARING RESTRICTIONS: No  FALLS:  Has patient fallen in last 6 months? 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  NEXT MD VISIT: Next month- Dr. Magnus Ivan  OBJECTIVE:  Note: Objective measures were completed at  Evaluation unless otherwise noted.  DIAGNOSTIC FINDINGS:  Narrative & Impression  CLINICAL DATA:  Status post right knee replacement.   EXAM: PORTABLE RIGHT KNEE - 1-2 VIEW   COMPARISON:  06/22/2023   FINDINGS: Right knee arthroplasty in expected alignment. No periprosthetic lucency or fracture. There has been patellar resurfacing. Recent postsurgical change includes air and edema in the soft tissues and joint space. Chronic ghost tracks in the distal femur. Anterior skin staples in place.   IMPRESSION: Right knee arthroplasty without immediate postoperative complication.     Electronically Signed   By: Narda Rutherford M.D.   On: 09/19/2023 16:43    PATIENT SURVEYS:  FOTO 39 with goal of 54  COGNITION: Overall cognitive status: Within functional limits for tasks assessed     SENSATION: Light touch: Impaired - reported dead zone lateral joint line  EDEMA:  Circumferential:  1)bottom of incision=38; 2)10 cm above distal incision-=48, 3) top of incision= 49    POSTURE: No Significant postural limitations  PALPATION: Global tenderness along right knee- joint line and posterior knee  LOWER EXTREMITY ROM:  Active ROM Right eval Left eval  Hip flexion    Hip extension    Hip abduction  Hip adduction    Hip internal rotation    Hip external rotation    Knee flexion 90deg   Knee extension Lack 12 deg   Ankle dorsiflexion    Ankle plantarflexion    Ankle inversion    Ankle eversion     (Blank rows = not tested)    TODAY'S TREATMENT: DATE: 12/07/23    TherEx:  Supine SLR with half foam roller under foot, 2x10 Supine SAQ with bolster under knee, 2x10 Standing forward lunges at stairs with UR support, 3 sec holds, 2x10 Standing hamstring stretch at stairs with UE support, 30 sec bouts x4   TherAct:  Goal assessment performed and noted below:      PATIENT EDUCATION:  Education details: Purpose of PT, HEP, Pain management strategies  including Ice, ROM, taking meds as prescribed. Pt educated throughout session about proper posture and technique with exercises. Improved exercise technique, movement at target joints, use of target muscles after min to mod verbal, visual, tactile cues.   Person educated: Patient Education method: Explanation, Demonstration, Tactile cues, Verbal cues, and Handouts Education comprehension: verbalized understanding, returned demonstration, verbal cues required, tactile cues required, and needs further education  HOME EXERCISE PROGRAM: Access Code: [email or web address] URL: [email or web address] Date: 10/25/2023 Prepared by: Maureen Ralphs  Exercises - Standing Hip Flexion March  - 1 x daily - 7 x weekly - 3 sets - 10 reps - Standing Hip Abduction with Counter Support  - 1 x daily - 7 x weekly - 3 sets - 10 reps - Standing Hip Extension with Counter Support  - 1 x daily - 7 x weekly - 3 sets - 10 reps - Standing Knee Flexion with Unilateral Counter Support  - 1 x daily - 3 sets - 10 reps - Mini Squat with Counter Support  - 1 x daily - 3 sets - 10 reps - Heel Toe Raises with Unilateral Counter Support  - 1 x daily - 1 x weekly - 3 sets - 10 reps - Toe Raises with Counter Support  - 1 x daily - 1 x weekly - 3 sets - 10 reps  Access Code: [facility] URL: [email or web address] Date: 10/11/2023 Prepared by: Maureen Ralphs  Exercises - Seated Hamstring Stretch  - 2 x daily - 3-5 sets - 30-45 hold - Seated Knee Flexion Stretch  - 2 x daily - 3-5 sets - 30-45 hold  ASSESSMENT:  CLINICAL IMPRESSION:  Pt has made consistent progress towards goals and is demonstrating improved ROM within the R knee at this time.  Pt currently able to meet FOTO goal, however has had a reduction since last assessment due to pain and swelling that he is experiencing at this time.  Pt performed well throughout the exercises as well, noticing a reduction in extension lag when perform SLR.   Patient's condition has the potential to improve in response to therapy. Maximum improvement is yet to be obtained. The anticipated improvement is attainable and reasonable in a generally predictable time.   Pt will continue to benefit from skilled therapy to address remaining deficits in order to improve overall QoL and return to PLOF.     OBJECTIVE IMPAIRMENTS: Abnormal gait, decreased activity tolerance, decreased balance, decreased endurance, decreased mobility, difficulty walking, decreased ROM, decreased strength, hypomobility, impaired flexibility, and pain.   ACTIVITY LIMITATIONS: carrying, lifting, bending, sitting, standing, squatting, sleeping, stairs, transfers, bed mobility, and toileting  PARTICIPATION LIMITATIONS: meal prep, cleaning, laundry, driving, shopping, community activity, occupation, and yard work  PERSONAL FACTORS: 1-2  comorbidities: chronic pain, HTN  are also affecting patient's functional outcome.   REHAB POTENTIAL: Good  CLINICAL DECISION MAKING: Stable/uncomplicated  EVALUATION COMPLEXITY: Low   GOALS: Goals reviewed with patient? Yes  SHORT TERM GOALS: Target date: 11/08/2023 Pt will be independent with HEP in order to decrease ankle pain and increase strength in order to improve pain-free function at home and work.  Baseline: EVAL- No formal HEP in place; 12/10: independent  Goal status: ACHIEVED    LONG TERM GOALS: Target date: 01/31/24  Pt will decrease worst pain as reported on NPRS by at least 3 points in order to demonstrate clinically significant reduction in ankle/foot pain.  Baseline: EVAL- R knee pain = 7-8/10; 12/05/23: 6-7/10 worst pain  12/07/23: 6-7 over past 7 days Goal status: ONGOING  2.  Pt will improve FOTO to target score of 54 to display perceived improvements in ability to complete ADL's.  Baseline: EVAL= 39;  12/05/23: 58 12/07/23: 49 Goal status: MET  3.  Patient will improve Right knee AROM extension to <5 deg from zero  for improved ability to straighten his leg with all mobility.  Baseline: EVAL= 12 deg; 12/10: ~10 degrees but still quite edematous 12/07/23: 3 deg lacking Goal status: ONGOING  4.  Patient will improve Right knee AROM Flexion to > 120 deg for optimal right knee mobility with steps and sitting.  Baseline: EVAL= 90 deg; 12/05/23:  12/07/23: 109 deg Goal status: ONGOING  5.  Patient (> 31 years old) will complete five times sit to stand test in < 15 seconds indicating an increased LE strength and improved balance. Baseline: 11/14: 31.64 sec no UE support  12/07/23: 22.91 sec with no UE Goal status: ONGOING   6.  Patient will reduce timed up and go to <11 seconds to reduce fall risk and demonstrate improved transfer/gait ability. Baseline: 11/14 18.96 sec with SPC 12/07/23: 16.52 sec without AD; 12.76 sec with SPC Goal status: INITIAL  7.  Patient will increase 10 meter walk test to >1.57m/s as to improve gait speed for better community ambulation and to reduce fall risk. Baseline: 11:14 0.43m/s 12/07/23: 0.96 m/s Goal status: ONGOING    PLAN:  PT FREQUENCY: 1-2x/week  PT DURATION: 8 weeks  PLANNED INTERVENTIONS: 97164- PT Re-evaluation, 97110-Therapeutic exercises, 97530- Therapeutic activity, 97112- Neuromuscular re-education, 97535- Self Care, 16109- Manual therapy, [id number]- Gait training, [phone]- Orthotic Fit/training, [phone]- Electrical stimulation (manual), [phone]- Ultrasound, Patient/Family education, Balance training, Stair training, Taping, Dry Needling, Joint mobilization, Manual lymph drainage, Scar mobilization, Compression bandaging, Cryotherapy, and Moist heat  PLAN FOR NEXT SESSION:   Manual therapy for ROM, TE for strengthening and knee stability with reduced UE support .   Nolon Bussing, PT, DPT Physical Therapist - [facility]  12/07/23, 3:09 PM

## 2023-12-08 ENCOUNTER — Telehealth: Payer: Self-pay | Admitting: Orthopedic Surgery

## 2023-12-08 DIAGNOSIS — G8929 Other chronic pain: Secondary | ICD-10-CM

## 2023-12-08 NOTE — Telephone Encounter (Signed)
Orthopedic Surgery Telephone Note  I called the patient this afternoon and spoke to him on the telephone.  I informed him that his insurance company, [employer], has denied his planned surgery.  I told him that, per their policy, he needs to 6 weeks of PT before they would consider this surgery medically necessary in spite of the fact that he is taking narcotics to control his pain.  I put in a referral to PT.  I told him to see me in 6 weeks time to check in.  If he is still having significant pain, will place him back on the OR schedule.  London Sheer, MD Orthopedic Surgeon

## 2023-12-12 ENCOUNTER — Ambulatory Visit: Payer: 59

## 2023-12-12 DIAGNOSIS — M2560 Stiffness of unspecified joint, not elsewhere classified: Secondary | ICD-10-CM

## 2023-12-12 DIAGNOSIS — M25661 Stiffness of right knee, not elsewhere classified: Secondary | ICD-10-CM

## 2023-12-12 DIAGNOSIS — R262 Difficulty in walking, not elsewhere classified: Secondary | ICD-10-CM

## 2023-12-12 DIAGNOSIS — M6281 Muscle weakness (generalized): Secondary | ICD-10-CM | POA: Diagnosis not present

## 2023-12-12 DIAGNOSIS — R269 Unspecified abnormalities of gait and mobility: Secondary | ICD-10-CM

## 2023-12-12 DIAGNOSIS — M25561 Pain in right knee: Secondary | ICD-10-CM

## 2023-12-12 NOTE — Therapy (Signed)
OUTPATIENT PHYSICAL THERAPY TREATMENT   Patient Name: Harold Smith MRN: [id number] DOB:[birth year], 57 y.o., male Today's Date: 12/12/2023  END OF SESSION:  PT End of Session - 12/12/23 1433     Visit Number 11    Number of Visits 26    Date for PT Re-Evaluation 01/31/24    Authorization Type [employer] 2024    Authorization - Number of Visits 30    Progress Note Due on Visit 10    PT Start Time 1104    PT Stop Time 1145    PT Time Calculation (min) 41 min    Equipment Utilized During Treatment Gait belt    Activity Tolerance Patient tolerated treatment well;No increased pain    Behavior During Therapy WFL for tasks assessed/performed                  Past Medical History:  Diagnosis Date   Allergic rhinitis due to pollen    Anemia    Anxiety    Chronic pain    Colon polyps    Depression    Diverticulitis large intestine    Erosive esophagitis    GERD (gastroesophageal reflux disease)    HTN (hypertension)    Irritable bowel syndrome    Metabolic bone disease 09/01/2022   Obesity    Osteoarthritis, knee    Sleep apnea    does not use cpap, pt states he no longer needed CPAP due to weight loss   Sleep disturbance    Vitamin D deficiency 09/01/2022   Past Surgical History:  Procedure Laterality Date   ABDOMINAL EXPOSURE N/A 03/06/2023   Procedure: ABDOMINAL EXPOSURE;  Surgeon: Cephus Shelling, MD;  Location: [facility] OR;  Service: Vascular;  Laterality: N/A;   ACHILLES TENDON REPAIR Right 12/26/2006   ANTERIOR LUMBAR FUSION N/A 03/06/2023   Procedure: L5-S1 ANTERIOR LUMBAR FUSION 1 LEVEL;  Surgeon: London Sheer, MD;  Location: [facility] OR;  Service: Orthopedics;  Laterality: N/A;   BIOPSY  08/29/2023   Procedure: BIOPSY;  Surgeon: Toney Reil, MD;  Location: [facility] ENDOSCOPY;  Service: Gastroenterology;;   CATARACT EXTRACTION W/PHACO Left 07/20/2021   Procedure: CATARACT EXTRACTION PHACO AND INTRAOCULAR LENS PLACEMENT (IOC) LEFT  2.11 00:24.0;  Surgeon: Galen Manila, MD;  Location: [facility];  Service: Ophthalmology;  Laterality: Left;  sleep apnea   CATARACT EXTRACTION W/PHACO Right 08/03/2021   Procedure: CATARACT EXTRACTION PHACO AND INTRAOCULAR LENS PLACEMENT (IOC) RIGHT;  Surgeon: Galen Manila, MD;  Location: [facility];  Service: Ophthalmology;  Laterality: Right;  3.19 0:33.0   CHOLECYSTECTOMY     COLON RESECTION Left 12/26/2004   due to diverticular disease at [facility]   COLONOSCOPY  2013?   COLONOSCOPY N/A 11/30/2022   Procedure: COLONOSCOPY;  Surgeon: Toney Reil, MD;  Location: [facility] ENDOSCOPY;  Service: Gastroenterology;  Laterality: N/A;   COLONOSCOPY WITH PROPOFOL N/A 05/22/2018   Procedure: COLONOSCOPY WITH PROPOFOL;  Surgeon: Toney Reil, MD;  Location: [facility] ENDOSCOPY;  Service: Gastroenterology;  Laterality: N/A;   COLONOSCOPY WITH PROPOFOL N/A 10/22/2020   Procedure: COLONOSCOPY WITH PROPOFOL;  Surgeon: Toney Reil, MD;  Location: [facility] SUR[facility]ERY [facility];  Service: Endoscopy;  Laterality: N/A;  priority 4   COLONOSCOPY WITH PROPOFOL N/A 11/10/2021   Procedure: COLONOSCOPY WITH PROPOFOL;  Surgeon: Toney Reil, MD;  Location: [facility] ENDOSCOPY;  Service: Gastroenterology;  Laterality: N/A;   COLONOSCOPY WITH PROPOFOL N/A 06/16/2022   Procedure: COLONOSCOPY WITH PROPOFOL;  Surgeon: Toney Reil, MD;  Location:  [facility];  Service: Endoscopy;  Laterality: N/A;   COLONOSCOPY WITH PROPOFOL N/A 11/29/2022   Procedure: COLONOSCOPY WITH PROPOFOL;  Surgeon: Toney Reil, MD;  Location: [facility] ENDOSCOPY;  Service: Gastroenterology;  Laterality: N/A;   DRUG INDUCED ENDOSCOPY N/A 06/10/2019   Procedure: DRUG INDUCED SLEEP ENDOSCOPY;  Surgeon: Osborn Coho, MD;  Location: [facility];  Service: ENT;  Laterality: N/A;   ESOPHAGOGASTRODUODENOSCOPY (EGD) WITH PROPOFOL N/A 06/16/2022   Procedure: ESOPHAGOGASTRODUODENOSCOPY (EGD)  WITH PROPOFOL;  Surgeon: Toney Reil, MD;  Location: [facility] SURGERY [facility];  Service: Endoscopy;  Laterality: N/A;   ESOPHAGOGASTRODUODENOSCOPY (EGD) WITH PROPOFOL N/A 05/24/2023   Procedure: ESOPHAGOGASTRODUODENOSCOPY (EGD) WITH PROPOFOL;  Surgeon: Toney Reil, MD;  Location: [facility] ENDOSCOPY;  Service: Gastroenterology;  Laterality: N/A;   ESOPHAGOGASTRODUODENOSCOPY (EGD) WITH PROPOFOL N/A 08/29/2023   Procedure: ESOPHAGOGASTRODUODENOSCOPY (EGD) WITH PROPOFOL;  Surgeon: Toney Reil, MD;  Location: [facility] ENDOSCOPY;  Service: Gastroenterology;  Laterality: N/A;   GASTRIC BYPASS N/A 12/27/2007   HARDWARE REMOVAL Right 06/22/2023   Procedure: HARDWARE REMOVAL;  Surgeon: Myrene Galas, MD;  Location: [facility] OR;  Service: Orthopedics;  Laterality: Right;   HIATAL HERNIA REPAIR  09/25/2014   at [facility]   KNEE ARTHROSCOPY Right 06/22/2023   Procedure: ARTHROSCOPY KNEE;  Surgeon: Myrene Galas, MD;  Location: [facility] OR;  Service: Orthopedics;  Laterality: Right;   ORIF FEMUR FRACTURE Right 08/30/2022   Procedure: OPEN REDUCTION INTERNAL FIXATION (ORIF) DISTAL FEMUR FRACTURE;  Surgeon: Myrene Galas, MD;  Location: [facility] OR;  Service: Orthopedics;  Laterality: Right;   POLYPECTOMY  10/22/2020   Procedure: POLYPECTOMY;  Surgeon: Toney Reil, MD;  Location: [facility];  Service: Endoscopy;;   POLYPECTOMY  06/16/2022   Procedure: POLYPECTOMY;  Surgeon: Toney Reil, MD;  Location: [facility];  Service: Endoscopy;;   ROTATOR CUFF REPAIR Right    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   TOTAL KNEE ARTHROPLASTY Right 09/19/2023   Procedure: RIGHT TOTAL KNEE ARTHROPLASTY;  Surgeon: Kathryne Hitch, MD;  Location: [facility] OR;  Service: Orthopedics;  Laterality: Right;   Patient Active Problem List   Diagnosis Date Noted   Secondary hyperparathyroidism, non-renal (HCC) 10/25/2023   Age-related osteoporosis without current pathological fracture 10/23/2023    S/P total knee arthroplasty, right 09/19/2023   Status post total right knee replacement 09/19/2023   Chronic pain syndrome 07/12/2023   Anxiety 09/01/2022   Vitamin D deficiency 09/01/2022   Metabolic bone disease  09/01/2022   Displaced supracondylar fracture of distal end of right femur without intracondylar extension (HCC) 08/29/2022   Mood disorder (HCC) 07/08/2022   Disability of walking 06/01/2022   Degeneration of lumbar intervertebral disc 06/01/2022   Iron deficiency anemia 05/10/2022   Lumbar spondylolysis 02/25/2022   Sleep disturbance 07/02/2021   Preventative health care 06/23/2018   Personal hx of gastric bypass 06/23/2018   Erectile dysfunction 06/23/2018   Hx of adenomatous colonic polyps    Essential hypertension 10/17/2013   Diverticulosis large intestine w/o perforation or abscess w/bleeding    Colon polyps     PCP: Dr. Tillman Abide  REFERRING PROVIDER: Dr. Doneen Poisson  REFERRING DIAG: [phone] (ICD-10-CM) - Status post total right knee replacement   THERAPY DIAG:  Muscle weakness (generalized)  Limited joint range of motion (ROM)  Difficulty in walking, not elsewhere classified  Stiffness of right knee, not elsewhere classified  Abnormality of gait and mobility  Acute pain of right knee  Rationale for Evaluation and Treatment:  Rehabilitation  ONSET DATE: 09/19/2023  SUBJECTIVE:   SUBJECTIVE STATEMENT:  Pt reports he talked to his MD and going to push back his upcoming back surgery to remove hardware until sometime early 2025. He states doing better over with less knee swelling and pain- 6/10 today in the R knee.   PERTINENT HISTORY:  Per ortho note from Dr. Magnus Ivan: Ilona Sorrel, 57 y.o. male, has a history of pain and functional disability in the right knee due to trauma and has failed non-surgical conservative treatments for greater than 12 weeks to include NSAID's and/or analgesics, corticosteriod injections, use of  assistive devices, and activity modification.  Onset of symptoms was abrupt, starting 1 years ago with gradually worsening course since that time. The patient noted prior procedures on the knee to include  ORIF  on the right knee(s).  Per previous admission chart review from Lesli Albee, PT, DPT: Relevant past medical history and comorbidities include s/p hardware removal and arthroscopy of R knee on 06/22/2023 (hardware originally for distal medial condyle fracture);  s/p L5-S1 ANTERIOR LUMBAR FUSION on 03/06/2023; Diverticulosis large intestine w/o perforation or abscess w/bleeding; Essential hypertension; Rectal bleeding; Hx of adenomatous colonic polyps; Personal hx of gastric bypass; Erectile dysfunction; Sleep disturbance; Lumbar spondylolysis; Microcytic anemia; Iron deficiency anemia; Numbness and tingling; Imbalance; Difficulty sleeping; Disability of walking; Pain in joint involving ankle and foot; Degeneration of lumbar intervertebral disc; Erosive esophagitis; Mood disorder (HCC); Displaced supracondylar fracture of distal end of right femur without intracondylar extension (HCC); Anxiety; Vitamin D deficiency; Metabolic bone disease; and Radiculopathy, lumbar region. Please see above for relevant surgery history; osteoporosis, bowel/urinary incontinence, stumbling and dropping things. Patient denies hx of cancer, stroke, seizures, lung problems, heart problems, diabetes, and unexplained weight loss.   PAIN:  Are you having pain? Yes 3-4/10 knee  PRECAUTIONS: Knee  RED FLAGS: Bowel or bladder incontinence: Yes: bowel     WEIGHT BEARING RESTRICTIONS: No  FALLS:  Has patient fallen in last 6 months? 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  NEXT MD VISIT: Next month- Dr. Magnus Ivan  OBJECTIVE:  Note: Objective measures were completed at Evaluation unless otherwise noted.  DIAGNOSTIC FINDINGS:  Narrative & Impression  CLINICAL DATA:  Status post right knee replacement.   EXAM: PORTABLE RIGHT KNEE - 1-2 VIEW   COMPARISON:  06/22/2023   FINDINGS: Right knee arthroplasty in expected alignment. No periprosthetic lucency or fracture. There has been patellar resurfacing. Recent postsurgical change includes air and edema in the soft tissues and joint space. Chronic ghost tracks in the distal femur. Anterior skin staples in place.   IMPRESSION: Right knee arthroplasty without immediate postoperative complication.     Electronically Signed   By: Narda Rutherford M.D.   On: 09/19/2023 16:43    PATIENT SURVEYS:  FOTO 39 with goal of 54  COGNITION: Overall cognitive status: Within functional limits for tasks assessed     SENSATION: Light touch: Impaired - reported dead zone lateral joint line  EDEMA:  Circumferential:  1)bottom of incision=38; 2)10 cm above distal incision-=48, 3) top of incision= 49    POSTURE: No Significant postural limitations  PALPATION: Global tenderness along right  knee- joint line and posterior knee  LOWER EXTREMITY ROM:  Active ROM Right eval Left eval  Hip flexion    Hip extension    Hip abduction    Hip adduction    Hip internal rotation    Hip external rotation    Knee flexion 90deg   Knee extension Lack 12 deg   Ankle dorsiflexion    Ankle plantarflexion    Ankle inversion    Ankle eversion     (Blank rows = not tested)    TODAY'S TREATMENT: DATE: 12/12/23    TherEx: Active knee stretching into flex 3 x 30 sec hold and hamstring stretch x 30 sec x 3 RLE Supine Hip swing- lateral to medial and back up/over cone - 3 sets x 10 reps Seated ham  curl GTB 3 sets of 10 reps Supine heel slides x 12 reps  Standing step up onto 6" block without UE support,  x10 Standing Lateral Step ups onto 6" block without UE support x 10 reps  Standing calf raises from incline with 2 sec hold - 2 sets of 10 reps Single leg standing - multiple attempts- at best 18 sec on left and 14 sec on RLE.   Manual:   Passive R knee ROM in sitting and later in supine Grade 2-3 tibiofemoral mobs PA/AP in sitting and later in supine with varying knee flex ROM.  Measured Right knee AROM (Supine) = 119 deg after all therex and Manual stretching.       PATIENT EDUCATION:  Education details: Purpose of PT, HEP, Pain management strategies including Ice, ROM, taking meds as prescribed. Pt educated throughout session about proper posture and technique with exercises. Improved exercise technique, movement at target joints, use of target muscles after min to mod verbal, visual, tactile cues.   Person educated: Patient Education method: Explanation, Demonstration, Tactile cues, Verbal cues, and Handouts Education comprehension: verbalized understanding, returned demonstration, verbal cues required, tactile cues required, and needs further education  HOME EXERCISE PROGRAM: Access Code: [email or web address] URL: [email or web address] Date: 10/25/2023 Prepared by: Maureen Ralphs  Exercises - Standing Hip Flexion March  - 1 x daily - 7 x weekly - 3 sets - 10 reps - Standing Hip Abduction with Counter Support  - 1 x daily - 7 x weekly - 3 sets - 10 reps - Standing Hip Extension with Counter Support  - 1 x daily - 7 x weekly - 3 sets - 10 reps - Standing Knee Flexion with Unilateral Counter Support  - 1 x daily - 3 sets - 10 reps - Mini Squat with Counter Support  - 1 x daily - 3 sets - 10 reps - Heel Toe Raises with Unilateral Counter Support  - 1 x daily - 1 x weekly - 3 sets - 10 reps - Toe Raises with Counter Support  - 1 x daily - 1 x weekly - 3 sets - 10  reps  Access Code: [facility] URL: [email or web address] Date: 10/11/2023 Prepared by: Maureen Ralphs  Exercises - Seated Hamstring Stretch  - 2 x daily - 3-5 sets - 30-45 hold - Seated Knee Flexion Stretch  - 2 x daily - 3-5 sets - 30-45 hold  ASSESSMENT:  CLINICAL IMPRESSION:  Patient continues to progress well exhibiting much improved ROM and very minimal soreness pain with all activities. He continues to present with improving swelling and no significant pain with ROM/strengthening. He was abel to demo good static standing time and will benefit from continued manual therapy for  ROM and progressive therex for improved strengthening.  Pt will continue to benefit from skilled therapy to address remaining deficits in order to improve overall QoL and return to PLOF.     OBJECTIVE IMPAIRMENTS: Abnormal gait, decreased activity tolerance, decreased balance, decreased endurance, decreased mobility, difficulty walking, decreased ROM, decreased strength, hypomobility, impaired flexibility, and pain.   ACTIVITY LIMITATIONS: carrying, lifting, bending, sitting, standing, squatting, sleeping, stairs, transfers, bed mobility, and toileting  PARTICIPATION LIMITATIONS: meal prep, cleaning, laundry, driving, shopping, community activity, occupation, and yard work  PERSONAL FACTORS: 1-2 comorbidities: chronic pain, HTN  are also affecting patient's functional outcome.   REHAB POTENTIAL: Good  CLINICAL DECISION MAKING: Stable/uncomplicated  EVALUATION COMPLEXITY: Low   GOALS: Goals reviewed with patient? Yes  SHORT TERM GOALS: Target date: 11/08/2023 Pt will be independent with HEP in order to decrease ankle pain and increase strength in order to improve pain-free function at home and work.  Baseline: EVAL- No formal HEP in place; 12/10: independent  Goal status: ACHIEVED    LONG TERM GOALS: Target date: 12/06/2023  Pt will decrease worst pain as reported on NPRS by at  least 3 points in order to demonstrate clinically significant reduction in ankle/foot pain.  Baseline: EVAL- R knee pain = 7-8/10; 12/05/23: 6-7/10 worst pain  12/07/23: 6-7 over past 7 days Goal status: ONGOING  2.  Pt will improve FOTO to target score of 54 to display perceived improvements in ability to complete ADL's.  Baseline: EVAL= 39;  12/05/23: 58 12/07/23: 49 Goal status: MET  3.  Patient will improve Right knee AROM extension to <5 deg from zero for improved ability to straighten his leg with all mobility.  Baseline: EVAL= 12 deg; 12/10: ~10 degrees but still quite edematous 12/07/23: 3 deg lacking Goal status: ONGOING  4.  Patient will improve Right knee AROM Flexion to > 120 deg for optimal right knee mobility with steps and sitting.  Baseline: EVAL= 90 deg; 12/05/23:  12/07/23: 109 deg Goal status: ONGOING  5.  Patient (> 11 years old) will complete five times sit to stand test in < 15 seconds indicating an increased LE strength and improved balance. Baseline: 11/14: 31.64 sec no UE support  12/07/23: 22.91 sec with no UE Goal status: ONGOING   6.  Patient will reduce timed up and go to <11 seconds to reduce fall risk and demonstrate improved transfer/gait ability. Baseline: 11/14 18.96 sec with SPC 12/07/23: 16.52 sec without AD; 12.76 sec with SPC Goal status: INITIAL  7.  Patient will increase 10 meter walk test to >1.69m/s as to improve gait speed for better community ambulation and to reduce fall risk. Baseline: 11:14 0.76m/s 12/07/23: 0.96 m/s Goal status: ONGOING    PLAN:  PT FREQUENCY: 1-2x/week  PT DURATION: 8 weeks  PLANNED INTERVENTIONS: 97164- PT Re-evaluation, 97110-Therapeutic exercises, 97530- Therapeutic activity, 97112- Neuromuscular re-education, 97535- Self Care, 27253- Manual therapy, [id number]- Gait training, [phone]- Orthotic Fit/training, [phone]- Electrical stimulation (manual), [phone]- Ultrasound, Patient/Family education, Balance training,  Stair training, Taping, Dry Needling, Joint mobilization, Manual lymph drainage, Scar mobilization, Compression bandaging, Cryotherapy, and Moist heat  PLAN FOR NEXT SESSION:   Manual therapy for ROM, TE for strengthening and knee stability with reduced UE support .   Louis Meckel, PT Physical Therapist - [facility]  12/12/23, 2:34 PM

## 2023-12-12 NOTE — Addendum Note (Signed)
Addended by: Phineas Real on: 12/12/2023 11:32 AM   Modules accepted: Orders

## 2023-12-13 ENCOUNTER — Encounter: Payer: Self-pay | Admitting: Physical Medicine & Rehabilitation

## 2023-12-14 ENCOUNTER — Ambulatory Visit: Payer: 59

## 2023-12-14 DIAGNOSIS — R269 Unspecified abnormalities of gait and mobility: Secondary | ICD-10-CM

## 2023-12-14 DIAGNOSIS — M6281 Muscle weakness (generalized): Secondary | ICD-10-CM | POA: Diagnosis not present

## 2023-12-14 DIAGNOSIS — M2560 Stiffness of unspecified joint, not elsewhere classified: Secondary | ICD-10-CM

## 2023-12-14 DIAGNOSIS — M25561 Pain in right knee: Secondary | ICD-10-CM

## 2023-12-14 DIAGNOSIS — R262 Difficulty in walking, not elsewhere classified: Secondary | ICD-10-CM

## 2023-12-14 DIAGNOSIS — M25661 Stiffness of right knee, not elsewhere classified: Secondary | ICD-10-CM

## 2023-12-14 NOTE — Therapy (Signed)
OUTPATIENT PHYSICAL THERAPY TREATMENT   Patient Name: Arta Baz MRN: [id number] DOB:[birth year], 57 y.o., male Today's Date: 12/15/2023  END OF SESSION:  PT End of Session - 12/14/23 0936     Visit Number 12    Number of Visits 26    Date for PT Re-Evaluation 01/31/24    Authorization Type [employer] 2024    Authorization - Number of Visits 30    Progress Note Due on Visit 20    PT Start Time 0931    PT Stop Time 1014    PT Time Calculation (min) 43 min    Equipment Utilized During Treatment Gait belt    Activity Tolerance Patient tolerated treatment well;No increased pain    Behavior During Therapy WFL for tasks assessed/performed                  Past Medical History:  Diagnosis Date   Allergic rhinitis due to pollen    Anemia    Anxiety    Chronic pain    Colon polyps    Depression    Diverticulitis large intestine    Erosive esophagitis    GERD (gastroesophageal reflux disease)    HTN (hypertension)    Irritable bowel syndrome    Metabolic bone disease 09/01/2022   Obesity    Osteoarthritis, knee    Sleep apnea    does not use cpap, pt states he no longer needed CPAP due to weight loss   Sleep disturbance    Vitamin D deficiency 09/01/2022   Past Surgical History:  Procedure Laterality Date   ABDOMINAL EXPOSURE N/A 03/06/2023   Procedure: ABDOMINAL EXPOSURE;  Surgeon: Cephus Shelling, MD;  Location: [facility] OR;  Service: Vascular;  Laterality: N/A;   ACHILLES TENDON REPAIR Right 12/26/2006   ANTERIOR LUMBAR FUSION N/A 03/06/2023   Procedure: L5-S1 ANTERIOR LUMBAR FUSION 1 LEVEL;  Surgeon: London Sheer, MD;  Location: [facility] OR;  Service: Orthopedics;  Laterality: N/A;   BIOPSY  08/29/2023   Procedure: BIOPSY;  Surgeon: Toney Reil, MD;  Location: [facility] ENDOSCOPY;  Service: Gastroenterology;;   CATARACT EXTRACTION W/PHACO Left 07/20/2021   Procedure: CATARACT EXTRACTION PHACO AND INTRAOCULAR LENS PLACEMENT (IOC) LEFT  2.11 00:24.0;  Surgeon: Galen Manila, MD;  Location: [facility]ERY [facility];  Service: Ophthalmology;  Laterality: Left;  sleep apnea   CATARACT EXTRACTION W/PHACO Right 08/03/2021   Procedure: CATARACT EXTRACTION PHACO AND INTRAOCULAR LENS PLACEMENT (IOC) RIGHT;  Surgeon: Galen Manila, MD;  Location: [facility];  Service: Ophthalmology;  Laterality: Right;  3.19 0:33.0   CHOLECYSTECTOMY     COLON RESECTION Left 12/26/2004   due to diverticular disease at [facility]   COLONOSCOPY  2013?   COLONOSCOPY N/A 11/30/2022   Procedure: COLONOSCOPY;  Surgeon: Toney Reil, MD;  Location: [facility] ENDOSCOPY;  Service: Gastroenterology;  Laterality: N/A;   COLONOSCOPY WITH PROPOFOL N/A 05/22/2018   Procedure: COLONOSCOPY WITH PROPOFOL;  Surgeon: Toney Reil, MD;  Location: [facility] ENDOSCOPY;  Service: Gastroenterology;  Laterality: N/A;   COLONOSCOPY WITH PROPOFOL N/A 10/22/2020   Procedure: COLONOSCOPY WITH PROPOFOL;  Surgeon: Toney Reil, MD;  Location: [facility];  Service: Endoscopy;  Laterality: N/A;  priority 4   COLONOSCOPY WITH PROPOFOL N/A 11/10/2021   Procedure: COLONOSCOPY WITH PROPOFOL;  Surgeon: Toney Reil, MD;  Location: [facility] ENDOSCOPY;  Service: Gastroenterology;  Laterality: N/A;   COLONOSCOPY WITH PROPOFOL N/A 06/16/2022   Procedure: COLONOSCOPY WITH PROPOFOL;  Surgeon: Toney Reil, MD;  Location:  [facility];  Service: Endoscopy;  Laterality: N/A;   COLONOSCOPY WITH PROPOFOL N/A 11/29/2022   Procedure: COLONOSCOPY WITH PROPOFOL;  Surgeon: Toney Reil, MD;  Location: [facility] ENDOSCOPY;  Service: Gastroenterology;  Laterality: N/A;   DRUG INDUCED ENDOSCOPY N/A 06/10/2019   Procedure: DRUG INDUCED SLEEP ENDOSCOPY;  Surgeon: Osborn Coho, MD;  Location: [facility];  Service: ENT;  Laterality: N/A;   ESOPHAGOGASTRODUODENOSCOPY (EGD) WITH PROPOFOL N/A 06/16/2022   Procedure: ESOPHAGOGASTRODUODENOSCOPY (EGD)  WITH PROPOFOL;  Surgeon: Toney Reil, MD;  Location: [facility];  Service: Endoscopy;  Laterality: N/A;   ESOPHAGOGASTRODUODENOSCOPY (EGD) WITH PROPOFOL N/A 05/24/2023   Procedure: ESOPHAGOGASTRODUODENOSCOPY (EGD) WITH PROPOFOL;  Surgeon: Toney Reil, MD;  Location: [facility] ENDOSCOPY;  Service: Gastroenterology;  Laterality: N/A;   ESOPHAGOGASTRODUODENOSCOPY (EGD) WITH PROPOFOL N/A 08/29/2023   Procedure: ESOPHAGOGASTRODUODENOSCOPY (EGD) WITH PROPOFOL;  Surgeon: Toney Reil, MD;  Location: [facility] ENDOSCOPY;  Service: Gastroenterology;  Laterality: N/A;   GASTRIC BYPASS N/A 12/27/2007   HARDWARE REMOVAL Right 06/22/2023   Procedure: HARDWARE REMOVAL;  Surgeon: Myrene Galas, MD;  Location: [facility] OR;  Service: Orthopedics;  Laterality: Right;   HIATAL HERNIA REPAIR  09/25/2014   at [facility]   KNEE ARTHROSCOPY Right 06/22/2023   Procedure: ARTHROSCOPY KNEE;  Surgeon: Myrene Galas, MD;  Location: [facility] OR;  Service: Orthopedics;  Laterality: Right;   ORIF FEMUR FRACTURE Right 08/30/2022   Procedure: OPEN REDUCTION INTERNAL FIXATION (ORIF) DISTAL FEMUR FRACTURE;  Surgeon: Myrene Galas, MD;  Location: [facility] OR;  Service: Orthopedics;  Laterality: Right;   POLYPECTOMY  10/22/2020   Procedure: POLYPECTOMY;  Surgeon: Toney Reil, MD;  Location: [facility];  Service: Endoscopy;;   POLYPECTOMY  06/16/2022   Procedure: POLYPECTOMY;  Surgeon: Toney Reil, MD;  Location: [facility];  Service: Endoscopy;;   ROTATOR CUFF REPAIR Right    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   TOTAL KNEE ARTHROPLASTY Right 09/19/2023   Procedure: RIGHT TOTAL KNEE ARTHROPLASTY;  Surgeon: Kathryne Hitch, MD;  Location: [facility] OR;  Service: Orthopedics;  Laterality: Right;   Patient Active Problem List   Diagnosis Date Noted   Secondary hyperparathyroidism, non-renal (HCC) 10/25/2023   Age-related osteoporosis without current pathological fracture 10/23/2023    S/P total knee arthroplasty, right 09/19/2023   Status post total right knee replacement 09/19/2023   Chronic pain syndrome 07/12/2023   Anxiety 09/01/2022   Vitamin D deficiency 09/01/2022   Metabolic bone disease  09/01/2022   Displaced supracondylar fracture of distal end of right femur without intracondylar extension (HCC) 08/29/2022   Mood disorder (HCC) 07/08/2022   Disability of walking 06/01/2022   Degeneration of lumbar intervertebral disc 06/01/2022   Iron deficiency anemia 05/10/2022   Lumbar spondylolysis 02/25/2022   Sleep disturbance 07/02/2021   Preventative health care 06/23/2018   Personal hx of gastric bypass 06/23/2018   Erectile dysfunction 06/23/2018   Hx of adenomatous colonic polyps    Essential hypertension 10/17/2013   Diverticulosis large intestine w/o perforation or abscess w/bleeding    Colon polyps     PCP: Dr. Tillman Abide  REFERRING PROVIDER: Dr. Doneen Poisson  REFERRING DIAG: [phone] (ICD-10-CM) - Status post total right knee replacement   THERAPY DIAG:  Muscle weakness (generalized)  Limited joint range of motion (ROM)  Difficulty in walking, not elsewhere classified  Stiffness of right knee, not elsewhere classified  Abnormality of gait and mobility  Acute pain of right knee  Rationale for Evaluation and Treatment:  Rehabilitation  ONSET DATE: 09/19/2023  SUBJECTIVE:   SUBJECTIVE STATEMENT:  Pt reports increased right knee pain since last visit- reports some intermittent shooting knee pain but better today- still about 6/10 knee pain.   PERTINENT HISTORY:  Per ortho note from Dr. Magnus Ivan: Ilona Sorrel, 57 y.o. male, has a history of pain and functional disability in the right knee due to trauma and has failed non-surgical conservative treatments for greater than 12 weeks to include NSAID's and/or analgesics, corticosteriod injections, use of assistive devices, and activity modification.  Onset of symptoms was  abrupt, starting 1 years ago with gradually worsening course since that time. The patient noted prior procedures on the knee to include  ORIF  on the right knee(s).  Per previous admission chart review from Lesli Albee, PT, DPT: Relevant past medical history and comorbidities include s/p hardware removal and arthroscopy of R knee on 06/22/2023 (hardware originally for distal medial condyle fracture);  s/p L5-S1 ANTERIOR LUMBAR FUSION on 03/06/2023; Diverticulosis large intestine w/o perforation or abscess w/bleeding; Essential hypertension; Rectal bleeding; Hx of adenomatous colonic polyps; Personal hx of gastric bypass; Erectile dysfunction; Sleep disturbance; Lumbar spondylolysis; Microcytic anemia; Iron deficiency anemia; Numbness and tingling; Imbalance; Difficulty sleeping; Disability of walking; Pain in joint involving ankle and foot; Degeneration of lumbar intervertebral disc; Erosive esophagitis; Mood disorder (HCC); Displaced supracondylar fracture of distal end of right femur without intracondylar extension (HCC); Anxiety; Vitamin D deficiency; Metabolic bone disease; and Radiculopathy, lumbar region. Please see above for relevant surgery history; osteoporosis, bowel/urinary incontinence, stumbling and dropping things. Patient denies hx of cancer, stroke, seizures, lung problems, heart problems, diabetes, and unexplained weight loss.   PAIN:  Are you having pain? Yes 3-4/10 knee  PRECAUTIONS: Knee  RED FLAGS: Bowel or bladder incontinence: Yes: bowel     WEIGHT BEARING RESTRICTIONS: No  FALLS:  Has patient fallen in last 6 months? 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  NEXT MD VISIT: Next month- Dr. Magnus Ivan  OBJECTIVE:  Note: Objective measures were completed at Evaluation unless otherwise noted.  DIAGNOSTIC FINDINGS:  Narrative & Impression  CLINICAL DATA:  Status post right knee replacement.   EXAM: PORTABLE RIGHT KNEE - 1-2 VIEW   COMPARISON:  06/22/2023   FINDINGS: Right knee arthroplasty in expected alignment. No periprosthetic lucency or fracture. There has been patellar resurfacing. Recent postsurgical change includes air and edema in the soft tissues and joint space. Chronic ghost tracks in the distal femur. Anterior skin staples in place.   IMPRESSION: Right knee arthroplasty without immediate postoperative complication.     Electronically Signed   By: Narda Rutherford M.D.   On: 09/19/2023 16:43    PATIENT SURVEYS:  FOTO 39 with goal of 54  COGNITION: Overall cognitive status: Within functional limits for tasks assessed     SENSATION: Light touch: Impaired - reported dead zone lateral joint line  EDEMA:  Circumferential:  1)bottom of incision=38; 2)10 cm above distal incision-=48, 3) top of incision= 49    POSTURE: No Significant postural limitations  PALPATION: Global tenderness along right knee- joint line and posterior knee  LOWER EXTREMITY ROM:  Active ROM Right eval Left eval  Hip flexion    Hip extension    Hip abduction    Hip adduction    Hip internal rotation    Hip external rotation    Knee flexion 90deg   Knee extension Lack 12 deg   Ankle dorsiflexion    Ankle plantarflexion    Ankle inversion    Ankle eversion     (Blank rows = not tested)    TODAY'S TREATMENT: DATE: 12/15/23    TherEx: Octane bike- L0 x 2 min and L4  for 3 min  *Patient continuously monitored for any pain or difficulty- able to increase resistance without pain after 2 min   NMR: in // bars or by mirror wall next to bars SLS - up to 20 sec each LE x 3 sets Staggered standing  on airex pad with ball toss/bounce 2 rounds of approx 20 tosses.  Mild unsteadiness Tandem standing on airex pad with ball bounce overhead at mirror/wall x 20 tosses x 2 sets. Increased unsteadiness initially improved on 2nd set.  High knee march walk - down and back - x 5 Ham curl walk- down and back x 5 Lateral side step with squat- down and back x 4 Toe walk- down and back x4    Manual:   Grade 2-3 tibiofemoral mobs PA/AP in sitting x 3 min. Measured Right knee AROM (Seated ) = 114 deg after all therex and Manual stretching.       PATIENT EDUCATION:  Education details: Purpose of PT, HEP, Pain management strategies including Ice, ROM, taking meds as prescribed. Pt educated throughout session about proper posture and technique with exercises. Improved exercise technique, movement at target joints, use of target muscles after min to mod verbal, visual, tactile cues.   Person educated: Patient Education method: Explanation, Demonstration, Tactile cues, Verbal cues, and Handouts Education comprehension: verbalized understanding, returned demonstration, verbal cues required, tactile cues required, and needs further education  HOME EXERCISE PROGRAM: Access Code: [email or web address] URL: [email or web address] Date: 10/25/2023 Prepared by: Maureen Ralphs  Exercises - Standing Hip Flexion March  - 1 x daily - 7 x weekly - 3 sets - 10 reps - Standing Hip Abduction with Counter Support  - 1 x daily - 7 x weekly - 3 sets - 10 reps - Standing Hip Extension with Counter Support  - 1 x daily - 7 x weekly - 3 sets - 10 reps - Standing Knee Flexion with Unilateral Counter Support  - 1 x daily - 3 sets - 10 reps - Mini Squat with Counter Support  - 1 x daily - 3 sets - 10 reps - Heel Toe Raises with Unilateral Counter Support  - 1 x daily - 1 x weekly - 3 sets - 10 reps - Toe Raises with Counter Support  - 1 x daily - 1 x weekly - 3 sets - 10 reps  Access Code: [facility] URL:  [email or web address] Date: 10/11/2023 Prepared by: Maureen Ralphs  Exercises - Seated Hamstring Stretch  - 2 x daily - 3-5 sets - 30-45 hold - Seated Knee Flexion Stretch  - 2 x daily - 3-5 sets - 30-45 hold  ASSESSMENT:  CLINICAL IMPRESSION:  Treatment focused on some dynamic standing activities designed to challenged weight bearing and stability of right knee. Patient performed well with no complaint of any increased pain. He was challenged with single leg stance and tandem gait today and improved his stability with practice.  Pt will continue to benefit from skilled therapy to address remaining deficits  in order to improve overall QoL and return to PLOF.     OBJECTIVE IMPAIRMENTS: Abnormal gait, decreased activity tolerance, decreased balance, decreased endurance, decreased mobility, difficulty walking, decreased ROM, decreased strength, hypomobility, impaired flexibility, and pain.   ACTIVITY LIMITATIONS: carrying, lifting, bending, sitting, standing, squatting, sleeping, stairs, transfers, bed mobility, and toileting  PARTICIPATION LIMITATIONS: meal prep, cleaning, laundry, driving, shopping, community activity, occupation, and yard work  PERSONAL FACTORS: 1-2 comorbidities: chronic pain, HTN  are also affecting patient's functional outcome.   REHAB POTENTIAL: Good  CLINICAL DECISION MAKING: Stable/uncomplicated  EVALUATION COMPLEXITY: Low   GOALS: Goals reviewed with patient? Yes  SHORT TERM GOALS: Target date: 11/08/2023 Pt will be independent with HEP in order to decrease ankle pain and increase strength in order to improve pain-free function at home and work.  Baseline: EVAL- No formal HEP in place; 12/10: independent  Goal status: ACHIEVED    LONG TERM GOALS: Target date: 12/06/2023  Pt will decrease worst pain as reported on NPRS by at least 3 points in order to demonstrate clinically significant reduction in ankle/foot pain.  Baseline: EVAL- R  knee pain = 7-8/10; 12/05/23: 6-7/10 worst pain  12/07/23: 6-7 over past 7 days Goal status: ONGOING  2.  Pt will improve FOTO to target score of 54 to display perceived improvements in ability to complete ADL's.  Baseline: EVAL= 39;  12/05/23: 58 12/07/23: 49 Goal status: MET  3.  Patient will improve Right knee AROM extension to <5 deg from zero for improved ability to straighten his leg with all mobility.  Baseline: EVAL= 12 deg; 12/10: ~10 degrees but still quite edematous 12/07/23: 3 deg lacking Goal status: ONGOING  4.  Patient will improve Right knee AROM Flexion to > 120 deg for optimal right knee mobility with steps and sitting.  Baseline: EVAL= 90 deg; 12/05/23:  12/07/23: 109 deg Goal status: ONGOING  5.  Patient (> 80 years old) will complete five times sit to stand test in < 15 seconds indicating an increased LE strength and improved balance. Baseline: 11/14: 31.64 sec no UE support  12/07/23: 22.91 sec with no UE Goal status: ONGOING   6.  Patient will reduce timed up and go to <11 seconds to reduce fall risk and demonstrate improved transfer/gait ability. Baseline: 11/14 18.96 sec with SPC 12/07/23: 16.52 sec without AD; 12.76 sec with SPC Goal status: INITIAL  7.  Patient will increase 10 meter walk test to >1.28m/s as to improve gait speed for better community ambulation and to reduce fall risk. Baseline: 11:14 0.15m/s 12/07/23: 0.96 m/s Goal status: ONGOING    PLAN:  PT FREQUENCY: 1-2x/week  PT DURATION: 8 weeks  PLANNED INTERVENTIONS: 97164- PT Re-evaluation, 97110-Therapeutic exercises, 97530- Therapeutic activity, 97112- Neuromuscular re-education, 97535- Self Care, [id number]- Manual therapy, [id number]- Gait training, [phone]- Orthotic Fit/training, [phone]- Electrical stimulation (manual), [phone]- Ultrasound, Patient/Family education, Balance training, Stair training, Taping, Dry Needling, Joint mobilization, Manual lymph drainage, Scar mobilization, Compression  bandaging, Cryotherapy, and Moist heat  PLAN FOR NEXT SESSION:   Manual therapy for ROM, TE for strengthening and knee stability with reduced UE support .   Louis Meckel, PT Physical Therapist - [facility]  12/15/23, 8:26 AM

## 2023-12-15 ENCOUNTER — Other Ambulatory Visit: Payer: Self-pay | Admitting: Internal Medicine

## 2023-12-18 ENCOUNTER — Telehealth: Payer: Self-pay | Admitting: Orthopedic Surgery

## 2023-12-18 ENCOUNTER — Encounter: Payer: Self-pay | Admitting: Orthopedic Surgery

## 2023-12-18 NOTE — Therapy (Signed)
OUTPATIENT PHYSICAL THERAPY TREATMENT   Patient Name: Harold Smith MRN: [id number] DOB:05/08/1966, 57 y.o., male Today's Date: 12/19/2023  END OF SESSION:  PT End of Session - 12/19/23 0841     Visit Number 13    Number of Visits 26    Date for PT Re-Evaluation 01/31/24    Authorization Type [employer] [id number]    Authorization - Number of Visits 30    Progress Note Due on Visit 20    PT Start Time 0847    PT Stop Time 0927    PT Time Calculation (min) 40 min    Equipment Utilized During Treatment Gait belt    Activity Tolerance Patient tolerated treatment well;No increased pain    Behavior During Therapy WFL for tasks assessed/performed                   Past Medical History:  Diagnosis Date   Allergic rhinitis due to pollen    Anemia    Anxiety    Chronic pain    Colon polyps    Depression    Diverticulitis large intestine    Erosive esophagitis    GERD (gastroesophageal reflux disease)    HTN (hypertension)    Irritable bowel syndrome    Metabolic bone disease 09/01/2022   Obesity    Osteoarthritis, knee    Sleep apnea    does not use cpap, pt states he no longer needed CPAP due to weight loss   Sleep disturbance    Vitamin D deficiency 09/01/2022   Past Surgical History:  Procedure Laterality Date   ABDOMINAL EXPOSURE N/A 03/06/2023   Procedure: ABDOMINAL EXPOSURE;  Surgeon: Cephus Shelling, MD;  Location: [facility] OR;  Service: Vascular;  Laterality: N/A;   ACHILLES TENDON REPAIR Right 12/26/2006   ANTERIOR LUMBAR FUSION N/A 03/06/2023   Procedure: L5-S1 ANTERIOR LUMBAR FUSION 1 LEVEL;  Surgeon: London Sheer, MD;  Location: [facility] OR;  Service: Orthopedics;  Laterality: N/A;   BIOPSY  08/29/2023   Procedure: BIOPSY;  Surgeon: Toney Reil, MD;  Location: [facility] ENDOSCOPY;  Service: Gastroenterology;;   CATARACT EXTRACTION W/PHACO Left 07/20/2021   Procedure: CATARACT EXTRACTION PHACO AND INTRAOCULAR LENS PLACEMENT (IOC) LEFT  2.11 00:24.0;  Surgeon: Galen Manila, MD;  Location: [facility]ERY CNTR;  Service: Ophthalmology;  Laterality: Left;  sleep apnea   CATARACT EXTRACTION W/PHACO Right 08/03/2021   Procedure: CATARACT EXTRACTION PHACO AND INTRAOCULAR LENS PLACEMENT (IOC) RIGHT;  Surgeon: Galen Manila, MD;  Location: [facility];  Service: Ophthalmology;  Laterality: Right;  3.19 0:33.0   CHOLECYSTECTOMY     COLON RESECTION Left 12/26/2004   due to diverticular disease at [facility]   COLONOSCOPY  2013?   COLONOSCOPY N/A 11/30/2022   Procedure: COLONOSCOPY;  Surgeon: Toney Reil, MD;  Location: [facility] ENDOSCOPY;  Service: Gastroenterology;  Laterality: N/A;   COLONOSCOPY WITH PROPOFOL N/A 05/22/2018   Procedure: COLONOSCOPY WITH PROPOFOL;  Surgeon: Toney Reil, MD;  Location: [facility] ENDOSCOPY;  Service: Gastroenterology;  Laterality: N/A;   COLONOSCOPY WITH PROPOFOL N/A 10/22/2020   Procedure: COLONOSCOPY WITH PROPOFOL;  Surgeon: Toney Reil, MD;  Location: [facility];  Service: Endoscopy;  Laterality: N/A;  priority 4   COLONOSCOPY WITH PROPOFOL N/A 11/10/2021   Procedure: COLONOSCOPY WITH PROPOFOL;  Surgeon: Toney Reil, MD;  Location: [facility] ENDOSCOPY;  Service: Gastroenterology;  Laterality: N/A;   COLONOSCOPY WITH PROPOFOL N/A 06/16/2022   Procedure: COLONOSCOPY WITH PROPOFOL;  Surgeon: Toney Reil, MD;  Location: [facility];  Service: Endoscopy;  Laterality: N/A;   COLONOSCOPY WITH PROPOFOL N/A 11/29/2022   Procedure: COLONOSCOPY WITH PROPOFOL;  Surgeon: Toney Reil, MD;  Location: [facility] ENDOSCOPY;  Service: Gastroenterology;  Laterality: N/A;   DRUG INDUCED ENDOSCOPY N/A 06/10/2019   Procedure: DRUG INDUCED SLEEP ENDOSCOPY;  Surgeon: Osborn Coho, MD;  Location: [facility];  Service: ENT;  Laterality: N/A;   ESOPHAGOGASTRODUODENOSCOPY (EGD) WITH PROPOFOL N/A 06/16/2022   Procedure: ESOPHAGOGASTRODUODENOSCOPY (EGD)  WITH PROPOFOL;  Surgeon: Toney Reil, MD;  Location: [facility];  Service: Endoscopy;  Laterality: N/A;   ESOPHAGOGASTRODUODENOSCOPY (EGD) WITH PROPOFOL N/A 05/24/2023   Procedure: ESOPHAGOGASTRODUODENOSCOPY (EGD) WITH PROPOFOL;  Surgeon: Toney Reil, MD;  Location: [facility] ENDOSCOPY;  Service: Gastroenterology;  Laterality: N/A;   ESOPHAGOGASTRODUODENOSCOPY (EGD) WITH PROPOFOL N/A 08/29/2023   Procedure: ESOPHAGOGASTRODUODENOSCOPY (EGD) WITH PROPOFOL;  Surgeon: Toney Reil, MD;  Location: [facility] ENDOSCOPY;  Service: Gastroenterology;  Laterality: N/A;   GASTRIC BYPASS N/A 12/27/2007   HARDWARE REMOVAL Right 06/22/2023   Procedure: HARDWARE REMOVAL;  Surgeon: Myrene Galas, MD;  Location: [facility] OR;  Service: Orthopedics;  Laterality: Right;   HIATAL HERNIA REPAIR  09/25/2014   at [facility]   KNEE ARTHROSCOPY Right 06/22/2023   Procedure: ARTHROSCOPY KNEE;  Surgeon: Myrene Galas, MD;  Location: [facility] OR;  Service: Orthopedics;  Laterality: Right;   ORIF FEMUR FRACTURE Right 08/30/2022   Procedure: OPEN REDUCTION INTERNAL FIXATION (ORIF) DISTAL FEMUR FRACTURE;  Surgeon: Myrene Galas, MD;  Location: [facility] OR;  Service: Orthopedics;  Laterality: Right;   POLYPECTOMY  10/22/2020   Procedure: POLYPECTOMY;  Surgeon: Toney Reil, MD;  Location: [facility];  Service: Endoscopy;;   POLYPECTOMY  06/16/2022   Procedure: POLYPECTOMY;  Surgeon: Toney Reil, MD;  Location: [facility];  Service: Endoscopy;;   ROTATOR CUFF REPAIR Right    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   TOTAL KNEE ARTHROPLASTY Right 09/19/2023   Procedure: RIGHT TOTAL KNEE ARTHROPLASTY;  Surgeon: Kathryne Hitch, MD;  Location: [facility] OR;  Service: Orthopedics;  Laterality: Right;   Patient Active Problem List   Diagnosis Date Noted   Secondary hyperparathyroidism, non-renal (HCC) 10/25/2023   Age-related osteoporosis without current pathological fracture 10/23/2023    S/P total knee arthroplasty, right 09/19/2023   Status post total right knee replacement 09/19/2023   Chronic pain syndrome 07/12/2023   Anxiety 09/01/2022   Vitamin D deficiency 09/01/2022   Metabolic bone disease  09/01/2022   Displaced supracondylar fracture of distal end of right femur without intracondylar extension (HCC) 08/29/2022   Mood disorder (HCC) 07/08/2022   Disability of walking 06/01/2022   Degeneration of lumbar intervertebral disc 06/01/2022   Iron deficiency anemia 05/10/2022   Lumbar spondylolysis 02/25/2022   Sleep disturbance 07/02/2021   Preventative health care 06/23/2018   Personal hx of gastric bypass 06/23/2018   Erectile dysfunction 06/23/2018   Hx of adenomatous colonic polyps    Essential hypertension 10/17/2013   Diverticulosis large intestine w/o perforation or abscess w/bleeding    Colon polyps     PCP: Dr. Tillman Abide  REFERRING PROVIDER: Dr. Doneen Poisson  REFERRING DIAG: [phone] (ICD-10-CM) - Status post total right knee replacement   THERAPY DIAG:  Muscle weakness (generalized)  Limited joint range of motion (ROM)  Difficulty in walking, not elsewhere classified  Stiffness of right knee, not elsewhere classified  Abnormality of gait and mobility  Rationale for Evaluation and Treatment: Rehabilitation  ONSET DATE: 09/19/2023  SUBJECTIVE:   SUBJECTIVE STATEMENT:  Patient reports some clicking in right knee and not using his knee support. Reports 5/10 right knee pain    PERTINENT HISTORY:  Per ortho note from Dr. Magnus Ivan: Ilona Sorrel, 57 y.o. male, has a history of pain and functional disability in the right knee due to trauma and has failed non-surgical conservative treatments for greater than 12 weeks to include NSAID's and/or analgesics, corticosteriod injections, use of assistive devices, and activity modification.  Onset of symptoms was abrupt, starting 1 years ago with gradually worsening course since that  time. The patient noted prior procedures on the knee to include  ORIF  on the right knee(s).  Per previous admission chart review from Lesli Albee, PT, DPT: Relevant past medical history and comorbidities include s/p hardware removal and arthroscopy of R knee on 06/22/2023 (hardware originally for distal medial condyle fracture);  s/p L5-S1 ANTERIOR LUMBAR FUSION on 03/06/2023; Diverticulosis large intestine w/o perforation or abscess w/bleeding; Essential hypertension; Rectal bleeding; Hx of adenomatous colonic polyps; Personal hx of gastric bypass; Erectile dysfunction; Sleep disturbance; Lumbar spondylolysis; Microcytic anemia; Iron deficiency anemia; Numbness and tingling; Imbalance; Difficulty sleeping; Disability of walking; Pain in joint involving ankle and foot; Degeneration of lumbar intervertebral disc; Erosive esophagitis; Mood disorder (HCC); Displaced supracondylar fracture of distal end of right femur without intracondylar extension (HCC); Anxiety; Vitamin D deficiency; Metabolic bone disease; and Radiculopathy, lumbar region. Please see above for relevant surgery history; osteoporosis, bowel/urinary incontinence, stumbling and dropping things. Patient denies hx of cancer, stroke, seizures, lung problems, heart problems, diabetes, and unexplained weight loss.   PAIN:  Are you having pain? Yes 3-4/10 knee  PRECAUTIONS: Knee  RED FLAGS: Bowel or bladder incontinence: Yes: bowel     WEIGHT BEARING RESTRICTIONS: No  FALLS:  Has patient fallen in last 6 months? 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  NEXT MD VISIT: Next month- Dr.  Magnus Ivan  OBJECTIVE:  Note: Objective measures were completed at Evaluation unless otherwise noted.  DIAGNOSTIC FINDINGS:  Narrative & Impression  CLINICAL DATA:  Status post right knee replacement.   EXAM: PORTABLE RIGHT KNEE - 1-2 VIEW   COMPARISON:  06/22/2023   FINDINGS: Right knee arthroplasty in expected alignment. No periprosthetic lucency or fracture. There has been patellar resurfacing. Recent postsurgical change includes air and edema in the soft tissues and joint space. Chronic ghost tracks in the distal femur. Anterior skin staples in place.   IMPRESSION: Right knee arthroplasty without immediate postoperative complication.     Electronically Signed   By: Narda Rutherford M.D.   On: 09/19/2023 16:43    PATIENT SURVEYS:  FOTO 39 with goal of 54  COGNITION: Overall cognitive status: Within functional limits for tasks assessed     SENSATION: Light touch: Impaired - reported dead zone lateral joint line  EDEMA:  Circumferential:  1)bottom of incision=38; 2)10 cm above distal incision-=48, 3) top of incision= 49    POSTURE: No Significant postural limitations  PALPATION: Global tenderness along right knee- joint line and posterior knee  LOWER EXTREMITY ROM:  Active ROM Right eval Left eval  Hip flexion    Hip extension  Hip abduction    Hip adduction    Hip internal rotation    Hip external rotation    Knee flexion 90deg   Knee extension Lack 12 deg   Ankle dorsiflexion    Ankle plantarflexion    Ankle inversion    Ankle eversion     (Blank rows = not tested)    TODAY'S TREATMENT: DATE: 12/19/23    TherEx: Level 1 Nustep- LE only x 5 min  *Patient continuously monitored for any pain or difficulty Standing calf raises from 1/2 foam roll x 20 reps  NMR: in // bars or by mirror wall next to bars  Lateral side step up/over orange hurdle x 20 reps  Forward/retro step up/over orange hurdle x 20 reps Heel to toe gait sequencing -  Placing swing leg (toe box onto 1/2 foam roll with heel strike on floor) x 25 reps each LE. SLS - up to 20 sec each LE x 3 sets Staggered standing on airex pad with ball toss/bounce 2 rounds of approx 20 tosses.  Mild unsteadiness Tandem standing on 1/2 foam roll (curve side up) - hold 30 sec x 2 each side    Manual:   Grade 2-3 tibiofemoral mobs PA/AP in sitting x 3 min. Manual stretching into Right knee flex x several min (patient denied any pain just tightness)  Measured Right knee AROM (Seated ) = 122 deg      PATIENT EDUCATION:  Education details: Purpose of PT, HEP, Pain management strategies including Ice, ROM, taking meds as prescribed. Pt educated throughout session about proper posture and technique with exercises. Improved exercise technique, movement at target joints, use of target muscles after min to mod verbal, visual, tactile cues.   Person educated: Patient Education method: Explanation, Demonstration, Tactile cues, Verbal cues, and Handouts Education comprehension: verbalized understanding, returned demonstration, verbal cues required, tactile cues required, and needs further education  HOME EXERCISE PROGRAM: Access Code: [email or web address] URL: [email or web address] Date: 10/25/2023 Prepared by: Maureen Ralphs  Exercises - Standing Hip Flexion March  - 1 x daily - 7 x weekly - 3 sets - 10 reps - Standing Hip Abduction with Counter Support  - 1 x daily - 7 x weekly - 3 sets - 10 reps - Standing Hip Extension with Counter Support  - 1 x daily - 7 x weekly - 3 sets - 10 reps - Standing Knee Flexion with Unilateral Counter Support  - 1 x daily - 3 sets - 10 reps - Mini Squat with Counter Support  - 1 x daily - 3 sets - 10 reps - Heel Toe Raises with Unilateral Counter Support  - 1 x daily - 1 x weekly - 3 sets - 10 reps - Toe Raises with Counter Support  - 1 x daily - 1 x weekly - 3 sets - 10 reps  Access Code: [facility] URL:  [email or web address] Date: 10/11/2023 Prepared by: Maureen Ralphs  Exercises - Seated Hamstring Stretch  - 2 x daily - 3-5 sets - 30-45 hold - Seated Knee Flexion Stretch  - 2 x daily - 3-5 sets - 30-45 hold  ASSESSMENT:  CLINICAL IMPRESSION:  Treatment continued to focus on some dynamic standing activities designed to challenged weight bearing through surgical limb and improve stability of right knee. Patient presented with much improved overall ROM after manual stretching today. His pain continues to be well controlled and able to progress more with dynamic balance activities with no increase in pain. Pt will continue to benefit from  skilled therapy to address remaining deficits in order to improve overall QoL and return to PLOF.     OBJECTIVE IMPAIRMENTS: Abnormal gait, decreased activity tolerance, decreased balance, decreased endurance, decreased mobility, difficulty walking, decreased ROM, decreased strength, hypomobility, impaired flexibility, and pain.   ACTIVITY LIMITATIONS: carrying, lifting, bending, sitting, standing, squatting, sleeping, stairs, transfers, bed mobility, and toileting  PARTICIPATION LIMITATIONS: meal prep, cleaning, laundry, driving, shopping, community activity, occupation, and yard work  PERSONAL FACTORS: 1-2 comorbidities: chronic pain, HTN  are also affecting patient's functional outcome.   REHAB POTENTIAL: Good  CLINICAL DECISION MAKING: Stable/uncomplicated  EVALUATION COMPLEXITY: Low   GOALS: Goals reviewed with patient? Yes  SHORT TERM GOALS: Target date: 11/08/2023 Pt will be independent with HEP in order to decrease ankle pain and increase strength in order to improve pain-free function at home and work.  Baseline: EVAL- No formal HEP in place; 12/10: independent  Goal status: ACHIEVED    LONG TERM GOALS: Target date: 12/06/2023  Pt will decrease worst pain as reported on NPRS by at least 3 points in order to  demonstrate clinically significant reduction in ankle/foot pain.  Baseline: EVAL- R knee pain = 7-8/10; 12/05/23: 6-7/10 worst pain  12/07/23: 6-7 over past 7 days Goal status: ONGOING  2.  Pt will improve FOTO to target score of 54 to display perceived improvements in ability to complete ADL's.  Baseline: EVAL= 39;  12/05/23: 58 12/07/23: 49 Goal status: MET  3.  Patient will improve Right knee AROM extension to <5 deg from zero for improved ability to straighten his leg with all mobility.  Baseline: EVAL= 12 deg; 12/10: ~10 degrees but still quite edematous 12/07/23: 3 deg lacking Goal status: ONGOING  4.  Patient will improve Right knee AROM Flexion to > 120 deg for optimal right knee mobility with steps and sitting.  Baseline: EVAL= 90 deg; 12/05/23:  12/07/23: 109 deg Goal status: ONGOING  5.  Patient (> 82 years old) will complete five times sit to stand test in < 15 seconds indicating an increased LE strength and improved balance. Baseline: 11/14: 31.64 sec no UE support  12/07/23: 22.91 sec with no UE Goal status: ONGOING   6.  Patient will reduce timed up and go to <11 seconds to reduce fall risk and demonstrate improved transfer/gait ability. Baseline: 11/14 18.96 sec with SPC 12/07/23: 16.52 sec without AD; 12.76 sec with SPC Goal status: INITIAL  7.  Patient will increase 10 meter walk test to >1.32m/s as to improve gait speed for better community ambulation and to reduce fall risk. Baseline: 11:14 0.25m/s 12/07/23: 0.96 m/s Goal status: ONGOING    PLAN:  PT FREQUENCY: 1-2x/week  PT DURATION: 8 weeks  PLANNED INTERVENTIONS: 97164- PT Re-evaluation, 97110-Therapeutic exercises, 97530- Therapeutic activity, 97112- Neuromuscular re-education, 97535- Self Care, 09811- Manual therapy, [id number]- Gait training, [phone]- Orthotic Fit/training, [phone]- Electrical stimulation (manual), [phone]- Ultrasound, Patient/Family education, Balance training, Stair training, Taping, Dry  Needling, Joint mobilization, Manual lymph drainage, Scar mobilization, Compression bandaging, Cryotherapy, and Moist heat  PLAN FOR NEXT SESSION:   Manual therapy for ROM, TE for strengthening and knee stability, and continue with dynamic balance activities.    Louis Meckel, PT Physical Therapist - [facility]  12/19/23, 10:37 AM

## 2023-12-18 NOTE — Telephone Encounter (Signed)
Patient called. Says he can't get in to pain clinic until February. Would like something called in for his pain. His cb# [phone]

## 2023-12-18 NOTE — Telephone Encounter (Signed)
I talked to [place] and from their perspective he does not need anything pain medicine wise.  He does not look like he is on currently any narcotic pain medication.  I think Dr. Christell Constant was considering spine surgery but it was denied by his insurance.  May want to check with him when he gets back about yes or no on pain medicine.

## 2023-12-19 ENCOUNTER — Ambulatory Visit: Payer: 59

## 2023-12-19 DIAGNOSIS — M2560 Stiffness of unspecified joint, not elsewhere classified: Secondary | ICD-10-CM

## 2023-12-19 DIAGNOSIS — M6281 Muscle weakness (generalized): Secondary | ICD-10-CM

## 2023-12-19 DIAGNOSIS — R269 Unspecified abnormalities of gait and mobility: Secondary | ICD-10-CM

## 2023-12-19 DIAGNOSIS — R262 Difficulty in walking, not elsewhere classified: Secondary | ICD-10-CM

## 2023-12-19 DIAGNOSIS — M25661 Stiffness of right knee, not elsewhere classified: Secondary | ICD-10-CM

## 2023-12-19 DIAGNOSIS — Z7689 Persons encountering health services in other specified circumstances: Secondary | ICD-10-CM | POA: Diagnosis not present

## 2023-12-21 ENCOUNTER — Ambulatory Visit: Payer: 59

## 2023-12-21 DIAGNOSIS — M2560 Stiffness of unspecified joint, not elsewhere classified: Secondary | ICD-10-CM

## 2023-12-21 DIAGNOSIS — R262 Difficulty in walking, not elsewhere classified: Secondary | ICD-10-CM

## 2023-12-21 DIAGNOSIS — M25561 Pain in right knee: Secondary | ICD-10-CM

## 2023-12-21 DIAGNOSIS — M6281 Muscle weakness (generalized): Secondary | ICD-10-CM

## 2023-12-21 DIAGNOSIS — R269 Unspecified abnormalities of gait and mobility: Secondary | ICD-10-CM

## 2023-12-21 MED ORDER — HYDROMORPHONE HCL 4 MG PO TABS: 4.0000 mg | ORAL_TABLET | Freq: Four times a day (QID) | ORAL | 0 refills | Status: AC | PRN

## 2023-12-21 NOTE — Addendum Note (Signed)
Addended by: Willia Craze on: 12/21/2023 10:18 AM   Modules accepted: Orders

## 2023-12-21 NOTE — Therapy (Signed)
OUTPATIENT PHYSICAL THERAPY TREATMENT   Patient Name: Harold Smith MRN: [id number] DOB:[birth year], 57 y.o., male Today's Date: 12/21/2023  END OF SESSION:  PT End of Session - 12/21/23 0927     Visit Number 14    Number of Visits 26    Date for PT Re-Evaluation 01/31/24    Authorization Type [employer] [id number]    Authorization - Number of Visits 30    Progress Note Due on Visit 20    PT Start Time 0930    PT Stop Time 1015    PT Time Calculation (min) 45 min    Equipment Utilized During Treatment Gait belt    Activity Tolerance Patient tolerated treatment well;No increased pain    Behavior During Therapy WFL for tasks assessed/performed             Past Medical History:  Diagnosis Date   Allergic rhinitis due to pollen    Anemia    Anxiety    Chronic pain    Colon polyps    Depression    Diverticulitis large intestine    Erosive esophagitis    GERD (gastroesophageal reflux disease)    HTN (hypertension)    Irritable bowel syndrome    Metabolic bone disease 09/01/2022   Obesity    Osteoarthritis, knee    Sleep apnea    does not use cpap, pt states he no longer needed CPAP due to weight loss   Sleep disturbance    Vitamin D deficiency 09/01/2022   Past Surgical History:  Procedure Laterality Date   ABDOMINAL EXPOSURE N/A 03/06/2023   Procedure: ABDOMINAL EXPOSURE;  Surgeon: Cephus Shelling, MD;  Location: [facility] OR;  Service: Vascular;  Laterality: N/A;   ACHILLES TENDON REPAIR Right 12/26/2006   ANTERIOR LUMBAR FUSION N/A 03/06/2023   Procedure: L5-S1 ANTERIOR LUMBAR FUSION 1 LEVEL;  Surgeon: London Sheer, MD;  Location: [facility] OR;  Service: Orthopedics;  Laterality: N/A;   BIOPSY  08/29/2023   Procedure: BIOPSY;  Surgeon: Toney Reil, MD;  Location: [facility] ENDOSCOPY;  Service: Gastroenterology;;   CATARACT EXTRACTION W/PHACO Left 07/20/2021   Procedure: CATARACT EXTRACTION PHACO AND INTRAOCULAR LENS PLACEMENT (IOC) LEFT 2.11 00:24.0;   Surgeon: Galen Manila, MD;  Location: [facility] SURGERY CNTR;  Service: Ophthalmology;  Laterality: Left;  sleep apnea   CATARACT EXTRACTION W/PHACO Right 08/03/2021   Procedure: CATARACT EXTRACTION PHACO AND INTRAOCULAR LENS PLACEMENT (IOC) RIGHT;  Surgeon: Galen Manila, MD;  Location: [facility] SURGERY CNTR;  Service: Ophthalmology;  Laterality: Right;  3.19 0:33.0   CHOLECYSTECTOMY     COLON RESECTION Left 12/26/2004   due to diverticular disease at [facility]   COLONOSCOPY  2013?   COLONOSCOPY N/A 11/30/2022   Procedure: COLONOSCOPY;  Surgeon: Toney Reil, MD;  Location: [facility] ENDOSCOPY;  Service: Gastroenterology;  Laterality: N/A;   COLONOSCOPY WITH PROPOFOL N/A 05/22/2018   Procedure: COLONOSCOPY WITH PROPOFOL;  Surgeon: Toney Reil, MD;  Location: [facility] ENDOSCOPY;  Service: Gastroenterology;  Laterality: N/A;   COLONOSCOPY WITH PROPOFOL N/A 10/22/2020   Procedure: COLONOSCOPY WITH PROPOFOL;  Surgeon: Toney Reil, MD;  Location: [facility] SURGERY CNTR;  Service: Endoscopy;  Laterality: N/A;  priority 4   COLONOSCOPY WITH PROPOFOL N/A 11/10/2021   Procedure: COLONOSCOPY WITH PROPOFOL;  Surgeon: Toney Reil, MD;  Location: [facility] ENDOSCOPY;  Service: Gastroenterology;  Laterality: N/A;   COLONOSCOPY WITH PROPOFOL N/A 06/16/2022   Procedure: COLONOSCOPY WITH PROPOFOL;  Surgeon: Toney Reil, MD;  Location: [facility] SURGERY CNTR;  Service:  Endoscopy;  Laterality: N/A;   COLONOSCOPY WITH PROPOFOL N/A 11/29/2022   Procedure: COLONOSCOPY WITH PROPOFOL;  Surgeon: Toney Reil, MD;  Location: [facility] ENDOSCOPY;  Service: Gastroenterology;  Laterality: N/A;   DRUG INDUCED ENDOSCOPY N/A 06/10/2019   Procedure: DRUG INDUCED SLEEP ENDOSCOPY;  Surgeon: Osborn Coho, MD;  Location: [facility];  Service: ENT;  Laterality: N/A;   ESOPHAGOGASTRODUODENOSCOPY (EGD) WITH PROPOFOL N/A 06/16/2022   Procedure: ESOPHAGOGASTRODUODENOSCOPY (EGD) WITH PROPOFOL;   Surgeon: Toney Reil, MD;  Location: [facility] SURGERY CNTR;  Service: Endoscopy;  Laterality: N/A;   ESOPHAGOGASTRODUODENOSCOPY (EGD) WITH PROPOFOL N/A 05/24/2023   Procedure: ESOPHAGOGASTRODUODENOSCOPY (EGD) WITH PROPOFOL;  Surgeon: Toney Reil, MD;  Location: [facility] ENDOSCOPY;  Service: Gastroenterology;  Laterality: N/A;   ESOPHAGOGASTRODUODENOSCOPY (EGD) WITH PROPOFOL N/A 08/29/2023   Procedure: ESOPHAGOGASTRODUODENOSCOPY (EGD) WITH PROPOFOL;  Surgeon: Toney Reil, MD;  Location: [facility] ENDOSCOPY;  Service: Gastroenterology;  Laterality: N/A;   GASTRIC BYPASS N/A 12/27/2007   HARDWARE REMOVAL Right 06/22/2023   Procedure: HARDWARE REMOVAL;  Surgeon: Myrene Galas, MD;  Location: [facility] OR;  Service: Orthopedics;  Laterality: Right;   HIATAL HERNIA REPAIR  09/25/2014   at [facility]   KNEE ARTHROSCOPY Right 06/22/2023   Procedure: ARTHROSCOPY KNEE;  Surgeon: Myrene Galas, MD;  Location: [facility] OR;  Service: Orthopedics;  Laterality: Right;   ORIF FEMUR FRACTURE Right 08/30/2022   Procedure: OPEN REDUCTION INTERNAL FIXATION (ORIF) DISTAL FEMUR FRACTURE;  Surgeon: Myrene Galas, MD;  Location: [facility] OR;  Service: Orthopedics;  Laterality: Right;   POLYPECTOMY  10/22/2020   Procedure: POLYPECTOMY;  Surgeon: Toney Reil, MD;  Location: [facility] SURGERY [facility]TR;  Service: Endoscopy;;   POLYPECTOMY  06/16/2022   Procedure: POLYPECTOMY;  Surgeon: Toney Reil, MD;  Location: [facility] SURGERY CNTR;  Service: Endoscopy;;   ROTATOR CUFF REPAIR Right    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   TOTAL KNEE ARTHROPLASTY Right 09/19/2023   Procedure: RIGHT TOTAL KNEE ARTHROPLASTY;  Surgeon: Kathryne Hitch, MD;  Location: [facility] OR;  Service: Orthopedics;  Laterality: Right;   Patient Active Problem List   Diagnosis Date Noted   Secondary hyperparathyroidism, non-renal (HCC) 10/25/2023   Age-related osteoporosis without current pathological fracture 10/23/2023   S/P total  knee arthroplasty, right 09/19/2023   Status post total right knee replacement 09/19/2023   Chronic pain syndrome 07/12/2023   Anxiety 09/01/2022   Vitamin D deficiency 09/01/2022   Metabolic bone disease  09/01/2022   Displaced supracondylar fracture of distal end of right femur without intracondylar extension (HCC) 08/29/2022   Mood disorder (HCC) 07/08/2022   Disability of walking 06/01/2022   Degeneration of lumbar intervertebral disc 06/01/2022   Iron deficiency anemia 05/10/2022   Lumbar spondylolysis 02/25/2022   Sleep disturbance 07/02/2021   Preventative health care 06/23/2018   Personal hx of gastric bypass 06/23/2018   Erectile dysfunction 06/23/2018   Hx of adenomatous colonic polyps    Essential hypertension 10/17/2013   Diverticulosis large intestine w/o perforation or abscess w/bleeding    Colon polyps     PCP: Dr. Tillman Abide  REFERRING PROVIDER: Dr. Doneen Poisson  REFERRING DIAG: [phone] (ICD-10-CM) - Status post total right knee replacement   THERAPY DIAG:  Muscle weakness (generalized)  Limited joint range of motion (ROM)  Difficulty in walking, not elsewhere classified  Abnormality of gait and mobility  Acute pain of right knee  Rationale for Evaluation and Treatment: Rehabilitation  ONSET DATE: 09/19/2023  SUBJECTIVE:   SUBJECTIVE STATEMENT:  Pt  reports 6/10 pain in the knee.  Pt states he is just stiff from all the cold weather.  PERTINENT HISTORY:  Per ortho note from Dr. Magnus Ivan: Ilona Sorrel, 57 y.o. male, has a history of pain and functional disability in the right knee due to trauma and has failed non-surgical conservative treatments for greater than 12 weeks to include NSAID's and/or analgesics, corticosteriod injections, use of assistive devices, and activity modification.  Onset of symptoms was abrupt, starting 1 years ago with gradually worsening course since that time. The patient noted prior procedures on the knee to  include  ORIF  on the right knee(s).  Per previous admission chart review from Lesli Albee, PT, DPT: Relevant past medical history and comorbidities include s/p hardware removal and arthroscopy of R knee on 06/22/2023 (hardware originally for distal medial condyle fracture);  s/p L5-S1 ANTERIOR LUMBAR FUSION on 03/06/2023; Diverticulosis large intestine w/o perforation or abscess w/bleeding; Essential hypertension; Rectal bleeding; Hx of adenomatous colonic polyps; Personal hx of gastric bypass; Erectile dysfunction; Sleep disturbance; Lumbar spondylolysis; Microcytic anemia; Iron deficiency anemia; Numbness and tingling; Imbalance; Difficulty sleeping; Disability of walking; Pain in joint involving ankle and foot; Degeneration of lumbar intervertebral disc; Erosive esophagitis; Mood disorder (HCC); Displaced supracondylar fracture of distal end of right femur without intracondylar extension (HCC); Anxiety; Vitamin D deficiency; Metabolic bone disease; and Radiculopathy, lumbar region. Please see above for relevant surgery history; osteoporosis, bowel/urinary incontinence, stumbling and dropping things. Patient denies hx of cancer, stroke, seizures, lung problems, heart problems, diabetes, and unexplained weight loss.   PAIN:  Are you having pain? Yes 3-4/10 knee  PRECAUTIONS: Knee  RED FLAGS: Bowel or bladder incontinence: Yes: bowel     WEIGHT BEARING RESTRICTIONS: No  FALLS:  Has patient fallen in last 6 months? 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  NEXT MD VISIT: Next month- Dr. Magnus Ivan  OBJECTIVE:  Note: Objective measures were  completed at Evaluation unless otherwise noted.  DIAGNOSTIC FINDINGS:  Narrative & Impression  CLINICAL DATA:  Status post right knee replacement.   EXAM: PORTABLE RIGHT KNEE - 1-2 VIEW   COMPARISON:  06/22/2023   FINDINGS: Right knee arthroplasty in expected alignment. No periprosthetic lucency or fracture. There has been patellar resurfacing. Recent postsurgical change includes air and edema in the soft tissues and joint space. Chronic ghost tracks in the distal femur. Anterior skin staples in place.   IMPRESSION: Right knee arthroplasty without immediate postoperative complication.     Electronically Signed   By: Narda Rutherford M.D.   On: 09/19/2023 16:43    PATIENT SURVEYS:  FOTO 39 with goal of 54  COGNITION: Overall cognitive status: Within functional limits for tasks assessed     SENSATION: Light touch: Impaired - reported dead zone lateral joint line  EDEMA:  Circumferential:  1)bottom of incision=38; 2)10 cm above distal incision-=48, 3) top of incision= 49    POSTURE: No Significant postural limitations  PALPATION: Global tenderness along right knee- joint line and posterior knee  LOWER EXTREMITY ROM:  Active ROM Right eval Left eval  Hip flexion    Hip extension    Hip abduction    Hip adduction  Hip internal rotation    Hip external rotation    Knee flexion 90deg   Knee extension Lack 12 deg   Ankle dorsiflexion    Ankle plantarflexion    Ankle inversion    Ankle eversion     (Blank rows = not tested)    TODAY'S TREATMENT: DATE: 12/21/23    TherEx:  Standing forward lunges, R LE leading,  at stairs on the second step, 15 sec holds, x8 Standing hamstring stretch with R LE on third step, 30 sec holds, x4  Supine SAQ with blue bolster under knee, 6# AW donned, 2x10 each LE Supine SLR with 6# AW's donned, 2x10 each LE with some noticeable extension lag on the R LE, so towel roll placed under heel  Seated leg press, 115# x10;  145# 2x10 Seated knee extension at Matrix, 12.5#, 3x10 Seated hamstring curl at Matrix, 12.5#, 3x10   Manual:   Grade 2-3 tibiofemoral mobs PA/AP in sitting x 3 min. Manual stretching into Right knee flex x several min (patient denied any pain just tightness)  Measured Right knee AROM (Seated ) = 108 deg      PATIENT EDUCATION:  Education details: Purpose of PT, HEP, Pain management strategies including Ice, ROM, taking meds as prescribed. Pt educated throughout session about proper posture and technique with exercises. Improved exercise technique, movement at target joints, use of target muscles after min to mod verbal, visual, tactile cues.   Person educated: Patient Education method: Explanation, Demonstration, Tactile cues, Verbal cues, and Handouts Education comprehension: verbalized understanding, returned demonstration, verbal cues required, tactile cues required, and needs further education  HOME EXERCISE PROGRAM: Access Code: [email or web address] URL: [email or web address] Date: 10/25/2023 Prepared by: Maureen Ralphs  Exercises - Standing Hip Flexion March  - 1 x daily - 7 x weekly - 3 sets - 10 reps - Standing Hip Abduction with Counter Support  - 1 x daily - 7 x weekly - 3 sets - 10 reps - Standing Hip Extension with Counter Support  - 1 x daily - 7 x weekly - 3 sets - 10 reps - Standing Knee Flexion with Unilateral Counter Support  - 1 x daily - 3 sets - 10 reps - Mini Squat with Counter Support  - 1 x daily - 3 sets - 10 reps - Heel Toe Raises with Unilateral Counter Support  - 1 x daily - 1 x weekly - 3 sets - 10 reps - Toe Raises with Counter Support  - 1 x daily - 1 x weekly - 3 sets - 10 reps  Access Code: [facility] URL: [email or web address] Date: 10/11/2023 Prepared by: Maureen Ralphs  Exercises - Seated Hamstring Stretch  - 2 x daily - 3-5 sets - 30-45 hold - Seated Knee Flexion Stretch  - 2 x daily - 3-5 sets - 30-45  hold  ASSESSMENT:  CLINICAL IMPRESSION:  Pt with increased stiffness in the R LE, so focus was placed on assisting with mobility of the knee.  Pt responded well with increased ROM from start of session (102 deg) to conclusion of session (114 deg).  Pt will continue to perform exercises that increase ROM while also strengthening the surrounding musculature.  Pt overall doing well and will continue to benefit from skilled therapy to address remaining deficits in order to improve overall QoL and return to PLOF.      OBJECTIVE IMPAIRMENTS: Abnormal gait, decreased activity tolerance, decreased balance, decreased endurance, decreased mobility, difficulty walking, decreased ROM, decreased strength, hypomobility, impaired flexibility, and  pain.   ACTIVITY LIMITATIONS: carrying, lifting, bending, sitting, standing, squatting, sleeping, stairs, transfers, bed mobility, and toileting  PARTICIPATION LIMITATIONS: meal prep, cleaning, laundry, driving, shopping, community activity, occupation, and yard work  PERSONAL FACTORS: 1-2 comorbidities: chronic pain, HTN  are also affecting patient's functional outcome.   REHAB POTENTIAL: Good  CLINICAL DECISION MAKING: Stable/uncomplicated  EVALUATION COMPLEXITY: Low   GOALS: Goals reviewed with patient? Yes  SHORT TERM GOALS: Target date: 11/08/2023 Pt will be independent with HEP in order to decrease ankle pain and increase strength in order to improve pain-free function at home and work.  Baseline: EVAL- No formal HEP in place; 12/10: independent  Goal status: ACHIEVED    LONG TERM GOALS: Target date: 12/06/2023  Pt will decrease worst pain as reported on NPRS by at least 3 points in order to demonstrate clinically significant reduction in ankle/foot pain.  Baseline: EVAL- R knee pain = 7-8/10; 12/05/23: 6-7/10 worst pain  12/07/23: 6-7 over past 7 days Goal status: ONGOING  2.  Pt will improve FOTO to target score of 54 to display perceived  improvements in ability to complete ADL's.  Baseline: EVAL= 39;  12/05/23: 58 12/07/23: 49 Goal status: MET  3.  Patient will improve Right knee AROM extension to <5 deg from zero for improved ability to straighten his leg with all mobility.  Baseline: EVAL= 12 deg; 12/10: ~10 degrees but still quite edematous 12/07/23: 3 deg lacking Goal status: ONGOING  4.  Patient will improve Right knee AROM Flexion to > 120 deg for optimal right knee mobility with steps and sitting.  Baseline: EVAL= 90 deg; 12/05/23:  12/07/23: 109 deg Goal status: ONGOING  5.  Patient (> 11 years old) will complete five times sit to stand test in < 15 seconds indicating an increased LE strength and improved balance. Baseline: 11/14: 31.64 sec no UE support  12/07/23: 22.91 sec with no UE Goal status: ONGOING   6.  Patient will reduce timed up and go to <11 seconds to reduce fall risk and demonstrate improved transfer/gait ability. Baseline: 11/14 18.96 sec with SPC 12/07/23: 16.52 sec without AD; 12.76 sec with SPC Goal status: INITIAL  7.  Patient will increase 10 meter walk test to >1.52m/s as to improve gait speed for better community ambulation and to reduce fall risk. Baseline: 11:14 0.90m/s 12/07/23: 0.96 m/s Goal status: ONGOING    PLAN:  PT FREQUENCY: 1-2x/week  PT DURATION: 8 weeks  PLANNED INTERVENTIONS: 97164- PT Re-evaluation, 97110-Therapeutic exercises, 97530- Therapeutic activity, 97112- Neuromuscular re-education, 97535- Self Care, 16109- Manual therapy, [id number]- Gait training, [phone]- Orthotic Fit/training, [phone]- Electrical stimulation (manual), [phone]- Ultrasound, Patient/Family education, Balance training, Stair training, Taping, Dry Needling, Joint mobilization, Manual lymph drainage, Scar mobilization, Compression bandaging, Cryotherapy, and Moist heat  PLAN FOR NEXT SESSION:   Manual therapy for ROM, TE for strengthening and knee stability, and continue with dynamic balance  activities.     Nolon Bussing, PT, DPT Physical Therapist - [facility]  12/21/23, 10:16 AM

## 2023-12-25 DIAGNOSIS — Z961 Presence of intraocular lens: Secondary | ICD-10-CM | POA: Diagnosis not present

## 2023-12-25 DIAGNOSIS — Z7689 Persons encountering health services in other specified circumstances: Secondary | ICD-10-CM | POA: Diagnosis not present

## 2023-12-25 DIAGNOSIS — H43813 Vitreous degeneration, bilateral: Secondary | ICD-10-CM | POA: Diagnosis not present

## 2023-12-25 DIAGNOSIS — H359 Unspecified retinal disorder: Secondary | ICD-10-CM | POA: Diagnosis not present

## 2023-12-25 NOTE — Therapy (Signed)
 OUTPATIENT PHYSICAL THERAPY TREATMENT   Patient Name: Harold Smith MRN: [id number] DOB:15-Jun-1966, 57 y.o., male Today's Date: 12/26/2023  END OF SESSION:  PT End of Session - 12/26/23 0848     Visit Number 15    Number of Visits 26    Date for PT Re-Evaluation 01/31/24    Authorization Type [employer] 2024    Authorization - Number of Visits 30    Progress Note Due on Visit 20    PT Start Time 0848    PT Stop Time 0928    PT Time Calculation (min) 40 min    Equipment Utilized During Treatment Gait belt    Activity Tolerance Patient tolerated treatment well;No increased pain    Behavior During Therapy WFL for tasks assessed/performed              Past Medical History:  Diagnosis Date   Allergic rhinitis due to pollen    Anemia    Anxiety    Chronic pain    Colon polyps    Depression    Diverticulitis large intestine    Erosive esophagitis    GERD (gastroesophageal reflux disease)    HTN (hypertension)    Irritable bowel syndrome    Metabolic bone disease 09/01/2022   Obesity    Osteoarthritis, knee    Sleep apnea    does not use cpap, pt states he no longer needed CPAP due to weight loss   Sleep disturbance    Vitamin D  deficiency 09/01/2022   Past Surgical History:  Procedure Laterality Date   ABDOMINAL EXPOSURE N/A 03/06/2023   Procedure: ABDOMINAL EXPOSURE;  Surgeon: Gretta Lonni PARAS, MD;  Location: [facility] OR;  Service: Vascular;  Laterality: N/A;   ACHILLES TENDON REPAIR Right 12/26/2006   ANTERIOR LUMBAR FUSION N/A 03/06/2023   Procedure: L5-S1 ANTERIOR LUMBAR FUSION 1 LEVEL;  Surgeon: Georgina Ozell LABOR, MD;  Location: [facility] OR;  Service: Orthopedics;  Laterality: N/A;   BIOPSY  08/29/2023   Procedure: BIOPSY;  Surgeon: Unk Corinn Skiff, MD;  Location: [facility] ENDOSCOPY;  Service: Gastroenterology;;   CATARACT EXTRACTION W/PHACO Left 07/20/2021   Procedure: CATARACT EXTRACTION PHACO AND INTRAOCULAR LENS PLACEMENT (IOC) LEFT 2.11  00:24.0;  Surgeon: Jaye Fallow, MD;  Location: [facility] SURGERY CNTR;  Service: Ophthalmology;  Laterality: Left;  sleep apnea   CATARACT EXTRACTION W/PHACO Right 08/03/2021   Procedure: CATARACT EXTRACTION PHACO AND INTRAOCULAR LENS PLACEMENT (IOC) RIGHT;  Surgeon: Jaye Fallow, MD;  Location: [facility] SURGERY [facility]TR;  Service: Ophthalmology;  Laterality: Right;  3.19 0:33.0   CHOLECYSTECTOMY     COLON RESECTION Left 12/26/2004   due to diverticular disease at [facility]   COLONOSCOPY  2013?   COLONOSCOPY N/A 11/30/2022   Procedure: COLONOSCOPY;  Surgeon: Unk Corinn Skiff, MD;  Location: [facility] ENDOSCOPY;  Service: Gastroenterology;  Laterality: N/A;   COLONOSCOPY WITH PROPOFOL  N/A 05/22/2018   Procedure: COLONOSCOPY WITH PROPOFOL ;  Surgeon: Unk Corinn Skiff, MD;  Location: [facility] ENDOSCOPY;  Service: Gastroenterology;  Laterality: N/A;   COLONOSCOPY WITH PROPOFOL  N/A 10/22/2020   Procedure: COLONOSCOPY WITH PROPOFOL ;  Surgeon: Unk Corinn Skiff, MD;  Location: [facility] SURGERY CNTR;  Service: Endoscopy;  Laterality: N/A;  priority 4   COLONOSCOPY WITH PROPOFOL  N/A 11/10/2021   Procedure: COLONOSCOPY WITH PROPOFOL ;  Surgeon: Unk Corinn Skiff, MD;  Location: [facility] ENDOSCOPY;  Service: Gastroenterology;  Laterality: N/A;   COLONOSCOPY WITH PROPOFOL  N/A 06/16/2022   Procedure: COLONOSCOPY WITH PROPOFOL ;  Surgeon: Unk Corinn Skiff, MD;  Location: [facility] SURGERY CNTR;  Service: Endoscopy;  Laterality: N/A;   COLONOSCOPY WITH PROPOFOL  N/A 11/29/2022   Procedure: COLONOSCOPY WITH PROPOFOL ;  Surgeon: Unk Corinn Skiff, MD;  Location: [facility] ENDOSCOPY;  Service: Gastroenterology;  Laterality: N/A;   DRUG INDUCED ENDOSCOPY N/A 06/10/2019   Procedure: DRUG INDUCED SLEEP ENDOSCOPY;  Surgeon: Mable Lenis, MD;  Location: [facility];  Service: ENT;  Laterality: N/A;   ESOPHAGOGASTRODUODENOSCOPY (EGD) WITH PROPOFOL  N/A 06/16/2022   Procedure: ESOPHAGOGASTRODUODENOSCOPY (EGD) WITH  PROPOFOL ;  Surgeon: Unk Corinn Skiff, MD;  Location: [facility] SURGERY CNTR;  Service: Endoscopy;  Laterality: N/A;   ESOPHAGOGASTRODUODENOSCOPY (EGD) WITH PROPOFOL  N/A 05/24/2023   Procedure: ESOPHAGOGASTRODUODENOSCOPY (EGD) WITH PROPOFOL ;  Surgeon: Unk Corinn Skiff, MD;  Location: [facility] ENDOSCOPY;  Service: Gastroenterology;  Laterality: N/A;   ESOPHAGOGASTRODUODENOSCOPY (EGD) WITH PROPOFOL  N/A 08/29/2023   Procedure: ESOPHAGOGASTRODUODENOSCOPY (EGD) WITH PROPOFOL ;  Surgeon: Unk Corinn Skiff, MD;  Location: [facility] ENDOSCOPY;  Service: Gastroenterology;  Laterality: N/A;   GASTRIC BYPASS N/A 12/27/2007   HARDWARE REMOVAL Right 06/22/2023   Procedure: HARDWARE REMOVAL;  Surgeon: Celena Sharper, MD;  Location: [facility] OR;  Service: Orthopedics;  Laterality: Right;   HIATAL HERNIA REPAIR  09/25/2014   at [facility]   KNEE ARTHROSCOPY Right 06/22/2023   Procedure: ARTHROSCOPY KNEE;  Surgeon: Celena Sharper, MD;  Location: [facility] OR;  Service: Orthopedics;  Laterality: Right;   ORIF FEMUR FRACTURE Right 08/30/2022   Procedure: OPEN REDUCTION INTERNAL FIXATION (ORIF) DISTAL FEMUR FRACTURE;  Surgeon: Celena Sharper, MD;  Location: [facility] OR;  Service: Orthopedics;  Laterality: Right;   POLYPECTOMY  10/22/2020   Procedure: POLYPECTOMY;  Surgeon: Unk Corinn Skiff, MD;  Location: [facility]ERY [facility];  Service: Endoscopy;;   POLYPECTOMY  06/16/2022   Procedure: POLYPECTOMY;  Surgeon: Unk Corinn Skiff, MD;  Location: [facility] SURGERY CNTR;  Service: Endoscopy;;   ROTATOR CUFF REPAIR Right    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   TOTAL KNEE ARTHROPLASTY Right 09/19/2023   Procedure: RIGHT TOTAL KNEE ARTHROPLASTY;  Surgeon: Vernetta Lonni GRADE, MD;  Location: [facility] OR;  Service: Orthopedics;  Laterality: Right;   Patient Active Problem List   Diagnosis Date Noted   Secondary hyperparathyroidism, non-renal (HCC) 10/25/2023   Age-related osteoporosis without current pathological fracture 10/23/2023    S/P total knee arthroplasty, right 09/19/2023   Status post total right knee replacement 09/19/2023   Chronic pain syndrome 07/12/2023   Anxiety 09/01/2022   Vitamin D  deficiency 09/01/2022   Metabolic bone disease  09/01/2022   Displaced supracondylar fracture of distal end of right femur without intracondylar extension (HCC) 08/29/2022   Mood disorder (HCC) 07/08/2022   Disability of walking 06/01/2022   Degeneration of lumbar intervertebral disc 06/01/2022   Iron  deficiency anemia 05/10/2022   Lumbar spondylolysis 02/25/2022   Sleep disturbance 07/02/2021   Preventative health care 06/23/2018   Personal hx of gastric bypass 06/23/2018   Erectile dysfunction 06/23/2018   Hx of adenomatous colonic polyps    Essential hypertension 10/17/2013   Diverticulosis large intestine w/o perforation or abscess w/bleeding    Colon polyps     PCP: Dr. Charlie Denise  REFERRING PROVIDER: Dr. Lonni Vernetta  REFERRING DIAG: [id number] (ICD-10-CM) - Status post total right knee replacement   THERAPY DIAG:  Muscle weakness (generalized)  Limited joint range of motion (ROM)  Difficulty in walking, not elsewhere classified  Abnormality of gait and mobility  Rationale for Evaluation and Treatment: Rehabilitation  ONSET DATE: 09/19/2023  SUBJECTIVE:   SUBJECTIVE STATEMENT:   Pt reports 6/10 pain in  the knee.  Patient reports stiffness in the knee   PERTINENT HISTORY:  Per ortho note from Dr. Vernetta: Mirian Georgena Dines, 57 y.o. male, has a history of pain and functional disability in the right knee due to trauma and has failed non-surgical conservative treatments for greater than 12 weeks to include NSAID's and/or analgesics, corticosteriod injections, use of assistive devices, and activity modification.  Onset of symptoms was abrupt, starting 1 years ago with gradually worsening course since that time. The patient noted prior procedures on the knee to include  ORIF  on the right  knee(s).  Per previous admission chart review from Camie Dine, PT, DPT: Relevant past medical history and comorbidities include s/p hardware removal and arthroscopy of R knee on 06/22/2023 (hardware originally for distal medial condyle fracture);  s/p L5-S1 ANTERIOR LUMBAR FUSION on 03/06/2023; Diverticulosis large intestine w/o perforation or abscess w/bleeding; Essential hypertension; Rectal bleeding; Hx of adenomatous colonic polyps; Personal hx of gastric bypass; Erectile dysfunction; Sleep disturbance; Lumbar spondylolysis; Microcytic anemia; Iron  deficiency anemia; Numbness and tingling; Imbalance; Difficulty sleeping; Disability of walking; Pain in joint involving ankle and foot; Degeneration of lumbar intervertebral disc; Erosive esophagitis; Mood disorder (HCC); Displaced supracondylar fracture of distal end of right femur without intracondylar extension (HCC); Anxiety; Vitamin D  deficiency; Metabolic bone disease; and Radiculopathy, lumbar region. Please see above for relevant surgery history; osteoporosis, bowel/urinary incontinence, stumbling and dropping things. Patient denies hx of cancer, stroke, seizures, lung problems, heart problems, diabetes, and unexplained weight loss.   PAIN:  Are you having pain? Yes 3-4/10 knee  PRECAUTIONS: Knee  RED FLAGS: Bowel or bladder incontinence: Yes: bowel     WEIGHT BEARING RESTRICTIONS: No  FALLS:  Has patient fallen in last 6 months? 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  NEXT MD VISIT: Next month- Dr. Vernetta  OBJECTIVE:  Note: Objective measures were completed at Evaluation unless  otherwise noted.  DIAGNOSTIC FINDINGS:  Narrative & Impression  CLINICAL DATA:  Status post right knee replacement.   EXAM: PORTABLE RIGHT KNEE - 1-2 VIEW   COMPARISON:  06/22/2023   FINDINGS: Right knee arthroplasty in expected alignment. No periprosthetic lucency or fracture. There has been patellar resurfacing. Recent postsurgical change includes air and edema in the soft tissues and joint space. Chronic ghost tracks in the distal femur. Anterior skin staples in place.   IMPRESSION: Right knee arthroplasty without immediate postoperative complication.     Electronically Signed   By: Andrea Gasman M.D.   On: 09/19/2023 16:43    PATIENT SURVEYS:  FOTO 39 with goal of 54  COGNITION: Overall cognitive status: Within functional limits for tasks assessed     SENSATION: Light touch: Impaired - reported dead zone lateral joint line  EDEMA:  Circumferential:  1)bottom of incision=38; 2)10 cm above distal incision-=48, 3) top of incision= 49    POSTURE: No Significant postural limitations  PALPATION: Global tenderness along right knee- joint line and posterior knee  LOWER EXTREMITY ROM:  Active ROM Right eval Left eval  Hip flexion    Hip extension    Hip abduction    Hip adduction    Hip internal rotation  Hip external rotation    Knee flexion 90deg   Knee extension Lack 12 deg   Ankle dorsiflexion    Ankle plantarflexion    Ankle inversion    Ankle eversion     (Blank rows = not tested)    TODAY'S TREATMENT: DATE: 12/26/23    TherEx: Octane level 3 x 6 minutes   Standing forward lunges, R LE leading,  at stairs on the second step, 15 sec holds, x8  Supine SAQ with blue bolster under knee, 6# AW donned, 2x10 each LE Seated LAQ with 6# AW donned 2 x 10 each LE  Sit to stand with L foot on airex pad 2 x 10 with no UE support  Standing R TKE 2 x 10 with GTB    Manual:   Supine R distal femur mobs AP and manual stretching with ankle  propped on blue bolster x 10 minutes  R knee extension AROM = lacking 4 degrees     PATIENT EDUCATION:  Education details: Purpose of PT, HEP, Pain management strategies including Ice, ROM, taking meds as prescribed. Pt educated throughout session about proper posture and technique with exercises. Improved exercise technique, movement at target joints, use of target muscles after min to mod verbal, visual, tactile cues.   Person educated: Patient Education method: Explanation, Demonstration, Tactile cues, Verbal cues, and Handouts Education comprehension: verbalized understanding, returned demonstration, verbal cues required, tactile cues required, and needs further education  HOME EXERCISE PROGRAM: Access Code: [email or web address] URL: [email or web address] Date: 10/25/2023 Prepared by: Reyes London  Exercises - Standing Hip Flexion March  - 1 x daily - 7 x weekly - 3 sets - 10 reps - Standing Hip Abduction with Counter Support  - 1 x daily - 7 x weekly - 3 sets - 10 reps - Standing Hip Extension with Counter Support  - 1 x daily - 7 x weekly - 3 sets - 10 reps - Standing Knee Flexion with Unilateral Counter Support  - 1 x daily - 3 sets - 10 reps - Mini Squat with Counter Support  - 1 x daily - 3 sets - 10 reps - Heel Toe Raises with Unilateral Counter Support  - 1 x daily - 1 x weekly - 3 sets - 10 reps - Toe Raises with Counter Support  - 1 x daily - 1 x weekly - 3 sets - 10 reps  Access Code: [facility] URL: [email or web address] Date: 10/11/2023 Prepared by: Reyes London  Exercises - Seated Hamstring Stretch  - 2 x daily - 3-5 sets - 30-45 hold - Seated Knee Flexion Stretch  - 2 x daily - 3-5 sets - 30-45 hold  ASSESSMENT:  CLINICAL IMPRESSION:   Patient presents to therapy session motivated to participate despite soreness and pain in R knee. Session focused on stretching and R quad strengthening. Tolerated treatment session well with no  complaints of increased pain. Patient will continue to benefit from skilled therapy to address remaining deficits in order to improve quality of life and return to PLOF.     OBJECTIVE IMPAIRMENTS: Abnormal gait, decreased activity tolerance, decreased balance, decreased endurance, decreased mobility, difficulty walking, decreased ROM, decreased strength, hypomobility, impaired flexibility, and pain.   ACTIVITY LIMITATIONS: carrying, lifting, bending, sitting, standing, squatting, sleeping, stairs, transfers, bed mobility, and toileting  PARTICIPATION LIMITATIONS: meal prep, cleaning, laundry, driving, shopping, community activity, occupation, and yard work  PERSONAL FACTORS: 1-2 comorbidities: chronic pain, HTN  are also affecting patient's functional outcome.   REHAB POTENTIAL: Good  CLINICAL DECISION MAKING: Stable/uncomplicated  EVALUATION COMPLEXITY: Low   GOALS: Goals reviewed with patient? Yes  SHORT TERM GOALS: Target date: 11/08/2023 Pt will be independent with HEP in order to decrease ankle pain and increase strength in order to improve pain-free function at home and work.  Baseline: EVAL- No formal HEP in place; 12/10: independent  Goal status: ACHIEVED    LONG TERM GOALS: Target date: 12/06/2023  Pt will decrease worst pain as reported on NPRS by at least 3 points in order to demonstrate clinically significant reduction in ankle/foot pain.  Baseline: EVAL- R knee pain = 7-8/10; 12/05/23: 6-7/10 worst pain  12/07/23: 6-7 over past 7 days Goal status: ONGOING  2.  Pt will improve FOTO to target score of 54 to display perceived improvements in ability to complete ADL's.  Baseline: EVAL= 39;  12/05/23: 58 12/07/23: 49 Goal status: MET  3.  Patient will improve Right knee AROM extension to <5 deg from zero for improved ability to straighten his leg with all mobility.  Baseline: EVAL= 12 deg; 12/10: ~10 degrees but still quite edematous 12/07/23: 3 deg lacking Goal  status: ONGOING  4.  Patient will improve Right knee AROM Flexion to > 120 deg for optimal right knee mobility with steps and sitting.  Baseline: EVAL= 90 deg; 12/05/23:  12/07/23: 109 deg Goal status: ONGOING  5.  Patient (> 64 years old) will complete five times sit to stand test in < 15 seconds indicating an increased LE strength and improved balance. Baseline: 11/14: 31.64 sec no UE support  12/07/23: 22.91 sec with no UE Goal status: ONGOING   6.  Patient will reduce timed up and go to <11 seconds to reduce fall risk and demonstrate improved transfer/gait ability. Baseline: 11/14 18.96 sec with SPC 12/07/23: 16.52 sec without AD; 12.76 sec with SPC Goal status: INITIAL  7.  Patient will increase 10 meter walk test to >1.52m/s as to improve gait speed for better community ambulation and to reduce fall risk. Baseline: 11:14 0.56m/s 12/07/23: 0.96 m/s Goal status: ONGOING    PLAN:  PT FREQUENCY: 1-2x/week  PT DURATION: 8 weeks  PLANNED INTERVENTIONS: 97164- PT Re-evaluation, 97110-Therapeutic exercises, 97530- Therapeutic activity, 97112- Neuromuscular re-education, 97535- Self Care, [id number]- Manual therapy, [id number]- Gait training, [phone]- Orthotic Fit/training, [phone]- Electrical stimulation (manual), [phone]- Ultrasound, Patient/Family education, Balance training, Stair training, Taping, Dry Needling, Joint mobilization, Manual lymph drainage, Scar mobilization, Compression bandaging, Cryotherapy, and Moist heat  PLAN FOR NEXT SESSION:   Manual therapy for ROM, TE for strengthening and knee stability, and continue with dynamic balance activities.     Maryanne Finder, PT, DPT Physical Therapist - [facility]  12/26/23, 8:48 AM

## 2023-12-26 ENCOUNTER — Ambulatory Visit: Payer: 59

## 2023-12-26 ENCOUNTER — Ambulatory Visit: Admit: 2023-12-26 | Payer: 59 | Admitting: Orthopedic Surgery

## 2023-12-26 DIAGNOSIS — R262 Difficulty in walking, not elsewhere classified: Secondary | ICD-10-CM

## 2023-12-26 DIAGNOSIS — Z7689 Persons encountering health services in other specified circumstances: Secondary | ICD-10-CM | POA: Diagnosis not present

## 2023-12-26 DIAGNOSIS — M2560 Stiffness of unspecified joint, not elsewhere classified: Secondary | ICD-10-CM

## 2023-12-26 DIAGNOSIS — R269 Unspecified abnormalities of gait and mobility: Secondary | ICD-10-CM

## 2023-12-26 DIAGNOSIS — M6281 Muscle weakness (generalized): Secondary | ICD-10-CM | POA: Diagnosis not present

## 2023-12-26 SURGERY — REMOVAL OF ANTERIOR SPINAL INSTRUMENTATION
Anesthesia: General | Site: Spine Lumbar

## 2023-12-27 DIAGNOSIS — Z419 Encounter for procedure for purposes other than remedying health state, unspecified: Secondary | ICD-10-CM | POA: Diagnosis not present

## 2023-12-28 ENCOUNTER — Ambulatory Visit: Payer: Medicaid Other | Attending: Orthopaedic Surgery

## 2023-12-28 DIAGNOSIS — M25561 Pain in right knee: Secondary | ICD-10-CM | POA: Diagnosis present

## 2023-12-28 DIAGNOSIS — R262 Difficulty in walking, not elsewhere classified: Secondary | ICD-10-CM | POA: Insufficient documentation

## 2023-12-28 DIAGNOSIS — M6281 Muscle weakness (generalized): Secondary | ICD-10-CM | POA: Insufficient documentation

## 2023-12-28 DIAGNOSIS — R269 Unspecified abnormalities of gait and mobility: Secondary | ICD-10-CM | POA: Insufficient documentation

## 2023-12-28 DIAGNOSIS — M25661 Stiffness of right knee, not elsewhere classified: Secondary | ICD-10-CM | POA: Insufficient documentation

## 2023-12-28 DIAGNOSIS — M2560 Stiffness of unspecified joint, not elsewhere classified: Secondary | ICD-10-CM | POA: Insufficient documentation

## 2023-12-28 DIAGNOSIS — Z7689 Persons encountering health services in other specified circumstances: Secondary | ICD-10-CM | POA: Diagnosis not present

## 2023-12-28 NOTE — Therapy (Signed)
 OUTPATIENT PHYSICAL THERAPY TREATMENT   Patient Name: Harold Smith MRN: [id number] DOB:[birth year], 58 y.o., male Today's Date: 12/28/2023  END OF SESSION:  PT End of Session - 12/28/23 1449     Visit Number 16    Number of Visits 26    Date for PT Re-Evaluation 01/31/24    Authorization Type [employer] [id number]    Authorization - Number of Visits 30    Progress Note Due on Visit 20    PT Start Time 1446    PT Stop Time 1526    PT Time Calculation (min) 40 min    Equipment Utilized During Treatment Gait belt    Activity Tolerance Patient tolerated treatment well;No increased pain    Behavior During Therapy WFL for tasks assessed/performed               Past Medical History:  Diagnosis Date   Allergic rhinitis due to pollen    Anemia    Anxiety    Chronic pain    Colon polyps    Depression    Diverticulitis large intestine    Erosive esophagitis    GERD (gastroesophageal reflux disease)    HTN (hypertension)    Irritable bowel syndrome    Metabolic bone disease 09/01/2022   Obesity    Osteoarthritis, knee    Sleep apnea    does not use cpap, pt states he no longer needed CPAP due to weight loss   Sleep disturbance    Vitamin D  deficiency 09/01/2022   Past Surgical History:  Procedure Laterality Date   ABDOMINAL EXPOSURE N/A 03/06/2023   Procedure: ABDOMINAL EXPOSURE;  Surgeon: Gretta Lonni PARAS, MD;  Location: [facility] OR;  Service: Vascular;  Laterality: N/A;   ACHILLES TENDON REPAIR Right 12/26/2006   ANTERIOR LUMBAR FUSION N/A 03/06/2023   Procedure: L5-S1 ANTERIOR LUMBAR FUSION 1 LEVEL;  Surgeon: Georgina Ozell LABOR, MD;  Location: [facility] OR;  Service: Orthopedics;  Laterality: N/A;   BIOPSY  08/29/2023   Procedure: BIOPSY;  Surgeon: Unk Corinn Skiff, MD;  Location: [facility] ENDOSCOPY;  Service: Gastroenterology;;   CATARACT EXTRACTION W/PHACO Left 07/20/2021   Procedure: CATARACT EXTRACTION PHACO AND INTRAOCULAR LENS PLACEMENT (IOC) LEFT 2.11  00:24.0;  Surgeon: Jaye Fallow, MD;  Location: [facility]ERY [facility]TR;  Service: Ophthalmology;  Laterality: Left;  sleep apnea   CATARACT EXTRACTION W/PHACO Right 08/03/2021   Procedure: CATARACT EXTRACTION PHACO AND INTRAOCULAR LENS PLACEMENT (IOC) RIGHT;  Surgeon: Jaye Fallow, MD;  Location: [facility];  Service: Ophthalmology;  Laterality: Right;  3.19 0:33.0   CHOLECYSTECTOMY     COLON RESECTION Left 12/26/2004   due to diverticular disease at [facility]   COLONOSCOPY  2013?   COLONOSCOPY N/A 11/30/2022   Procedure: COLONOSCOPY;  Surgeon: Unk Corinn Skiff, MD;  Location: [facility] ENDOSCOPY;  Service: Gastroenterology;  Laterality: N/A;   COLONOSCOPY WITH PROPOFOL  N/A 05/22/2018   Procedure: COLONOSCOPY WITH PROPOFOL ;  Surgeon: Unk Corinn Skiff, MD;  Location: [facility] ENDOSCOPY;  Service: Gastroenterology;  Laterality: N/A;   COLONOSCOPY WITH PROPOFOL  N/A 10/22/2020   Procedure: COLONOSCOPY WITH PROPOFOL ;  Surgeon: Unk Corinn Skiff, MD;  Location: [facility]ERY [facility];  Service: Endoscopy;  Laterality: N/A;  priority 4   COLONOSCOPY WITH PROPOFOL  N/A 11/10/2021   Procedure: COLONOSCOPY WITH PROPOFOL ;  Surgeon: Unk Corinn Skiff, MD;  Location: [facility] ENDOSCOPY;  Service: Gastroenterology;  Laterality: N/A;   COLONOSCOPY WITH PROPOFOL  N/A 06/16/2022   Procedure: COLONOSCOPY WITH PROPOFOL ;  Surgeon: Unk Corinn Skiff, MD;  Location: [facility]ERY [facility];  Service: Endoscopy;  Laterality: N/A;   COLONOSCOPY WITH PROPOFOL  N/A 11/29/2022   Procedure: COLONOSCOPY WITH PROPOFOL ;  Surgeon: Unk Corinn Skiff, MD;  Location: [facility] ENDOSCOPY;  Service: Gastroenterology;  Laterality: N/A;   DRUG INDUCED ENDOSCOPY N/A 06/10/2019   Procedure: DRUG INDUCED SLEEP ENDOSCOPY;  Surgeon: Mable Lenis, MD;  Location: [facility];  Service: ENT;  Laterality: N/A;   ESOPHAGOGASTRODUODENOSCOPY (EGD) WITH PROPOFOL  N/A 06/16/2022   Procedure: ESOPHAGOGASTRODUODENOSCOPY (EGD) WITH  PROPOFOL ;  Surgeon: Unk Corinn Skiff, MD;  Location: [facility] SUR[facility]ERY [facility];  Service: Endoscopy;  Laterality: N/A;   ESOPHAGOGASTRODUODENOSCOPY (EGD) WITH PROPOFOL  N/A 05/24/2023   Procedure: ESOPHAGOGASTRODUODENOSCOPY (EGD) WITH PROPOFOL ;  Surgeon: Unk Corinn Skiff, MD;  Location: [facility] ENDOSCOPY;  Service: Gastroenterology;  Laterality: N/A;   ESOPHAGOGASTRODUODENOSCOPY (EGD) WITH PROPOFOL  N/A 08/29/2023   Procedure: ESOPHAGOGASTRODUODENOSCOPY (EGD) WITH PROPOFOL ;  Surgeon: Unk Corinn Skiff, MD;  Location: [facility] ENDOSCOPY;  Service: Gastroenterology;  Laterality: N/A;   GASTRIC BYPASS N/A 12/27/2007   HARDWARE REMOVAL Right 06/22/2023   Procedure: HARDWARE REMOVAL;  Surgeon: Celena Sharper, MD;  Location: [facility] OR;  Service: Orthopedics;  Laterality: Right;   HIATAL HERNIA REPAIR  09/25/2014   at [facility]   KNEE ARTHROSCOPY Right 06/22/2023   Procedure: ARTHROSCOPY KNEE;  Surgeon: Celena Sharper, MD;  Location: [facility] OR;  Service: Orthopedics;  Laterality: Right;   ORIF FEMUR FRACTURE Right 08/30/2022   Procedure: OPEN REDUCTION INTERNAL FIXATION (ORIF) DISTAL FEMUR FRACTURE;  Surgeon: Celena Sharper, MD;  Location: [facility] OR;  Service: Orthopedics;  Laterality: Right;   POLYPECTOMY  10/22/2020   Procedure: POLYPECTOMY;  Surgeon: Unk Corinn Skiff, MD;  Location: [facility];  Service: Endoscopy;;   POLYPECTOMY  06/16/2022   Procedure: POLYPECTOMY;  Surgeon: Unk Corinn Skiff, MD;  Location: [facility]ERY [facility];  Service: Endoscopy;;   ROTATOR CUFF REPAIR Right    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   TOTAL KNEE ARTHROPLASTY Right 09/19/2023   Procedure: RIGHT TOTAL KNEE ARTHROPLASTY;  Surgeon: Vernetta Lonni GRADE, MD;  Location: [facility] OR;  Service: Orthopedics;  Laterality: Right;   Patient Active Problem List   Diagnosis Date Noted   Secondary hyperparathyroidism, non-renal (HCC) 10/25/2023   Age-related osteoporosis without current pathological fracture 10/23/2023    S/P total knee arthroplasty, right 09/19/2023   Status post total right knee replacement 09/19/2023   Chronic pain syndrome 07/12/2023   Anxiety 09/01/2022   Vitamin D  deficiency 09/01/2022   Metabolic bone disease  09/01/2022   Displaced supracondylar fracture of distal end of right femur without intracondylar extension (HCC) 08/29/2022   Mood disorder (HCC) 07/08/2022   Disability of walking 06/01/2022   Degeneration of lumbar intervertebral disc 06/01/2022   Iron  deficiency anemia 05/10/2022   Lumbar spondylolysis 02/25/2022   Sleep disturbance 07/02/2021   Preventative health care 06/23/2018   Personal hx of gastric bypass 06/23/2018   Erectile dysfunction 06/23/2018   Hx of adenomatous colonic polyps    Essential hypertension 10/17/2013   Diverticulosis large intestine w/o perforation or abscess w/bleeding    Colon polyps     PCP: Dr. Charlie Denise  REFERRING PROVIDER: Dr. Lonni Vernetta  REFERRING DIAG: [phone] (ICD-10-CM) - Status post total right knee replacement   THERAPY DIAG:  Muscle weakness (generalized)  Limited joint range of motion (ROM)  Difficulty in walking, not elsewhere classified  Abnormality of gait and mobility  Acute pain of right knee  Rationale for Evaluation and Treatment: Rehabilitation  ONSET DATE: 09/19/2023  SUBJECTIVE:   SUBJECTIVE STATEMENT:  Patient reports he may have overdid it at home trying to improve his knee range of motion.    PERTINENT HISTORY:  Per ortho note from Dr. Vernetta: Mirian Georgena Dines, 58 y.o. male, has a history of pain and functional disability in the right knee due to trauma and has failed non-surgical conservative treatments for greater than 12 weeks to include NSAID's and/or analgesics, corticosteriod injections, use of assistive devices, and activity modification.  Onset of symptoms was abrupt, starting 1 years ago with gradually worsening course since that time. The patient noted prior  procedures on the knee to include  ORIF  on the right knee(s).  Per previous admission chart review from Camie Dine, PT, DPT: Relevant past medical history and comorbidities include s/p hardware removal and arthroscopy of R knee on 06/22/2023 (hardware originally for distal medial condyle fracture);  s/p L5-S1 ANTERIOR LUMBAR FUSION on 03/06/2023; Diverticulosis large intestine w/o perforation or abscess w/bleeding; Essential hypertension; Rectal bleeding; Hx of adenomatous colonic polyps; Personal hx of gastric bypass; Erectile dysfunction; Sleep disturbance; Lumbar spondylolysis; Microcytic anemia; Iron  deficiency anemia; Numbness and tingling; Imbalance; Difficulty sleeping; Disability of walking; Pain in joint involving ankle and foot; Degeneration of lumbar intervertebral disc; Erosive esophagitis; Mood disorder (HCC); Displaced supracondylar fracture of distal end of right femur without intracondylar extension (HCC); Anxiety; Vitamin D  deficiency; Metabolic bone disease; and Radiculopathy, lumbar region. Please see above for relevant surgery history; osteoporosis, bowel/urinary incontinence, stumbling and dropping things. Patient denies hx of cancer, stroke, seizures, lung problems, heart problems, diabetes, and unexplained weight loss.   PAIN:  Are you having pain? Yes 3-4/10 knee  PRECAUTIONS: Knee  RED FLAGS: Bowel or bladder incontinence: Yes: bowel     WEIGHT BEARING RESTRICTIONS: No  FALLS:  Has patient fallen in last 6 months? 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  NEXT MD VISIT: Next month- Dr. Vernetta  OBJECTIVE:  Note:  Objective measures were completed at Evaluation unless otherwise noted.  DIAGNOSTIC FINDINGS:  Narrative & Impression  CLINICAL DATA:  Status post right knee replacement.   EXAM: PORTABLE RIGHT KNEE - 1-2 VIEW   COMPARISON:  06/22/2023   FINDINGS: Right knee arthroplasty in expected alignment. No periprosthetic lucency or fracture. There has been patellar resurfacing. Recent postsurgical change includes air and edema in the soft tissues and joint space. Chronic ghost tracks in the distal femur. Anterior skin staples in place.   IMPRESSION: Right knee arthroplasty without immediate postoperative complication.     Electronically Signed   By: Andrea Gasman M.D.   On: 09/19/2023 16:43    PATIENT SURVEYS:  FOTO 39 with goal of 54  COGNITION: Overall cognitive status: Within functional limits for tasks assessed     SENSATION: Light touch: Impaired - reported dead zone lateral joint line  EDEMA:  Circumferential:  1)bottom of incision=38; 2)10 cm above distal incision-=48, 3) top of incision= 49    POSTURE: No Significant postural limitations  PALPATION: Global tenderness along right knee- joint line and posterior knee  LOWER EXTREMITY ROM:  Active ROM Right eval Left eval  Hip flexion    Hip extension    Hip abduction    Hip  adduction    Hip internal rotation    Hip external rotation    Knee flexion 90deg   Knee extension Lack 12 deg   Ankle dorsiflexion    Ankle plantarflexion    Ankle inversion    Ankle eversion     (Blank rows = not tested)    TODAY'S TREATMENT: DATE: 12/28/23   Nustep level 1 x 5 minutes   Standing quad/hip flex stretch (knee on stool) in // bars x 15 sec hold x 8 Standing forward lunges, R LE leading,  at stairs on the second step, 15 sec holds, x 8 Standing single leg calf raises 2 x 12 reps each LE Standing R TKE 2 x 10 with SLS on right and Min UE support on bars   Seated hamstring curl with matrix cable system-7.5 #  2 x 12 rep s  Supine SLS with hip abd/add- lifting LE up and over 8 bolster (vertically positioned)    Manual:   STM to Right distal quads/hamstring and application of biofreeze   PATIENT EDUCATION:  Education details: Purpose of PT, HEP, Pain management strategies including Ice, ROM, taking meds as prescribed. Pt educated throughout session about proper posture and technique with exercises. Improved exercise technique, movement at target joints, use of target muscles after min to mod verbal, visual, tactile cues.   Person educated: Patient Education method: Explanation, Demonstration, Tactile cues, Verbal cues, and Handouts Education comprehension: verbalized understanding, returned demonstration, verbal cues required, tactile cues required, and needs further education  HOME EXERCISE PROGRAM: Access Code: [email or web address] URL: [email or web address] Date: 10/25/2023 Prepared by: Reyes London  Exercises - Standing Hip Flexion March  - 1 x daily - 7 x weekly - 3 sets - 10 reps - Standing Hip Abduction with Counter Support  - 1 x daily - 7 x weekly - 3 sets - 10 reps - Standing Hip Extension with Counter Support  - 1 x daily - 7 x weekly - 3 sets - 10 reps - Standing Knee Flexion with Unilateral Counter Support  - 1 x daily - 3 sets - 10 reps - Mini Squat with Counter Support  - 1 x daily - 3 sets - 10 reps - Heel Toe Raises with Unilateral Counter Support  - 1 x daily - 1 x weekly - 3 sets - 10 reps - Toe Raises with Counter Support  - 1 x daily - 1 x weekly - 3 sets - 10 reps  Access Code: [facility] URL: [email or web address] Date: 10/11/2023 Prepared by: Reyes London  Exercises - Seated Hamstring Stretch  - 2 x daily - 3-5 sets - 30-45 hold - Seated Knee Flexion Stretch  - 2 x daily - 3-5 sets - 30-45 hold  ASSESSMENT:  CLINICAL IMPRESSION:   Patient presents with increased report of stiffness. He participated well during session- able to  follow all VC and perform well with ROM and LE strengthening. He reported feeling much better after session - stating I needed that. Patient will continue to benefit from skilled therapy to address remaining deficits in order to improve quality of life and return to PLOF.     OBJECTIVE IMPAIRMENTS: Abnormal gait, decreased activity tolerance, decreased balance, decreased endurance, decreased mobility, difficulty walking, decreased ROM, decreased strength, hypomobility, impaired flexibility, and pain.   ACTIVITY LIMITATIONS: carrying, lifting, bending, sitting, standing, squatting, sleeping, stairs, transfers, bed mobility, and toileting  PARTICIPATION LIMITATIONS: meal prep, cleaning, laundry, driving, shopping, community activity, occupation, and yard work  PERSONAL FACTORS: 1-2 comorbidities: chronic  pain, HTN  are also affecting patient's functional outcome.   REHAB POTENTIAL: Good  CLINICAL DECISION MAKING: Stable/uncomplicated  EVALUATION COMPLEXITY: Low   GOALS: Goals reviewed with patient? Yes  SHORT TERM GOALS: Target date: 11/08/2023 Pt will be independent with HEP in order to decrease ankle pain and increase strength in order to improve pain-free function at home and work.  Baseline: EVAL- No formal HEP in place; 12/10: independent  Goal status: ACHIEVED    LONG TERM GOALS: Target date: 12/06/2023  Pt will decrease worst pain as reported on NPRS by at least 3 points in order to demonstrate clinically significant reduction in ankle/foot pain.  Baseline: EVAL- R knee pain = 7-8/10; 12/05/23: 6-7/10 worst pain  12/07/23: 6-7 over past 7 days Goal status: ONGOING  2.  Pt will improve FOTO to target score of 54 to display perceived improvements in ability to complete ADL's.  Baseline: EVAL= 39;  12/05/23: 58 12/07/23: 49 Goal status: MET  3.  Patient will improve Right knee AROM extension to <5 deg from zero for improved ability to straighten his leg with all mobility.   Baseline: EVAL= 12 deg; 12/10: ~10 degrees but still quite edematous 12/07/23: 3 deg lacking Goal status: ONGOING  4.  Patient will improve Right knee AROM Flexion to > 120 deg for optimal right knee mobility with steps and sitting.  Baseline: EVAL= 90 deg; 12/05/23:  12/07/23: 109 deg Goal status: ONGOING  5.  Patient (> 63 years old) will complete five times sit to stand test in < 15 seconds indicating an increased LE strength and improved balance. Baseline: 11/14: 31.64 sec no UE support  12/07/23: 22.91 sec with no UE Goal status: ONGOING   6.  Patient will reduce timed up and go to <11 seconds to reduce fall risk and demonstrate improved transfer/gait ability. Baseline: 11/14 18.96 sec with SPC 12/07/23: 16.52 sec without AD; 12.76 sec with SPC Goal status: INITIAL  7.  Patient will increase 10 meter walk test to >1.63m/s as to improve gait speed for better community ambulation and to reduce fall risk. Baseline: 11:14 0.7m/s 12/07/23: 0.96 m/s Goal status: ONGOING    PLAN:  PT FREQUENCY: 1-2x/week  PT DURATION: 8 weeks  PLANNED INTERVENTIONS: [id number]- PT Re-evaluation, 97110-Therapeutic exercises, 97530- Therapeutic activity, 97112- Neuromuscular re-education, 97535- Self Care, [id number]- Manual therapy, [id number]- Gait training, [phone]- Orthotic Fit/training, [phone]- Electrical stimulation (manual), [phone]- Ultrasound, Patient/Family education, Balance training, Stair training, Taping, Dry Needling, Joint mobilization, Manual lymph drainage, Scar mobilization, Compression bandaging, Cryotherapy, and Moist heat  PLAN FOR NEXT SESSION:   Manual therapy for ROM, TE for strengthening and knee stability, and continue with dynamic balance activities.     Chyrl London, PT Physical Therapist - [facility]  12/28/23, 3:33 PM

## 2024-01-02 ENCOUNTER — Ambulatory Visit: Payer: Medicaid Other

## 2024-01-02 DIAGNOSIS — M2560 Stiffness of unspecified joint, not elsewhere classified: Secondary | ICD-10-CM

## 2024-01-02 DIAGNOSIS — M25561 Pain in right knee: Secondary | ICD-10-CM

## 2024-01-02 DIAGNOSIS — R262 Difficulty in walking, not elsewhere classified: Secondary | ICD-10-CM

## 2024-01-02 DIAGNOSIS — M6281 Muscle weakness (generalized): Secondary | ICD-10-CM | POA: Diagnosis not present

## 2024-01-02 DIAGNOSIS — R269 Unspecified abnormalities of gait and mobility: Secondary | ICD-10-CM

## 2024-01-02 DIAGNOSIS — Z7689 Persons encountering health services in other specified circumstances: Secondary | ICD-10-CM | POA: Diagnosis not present

## 2024-01-02 DIAGNOSIS — M25661 Stiffness of right knee, not elsewhere classified: Secondary | ICD-10-CM

## 2024-01-02 NOTE — Therapy (Signed)
 OUTPATIENT PHYSICAL THERAPY TREATMENT   Patient Name: Harold Smith MRN: [id number] DOB:[birth year], 58 y.o., male Today's Date: 01/02/2024  END OF SESSION:  PT End of Session - 01/02/24 0852     Visit Number 17    Number of Visits 26    Date for PT Re-Evaluation 01/31/24    Authorization Type [employer] 2024    Authorization - Number of Visits 30    Progress Note Due on Visit 20    PT Start Time 0847    PT Stop Time 0927    PT Time Calculation (min) 40 min    Equipment Utilized During Treatment Gait belt    Activity Tolerance Patient tolerated treatment well;No increased pain    Behavior During Therapy WFL for tasks assessed/performed               Past Medical History:  Diagnosis Date   Allergic rhinitis due to pollen    Anemia    Anxiety    Chronic pain    Colon polyps    Depression    Diverticulitis large intestine    Erosive esophagitis    GERD (gastroesophageal reflux disease)    HTN (hypertension)    Irritable bowel syndrome    Metabolic bone disease 09/01/2022   Obesity    Osteoarthritis, knee    Sleep apnea    does not use cpap, pt states he no longer needed CPAP due to weight loss   Sleep disturbance    Vitamin D  deficiency 09/01/2022   Past Surgical History:  Procedure Laterality Date   ABDOMINAL EXPOSURE N/A 03/06/2023   Procedure: ABDOMINAL EXPOSURE;  Surgeon: Gretta Lonni PARAS, MD;  Location: [facility] OR;  Service: Vascular;  Laterality: N/A;   ACHILLES TENDON REPAIR Right 12/26/2006   ANTERIOR LUMBAR FUSION N/A 03/06/2023   Procedure: L5-S1 ANTERIOR LUMBAR FUSION 1 LEVEL;  Surgeon: Georgina Ozell LABOR, MD;  Location: [facility] OR;  Service: Orthopedics;  Laterality: N/A;   BIOPSY  08/29/2023   Procedure: BIOPSY;  Surgeon: Unk Corinn Skiff, MD;  Location: [facility] ENDOSCOPY;  Service: Gastroenterology;;   CATARACT EXTRACTION W/PHACO Left 07/20/2021   Procedure: CATARACT EXTRACTION PHACO AND INTRAOCULAR LENS PLACEMENT (IOC) LEFT 2.11  00:24.0;  Surgeon: Jaye Fallow, MD;  Location: [facility] SUR[facility]ERY [facility]TR;  Service: Ophthalmology;  Laterality: Left;  sleep apnea   CATARACT EXTRACTION W/PHACO Right 08/03/2021   Procedure: CATARACT EXTRACTION PHACO AND INTRAOCULAR LENS PLACEMENT (IOC) RIGHT;  Surgeon: Jaye Fallow, MD;  Location: [facility] SURGERY [facility]TR;  Service: Ophthalmology;  Laterality: Right;  3.19 0:33.0   CHOLECYSTECTOMY     COLON RESECTION Left 12/26/2004   due to diverticular disease at [facility]   COLONOSCOPY  2013?   COLONOSCOPY N/A 11/30/2022   Procedure: COLONOSCOPY;  Surgeon: Unk Corinn Skiff, MD;  Location: [facility] ENDOSCOPY;  Service: Gastroenterology;  Laterality: N/A;   COLONOSCOPY WITH PROPOFOL  N/A 05/22/2018   Procedure: COLONOSCOPY WITH PROPOFOL ;  Surgeon: Unk Corinn Skiff, MD;  Location: [facility] ENDOSCOPY;  Service: Gastroenterology;  Laterality: N/A;   COLONOSCOPY WITH PROPOFOL  N/A 10/22/2020   Procedure: COLONOSCOPY WITH PROPOFOL ;  Surgeon: Unk Corinn Skiff, MD;  Location: [facility] SURGERY CNTR;  Service: Endoscopy;  Laterality: N/A;  priority 4   COLONOSCOPY WITH PROPOFOL  N/A 11/10/2021   Procedure: COLONOSCOPY WITH PROPOFOL ;  Surgeon: Unk Corinn Skiff, MD;  Location: [facility] ENDOSCOPY;  Service: Gastroenterology;  Laterality: N/A;   COLONOSCOPY WITH PROPOFOL  N/A 06/16/2022   Procedure: COLONOSCOPY WITH PROPOFOL ;  Surgeon: Unk Corinn Skiff, MD;  Location: [facility]ERY [facility];  Service: Endoscopy;  Laterality: N/A;   COLONOSCOPY WITH PROPOFOL  N/A 11/29/2022   Procedure: COLONOSCOPY WITH PROPOFOL ;  Surgeon: Unk Corinn Skiff, MD;  Location: [facility] ENDOSCOPY;  Service: Gastroenterology;  Laterality: N/A;   DRUG INDUCED ENDOSCOPY N/A 06/10/2019   Procedure: DRUG INDUCED SLEEP ENDOSCOPY;  Surgeon: Mable Lenis, MD;  Location: [facility];  Service: ENT;  Laterality: N/A;   ESOPHAGOGASTRODUODENOSCOPY (EGD) WITH PROPOFOL  N/A 06/16/2022   Procedure: ESOPHAGOGASTRODUODENOSCOPY (EGD) WITH  PROPOFOL ;  Surgeon: Unk Corinn Skiff, MD;  Location: [facility] SURGERY [facility]TR;  Service: Endoscopy;  Laterality: N/A;   ESOPHAGOGASTRODUODENOSCOPY (EGD) WITH PROPOFOL  N/A 05/24/2023   Procedure: ESOPHAGOGASTRODUODENOSCOPY (EGD) WITH PROPOFOL ;  Surgeon: Unk Corinn Skiff, MD;  Location: [facility] ENDOSCOPY;  Service: Gastroenterology;  Laterality: N/A;   ESOPHAGOGASTRODUODENOSCOPY (EGD) WITH PROPOFOL  N/A 08/29/2023   Procedure: ESOPHAGOGASTRODUODENOSCOPY (EGD) WITH PROPOFOL ;  Surgeon: Unk Corinn Skiff, MD;  Location: [facility] ENDOSCOPY;  Service: Gastroenterology;  Laterality: N/A;   GASTRIC BYPASS N/A 12/27/2007   HARDWARE REMOVAL Right 06/22/2023   Procedure: HARDWARE REMOVAL;  Surgeon: Celena Sharper, MD;  Location: [facility] OR;  Service: Orthopedics;  Laterality: Right;   HIATAL HERNIA REPAIR  09/25/2014   at [facility]   KNEE ARTHROSCOPY Right 06/22/2023   Procedure: ARTHROSCOPY KNEE;  Surgeon: Celena Sharper, MD;  Location: [facility] OR;  Service: Orthopedics;  Laterality: Right;   ORIF FEMUR FRACTURE Right 08/30/2022   Procedure: OPEN REDUCTION INTERNAL FIXATION (ORIF) DISTAL FEMUR FRACTURE;  Surgeon: Celena Sharper, MD;  Location: [facility] OR;  Service: Orthopedics;  Laterality: Right;   POLYPECTOMY  10/22/2020   Procedure: POLYPECTOMY;  Surgeon: Unk Corinn Skiff, MD;  Location: [facility] SURGERY CNTR;  Service: Endoscopy;;   POLYPECTOMY  06/16/2022   Procedure: POLYPECTOMY;  Surgeon: Unk Corinn Skiff, MD;  Location: [facility] SURGERY [facility];  Service: Endoscopy;;   ROTATOR CUFF REPAIR Right    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   TOTAL KNEE ARTHROPLASTY Right 09/19/2023   Procedure: RIGHT TOTAL KNEE ARTHROPLASTY;  Surgeon: Vernetta Lonni GRADE, MD;  Location: [facility] OR;  Service: Orthopedics;  Laterality: Right;   Patient Active Problem List   Diagnosis Date Noted   Secondary hyperparathyroidism, non-renal (HCC) 10/25/2023   Age-related osteoporosis without current pathological fracture 10/23/2023    S/P total knee arthroplasty, right 09/19/2023   Status post total right knee replacement 09/19/2023   Chronic pain syndrome 07/12/2023   Anxiety 09/01/2022   Vitamin D  deficiency 09/01/2022   Metabolic bone disease  09/01/2022   Displaced supracondylar fracture of distal end of right femur without intracondylar extension (HCC) 08/29/2022   Mood disorder (HCC) 07/08/2022   Disability of walking 06/01/2022   Degeneration of lumbar intervertebral disc 06/01/2022   Iron  deficiency anemia 05/10/2022   Lumbar spondylolysis 02/25/2022   Sleep disturbance 07/02/2021   Preventative health care 06/23/2018   Personal hx of gastric bypass 06/23/2018   Erectile dysfunction 06/23/2018   Hx of adenomatous colonic polyps    Essential hypertension 10/17/2013   Diverticulosis large intestine w/o perforation or abscess w/bleeding    Colon polyps     PCP: Dr. Charlie Denise  REFERRING PROVIDER: Dr. Lonni Vernetta  REFERRING DIAG: [phone] (ICD-10-CM) - Status post total right knee replacement   THERAPY DIAG:  Muscle weakness (generalized)  Limited joint range of motion (ROM)  Difficulty in walking, not elsewhere classified  Abnormality of gait and mobility  Acute pain of right knee  Stiffness of right knee, not elsewhere classified  Rationale for Evaluation and Treatment: Rehabilitation  ONSET DATE:  09/19/2023  SUBJECTIVE:   SUBJECTIVE STATEMENT:   Patient reports he needed to change his time due to another appt later today. States feeling stiff- knee pain around 6/10 pain today   PERTINENT HISTORY:  Per ortho note from Dr. Vernetta: Mirian Georgena Dines, 58 y.o. male, has a history of pain and functional disability in the right knee due to trauma and has failed non-surgical conservative treatments for greater than 12 weeks to include NSAID's and/or analgesics, corticosteriod injections, use of assistive devices, and activity modification.  Onset of symptoms was abrupt, starting 1  years ago with gradually worsening course since that time. The patient noted prior procedures on the knee to include  ORIF  on the right knee(s).  Per previous admission chart review from Camie Dine, PT, DPT: Relevant past medical history and comorbidities include s/p hardware removal and arthroscopy of R knee on 06/22/2023 (hardware originally for distal medial condyle fracture);  s/p L5-S1 ANTERIOR LUMBAR FUSION on 03/06/2023; Diverticulosis large intestine w/o perforation or abscess w/bleeding; Essential hypertension; Rectal bleeding; Hx of adenomatous colonic polyps; Personal hx of gastric bypass; Erectile dysfunction; Sleep disturbance; Lumbar spondylolysis; Microcytic anemia; Iron  deficiency anemia; Numbness and tingling; Imbalance; Difficulty sleeping; Disability of walking; Pain in joint involving ankle and foot; Degeneration of lumbar intervertebral disc; Erosive esophagitis; Mood disorder (HCC); Displaced supracondylar fracture of distal end of right femur without intracondylar extension (HCC); Anxiety; Vitamin D  deficiency; Metabolic bone disease; and Radiculopathy, lumbar region. Please see above for relevant surgery history; osteoporosis, bowel/urinary incontinence, stumbling and dropping things. Patient denies hx of cancer, stroke, seizures, lung problems, heart problems, diabetes, and unexplained weight loss.   PAIN:  Are you having pain? Yes 3-4/10 knee  PRECAUTIONS: Knee  RED FLAGS: Bowel or bladder incontinence: Yes: bowel     WEIGHT BEARING RESTRICTIONS: No  FALLS:  Has patient fallen in last 6 months? 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  NEXT MD VISIT: Next month- Dr. Vernetta  OBJECTIVE:  Note: Objective measures were completed at Evaluation unless otherwise noted.  DIAGNOSTIC FINDINGS:  Narrative & Impression  CLINICAL DATA:  Status post right knee replacement.   EXAM: PORTABLE RIGHT KNEE - 1-2 VIEW   COMPARISON:  06/22/2023   FINDINGS: Right knee arthroplasty in expected alignment. No periprosthetic lucency or fracture. There has been patellar resurfacing. Recent postsurgical change includes air and edema in the soft tissues and joint space. Chronic ghost tracks in the distal femur. Anterior skin staples in place.   IMPRESSION: Right knee arthroplasty without immediate postoperative complication.     Electronically Signed   By: Andrea Gasman M.D.   On: 09/19/2023 16:43    PATIENT SURVEYS:  FOTO 39 with goal of 54  COGNITION: Overall cognitive status: Within functional limits for tasks assessed     SENSATION: Light touch: Impaired - reported dead zone lateral joint line  EDEMA:  Circumferential:  1)bottom of incision=38; 2)10 cm above distal incision-=48, 3) top of incision= 49    POSTURE: No Significant postural limitations  PALPATION: Global tenderness along right knee- joint line and posterior knee  LOWER EXTREMITY ROM:  Active ROM Right eval Left eval  Hip flexion  Hip extension    Hip abduction    Hip adduction    Hip internal rotation    Hip external rotation    Knee flexion 90deg   Knee extension Lack 12 deg   Ankle dorsiflexion    Ankle plantarflexion    Ankle inversion    Ankle eversion     (Blank rows = not tested)    TODAY'S TREATMENT: DATE: 01/02/24   Progressive Octane- L1-L4  x 5 minutes   Standing knee flex stretch (foot on 12 in step in // bars) - hold 10 sec x 15 reps. (Knee ROM with stretch measured at 118 deg)   Standing Knee ext stretch (hamstring) R foot on 12 in box in // bars- hold 15 sec x 4   Standing quad/hip flex  stretch (knee on stool) in // bars x 15 sec hold x 4  Standing hip mobility- circles R LE - 12 reps CW, 12 reps CCW  Standing hip abd using matrix cable at 7.5# 2 sets x 12 reps  Sit to stand with left LE on airex pad x 12 reps   Single leg standing in // bars and reach forward to pick up cone off stool then return to erect SLS and then place in back - alt sides x 12  Side step with squat along side of // bars with light UE touch - down and back x 2 then added GTB around distal quads for resistance- 3 more trips down and back.        PATIENT EDUCATION:  Education details: Purpose of PT, HEP, Pain management strategies including Ice, ROM, taking meds as prescribed. Pt educated throughout session about proper posture and technique with exercises. Improved exercise technique, movement at target joints, use of target muscles after min to mod verbal, visual, tactile cues.   Person educated: Patient Education method: Explanation, Demonstration, Tactile cues, Verbal cues, and Handouts Education comprehension: verbalized understanding, returned demonstration, verbal cues required, tactile cues required, and needs further education  HOME EXERCISE PROGRAM: Access Code: [email or web address] URL: [email or web address] Date: 10/25/2023 Prepared by: Reyes London  Exercises - Standing Hip Flexion March  - 1 x daily - 7 x weekly - 3 sets - 10 reps - Standing Hip Abduction with Counter Support  - 1 x daily - 7 x weekly - 3 sets - 10 reps - Standing Hip Extension with Counter Support  - 1 x daily - 7 x weekly - 3 sets - 10 reps - Standing Knee Flexion with Unilateral Counter Support  - 1 x daily - 3 sets - 10 reps - Mini Squat with Counter Support  - 1 x daily - 3 sets - 10 reps - Heel Toe Raises with Unilateral Counter Support  - 1 x daily - 1 x weekly - 3 sets - 10 reps - Toe Raises with Counter Support  - 1 x daily - 1 x weekly - 3 sets - 10 reps  Access Code: [facility] URL:  [email or web address] Date: 10/11/2023 Prepared by: Reyes London  Exercises - Seated Hamstring Stretch  - 2 x daily - 3-5 sets - 30-45 hold - Seated Knee Flexion Stretch  - 2 x daily - 3-5 sets - 30-45 hold  ASSESSMENT:  CLINICAL IMPRESSION:  Patient presents with good motivation and able to complete all activities without report of increased pain. He presents today with improving ROM and able to add some more functional strength and dynamic stability exercises. He was able to single leg stand on right  LE comparable to his left LE just subjectively reported not feeling as stable. He is progressing well adding resistance as appropriate. Patient will continue to benefit from skilled therapy to address remaining deficits in order to improve quality of life and return to PLOF.     OBJECTIVE IMPAIRMENTS: Abnormal gait, decreased activity tolerance, decreased balance, decreased endurance, decreased mobility, difficulty walking, decreased ROM, decreased strength, hypomobility, impaired flexibility, and pain.   ACTIVITY LIMITATIONS: carrying, lifting, bending, sitting, standing, squatting, sleeping, stairs, transfers, bed mobility, and toileting  PARTICIPATION LIMITATIONS: meal prep, cleaning, laundry, driving, shopping, community activity, occupation, and yard work  PERSONAL FACTORS: 1-2 comorbidities: chronic pain, HTN  are also affecting patient's functional outcome.   REHAB POTENTIAL: Good  CLINICAL DECISION MAKING: Stable/uncomplicated  EVALUATION COMPLEXITY: Low   GOALS: Goals reviewed with patient? Yes  SHORT TERM GOALS: Target date: 11/08/2023 Pt will be independent with HEP in order to decrease ankle pain and increase strength in order to improve pain-free function at home and work.  Baseline: EVAL- No formal HEP in place; 12/10: independent  Goal status: ACHIEVED    LONG TERM GOALS: Target date: 12/06/2023  Pt will decrease worst pain as reported on NPRS  by at least 3 points in order to demonstrate clinically significant reduction in ankle/foot pain.  Baseline: EVAL- R knee pain = 7-8/10; 12/05/23: 6-7/10 worst pain  12/07/23: 6-7 over past 7 days Goal status: ONGOING  2.  Pt will improve FOTO to target score of 54 to display perceived improvements in ability to complete ADL's.  Baseline: EVAL= 39;  12/05/23: 58 12/07/23: 49 Goal status: MET  3.  Patient will improve Right knee AROM extension to <5 deg from zero for improved ability to straighten his leg with all mobility.  Baseline: EVAL= 12 deg; 12/10: ~10 degrees but still quite edematous 12/07/23: 3 deg lacking Goal status: ONGOING  4.  Patient will improve Right knee AROM Flexion to > 120 deg for optimal right knee mobility with steps and sitting.  Baseline: EVAL= 90 deg; 12/05/23:  12/07/23: 109 deg Goal status: ONGOING  5.  Patient (> 58 years old) will complete five times sit to stand test in < 15 seconds indicating an increased LE strength and improved balance. Baseline: 11/14: 31.64 sec no UE support  12/07/23: 22.91 sec with no UE Goal status: ONGOING   6.  Patient will reduce timed up and go to <11 seconds to reduce fall risk and demonstrate improved transfer/gait ability. Baseline: 11/14 18.96 sec with SPC 12/07/23: 16.52 sec without AD; 12.76 sec with SPC Goal status: INITIAL  7.  Patient will increase 10 meter walk test to >1.36m/s as to improve gait speed for better community ambulation and to reduce fall risk. Baseline: 11:14 0.41m/s 12/07/23: 0.96 m/s Goal status: ONGOING    PLAN:  PT FREQUENCY: 1-2x/week  PT DURATION: 8 weeks  PLANNED INTERVENTIONS: 97164- PT Re-evaluation, 97110-Therapeutic exercises, 97530- Therapeutic activity, 97112- Neuromuscular re-education, 97535- Self Care, [id number]- Manual therapy, [id number]- Gait training, [phone]- Orthotic Fit/training, [phone]- Electrical stimulation (manual), [phone]- Ultrasound, Patient/Family education, Balance  training, Stair training, Taping, Dry Needling, Joint mobilization, Manual lymph drainage, Scar mobilization, Compression bandaging, Cryotherapy, and Moist heat  PLAN FOR NEXT SESSION:   Manual therapy for ROM, TE for strengthening and knee stability, and continue with dynamic balance activities.     Chyrl London, PT Physical Therapist - [facility]  01/02/24, 9:47 AM

## 2024-01-03 NOTE — Therapy (Signed)
 OUTPATIENT PHYSICAL THERAPY TREATMENT   Patient Name: Barnes Florek MRN: [id number] DOB:January 24, 1966, 58 y.o., male Today's Date: 01/05/2024  END OF SESSION:  PT End of Session - 01/04/24 0851     Visit Number 18    Number of Visits 26    Date for PT Re-Evaluation 01/31/24    Authorization Type [employer] 2024    Authorization - Number of Visits 30    Progress Note Due on Visit 20    PT Start Time 0845    PT Stop Time 0928    PT Time Calculation (min) 43 min    Equipment Utilized During Treatment Gait belt    Activity Tolerance Patient tolerated treatment well;No increased pain    Behavior During Therapy WFL for tasks assessed/performed                Past Medical History:  Diagnosis Date   Allergic rhinitis due to pollen    Anemia    Anxiety    Chronic pain    Colon polyps    Depression    Diverticulitis large intestine    Erosive esophagitis    GERD (gastroesophageal reflux disease)    HTN (hypertension)    Irritable bowel syndrome    Metabolic bone disease 09/01/2022   Obesity    Osteoarthritis, knee    Sleep apnea    does not use cpap, pt states he no longer needed CPAP due to weight loss   Sleep disturbance    Vitamin D  deficiency 09/01/2022   Past Surgical History:  Procedure Laterality Date   ABDOMINAL EXPOSURE N/A 03/06/2023   Procedure: ABDOMINAL EXPOSURE;  Surgeon: Gretta Lonni PARAS, MD;  Location: [facility] OR;  Service: Vascular;  Laterality: N/A;   ACHILLES TENDON REPAIR Right 12/26/2006   ANTERIOR LUMBAR FUSION N/A 03/06/2023   Procedure: L5-S1 ANTERIOR LUMBAR FUSION 1 LEVEL;  Surgeon: Georgina Ozell LABOR, MD;  Location: [facility] OR;  Service: Orthopedics;  Laterality: N/A;   BIOPSY  08/29/2023   Procedure: BIOPSY;  Surgeon: Unk Corinn Skiff, MD;  Location: [facility] ENDOSCOPY;  Service: Gastroenterology;;   CATARACT EXTRACTION W/PHACO Left 07/20/2021   Procedure: CATARACT EXTRACTION PHACO AND INTRAOCULAR LENS PLACEMENT (IOC) LEFT 2.11  00:24.0;  Surgeon: Jaye Fallow, MD;  Location: [facility] SURGERY CNTR;  Service: Ophthalmology;  Laterality: Left;  sleep apnea   CATARACT EXTRACTION W/PHACO Right 08/03/2021   Procedure: CATARACT EXTRACTION PHACO AND INTRAOCULAR LENS PLACEMENT (IOC) RIGHT;  Surgeon: Jaye Fallow, MD;  Location: [facility] SURGERY CNTR;  Service: Ophthalmology;  Laterality: Right;  3.19 0:33.0   CHOLECYSTECTOMY     COLON RESECTION Left 12/26/2004   due to diverticular disease at [facility]   COLONOSCOPY  2013?   COLONOSCOPY N/A 11/30/2022   Procedure: COLONOSCOPY;  Surgeon: Unk Corinn Skiff, MD;  Location: [facility] ENDOSCOPY;  Service: Gastroenterology;  Laterality: N/A;   COLONOSCOPY WITH PROPOFOL  N/A 05/22/2018   Procedure: COLONOSCOPY WITH PROPOFOL ;  Surgeon: Unk Corinn Skiff, MD;  Location: [facility] ENDOSCOPY;  Service: Gastroenterology;  Laterality: N/A;   COLONOSCOPY WITH PROPOFOL  N/A 10/22/2020   Procedure: COLONOSCOPY WITH PROPOFOL ;  Surgeon: Unk Corinn Skiff, MD;  Location: [facility] SURGERY CNTR;  Service: Endoscopy;  Laterality: N/A;  priority 4   COLONOSCOPY WITH PROPOFOL  N/A 11/10/2021   Procedure: COLONOSCOPY WITH PROPOFOL ;  Surgeon: Unk Corinn Skiff, MD;  Location: [facility] ENDOSCOPY;  Service: Gastroenterology;  Laterality: N/A;   COLONOSCOPY WITH PROPOFOL  N/A 06/16/2022   Procedure: COLONOSCOPY WITH PROPOFOL ;  Surgeon: Unk Corinn Skiff, MD;  Location: [facility] SURGERY  CNTR;  Service: Endoscopy;  Laterality: N/A;   COLONOSCOPY WITH PROPOFOL  N/A 11/29/2022   Procedure: COLONOSCOPY WITH PROPOFOL ;  Surgeon: Unk Corinn Skiff, MD;  Location: [facility] ENDOSCOPY;  Service: Gastroenterology;  Laterality: N/A;   DRUG INDUCED ENDOSCOPY N/A 06/10/2019   Procedure: DRUG INDUCED SLEEP ENDOSCOPY;  Surgeon: Mable Lenis, MD;  Location: [facility];  Service: ENT;  Laterality: N/A;   ESOPHAGOGASTRODUODENOSCOPY (EGD) WITH PROPOFOL  N/A 06/16/2022   Procedure: ESOPHAGOGASTRODUODENOSCOPY (EGD) WITH  PROPOFOL ;  Surgeon: Unk Corinn Skiff, MD;  Location: [facility] SURGERY CNTR;  Service: Endoscopy;  Laterality: N/A;   ESOPHAGOGASTRODUODENOSCOPY (EGD) WITH PROPOFOL  N/A 05/24/2023   Procedure: ESOPHAGOGASTRODUODENOSCOPY (EGD) WITH PROPOFOL ;  Surgeon: Unk Corinn Skiff, MD;  Location: [facility] ENDOSCOPY;  Service: Gastroenterology;  Laterality: N/A;   ESOPHAGOGASTRODUODENOSCOPY (EGD) WITH PROPOFOL  N/A 08/29/2023   Procedure: ESOPHAGOGASTRODUODENOSCOPY (EGD) WITH PROPOFOL ;  Surgeon: Unk Corinn Skiff, MD;  Location: [facility] ENDOSCOPY;  Service: Gastroenterology;  Laterality: N/A;   GASTRIC BYPASS N/A 12/27/2007   HARDWARE REMOVAL Right 06/22/2023   Procedure: HARDWARE REMOVAL;  Surgeon: Celena Sharper, MD;  Location: [facility] OR;  Service: Orthopedics;  Laterality: Right;   HIATAL HERNIA REPAIR  09/25/2014   at [facility]   KNEE ARTHROSCOPY Right 06/22/2023   Procedure: ARTHROSCOPY KNEE;  Surgeon: Celena Sharper, MD;  Location: [facility] OR;  Service: Orthopedics;  Laterality: Right;   ORIF FEMUR FRACTURE Right 08/30/2022   Procedure: OPEN REDUCTION INTERNAL FIXATION (ORIF) DISTAL FEMUR FRACTURE;  Surgeon: Celena Sharper, MD;  Location: [facility] OR;  Service: Orthopedics;  Laterality: Right;   POLYPECTOMY  10/22/2020   Procedure: POLYPECTOMY;  Surgeon: Unk Corinn Skiff, MD;  Location: [facility] SURGERY CNTR;  Service: Endoscopy;;   POLYPECTOMY  06/16/2022   Procedure: POLYPECTOMY;  Surgeon: Unk Corinn Skiff, MD;  Location: [facility] SURGERY [facility]TR;  Service: Endoscopy;;   ROTATOR CUFF REPAIR Right    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   TOTAL KNEE ARTHROPLASTY Right 09/19/2023   Procedure: RIGHT TOTAL KNEE ARTHROPLASTY;  Surgeon: Vernetta Lonni GRADE, MD;  Location: [facility] OR;  Service: Orthopedics;  Laterality: Right;   Patient Active Problem List   Diagnosis Date Noted   Secondary hyperparathyroidism, non-renal (HCC) 10/25/2023   Age-related osteoporosis without current pathological fracture 10/23/2023    S/P total knee arthroplasty, right 09/19/2023   Status post total right knee replacement 09/19/2023   Chronic pain syndrome 07/12/2023   Anxiety 09/01/2022   Vitamin D  deficiency 09/01/2022   Metabolic bone disease  09/01/2022   Displaced supracondylar fracture of distal end of right femur without intracondylar extension (HCC) 08/29/2022   Mood disorder (HCC) 07/08/2022   Disability of walking 06/01/2022   Degeneration of lumbar intervertebral disc 06/01/2022   Iron  deficiency anemia 05/10/2022   Lumbar spondylolysis 02/25/2022   Sleep disturbance 07/02/2021   Preventative health care 06/23/2018   Personal hx of gastric bypass 06/23/2018   Erectile dysfunction 06/23/2018   Hx of adenomatous colonic polyps    Essential hypertension 10/17/2013   Diverticulosis large intestine w/o perforation or abscess w/bleeding    Colon polyps     PCP: Dr. Charlie Denise  REFERRING PROVIDER: Dr. Lonni Vernetta  REFERRING DIAG: [phone] (ICD-10-CM) - Status post total right knee replacement   THERAPY DIAG:  Muscle weakness (generalized)  Limited joint range of motion (ROM)  Difficulty in walking, not elsewhere classified  Abnormality of gait and mobility  Acute pain of right knee  Stiffness of right knee, not elsewhere classified  Rationale for Evaluation and Treatment: Rehabilitation  ONSET DATE: 09/19/2023  SUBJECTIVE:   SUBJECTIVE STATEMENT:  Patient reports not feeling well today. Thinks he has another hiatal hernia.    PERTINENT HISTORY:  Per ortho note from Dr. Vernetta: Mirian Georgena Dines, 58 y.o. male, has a history of pain and functional disability in the right knee due to trauma and has failed non-surgical conservative treatments for greater than 12 weeks to include NSAID's and/or analgesics, corticosteriod injections, use of assistive devices, and activity modification.  Onset of symptoms was abrupt, starting 1 years ago with gradually worsening course since that  time. The patient noted prior procedures on the knee to include  ORIF  on the right knee(s).  Per previous admission chart review from Camie Dine, PT, DPT: Relevant past medical history and comorbidities include s/p hardware removal and arthroscopy of R knee on 06/22/2023 (hardware originally for distal medial condyle fracture);  s/p L5-S1 ANTERIOR LUMBAR FUSION on 03/06/2023; Diverticulosis large intestine w/o perforation or abscess w/bleeding; Essential hypertension; Rectal bleeding; Hx of adenomatous colonic polyps; Personal hx of gastric bypass; Erectile dysfunction; Sleep disturbance; Lumbar spondylolysis; Microcytic anemia; Iron  deficiency anemia; Numbness and tingling; Imbalance; Difficulty sleeping; Disability of walking; Pain in joint involving ankle and foot; Degeneration of lumbar intervertebral disc; Erosive esophagitis; Mood disorder (HCC); Displaced supracondylar fracture of distal end of right femur without intracondylar extension (HCC); Anxiety; Vitamin D  deficiency; Metabolic bone disease; and Radiculopathy, lumbar region. Please see above for relevant surgery history; osteoporosis, bowel/urinary incontinence, stumbling and dropping things. Patient denies hx of cancer, stroke, seizures, lung problems, heart problems, diabetes, and unexplained weight loss.   PAIN:  Are you having pain? Yes 3-4/10 knee  PRECAUTIONS: Knee  RED FLAGS: Bowel or bladder incontinence: Yes: bowel     WEIGHT BEARING RESTRICTIONS: No  FALLS:  Has patient fallen in last 6 months? 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  NEXT MD VISIT: Next month- Dr.  Vernetta  OBJECTIVE:  Note: Objective measures were completed at Evaluation unless otherwise noted.  DIAGNOSTIC FINDINGS:  Narrative & Impression  CLINICAL DATA:  Status post right knee replacement.   EXAM: PORTABLE RIGHT KNEE - 1-2 VIEW   COMPARISON:  06/22/2023   FINDINGS: Right knee arthroplasty in expected alignment. No periprosthetic lucency or fracture. There has been patellar resurfacing. Recent postsurgical change includes air and edema in the soft tissues and joint space. Chronic ghost tracks in the distal femur. Anterior skin staples in place.   IMPRESSION: Right knee arthroplasty without immediate postoperative complication.     Electronically Signed   By: Andrea Gasman M.D.   On: 09/19/2023 16:43    PATIENT SURVEYS:  FOTO 39 with goal of 54  COGNITION: Overall cognitive status: Within functional limits for tasks assessed     SENSATION: Light touch: Impaired - reported dead zone lateral joint line  EDEMA:  Circumferential:  1)bottom of incision=38; 2)10 cm above distal incision-=48, 3) top of incision= 49    POSTURE: No Significant postural limitations  PALPATION: Global tenderness along right knee- joint line and posterior knee  LOWER EXTREMITY ROM:  Active ROM Right eval Left eval  Hip flexion    Hip extension    Hip  abduction    Hip adduction    Hip internal rotation    Hip external rotation    Knee flexion 90deg   Knee extension Lack 12 deg   Ankle dorsiflexion    Ankle plantarflexion    Ankle inversion    Ankle eversion     (Blank rows = not tested)    TODAY'S TREATMENT: DATE: 01/05/24   Progressive Octane- L1-L4  x 5 minutes - Able to progressing add more resistance without any pain- continuously monitoring for pain and exertion level  Standing knee flex stretch (foot on 12 in step in // bars) - hold 10 sec x 15 reps.   Standing Knee ext stretch (hamstring) R foot on 12 in box in // bars- hold 15 sec x 4  Step ups  fwd- x 15 reps (0 weight to avoid any increase strain to abdominal region)   Eccentric step down- 15 reps (focusing on slow controlled motion with RLE)   *patient received phone call from spinal center during visit  and going to have his back sx on 01/23/2024.  NMR:   Single leg standing in // bars and perform ball toss 2 sets x 15 on RLE  Tandem walking in // bars- fwd/retro - down and back x 10 in // bars.  Lateral side step up/over orange hurdle x 25 reps without UE Support.         PATIENT EDUCATION:  Education details: Purpose of PT, HEP, Pain management strategies including Ice, ROM, taking meds as prescribed. Pt educated throughout session about proper posture and technique with exercises. Improved exercise technique, movement at target joints, use of target muscles after min to mod verbal, visual, tactile cues.   Person educated: Patient Education method: Explanation, Demonstration, Tactile cues, Verbal cues, and Handouts Education comprehension: verbalized understanding, returned demonstration, verbal cues required, tactile cues required, and needs further education  HOME EXERCISE PROGRAM: Access Code: [email or web address] URL: [email or web address] Date: 10/25/2023 Prepared by: Reyes London  Exercises - Standing Hip Flexion March  - 1 x daily - 7 x weekly - 3 sets - 10 reps - Standing Hip Abduction with Counter Support  - 1 x daily - 7 x weekly - 3 sets - 10 reps - Standing Hip Extension with Counter Support  - 1 x daily - 7 x weekly - 3 sets - 10 reps - Standing Knee Flexion with Unilateral Counter Support  - 1 x daily - 3 sets - 10 reps - Mini Squat with Counter Support  - 1 x daily - 3 sets - 10 reps - Heel Toe Raises with Unilateral Counter Support  - 1 x daily - 1 x weekly - 3 sets - 10 reps - Toe Raises with Counter Support  - 1 x daily - 1 x weekly - 3 sets - 10 reps  Access Code: [facility] URL: [email or web address] Date:  10/11/2023 Prepared by: Reyes London  Exercises - Seated Hamstring Stretch  - 2 x daily - 3-5 sets - 30-45 hold - Seated Knee Flexion Stretch  - 2 x daily - 3-5 sets - 30-45 hold  ASSESSMENT:  CLINICAL IMPRESSION:  Treatment limited/modified due to patient new complaint of some abdominal pain with possible hernia. Advised him to call his PCP to further evaluate. He did respond well to all stretching and strengthening/balance activities without any report of increased pain in knee or abdomen today. He is progressing with balance- able to improve his tandem and SLS with practice today.  Patient will continue to  benefit from skilled therapy to address remaining deficits in order to improve quality of life and return to PLOF.     OBJECTIVE IMPAIRMENTS: Abnormal gait, decreased activity tolerance, decreased balance, decreased endurance, decreased mobility, difficulty walking, decreased ROM, decreased strength, hypomobility, impaired flexibility, and pain.   ACTIVITY LIMITATIONS: carrying, lifting, bending, sitting, standing, squatting, sleeping, stairs, transfers, bed mobility, and toileting  PARTICIPATION LIMITATIONS: meal prep, cleaning, laundry, driving, shopping, community activity, occupation, and yard work  PERSONAL FACTORS: 1-2 comorbidities: chronic pain, HTN  are also affecting patient's functional outcome.   REHAB POTENTIAL: Good  CLINICAL DECISION MAKING: Stable/uncomplicated  EVALUATION COMPLEXITY: Low   GOALS: Goals reviewed with patient? Yes  SHORT TERM GOALS: Target date: 11/08/2023 Pt will be independent with HEP in order to decrease ankle pain and increase strength in order to improve pain-free function at home and work.  Baseline: EVAL- No formal HEP in place; 12/10: independent  Goal status: ACHIEVED    LONG TERM GOALS: Target date: 12/06/2023  Pt will decrease worst pain as reported on NPRS by at least 3 points in order to demonstrate clinically significant  reduction in ankle/foot pain.  Baseline: EVAL- R knee pain = 7-8/10; 12/05/23: 6-7/10 worst pain  12/07/23: 6-7 over past 7 days Goal status: ONGOING  2.  Pt will improve FOTO to target score of 54 to display perceived improvements in ability to complete ADL's.  Baseline: EVAL= 39;  12/05/23: 58 12/07/23: 49 Goal status: MET  3.  Patient will improve Right knee AROM extension to <5 deg from zero for improved ability to straighten his leg with all mobility.  Baseline: EVAL= 12 deg; 12/10: ~10 degrees but still quite edematous 12/07/23: 3 deg lacking Goal status: ONGOING  4.  Patient will improve Right knee AROM Flexion to > 120 deg for optimal right knee mobility with steps and sitting.  Baseline: EVAL= 90 deg; 12/05/23:  12/07/23: 109 deg Goal status: ONGOING  5.  Patient (> 70 years old) will complete five times sit to stand test in < 15 seconds indicating an increased LE strength and improved balance. Baseline: 11/14: 31.64 sec no UE support  12/07/23: 22.91 sec with no UE Goal status: ONGOING   6.  Patient will reduce timed up and go to <11 seconds to reduce fall risk and demonstrate improved transfer/gait ability. Baseline: 11/14 18.96 sec with SPC 12/07/23: 16.52 sec without AD; 12.76 sec with SPC Goal status: INITIAL  7.  Patient will increase 10 meter walk test to >1.67m/s as to improve gait speed for better community ambulation and to reduce fall risk. Baseline: 11:14 0.70m/s 12/07/23: 0.96 m/s Goal status: ONGOING    PLAN:  PT FREQUENCY: 1-2x/week  PT DURATION: 8 weeks  PLANNED INTERVENTIONS: 97164- PT Re-evaluation, 97110-Therapeutic exercises, 97530- Therapeutic activity, 97112- Neuromuscular re-education, 97535- Self Care, [id number]- Manual therapy, [id number]- Gait training, [phone]- Orthotic Fit/training, [phone]- Electrical stimulation (manual), [phone]- Ultrasound, Patient/Family education, Balance training, Stair training, Taping, Dry Needling, Joint mobilization, Manual  lymph drainage, Scar mobilization, Compression bandaging, Cryotherapy, and Moist heat  PLAN FOR NEXT SESSION:   Manual therapy for ROM, TE for strengthening and knee stability, and continue with dynamic balance activities.     Chyrl London, PT Physical Therapist - [facility]  01/05/24, 8:18 AM

## 2024-01-04 ENCOUNTER — Ambulatory Visit: Payer: Medicaid Other

## 2024-01-04 DIAGNOSIS — M25561 Pain in right knee: Secondary | ICD-10-CM

## 2024-01-04 DIAGNOSIS — M2560 Stiffness of unspecified joint, not elsewhere classified: Secondary | ICD-10-CM

## 2024-01-04 DIAGNOSIS — Z7689 Persons encountering health services in other specified circumstances: Secondary | ICD-10-CM | POA: Diagnosis not present

## 2024-01-04 DIAGNOSIS — M25661 Stiffness of right knee, not elsewhere classified: Secondary | ICD-10-CM

## 2024-01-04 DIAGNOSIS — R262 Difficulty in walking, not elsewhere classified: Secondary | ICD-10-CM

## 2024-01-04 DIAGNOSIS — M6281 Muscle weakness (generalized): Secondary | ICD-10-CM | POA: Diagnosis not present

## 2024-01-04 DIAGNOSIS — R269 Unspecified abnormalities of gait and mobility: Secondary | ICD-10-CM

## 2024-01-05 ENCOUNTER — Telehealth: Payer: Self-pay | Admitting: *Deleted

## 2024-01-05 ENCOUNTER — Encounter: Payer: Self-pay | Admitting: Internal Medicine

## 2024-01-05 ENCOUNTER — Ambulatory Visit (INDEPENDENT_AMBULATORY_CARE_PROVIDER_SITE_OTHER): Payer: 59 | Admitting: Internal Medicine

## 2024-01-05 VITALS — BP 156/84 | HR 65 | Temp 98.6°F | Ht 70.0 in | Wt 204.2 lb

## 2024-01-05 DIAGNOSIS — K439 Ventral hernia without obstruction or gangrene: Secondary | ICD-10-CM | POA: Diagnosis not present

## 2024-01-05 DIAGNOSIS — G894 Chronic pain syndrome: Secondary | ICD-10-CM | POA: Diagnosis not present

## 2024-01-05 DIAGNOSIS — F39 Unspecified mood [affective] disorder: Secondary | ICD-10-CM

## 2024-01-05 DIAGNOSIS — Z7689 Persons encountering health services in other specified circumstances: Secondary | ICD-10-CM | POA: Diagnosis not present

## 2024-01-05 MED ORDER — BACLOFEN 10 MG PO TABS: 10.0000 mg | ORAL_TABLET | Freq: Three times a day (TID) | ORAL | 1 refills | Status: DC | PRN

## 2024-01-05 MED ORDER — HYDROMORPHONE HCL 4 MG PO TABS: 4.0000 mg | ORAL_TABLET | Freq: Three times a day (TID) | ORAL | 0 refills | Status: DC | PRN

## 2024-01-05 MED ORDER — QUETIAPINE FUMARATE 25 MG PO TABS: 25.0000 mg | ORAL_TABLET | Freq: Every evening | ORAL | 1 refills | Status: DC | PRN

## 2024-01-05 NOTE — Assessment & Plan Note (Signed)
 Mostly depression related to pain Continues on duloxetine Will renew the quetiapine---hopefully can help him sleep

## 2024-01-05 NOTE — Assessment & Plan Note (Signed)
 Given everything else that is going on--I recommend observation only Discussed lying supine with legs up if doesn't go in ER if stuck

## 2024-01-05 NOTE — Progress Notes (Signed)
 Subjective:    Patient ID: Harold Smith, male    DOB: [birth year], 58 y.o.   MRN: [id number]  HPI Here due to concern about a hernia  Is recovering from right TKR Some swelling and still doing PT  Concerned he has abdominal hernia Some tenderness Size of egg---noticed it a few days ago At the point of past surgery He can push on it--and expels air--but seems to come back up  Due for more back surgery with Dr Georgina soon Removing hardware--hopes that will help his pain Has pain management finally set in February Uses gabapentin  mostly at bedtime  Current Outpatient Medications on File Prior to Visit  Medication Sig Dispense Refill   alendronate  (FOSAMAX ) 70 MG tablet Take 1 tablet (70 mg total) by mouth once a week. Take with a full glass of water  on an empty stomach. 13 tablet 3   aspirin  81 MG chewable tablet Chew 1 tablet (81 mg total) by mouth 2 (two) times daily. 30 tablet 0   baclofen  (LIORESAL ) 10 MG tablet Take 1 tablet (10 mg total) by mouth 3 (three) times daily. 90 tablet 0   calcitRIOL  (ROCALTROL ) 0.25 MCG capsule Take 1 capsule (0.25 mcg total) by mouth daily. 90 capsule 2   CALCIUM  PO Take 1 tablet by mouth daily.     carboxymethylcellulose (REFRESH PLUS) 0.5 % SOLN Place 1 drop into both eyes daily as needed (dry eyes).     cetirizine  (ZYRTEC ) 10 MG tablet Take 1 tablet (10 mg total) by mouth daily. 90 tablet 3   Cyanocobalamin  (VITAMIN B-12 PO) Take 1 tablet by mouth daily.     cyclobenzaprine  (FLEXERIL ) 5 MG tablet Take 1-2 tablets (5-10 mg total) by mouth 3 (three) times daily as needed for muscle spasms. 90 tablet 1   dicyclomine  (BENTYL ) 10 MG capsule Take 1 to 2 capsules as needed for abdominal cramps and diarrhea every 6 to 8 hours 240 capsule 3   DULoxetine  (CYMBALTA ) 60 MG capsule TAKE 1 CAPSULE BY MOUTH ONCE DAILY 90 capsule 3   ferrous sulfate  (FEROSUL) 325 (65 FE) MG tablet TAKE ONE TABLET EACH MORNING WITH BREAKFAST 30 tablet 11   fluticasone   (FLONASE ) 50 MCG/ACT nasal spray Place 2 sprays into both nostrils daily. (Patient taking differently: Place 2 sprays into both nostrils daily as needed for allergies or rhinitis.) 48 g 3   gabapentin  (NEURONTIN ) 300 MG capsule TAKE 1 CAPSULE BY MOUTH TWICE DAILY AND TAKE 4 CAPSULES AT BEDTIME 180 capsule 11   hydrochlorothiazide  (HYDRODIURIL ) 25 MG tablet TAKE 1 TABLET BY MOUTH DAILY 90 tablet 3   hydrocortisone  (ANUSOL -HC) 25 MG suppository Place 1 suppository (25 mg total) rectally daily. 14 suppository 0   lipase/protease/amylase (CREON ) 36000 UNITS CPEP capsule Take 2 capsules (72,000 Units total) by mouth 3 (three) times daily with meals. May also take 1 capsule (36,000 Units total) as needed (with snacks). (Patient taking differently: Take 36,000 units by mouth 3 (three) times daily as needed based on meal size and type ) 240 capsule 5   lisinopril  (ZESTRIL ) 20 MG tablet TAKE 1 TABLET BY MOUTH DAILY 90 tablet 3   meloxicam  (MOBIC ) 15 MG tablet TAKE 1 TABLET BY MOUTH DAILY AS NEEDED FOR PAIN 90 tablet 1   Multiple Vitamin (MULTIVITAMIN) tablet Take 1 tablet by mouth daily.     naloxone  (NARCAN ) nasal spray 4 mg/0.1 mL Place 1 spray into the nose once.     omeprazole  (PRILOSEC) 40 MG capsule Take 1 capsule (  40 mg total) by mouth 2 (two) times daily before a meal. 180 capsule 3   QUEtiapine  (SEROQUEL ) 25 MG tablet TAKE 1-2 TABLETS BY MOUTH AT BEDTIME AS NEEDED FOR SLEEP 60 tablet 0   Sodium Fluoride (PREVIDENT 5000 BOOSTER PLUS DT) Place 1 application  onto teeth 2 (two) times daily.     tadalafil  (CIALIS ) 20 MG tablet Take 1 tablet (20 mg total) by mouth daily as needed for erectile dysfunction. 10 tablet 11   traZODone  (DESYREL ) 50 MG tablet Take 1-2 tablets (50-100 mg total) by mouth at bedtime as needed for sleep. 180 tablet 1   Vitamin D , Ergocalciferol , (DRISDOL ) 1.25 MG (50000 UNIT) CAPS capsule Take 1 capsule (50,000 Units total) by mouth every 7 (seven) days. 13 capsule 2   No current  facility-administered medications on file prior to visit.    No Known Allergies  Past Medical History:  Diagnosis Date   Allergic rhinitis due to pollen    Anemia    Anxiety    Chronic pain    Colon polyps    Depression    Diverticulitis large intestine    Erosive esophagitis    GERD (gastroesophageal reflux disease)    HTN (hypertension)    Irritable bowel syndrome    Metabolic bone disease 09/01/2022   Obesity    Osteoarthritis, knee    Sleep apnea    does not use cpap, pt states he no longer needed CPAP due to weight loss   Sleep disturbance    Vitamin D  deficiency 09/01/2022    Past Surgical History:  Procedure Laterality Date   ABDOMINAL EXPOSURE N/A 03/06/2023   Procedure: ABDOMINAL EXPOSURE;  Surgeon: Gretta Lonni PARAS, MD;  Location: [facility] OR;  Service: Vascular;  Laterality: N/A;   ACHILLES TENDON REPAIR Right 12/26/2006   ANTERIOR LUMBAR FUSION N/A 03/06/2023   Procedure: L5-S1 ANTERIOR LUMBAR FUSION 1 LEVEL;  Surgeon: Georgina Ozell LABOR, MD;  Location: [facility] OR;  Service: Orthopedics;  Laterality: N/A;   BIOPSY  08/29/2023   Procedure: BIOPSY;  Surgeon: Unk Corinn Skiff, MD;  Location: [facility] ENDOSCOPY;  Service: Gastroenterology;;   CATARACT EXTRACTION W/PHACO Left 07/20/2021   Procedure: CATARACT EXTRACTION PHACO AND INTRAOCULAR LENS PLACEMENT (IOC) LEFT 2.11 00:24.0;  Surgeon: Jaye Fallow, MD;  Location: [facility];  Service: Ophthalmology;  Laterality: Left;  sleep apnea   CATARACT EXTRACTION W/PHACO Right 08/03/2021   Procedure: CATARACT EXTRACTION PHACO AND INTRAOCULAR LENS PLACEMENT (IOC) RIGHT;  Surgeon: Jaye Fallow, MD;  Location: [facility];  Service: Ophthalmology;  Laterality: Right;  3.19 0:33.0   CHOLECYSTECTOMY     COLON RESECTION Left 12/26/2004   due to diverticular disease at [facility]   COLONOSCOPY  2013?   COLONOSCOPY N/A 11/30/2022   Procedure: COLONOSCOPY;  Surgeon: Unk Corinn Skiff, MD;  Location: [facility] ENDOSCOPY;   Service: Gastroenterology;  Laterality: N/A;   COLONOSCOPY WITH PROPOFOL  N/A 05/22/2018   Procedure: COLONOSCOPY WITH PROPOFOL ;  Surgeon: Unk Corinn Skiff, MD;  Location: [facility] ENDOSCOPY;  Service: Gastroenterology;  Laterality: N/A;   COLONOSCOPY WITH PROPOFOL  N/A 10/22/2020   Procedure: COLONOSCOPY WITH PROPOFOL ;  Surgeon: Unk Corinn Skiff, MD;  Location: [facility];  Service: Endoscopy;  Laterality: N/A;  priority 4   COLONOSCOPY WITH PROPOFOL  N/A 11/10/2021   Procedure: COLONOSCOPY WITH PROPOFOL ;  Surgeon: Unk Corinn Skiff, MD;  Location: [facility] ENDOSCOPY;  Service: Gastroenterology;  Laterality: N/A;   COLONOSCOPY WITH PROPOFOL  N/A 06/16/2022   Procedure: COLONOSCOPY WITH PROPOFOL ;  Surgeon: Unk Corinn Skiff, MD;  Location: [facility];  Service: Endoscopy;  Laterality: N/A;   COLONOSCOPY WITH PROPOFOL  N/A 11/29/2022   Procedure: COLONOSCOPY WITH PROPOFOL ;  Surgeon: Unk Corinn Skiff, MD;  Location: [facility] ENDOSCOPY;  Service: Gastroenterology;  Laterality: N/A;   DRUG INDUCED ENDOSCOPY N/A 06/10/2019   Procedure: DRUG INDUCED SLEEP ENDOSCOPY;  Surgeon: Mable Lenis, MD;  Location: [facility];  Service: ENT;  Laterality: N/A;   ESOPHAGOGASTRODUODENOSCOPY (EGD) WITH PROPOFOL  N/A 06/16/2022   Procedure: ESOPHAGOGASTRODUODENOSCOPY (EGD) WITH PROPOFOL ;  Surgeon: Unk Corinn Skiff, MD;  Location: [facility];  Service: Endoscopy;  Laterality: N/A;   ESOPHAGOGASTRODUODENOSCOPY (EGD) WITH PROPOFOL  N/A 05/24/2023   Procedure: ESOPHAGOGASTRODUODENOSCOPY (EGD) WITH PROPOFOL ;  Surgeon: Unk Corinn Skiff, MD;  Location: [facility] ENDOSCOPY;  Service: Gastroenterology;  Laterality: N/A;   ESOPHAGOGASTRODUODENOSCOPY (EGD) WITH PROPOFOL  N/A 08/29/2023   Procedure: ESOPHAGOGASTRODUODENOSCOPY (EGD) WITH PROPOFOL ;  Surgeon: Unk Corinn Skiff, MD;  Location: [facility] ENDOSCOPY;  Service: Gastroenterology;  Laterality: N/A;   GASTRIC BYPASS N/A 12/27/2007    HARDWARE REMOVAL Right 06/22/2023   Procedure: HARDWARE REMOVAL;  Surgeon: Celena Sharper, MD;  Location: [facility] OR;  Service: Orthopedics;  Laterality: Right;   HIATAL HERNIA REPAIR  09/25/2014   at [facility]   KNEE ARTHROSCOPY Right 06/22/2023   Procedure: ARTHROSCOPY KNEE;  Surgeon: Celena Sharper, MD;  Location: [facility] OR;  Service: Orthopedics;  Laterality: Right;   ORIF FEMUR FRACTURE Right 08/30/2022   Procedure: OPEN REDUCTION INTERNAL FIXATION (ORIF) DISTAL FEMUR FRACTURE;  Surgeon: Celena Sharper, MD;  Location: [facility] OR;  Service: Orthopedics;  Laterality: Right;   POLYPECTOMY  10/22/2020   Procedure: POLYPECTOMY;  Surgeon: Unk Corinn Skiff, MD;  Location: [facility];  Service: Endoscopy;;   POLYPECTOMY  06/16/2022   Procedure: POLYPECTOMY;  Surgeon: Unk Corinn Skiff, MD;  Location: [facility];  Service: Endoscopy;;   ROTATOR CUFF REPAIR Right    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   TOTAL KNEE ARTHROPLASTY Right 09/19/2023   Procedure: RIGHT TOTAL KNEE ARTHROPLASTY;  Surgeon: Vernetta Lonni GRADE, MD;  Location: [facility] OR;  Service: Orthopedics;  Laterality: Right;    Family History  Problem Relation Age of Onset   Hypertension Mother    Diabetes Mother    Kidney disease Mother    Heart disease Mother    Colon cancer Father 20   Asthma Sister    Obesity Sister    Stroke Sister    Obesity Brother    Diabetes Other    Heart disease Other    Hypertension Other    Kidney disease Other    Cancer Paternal Uncle        unk type, possible prostate   Lung cancer Paternal Grandmother     Social History   Socioeconomic History   Marital status: Single    Spouse name: Not on file   Number of children: 3   Years of education: Not on file   Highest education level: Some college, no degree  Occupational History   Occupation: Hydrographic surveyor  Tobacco Use   Smoking status: Some Days    Types: Cigars    Passive exposure: Past   Smokeless  tobacco: Never   Tobacco comments:    occasional cigar  Vaping Use   Vaping status: Never Used  Substance and Sexual Activity   Alcohol use: Yes    Comment: Occasionally   Drug use: No   Sexual activity: Yes  Other Topics Concern   Not on file  Social History Narrative  Not on file   Social Drivers of Health   Financial Resource Strain: Medium Risk (01/04/2024)   Overall Financial Resource Strain (CARDIA)    Difficulty of Paying Living Expenses: Somewhat hard  Food Insecurity: Food Insecurity Present (01/04/2024)   Hunger Vital Sign    Worried About Running Out of Food in the Last Year: Sometimes true    Ran Out of Food in the Last Year: Sometimes true  Transportation Needs: No Transportation Needs (01/04/2024)   PRAPARE - Administrator, Civil Service (Medical): No    Lack of Transportation (Non-Medical): No  Physical Activity: Unknown (01/04/2024)   Exercise Vital Sign    Days of Exercise per Week: 0 days    Minutes of Exercise per Session: Not on file  Stress: Stress Concern Present (01/04/2024)   [employer] of Occupational Health - Occupational Stress Questionnaire    Feeling of Stress : Very much  Social Connections: Moderately Integrated (01/04/2024)   Social Connection and Isolation Panel [NHANES]    Frequency of Communication with Friends and Family: Twice a week    Frequency of Social Gatherings with Friends and Family: Twice a week    Attends Religious Services: 1 to 4 times per year    Active Member of [employer] or Organizations: No    Attends Engineer, Structural: 1 to 4 times per year    Marital Status: Divorced  Catering Manager Violence: Not At Risk (03/06/2023)   Humiliation, Afraid, Rape, and Kick questionnaire    Fear of Current or Ex-Partner: No    Emotionally Abused: No    Physically Abused: No    Sexually Abused: No   Review of Systems Has gained back some weight Still not sleeping well     Objective:   Physical  Exam Constitutional:      Appearance: Normal appearance.  Abdominal:     Comments: ~4cm ventral hernia under and to left of umbilicus (along midline scar) Reducible even when standing (though slight discomfort)  Neurological:     Mental Status: He is alert.            Assessment & Plan:

## 2024-01-05 NOTE — Telephone Encounter (Signed)
Letter printed an mailed

## 2024-01-05 NOTE — Assessment & Plan Note (Addendum)
 Will renew his hydromorphone awaiting pain appointment baclofen duloxetine

## 2024-01-05 NOTE — Telephone Encounter (Signed)
 Copied from CRM [id number]. Topic: General - Other >> Jan 05, 2024 11:41 AM Susanna ORN wrote: Reason for CRM: Patient is needing a note stating that he was seen in the office today, 01/05/24 with Dr. Jimmy. HE asks if the note could be mailed to him. Pt verified his address which is the correct address listed in chart.

## 2024-01-08 ENCOUNTER — Encounter: Payer: 59 | Admitting: Orthopedic Surgery

## 2024-01-09 ENCOUNTER — Ambulatory Visit: Payer: Medicaid Other

## 2024-01-09 DIAGNOSIS — R269 Unspecified abnormalities of gait and mobility: Secondary | ICD-10-CM

## 2024-01-09 DIAGNOSIS — M25661 Stiffness of right knee, not elsewhere classified: Secondary | ICD-10-CM

## 2024-01-09 DIAGNOSIS — R262 Difficulty in walking, not elsewhere classified: Secondary | ICD-10-CM

## 2024-01-09 DIAGNOSIS — M6281 Muscle weakness (generalized): Secondary | ICD-10-CM | POA: Diagnosis not present

## 2024-01-09 DIAGNOSIS — M2560 Stiffness of unspecified joint, not elsewhere classified: Secondary | ICD-10-CM

## 2024-01-09 DIAGNOSIS — M25561 Pain in right knee: Secondary | ICD-10-CM

## 2024-01-09 NOTE — Therapy (Signed)
 OUTPATIENT PHYSICAL THERAPY TREATMENT   Patient Name: Harold Smith MRN: [id number] DOB:[birth year], 58 y.o., male Today's Date: 01/09/2024  END OF SESSION:  PT End of Session - 01/09/24 1024     Visit Number 19    Number of Visits 26    Date for PT Re-Evaluation 01/31/24    Authorization Type [employer] 2024    Authorization - Number of Visits 30    Progress Note Due on Visit 20    PT Start Time 1017    PT Stop Time 1059    PT Time Calculation (min) 42 min    Equipment Utilized During Treatment Gait belt    Activity Tolerance Patient tolerated treatment well;No increased pain    Behavior During Therapy WFL for tasks assessed/performed                Past Medical History:  Diagnosis Date   Allergic rhinitis due to pollen    Anemia    Anxiety    Chronic pain    Colon polyps    Depression    Diverticulitis large intestine    Erosive esophagitis    GERD (gastroesophageal reflux disease)    HTN (hypertension)    Irritable bowel syndrome    Metabolic bone disease 09/01/2022   Obesity    Osteoarthritis, knee    Sleep apnea    does not use cpap, pt states he no longer needed CPAP due to weight loss   Sleep disturbance    Vitamin D  deficiency 09/01/2022   Past Surgical History:  Procedure Laterality Date   ABDOMINAL EXPOSURE N/A 03/06/2023   Procedure: ABDOMINAL EXPOSURE;  Surgeon: Gretta Lonni PARAS, MD;  Location: [facility] OR;  Service: Vascular;  Laterality: N/A;   ACHILLES TENDON REPAIR Right 12/26/2006   ANTERIOR LUMBAR FUSION N/A 03/06/2023   Procedure: L5-S1 ANTERIOR LUMBAR FUSION 1 LEVEL;  Surgeon: Georgina Ozell LABOR, MD;  Location: [facility] OR;  Service: Orthopedics;  Laterality: N/A;   BIOPSY  08/29/2023   Procedure: BIOPSY;  Surgeon: Unk Corinn Skiff, MD;  Location: [facility] ENDOSCOPY;  Service: Gastroenterology;;   CATARACT EXTRACTION W/PHACO Left 07/20/2021   Procedure: CATARACT EXTRACTION PHACO AND INTRAOCULAR LENS PLACEMENT (IOC) LEFT 2.11  00:24.0;  Surgeon: Jaye Fallow, MD;  Location: [facility] SURGERY CNTR;  Service: Ophthalmology;  Laterality: Left;  sleep apnea   CATARACT EXTRACTION W/PHACO Right 08/03/2021   Procedure: CATARACT EXTRACTION PHACO AND INTRAOCULAR LENS PLACEMENT (IOC) RIGHT;  Surgeon: Jaye Fallow, MD;  Location: [facility] SURGERY [facility]TR;  Service: Ophthalmology;  Laterality: Right;  3.19 0:33.0   CHOLECYSTECTOMY     COLON RESECTION Left 12/26/2004   due to diverticular disease at [facility]   COLONOSCOPY  2013?   COLONOSCOPY N/A 11/30/2022   Procedure: COLONOSCOPY;  Surgeon: Unk Corinn Skiff, MD;  Location: [facility] ENDOSCOPY;  Service: Gastroenterology;  Laterality: N/A;   COLONOSCOPY WITH PROPOFOL  N/A 05/22/2018   Procedure: COLONOSCOPY WITH PROPOFOL ;  Surgeon: Unk Corinn Skiff, MD;  Location: [facility] ENDOSCOPY;  Service: Gastroenterology;  Laterality: N/A;   COLONOSCOPY WITH PROPOFOL  N/A 10/22/2020   Procedure: COLONOSCOPY WITH PROPOFOL ;  Surgeon: Unk Corinn Skiff, MD;  Location: [facility] SURGERY CNTR;  Service: Endoscopy;  Laterality: N/A;  priority 4   COLONOSCOPY WITH PROPOFOL  N/A 11/10/2021   Procedure: COLONOSCOPY WITH PROPOFOL ;  Surgeon: Unk Corinn Skiff, MD;  Location: [facility] ENDOSCOPY;  Service: Gastroenterology;  Laterality: N/A;   COLONOSCOPY WITH PROPOFOL  N/A 06/16/2022   Procedure: COLONOSCOPY WITH PROPOFOL ;  Surgeon: Unk Corinn Skiff, MD;  Location: [facility] SURGERY  CNTR;  Service: Endoscopy;  Laterality: N/A;   COLONOSCOPY WITH PROPOFOL  N/A 11/29/2022   Procedure: COLONOSCOPY WITH PROPOFOL ;  Surgeon: Unk Corinn Skiff, MD;  Location: [facility] ENDOSCOPY;  Service: Gastroenterology;  Laterality: N/A;   DRUG INDUCED ENDOSCOPY N/A 06/10/2019   Procedure: DRUG INDUCED SLEEP ENDOSCOPY;  Surgeon: Mable Lenis, MD;  Location: [facility];  Service: ENT;  Laterality: N/A;   ESOPHAGOGASTRODUODENOSCOPY (EGD) WITH PROPOFOL  N/A 06/16/2022   Procedure: ESOPHAGOGASTRODUODENOSCOPY (EGD) WITH  PROPOFOL ;  Surgeon: Unk Corinn Skiff, MD;  Location: [facility] SURGERY CNTR;  Service: Endoscopy;  Laterality: N/A;   ESOPHAGOGASTRODUODENOSCOPY (EGD) WITH PROPOFOL  N/A 05/24/2023   Procedure: ESOPHAGOGASTRODUODENOSCOPY (EGD) WITH PROPOFOL ;  Surgeon: Unk Corinn Skiff, MD;  Location: [facility] ENDOSCOPY;  Service: Gastroenterology;  Laterality: N/A;   ESOPHAGOGASTRODUODENOSCOPY (EGD) WITH PROPOFOL  N/A 08/29/2023   Procedure: ESOPHAGOGASTRODUODENOSCOPY (EGD) WITH PROPOFOL ;  Surgeon: Unk Corinn Skiff, MD;  Location: [facility] ENDOSCOPY;  Service: Gastroenterology;  Laterality: N/A;   GASTRIC BYPASS N/A 12/27/2007   HARDWARE REMOVAL Right 06/22/2023   Procedure: HARDWARE REMOVAL;  Surgeon: Celena Sharper, MD;  Location: [facility] OR;  Service: Orthopedics;  Laterality: Right;   HIATAL HERNIA REPAIR  09/25/2014   at [facility]   KNEE ARTHROSCOPY Right 06/22/2023   Procedure: ARTHROSCOPY KNEE;  Surgeon: Celena Sharper, MD;  Location: [facility] OR;  Service: Orthopedics;  Laterality: Right;   ORIF FEMUR FRACTURE Right 08/30/2022   Procedure: OPEN REDUCTION INTERNAL FIXATION (ORIF) DISTAL FEMUR FRACTURE;  Surgeon: Celena Sharper, MD;  Location: [facility] OR;  Service: Orthopedics;  Laterality: Right;   POLYPECTOMY  10/22/2020   Procedure: POLYPECTOMY;  Surgeon: Unk Corinn Skiff, MD;  Location: [facility] SURGERY CNTR;  Service: Endoscopy;;   POLYPECTOMY  06/16/2022   Procedure: POLYPECTOMY;  Surgeon: Unk Corinn Skiff, MD;  Location: [facility] SURGERY [facility];  Service: Endoscopy;;   ROTATOR CUFF REPAIR Right    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   TOTAL KNEE ARTHROPLASTY Right 09/19/2023   Procedure: RIGHT TOTAL KNEE ARTHROPLASTY;  Surgeon: Vernetta Lonni GRADE, MD;  Location: [facility] OR;  Service: Orthopedics;  Laterality: Right;   Patient Active Problem List   Diagnosis Date Noted   Ventral hernia without obstruction or gangrene 01/05/2024   Secondary hyperparathyroidism, non-renal (HCC) 10/25/2023   Age-related  osteoporosis without current pathological fracture 10/23/2023   S/P total knee arthroplasty, right 09/19/2023   Status post total right knee replacement 09/19/2023   Chronic pain syndrome 07/12/2023   Anxiety 09/01/2022   Vitamin D  deficiency 09/01/2022   Metabolic bone disease  09/01/2022   Displaced supracondylar fracture of distal end of right femur without intracondylar extension (HCC) 08/29/2022   Mood disorder (HCC) 07/08/2022   Disability of walking 06/01/2022   Degeneration of lumbar intervertebral disc 06/01/2022   Iron  deficiency anemia 05/10/2022   Lumbar spondylolysis 02/25/2022   Sleep disturbance 07/02/2021   Preventative health care 06/23/2018   Personal hx of gastric bypass 06/23/2018   Erectile dysfunction 06/23/2018   Hx of adenomatous colonic polyps    Essential hypertension 10/17/2013   Diverticulosis large intestine w/o perforation or abscess w/bleeding    Colon polyps     PCP: Dr. Charlie Denise  REFERRING PROVIDER: Dr. Lonni Vernetta  REFERRING DIAG: [phone] (ICD-10-CM) - Status post total right knee replacement   THERAPY DIAG:  Muscle weakness (generalized)  Limited joint range of motion (ROM)  Difficulty in walking, not elsewhere classified  Abnormality of gait and mobility  Acute pain of right knee  Stiffness of right knee, not elsewhere  classified  Rationale for Evaluation and Treatment: Rehabilitation  ONSET DATE: 09/19/2023  SUBJECTIVE:   SUBJECTIVE STATEMENT:  Patient reports Knee pain is improving. Reports ongoing abdominal and back issues but states he is willing to do what he can do.    PERTINENT HISTORY:  Per ortho note from Dr. Vernetta: Mirian Georgena Dines, 58 y.o. male, has a history of pain and functional disability in the right knee due to trauma and has failed non-surgical conservative treatments for greater than 12 weeks to include NSAID's and/or analgesics, corticosteriod injections, use of assistive devices, and  activity modification.  Onset of symptoms was abrupt, starting 1 years ago with gradually worsening course since that time. The patient noted prior procedures on the knee to include  ORIF  on the right knee(s).  Per previous admission chart review from Camie Dine, PT, DPT: Relevant past medical history and comorbidities include s/p hardware removal and arthroscopy of R knee on 06/22/2023 (hardware originally for distal medial condyle fracture);  s/p L5-S1 ANTERIOR LUMBAR FUSION on 03/06/2023; Diverticulosis large intestine w/o perforation or abscess w/bleeding; Essential hypertension; Rectal bleeding; Hx of adenomatous colonic polyps; Personal hx of gastric bypass; Erectile dysfunction; Sleep disturbance; Lumbar spondylolysis; Microcytic anemia; Iron  deficiency anemia; Numbness and tingling; Imbalance; Difficulty sleeping; Disability of walking; Pain in joint involving ankle and foot; Degeneration of lumbar intervertebral disc; Erosive esophagitis; Mood disorder (HCC); Displaced supracondylar fracture of distal end of right femur without intracondylar extension (HCC); Anxiety; Vitamin D  deficiency; Metabolic bone disease; and Radiculopathy, lumbar region. Please see above for relevant surgery history; osteoporosis, bowel/urinary incontinence, stumbling and dropping things. Patient denies hx of cancer, stroke, seizures, lung problems, heart problems, diabetes, and unexplained weight loss.   PAIN:  Are you having pain? Yes 3-4/10 knee  PRECAUTIONS: Knee  RED FLAGS: Bowel or bladder incontinence: Yes: bowel     WEIGHT BEARING RESTRICTIONS: No  FALLS:  Has patient fallen in last 6 months? 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  NEXT MD VISIT: Next month- Dr. Vernetta  OBJECTIVE:  Note: Objective measures were completed at Evaluation unless otherwise noted.  DIAGNOSTIC FINDINGS:  Narrative & Impression  CLINICAL DATA:  Status post right knee replacement.   EXAM: PORTABLE RIGHT KNEE - 1-2 VIEW   COMPARISON:  06/22/2023   FINDINGS: Right knee arthroplasty in expected alignment. No periprosthetic lucency or fracture. There has been patellar resurfacing. Recent postsurgical change includes air and edema in the soft tissues and joint space. Chronic ghost tracks in the distal femur. Anterior skin staples in place.   IMPRESSION: Right knee arthroplasty without immediate postoperative complication.     Electronically Signed   By: Andrea Gasman M.D.   On: 09/19/2023 16:43    PATIENT SURVEYS:  FOTO 39 with goal of 54  COGNITION: Overall cognitive status: Within functional limits for tasks assessed     SENSATION: Light touch: Impaired - reported dead zone lateral joint line  EDEMA:  Circumferential:  1)bottom of incision=38; 2)10 cm above distal incision-=48, 3) top of incision= 49    POSTURE: No Significant postural limitations  PALPATION: Global tenderness along right knee- joint line and posterior knee  LOWER EXTREMITY  ROM:  Active ROM Right eval Left eval  Hip flexion    Hip extension    Hip abduction    Hip adduction    Hip internal rotation    Hip external rotation    Knee flexion 90deg   Knee extension Lack 12 deg   Ankle dorsiflexion    Ankle plantarflexion    Ankle inversion    Ankle eversion     (Blank rows = not tested)    TODAY'S TREATMENT: DATE: 01/09/24   Progressive Nustep- L1-L4  x 5 minutes - Able to progressing add more resistance without any pain- continuously monitoring for pain and exertion level  Standing knee flex stretch (foot on 12 in step in //  bars) - hold 10 sec x 15 reps.   Standing Calf  stretch at support bar- hold 15 sec x 4  Step ups fwd- x 15 reps (0 weight to avoid any increase strain to abdominal region)   Lateral step up x 15 reps on RLE  Eccentric step down- 15 reps (focusing on slow controlled motion with RLE)   Side step squat- 12 reps each LE .  NMR:   Single leg standing on airex pad - attempting to hold 3-10 reps  on RLE  Tandem standing on airex pad - hold 30 sec x multiple attempts             PATIENT EDUCATION:  Education details: Purpose of PT, HEP, Pain management strategies including Ice, ROM, taking meds as prescribed. Pt educated throughout session about proper posture and technique with exercises. Improved exercise technique, movement at target joints, use of target muscles after min to mod verbal, visual, tactile cues.   Person educated: Patient Education method: Explanation, Demonstration, Tactile cues, Verbal cues, and Handouts Education comprehension: verbalized understanding, returned demonstration, verbal cues required, tactile cues required, and needs further education  HOME EXERCISE PROGRAM: Access Code: [email or web address] URL: [email or web address] Date: 10/25/2023 Prepared by: Reyes London  Exercises - Standing Hip Flexion March  - 1 x daily - 7 x weekly - 3 sets - 10 reps - Standing Hip Abduction with Counter Support  - 1 x daily - 7 x weekly - 3 sets - 10 reps - Standing Hip Extension with Counter Support  - 1 x daily - 7 x weekly - 3 sets - 10 reps - Standing Knee Flexion with Unilateral Counter Support  - 1 x daily - 3 sets - 10 reps - Mini Squat with Counter Support  - 1 x daily - 3 sets - 10 reps - Heel Toe Raises with Unilateral Counter Support  - 1 x daily - 1 x weekly - 3 sets - 10 reps - Toe Raises with Counter Support  - 1 x daily - 1 x weekly - 3 sets - 10 reps  Access Code: [facility] URL: [email or web address] Date:  10/11/2023 Prepared by: Reyes London  Exercises - Seated Hamstring Stretch  - 2 x daily - 3-5 sets - 30-45 hold - Seated Knee Flexion Stretch  - 2 x daily - 3-5 sets - 30-45 hold  ASSESSMENT:  CLINICAL IMPRESSION:  Treatment continues to be modified to avoid any abdominal pain or excessive low back pain. He again performed well- able to demo improvement with all balance activities. He exhibited no pain with step ups and walking with less overall antalgia in and out of clinic.  Patient will continue to benefit from skilled therapy to address remaining deficits in order to improve quality of life and  return to PLOF.     OBJECTIVE IMPAIRMENTS: Abnormal gait, decreased activity tolerance, decreased balance, decreased endurance, decreased mobility, difficulty walking, decreased ROM, decreased strength, hypomobility, impaired flexibility, and pain.   ACTIVITY LIMITATIONS: carrying, lifting, bending, sitting, standing, squatting, sleeping, stairs, transfers, bed mobility, and toileting  PARTICIPATION LIMITATIONS: meal prep, cleaning, laundry, driving, shopping, community activity, occupation, and yard work  PERSONAL FACTORS: 1-2 comorbidities: chronic pain, HTN  are also affecting patient's functional outcome.   REHAB POTENTIAL: Good  CLINICAL DECISION MAKING: Stable/uncomplicated  EVALUATION COMPLEXITY: Low   GOALS: Goals reviewed with patient? Yes  SHORT TERM GOALS: Target date: 11/08/2023 Pt will be independent with HEP in order to decrease ankle pain and increase strength in order to improve pain-free function at home and work.  Baseline: EVAL- No formal HEP in place; 12/10: independent  Goal status: ACHIEVED    LONG TERM GOALS: Target date: 12/06/2023  Pt will decrease worst pain as reported on NPRS by at least 3 points in order to demonstrate clinically significant reduction in ankle/foot pain.  Baseline: EVAL- R knee pain = 7-8/10; 12/05/23: 6-7/10 worst pain  12/07/23:  6-7 over past 7 days Goal status: ONGOING  2.  Pt will improve FOTO to target score of 54 to display perceived improvements in ability to complete ADL's.  Baseline: EVAL= 39;  12/05/23: 58 12/07/23: 49 Goal status: MET  3.  Patient will improve Right knee AROM extension to <5 deg from zero for improved ability to straighten his leg with all mobility.  Baseline: EVAL= 12 deg; 12/10: ~10 degrees but still quite edematous 12/07/23: 3 deg lacking Goal status: ONGOING  4.  Patient will improve Right knee AROM Flexion to > 120 deg for optimal right knee mobility with steps and sitting.  Baseline: EVAL= 90 deg; 12/05/23:  12/07/23: 109 deg Goal status: ONGOING  5.  Patient (> 32 years old) will complete five times sit to stand test in < 15 seconds indicating an increased LE strength and improved balance. Baseline: 11/14: 31.64 sec no UE support  12/07/23: 22.91 sec with no UE Goal status: ONGOING   6.  Patient will reduce timed up and go to <11 seconds to reduce fall risk and demonstrate improved transfer/gait ability. Baseline: 11/14 18.96 sec with SPC 12/07/23: 16.52 sec without AD; 12.76 sec with SPC Goal status: INITIAL  7.  Patient will increase 10 meter walk test to >1.65m/s as to improve gait speed for better community ambulation and to reduce fall risk. Baseline: 11:14 0.72m/s 12/07/23: 0.96 m/s Goal status: ONGOING    PLAN:  PT FREQUENCY: 1-2x/week  PT DURATION: 8 weeks  PLANNED INTERVENTIONS: [id number]- PT Re-evaluation, 97110-Therapeutic exercises, 97530- Therapeutic activity, 97112- Neuromuscular re-education, 97535- Self Care, [id number]- Manual therapy, [id number]- Gait training, [phone]- Orthotic Fit/training, [phone]- Electrical stimulation (manual), [phone]- Ultrasound, Patient/Family education, Balance training, Stair training, Taping, Dry Needling, Joint mobilization, Manual lymph drainage, Scar mobilization, Compression bandaging, Cryotherapy, and Moist heat  PLAN FOR NEXT SESSION:    Manual therapy for ROM, TE for strengthening and knee stability, and continue with dynamic balance activities.     Chyrl London, PT Physical Therapist - [facility]  01/09/24, 2:43 PM

## 2024-01-10 ENCOUNTER — Ambulatory Visit: Payer: Medicaid Other | Admitting: Physical Therapy

## 2024-01-10 DIAGNOSIS — Z7689 Persons encountering health services in other specified circumstances: Secondary | ICD-10-CM | POA: Diagnosis not present

## 2024-01-10 DIAGNOSIS — R262 Difficulty in walking, not elsewhere classified: Secondary | ICD-10-CM

## 2024-01-10 DIAGNOSIS — M6281 Muscle weakness (generalized): Secondary | ICD-10-CM

## 2024-01-10 DIAGNOSIS — M2560 Stiffness of unspecified joint, not elsewhere classified: Secondary | ICD-10-CM

## 2024-01-10 DIAGNOSIS — M25561 Pain in right knee: Secondary | ICD-10-CM

## 2024-01-10 DIAGNOSIS — R269 Unspecified abnormalities of gait and mobility: Secondary | ICD-10-CM

## 2024-01-10 DIAGNOSIS — M25661 Stiffness of right knee, not elsewhere classified: Secondary | ICD-10-CM

## 2024-01-10 NOTE — Therapy (Signed)
 OUTPATIENT PHYSICAL THERAPY TREATMENT/  PHYSICAL THERAPY PROGRESS NOTE   Dates of reporting period  12/07/23   to   01/10/2024     Patient Name: Harold Smith MRN: [id number] DOB:12-11-1966, 58 y.o., male Today's Date: 01/10/2024  END OF SESSION:  PT End of Session - 01/10/24 1015     Visit Number 20    Number of Visits 26    Date for PT Re-Evaluation 01/31/24    Authorization Type [employer] 2024    Authorization - Number of Visits 30    Progress Note Due on Visit 20    PT Start Time 1019    PT Stop Time 1100    PT Time Calculation (min) 41 min    Equipment Utilized During Treatment Gait belt    Activity Tolerance Patient tolerated treatment well;No increased pain    Behavior During Therapy WFL for tasks assessed/performed                Past Medical History:  Diagnosis Date   Allergic rhinitis due to pollen    Anemia    Anxiety    Chronic pain    Colon polyps    Depression    Diverticulitis large intestine    Erosive esophagitis    GERD (gastroesophageal reflux disease)    HTN (hypertension)    Irritable bowel syndrome    Metabolic bone disease 09/01/2022   Obesity    Osteoarthritis, knee    Sleep apnea    does not use cpap, pt states he no longer needed CPAP due to weight loss   Sleep disturbance    Vitamin D  deficiency 09/01/2022   Past Surgical History:  Procedure Laterality Date   ABDOMINAL EXPOSURE N/A 03/06/2023   Procedure: ABDOMINAL EXPOSURE;  Surgeon: Young Hensen, MD;  Location: [facility] OR;  Service: Vascular;  Laterality: N/A;   ACHILLES TENDON REPAIR Right 12/26/2006   ANTERIOR LUMBAR FUSION N/A 03/06/2023   Procedure: L5-S1 ANTERIOR LUMBAR FUSION 1 LEVEL;  Surgeon: Diedra Fowler, MD;  Location: [facility] OR;  Service: Orthopedics;  Laterality: N/A;   BIOPSY  08/29/2023   Procedure: BIOPSY;  Surgeon: Selena Daily, MD;  Location: [facility] ENDOSCOPY;  Service: Gastroenterology;;   CATARACT EXTRACTION W/PHACO Left  07/20/2021   Procedure: CATARACT EXTRACTION PHACO AND INTRAOCULAR LENS PLACEMENT (IOC) LEFT 2.11 00:24.0;  Surgeon: Clair Crews, MD;  Location: [facility] SURGERY CNTR;  Service: Ophthalmology;  Laterality: Left;  sleep apnea   CATARACT EXTRACTION W/PHACO Right 08/03/2021   Procedure: CATARACT EXTRACTION PHACO AND INTRAOCULAR LENS PLACEMENT (IOC) RIGHT;  Surgeon: Clair Crews, MD;  Location: [facility] SURGERY [facility]TR;  Service: Ophthalmology;  Laterality: Right;  3.19 0:33.0   CHOLECYSTECTOMY     COLON RESECTION Left 12/26/2004   due to diverticular disease at [facility]   COLONOSCOPY  2013?   COLONOSCOPY N/A 11/30/2022   Procedure: COLONOSCOPY;  Surgeon: Selena Daily, MD;  Location: [facility] ENDOSCOPY;  Service: Gastroenterology;  Laterality: N/A;   COLONOSCOPY WITH PROPOFOL  N/A 05/22/2018   Procedure: COLONOSCOPY WITH PROPOFOL ;  Surgeon: Selena Daily, MD;  Location: [facility] ENDOSCOPY;  Service: Gastroenterology;  Laterality: N/A;   COLONOSCOPY WITH PROPOFOL  N/A 10/22/2020   Procedure: COLONOSCOPY WITH PROPOFOL ;  Surgeon: Selena Daily, MD;  Location: [facility] SUR[facility]ERY [facility];  Service: Endoscopy;  Laterality: N/A;  priority 4   COLONOSCOPY WITH PROPOFOL  N/A 11/10/2021   Procedure: COLONOSCOPY WITH PROPOFOL ;  Surgeon: Selena Daily, MD;  Location: [facility] ENDOSCOPY;  Service: Gastroenterology;  Laterality: N/A;  COLONOSCOPY WITH PROPOFOL  N/A 06/16/2022   Procedure: COLONOSCOPY WITH PROPOFOL ;  Surgeon: Selena Daily, MD;  Location: [facility] SURGERY [facility];  Service: Endoscopy;  Laterality: N/A;   COLONOSCOPY WITH PROPOFOL  N/A 11/29/2022   Procedure: COLONOSCOPY WITH PROPOFOL ;  Surgeon: Selena Daily, MD;  Location: [facility] ENDOSCOPY;  Service: Gastroenterology;  Laterality: N/A;   DRUG INDUCED ENDOSCOPY N/A 06/10/2019   Procedure: DRUG INDUCED SLEEP ENDOSCOPY;  Surgeon: Ammon Bales, MD;  Location: [facility];  Service: ENT;  Laterality: N/A;    ESOPHAGOGASTRODUODENOSCOPY (EGD) WITH PROPOFOL  N/A 06/16/2022   Procedure: ESOPHAGOGASTRODUODENOSCOPY (EGD) WITH PROPOFOL ;  Surgeon: Selena Daily, MD;  Location: [facility] SURGERY [facility]TR;  Service: Endoscopy;  Laterality: N/A;   ESOPHAGOGASTRODUODENOSCOPY (EGD) WITH PROPOFOL  N/A 05/24/2023   Procedure: ESOPHAGOGASTRODUODENOSCOPY (EGD) WITH PROPOFOL ;  Surgeon: Selena Daily, MD;  Location: [facility] ENDOSCOPY;  Service: Gastroenterology;  Laterality: N/A;   ESOPHAGOGASTRODUODENOSCOPY (EGD) WITH PROPOFOL  N/A 08/29/2023   Procedure: ESOPHAGOGASTRODUODENOSCOPY (EGD) WITH PROPOFOL ;  Surgeon: Selena Daily, MD;  Location: [facility] ENDOSCOPY;  Service: Gastroenterology;  Laterality: N/A;   GASTRIC BYPASS N/A 12/27/2007   HARDWARE REMOVAL Right 06/22/2023   Procedure: HARDWARE REMOVAL;  Surgeon: Hardy Lia, MD;  Location: [facility] OR;  Service: Orthopedics;  Laterality: Right;   HIATAL HERNIA REPAIR  09/25/2014   at [facility]   KNEE ARTHROSCOPY Right 06/22/2023   Procedure: ARTHROSCOPY KNEE;  Surgeon: Hardy Lia, MD;  Location: [facility] OR;  Service: Orthopedics;  Laterality: Right;   ORIF FEMUR FRACTURE Right 08/30/2022   Procedure: OPEN REDUCTION INTERNAL FIXATION (ORIF) DISTAL FEMUR FRACTURE;  Surgeon: Hardy Lia, MD;  Location: [facility] OR;  Service: Orthopedics;  Laterality: Right;   POLYPECTOMY  10/22/2020   Procedure: POLYPECTOMY;  Surgeon: Selena Daily, MD;  Location: [facility] SURGERY [facility];  Service: Endoscopy;;   POLYPECTOMY  06/16/2022   Procedure: POLYPECTOMY;  Surgeon: Selena Daily, MD;  Location: [facility] SURGERY [facility];  Service: Endoscopy;;   ROTATOR CUFF REPAIR Right    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   TOTAL KNEE ARTHROPLASTY Right 09/19/2023   Procedure: RIGHT TOTAL KNEE ARTHROPLASTY;  Surgeon: Arnie Lao, MD;  Location: [facility] OR;  Service: Orthopedics;  Laterality: Right;   Patient Active Problem List   Diagnosis Date Noted   Ventral hernia without  obstruction or gangrene 01/05/2024   Secondary hyperparathyroidism, non-renal (HCC) 10/25/2023   Age-related osteoporosis without current pathological fracture 10/23/2023   S/P total knee arthroplasty, right 09/19/2023   Status post total right knee replacement 09/19/2023   Chronic pain syndrome 07/12/2023   Anxiety 09/01/2022   Vitamin D  deficiency 09/01/2022   Metabolic bone disease  09/01/2022   Displaced supracondylar fracture of distal end of right femur without intracondylar extension (HCC) 08/29/2022   Mood disorder (HCC) 07/08/2022   Disability of walking 06/01/2022   Degeneration of lumbar intervertebral disc 06/01/2022   Iron  deficiency anemia 05/10/2022   Lumbar spondylolysis 02/25/2022   Sleep disturbance 07/02/2021   Preventative health care 06/23/2018   Personal hx of gastric bypass 06/23/2018   Erectile dysfunction 06/23/2018   Hx of adenomatous colonic polyps    Essential hypertension 10/17/2013   Diverticulosis large intestine w/o perforation or abscess w/bleeding    Colon polyps     PCP: Dr. Curt Dover  REFERRING PROVIDER: Dr. Norberto Bear  REFERRING DIAG: [phone] (ICD-10-CM) - Status post total right knee replacement   THERAPY DIAG:  Muscle weakness (generalized)  Limited joint range of motion (ROM)  Difficulty in walking, not  elsewhere classified  Abnormality of gait and mobility  Acute pain of right knee  Stiffness of right knee, not elsewhere classified  Rationale for Evaluation and Treatment: Rehabilitation  ONSET DATE: 09/19/2023  SUBJECTIVE:   SUBJECTIVE STATEMENT:  Patient reports mild increase in R knee pain over night limiting sleep. States that "clicking" has returned in knee.     PERTINENT HISTORY:  Per ortho note from Dr. Lucienne Ryder: Emmaline Haring, 58 y.o. male, has a history of pain and functional disability in the right knee due to trauma and has failed non-surgical conservative treatments for greater than 12 weeks  to include NSAID's and/or analgesics, corticosteriod injections, use of assistive devices, and activity modification.  Onset of symptoms was abrupt, starting 1 years ago with gradually worsening course since that time. The patient noted prior procedures on the knee to include  ORIF  on the right knee(s).  Per previous admission chart review from Felicity Horseman, PT, DPT: Relevant past medical history and comorbidities include s/p hardware removal and arthroscopy of R knee on 06/22/2023 (hardware originally for distal medial condyle fracture);  s/p L5-S1 ANTERIOR LUMBAR FUSION on 03/06/2023; Diverticulosis large intestine w/o perforation or abscess w/bleeding; Essential hypertension; Rectal bleeding; Hx of adenomatous colonic polyps; Personal hx of gastric bypass; Erectile dysfunction; Sleep disturbance; Lumbar spondylolysis; Microcytic anemia; Iron  deficiency anemia; Numbness and tingling; Imbalance; Difficulty sleeping; Disability of walking; Pain in joint involving ankle and foot; Degeneration of lumbar intervertebral disc; Erosive esophagitis; Mood disorder (HCC); Displaced supracondylar fracture of distal end of right femur without intracondylar extension (HCC); Anxiety; Vitamin D  deficiency; Metabolic bone disease; and Radiculopathy, lumbar region. Please see above for relevant surgery history; osteoporosis, bowel/urinary incontinence, stumbling and dropping things. Patient denies hx of cancer, stroke, seizures, lung problems, heart problems, diabetes, and unexplained weight loss.   PAIN:  Are you having pain? Yes 5/10 knee. Sore. Clicking.   PRECAUTIONS: Knee  RED FLAGS: Bowel or bladder incontinence: Yes: bowel     WEIGHT BEARING RESTRICTIONS: No  FALLS:  Has patient fallen in last 6 months? 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  NEXT MD VISIT: Next month- Dr. Lucienne Ryder  OBJECTIVE:  Note: Objective measures were completed at Evaluation unless otherwise noted.  DIAGNOSTIC FINDINGS:  Narrative & Impression  CLINICAL DATA:  Status post right knee replacement.   EXAM: PORTABLE RIGHT KNEE - 1-2 VIEW   COMPARISON:  06/22/2023   FINDINGS: Right knee arthroplasty in expected alignment. No periprosthetic lucency or fracture. There has been patellar resurfacing. Recent postsurgical change includes air and edema in the soft tissues and joint space. Chronic ghost tracks in the distal femur. Anterior skin staples in place.   IMPRESSION: Right knee arthroplasty without immediate postoperative complication.     Electronically Signed   By: Chadwick Colonel M.D.   On: 09/19/2023 16:43    PATIENT SURVEYS:  FOTO 39 with goal of 54  COGNITION: Overall cognitive status: Within functional limits for tasks assessed     SENSATION: Light touch: Impaired - reported dead zone lateral joint line  EDEMA:  Circumferential:  1)bottom of incision=38; 2)10 cm above distal incision-=48, 3) top of incision= 49  POSTURE: No Significant postural limitations  PALPATION: Global tenderness along right knee- joint line and posterior knee  LOWER EXTREMITY ROM:  Active ROM Right eval Left eval  Hip flexion    Hip extension    Hip abduction    Hip adduction    Hip internal rotation    Hip external rotation    Knee flexion 90deg   Knee extension Lack 12 deg   Ankle dorsiflexion    Ankle plantarflexion    Ankle inversion    Ankle eversion     (Blank rows = not tested)    TODAY'S TREATMENT: DATE: 01/10/24   octance- L1-L4  x 5 minutes - Able to progressing add more resistance without any pain- continuously monitoring  for pain and exertion level   Standing knee flex stretch (foot on 18 in step in // bars) - hold 10 sec x 15 reps.  Sitting HS stretch 15 sec hold x 5  PT instructed pt in goal assessment for progress note. See below for details.        PATIENT EDUCATION:  Education details: Purpose of PT, HEP, Pain management strategies including Ice, ROM, taking meds as prescribed. Pt educated throughout session about proper posture and technique with exercises. Improved exercise technique, movement at target joints, use of target muscles after min to mod verbal, visual, tactile cues.   Person educated: Patient Education method: Explanation, Demonstration, Tactile cues, Verbal cues, and Handouts Education comprehension: verbalized understanding, returned demonstration, verbal cues required, tactile cues required, and needs further education  HOME EXERCISE PROGRAM: Access Code: [email or web address] URL: [email or web address] Date: 10/25/2023 Prepared by: Ferrell Hu  Exercises - Standing Hip Flexion March  - 1 x daily - 7 x weekly - 3 sets - 10 reps - Standing Hip Abduction with Counter Support  - 1 x daily - 7 x weekly - 3 sets - 10 reps - Standing Hip Extension with Counter Support  - 1 x daily - 7 x weekly - 3 sets - 10 reps - Standing Knee Flexion with Unilateral Counter Support  - 1 x daily - 3 sets - 10 reps - Mini Squat with Counter Support  - 1 x daily - 3 sets - 10 reps - Heel Toe Raises with Unilateral Counter Support  - 1 x daily - 1 x weekly - 3 sets - 10 reps - Toe Raises with Counter Support  - 1 x daily - 1 x weekly - 3 sets - 10 reps  Access Code: [email or web address] URL: [email or web address] Date: 10/11/2023 Prepared by: Ferrell Hu  Exercises - Seated Hamstring Stretch  - 2 x daily - 3-5 sets - 30-45 hold - Seated Knee Flexion Stretch  - 2 x daily - 3-5 sets - 30-45 hold  ASSESSMENT:  CLINICAL IMPRESSION:  Pt performed AAROM on octane and self  stretch for flexion and extension. Mild back pain following HS stretch. Noted improvement in sit<>stand, ROM, and gait speed, but full functional return has not been reached at this point.   Patient will continue to benefit from skilled therapy to address remaining deficits in order to improve quality of life and return to PLOF.     OBJECTIVE IMPAIRMENTS: Abnormal gait, decreased activity tolerance, decreased balance, decreased endurance, decreased mobility, difficulty walking, decreased ROM, decreased strength, hypomobility, impaired flexibility, and pain.   ACTIVITY LIMITATIONS: carrying, lifting, bending, sitting, standing, squatting, sleeping, stairs, transfers, bed mobility, and toileting  PARTICIPATION LIMITATIONS: meal prep, cleaning, laundry, driving, shopping, community activity, occupation, and yard work  PERSONAL  FACTORS: 1-2 comorbidities: chronic pain, HTN  are also affecting patient's functional outcome.   REHAB POTENTIAL: Good  CLINICAL DECISION MAKING: Stable/uncomplicated  EVALUATION COMPLEXITY: Low   GOALS: Goals reviewed with patient? Yes  SHORT TERM GOALS: Target date: 11/08/2023 Pt will be independent with HEP in order to decrease ankle pain and increase strength in order to improve pain-free function at home and work.  Baseline: EVAL- No formal HEP in place; 12/10: independent  Goal status: ACHIEVED    LONG TERM GOALS: Target date: 12/06/2023  Pt will decrease worst pain as reported on NPRS by at least 3 points in order to demonstrate clinically significant reduction in ankle/foot pain.  Baseline: EVAL- R knee pain = 7-8/10; 12/05/23: 6-7/10 worst pain  12/07/23: 6-7 over past 7 days 1/15: 5/10 at worst 4/10 at best  Goal status: ONGOING  2.  Pt will improve FOTO to target score of 54 to display perceived improvements in ability to complete ADL's.  Baseline: EVAL= 39;  12/05/23: 58 12/07/23: 49 1/15: 35 Goal status: on going  3.  Patient will improve  Right knee AROM extension to <5 deg from zero for improved ability to straighten his leg with all mobility.  Baseline: EVAL= 12 deg; 12/10: ~10 degrees but still quite edematous 12/07/23: 3 deg lacking  1/15: grossly WFL active. Lacking ~ 2-3 full extension at rest.  Goal status: ONGOING  4.  Patient will improve Right knee AROM Flexion to > 120 deg for optimal right knee mobility with steps and sitting.  Baseline: EVAL= 90 deg; 12/05/23:  12/07/23: 109 deg 1/15: 117 active. 121 PROM Goal status: ONGOING  5.  Patient (> 23 years old) will complete five times sit to stand test in < 15 seconds indicating an increased LE strength and improved balance. Baseline: 11/14: 31.64 sec no UE support  12/07/23: 22.91 sec with no UE 1/15: 19.97 sec UE on knees.  Goal status: ONGOING   6.  Patient will reduce timed up and go to <11 seconds to reduce fall risk and demonstrate improved transfer/gait ability. Baseline: 11/14 18.96 sec with SPC 12/07/23: 16.52 sec without AD; 12.76 sec with SPC 1/15: 11.3 with SPC average of 3 trials  Goal status: on going.   7.  Patient will increase 10 meter walk test to >1.4m/s as to improve gait speed for better community ambulation and to reduce fall risk. Baseline: 11:14 0.58m/s 12/07/23: 0.96 m/s 1/15: 0.47m/s Goal status: ONGOING    PLAN:  PT FREQUENCY: 1-2x/week  PT DURATION: 8 weeks  PLANNED INTERVENTIONS: 97164- PT Re-evaluation, 97110-Therapeutic exercises, 97530- Therapeutic activity, 97112- Neuromuscular re-education, 97535- Self Care, 78295- Manual therapy, [phone]- Gait training, [phone]- Orthotic Fit/training, [phone]- Electrical stimulation (manual), [phone]- Ultrasound, Patient/Family education, Balance training, Stair training, Taping, Dry Needling, Joint mobilization, Manual lymph drainage, Scar mobilization, Compression bandaging, Cryotherapy, and Moist heat  PLAN FOR NEXT SESSION:   Manual therapy for ROM, TE for strengthening and knee  stability, and continue with dynamic balance activities.     Aurora Lees PT, DPT  Physical Therapist - [place]  [facility] ([address])  5:25 PM 01/10/24

## 2024-01-12 ENCOUNTER — Ambulatory Visit: Payer: Medicaid Other

## 2024-01-12 DIAGNOSIS — Z7689 Persons encountering health services in other specified circumstances: Secondary | ICD-10-CM | POA: Diagnosis not present

## 2024-01-15 NOTE — Therapy (Signed)
OUTPATIENT PHYSICAL THERAPY TREATMENT    Patient Name: Harold Smith MRN: [id number] DOB:1966/12/15, 58 y.o., male Today's Date: 01/16/2024  END OF SESSION:  PT End of Session - 01/16/24 0804     Visit Number 21    Number of Visits 26    Date for PT Re-Evaluation 01/31/24    Authorization Type [employer] 2024    Authorization - Number of Visits 30    Progress Note Due on Visit 20    PT Start Time 0800    PT Stop Time 0843    PT Time Calculation (min) 43 min    Equipment Utilized During Treatment Gait belt    Activity Tolerance Patient tolerated treatment well;No increased pain    Behavior During Therapy WFL for tasks assessed/performed                 Past Medical History:  Diagnosis Date   Allergic rhinitis due to pollen    Anemia    Anxiety    Chronic pain    Colon polyps    Depression    Diverticulitis large intestine    Erosive esophagitis    GERD (gastroesophageal reflux disease)    HTN (hypertension)    Irritable bowel syndrome    Metabolic bone disease 09/01/2022   Obesity    Osteoarthritis, knee    Sleep apnea    does not use cpap, pt states he no longer needed CPAP due to weight loss   Sleep disturbance    Vitamin D deficiency 09/01/2022   Past Surgical History:  Procedure Laterality Date   ABDOMINAL EXPOSURE N/A 03/06/2023   Procedure: ABDOMINAL EXPOSURE;  Surgeon: Cephus Shelling, MD;  Location: [facility] OR;  Service: Vascular;  Laterality: N/A;   ACHILLES TENDON REPAIR Right 12/26/2006   ANTERIOR LUMBAR FUSION N/A 03/06/2023   Procedure: L5-S1 ANTERIOR LUMBAR FUSION 1 LEVEL;  Surgeon: London Sheer, MD;  Location: [facility] OR;  Service: Orthopedics;  Laterality: N/A;   BIOPSY  08/29/2023   Procedure: BIOPSY;  Surgeon: Toney Reil, MD;  Location: [facility] ENDOSCOPY;  Service: Gastroenterology;;   CATARACT EXTRACTION W/PHACO Left 07/20/2021   Procedure: CATARACT EXTRACTION PHACO AND INTRAOCULAR LENS PLACEMENT (IOC) LEFT 2.11  00:24.0;  Surgeon: Galen Manila, MD;  Location: [facility];  Service: Ophthalmology;  Laterality: Left;  sleep apnea   CATARACT EXTRACTION W/PHACO Right 08/03/2021   Procedure: CATARACT EXTRACTION PHACO AND INTRAOCULAR LENS PLACEMENT (IOC) RIGHT;  Surgeon: Galen Manila, MD;  Location: [facility];  Service: Ophthalmology;  Laterality: Right;  3.19 0:33.0   CHOLECYSTECTOMY     COLON RESECTION Left 12/26/2004   due to diverticular disease at [facility]   COLONOSCOPY  2013?   COLONOSCOPY N/A 11/30/2022   Procedure: COLONOSCOPY;  Surgeon: Toney Reil, MD;  Location: [facility] ENDOSCOPY;  Service: Gastroenterology;  Laterality: N/A;   COLONOSCOPY WITH PROPOFOL N/A 05/22/2018   Procedure: COLONOSCOPY WITH PROPOFOL;  Surgeon: Toney Reil, MD;  Location: [facility] ENDOSCOPY;  Service: Gastroenterology;  Laterality: N/A;   COLONOSCOPY WITH PROPOFOL N/A 10/22/2020   Procedure: COLONOSCOPY WITH PROPOFOL;  Surgeon: Toney Reil, MD;  Location: [facility];  Service: Endoscopy;  Laterality: N/A;  priority 4   COLONOSCOPY WITH PROPOFOL N/A 11/10/2021   Procedure: COLONOSCOPY WITH PROPOFOL;  Surgeon: Toney Reil, MD;  Location: [facility] ENDOSCOPY;  Service: Gastroenterology;  Laterality: N/A;   COLONOSCOPY WITH PROPOFOL N/A 06/16/2022   Procedure: COLONOSCOPY WITH PROPOFOL;  Surgeon: Toney Reil, MD;  Location:  [facility];  Service: Endoscopy;  Laterality: N/A;   COLONOSCOPY WITH PROPOFOL N/A 11/29/2022   Procedure: COLONOSCOPY WITH PROPOFOL;  Surgeon: Toney Reil, MD;  Location: [facility] ENDOSCOPY;  Service: Gastroenterology;  Laterality: N/A;   DRUG INDUCED ENDOSCOPY N/A 06/10/2019   Procedure: DRUG INDUCED SLEEP ENDOSCOPY;  Surgeon: Osborn Coho, MD;  Location: [facility];  Service: ENT;  Laterality: N/A;   ESOPHAGOGASTRODUODENOSCOPY (EGD) WITH PROPOFOL N/A 06/16/2022   Procedure: ESOPHAGOGASTRODUODENOSCOPY (EGD) WITH  PROPOFOL;  Surgeon: Toney Reil, MD;  Location: [facility];  Service: Endoscopy;  Laterality: N/A;   ESOPHAGOGASTRODUODENOSCOPY (EGD) WITH PROPOFOL N/A 05/24/2023   Procedure: ESOPHAGOGASTRODUODENOSCOPY (EGD) WITH PROPOFOL;  Surgeon: Toney Reil, MD;  Location: [facility] ENDOSCOPY;  Service: Gastroenterology;  Laterality: N/A;   ESOPHAGOGASTRODUODENOSCOPY (EGD) WITH PROPOFOL N/A 08/29/2023   Procedure: ESOPHAGOGASTRODUODENOSCOPY (EGD) WITH PROPOFOL;  Surgeon: Toney Reil, MD;  Location: [facility] ENDOSCOPY;  Service: Gastroenterology;  Laterality: N/A;   GASTRIC BYPASS N/A 12/27/2007   HARDWARE REMOVAL Right 06/22/2023   Procedure: HARDWARE REMOVAL;  Surgeon: Myrene Galas, MD;  Location: [facility] OR;  Service: Orthopedics;  Laterality: Right;   HIATAL HERNIA REPAIR  09/25/2014   at [facility]   KNEE ARTHROSCOPY Right 06/22/2023   Procedure: ARTHROSCOPY KNEE;  Surgeon: Myrene Galas, MD;  Location: [facility] OR;  Service: Orthopedics;  Laterality: Right;   ORIF FEMUR FRACTURE Right 08/30/2022   Procedure: OPEN REDUCTION INTERNAL FIXATION (ORIF) DISTAL FEMUR FRACTURE;  Surgeon: Myrene Galas, MD;  Location: [facility] OR;  Service: Orthopedics;  Laterality: Right;   POLYPECTOMY  10/22/2020   Procedure: POLYPECTOMY;  Surgeon: Toney Reil, MD;  Location: [facility];  Service: Endoscopy;;   POLYPECTOMY  06/16/2022   Procedure: POLYPECTOMY;  Surgeon: Toney Reil, MD;  Location: [facility];  Service: Endoscopy;;   ROTATOR CUFF REPAIR Right    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   TOTAL KNEE ARTHROPLASTY Right 09/19/2023   Procedure: RIGHT TOTAL KNEE ARTHROPLASTY;  Surgeon: Kathryne Hitch, MD;  Location: [facility] OR;  Service: Orthopedics;  Laterality: Right;   Patient Active Problem List   Diagnosis Date Noted   Ventral hernia without obstruction or gangrene 01/05/2024   Secondary hyperparathyroidism, non-renal (HCC) 10/25/2023   Age-related  osteoporosis without current pathological fracture 10/23/2023   S/P total knee arthroplasty, right 09/19/2023   Status post total right knee replacement 09/19/2023   Chronic pain syndrome 07/12/2023   Anxiety 09/01/2022   Vitamin D deficiency 09/01/2022   Metabolic bone disease  09/01/2022   Displaced supracondylar fracture of distal end of right femur without intracondylar extension (HCC) 08/29/2022   Mood disorder (HCC) 07/08/2022   Disability of walking 06/01/2022   Degeneration of lumbar intervertebral disc 06/01/2022   Iron deficiency anemia 05/10/2022   Lumbar spondylolysis 02/25/2022   Sleep disturbance 07/02/2021   Preventative health care 06/23/2018   Personal hx of gastric bypass 06/23/2018   Erectile dysfunction 06/23/2018   Hx of adenomatous colonic polyps    Essential hypertension 10/17/2013   Diverticulosis large intestine w/o perforation or abscess w/bleeding    Colon polyps     PCP: Dr. Tillman Abide  REFERRING PROVIDER: Dr. Doneen Poisson  REFERRING DIAG: [phone] (ICD-10-CM) - Status post total right knee replacement   THERAPY DIAG:  Muscle weakness (generalized)  Limited joint range of motion (ROM)  Difficulty in walking, not elsewhere classified  Abnormality of gait and mobility  Acute pain of right knee  Stiffness of right knee,  not elsewhere classified  Rationale for Evaluation and Treatment: Rehabilitation  ONSET DATE: 09/19/2023  SUBJECTIVE:   SUBJECTIVE STATEMENT:  Patient reports his back has really been bothering him more over the weekend. He is scheduled for surgery next week.      PERTINENT HISTORY:  Per ortho note from Dr. Magnus Ivan: Ilona Sorrel, 58 y.o. male, has a history of pain and functional disability in the right knee due to trauma and has failed non-surgical conservative treatments for greater than 12 weeks to include NSAID's and/or analgesics, corticosteriod injections, use of assistive devices, and activity  modification.  Onset of symptoms was abrupt, starting 1 years ago with gradually worsening course since that time. The patient noted prior procedures on the knee to include  ORIF  on the right knee(s).  Per previous admission chart review from Lesli Albee, PT, DPT: Relevant past medical history and comorbidities include s/p hardware removal and arthroscopy of R knee on 06/22/2023 (hardware originally for distal medial condyle fracture);  s/p L5-S1 ANTERIOR LUMBAR FUSION on 03/06/2023; Diverticulosis large intestine w/o perforation or abscess w/bleeding; Essential hypertension; Rectal bleeding; Hx of adenomatous colonic polyps; Personal hx of gastric bypass; Erectile dysfunction; Sleep disturbance; Lumbar spondylolysis; Microcytic anemia; Iron deficiency anemia; Numbness and tingling; Imbalance; Difficulty sleeping; Disability of walking; Pain in joint involving ankle and foot; Degeneration of lumbar intervertebral disc; Erosive esophagitis; Mood disorder (HCC); Displaced supracondylar fracture of distal end of right femur without intracondylar extension (HCC); Anxiety; Vitamin D deficiency; Metabolic bone disease; and Radiculopathy, lumbar region. Please see above for relevant surgery history; osteoporosis, bowel/urinary incontinence, stumbling and dropping things. Patient denies hx of cancer, stroke, seizures, lung problems, heart problems, diabetes, and unexplained weight loss.   PAIN:  Are you having pain? Yes 5/10 knee. Sore. Clicking.   PRECAUTIONS: Knee  RED FLAGS: Bowel or bladder incontinence: Yes: bowel     WEIGHT BEARING RESTRICTIONS: No  FALLS:  Has patient fallen in last 6 months? 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  NEXT MD VISIT: Next month- Dr. Magnus Ivan  OBJECTIVE:  Note: Objective measures were completed at Evaluation unless otherwise noted.  DIAGNOSTIC FINDINGS:  Narrative & Impression  CLINICAL DATA:  Status post right knee replacement.   EXAM: PORTABLE RIGHT KNEE - 1-2 VIEW   COMPARISON:  06/22/2023   FINDINGS: Right knee arthroplasty in expected alignment. No periprosthetic lucency or fracture. There has been patellar resurfacing. Recent postsurgical change includes air and edema in the soft tissues and joint space. Chronic ghost tracks in the distal femur. Anterior skin staples in place.   IMPRESSION: Right knee arthroplasty without immediate postoperative complication.     Electronically Signed   By: Narda Rutherford M.D.   On: 09/19/2023 16:43    PATIENT SURVEYS:  FOTO 39 with goal of 54  COGNITION: Overall cognitive status: Within functional limits for tasks assessed     SENSATION: Light touch: Impaired - reported dead zone lateral joint line  EDEMA:  Circumferential:  1)bottom of incision=38; 2)10 cm above distal incision-=48, 3) top of incision= 49    POSTURE: No Significant postural limitations  PALPATION: Global tenderness along right knee- joint line and posterior  knee  LOWER EXTREMITY ROM:  Active ROM Right eval Left eval  Hip flexion    Hip extension    Hip abduction    Hip adduction    Hip internal rotation    Hip external rotation    Knee flexion 90deg   Knee extension Lack 12 deg   Ankle dorsiflexion    Ankle plantarflexion    Ankle inversion    Ankle eversion     (Blank rows = not tested)    TODAY'S TREATMENT: DATE: 01/16/24     Nustep LE only- L1  x 6 min for ROM- continuously monitoring for pain and exertion level    Discussion of Home treatment:   Pain management- Including taking meds, ROM, Ice, elevation and changing  position.   ROM REVIEW- Discussed supine, prone, sitting, and patient then performed standing:    -Standing knee flex stretch (foot on 18 in step in // bars) - hold 10 sec x 15 reps.  -Standing HS stretch 15 sec hold x 5   LE Strengthening: Discussed supine- Ankle pumps (leg elevated), quad sets, heel slide, hip abd, bridging   Patient performed standing: Hip march, Hip abd, Hip ext, Ham curl, Minisquat, calf raises, Step up (FWD/Lateral)  x approx 20 reps each   BALANCE: Reviewed SLS; tandem walking in // bars without UE support. - No LOB today- with demo of each- No increased back pain  ROM: 123 (Active Right knee flexion); did not measure ext        PATIENT EDUCATION:  Education details: Purpose of PT, HEP, Pain management strategies including Ice, ROM, taking meds as prescribed. Pt educated throughout session about proper posture and technique with exercises. Improved exercise technique, movement at target joints, use of target muscles after min to mod verbal, visual, tactile cues.   Person educated: Patient Education method: Explanation, Demonstration, Tactile cues, Verbal cues, and Handouts Education comprehension: verbalized understanding, returned demonstration, verbal cues required, tactile cues required, and needs further education  HOME EXERCISE PROGRAM: Access Code: [email or web address] URL: [email or web address] Date: 10/25/2023 Prepared by: Maureen Ralphs  Exercises - Standing Hip Flexion March  - 1 x daily - 7 x weekly - 3 sets - 10 reps - Standing Hip Abduction with Counter Support  - 1 x daily - 7 x weekly - 3 sets - 10 reps - Standing Hip Extension with Counter Support  - 1 x daily - 7 x weekly - 3 sets - 10 reps - Standing Knee Flexion with Unilateral Counter Support  - 1 x daily - 3 sets - 10 reps - Mini Squat with Counter Support  - 1 x daily - 3 sets - 10 reps - Heel Toe Raises with Unilateral Counter Support  - 1 x daily - 1 x weekly - 3 sets - 10  reps - Toe Raises with Counter Support  - 1 x daily - 1 x weekly - 3 sets - 10 reps  Access Code: [facility] URL: [email or web address] Date: 10/11/2023 Prepared by: Maureen Ralphs  Exercises - Seated Hamstring Stretch  - 2 x daily - 3-5 sets - 30-45 hold - Seated Knee Flexion Stretch  - 2 x daily - 3-5 sets - 30-45 hold  ASSESSMENT:  CLINICAL IMPRESSION:  Patient presents with painful Low back -closely observed today with session designated to review HEP for pending upcoming discharge. Spent time reviewing all aspects of rehab from healing/pain management to ROM/LE strengthening/balance and mobility. He was responsive and presents with good understanding of all therex. Plan  next session for potential discharge as patient has pending lumbar surgery at end of moth.  Patient will continue to benefit from skilled therapy to address remaining deficits in order to improve quality of life and return to PLOF.     OBJECTIVE IMPAIRMENTS: Abnormal gait, decreased activity tolerance, decreased balance, decreased endurance, decreased mobility, difficulty walking, decreased ROM, decreased strength, hypomobility, impaired flexibility, and pain.   ACTIVITY LIMITATIONS: carrying, lifting, bending, sitting, standing, squatting, sleeping, stairs, transfers, bed mobility, and toileting  PARTICIPATION LIMITATIONS: meal prep, cleaning, laundry, driving, shopping, community activity, occupation, and yard work  PERSONAL FACTORS: 1-2 comorbidities: chronic pain, HTN  are also affecting patient's functional outcome.   REHAB POTENTIAL: Good  CLINICAL DECISION MAKING: Stable/uncomplicated  EVALUATION COMPLEXITY: Low   GOALS: Goals reviewed with patient? Yes  SHORT TERM GOALS: Target date: 11/08/2023 Pt will be independent with HEP in order to decrease ankle pain and increase strength in order to improve pain-free function at home and work.  Baseline: EVAL- No formal HEP in place; 12/10:  independent  Goal status: ACHIEVED    LONG TERM GOALS: Target date: 12/06/2023  Pt will decrease worst pain as reported on NPRS by at least 3 points in order to demonstrate clinically significant reduction in ankle/foot pain.  Baseline: EVAL- R knee pain = 7-8/10; 12/05/23: 6-7/10 worst pain  12/07/23: 6-7 over past 7 days 1/15: 5/10 at worst 4/10 at best  Goal status: ONGOING  2.  Pt will improve FOTO to target score of 54 to display perceived improvements in ability to complete ADL's.  Baseline: EVAL= 39;  12/05/23: 58 12/07/23: 49 1/15: 35 Goal status: on going  3.  Patient will improve Right knee AROM extension to <5 deg from zero for improved ability to straighten his leg with all mobility.  Baseline: EVAL= 12 deg; 12/10: ~10 degrees but still quite edematous 12/07/23: 3 deg lacking  1/15: grossly WFL active. Lacking ~ 2-3 full extension at rest.  Goal status: ONGOING  4.  Patient will improve Right knee AROM Flexion to > 120 deg for optimal right knee mobility with steps and sitting.  Baseline: EVAL= 90 deg; 12/05/23:  12/07/23: 109 deg 1/15: 117 active. 121 PROM Goal status: ONGOING  5.  Patient (> 28 years old) will complete five times sit to stand test in < 15 seconds indicating an increased LE strength and improved balance. Baseline: 11/14: 31.64 sec no UE support  12/07/23: 22.91 sec with no UE 1/15: 19.97 sec UE on knees.  Goal status: ONGOING   6.  Patient will reduce timed up and go to <11 seconds to reduce fall risk and demonstrate improved transfer/gait ability. Baseline: 11/14 18.96 sec with SPC 12/07/23: 16.52 sec without AD; 12.76 sec with SPC 1/15: 11.3 with SPC average of 3 trials  Goal status: on going.   7.  Patient will increase 10 meter walk test to >1.1m/s as to improve gait speed for better community ambulation and to reduce fall risk. Baseline: 11:14 0.54m/s 12/07/23: 0.96 m/s 1/15: 0.51m/s Goal status: ONGOING    PLAN:  PT FREQUENCY:  1-2x/week  PT DURATION: 8 weeks  PLANNED INTERVENTIONS: 97164- PT Re-evaluation, 97110-Therapeutic exercises, 97530- Therapeutic activity, 97112- Neuromuscular re-education, 97535- Self Care, 96045- Manual therapy, [phone]- Gait training, [phone]- Orthotic Fit/training, [phone]- Electrical stimulation (manual), [phone]- Ultrasound, Patient/Family education, Balance training, Stair training, Taping, Dry Needling, Joint mobilization, Manual lymph drainage, Scar mobilization, Compression bandaging, Cryotherapy, and Moist heat  PLAN FOR NEXT SESSION:   Manual therapy for  ROM, TE for strengthening and knee stability, and continue with dynamic balance activities.     Louis Meckel, PT Physical Therapist - [facility]  1:39 PM 01/16/24

## 2024-01-16 ENCOUNTER — Encounter: Payer: Self-pay | Admitting: Oncology

## 2024-01-16 ENCOUNTER — Ambulatory Visit: Payer: Medicaid Other

## 2024-01-16 DIAGNOSIS — M25661 Stiffness of right knee, not elsewhere classified: Secondary | ICD-10-CM

## 2024-01-16 DIAGNOSIS — M6281 Muscle weakness (generalized): Secondary | ICD-10-CM

## 2024-01-16 DIAGNOSIS — M2560 Stiffness of unspecified joint, not elsewhere classified: Secondary | ICD-10-CM

## 2024-01-16 DIAGNOSIS — Z7689 Persons encountering health services in other specified circumstances: Secondary | ICD-10-CM | POA: Diagnosis not present

## 2024-01-16 DIAGNOSIS — R269 Unspecified abnormalities of gait and mobility: Secondary | ICD-10-CM

## 2024-01-16 DIAGNOSIS — R262 Difficulty in walking, not elsewhere classified: Secondary | ICD-10-CM

## 2024-01-16 DIAGNOSIS — M25561 Pain in right knee: Secondary | ICD-10-CM

## 2024-01-16 NOTE — Progress Notes (Signed)
Surgical Instructions   Your procedure is scheduled on Tuesday, January 28th, 2025. Report to [facility] Main Entrance "A" at 5:30 A.M., then check in with the Admitting office. Any questions or running late day of surgery: call [phone]  Questions prior to your surgery date: call [phone], Monday-Friday, 8am-4pm. If you experience any cold or flu symptoms such as cough, fever, chills, shortness of breath, etc. between now and your scheduled surgery, please notify us at the above number.     Remember:  Do not eat after midnight the night before your surgery  You may drink clear liquids until 4:30 the morning of your surgery.   Clear liquids allowed are: Water, Non-Citrus Juices (without pulp), Carbonated Beverages, Clear Tea (no milk, honey, etc.), Black Coffee Only (NO MILK, CREAM OR POWDERED CREAMER of any kind), and Gatorade.  Patient Instructions  The night before surgery:  No food after midnight. ONLY clear liquids after midnight  The day of surgery (if you do NOT have diabetes):  Drink ONE (1) Pre-Surgery Clear Ensure by 4:30 the morning of surgery. Drink in one sitting. Do not sip.  This drink was given to you during your hospital  pre-op appointment visit.  Nothing else to drink after completing the  Pre-Surgery Clear Ensure.         If you have questions, please contact your surgeon's office.     Take these medicines the morning of surgery with A SIP OF WATER: Cetirizine (Zyrtec) Duloxetine (Cymbalta) Gabapentin (Neurontin) Omeprazole (Prilosec)   May take these medicines IF NEEDED: Baclofen (Lioresal) Dicyclomine (Bentyl) Fluticasone (Flonase) Hydromorphone (Dilaudid)    One week prior to surgery, STOP taking any Aspirin (unless otherwise instructed by your surgeon) Aleve, Naproxen, Ibuprofen, Motrin, Advil, Goody's, BC's, all herbal medications, fish oil, and non-prescription vitamins.  This includes Meloxicam (Mobic).                     Do NOT  Smoke (Tobacco/Vaping) for 24 hours prior to your procedure.  If you use a CPAP at night, you may bring your mask/headgear for your overnight stay.   You will be asked to remove any contacts, glasses, piercing's, hearing aid's, dentures/partials prior to surgery. Please bring cases for these items if needed.    Patients discharged the day of surgery will not be allowed to drive home, and someone needs to stay with them for 24 hours.  SURGICAL WAITING ROOM VISITATION Patients may have no more than 2 support people in the waiting area - these visitors may rotate.   Pre-op nurse will coordinate an appropriate time for 1 ADULT support person, who may not rotate, to accompany patient in pre-op.  Children under the age of 62 must have an adult with them who is not the patient and must remain in the main waiting area with an adult.  If the patient needs to stay at the hospital during part of their recovery, the visitor guidelines for inpatient rooms apply.  Please refer to the [facility] website for the visitor guidelines for any additional information.   If you received a COVID test during your pre-op visit  it is requested that you wear a mask when out in public, stay away from anyone that may not be feeling well and notify your surgeon if you develop symptoms. If you have been in contact with anyone that has tested positive in the last 10 days please notify you surgeon.      Pre-operative 5 CHG Bathing  Instructions   You can play a key role in reducing the risk of infection after surgery. Your skin needs to be as free of germs as possible. You can reduce the number of germs on your skin by washing with CHG (chlorhexidine gluconate) soap before surgery. CHG is an antiseptic soap that kills germs and continues to kill germs even after washing.   DO NOT use if you have an allergy to chlorhexidine/CHG or antibacterial soaps. If your skin becomes reddened or irritated, stop using the CHG and  notify one of our RNs at [phone].   Please shower with the CHG soap starting 4 days before surgery using the following schedule:     Please keep in mind the following:  DO NOT shave, including legs and underarms, starting the day of your first shower.   You may shave your face at any point before/day of surgery.  Place clean sheets on your bed the day you start using CHG soap. Use a clean washcloth (not used since being washed) for each shower. DO NOT sleep with pets once you start using the CHG.   CHG Shower Instructions:  Wash your face and private area with normal soap. If you choose to wash your hair, wash first with your normal shampoo.  After you use shampoo/soap, rinse your hair and body thoroughly to remove shampoo/soap residue.  Turn the water OFF and apply about 3 tablespoons (45 ml) of CHG soap to a CLEAN washcloth.  Apply CHG soap ONLY FROM YOUR NECK DOWN TO YOUR TOES (washing for 3-5 minutes)  DO NOT use CHG soap on face, private areas, open wounds, or sores.  Pay special attention to the area where your surgery is being performed.  If you are having back surgery, having someone wash your back for you may be helpful. Wait 2 minutes after CHG soap is applied, then you may rinse off the CHG soap.  Pat dry with a clean towel  Put on clean clothes/pajamas   If you choose to wear lotion, please use ONLY the CHG-compatible lotions that are listed below.  Additional instructions for the day of surgery: DO NOT APPLY any lotions, deodorants, cologne, or perfumes.   Do not bring valuables to the hospital. [facility] is not responsible for any belongings/valuables. Do not wear nail polish, gel polish, artificial nails, or any other type of covering on natural nails (fingers and toes) Do not wear jewelry or makeup Put on clean/comfortable clothes.  Please brush your teeth.  Ask your nurse before applying any prescription medications to the skin.     CHG Compatible  Lotions   Aveeno Moisturizing lotion  Cetaphil Moisturizing Cream  Cetaphil Moisturizing Lotion  Clairol Herbal Essence Moisturizing Lotion, Dry Skin  Clairol Herbal Essence Moisturizing Lotion, Extra Dry Skin  Clairol Herbal Essence Moisturizing Lotion, Normal Skin  Curel Age Defying Therapeutic Moisturizing Lotion with Alpha Hydroxy  Curel Extreme Care Body Lotion  Curel Soothing Hands Moisturizing Hand Lotion  Curel Therapeutic Moisturizing Cream, Fragrance-Free  Curel Therapeutic Moisturizing Lotion, Fragrance-Free  Curel Therapeutic Moisturizing Lotion, Original Formula  Eucerin Daily Replenishing Lotion  Eucerin Dry Skin Therapy Plus Alpha Hydroxy Crme  Eucerin Dry Skin Therapy Plus Alpha Hydroxy Lotion  Eucerin Original Crme  Eucerin Original Lotion  Eucerin Plus Crme Eucerin Plus Lotion  Eucerin TriLipid Replenishing Lotion  Keri Anti-Bacterial Hand Lotion  Keri Deep Conditioning Original Lotion Dry Skin Formula Softly Scented  Keri Deep Conditioning Original Lotion, Fragrance Free Sensitive Skin Formula  Keri  Lotion Fast Absorbing Fragrance Free Sensitive Skin Formula  Keri Lotion Fast Absorbing Softly Scented Dry Skin Formula  Keri Original Lotion  Keri Skin Renewal Lotion Keri Silky Smooth Lotion  Keri Silky Smooth Sensitive Skin Lotion  Nivea Body Creamy Conditioning Oil  Nivea Body Extra Enriched Lotion  Nivea Body Original Lotion  Nivea Body Sheer Moisturizing Lotion Nivea Crme  Nivea Skin Firming Lotion  NutraDerm 30 Skin Lotion  NutraDerm Skin Lotion  NutraDerm Therapeutic Skin Cream  NutraDerm Therapeutic Skin Lotion  ProShield Protective Hand Cream  Provon moisturizing lotion  Please read over the following fact sheets that you were given.

## 2024-01-17 ENCOUNTER — Encounter (HOSPITAL_COMMUNITY): Payer: Self-pay

## 2024-01-17 ENCOUNTER — Other Ambulatory Visit: Payer: Self-pay

## 2024-01-17 ENCOUNTER — Encounter (HOSPITAL_COMMUNITY)
Admission: RE | Admit: 2024-01-17 | Discharge: 2024-01-17 | Disposition: A | Discharge status: Home / Self Care | Payer: Medicaid Other | Source: Ambulatory Visit | Attending: Orthopedic Surgery | Admitting: Orthopedic Surgery

## 2024-01-17 VITALS — BP 148/103 | HR 89 | Temp 98.0°F | Resp 17 | Ht 72.0 in | Wt 181.6 lb

## 2024-01-17 DIAGNOSIS — Z7689 Persons encountering health services in other specified circumstances: Secondary | ICD-10-CM | POA: Diagnosis not present

## 2024-01-17 DIAGNOSIS — Z01812 Encounter for preprocedural laboratory examination: Secondary | ICD-10-CM | POA: Insufficient documentation

## 2024-01-17 DIAGNOSIS — Z01818 Encounter for other preprocedural examination: Secondary | ICD-10-CM

## 2024-01-17 LAB — BASIC METABOLIC PANEL
Anion gap: 8 (ref 5–15)
BUN: 13 mg/dL (ref 6–20)
CO2: 24 mmol/L (ref 22–32)
Calcium: 9.2 mg/dL (ref 8.9–10.3)
Chloride: 104 mmol/L (ref 98–111)
Creatinine, Ser: 0.88 mg/dL (ref 0.61–1.24)
GFR, Estimated: >60 mL/min (ref >60)
Glucose, Bld: 105 mg/dL — ABNORMAL HIGH (ref 70–99)
Potassium: 4.3 mmol/L (ref 3.5–5.1)
Sodium: 136 mmol/L (ref 135–145)

## 2024-01-17 LAB — CBC
HCT: 42.8 % (ref 39.0–52.0)
Hemoglobin: 14.2 g/dL (ref 13.0–17.0)
MCH: 29.7 pg (ref 26.0–34.0)
MCHC: 33.2 g/dL (ref 30.0–36.0)
MCV: 89.5 fL (ref 80.0–100.0)
Platelets: 365 10*3/uL (ref 150–400)
RBC: 4.78 MIL/uL (ref 4.22–5.81)
RDW: 16 % — ABNORMAL HIGH (ref 11.5–15.5)
WBC: 7.7 10*3/uL (ref 4.0–10.5)
nRBC: 0 % (ref 0.0–0.2)

## 2024-01-17 LAB — SURGICAL PCR SCREEN
MRSA, PCR: NEGATIVE
Staphylococcus aureus: NEGATIVE

## 2024-01-17 LAB — NO BLOOD PRODUCTS

## 2024-01-17 NOTE — Therapy (Signed)
OUTPATIENT PHYSICAL THERAPY TREATMENT/DISCHARGE VISIT    Patient Name: Harold Smith MRN: [id number] DOB:Jan 04, 1966, 58 y.o., male Today's Date: 01/18/2024  END OF SESSION:  PT End of Session - 01/18/24 0859     Visit Number 23    Number of Visits 26    Date for PT Re-Evaluation 01/31/24    Authorization Type [employer] 2024    Authorization - Number of Visits 30    Progress Note Due on Visit 30    PT Start Time 0801    PT Stop Time 0845    PT Time Calculation (min) 44 min    Equipment Utilized During Treatment Gait belt    Activity Tolerance Patient tolerated treatment well;No increased pain    Behavior During Therapy WFL for tasks assessed/performed                  Past Medical History:  Diagnosis Date   Allergic rhinitis due to pollen    Anemia    Anxiety    Chronic pain    Colon polyps    Depression    Diverticulitis large intestine    Erosive esophagitis    GERD (gastroesophageal reflux disease)    HTN (hypertension)    Irritable bowel syndrome    Metabolic bone disease 09/01/2022   Obesity    Osteoarthritis, knee    Sleep apnea    does not use cpap, pt states he no longer needed CPAP due to weight loss   Sleep disturbance    Vitamin D deficiency 09/01/2022   Past Surgical History:  Procedure Laterality Date   ABDOMINAL EXPOSURE N/A 03/06/2023   Procedure: ABDOMINAL EXPOSURE;  Surgeon: Cephus Shelling, MD;  Location: [facility] OR;  Service: Vascular;  Laterality: N/A;   ACHILLES TENDON REPAIR Right 12/26/2006   ANTERIOR LUMBAR FUSION N/A 03/06/2023   Procedure: L5-S1 ANTERIOR LUMBAR FUSION 1 LEVEL;  Surgeon: London Sheer, MD;  Location: [facility] OR;  Service: Orthopedics;  Laterality: N/A;   BIOPSY  08/29/2023   Procedure: BIOPSY;  Surgeon: Toney Reil, MD;  Location: [facility] ENDOSCOPY;  Service: Gastroenterology;;   CATARACT EXTRACTION W/PHACO Left 07/20/2021   Procedure: CATARACT EXTRACTION PHACO AND INTRAOCULAR LENS  PLACEMENT (IOC) LEFT 2.11 00:24.0;  Surgeon: Galen Manila, MD;  Location: [facility] SURGERY CNTR;  Service: Ophthalmology;  Laterality: Left;  sleep apnea   CATARACT EXTRACTION W/PHACO Right 08/03/2021   Procedure: CATARACT EXTRACTION PHACO AND INTRAOCULAR LENS PLACEMENT (IOC) RIGHT;  Surgeon: Galen Manila, MD;  Location: [facility]ERY [facility]TR;  Service: Ophthalmology;  Laterality: Right;  3.19 0:33.0   CHOLECYSTECTOMY     COLON RESECTION Left 12/26/2004   due to diverticular disease at [facility]   COLONOSCOPY  2013?   COLONOSCOPY N/A 11/30/2022   Procedure: COLONOSCOPY;  Surgeon: Toney Reil, MD;  Location: [facility] ENDOSCOPY;  Service: Gastroenterology;  Laterality: N/A;   COLONOSCOPY WITH PROPOFOL N/A 05/22/2018   Procedure: COLONOSCOPY WITH PROPOFOL;  Surgeon: Toney Reil, MD;  Location: [facility] ENDOSCOPY;  Service: Gastroenterology;  Laterality: N/A;   COLONOSCOPY WITH PROPOFOL N/A 10/22/2020   Procedure: COLONOSCOPY WITH PROPOFOL;  Surgeon: Toney Reil, MD;  Location: [facility] SURGERY CNTR;  Service: Endoscopy;  Laterality: N/A;  priority 4   COLONOSCOPY WITH PROPOFOL N/A 11/10/2021   Procedure: COLONOSCOPY WITH PROPOFOL;  Surgeon: Toney Reil, MD;  Location: [facility] ENDOSCOPY;  Service: Gastroenterology;  Laterality: N/A;   COLONOSCOPY WITH PROPOFOL N/A 06/16/2022   Procedure: COLONOSCOPY WITH PROPOFOL;  Surgeon: Toney Reil, MD;  Location: [facility];  Service: Endoscopy;  Laterality: N/A;   COLONOSCOPY WITH PROPOFOL N/A 11/29/2022   Procedure: COLONOSCOPY WITH PROPOFOL;  Surgeon: Toney Reil, MD;  Location: [facility] ENDOSCOPY;  Service: Gastroenterology;  Laterality: N/A;   DRUG INDUCED ENDOSCOPY N/A 06/10/2019   Procedure: DRUG INDUCED SLEEP ENDOSCOPY;  Surgeon: Osborn Coho, MD;  Location: [facility];  Service: ENT;  Laterality: N/A;   ESOPHAGOGASTRODUODENOSCOPY (EGD) WITH PROPOFOL N/A 06/16/2022   Procedure:  ESOPHAGOGASTRODUODENOSCOPY (EGD) WITH PROPOFOL;  Surgeon: Toney Reil, MD;  Location: [facility] SURGERY CNTR;  Service: Endoscopy;  Laterality: N/A;   ESOPHAGOGASTRODUODENOSCOPY (EGD) WITH PROPOFOL N/A 05/24/2023   Procedure: ESOPHAGOGASTRODUODENOSCOPY (EGD) WITH PROPOFOL;  Surgeon: Toney Reil, MD;  Location: [facility] ENDOSCOPY;  Service: Gastroenterology;  Laterality: N/A;   ESOPHAGOGASTRODUODENOSCOPY (EGD) WITH PROPOFOL N/A 08/29/2023   Procedure: ESOPHAGOGASTRODUODENOSCOPY (EGD) WITH PROPOFOL;  Surgeon: Toney Reil, MD;  Location: [facility] ENDOSCOPY;  Service: Gastroenterology;  Laterality: N/A;   GASTRIC BYPASS N/A 12/27/2007   HARDWARE REMOVAL Right 06/22/2023   Procedure: HARDWARE REMOVAL;  Surgeon: Myrene Galas, MD;  Location: [facility] OR;  Service: Orthopedics;  Laterality: Right;   HIATAL HERNIA REPAIR  09/25/2014   at [facility]   KNEE ARTHROSCOPY Right 06/22/2023   Procedure: ARTHROSCOPY KNEE;  Surgeon: Myrene Galas, MD;  Location: [facility] OR;  Service: Orthopedics;  Laterality: Right;   ORIF FEMUR FRACTURE Right 08/30/2022   Procedure: OPEN REDUCTION INTERNAL FIXATION (ORIF) DISTAL FEMUR FRACTURE;  Surgeon: Myrene Galas, MD;  Location: [facility] OR;  Service: Orthopedics;  Laterality: Right;   POLYPECTOMY  10/22/2020   Procedure: POLYPECTOMY;  Surgeon: Toney Reil, MD;  Location: [facility] SURGERY [facility];  Service: Endoscopy;;   POLYPECTOMY  06/16/2022   Procedure: POLYPECTOMY;  Surgeon: Toney Reil, MD;  Location: [facility] SURGERY CNTR;  Service: Endoscopy;;   ROTATOR CUFF REPAIR Right    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   TOTAL KNEE ARTHROPLASTY Right 09/19/2023   Procedure: RIGHT TOTAL KNEE ARTHROPLASTY;  Surgeon: Kathryne Hitch, MD;  Location: [facility] OR;  Service: Orthopedics;  Laterality: Right;   Patient Active Problem List   Diagnosis Date Noted   Ventral hernia without obstruction or gangrene 01/05/2024   Secondary hyperparathyroidism, non-renal  (HCC) 10/25/2023   Age-related osteoporosis without current pathological fracture 10/23/2023   S/P total knee arthroplasty, right 09/19/2023   Status post total right knee replacement 09/19/2023   Chronic pain syndrome 07/12/2023   Anxiety 09/01/2022   Vitamin D deficiency 09/01/2022   Metabolic bone disease  09/01/2022   Displaced supracondylar fracture of distal end of right femur without intracondylar extension (HCC) 08/29/2022   Mood disorder (HCC) 07/08/2022   Disability of walking 06/01/2022   Degeneration of lumbar intervertebral disc 06/01/2022   Iron deficiency anemia 05/10/2022   Lumbar spondylolysis 02/25/2022   Sleep disturbance 07/02/2021   Preventative health care 06/23/2018   Personal hx of gastric bypass 06/23/2018   Erectile dysfunction 06/23/2018   Hx of adenomatous colonic polyps    Essential hypertension 10/17/2013   Diverticulosis large intestine w/o perforation or abscess w/bleeding    Colon polyps     PCP: Dr. Tillman Abide  REFERRING PROVIDER: Dr. Doneen Poisson  REFERRING DIAG: [phone] (ICD-10-CM) - Status post total right knee replacement   THERAPY DIAG:  Muscle weakness (generalized)  Limited joint range of motion (ROM)  Difficulty in walking, not elsewhere classified  Abnormality of gait and mobility  Acute pain of right knee  Stiffness of right  knee, not elsewhere classified  Rationale for Evaluation and Treatment: Rehabilitation  ONSET DATE: 09/19/2023  SUBJECTIVE:   SUBJECTIVE STATEMENT:  Patient reports his BP has been up when he went for pre-op for his upcoming back surgery.  Otherwise reports some right knee pain- around 5/10 (more stiff due to cold weather) and ongoing LBP= 7/10.  PERTINENT HISTORY:  Per ortho note from Dr. Magnus Ivan: Ilona Sorrel, 58 y.o. male, has a history of pain and functional disability in the right knee due to trauma and has failed non-surgical conservative treatments for greater than 12 weeks  to include NSAID's and/or analgesics, corticosteriod injections, use of assistive devices, and activity modification.  Onset of symptoms was abrupt, starting 1 years ago with gradually worsening course since that time. The patient noted prior procedures on the knee to include  ORIF  on the right knee(s).  Per previous admission chart review from Lesli Albee, PT, DPT: Relevant past medical history and comorbidities include s/p hardware removal and arthroscopy of R knee on 06/22/2023 (hardware originally for distal medial condyle fracture);  s/p L5-S1 ANTERIOR LUMBAR FUSION on 03/06/2023; Diverticulosis large intestine w/o perforation or abscess w/bleeding; Essential hypertension; Rectal bleeding; Hx of adenomatous colonic polyps; Personal hx of gastric bypass; Erectile dysfunction; Sleep disturbance; Lumbar spondylolysis; Microcytic anemia; Iron deficiency anemia; Numbness and tingling; Imbalance; Difficulty sleeping; Disability of walking; Pain in joint involving ankle and foot; Degeneration of lumbar intervertebral disc; Erosive esophagitis; Mood disorder (HCC); Displaced supracondylar fracture of distal end of right femur without intracondylar extension (HCC); Anxiety; Vitamin D deficiency; Metabolic bone disease; and Radiculopathy, lumbar region. Please see above for relevant surgery history; osteoporosis, bowel/urinary incontinence, stumbling and dropping things. Patient denies hx of cancer, stroke, seizures, lung problems, heart problems, diabetes, and unexplained weight loss.   PAIN:  Are you having pain? Yes 5/10 knee. Sore. Clicking.   PRECAUTIONS: Knee  RED FLAGS: Bowel or bladder incontinence: Yes: bowel     WEIGHT BEARING RESTRICTIONS: No  FALLS:  Has patient fallen in last 6 months? 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  NEXT MD VISIT: Next month- Dr. Magnus Ivan  OBJECTIVE:  Note: Objective measures were completed at Evaluation unless otherwise noted.  DIAGNOSTIC FINDINGS:  Narrative & Impression  CLINICAL DATA:  Status post right knee replacement.   EXAM: PORTABLE RIGHT KNEE - 1-2 VIEW   COMPARISON:  06/22/2023   FINDINGS: Right knee arthroplasty in expected alignment. No periprosthetic lucency or fracture. There has been patellar resurfacing. Recent postsurgical change includes air and edema in the soft tissues and joint space. Chronic ghost tracks in the distal femur. Anterior skin staples in place.   IMPRESSION: Right knee arthroplasty without immediate postoperative complication.     Electronically Signed   By: Narda Rutherford M.D.   On: 09/19/2023 16:43    PATIENT SURVEYS:  FOTO 39 with goal of 54  COGNITION: Overall cognitive status: Within functional limits for tasks assessed     SENSATION: Light touch: Impaired - reported dead zone lateral joint line  EDEMA:  Circumferential:  1)bottom of incision=38; 2)10 cm above distal incision-=48, 3) top of incision= 49    POSTURE: No Significant  postural limitations  PALPATION: Global tenderness along right knee- joint line and posterior knee  LOWER EXTREMITY ROM:  Active ROM Right eval Left eval  Hip flexion    Hip extension    Hip abduction    Hip adduction    Hip internal rotation    Hip external rotation    Knee flexion 90deg   Knee extension Lack 12 deg   Ankle dorsiflexion    Ankle plantarflexion    Ankle inversion    Ankle eversion     (Blank rows = not tested)    TODAY'S TREATMENT: DATE: 01/18/24     Nustep LE only- L1-3  x 5 min for ROM- continuously monitoring for pain and exertion level   Physical  therapy treatment session today consisted of completing assessment of goals and administration of testing as demonstrated and documented in flow sheet, treatment, and goals section of this note. Addition treatments may be found below.  Discussion of Home treatment:          PATIENT EDUCATION:  Education details: Purpose of PT, HEP, Pain management strategies including Ice, ROM, taking meds as prescribed. Pt educated throughout session about proper posture and technique with exercises. Improved exercise technique, movement at target joints, use of target muscles after min to mod verbal, visual, tactile cues.   Person educated: Patient Education method: Explanation, Demonstration, Tactile cues, Verbal cues, and Handouts Education comprehension: verbalized understanding, returned demonstration, verbal cues required, tactile cues required, and needs further education  HOME EXERCISE PROGRAM: Access Code: [email or web address] URL: [email or web address] Date: 10/25/2023 Prepared by: Maureen Ralphs  Exercises - Standing Hip Flexion March  - 1 x daily - 7 x weekly - 3 sets - 10 reps - Standing Hip Abduction with Counter Support  - 1 x daily - 7 x weekly - 3 sets - 10 reps - Standing Hip Extension with Counter Support  - 1 x daily - 7 x weekly - 3 sets - 10 reps - Standing Knee Flexion with Unilateral Counter Support  - 1 x daily - 3 sets - 10 reps - Mini Squat with Counter Support  - 1 x daily - 3 sets - 10 reps - Heel Toe Raises with Unilateral Counter Support  - 1 x daily - 1 x weekly - 3 sets - 10 reps - Toe Raises with Counter Support  - 1 x daily - 1 x weekly - 3 sets - 10 reps  Access Code: [employer] URL: [email or web address] Date: 10/11/2023 Prepared by: Maureen Ralphs  Exercises - Seated Hamstring Stretch  - 2 x daily - 3-5 sets - 30-45 hold - Seated Knee Flexion Stretch  - 2 x daily - 3-5 sets - 30-45 hold  ASSESSMENT:  CLINICAL IMPRESSION:   Patient presents today with excellent motivation despite ongoing low back pain. He performed well with all testing despite his pain and met all goals assessed today. He presents with improving overall pain, much improved mobility- able to walk without AD and improved gait speed with decreased risk of falling. He also perceives himself as doing better as seen on most recent FOTO Score. He presents with improved active knee flex and ext and appropriate for discharge today with all goals met.     OBJECTIVE IMPAIRMENTS: Abnormal gait, decreased activity tolerance, decreased balance, decreased endurance, decreased mobility, difficulty walking, decreased ROM, decreased strength, hypomobility, impaired flexibility, and pain.   ACTIVITY LIMITATIONS: carrying, lifting, bending, sitting, standing, squatting, sleeping, stairs, transfers, bed mobility, and toileting  PARTICIPATION LIMITATIONS: meal prep,  cleaning, laundry, driving, shopping, community activity, occupation, and yard work  PERSONAL FACTORS: 1-2 comorbidities: chronic pain, HTN  are also affecting patient's functional outcome.   REHAB POTENTIAL: Good  CLINICAL DECISION MAKING: Stable/uncomplicated  EVALUATION COMPLEXITY: Low   GOALS: Goals reviewed with patient? Yes  SHORT TERM GOALS: Target date: 11/08/2023 Pt will be independent with HEP in order to decrease ankle pain and increase strength in order to improve pain-free function at home and work.  Baseline: EVAL- No formal HEP in place; 12/10: independent  Goal status: ACHIEVED    LONG TERM GOALS: Target date: 12/06/2023  Pt will decrease worst pain as reported on NPRS by at least 3 points in order to demonstrate clinically significant reduction in ankle/foot pain.  Baseline: EVAL- R knee pain = 7-8/10; 12/05/23: 6-7/10 worst pain  12/07/23: 6-7 over past 7 days 1/15: 5/10 at worst 4/10 at best 01/18/24= 5/10 Goal status: MET  2.  Pt will improve FOTO to target score of 54 to  display perceived improvements in ability to complete ADL's.  Baseline: EVAL= 39;  12/05/23: 58 12/07/23: 49 1/15: 35 01/18/24=54 Goal status: MET  3.  Patient will improve Right knee AROM extension to <5 deg from zero for improved ability to straighten his leg with all mobility.  Baseline: EVAL= 12 deg; 12/10: ~10 degrees but still quite edematous 12/07/23: 3 deg lacking  1/15: grossly WFL active. Lacking ~ 2-3 full extension at rest.  01/18/2024= 4 deg from zero (measured in supine)  Goal status: MET  4.  Patient will improve Right knee AROM Flexion to > 120 deg for optimal right knee mobility with steps and sitting.  Baseline: EVAL= 90 deg; 12/05/23:  12/07/23: 109 deg 1/15: 117 active. 121 PROM 1/23: 123 active (supine)  Goal status: MET  5.  Patient (> 43 years old) will complete five times sit to stand test in < 15 seconds indicating an increased LE strength and improved balance. Baseline: 11/14: 31.64 sec no UE support  12/07/23: 22.91 sec with no UE 1/15: 19.97 sec UE on knees.  1/23: 13.08 Sec without UE support Goal status: MET   6.  Patient will reduce timed up and go to <11 seconds to reduce fall risk and demonstrate improved transfer/gait ability. Baseline: 11/14 18.96 sec with [facility] 12/07/23: 16.52 sec without AD; 12.76 sec with SPC 1/15: 11.3 with SPC average of 3 trials  01/18/2024= 10.52 sec without AD - avg of 2 trials Goal status: MET  7.  Patient will increase 10 meter walk test to >1.45m/s as to improve gait speed for better community ambulation and to reduce fall risk. Baseline: 11:14 0.43m/s 12/07/23: 0.96 m/s 1/15: 0.39m/s with cane 1/23: 1.04 m/s without cane- avg of 2 trials Goal status: MET    PLAN:  PT FREQUENCY: 1-2x/week  PT DURATION: 8 weeks  PLANNED INTERVENTIONS: 97164- PT Re-evaluation, 97110-Therapeutic exercises, 97530- Therapeutic activity, 97112- Neuromuscular re-education, 97535- Self Care, 40981- Manual therapy, [phone]- Gait training,  [phone]- Orthotic Fit/training, [phone]- Electrical stimulation (manual), [phone]- Ultrasound, Patient/Family education, Balance training, Stair training, Taping, Dry Needling, Joint mobilization, Manual lymph drainage, Scar mobilization, Compression bandaging, Cryotherapy, and Moist heat  PLAN FOR NEXT SESSION:   Discharge to Home program with all goals met.  Louis Meckel, PT Physical Therapist - [facility]  9:07 AM 01/18/24

## 2024-01-17 NOTE — Progress Notes (Signed)
PCP - Dr. Tillman Abide Cardiologist - denies  PPM/ICD - denies Device Orders - n/a Rep Notified - n/a  Chest x-ray - denies EKG - 09/12/23 Stress Test - 2015 ECHO - 10/26/21 Cardiac Cath - denies  Sleep Study - OSA+ but has not needed a CPAP since his gastric bypass surgery  No DM  Last dose of GLP1 agonist-  n/a GLP1 instructions: n/a  Blood Thinner Instructions: n/a Aspirin Instructions: n/a  ERAS Protcol - clears until 0430 PRE-SURGERY Ensure or G2- Ensure as ordered  COVID TEST- n/a   Anesthesia review: yes - elevated BP in PAT  Patient's blood pressure was 156/115 on arrival to PAT.  Patient states the he did take his lisinopril and hydrochlrothiazide this morning.  Patient states that his blood pressure is normally not this high.  Patient agrees to check blood pressure at home and will reach out to PCP this week if blood pressures remain elevated.  Blood pressure 148/103 prior to leaving PAT.  Antionette Poles was notified.    Patient denies shortness of breath, fever, cough and chest pain at PAT appointment   All instructions explained to the patient, with a verbal understanding of the material. Patient agrees to go over the instructions while at home for a better understanding. Patient also instructed to self quarantine after being tested for COVID-19. The opportunity to ask questions was provided.

## 2024-01-18 ENCOUNTER — Ambulatory Visit: Payer: Medicaid Other

## 2024-01-18 ENCOUNTER — Encounter: Payer: Self-pay | Admitting: Internal Medicine

## 2024-01-18 DIAGNOSIS — M25561 Pain in right knee: Secondary | ICD-10-CM

## 2024-01-18 DIAGNOSIS — M6281 Muscle weakness (generalized): Secondary | ICD-10-CM | POA: Diagnosis not present

## 2024-01-18 DIAGNOSIS — R269 Unspecified abnormalities of gait and mobility: Secondary | ICD-10-CM

## 2024-01-18 DIAGNOSIS — M2560 Stiffness of unspecified joint, not elsewhere classified: Secondary | ICD-10-CM

## 2024-01-18 DIAGNOSIS — R262 Difficulty in walking, not elsewhere classified: Secondary | ICD-10-CM

## 2024-01-18 DIAGNOSIS — M25661 Stiffness of right knee, not elsewhere classified: Secondary | ICD-10-CM

## 2024-01-18 DIAGNOSIS — Z7689 Persons encountering health services in other specified circumstances: Secondary | ICD-10-CM | POA: Diagnosis not present

## 2024-01-22 NOTE — H&P (Signed)
Orthopedic Spine Surgery H&P Note  Assessment: Patient is a 58 y.o. male with persistent low back pain after L5/S1 fusion   Plan: -Out of bed as tolerated, activity as tolerated, no brace -Covered the risks of surgery one more time with the patient and patient elected to proceed with planned surgery -Written consent verified -Hold anticoagulation in anticipation of surgery -Ancef and TXA on all to OR -NPO for procedure -Site marked -To OR when ready  I went over the risks of surgery again this morning.  I explained that the risks of surgery included but were not limited to: death, DVT/PE, heart attack, pseudoarthrosis, infection, need for additional procedures, persistent back pain.  I spent significant time explaining that there are many causes of back pain and there is a good chance that he still has back pain after removing the screws.  He does have a thin body habitus and has pain over the area of the instrumentation and notes pain is worse when sitting against a chair with a back.  He states his back pain has been severe and he has been taking Dilaudid to control his pain.  Accordingly, he is willing to try removing the screws.  After our discussion, patient elected to proceed with surgery.  ___________________________________________________________________________  Chief Complaint: Low back pain  History: Patient is 58 y.o. male who has previously undergone L5/S1 ALIF and PSIF for indirect decompression of an L5 radiculopathy.  His leg pain resolved after surgery.  However, he has had persistent low back pain.  His back pain has been severe in nature.  He is taking Dilaudid to control the pain.  He is in pain management.  We had previously talked over the phone in the office about hardware removal as something that could be done to see if that would help with his back pain.  As stated above, I told him that there are also many other reasons that he could be having back pain.  His symptoms  failed to improve with conservative treatment and his pain was severe in nature that he wanted to have the screws removed if that would help with his pain.  Accordingly, operative management was discussed with him. The patient presents today with no changes in his symptoms since the last office visit. See previous office note for further details.    Review of systems: General: denies fevers and chills, myalgias Neurologic: denies recent changes in vision, slurred speech Abdomen: denies nausea, vomiting, hematemesis Respiratory: denies cough, shortness of breath  Past medical history: Chronic pain Depression Anxiety GERD Irritable bowel syndrome Diverticulitis Sleep apnea Osteoporosis (treated with bisphosphonates)   Allergies: NKDA   Past surgical history:  Right femur fracture ORIF L1 kyphoplasty Right Achilles tendon repair Cataract surgery Cholecystectomy Colon resection Gastric bypass Hiatal hernia repair Polypectomy Right TKA   Social history: Denies use of nicotine product (smoking, vaping, patches, smokeless) Alcohol use: Yes, 0.5 drinks per week Denies recreational drug use   Family history: -reviewed and not pertinent to persistent low back pain   Physical Exam:  BMI of 24.6  General: no acute distress, appears stated age Neurologic: alert, answering questions appropriately, following commands Cardiovascular: regular rate, no cyanosis Respiratory: unlabored breathing on room air, symmetric chest rise Psychiatric: appropriate affect, normal cadence to speech   MSK (spine):  -Strength exam      Left  Right  EHL    5/5  5/5 TA    5/5  5/5 GSC    5/5  5/5 Knee  extension  5/5  5/5 Knee flexion   5/5  5/5 Hip flexion   5/5  5/5  -Sensory exam    Sensation intact to light touch in L3-S1 nerve distributions of bilateral lower extremities   Patient name: Harold Smith Patient MRN: [id number] Date: 01/23/2024  Preoperative scores  ODI:  66 VAS back pain: 10/10 Vas leg pain: 1/10

## 2024-01-23 ENCOUNTER — Ambulatory Visit (HOSPITAL_COMMUNITY)
Admission: RE | Admit: 2024-01-23 | Discharge: 2024-01-23 | Disposition: A | Discharge status: Home / Self Care | Payer: Medicaid Other | Source: Ambulatory Visit | Attending: Orthopedic Surgery | Admitting: Orthopedic Surgery

## 2024-01-23 ENCOUNTER — Ambulatory Visit (HOSPITAL_COMMUNITY): Payer: Medicaid Other

## 2024-01-23 ENCOUNTER — Encounter (HOSPITAL_COMMUNITY)
Admission: RE | Disposition: A | Discharge status: Home / Self Care | Payer: Self-pay | Source: Ambulatory Visit | Attending: Orthopedic Surgery

## 2024-01-23 ENCOUNTER — Ambulatory Visit (HOSPITAL_BASED_OUTPATIENT_CLINIC_OR_DEPARTMENT_OTHER): Payer: Medicaid Other | Admitting: Physician Assistant

## 2024-01-23 ENCOUNTER — Other Ambulatory Visit: Payer: Self-pay

## 2024-01-23 ENCOUNTER — Encounter (HOSPITAL_COMMUNITY): Payer: Self-pay | Admitting: Orthopedic Surgery

## 2024-01-23 ENCOUNTER — Ambulatory Visit (HOSPITAL_COMMUNITY): Payer: Medicaid Other | Admitting: Physician Assistant

## 2024-01-23 DIAGNOSIS — I10 Essential (primary) hypertension: Secondary | ICD-10-CM | POA: Insufficient documentation

## 2024-01-23 DIAGNOSIS — M545 Low back pain, unspecified: Secondary | ICD-10-CM | POA: Insufficient documentation

## 2024-01-23 DIAGNOSIS — Z981 Arthrodesis status: Secondary | ICD-10-CM | POA: Diagnosis not present

## 2024-01-23 DIAGNOSIS — Z79899 Other long term (current) drug therapy: Secondary | ICD-10-CM | POA: Diagnosis not present

## 2024-01-23 DIAGNOSIS — M47816 Spondylosis without myelopathy or radiculopathy, lumbar region: Secondary | ICD-10-CM

## 2024-01-23 DIAGNOSIS — Z7689 Persons encountering health services in other specified circumstances: Secondary | ICD-10-CM | POA: Diagnosis not present

## 2024-01-23 DIAGNOSIS — K219 Gastro-esophageal reflux disease without esophagitis: Secondary | ICD-10-CM | POA: Insufficient documentation

## 2024-01-23 DIAGNOSIS — Y832 Surgical operation with anastomosis, bypass or graft as the cause of abnormal reaction of the patient, or of later complication, without mention of misadventure at the time of the procedure: Secondary | ICD-10-CM | POA: Insufficient documentation

## 2024-01-23 DIAGNOSIS — Z9689 Presence of other specified functional implants: Secondary | ICD-10-CM | POA: Diagnosis not present

## 2024-01-23 DIAGNOSIS — Z87891 Personal history of nicotine dependence: Secondary | ICD-10-CM | POA: Insufficient documentation

## 2024-01-23 DIAGNOSIS — T8484XA Pain due to internal orthopedic prosthetic devices, implants and grafts, initial encounter: Secondary | ICD-10-CM | POA: Insufficient documentation

## 2024-01-23 DIAGNOSIS — M5136 Other intervertebral disc degeneration, lumbar region with discogenic back pain only: Secondary | ICD-10-CM

## 2024-01-23 DIAGNOSIS — Z01818 Encounter for other preprocedural examination: Secondary | ICD-10-CM

## 2024-01-23 HISTORY — PX: HARDWARE REMOVAL: SHX979

## 2024-01-23 SURGERY — REMOVAL, HARDWARE
Anesthesia: General | Site: Spine Lumbar

## 2024-01-23 MED ORDER — HYDROMORPHONE HCL 2 MG PO TABS: 4.0000 mg | ORAL_TABLET | ORAL | 0 refills | Status: AC | PRN

## 2024-01-23 MED ORDER — PHENYLEPHRINE 80 MCG/ML (10ML) SYRINGE FOR IV PUSH (FOR BLOOD PRESSURE SUPPORT)
PREFILLED_SYRINGE | INTRAVENOUS | Status: DC | PRN
  Administered 2024-01-23 (×3): 160 ug via INTRAVENOUS

## 2024-01-23 MED ORDER — ORAL CARE MOUTH RINSE: 15.0000 mL | Freq: Once | OROMUCOSAL | Status: AC

## 2024-01-23 MED ORDER — BUPIVACAINE-EPINEPHRINE (PF) 0.25% -1:200000 IJ SOLN
INTRAMUSCULAR | Status: AC
Start: 2024-01-23 — End: ?
  Filled 2024-01-23: qty 30

## 2024-01-23 MED ORDER — DEXAMETHASONE SODIUM PHOSPHATE 10 MG/ML IJ SOLN
10.0000 mg | Freq: Once | INTRAMUSCULAR | Status: AC
  Administered 2024-01-23: 10 mg via INTRAVENOUS

## 2024-01-23 MED ORDER — ACETAMINOPHEN 10 MG/ML IV SOLN
INTRAVENOUS | Status: DC | PRN
  Administered 2024-01-23: 1000 mg via INTRAVENOUS

## 2024-01-23 MED ORDER — CHLORHEXIDINE GLUCONATE 0.12 % MT SOLN: 15.0000 mL | Freq: Once | OROMUCOSAL | Status: AC

## 2024-01-23 MED ORDER — HYDROMORPHONE HCL 1 MG/ML IJ SOLN
INTRAMUSCULAR | Status: AC
  Filled 2024-01-23: qty 1

## 2024-01-23 MED ORDER — 0.9 % SODIUM CHLORIDE (POUR BTL) OPTIME
TOPICAL | Status: DC | PRN
  Administered 2024-01-23: 1000 mL

## 2024-01-23 MED ORDER — SENNA 8.6 MG PO TABS: 1.0000 | ORAL_TABLET | Freq: Two times a day (BID) | ORAL | 0 refills | Status: AC

## 2024-01-23 MED ORDER — ONDANSETRON HCL 4 MG/2ML IJ SOLN
INTRAMUSCULAR | Status: AC
  Filled 2024-01-23: qty 2

## 2024-01-23 MED ORDER — TRANEXAMIC ACID-NACL 1000-0.7 MG/100ML-% IV SOLN
1000.0000 mg | INTRAVENOUS | Status: AC
  Administered 2024-01-23: 1000 mg via INTRAVENOUS

## 2024-01-23 MED ORDER — PROPOFOL 10 MG/ML IV BOLUS
INTRAVENOUS | Status: AC
  Filled 2024-01-23: qty 20

## 2024-01-23 MED ORDER — ONDANSETRON HCL 4 MG/2ML IJ SOLN
INTRAMUSCULAR | Status: DC | PRN
  Administered 2024-01-23: 4 mg via INTRAVENOUS

## 2024-01-23 MED ORDER — METHOCARBAMOL 750 MG PO TABS: 750.0000 mg | ORAL_TABLET | Freq: Four times a day (QID) | ORAL | 0 refills | Status: DC

## 2024-01-23 MED ORDER — FENTANYL CITRATE (PF) 250 MCG/5ML IJ SOLN
INTRAMUSCULAR | Status: AC
  Filled 2024-01-23: qty 5

## 2024-01-23 MED ORDER — METHYLPREDNISOLONE ACETATE 80 MG/ML IJ SUSP
INTRAMUSCULAR | Status: AC
  Filled 2024-01-23: qty 1

## 2024-01-23 MED ORDER — KETAMINE HCL 50 MG/5ML IJ SOSY
PREFILLED_SYRINGE | INTRAMUSCULAR | Status: AC
  Filled 2024-01-23: qty 5

## 2024-01-23 MED ORDER — ACETAMINOPHEN 500 MG PO TABS: 1000.0000 mg | ORAL_TABLET | Freq: Once | ORAL | Status: DC | PRN

## 2024-01-23 MED ORDER — CEFAZOLIN SODIUM-DEXTROSE 2-4 GM/100ML-% IV SOLN
2.0000 g | INTRAVENOUS | Status: AC
  Administered 2024-01-23: 2 g via INTRAVENOUS

## 2024-01-23 MED ORDER — CEFAZOLIN SODIUM-DEXTROSE 2-4 GM/100ML-% IV SOLN
INTRAVENOUS | Status: AC
  Filled 2024-01-23: qty 100

## 2024-01-23 MED ORDER — POVIDONE-IODINE 10 % EX SWAB: 2.0000 | Freq: Once | CUTANEOUS | Status: DC

## 2024-01-23 MED ORDER — ACETAMINOPHEN 10 MG/ML IV SOLN: 1000.0000 mg | Freq: Once | INTRAVENOUS | Status: DC | PRN

## 2024-01-23 MED ORDER — CHLORHEXIDINE GLUCONATE 0.12 % MT SOLN
OROMUCOSAL | Status: AC
  Administered 2024-01-23: 15 mL via OROMUCOSAL
  Filled 2024-01-23: qty 15

## 2024-01-23 MED ORDER — FENTANYL CITRATE (PF) 250 MCG/5ML IJ SOLN
INTRAMUSCULAR | Status: DC | PRN
  Administered 2024-01-23: 50 ug via INTRAVENOUS
  Administered 2024-01-23 (×2): 100 ug via INTRAVENOUS

## 2024-01-23 MED ORDER — HYDROMORPHONE HCL 1 MG/ML IJ SOLN
INTRAMUSCULAR | Status: AC
  Filled 2024-01-23: qty 0.5

## 2024-01-23 MED ORDER — KETAMINE HCL 10 MG/ML IJ SOLN
INTRAMUSCULAR | Status: DC | PRN
  Administered 2024-01-23: 25 mg via INTRAVENOUS

## 2024-01-23 MED ORDER — MIDAZOLAM HCL 2 MG/2ML IJ SOLN
INTRAMUSCULAR | Status: DC | PRN
  Administered 2024-01-23: 2 mg via INTRAVENOUS

## 2024-01-23 MED ORDER — EPHEDRINE SULFATE-NACL 50-0.9 MG/10ML-% IV SOSY
PREFILLED_SYRINGE | INTRAVENOUS | Status: DC | PRN
  Administered 2024-01-23 (×2): 5 mg via INTRAVENOUS

## 2024-01-23 MED ORDER — OXYCODONE HCL 5 MG PO TABS
5.0000 mg | ORAL_TABLET | Freq: Once | ORAL | Status: AC | PRN
  Administered 2024-01-23: 5 mg via ORAL

## 2024-01-23 MED ORDER — ROCURONIUM BROMIDE 10 MG/ML (PF) SYRINGE
PREFILLED_SYRINGE | INTRAVENOUS | Status: DC | PRN
  Administered 2024-01-23: 70 mg via INTRAVENOUS

## 2024-01-23 MED ORDER — VANCOMYCIN HCL 1000 MG IV SOLR
INTRAVENOUS | Status: AC
  Filled 2024-01-23: qty 20

## 2024-01-23 MED ORDER — LACTATED RINGERS IV SOLN: INTRAVENOUS | Status: DC

## 2024-01-23 MED ORDER — OXYCODONE HCL 5 MG/5ML PO SOLN: 5.0000 mg | Freq: Once | ORAL | Status: AC | PRN

## 2024-01-23 MED ORDER — PROPOFOL 10 MG/ML IV BOLUS
INTRAVENOUS | Status: DC | PRN
  Administered 2024-01-23: 130 mg via INTRAVENOUS

## 2024-01-23 MED ORDER — ACETAMINOPHEN 160 MG/5ML PO SOLN: 1000.0000 mg | Freq: Once | ORAL | Status: DC | PRN

## 2024-01-23 MED ORDER — TRANEXAMIC ACID-NACL 1000-0.7 MG/100ML-% IV SOLN
INTRAVENOUS | Status: AC
  Filled 2024-01-23: qty 100

## 2024-01-23 MED ORDER — HYDROMORPHONE HCL 1 MG/ML IJ SOLN
0.2500 mg | INTRAMUSCULAR | Status: DC | PRN
  Administered 2024-01-23 (×2): 0.5 mg via INTRAVENOUS

## 2024-01-23 MED ORDER — ACETAMINOPHEN 10 MG/ML IV SOLN
INTRAVENOUS | Status: AC
  Filled 2024-01-23: qty 100

## 2024-01-23 MED ORDER — HYDROMORPHONE HCL 1 MG/ML IJ SOLN
INTRAMUSCULAR | Status: DC | PRN
  Administered 2024-01-23: .5 mg via INTRAVENOUS

## 2024-01-23 MED ORDER — ALBUMIN HUMAN 5 % IV SOLN: INTRAVENOUS | Status: DC | PRN

## 2024-01-23 MED ORDER — BUPIVACAINE-EPINEPHRINE (PF) 0.25% -1:200000 IJ SOLN
INTRAMUSCULAR | Status: DC | PRN
  Administered 2024-01-23: 30 mL

## 2024-01-23 MED ORDER — VASOPRESSIN 20 UNIT/ML IV SOLN
INTRAVENOUS | Status: DC | PRN
  Administered 2024-01-23: .5 [IU] via INTRAVENOUS
  Administered 2024-01-23: 1 [IU] via INTRAVENOUS
  Administered 2024-01-23: .5 [IU] via INTRAVENOUS

## 2024-01-23 MED ORDER — OXYCODONE HCL 5 MG PO TABS
ORAL_TABLET | ORAL | Status: AC
  Filled 2024-01-23: qty 1

## 2024-01-23 MED ORDER — DEXAMETHASONE SODIUM PHOSPHATE 10 MG/ML IJ SOLN
INTRAMUSCULAR | Status: AC
  Filled 2024-01-23: qty 1

## 2024-01-23 MED ORDER — MIDAZOLAM HCL 2 MG/2ML IJ SOLN
INTRAMUSCULAR | Status: AC
Start: 2024-01-23 — End: ?
  Filled 2024-01-23: qty 2

## 2024-01-23 MED ORDER — SUGAMMADEX SODIUM 200 MG/2ML IV SOLN
INTRAVENOUS | Status: DC | PRN
  Administered 2024-01-23: 200 mg via INTRAVENOUS

## 2024-01-23 MED ORDER — PHENYLEPHRINE HCL-NACL 20-0.9 MG/250ML-% IV SOLN
INTRAVENOUS | Status: DC | PRN
  Administered 2024-01-23: 35 ug/min via INTRAVENOUS

## 2024-01-23 MED ORDER — LIDOCAINE 2% (20 MG/ML) 5 ML SYRINGE
INTRAMUSCULAR | Status: AC
  Filled 2024-01-23: qty 5

## 2024-01-23 MED ORDER — LIDOCAINE 2% (20 MG/ML) 5 ML SYRINGE
INTRAMUSCULAR | Status: DC | PRN
  Administered 2024-01-23: 80 mg via INTRAVENOUS
  Administered 2024-01-23: 20 mg via INTRAVENOUS

## 2024-01-23 SURGICAL SUPPLY — 48 items
BENZOIN TINCTURE PRP APPL 2/3 (GAUZE/BANDAGES/DRESSINGS) ×1 IMPLANT
BUR NEURO DRILL SOFT 3.0X3.8M (BURR) ×1 IMPLANT
CANISTER SUCT 3000ML PPV (MISCELLANEOUS) ×1 IMPLANT
CLSR STERI-STRIP ANTIMIC 1/2X4 (GAUZE/BANDAGES/DRESSINGS) ×1 IMPLANT
CORD BIPOLAR FORCEPS 12FT (ELECTRODE) IMPLANT
COVER SURGICAL LIGHT HANDLE (MISCELLANEOUS) ×1 IMPLANT
DRAIN HEMOVAC 7FR (DRAIN) ×2 IMPLANT
DRAPE C-ARM 42X72 X-RAY (DRAPES) ×1 IMPLANT
DRAPE MICROSCOPE LEICA 54X105 (DRAPES) ×1 IMPLANT
DRAPE SURG 17X23 STRL (DRAPES) ×4 IMPLANT
DRAPE UTILITY XL STRL (DRAPES) ×2 IMPLANT
DRESSING MEPILEX FLEX 4X4 (GAUZE/BANDAGES/DRESSINGS) ×1 IMPLANT
DRSG MEPILEX FLEX 4X4 (GAUZE/BANDAGES/DRESSINGS) ×1
DRSG MEPILEX POST OP 4X8 (GAUZE/BANDAGES/DRESSINGS) IMPLANT
DRSG TEGADERM 4X10 (GAUZE/BANDAGES/DRESSINGS) ×1 IMPLANT
DURAPREP 26ML APPLICATOR (WOUND CARE) ×1 IMPLANT
ELECT COATED BLADE 2.86 ST (ELECTRODE) ×1 IMPLANT
ELECT PENCIL ROCKER SW 15FT (MISCELLANEOUS) ×1 IMPLANT
ELECT REM PT RETURN 9FT ADLT (ELECTROSURGICAL) ×1
ELECTRODE REM PT RTRN 9FT ADLT (ELECTROSURGICAL) ×1 IMPLANT
GAUZE SPONGE 4X4 12PLY STRL (GAUZE/BANDAGES/DRESSINGS) ×1 IMPLANT
GLOVE BIO SURGEON STRL SZ7.5 (GLOVE) ×1 IMPLANT
GLOVE BIOGEL PI IND STRL 7.5 (GLOVE) ×1 IMPLANT
GLOVE INDICATOR 8.0 STRL GRN (GLOVE) ×1 IMPLANT
GOWN STRL REUS W/ TWL XL LVL3 (GOWN DISPOSABLE) ×1 IMPLANT
KIT BASIN OR (CUSTOM PROCEDURE TRAY) ×1 IMPLANT
KIT POSITION SURG JACKSON T1 (MISCELLANEOUS) ×1 IMPLANT
KIT TURNOVER KIT B (KITS) ×1 IMPLANT
NDL 22X1.5 STRL (OR ONLY) (MISCELLANEOUS) ×1 IMPLANT
NEEDLE 22X1.5 STRL (OR ONLY) (MISCELLANEOUS) ×1
NS IRRIG 1000ML POUR BTL (IV SOLUTION) ×1 IMPLANT
PACK LAMINECTOMY ORTHO (CUSTOM PROCEDURE TRAY) ×1 IMPLANT
PATTIES SURGICAL .5 X.5 (GAUZE/BANDAGES/DRESSINGS) IMPLANT
SPONGE SURGIFOAM ABS GEL 100 (HEMOSTASIS) ×1 IMPLANT
SPONGE T-LAP 4X18 ~~LOC~~+RFID (SPONGE) ×1 IMPLANT
STRIP CLOSURE SKIN 1/2X4 (GAUZE/BANDAGES/DRESSINGS) IMPLANT
SUCTION TUBE FRAZIER 10FR DISP (SUCTIONS) ×1 IMPLANT
SUT BONE WAX W31G (SUTURE) ×1 IMPLANT
SUT MNCRL AB 3-0 PS2 18 (SUTURE) IMPLANT
SUT VIC AB 0 CT1 18XCR BRD8 (SUTURE) ×1 IMPLANT
SUT VIC AB 2-0 CT1 18 (SUTURE) ×1 IMPLANT
SUT VIC AB 3-0 PS2 18XBRD (SUTURE) ×1 IMPLANT
SYR BULB IRRIG 60ML STRL (SYRINGE) ×1 IMPLANT
SYR CONTROL 10ML LL (SYRINGE) ×1 IMPLANT
TOWEL GREEN STERILE (TOWEL DISPOSABLE) ×1 IMPLANT
TOWEL GREEN STERILE FF (TOWEL DISPOSABLE) ×1 IMPLANT
TUBING FEATHERFLOW (TUBING) ×1 IMPLANT
WATER STERILE IRR 1000ML POUR (IV SOLUTION) ×1 IMPLANT

## 2024-01-23 NOTE — Brief Op Note (Signed)
01/23/2024  9:23 AM  PATIENT:  Ilona Sorrel  58 y.o. male  PRE-OPERATIVE DIAGNOSIS:  SYMPTOMATIC RETAINED HARDWARE LSPINE  POST-OPERATIVE DIAGNOSIS:  HARDWARE REMOVED  PROCEDURE:  Procedure(s): REMOVAL OF POSTERIOR SPINAL INSTRUMENTATION (N/A)  SURGEON:  Surgeons and Role:    London Sheer, MD - Primary  PHYSICIAN ASSISTANT: none  ASSISTANTS: none   ANESTHESIA:   general  EBL:  20cc   BLOOD ADMINISTERED:none  DRAINS: none   LOCAL MEDICATIONS USED:  MARCAINE     SPECIMEN:  No Specimen  DISPOSITION OF SPECIMEN:  N/A  COUNTS:  YES  TOURNIQUET: NONE  DICTATION: .Note written in EPIC  PLAN OF CARE: Discharge to home after PACU  PATIENT DISPOSITION:  PACU - hemodynamically stable.   Delay start of Pharmacological VTE agent (>24hrs) due to surgical blood loss or risk of bleeding: yes

## 2024-01-23 NOTE — Op Note (Signed)
Orthopedic Spine Surgery Operative Report  Procedure: Removal of lumbar posterior spinal instrumentation  Modifier: none  Date of procedure: 01/23/2024  Patient name: Harold Smith MRN: [id number] DOB: 1966-10-18  Surgeon: Willia Craze, MD Assistant: None Pre-operative diagnosis: painful retained orthopedic hardware Post-operative diagnosis: same as above Findings: successful fusion at L5/S1  Specimens: none Anesthesia: general EBL: 20cc Complications: none Pre-incision antibiotic: ancef TXA was given prior to incision as well  Implants: none Removed his L5 and S1 pedicle screws bilaterally  Indication for procedure: Patient is a 58 y.o. male who had lumbar radiculopathy with an L5/S1 spondylolisthesis.  He had previously undergone L5/S1 ALIF with PSIF.  He had persistent low back pain that was severe in nature.  He noticed it was worse if he was sitting on a hard back chair.  He said he could feel the instrumentation in his back.  He was interested in getting it removed.  I explained that there are multiple etiologies for back pain some not confident that removing the instrumentation will give him the relief he is looking for.  He still wanted them removed to see if that could help with his back pain. The risks including but not limited to death, DVT/PE, heart attack, pseudoarthrosis, infection, need for additional procedures, persistent back pain were discussed with the patient.  After covering the surgery, all the patient's questions were answered to their satisfaction. After this discussion, the patient expressed understanding and elected to proceed with surgical intervention.   Procedure Description: The patient was met in the pre-operative holding area. The patient's identity and consent were verified. The operative site was marked. The patient's remaining questions about the surgery were answered. The patient was brought back to the operating room. General anesthesia was  induced and an endotracheal tube was placed by the anesthesia staff. The patient was transferred to the prone Rainelle table in the prone position. All bony prominences were well padded. The head of the bed was slightly elevated and the eyes were free from compression by the face pillow. An electric razor was used to remove hair over the lumbar region. The surgical area was cleansed with alcohol. The patient's skin was then prepped and draped in a standard, sterile fashion. A time out was performed that identified the patient, the procedure, and the operative levels. All team members agreed with what was stated in the time out.   The old incision was used.  A knife was used to sharply dissect down through the skin and dermis.  Electrocautery was then used to dissect down to the level of the spinous processes.  Electrocautery was then used to follow the lamina out to the facet joints.  At that point, the screws were well-visualized.  The same process was repeated for the contralateral side.  Electrocautery was then used to remove the soft tissue off of the L5 and S1 screws.  It was also used to remove the soft tissue off of the rods.  On the right side, there was bridging bone seen between the pedicle screws.  The same was not seen on the left side.  There did not appear to be a successful fusion on that side.  The set caps off all 4 screws were then removed.  The rods were removed removed bilaterally.  A rondure was used to hold onto the pedicle screw as the vertebra was manipulated.  The L5 and S1 lamina and spinous processes were seen to be moving together suggesting successful fusion.  At this  point, all 4 pedicle screws were removed with a screwdriver.  Intraoperative fluoroscopy was used to demonstrate removal of the instrumentation.  The wound was then copiously irrigated with sterile saline. The fascia was reapproximated with 0 vicryl suture. The deep dermal layer was reapproximated with 2-0 vicryl. The skin  as closed with a 3-0 running monocryl. All counts were correct at the end of the case. The incision was dressed with steri strips and benzoin. An island dressing was placed over the wound. The patient was transferred back to a bed and brought to the post-anesthesia care unit by anesthesia staff in stable condition.  Post-operative plan: The patient will recover in the post-anesthesia care unit and then go home. The patient will be out of bed as tolerated with no brace.  The patient will return to the office in 2 weeks.   Willia Craze, MD Orthopedic Surgeon

## 2024-01-23 NOTE — Anesthesia Preprocedure Evaluation (Signed)
Anesthesia Evaluation  Patient identified by MRN, date of birth, ID band Patient awake    Reviewed: Allergy & Precautions, NPO status , Patient's Chart, lab work & pertinent test results  History of Anesthesia Complications Negative for: history of anesthetic complications  Airway Mallampati: II  TM Distance: >3 FB Neck ROM: Full    Dental  (+) Teeth Intact, Dental Advisory Given,    Pulmonary neg shortness of breath, neg sleep apnea, neg COPD, neg recent URI, former smoker   breath sounds clear to auscultation       Cardiovascular hypertension, Pt. on medications (-) angina (-) Past MI and (-) CHF  Rhythm:Regular     Neuro/Psych neg Seizures PSYCHIATRIC DISORDERS Anxiety Depression       GI/Hepatic PUD,GERD  Medicated and Controlled,,  Endo/Other  negative endocrine ROS    Renal/GU negative Renal ROS     Musculoskeletal  (+) Arthritis ,    Abdominal   Peds  Hematology Lab Results      Component                Value               Date                      WBC                      7.7                 01/17/2024                HGB                      14.2                01/17/2024                HCT                      42.8                01/17/2024                MCV                      89.5                01/17/2024                PLT                      365                 01/17/2024              Anesthesia Other Findings Chronic pain meds  Reproductive/Obstetrics                              Anesthesia Physical Anesthesia Plan  ASA: 2  Anesthesia Plan: General   Post-op Pain Management: Ketamine IV* and Ofirmev IV (intra-op)*   Induction: Intravenous  PONV Risk Score and Plan: 2 and Ondansetron and Dexamethasone  Airway Management Planned: Oral ETT  Additional Equipment: None  Intra-op Plan:   Post-operative Plan: Extubation in OR  Informed Consent: I have reviewed  the patients History  and Physical, chart, labs and discussed the procedure including the risks, benefits and alternatives for the proposed anesthesia with the patient or authorized representative who has indicated his/her understanding and acceptance.     Dental advisory given  Plan Discussed with: CRNA  Anesthesia Plan Comments:          Anesthesia Quick Evaluation

## 2024-01-23 NOTE — Anesthesia Procedure Notes (Addendum)
Procedure Name: Intubation Date/Time: 01/23/2024 7:45 AM  Performed by: Lianne Cure, RNPre-anesthesia Checklist: Patient identified, Patient being monitored, Timeout performed, Emergency Drugs available and Suction available Patient Re-evaluated:Patient Re-evaluated prior to induction Oxygen Delivery Method: Circle system utilized Preoxygenation: Pre-oxygenation with 100% oxygen Induction Type: IV induction Ventilation: Mask ventilation without difficulty Laryngoscope Size: Mac and 4 Grade View: Grade I Tube type: Oral Tube size: 7.5 mm Number of attempts: 1 Airway Equipment and Method: Stylet Placement Confirmation: ETT inserted through vocal cords under direct vision, positive ETCO2 and breath sounds checked- equal and bilateral Secured at: 22 cm Tube secured with: Tape Dental Injury: Teeth and Oropharynx as per pre-operative assessment

## 2024-01-23 NOTE — Discharge Instructions (Signed)
Orthopedic Surgery Discharge Instructions  Patient name: Harold Smith Procedure Performed: removal of posterior spinal instrumentation Date of Surgery: 01/23/2024 Surgeon: Willia Craze, MD  Pre-operative Diagnosis: painful retained lumbar hardware Post-operative Diagnosis: same as above  Discharge Date: 01/23/2024 Discharged to: home Discharge Condition: stable  Activity: You should refrain from bending, lifting, or twisting with objects greater than ten pounds until six weeks after surgery. You are encouraged to walk as much as desired. You can perform household activities such as cleaning dishes, doing laundry, vacuuming, etc. as long as the ten-pound restriction is followed. You do not need to wear a brace during the post-operative period.   Incision Care: Your incision site has a dressing over it. That dressing should remain in place and dry at all times for a total of one week after surgery. After one week, you can remove the dressing. Underneath the dressing, you will find pieces of tape. You should leave these pieces of tape in place. They will fall off with time. Do not pick, rub, or scrub at them. Do not put cream or lotion over the surgical area. After one week and once the dressing is off, it is okay to let soap and water run over your incision. Again, do not pick, scrub, or rub at the pieces of tape when bathing. Do not submerge (e.g., take a bath, swim, go in a hot tub, etc.) until six weeks after surgery. There may be some bloody drainage from the incision into the dressing after surgery. This is normal. You do not need to replace the dressing. Continue to leave it in place for the one week as instructed above. Should the dressing become saturated with blood or drainage, please call the office for further instructions.   Medications: You have been prescribed dilaudid. This is a narcotic pain medication and should only be taken as prescribed. You should not drink alcohol or  operate heavy machinery (including driving) while taking this medication. The dilaudid can cause constipation as a side effect. For that reason, you have been prescribed senna. This is a laxative. You do not need to take this medication if you develop diarrhea. Should you remain constipated even while taking these medications, please use over the counter miralax. Robaxin is a muscle relaxer that has been prescribed to you for muscle spasm type pain. Take this medication as needed.   You can use over-the-counter NSAIDs (ibuprofen, Aleve, Celebrex, naproxen, meloxicam, etc.) for additional pain relief after this surgery. These medications are safe to take with the Tylenol you have been prescribed. You should not take these medications if you have or have had kidney problems or gastrointestinal ulcers. Take these medications as instructed on the packaging.   In order to set expectations for opioid prescriptions, you will only be prescribed opioids for a total of six weeks after surgery and, at two-weeks after surgery, your opioid prescription will start to tapered (decreased dosage and number of pills). If you have ongoing need for opioid medication six weeks after surgery, you will be referred to pain management. If you are already established with a provider that is giving you opioid medications, you should schedule an appointment with them for six weeks after surgery if you feel you are going to need another prescription. State law only allows for opioid prescriptions one week at a time. If you are running out of opioid medication near the end of the week, please call the office during business hours before running out so I can send  you another prescription.   You may resume any home blood thinners (warfarin, lovenox, apixaban, plavix, xarelto, etc) 72 hours after your surgery. Take these medications as they were previously prescribed.  Driving: You should not drive while taking narcotic pain medications.  You should start getting back to driving slowly and you may want to try driving in a parking lot before doing anything more.   Diet: You are safe to resume your regular diet after surgery.   Reasons to Call the Office After Surgery: You should feel free to call the office with any concerns or questions you have in the post-operative period, but you should definitely notify the office if you develop: -shortness of breath, chest pain, or trouble breathing -excessive bleeding, drainage, redness, or swelling around the surgical site -fevers, chills, or pain that is getting worse with each passing day -persistent nausea or vomiting -new weakness in either leg -new or worsening numbness or tingling in either leg -numbness in the groin, bowel or bladder incontinence -other concerns about your surgery  Follow Up Appointments: You should have an office appointment scheduled for approximately two weeks after surgery. If you do not remember when this appointment is or do not already have it scheduled, please call the office to schedule.   Office Information:  -Willia Craze, MD -Phone number: [phone] -Address: [address]       [place], [place] [address]

## 2024-01-23 NOTE — Transfer of Care (Signed)
Immediate Anesthesia Transfer of Care Note  Patient: Harold Smith  Procedure(s) Performed: REMOVAL OF POSTERIOR SPINAL INSTRUMENTATION (Spine Lumbar)  Patient Location: PACU  Anesthesia Type:General  Level of Consciousness: awake, drowsy, and patient cooperative  Airway & Oxygen Therapy: Patient Spontanous Breathing and Patient connected to face mask oxygen  Post-op Assessment: Report given to RN, Post -op Vital signs reviewed and stable, and Patient moving all extremities X 4  Post vital signs: Reviewed and stable  Last Vitals:  Vitals Value Taken Time  BP 141/88 01/23/24 0931  Temp    Pulse 96 01/23/24 0934  Resp 15 01/23/24 0934  SpO2 99 % 01/23/24 0934  Vitals shown include unfiled device data.  Last Pain:  Vitals:   01/23/24 0612  TempSrc:   PainSc: 8          Complications: No notable events documented.

## 2024-01-24 ENCOUNTER — Encounter (HOSPITAL_COMMUNITY): Payer: Self-pay | Admitting: Orthopedic Surgery

## 2024-01-24 NOTE — Anesthesia Postprocedure Evaluation (Signed)
Anesthesia Post Note  Patient: Harold Smith  Procedure(s) Performed: REMOVAL OF POSTERIOR SPINAL INSTRUMENTATION (Spine Lumbar)     Patient location during evaluation: PACU Anesthesia Type: General Level of consciousness: awake and alert Pain management: pain level controlled Vital Signs Assessment: post-procedure vital signs reviewed and stable Respiratory status: spontaneous breathing, nonlabored ventilation and respiratory function stable Cardiovascular status: blood pressure returned to baseline and stable Postop Assessment: no apparent nausea or vomiting Anesthetic complications: no   No notable events documented.                  Makaley Storts

## 2024-01-25 ENCOUNTER — Encounter: Payer: 59 | Admitting: Physical Therapy

## 2024-01-27 DIAGNOSIS — Z419 Encounter for procedure for purposes other than remedying health state, unspecified: Secondary | ICD-10-CM | POA: Diagnosis not present

## 2024-01-31 ENCOUNTER — Other Ambulatory Visit (INDEPENDENT_AMBULATORY_CARE_PROVIDER_SITE_OTHER): Payer: Medicaid Other

## 2024-01-31 ENCOUNTER — Encounter: Payer: Self-pay | Admitting: Orthopedic Surgery

## 2024-01-31 ENCOUNTER — Encounter: Payer: Self-pay | Admitting: Orthopaedic Surgery

## 2024-01-31 ENCOUNTER — Ambulatory Visit (INDEPENDENT_AMBULATORY_CARE_PROVIDER_SITE_OTHER): Payer: Medicaid Other | Admitting: Orthopaedic Surgery

## 2024-01-31 DIAGNOSIS — Z96651 Presence of right artificial knee joint: Secondary | ICD-10-CM

## 2024-01-31 MED ORDER — METHOCARBAMOL 750 MG PO TABS: 750.0000 mg | ORAL_TABLET | Freq: Four times a day (QID) | ORAL | 0 refills | Status: DC | PRN

## 2024-01-31 MED ORDER — HYDROMORPHONE HCL 4 MG PO TABS: 4.0000 mg | ORAL_TABLET | ORAL | 0 refills | Status: AC | PRN

## 2024-01-31 NOTE — Progress Notes (Signed)
 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  He does ambulate with a cane.  He just had lumbar spine surgery with some hardware removal last week.  He said the knee is doing well.  On exam his extension is almost full and his flexion is almost full of the right knee.  The swelling is minimal and he feels ligamentously stable.  2 views of the right knee show well-seated total knee arthroplasty with no complicating features.  He will continue to increase his activities with his knee as comfort allows.  He does not need any more therapy on his knee from my standpoint except for home exercise program.  Will see him back in 6 months with a final AP and lateral of the right knee.  If there are issues with the knee before then he knows to let us  know.

## 2024-02-02 ENCOUNTER — Encounter: Payer: Self-pay | Admitting: Orthopedic Surgery

## 2024-02-02 ENCOUNTER — Encounter: Payer: Medicaid Other | Attending: Physical Medicine & Rehabilitation | Admitting: Physical Medicine & Rehabilitation

## 2024-02-02 ENCOUNTER — Other Ambulatory Visit: Payer: Self-pay | Admitting: Physician Assistant

## 2024-02-02 ENCOUNTER — Telehealth: Payer: Self-pay | Admitting: Physician Assistant

## 2024-02-02 ENCOUNTER — Encounter: Payer: Self-pay | Admitting: Physical Medicine & Rehabilitation

## 2024-02-02 VITALS — BP 143/91 | HR 67 | Ht 72.0 in | Wt 191.0 lb

## 2024-02-02 DIAGNOSIS — Z5181 Encounter for therapeutic drug level monitoring: Secondary | ICD-10-CM | POA: Diagnosis not present

## 2024-02-02 DIAGNOSIS — M25561 Pain in right knee: Secondary | ICD-10-CM | POA: Diagnosis not present

## 2024-02-02 DIAGNOSIS — Z96651 Presence of right artificial knee joint: Secondary | ICD-10-CM | POA: Insufficient documentation

## 2024-02-02 DIAGNOSIS — Z96659 Presence of unspecified artificial knee joint: Secondary | ICD-10-CM | POA: Insufficient documentation

## 2024-02-02 DIAGNOSIS — M25569 Pain in unspecified knee: Secondary | ICD-10-CM | POA: Insufficient documentation

## 2024-02-02 DIAGNOSIS — G8929 Other chronic pain: Secondary | ICD-10-CM | POA: Insufficient documentation

## 2024-02-02 DIAGNOSIS — Z79899 Other long term (current) drug therapy: Secondary | ICD-10-CM | POA: Insufficient documentation

## 2024-02-02 DIAGNOSIS — M545 Low back pain, unspecified: Secondary | ICD-10-CM | POA: Insufficient documentation

## 2024-02-02 DIAGNOSIS — Z7689 Persons encountering health services in other specified circumstances: Secondary | ICD-10-CM | POA: Diagnosis not present

## 2024-02-02 DIAGNOSIS — G894 Chronic pain syndrome: Secondary | ICD-10-CM | POA: Diagnosis not present

## 2024-02-02 MED ORDER — NALOXONE HCL 4 MG/0.1ML NA LIQD: NASAL | 0 refills | Status: AC

## 2024-02-02 MED ORDER — CEPHALEXIN 500 MG PO CAPS: 500.0000 mg | ORAL_CAPSULE | Freq: Four times a day (QID) | ORAL | 0 refills | Status: DC
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
## 2024-02-02 NOTE — Telephone Encounter (Signed)
 I spoke with the patient today.  He is not quite 2 weeks status post removal of instrumentation with Dr. Georgina.  He went to his pain management doctor today and noticed he was having more leakage from the wound.  Has not had any fever, chills, increased pain.  They did change the dressing at the pain management office did not see any sign of infection per what they told him.  Describes the drainage is clear with a little blood in it.  But definitely more than he was having.  No purulence per description.  He is coming in for his postop on Monday.  I will just for precaution call him in some Keflex  for over the weekend.  I did tell him if he has any increasing pain any fever chills or has change of the drainage to more purulent or a significant increase in drainage he is to contact our physician on-call or go to the emergency room.  I did offer for him to come in today and have me check this however he lives quite a ways away and was in town earlier today but does not want to return to town.  Patient understood

## 2024-02-04 ENCOUNTER — Encounter: Payer: Self-pay | Admitting: Oncology

## 2024-02-05 ENCOUNTER — Encounter: Payer: 59 | Admitting: Orthopedic Surgery

## 2024-02-06 LAB — TOXASSURE SELECT,+ANTIDEPR,UR

## 2024-02-07 ENCOUNTER — Ambulatory Visit (INDEPENDENT_AMBULATORY_CARE_PROVIDER_SITE_OTHER): Payer: Medicaid Other | Admitting: Orthopedic Surgery

## 2024-02-07 ENCOUNTER — Other Ambulatory Visit (INDEPENDENT_AMBULATORY_CARE_PROVIDER_SITE_OTHER): Payer: Self-pay

## 2024-02-07 DIAGNOSIS — Z981 Arthrodesis status: Secondary | ICD-10-CM | POA: Diagnosis not present

## 2024-02-07 DIAGNOSIS — Z7689 Persons encountering health services in other specified circumstances: Secondary | ICD-10-CM | POA: Diagnosis not present

## 2024-02-07 DIAGNOSIS — Z9889 Other specified postprocedural states: Secondary | ICD-10-CM

## 2024-02-07 NOTE — Progress Notes (Addendum)
Orthopedic Surgery Post-operative Office Visit  Procedure: removal of posterior instrumentation at L5 and S1 Date of Surgery: 01/23/2024 (~2 weeks post-op)  Assessment: Patient is a 58 y.o. who has noticed swelling around his lumbar incision. Initially had drainage but has not had drainage in the last few days   Plan: -Operative plans complete -Out of bed as tolerated, no brace -No spine specific precautions -Pain management: dilaudid. He is in pain management, will transition him to their management at six weeks post-op -Recommended compression to the area of swelling and icing as able -Instructed him to call the office if he develops any further drainage, redness around his incisions, fevers or chills -Return to office in 4 weeks, lumbar x-rays needed at next visit: none  ___________________________________________________________________________   Subjective: Patient is still having significant back pain.  He has not had any return of his radiating leg pain.  He is established with pain management.  After surgery, he noticed some drainage but has not noticed any for the last 4 to 5 days.  He has noticed swelling around the incision.  Has not noticed any redness around the incision.  Objective:  General: no acute distress, appropriate affect Neurologic: alert, answering questions appropriately, following commands Respiratory: unlabored breathing on room air Skin: Focal swelling in the area of the incision consistent with either hematoma or seroma, no active or expressible drainage, no erythema  MSK (spine):  -Strength exam      Left  Right  EHL    5/5  5/5 TA    5/5  5/5 GSC    5/5  5/5 Knee extension  5/5  5/5 Hip flexion   5/5  5/5  -Sensory exam    Sensation intact to light touch in L3-S1 nerve distributions of bilateral lower extremities  Imaging: X-rays of the lumbar spine from 02/07/2024 were independently reviewed and interpreted, showing interval removal of the  L5 and S1 posterior instrumentation.  Interbody device seen at L5/S1 that appears in similar position to prior films.  No lucency seen around the interbody device or the integrated fixation.  Chronic compression fracture with cement augmentation seen at L1.  No other fracture seen.   Patient name: Aurther Harlin Patient MRN: [id number] Date of visit: 02/07/24

## 2024-02-20 ENCOUNTER — Encounter: Payer: Self-pay | Admitting: Orthopedic Surgery

## 2024-02-20 MED ORDER — HYDROMORPHONE HCL 4 MG PO TABS: 4.0000 mg | ORAL_TABLET | ORAL | 0 refills | Status: AC | PRN

## 2024-02-24 DIAGNOSIS — Z419 Encounter for procedure for purposes other than remedying health state, unspecified: Secondary | ICD-10-CM | POA: Diagnosis not present

## 2024-02-28 ENCOUNTER — Encounter: Payer: Self-pay | Admitting: Oncology

## 2024-02-29 ENCOUNTER — Encounter: Payer: Self-pay | Admitting: Internal Medicine

## 2024-02-29 NOTE — Telephone Encounter (Signed)
 [email or web address]

## 2024-03-01 ENCOUNTER — Ambulatory Visit (INDEPENDENT_AMBULATORY_CARE_PROVIDER_SITE_OTHER): Admitting: Internal Medicine

## 2024-03-01 ENCOUNTER — Other Ambulatory Visit: Payer: Self-pay

## 2024-03-01 ENCOUNTER — Encounter: Payer: Self-pay | Admitting: Internal Medicine

## 2024-03-01 ENCOUNTER — Other Ambulatory Visit: Payer: Self-pay | Admitting: Internal Medicine

## 2024-03-01 VITALS — BP 136/80 | HR 69 | Temp 97.9°F | Ht 70.0 in | Wt 198.0 lb

## 2024-03-01 DIAGNOSIS — Z7689 Persons encountering health services in other specified circumstances: Secondary | ICD-10-CM | POA: Diagnosis not present

## 2024-03-01 DIAGNOSIS — M6250 Muscle wasting and atrophy, not elsewhere classified, unspecified site: Secondary | ICD-10-CM

## 2024-03-01 LAB — TESTOSTERONE: Testosterone: 280.35 ng/dL — ABNORMAL LOW (ref 300.00–890.00)

## 2024-03-01 MED ORDER — BD DISP NEEDLES 18G X 1-1/2" MISC: 2.0000 | 6 refills | Status: AC

## 2024-03-01 MED ORDER — SYRINGE (DISPOSABLE) 3 ML MISC: 1.0000 | 6 refills | Status: AC

## 2024-03-01 MED ORDER — TESTOSTERONE CYPIONATE 100 MG/ML IM SOLN: 200.0000 mg | INTRAMUSCULAR | 3 refills | Status: DC
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
## 2024-03-01 NOTE — Progress Notes (Signed)
 Subjective:    Patient ID: Harold Smith, male    DOB: Oct 11, 1966, 58 y.o.   MRN: [id number]  HPI Here due to loss of muscle and concern about his testosterone  He notes muscle loss and weakness Still with at least moderate pain --uses diluadid from ortho occasionally Still limited in activities---most he can do is still bathing Not able to walk any significant distance--like just to mailbox Daughter does shopping---or he goes using scooter  Rare intercourse lately Is able to maintain erection to ejaculation (but ejaculation is not normal)  Current Outpatient Medications on File Prior to Visit  Medication Sig Dispense Refill   alendronate (FOSAMAX) 70 MG tablet Take 1 tablet (70 mg total) by mouth once a week. Take with a full glass of water on an empty stomach. (Patient taking differently: Take 70 mg by mouth every Monday. Take with a full glass of water on an empty stomach.) 13 tablet 3   baclofen (LIORESAL) 10 MG tablet Take 1 tablet (10 mg total) by mouth 3 (three) times daily as needed for muscle spasms. 90 tablet 1   calcitRIOL (ROCALTROL) 0.25 MCG capsule Take 1 capsule (0.25 mcg total) by mouth daily. 90 capsule 2   CALCIUM PO Take 1 tablet by mouth daily.     cephALEXin (KEFLEX) 500 MG capsule Take 1 capsule (500 mg total) by mouth 4 (four) times daily. 20 capsule 0   cetirizine (ZYRTEC) 10 MG tablet Take 1 tablet (10 mg total) by mouth daily. 90 tablet 3   Cyanocobalamin (VITAMIN B-12 PO) Take 1 tablet by mouth daily.     dicyclomine (BENTYL) 10 MG capsule Take 1 to 2 capsules as needed for abdominal cramps and diarrhea every 6 to 8 hours 240 capsule 3   DULoxetine (CYMBALTA) 60 MG capsule TAKE 1 CAPSULE BY MOUTH ONCE DAILY 90 capsule 3   ferrous sulfate (FEROSUL) 325 (65 FE) MG tablet TAKE ONE TABLET EACH MORNING WITH BREAKFAST 30 tablet 11   fluticasone (FLONASE) 50 MCG/ACT nasal spray Place 2 sprays into both nostrils daily. (Patient taking differently: Place 2 sprays  into both nostrils daily as needed for allergies or rhinitis.) 48 g 3   gabapentin (NEURONTIN) 300 MG capsule TAKE 1 CAPSULE BY MOUTH TWICE DAILY AND TAKE 4 CAPSULES AT BEDTIME 180 capsule 11   hydrochlorothiazide (HYDRODIURIL) 25 MG tablet TAKE 1 TABLET BY MOUTH DAILY 90 tablet 3   lipase/protease/amylase (CREON) 36000 UNITS CPEP capsule Take 2 capsules (72,000 Units total) by mouth 3 (three) times daily with meals. May also take 1 capsule (36,000 Units total) as needed (with snacks). 240 capsule 5   lisinopril (ZESTRIL) 20 MG tablet TAKE 1 TABLET BY MOUTH DAILY 90 tablet 3   meloxicam (MOBIC) 15 MG tablet TAKE 1 TABLET BY MOUTH DAILY AS NEEDED FOR PAIN (Patient taking differently: Take 15 mg by mouth daily. for pain) 90 tablet 1   methocarbamol (ROBAXIN-750) 750 MG tablet Take 1 tablet (750 mg total) by mouth every 6 (six) hours as needed (pain, muscle spasms). 80 tablet 0   Multiple Vitamin (MULTIVITAMIN) tablet Take 1 tablet by mouth daily.     naloxone (NARCAN) nasal spray 4 mg/0.1 mL In the event of opiod overdose 1 each 0   omeprazole (PRILOSEC) 40 MG capsule Take 1 capsule (40 mg total) by mouth 2 (two) times daily before a meal. 180 capsule 3   QUEtiapine (SEROQUEL) 25 MG tablet Take 1-2 tablets (25-50 mg total) by mouth at bedtime as  needed. for sleep 60 tablet 1   Sodium Fluoride (PREVIDENT 5000 BOOSTER PLUS DT) Place 1 application  onto teeth 2 (two) times daily.     traZODone (DESYREL) 50 MG tablet Take 1-2 tablets (50-100 mg total) by mouth at bedtime as needed for sleep. 180 tablet 1   Vitamin D, Ergocalciferol, (DRISDOL) 1.25 MG (50000 UNIT) CAPS capsule Take 1 capsule (50,000 Units total) by mouth every 7 (seven) days. (Patient taking differently: Take 50,000 Units by mouth every Monday.) 13 capsule 2   No current facility-administered medications on file prior to visit.    No Known Allergies  Past Medical History:  Diagnosis Date   Allergic rhinitis due to pollen    Anemia     Anxiety    Chronic pain    Colon polyps    Depression    Diverticulitis large intestine    Erosive esophagitis    GERD (gastroesophageal reflux disease)    HTN (hypertension)    Irritable bowel syndrome    Metabolic bone disease 09/01/2022   Obesity    Osteoarthritis, knee    Sleep apnea    does not use cpap, pt states he no longer needed CPAP due to weight loss   Sleep disturbance    Vitamin D deficiency 09/01/2022    Past Surgical History:  Procedure Laterality Date   ABDOMINAL EXPOSURE N/A 03/06/2023   Procedure: ABDOMINAL EXPOSURE;  Surgeon: Cephus Shelling, MD;  Location: [facility] OR;  Service: Vascular;  Laterality: N/A;   ACHILLES TENDON REPAIR Right 12/26/2006   ANTERIOR LUMBAR FUSION N/A 03/06/2023   Procedure: L5-S1 ANTERIOR LUMBAR FUSION 1 LEVEL;  Surgeon: London Sheer, MD;  Location: [facility] OR;  Service: Orthopedics;  Laterality: N/A;   BIOPSY  08/29/2023   Procedure: BIOPSY;  Surgeon: Toney Reil, MD;  Location: [facility] ENDOSCOPY;  Service: Gastroenterology;;   CATARACT EXTRACTION W/PHACO Left 07/20/2021   Procedure: CATARACT EXTRACTION PHACO AND INTRAOCULAR LENS PLACEMENT (IOC) LEFT 2.11 00:24.0;  Surgeon: Galen Manila, MD;  Location: [facility] SURGERY CNTR;  Service: Ophthalmology;  Laterality: Left;  sleep apnea   CATARACT EXTRACTION W/PHACO Right 08/03/2021   Procedure: CATARACT EXTRACTION PHACO AND INTRAOCULAR LENS PLACEMENT (IOC) RIGHT;  Surgeon: Galen Manila, MD;  Location: [facility] SURGERY CNTR;  Service: Ophthalmology;  Laterality: Right;  3.19 0:33.0   CHOLECYSTECTOMY     COLON RESECTION Left 12/26/2004   due to diverticular disease at [facility]   COLONOSCOPY  2013?   COLONOSCOPY N/A 11/30/2022   Procedure: COLONOSCOPY;  Surgeon: Toney Reil, MD;  Location: [facility] ENDOSCOPY;  Service: Gastroenterology;  Laterality: N/A;   COLONOSCOPY WITH PROPOFOL N/A 05/22/2018   Procedure: COLONOSCOPY WITH PROPOFOL;  Surgeon: Toney Reil, MD;   Location: [facility] ENDOSCOPY;  Service: Gastroenterology;  Laterality: N/A;   COLONOSCOPY WITH PROPOFOL N/A 10/22/2020   Procedure: COLONOSCOPY WITH PROPOFOL;  Surgeon: Toney Reil, MD;  Location: [facility] SURGERY CNTR;  Service: Endoscopy;  Laterality: N/A;  priority 4   COLONOSCOPY WITH PROPOFOL N/A 11/10/2021   Procedure: COLONOSCOPY WITH PROPOFOL;  Surgeon: Toney Reil, MD;  Location: [facility] ENDOSCOPY;  Service: Gastroenterology;  Laterality: N/A;   COLONOSCOPY WITH PROPOFOL N/A 06/16/2022   Procedure: COLONOSCOPY WITH PROPOFOL;  Surgeon: Toney Reil, MD;  Location: [facility] SURGERY CNTR;  Service: Endoscopy;  Laterality: N/A;   COLONOSCOPY WITH PROPOFOL N/A 11/29/2022   Procedure: COLONOSCOPY WITH PROPOFOL;  Surgeon: Toney Reil, MD;  Location: [facility] ENDOSCOPY;  Service: Gastroenterology;  Laterality: N/A;   DRUG INDUCED  ENDOSCOPY N/A 06/10/2019   Procedure: DRUG INDUCED SLEEP ENDOSCOPY;  Surgeon: Osborn Coho, MD;  Location: [facility];  Service: ENT;  Laterality: N/A;   ESOPHAGOGASTRODUODENOSCOPY (EGD) WITH PROPOFOL N/A 06/16/2022   Procedure: ESOPHAGOGASTRODUODENOSCOPY (EGD) WITH PROPOFOL;  Surgeon: Toney Reil, MD;  Location: [facility] SURGERY CNTR;  Service: Endoscopy;  Laterality: N/A;   ESOPHAGOGASTRODUODENOSCOPY (EGD) WITH PROPOFOL N/A 05/24/2023   Procedure: ESOPHAGOGASTRODUODENOSCOPY (EGD) WITH PROPOFOL;  Surgeon: Toney Reil, MD;  Location: [facility] ENDOSCOPY;  Service: Gastroenterology;  Laterality: N/A;   ESOPHAGOGASTRODUODENOSCOPY (EGD) WITH PROPOFOL N/A 08/29/2023   Procedure: ESOPHAGOGASTRODUODENOSCOPY (EGD) WITH PROPOFOL;  Surgeon: Toney Reil, MD;  Location: [facility] ENDOSCOPY;  Service: Gastroenterology;  Laterality: N/A;   GASTRIC BYPASS N/A 12/27/2007   HARDWARE REMOVAL Right 06/22/2023   Procedure: HARDWARE REMOVAL;  Surgeon: Myrene Galas, MD;  Location: [facility] OR;  Service: Orthopedics;  Laterality: Right;   HARDWARE  REMOVAL N/A 01/23/2024   Procedure: REMOVAL OF POSTERIOR SPINAL INSTRUMENTATION;  Surgeon: London Sheer, MD;  Location: [facility] OR;  Service: Orthopedics;  Laterality: N/A;   HIATAL HERNIA REPAIR  09/25/2014   at [facility]   KNEE ARTHROSCOPY Right 06/22/2023   Procedure: ARTHROSCOPY KNEE;  Surgeon: Myrene Galas, MD;  Location: [facility] OR;  Service: Orthopedics;  Laterality: Right;   ORIF FEMUR FRACTURE Right 08/30/2022   Procedure: OPEN REDUCTION INTERNAL FIXATION (ORIF) DISTAL FEMUR FRACTURE;  Surgeon: Myrene Galas, MD;  Location: [facility] OR;  Service: Orthopedics;  Laterality: Right;   POLYPECTOMY  10/22/2020   Procedure: POLYPECTOMY;  Surgeon: Toney Reil, MD;  Location: [facility] SURGERY CNTR;  Service: Endoscopy;;   POLYPECTOMY  06/16/2022   Procedure: POLYPECTOMY;  Surgeon: Toney Reil, MD;  Location: [facility] SURGERY CNTR;  Service: Endoscopy;;   ROTATOR CUFF REPAIR Right    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   TOTAL KNEE ARTHROPLASTY Right 09/19/2023   Procedure: RIGHT TOTAL KNEE ARTHROPLASTY;  Surgeon: Kathryne Hitch, MD;  Location: [facility] OR;  Service: Orthopedics;  Laterality: Right;    Family History  Problem Relation Age of Onset   Hypertension Mother    Diabetes Mother    Kidney disease Mother    Heart disease Mother    Colon cancer Father 23   Asthma Sister    Obesity Sister    Stroke Sister    Obesity Brother    Diabetes Other    Heart disease Other    Hypertension Other    Kidney disease Other    Cancer Paternal Uncle        unk type, possible prostate   Lung cancer Paternal Grandmother     Social History   Socioeconomic History   Marital status: Single    Spouse name: Not on file   Number of children: 3   Years of education: Not on file   Highest education level: Some college, no degree  Occupational History   Occupation: Hydrographic surveyor  Tobacco Use   Smoking status: Former    Types: Gaffer exposure: Past    Smokeless tobacco: Never   Tobacco comments:    occasional cigar  Vaping Use   Vaping status: Never Used  Substance and Sexual Activity   Alcohol use: Yes    Comment: Occasionally   Drug use: No   Sexual activity: Yes  Other Topics Concern   Not on file  Social History Narrative   Not on file   Social Drivers of Health   Financial  Resource Strain: Medium Risk (01/04/2024)   Overall Financial Resource Strain (CARDIA)    Difficulty of Paying Living Expenses: Somewhat hard  Food Insecurity: Food Insecurity Present (01/04/2024)   Hunger Vital Sign    Worried About Running Out of Food in the Last Year: Sometimes true    Ran Out of Food in the Last Year: Sometimes true  Transportation Needs: No Transportation Needs (01/04/2024)   PRAPARE - Administrator, Civil Service (Medical): No    Lack of Transportation (Non-Medical): No  Physical Activity: Unknown (01/04/2024)   Exercise Vital Sign    Days of Exercise per Week: 0 days    Minutes of Exercise per Session: Not on file  Stress: Stress Concern Present (01/04/2024)   [employer] of Occupational Health - Occupational Stress Questionnaire    Feeling of Stress : Very much  Social Connections: Moderately Integrated (01/04/2024)   Social Connection and Isolation Panel [NHANES]    Frequency of Communication with Friends and Family: Twice a week    Frequency of Social Gatherings with Friends and Family: Twice a week    Attends Religious Services: 1 to 4 times per year    Active Member of [employer] or Organizations: No    Attends Banker Meetings: 1 to 4 times per year    Marital Status: Divorced  Catering manager Violence: Not At Risk (03/06/2023)   Humiliation, Afraid, Rape, and Kick questionnaire    Fear of Current or Ex-Partner: No    Emotionally Abused: No    Physically Abused: No    Sexually Abused: No   Review of Systems     Objective:   Physical Exam Constitutional:      General: He is not in acute  distress. Neurological:     Mental Status: He is alert.  Psychiatric:        Mood and Affect: Mood normal.            Assessment & Plan:

## 2024-03-04 ENCOUNTER — Encounter: Payer: Self-pay | Admitting: Oncology

## 2024-03-04 MED ORDER — FLUTICASONE PROPIONATE 50 MCG/ACT NA SUSP: 2.0000 | Freq: Every day | NASAL | 11 refills | Status: DC | PRN

## 2024-03-04 MED ORDER — CETIRIZINE HCL 10 MG PO TABS: 10.0000 mg | ORAL_TABLET | Freq: Every day | ORAL | 3 refills | Status: DC

## 2024-03-05 ENCOUNTER — Ambulatory Visit

## 2024-03-06 ENCOUNTER — Ambulatory Visit: Payer: Medicaid Other | Admitting: Orthopedic Surgery

## 2024-03-06 DIAGNOSIS — Z981 Arthrodesis status: Secondary | ICD-10-CM

## 2024-03-06 DIAGNOSIS — Z7689 Persons encountering health services in other specified circumstances: Secondary | ICD-10-CM | POA: Diagnosis not present

## 2024-03-06 MED ORDER — METHOCARBAMOL 500 MG PO TABS: 500.0000 mg | ORAL_TABLET | Freq: Three times a day (TID) | ORAL | 0 refills | Status: DC | PRN

## 2024-03-06 MED ORDER — HYDROMORPHONE HCL 4 MG PO TABS: 4.0000 mg | ORAL_TABLET | Freq: Four times a day (QID) | ORAL | 0 refills | Status: AC | PRN

## 2024-03-06 MED ORDER — HYPODERMIC NEEDLE 22G X 1-1/2" MISC: 1.0000 | 12 refills | Status: AC

## 2024-03-06 NOTE — Telephone Encounter (Signed)
 I spoke with Morrie Sheldon RN team lead and Audria Nine NP; Testosterone comes in 200 mg/ ml so pt would only inject 1 ml of med and  usually use 22G 1 1/2" needle for admin of testosterone. Morrie Sheldon and [facility] said would be OK to review with Dr Alphonsus Sias on his return next wk.I spoke with Heather at [facility] [address] and pt has not picked up the testosterone or syringes. I spoke with pt and he rescheduled NV for 03/13/24 at 9:30 to be taught how to admin testosterone injection. Pt request cb when pt can pick up med and syringes. Sending note toDr Alphonsus Sias who is out of office this week.

## 2024-03-06 NOTE — Telephone Encounter (Signed)
 Please send new rx for 200 mg/ml instead of the 100 mg/ml that was sent in. Thank you.

## 2024-03-06 NOTE — Addendum Note (Signed)
 Addended by: Eual Fines on: 03/06/2024 03:12 PM   Modules accepted: Orders

## 2024-03-06 NOTE — Progress Notes (Signed)
 Orthopedic Surgery Post-operative Office Visit   Procedure: removal of posterior instrumentation at L5 and S1 Date of Surgery: 01/23/2024 (~6 weeks post-op)   Assessment: Patient is a 58 y.o. who is still having significant low back pain and right knee pain.  No radicular symptoms.  No drainage and wound appears well-healed over the posterior lumbar spine     Plan: -Operative plans complete -Out of bed as tolerated, no brace -No spine specific precautions -Prescribed methocarbamol today to help him with pain until he sees pain management next week -Pain management: transition to pain management for his chronic knee and back pain -Return to office in 1 year which would be 2 years from his index surgery   ___________________________________________________________________________     Subjective: Patient has not had any radiating leg pain since he was seen last time.  He has not noticed any drainage or redness around his incision.  He still has some tenderness over the proximal aspect of the incision but it has been getting better.  He also is having chronic right knee pain.  He is established with pain management and is set to see them in about a week.   Objective:   General: no acute distress, appropriate affect Neurologic: alert, answering questions appropriately, following commands Respiratory: unlabored breathing on room air Skin: Swelling around the lumbar incision has improved significantly since he was last seen, there is still a small area with swelling at the proximal aspect of the incision.  No active or expressible drainage seen.  No erythema seen.   MSK (spine):   -Strength exam                                                   Left                  Right   EHL                              5/5                  5/5 TA                                 5/5                  5/5 GSC                             5/5                  5/5 Knee extension            5/5                   5/5 Hip flexion                    5/5                  5/5   -Sensory exam                           Sensation intact to light touch  in L2-S1 nerve distributions of bilateral lower extremities   Imaging: None obtained today     Patient name: Harold Smith Patient MRN: [id number] Date of visit: 03/06/24

## 2024-03-07 ENCOUNTER — Ambulatory Visit

## 2024-03-07 MED ORDER — TESTOSTERONE CYPIONATE 200 MG/ML IM SOLN: 200.0000 mg | INTRAMUSCULAR | 0 refills | Status: DC

## 2024-03-07 NOTE — Addendum Note (Signed)
 Addended by: Tillman Abide I on: 03/07/2024 11:25 AM   Modules accepted: Orders

## 2024-03-13 ENCOUNTER — Ambulatory Visit (INDEPENDENT_AMBULATORY_CARE_PROVIDER_SITE_OTHER)

## 2024-03-13 DIAGNOSIS — E291 Testicular hypofunction: Secondary | ICD-10-CM | POA: Diagnosis not present

## 2024-03-13 DIAGNOSIS — Z7689 Persons encountering health services in other specified circumstances: Secondary | ICD-10-CM | POA: Diagnosis not present

## 2024-03-13 MED ORDER — TESTOSTERONE CYPIONATE 200 MG/ML IM SOLN
200.0000 mg | Freq: Once | INTRAMUSCULAR | Status: AC
  Administered 2024-03-13: 200 mg via INTRAMUSCULAR

## 2024-03-13 NOTE — Progress Notes (Signed)
 Patient in office today for teaching on self administration of testosterone injection 200 mg IM. Patient has brought medication with them to appointment. Printed off Instructions from [employer]. Patient reviewed instruction video on manufactures website Reviewed all instructions with patient and had repeat back to me. Instructed patient on proper cleaning of injection site and any signs of infection. Patient was able to administer medication with very little directions. Patient felt comfortable continuing in home setting. Reviewed with patient proper disposal of syringe ans needle after use. Will call the office if any questions.

## 2024-03-15 ENCOUNTER — Encounter: Payer: Self-pay | Admitting: Physical Medicine & Rehabilitation

## 2024-03-15 ENCOUNTER — Encounter: Payer: Self-pay | Admitting: Internal Medicine

## 2024-03-15 ENCOUNTER — Encounter: Payer: Medicaid Other | Attending: Physical Medicine & Rehabilitation | Admitting: Physical Medicine & Rehabilitation

## 2024-03-15 VITALS — BP 142/97 | HR 69 | Ht 70.0 in | Wt 192.0 lb

## 2024-03-15 DIAGNOSIS — Z96659 Presence of unspecified artificial knee joint: Secondary | ICD-10-CM | POA: Diagnosis not present

## 2024-03-15 DIAGNOSIS — Z79899 Other long term (current) drug therapy: Secondary | ICD-10-CM | POA: Diagnosis not present

## 2024-03-15 DIAGNOSIS — G8929 Other chronic pain: Secondary | ICD-10-CM | POA: Insufficient documentation

## 2024-03-15 DIAGNOSIS — M25569 Pain in unspecified knee: Secondary | ICD-10-CM | POA: Diagnosis present

## 2024-03-15 DIAGNOSIS — G894 Chronic pain syndrome: Secondary | ICD-10-CM

## 2024-03-15 DIAGNOSIS — Z5181 Encounter for therapeutic drug level monitoring: Secondary | ICD-10-CM | POA: Diagnosis not present

## 2024-03-15 DIAGNOSIS — M545 Low back pain, unspecified: Secondary | ICD-10-CM | POA: Diagnosis present

## 2024-03-15 DIAGNOSIS — Z7689 Persons encountering health services in other specified circumstances: Secondary | ICD-10-CM | POA: Diagnosis not present

## 2024-03-15 MED ORDER — OXYCODONE-ACETAMINOPHEN 5-325 MG PO TABS
1.0000 | ORAL_TABLET | Freq: Three times a day (TID) | ORAL | 0 refills | Status: DC | PRN
Start: 2024-03-15 — End: 2024-04-08
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
## 2024-03-27 ENCOUNTER — Telehealth: Payer: Self-pay | Admitting: Physical Medicine & Rehabilitation

## 2024-03-27 NOTE — Telephone Encounter (Signed)
Patient called in states

## 2024-03-27 NOTE — Telephone Encounter (Signed)
 Patient called in states he has been unable to get his zynex equipment due to them needing a body map , patient requesting a call back

## 2024-03-28 NOTE — Telephone Encounter (Signed)
 Mr Haack does not have the form he says the company has been faxing it to our office and reached out and had not gotten a response.

## 2024-03-30 ENCOUNTER — Other Ambulatory Visit: Payer: Self-pay | Admitting: Internal Medicine

## 2024-04-01 NOTE — Telephone Encounter (Signed)
 Last written 01-05-24 #60 Last OV 03-01-24 Next OV 07-15-24 [facility] [address]

## 2024-04-04 ENCOUNTER — Encounter: Payer: Self-pay | Admitting: Physical Medicine & Rehabilitation

## 2024-04-06 DIAGNOSIS — Z419 Encounter for procedure for purposes other than remedying health state, unspecified: Secondary | ICD-10-CM | POA: Diagnosis not present

## 2024-04-08 MED ORDER — OXYCODONE-ACETAMINOPHEN 5-325 MG PO TABS: 1.0000 | ORAL_TABLET | Freq: Four times a day (QID) | ORAL | 0 refills | Status: DC | PRN

## 2024-04-11 ENCOUNTER — Encounter: Payer: Self-pay | Admitting: Physical Medicine & Rehabilitation

## 2024-04-11 ENCOUNTER — Encounter: Attending: Physical Medicine & Rehabilitation | Admitting: Physical Medicine & Rehabilitation

## 2024-04-11 VITALS — BP 174/108 | HR 61 | Ht 70.0 in | Wt 188.0 lb

## 2024-04-11 DIAGNOSIS — Z79899 Other long term (current) drug therapy: Secondary | ICD-10-CM | POA: Diagnosis present

## 2024-04-11 DIAGNOSIS — M25569 Pain in unspecified knee: Secondary | ICD-10-CM | POA: Insufficient documentation

## 2024-04-11 DIAGNOSIS — M545 Low back pain, unspecified: Secondary | ICD-10-CM | POA: Insufficient documentation

## 2024-04-11 DIAGNOSIS — G8929 Other chronic pain: Secondary | ICD-10-CM | POA: Insufficient documentation

## 2024-04-11 DIAGNOSIS — G894 Chronic pain syndrome: Secondary | ICD-10-CM | POA: Insufficient documentation

## 2024-04-11 DIAGNOSIS — Z96659 Presence of unspecified artificial knee joint: Secondary | ICD-10-CM | POA: Insufficient documentation

## 2024-04-11 MED ORDER — OXYCODONE-ACETAMINOPHEN 7.5-325 MG PO TABS: 1.0000 | ORAL_TABLET | Freq: Four times a day (QID) | ORAL | 0 refills | Status: DC | PRN

## 2024-04-11 MED ORDER — OXYCODONE-ACETAMINOPHEN 7.5-325 MG PO TABS: 1.0000 | ORAL_TABLET | ORAL | 0 refills | Status: DC | PRN
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
## 2024-04-15 ENCOUNTER — Encounter: Payer: Self-pay | Admitting: *Deleted

## 2024-04-15 ENCOUNTER — Telehealth: Payer: Self-pay | Admitting: *Deleted

## 2024-04-15 NOTE — Telephone Encounter (Signed)
 Outcome Approved today by [facility] Medicaid 2017 Approved. This drug has been approved. Approved quantity: 120 tablets per 30 day(s). You may fill up to a 34 day supply at a retail pharmacy. You may fill up to a 90 day supply for maintenance drugs, please refer to the formulary for details. Please call the pharmacy to process your prescription claim. Effective Date: 04/15/2024 Authorization Expiration Date: 10/12/2024

## 2024-04-15 NOTE — Telephone Encounter (Signed)
 Prior auth for oxycodone  7.5/325 submitted to [facility] Medicare via CoverMyMeds  Ephraim Hash (Key: [facility])

## 2024-04-22 ENCOUNTER — Encounter: Payer: Self-pay | Admitting: Oncology

## 2024-04-22 ENCOUNTER — Ambulatory Visit (INDEPENDENT_AMBULATORY_CARE_PROVIDER_SITE_OTHER): Payer: 59 | Admitting: Internal Medicine

## 2024-04-22 ENCOUNTER — Encounter: Payer: Self-pay | Admitting: Internal Medicine

## 2024-04-22 VITALS — BP 160/94 | HR 105 | Ht 70.0 in | Wt 205.0 lb

## 2024-04-22 DIAGNOSIS — M810 Age-related osteoporosis without current pathological fracture: Secondary | ICD-10-CM

## 2024-04-22 DIAGNOSIS — E559 Vitamin D deficiency, unspecified: Secondary | ICD-10-CM | POA: Diagnosis not present

## 2024-04-22 DIAGNOSIS — E211 Secondary hyperparathyroidism, not elsewhere classified: Secondary | ICD-10-CM | POA: Diagnosis not present

## 2024-04-22 MED ORDER — ALENDRONATE SODIUM 70 MG PO TABS
70.0000 mg | ORAL_TABLET | ORAL | 3 refills | Status: DC
Start: 2024-04-22 — End: 2024-07-17

## 2024-04-22 MED ORDER — VITAMIN D (ERGOCALCIFEROL) 1.25 MG (50000 UNIT) PO CAPS: 50000.0000 [IU] | ORAL_CAPSULE | ORAL | 3 refills | Status: DC

## 2024-04-22 NOTE — Progress Notes (Unsigned)
 Name: Harold Smith  MRN/ DOB: [id number], 05/26/1966    Age/ Sex: 58 y.o., male    PCP: Helaine Llanos, MD   Reason for Endocrinology Evaluation: Osteoporosis     Date of Initial Endocrinology Evaluation: 10/23/2023    HPI: Harold Smith is a 58 y.o. male with a past medical history of HTN, iron  deficiency anemia and chronic pain syndrome, Hx Roux-en-Y in 2009. The patient presented for initial endocrinology clinic visit on 10/23/2023 for consultative assistance with his osteoporosis.   Pt was diagnosed with osteoporosis:10/2022 with a T-score -3.1 at the right femoral neck  Fracture Hx: L1 compression fracture,right femur ( fell down the steps )  FH of osteoporosis or hip fracture:  n/a Prior Hx of anti-resorptive therapy : Alendronate  started 12/2022, but has been out for months   Of note, the patient has a history of Roux-en-Y in 2015  No tobacco  Occasional ETOH  On his initial visit to our clinic he had a low vitamin D  at 12.11, patient was supposed to be on 5000 units of vitamin D3, this was switched to ergocalciferol .  TFTs, CMP, phosphorus were normal.  PTH was elevated secondary to vitamin D  deficiency  SUBJECTIVE:    Today (04/22/24): Harold Smith is here for follow-up on osteoporosis.   Since his last visit here, he underwent removal of a lumbar posterior spinal instrumentation 12/2023 He completed physical therapy for the knee Continues with chronic pain    Patient was noted with low testosterone  at 280 NG/DL 02/25/4400, with normal CBC and CMP, he is on testosterone  through his PCPs office    Denies nausea or vomiting  Has Acid reflux resolved with prilosec  Denies constipation or diarrhea but has rare loose stools  No recent falls or one fractures   Medication Calcium  citrate 1200-1500 mg daily- not taking  MVI, 2 tabs daily Ergocalciferol  50,000 units weekly Calcitriol  0.25 mcg daily Alendronate  70 mg  weekly      HISTORY:  Past Medical History:  Past Medical History:  Diagnosis Date   Allergic rhinitis due to pollen    Anemia    Anxiety    Chronic pain    Colon polyps    Depression    Diverticulitis large intestine    Erosive esophagitis    GERD (gastroesophageal reflux disease)    HTN (hypertension)    Irritable bowel syndrome    Metabolic bone disease 09/01/2022   Obesity    Osteoarthritis, knee    Sleep apnea    does not use cpap, pt states he no longer needed CPAP due to weight loss   Sleep disturbance    Vitamin D  deficiency 09/01/2022   Past Surgical History:  Past Surgical History:  Procedure Laterality Date   ABDOMINAL EXPOSURE N/A 03/06/2023   Procedure: ABDOMINAL EXPOSURE;  Surgeon: Young Hensen, MD;  Location: [facility] OR;  Service: Vascular;  Laterality: N/A;   ACHILLES TENDON REPAIR Right 12/26/2006   ANTERIOR LUMBAR FUSION N/A 03/06/2023   Procedure: L5-S1 ANTERIOR LUMBAR FUSION 1 LEVEL;  Surgeon: Diedra Fowler, MD;  Location: [facility];  Service: Orthopedics;  Laterality: N/A;   BIOPSY  08/29/2023   Procedure: BIOPSY;  Surgeon: Selena Daily, MD;  Location: [facility] ENDOSCOPY;  Service: Gastroenterology;;   CATARACT EXTRACTION W/PHACO Left 07/20/2021   Procedure: CATARACT EXTRACTION PHACO AND INTRAOCULAR LENS PLACEMENT (IOC) LEFT 2.11 00:24.0;  Surgeon: Clair Crews, MD;  Location: [facility]ERY [facility];  Service: Ophthalmology;  Laterality:  Left;  sleep apnea   CATARACT EXTRACTION W/PHACO Right 08/03/2021   Procedure: CATARACT EXTRACTION PHACO AND INTRAOCULAR LENS PLACEMENT (IOC) RIGHT;  Surgeon: Clair Crews, MD;  Location: [facility] SURGERY [facility];  Service: Ophthalmology;  Laterality: Right;  3.19 0:33.0   CHOLECYSTECTOMY     COLON RESECTION Left 12/26/2004   due to diverticular disease at [facility]   COLONOSCOPY  2013?   COLONOSCOPY N/A 11/30/2022   Procedure: COLONOSCOPY;  Surgeon: Selena Daily, MD;  Location: [facility] ENDOSCOPY;  Service:  Gastroenterology;  Laterality: N/A;   COLONOSCOPY WITH PROPOFOL  N/A 05/22/2018   Procedure: COLONOSCOPY WITH PROPOFOL ;  Surgeon: Selena Daily, MD;  Location: [facility] END[facility]COPY;  Service: Gastroenterology;  Laterality: N/A;   COLONOSCOPY WITH PROPOFOL  N/A 10/22/2020   Procedure: COLONOSCOPY WITH PROPOFOL ;  Surgeon: Selena Daily, MD;  Location: [facility];  Service: Endoscopy;  Laterality: N/A;  priority 4   COLONOSCOPY WITH PROPOFOL  N/A 11/10/2021   Procedure: COLONOSCOPY WITH PROPOFOL ;  Surgeon: Selena Daily, MD;  Location: [facility] ENDOSCOPY;  Service: Gastroenterology;  Laterality: N/A;   COLONOSCOPY WITH PROPOFOL  N/A 06/16/2022   Procedure: COLONOSCOPY WITH PROPOFOL ;  Surgeon: Selena Daily, MD;  Location: [facility] SURGERY [facility]TR;  Service: Endoscopy;  Laterality: N/A;   COLONOSCOPY WITH PROPOFOL  N/A 11/29/2022   Procedure: COLONOSCOPY WITH PROPOFOL ;  Surgeon: Selena Daily, MD;  Location: [facility] ENDOSCOPY;  Service: Gastroenterology;  Laterality: N/A;   DRUG INDUCED ENDOSCOPY N/A 06/10/2019   Procedure: DRUG INDUCED SLEEP ENDOSCOPY;  Surgeon: Ammon Bales, MD;  Location: [facility];  Service: ENT;  Laterality: N/A;   ESOPHAGOGASTRODUODENOSCOPY (EGD) WITH PROPOFOL  N/A 06/16/2022   Procedure: ESOPHAGOGASTRODUODENOSCOPY (EGD) WITH PROPOFOL ;  Surgeon: Selena Daily, MD;  Location: [facility]ERY [facility];  Service: Endoscopy;  Laterality: N/A;   ESOPHAGOGASTRODUODENOSCOPY (EGD) WITH PROPOFOL  N/A 05/24/2023   Procedure: ESOPHAGOGASTRODUODENOSCOPY (EGD) WITH PROPOFOL ;  Surgeon: Selena Daily, MD;  Location: [facility] ENDOSCOPY;  Service: Gastroenterology;  Laterality: N/A;   ESOPHAGOGASTRODUODENOSCOPY (EGD) WITH PROPOFOL  N/A 08/29/2023   Procedure: ESOPHAGOGASTRODUODENOSCOPY (EGD) WITH PROPOFOL ;  Surgeon: Selena Daily, MD;  Location: [facility] ENDOSCOPY;  Service: Gastroenterology;  Laterality: N/A;   GASTRIC BYPASS N/A 12/27/2007   HARDWARE  REMOVAL Right 06/22/2023   Procedure: HARDWARE REMOVAL;  Surgeon: Hardy Lia, MD;  Location: [facility] OR;  Service: Orthopedics;  Laterality: Right;   HARDWARE REMOVAL N/A 01/23/2024   Procedure: REMOVAL OF POSTERIOR SPINAL INSTRUMENTATION;  Surgeon: Diedra Fowler, MD;  Location: [facility] OR;  Service: Orthopedics;  Laterality: N/A;   HIATAL HERNIA REPAIR  09/25/2014   at [facility]   KNEE ARTHROSCOPY Right 06/22/2023   Procedure: ARTHROSCOPY KNEE;  Surgeon: Hardy Lia, MD;  Location: [facility] OR;  Service: Orthopedics;  Laterality: Right;   ORIF FEMUR FRACTURE Right 08/30/2022   Procedure: OPEN REDUCTION INTERNAL FIXATION (ORIF) DISTAL FEMUR FRACTURE;  Surgeon: Hardy Lia, MD;  Location: [facility] OR;  Service: Orthopedics;  Laterality: Right;   POLYPECTOMY  10/22/2020   Procedure: POLYPECTOMY;  Surgeon: Selena Daily, MD;  Location: [facility];  Service: Endoscopy;;   POLYPECTOMY  06/16/2022   Procedure: POLYPECTOMY;  Surgeon: Selena Daily, MD;  Location: [facility]ERY [facility];  Service: Endoscopy;;   ROTATOR CUFF REPAIR Right    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   TOTAL KNEE ARTHROPLASTY Right 09/19/2023   Procedure: RIGHT TOTAL KNEE ARTHROPLASTY;  Surgeon: Arnie Lao, MD;  Location: [facility] OR;  Service: Orthopedics;  Laterality: Right;    Social History:  reports  that he has quit smoking. His smoking use included cigars. He has been exposed to tobacco smoke. He has never used smokeless tobacco. He reports current alcohol use. He reports that he does not use drugs. Family History: family history includes Asthma in his sister; Cancer in his paternal uncle; Colon cancer (age of onset: 100) in his father; Diabetes in his mother and another family member; Heart disease in his mother and another family member; Hypertension in his mother and another family member; Kidney disease in his mother and another family member; Lung cancer in his paternal grandmother; Obesity in his  brother and sister; Stroke in his sister.   HOME MEDICATIONS: Allergies as of 04/22/2024   No Known Allergies      Medication List        Accurate as of April 22, 2024  8:54 AM. If you have any questions, ask your nurse or doctor.          alendronate  70 MG tablet Commonly known as: Fosamax  Take 1 tablet (70 mg total) by mouth once a week. Take with a full glass of water  on an empty stomach. What changed: when to take this   baclofen  10 MG tablet Commonly known as: LIORESAL  Take 10 mg by mouth 3 (three) times daily.   BD Disp Needles 18G X 1-1/2" Misc Generic drug: NEEDLE (DISP) 18 G 2 each by Does not apply route every 14 (fourteen) days.   calcitRIOL  0.25 MCG capsule Commonly known as: ROCALTROL  Take 1 capsule (0.25 mcg total) by mouth daily.   CALCIUM  PO Take 1 tablet by mouth daily.   Capvaxive 0.5 ML injection Generic drug: pneumococcal 21-valent conjugate vaccine   cephALEXin  500 MG capsule Commonly known as: KEFLEX  Take 1 capsule (500 mg total) by mouth 4 (four) times daily.   cetirizine  10 MG tablet Commonly known as: ZYRTEC  Take 1 tablet (10 mg total) by mouth daily.   dicyclomine  10 MG capsule Commonly known as: BENTYL  Take 1 to 2 capsules as needed for abdominal cramps and diarrhea every 6 to 8 hours   DULoxetine  60 MG capsule Commonly known as: CYMBALTA  TAKE 1 CAPSULE BY MOUTH ONCE DAILY   FeroSul 325 (65 Fe) MG tablet Generic drug: ferrous sulfate  TAKE ONE TABLET EACH MORNING WITH BREAKFAST   fluticasone  50 MCG/ACT nasal spray Commonly known as: FLONASE  Place 2 sprays into both nostrils daily as needed for allergies or rhinitis.   gabapentin  300 MG capsule Commonly known as: NEURONTIN  TAKE 1 CAPSULE BY MOUTH TWICE DAILY AND TAKE 4 CAPSULES AT BEDTIME   hydrochlorothiazide  25 MG tablet Commonly known as: HYDRODIURIL  TAKE 1 TABLET BY MOUTH DAILY   HYPODERMIC NEEDLE 22GX1-1/2" 22G X 1-1/2" Misc 1 each by Does not apply route every 14  (fourteen) days.   lipase/protease/amylase 42595 UNITS Cpep capsule Commonly known as: Creon  Take 2 capsules (72,000 Units total) by mouth 3 (three) times daily with meals. May also take 1 capsule (36,000 Units total) as needed (with snacks).   lisinopril  20 MG tablet Commonly known as: ZESTRIL  TAKE 1 TABLET BY MOUTH DAILY   Luer Lock Safety Syringes 22G X 1-1/2" 3 ML Misc Generic drug: SYRINGE-NEEDLE (DISP) 3 ML every 14 (fourteen) days.   meloxicam  15 MG tablet Commonly known as: MOBIC  TAKE 1 TABLET BY MOUTH DAILY AS NEEDED FOR PAIN What changed: when to take this   methocarbamol  500 MG tablet Commonly known as: ROBAXIN  Take 1 tablet (500 mg total) by mouth every 8 (eight) hours as needed (  pain, muscle spasms).   multivitamin tablet Take 1 tablet by mouth daily.   naloxone  4 MG/0.1ML Liqd nasal spray kit Commonly known as: NARCAN  In the event of opiod overdose   omeprazole  40 MG capsule Commonly known as: PRILOSEC Take 1 capsule (40 mg total) by mouth 2 (two) times daily before a meal.   oxyCODONE -acetaminophen  7.5-325 MG tablet Commonly known as: Percocet Take 1-2 tablets by mouth every 6 (six) hours as needed for severe pain (pain score 7-10).   PREVIDENT 5000 BOOSTER PLUS DT Place 1 application  onto teeth 2 (two) times daily.   QUEtiapine  25 MG tablet Commonly known as: SEROQUEL  TAKE 1 TO 2 TABLETS BY MOUTH AT BEDTIME AS NEEDED FOR SLEEP   Syringe (Disposable) 3 ML Misc 1 each by Does not apply route every 14 (fourteen) days.   testosterone  cypionate 200 MG/ML injection Commonly known as: DEPOTESTOSTERONE CYPIONATE Inject 1 mL (200 mg total) into the muscle every 14 (fourteen) days.   traZODone  50 MG tablet Commonly known as: DESYREL  Take 1-2 tablets (50-100 mg total) by mouth at bedtime as needed for sleep.   VITAMIN B-12 PO Take 1 tablet by mouth daily.   Vitamin D  (Ergocalciferol ) 1.25 MG (50000 UNIT) Caps capsule Commonly known as: DRISDOL  Take 1  capsule (50,000 Units total) by mouth every 7 (seven) days. What changed: when to take this          REVIEW OF SYSTEMS: A comprehensive ROS was conducted with the patient and is negative except as per HPI    OBJECTIVE:  VS: BP (!) 160/94 (BP Location: Left Arm, Patient Position: Sitting, Cuff Size: Small)   Pulse (!) 105   Ht 5\' 10"  (1.778 m)   Wt 205 lb (93 kg)   SpO2 98%   BMI 29.41 kg/m    Wt Readings from Last 3 Encounters:  04/22/24 205 lb (93 kg)  04/11/24 188 lb (85.3 kg)  03/15/24 192 lb (87.1 kg)     EXAM: General: Pt appears well and is in NAD  Lungs: Clear with good BS bilat   Heart: Auscultation: RRR.  Extremities:  BL LE: No pretibial edema   Mental Status: Judgment, insight: Intact Orientation: Oriented to time, place, and person Mood and affect: No depression, anxiety, or agitation     DATA REVIEWED:   Latest Reference Range & Units 10/23/23 08:59  Sodium 135 - 145 mEq/L 141  Potassium 3.5 - 5.1 mEq/L 4.5  Chloride 96 - 112 mEq/L 109  CO2 19 - 32 mEq/L 28  Glucose 70 - 99 mg/dL 161 (H)  BUN 6 - 23 mg/dL 12  Creatinine 0.96 - 0.45 mg/dL 4.09  Calcium  8.4 - 10.5 mg/dL 8.5  Phosphorus 2.3 - 4.6 mg/dL 3.6  Alkaline Phosphatase 39 - 117 U/L 84  Albumin  3.5 - 5.2 g/dL 3.7  AST 0 - 37 U/L 37  ALT 0 - 53 U/L 38  Total Protein 6.0 - 8.3 g/dL 6.6  Total Bilirubin 0.2 - 1.2 mg/dL 0.4  GFR >81.19 mL/min 88.75    Latest Reference Range & Units 10/23/23 08:59  VITD 30.00 - 100.00 ng/mL 12.11 (L)    Latest Reference Range & Units 10/23/23 08:59  PTH, Intact 16 - 77 pg/mL 90 (H)  TSH 0.35 - 5.50 uIU/mL 1.38  T4,Free(Direct) 0.60 - 1.60 ng/dL 1.47  (H): Data is abnormally high  DXA 11/23/2022  AP Spine L2-L4 11/23/2022 58.3 Osteoporosis -2.7 0.927 g/cm2 - -   DualFemur Neck Right 11/23/2022 58.5  Osteoporosis -3.1 0.670 g/cm2 - - ASSESSMENT:   The BMD measured at Femur Neck Right is 0.670 g/cm2 with a T-score of -3.1. This patient is  considered Osteopenic according to the World Health Organization [facility]) criteria. The scan quality is good. L1 was excluded due to previous surgery.   Old records , labs and images have been reviewed.    ASSESSMENT/PLAN/RECOMMENDATIONS:   Osteoporosis:  -Patient with history of Roux-en-Y in 2009 which is a contributing factor into her osteoporosis -Emphasized the importance of optimizing calcium  and vitamin D  intake, unfortunately he continues to be uncertain about calcium  citrate intake.  I again emphasized the importance of this -Limited on exercise due to chronic pain -BMP shows normal GFR with low normal serum calcium  -TFTs and phosphorus have been normal in the past -***   Medications : Calcium  citrate 1200-1500 mg daily 2 multivitamins daily Start calcitriol  0.25 mcg daily  2. Vitamin D  Deficiency   -Vitamin D  very low  - Will stop OTC Vitamin D  and start Ergocalciferol    Medication  Start Ergocalciferol  50,000 IU daily   3. Secondary Hyperparathyroidism :  -This is due to low vitamin D  and calcium  -Will replenish -Will continue to monitor   Follow-up in 6 months  Signed electronically by: Natale Bail, MD  [facility] Endocrinology  [facility] [address] [place], [place] [address] Phone: [phone] FAX: [phone]   CC: Helaine Llanos, MD [address] [place] [address] Phone: [phone] Fax: [phone]   Return to Endocrinology clinic as below: Future Appointments  Date Time Provider Department Center  04/23/2024  9:30 AM [facility] LAB [facility] None  04/30/2024 10:15 AM Timmy Forbes, MD [facility] None  04/30/2024 10:30 AM [facility] INFUSION CHAIR 1 [facility] None  05/23/2024  9:20 AM Lylia Sand, MD [facility] CPR  07/15/2024  9:00 AM Helaine Llanos, MD [facility] PEC  07/29/2024  8:30 AM Arnie Lao, MD [facility] None

## 2024-04-22 NOTE — Patient Instructions (Addendum)
 Continues Alendronate  (Fosamax ) 70 mg once weekly Please make sure you take calcium  CITRATE 1200-1500 mg a day  Take TWO Multivitamins a day  Continues Vitamin D  at 50,000 units weekly

## 2024-04-23 ENCOUNTER — Inpatient Hospital Stay: Payer: 59 | Attending: Oncology

## 2024-04-23 ENCOUNTER — Other Ambulatory Visit (HOSPITAL_COMMUNITY): Payer: Self-pay

## 2024-04-23 ENCOUNTER — Telehealth: Payer: Self-pay

## 2024-04-23 ENCOUNTER — Encounter: Payer: Self-pay | Admitting: Internal Medicine

## 2024-04-23 DIAGNOSIS — D509 Iron deficiency anemia, unspecified: Secondary | ICD-10-CM | POA: Insufficient documentation

## 2024-04-23 DIAGNOSIS — D508 Other iron deficiency anemias: Secondary | ICD-10-CM

## 2024-04-23 LAB — IRON AND TIBC
Iron: 52 ug/dL (ref 45–182)
Saturation Ratios: 12 % — ABNORMAL LOW (ref 17.9–39.5)
TIBC: 454 ug/dL — ABNORMAL HIGH (ref 250–450)
UIBC: 402 ug/dL

## 2024-04-23 LAB — PTH, INTACT AND CALCIUM
Calcium: 8.6 mg/dL (ref 8.6–10.3)
PTH: 107 pg/mL — ABNORMAL HIGH (ref 16–77)

## 2024-04-23 LAB — CBC WITH DIFFERENTIAL (CANCER CENTER ONLY)
Abs Immature Granulocytes: 0.02 10*3/uL (ref 0.00–0.07)
Basophils Absolute: 0 10*3/uL (ref 0.0–0.1)
Basophils Relative: 0 %
Eosinophils Absolute: 0.1 10*3/uL (ref 0.0–0.5)
Eosinophils Relative: 2 %
HCT: 38.9 % — ABNORMAL LOW (ref 39.0–52.0)
Hemoglobin: 12.5 g/dL — ABNORMAL LOW (ref 13.0–17.0)
Immature Granulocytes: 0 %
Lymphocytes Relative: 26 %
Lymphs Abs: 1.5 10*3/uL (ref 0.7–4.0)
MCH: 30.7 pg (ref 26.0–34.0)
MCHC: 32.1 g/dL (ref 30.0–36.0)
MCV: 95.6 fL (ref 80.0–100.0)
Monocytes Absolute: 0.5 10*3/uL (ref 0.1–1.0)
Monocytes Relative: 10 %
Neutro Abs: 3.5 10*3/uL (ref 1.7–7.7)
Neutrophils Relative %: 62 %
Platelet Count: 258 10*3/uL (ref 150–400)
RBC: 4.07 MIL/uL — ABNORMAL LOW (ref 4.22–5.81)
RDW: 14.7 % (ref 11.5–15.5)
WBC Count: 5.7 10*3/uL (ref 4.0–10.5)
nRBC: 0 % (ref 0.0–0.2)

## 2024-04-23 LAB — RETIC PANEL
Immature Retic Fract: 13 % (ref 2.3–15.9)
RBC.: 4.08 MIL/uL — ABNORMAL LOW (ref 4.22–5.81)
Retic Count, Absolute: 54.7 10*3/uL (ref 19.0–186.0)
Retic Ct Pct: 1.3 % (ref 0.4–3.1)
Reticulocyte Hemoglobin: 29.7 pg (ref >27.9)

## 2024-04-23 LAB — VITAMIN D 25 HYDROXY (VIT D DEFICIENCY, FRACTURES): Vit D, 25-Hydroxy: 25 ng/mL — ABNORMAL LOW (ref 30–100)

## 2024-04-23 LAB — ALBUMIN: Albumin: 3.9 g/dL (ref 3.6–5.1)

## 2024-04-23 LAB — FERRITIN: Ferritin: 12 ng/mL — ABNORMAL LOW (ref 24–336)

## 2024-04-23 MED ORDER — CALCITRIOL 0.25 MCG PO CAPS: 0.2500 ug | ORAL_CAPSULE | Freq: Every day | ORAL | 2 refills | Status: DC

## 2024-04-23 MED ORDER — VITAMIN D (ERGOCALCIFEROL) 1.25 MG (50000 UNIT) PO CAPS: 50000.0000 [IU] | ORAL_CAPSULE | ORAL | 3 refills | Status: DC

## 2024-04-23 NOTE — Telephone Encounter (Signed)
 Pharmacy Patient Advocate Encounter   Received notification from CoverMyMeds that prior authorization for calcitriol  is required/requested.   Insurance verification completed.   The patient is insured through [facility] [place] .   Per test claim: PA required; PA submitted to above mentioned insurance via CoverMyMeds Key/confirmation #/EOC [id number] Status is pending

## 2024-04-26 ENCOUNTER — Other Ambulatory Visit: Payer: Self-pay | Admitting: Orthopedic Surgery

## 2024-04-28 MED ORDER — OXYCODONE-ACETAMINOPHEN 7.5-325 MG PO TABS
1.0000 | ORAL_TABLET | Freq: Four times a day (QID) | ORAL | 0 refills | Status: DC | PRN
Start: 2024-04-28 — End: 2024-05-23

## 2024-04-29 NOTE — Addendum Note (Signed)
 Addended by: Lylia Sand on: 04/29/2024 12:03 AM   Modules accepted: Orders

## 2024-04-30 ENCOUNTER — Inpatient Hospital Stay: Payer: 59

## 2024-04-30 ENCOUNTER — Other Ambulatory Visit: Payer: Self-pay | Admitting: Internal Medicine

## 2024-04-30 ENCOUNTER — Inpatient Hospital Stay: Payer: 59 | Attending: Oncology | Admitting: Oncology

## 2024-04-30 ENCOUNTER — Encounter: Payer: Self-pay | Admitting: Oncology

## 2024-04-30 VITALS — BP 167/104 | HR 58 | Temp 96.9°F | Resp 16 | Wt 204.0 lb

## 2024-04-30 DIAGNOSIS — Z79899 Other long term (current) drug therapy: Secondary | ICD-10-CM | POA: Insufficient documentation

## 2024-04-30 DIAGNOSIS — D508 Other iron deficiency anemias: Secondary | ICD-10-CM | POA: Diagnosis not present

## 2024-04-30 DIAGNOSIS — D509 Iron deficiency anemia, unspecified: Secondary | ICD-10-CM | POA: Insufficient documentation

## 2024-04-30 DIAGNOSIS — Z9884 Bariatric surgery status: Secondary | ICD-10-CM | POA: Diagnosis not present

## 2024-04-30 MED ORDER — TESTOSTERONE CYPIONATE 200 MG/ML IM SOLN: 200.0000 mg | INTRAMUSCULAR | 3 refills | Status: DC

## 2024-04-30 NOTE — Assessment & Plan Note (Addendum)
#  Iron  deficiency anemia, history of gastri bypas.  previously tolerated IV Venofer  treatments. Labs reviewed and discussed with patient Lab Results  Component Value Date   HGB 12.5 (L) 04/23/2024   TIBC 454 (H) 04/23/2024   IRONPCTSAT 12 (L) 04/23/2024   FERRITIN 12 (L) 04/23/2024    Recommend Venofer  200mg  x 3 today.  After that recommend patient to contnue oral ferrous sulfate  supplementation every other day

## 2024-04-30 NOTE — Assessment & Plan Note (Addendum)
 IDA due to ? Occult blood loss vs mal absorption. EGD/colonoscopy up-to-date.  Monitor B12 Folate levels periodically.

## 2024-04-30 NOTE — Telephone Encounter (Signed)
 Last written 03-07-24 10 ml 0RF Last OV 03-01-24 Next OV 07-15-24 Last Labs 08-31-22 [facility] [address]

## 2024-04-30 NOTE — Progress Notes (Signed)
 Hematology/Oncology Progress note [phone] Fax:([phone]) [phone]        CHIEF COMPLAINTS/REASON FOR VISIT:  Follow-up for iron  deficiency anemia.   ASSESSMENT & PLAN:   Iron  deficiency anemia #Iron  deficiency anemia, history of gastri bypas.  previously tolerated IV Venofer  treatments. Labs reviewed and discussed with patient Lab Results  Component Value Date   HGB 12.5 (L) 04/23/2024   TIBC 454 (H) 04/23/2024   IRONPCTSAT 12 (L) 04/23/2024   FERRITIN 12 (L) 04/23/2024    Recommend Venofer  200mg  x 3 today.  After that recommend patient to contnue oral ferrous sulfate  supplementation every other day   Personal hx of gastric bypass IDA due to ? Occult blood loss vs mal absorption. EGD/colonoscopy up-to-date.  Monitor B12 Folate levels periodically.    Orders Placed This Encounter  Procedures   CBC with Differential (Cancer Center Only)    Standing Status:   Future    Expected Date:   08/31/2024    Expiration Date:   04/30/2025   Iron  and TIBC    Standing Status:   Future    Expected Date:   08/31/2024    Expiration Date:   04/30/2025   Ferritin    Standing Status:   Future    Expected Date:   08/31/2024    Expiration Date:   04/30/2025   Retic Panel    Standing Status:   Future    Expected Date:   08/31/2024    Expiration Date:   04/30/2025   Follow up in 4 months.  All questions were answered. The patient knows to call the clinic with any problems, questions or concerns.  Timmy Forbes, MD, PhD [facility] Hematology Oncology 04/30/2024     HISTORY OF PRESENTING ILLNESS:   Harold Smith is a  58 y.o.  male with PMH listed below was seen in consultation at the request of  Curt Dover I, MD  for evaluation of anemia  05/03/2022, patient had preop blood work done showed hemoglobin of 8.3, hematocrit 75.  Platelet count 258, white count 8.5.  Patient had plan of back surgery which was aborted due to decreased hemoglobin. Reviewed his previous lab  records 01/30/2022, hemoglobin 10, MCV 88.6, 07/02/2021, hemoglobin 11, MCV 90.2. Patient denies any black or bloody stool.  Denies any unintentional weight loss, night sweats, fever, epigastric discomfort. Patient takes Mobic  as needed for pain. 11/10/2021, colonoscopy showed 2 small polyps in the proximal transverse colon.  Pathology showed tubular adenoma. Family history is positive for father who passed away from colorectal cancer at age of 37.  11/30/22 colonoscopy showed  Six 3 to 6 mm polyps in the descending colon, in the transverse colon and in the cecum, removed with a cold snare. Resected and retrieved.- The distal rectum and anal verge are normal on retroflexion view Pathology showed multiple tubular adenoma, hyperplastic polyp. Negative for dysplasia and malignancy  .  INTERVAL HISTORY Harold Smith is a 58 y.o. male who has above history reviewed by me today presents for follow up visit for iron  deficiency anemia. he previously tolerated IV Venofer  treatments, he tolerates well.  No new complaints. He denies dark tarry stool, blood in stool, abdominal pain.  Review of Systems  Constitutional:  Negative for appetite change, chills, fatigue, fever and unexpected weight change.  HENT:   Negative for hearing loss and voice change.   Eyes:  Negative for eye problems and icterus.  Respiratory:  Negative for chest tightness, cough and shortness of breath.  Cardiovascular:  Negative for chest pain and leg swelling.  Gastrointestinal:  Negative for abdominal distention and abdominal pain.  Endocrine: Negative for hot flashes.  Genitourinary:  Negative for difficulty urinating, dysuria and frequency.   Musculoskeletal:  Positive for back pain. Negative for arthralgias.  Skin:  Negative for itching and rash.  Neurological:  Negative for light-headedness and numbness.  Hematological:  Negative for adenopathy. Does not bruise/bleed easily.  Psychiatric/Behavioral:  Negative for  confusion.     MEDICAL HISTORY:  Past Medical History:  Diagnosis Date   Allergic rhinitis due to pollen    Anemia    Anxiety    Chronic pain    Colon polyps    Depression    Diverticulitis large intestine    Erosive esophagitis    GERD (gastroesophageal reflux disease)    HTN (hypertension)    Irritable bowel syndrome    Metabolic bone disease 09/01/2022   Obesity    Osteoarthritis, knee    Sleep apnea    does not use cpap, pt states he no longer needed CPAP due to weight loss   Sleep disturbance    Vitamin D  deficiency 09/01/2022    SURGICAL HISTORY: Past Surgical History:  Procedure Laterality Date   ABDOMINAL EXPOSURE N/A 03/06/2023   Procedure: ABDOMINAL EXPOSURE;  Surgeon: Young Hensen, MD;  Location: [facility] OR;  Service: Vascular;  Laterality: N/A;   ACHILLES TENDON REPAIR Right 12/26/2006   ANTERIOR LUMBAR FUSION N/A 03/06/2023   Procedure: L5-S1 ANTERIOR LUMBAR FUSION 1 LEVEL;  Surgeon: Diedra Fowler, MD;  Location: [facility] OR;  Service: Orthopedics;  Laterality: N/A;   BIOPSY  08/29/2023   Procedure: BIOPSY;  Surgeon: Selena Daily, MD;  Location[facility] END[facility]COPY;  Service: Gastroenterology;;   CATARACT EXTRACTION W/PHACO Left 07/20/2021   Procedure: CATARACT EXTRACTION PHACO AND INTRAOCULAR LENS PLACEMENT (IOC) LEFT 2.11 00:24.0;  Surgeon: Clair Crews, MD;  Location: [facility] SURGERY CNTR;  Service: Ophthalmology;  Laterality: Left;  sleep apnea   CATARACT EXTRACTION W/PHACO Right 08/03/2021   Procedure: CATARACT EXTRACTION PHACO AND INTRAOCULAR LENS PLACEMENT (IOC) RIGHT;  Surgeon: Clair Crews, MD;  Location: [facility] SURGERY CNTR;  Service: Ophthalmology;  Laterality: Right;  3.19 0:33.0   CHOLECYSTECTOMY     COLON RESECTION Left 12/26/2004   due to diverticular disease at [facility]   COLONOSCOPY  2013?   COLONOSCOPY N/A 11/30/2022   Procedure: COLONOSCOPY;  Surgeon: Selena Daily, MD;  Location: [facility] ENDOSCOPY;  Service: Gastroenterology;   Laterality: N/A;   COLONOSCOPY WITH PROPOFOL  N/A 05/22/2018   Procedure: COLONOSCOPY WITH PROPOFOL ;  Surgeon: Selena Daily, MD;  Location: [facility] ENDOSCOPY;  Service: Gastroenterology;  Laterality: N/A;   COLONOSCOPY WITH PROPOFOL  N/A 10/22/2020   Procedure: COLONOSCOPY WITH PROPOFOL ;  Surgeon: Selena Daily, MD;  Location: [facility] SURGERY [facility]TR;  Service: Endoscopy;  Laterality: N/A;  priority 4   COLONOSCOPY WITH PROPOFOL  N/A 11/10/2021   Procedure: COLONOSCOPY WITH PROPOFOL ;  Surgeon: Selena Daily, MD;  Location: [facility] ENDOSCOPY;  Service: Gastroenterology;  Laterality: N/A;   COLONOSCOPY WITH PROPOFOL  N/A 06/16/2022   Procedure: COLONOSCOPY WITH PROPOFOL ;  Surgeon: Selena Daily, MD;  Location: [facility] SURGERY [facility]TR;  Service: Endoscopy;  Laterality: N/A;   COLONOSCOPY WITH PROPOFOL  N/A 11/29/2022   Procedure: COLONOSCOPY WITH PROPOFOL ;  Surgeon: Selena Daily, MD;  Location: [facility] ENDOSCOPY;  Service: Gastroenterology;  Laterality: N/A;   DRUG INDUCED ENDOSCOPY N/A 06/10/2019   Procedure: DRUG INDUCED SLEEP ENDOSCOPY;  Surgeon: Ammon Bales, MD;  Location: [facility];  Service: ENT;  Laterality: N/A;   ESOPHAGOGASTRODUODENOSCOPY (EGD) WITH PROPOFOL  N/A 06/16/2022   Procedure: ESOPHAGOGASTRODUODENOSCOPY (EGD) WITH PROPOFOL ;  Surgeon: Selena Daily, MD;  Location: [facility] SURGERY [facility];  Service: Endoscopy;  Laterality: N/A;   ESOPHAGOGASTRODUODENOSCOPY (EGD) WITH PROPOFOL  N/A 05/24/2023   Procedure: ESOPHAGOGASTRODUODENOSCOPY (EGD) WITH PROPOFOL ;  Surgeon: Selena Daily, MD;  Location: [facility] ENDOSCOPY;  Service: Gastroenterology;  Laterality: N/A;   ESOPHAGOGASTRODUODENOSCOPY (EGD) WITH PROPOFOL  N/A 08/29/2023   Procedure: ESOPHAGOGASTRODUODENOSCOPY (EGD) WITH PROPOFOL ;  Surgeon: Selena Daily, MD;  Location: [facility] ENDOSCOPY;  Service: Gastroenterology;  Laterality: N/A;   GASTRIC BYPASS N/A 12/27/2007   HARDWARE REMOVAL Right  06/22/2023   Procedure: HARDWARE REMOVAL;  Surgeon: Hardy Lia, MD;  Location: [facility] OR;  Service: Orthopedics;  Laterality: Right;   HARDWARE REMOVAL N/A 01/23/2024   Procedure: REMOVAL OF POSTERIOR SPINAL INSTRUMENTATION;  Surgeon: Diedra Fowler, MD;  Location: [facility] OR;  Service: Orthopedics;  Laterality: N/A;   HIATAL HERNIA REPAIR  09/25/2014   at [facility]   KNEE ARTHROSCOPY Right 06/22/2023   Procedure: ARTHROSCOPY KNEE;  Surgeon: Hardy Lia, MD;  Location: [facility] OR;  Service: Orthopedics;  Laterality: Right;   ORIF FEMUR FRACTURE Right 08/30/2022   Procedure: OPEN REDUCTION INTERNAL FIXATION (ORIF) DISTAL FEMUR FRACTURE;  Surgeon: Hardy Lia, MD;  Location: [facility] OR;  Service: Orthopedics;  Laterality: Right;   POLYPECTOMY  10/22/2020   Procedure: POLYPECTOMY;  Surgeon: Selena Daily, MD;  Location: [facility] SURGERY [facility];  Service: Endoscopy;;   POLYPECTOMY  06/16/2022   Procedure: POLYPECTOMY;  Surgeon: Selena Daily, MD;  Location: [facility] SURGERY CNTR;  Service: Endoscopy;;   ROTATOR CUFF REPAIR Right    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   TOTAL KNEE ARTHROPLASTY Right 09/19/2023   Procedure: RIGHT TOTAL KNEE ARTHROPLASTY;  Surgeon: Arnie Lao, MD;  Location: [facility] OR;  Service: Orthopedics;  Laterality: Right;    SOCIAL HISTORY: Social History   Socioeconomic History   Marital status: Single    Spouse name: Not on file   Number of children: 3   Years of education: Not on file   Highest education level: Some college, no degree  Occupational History   Occupation: Hydrographic surveyor  Tobacco Use   Smoking status: Former    Types: Gaffer exposure: Past   Smokeless tobacco: Never   Tobacco comments:    occasional cigar  Vaping Use   Vaping status: Never Used  Substance and Sexual Activity   Alcohol use: Yes    Comment: Occasionally   Drug use: No   Sexual activity: Yes  Other Topics Concern   Not on file  Social  History Narrative   Not on file   Social Drivers of Health   Financial Resource Strain: Medium Risk (01/04/2024)   Overall Financial Resource Strain (CARDIA)    Difficulty of Paying Living Expenses: Somewhat hard  Food Insecurity: Food Insecurity Present (01/04/2024)   Hunger Vital Sign    Worried About Running Out of Food in the Last Year: Sometimes true    Ran Out of Food in the Last Year: Sometimes true  Transportation Needs: No Transportation Needs (01/04/2024)   PRAPARE - Administrator, Civil Service (Medical): No    Lack of Transportation (Non-Medical): No  Physical Activity: Unknown (01/04/2024)   Exercise Vital Sign    Days of Exercise per Week: 0 days    Minutes of Exercise per Session: Not on file  Stress: Stress Concern Present (01/04/2024)   [employer] of Occupational Health - Occupational Stress Questionnaire    Feeling of Stress : Very much  Social Connections: Moderately Integrated (01/04/2024)   Social Connection and Isolation Panel [NHANES]    Frequency of Communication with Friends and Family: Twice a week    Frequency of Social Gatherings with Friends and Family: Twice a week    Attends Religious Services: 1 to 4 times per year    Active Member of [employer] or Organizations: No    Attends Engineer, structural: 1 to 4 times per year    Marital Status: Divorced  Catering manager Violence: Not At Risk (03/06/2023)   Humiliation, Afraid, Rape, and Kick questionnaire    Fear of Current or Ex-Partner: No    Emotionally Abused: No    Physically Abused: No    Sexually Abused: No    FAMILY HISTORY: Family History  Problem Relation Age of Onset   Hypertension Mother    Diabetes Mother    Kidney disease Mother    Heart disease Mother    Colon cancer Father 64   Asthma Sister    Obesity Sister    Stroke Sister    Obesity Brother    Diabetes Other    Heart disease Other    Hypertension Other    Kidney disease Other    Cancer Paternal Uncle         unk type, possible prostate   Lung cancer Paternal Grandmother     ALLERGIES:  has no known allergies.  MEDICATIONS:  Current Outpatient Medications  Medication Sig Dispense Refill   alendronate  (FOSAMAX ) 70 MG tablet Take 1 tablet (70 mg total) by mouth once a week. Take with a full glass of water  on an empty stomach. 13 tablet 3   baclofen  (LIORESAL ) 10 MG tablet Take 10 mg by mouth 3 (three) times daily.     calcitRIOL  (ROCALTROL ) 0.25 MCG capsule Take 1 capsule (0.25 mcg total) by mouth daily. 90 capsule 2   CALCIUM  PO Take 1 tablet by mouth daily.     CAPVAXIVE 0.5 ML injection      cetirizine  (ZYRTEC ) 10 MG tablet Take 1 tablet (10 mg total) by mouth daily. 90 tablet 3   Cyanocobalamin  (VITAMIN B-12 PO) Take 1 tablet by mouth daily.     dicyclomine  (BENTYL ) 10 MG capsule Take 1 to 2 capsules as needed for abdominal cramps and diarrhea every 6 to 8 hours 240 capsule 3   DULoxetine  (CYMBALTA ) 60 MG capsule TAKE 1 CAPSULE BY MOUTH ONCE DAILY 90 capsule 3   ferrous sulfate  (FEROSUL) 325 (65 FE) MG tablet TAKE ONE TABLET EACH MORNING WITH BREAKFAST 30 tablet 11   fluticasone  (FLONASE ) 50 MCG/ACT nasal spray Place 2 sprays into both nostrils daily as needed for allergies or rhinitis. 16 g 11   gabapentin  (NEURONTIN ) 300 MG capsule TAKE 1 CAPSULE BY MOUTH TWICE DAILY AND TAKE 4 CAPSULES AT BEDTIME 180 capsule 11   hydrochlorothiazide  (HYDRODIURIL ) 25 MG tablet TAKE 1 TABLET BY MOUTH DAILY 90 tablet 3   lipase/protease/amylase (CREON ) 36000 UNITS CPEP capsule Take 2 capsules (72,000 Units total) by mouth 3 (three) times daily with meals. May also take 1 capsule (36,000 Units total) as needed (with snacks). 240 capsule 5   lisinopril  (ZESTRIL ) 20 MG tablet TAKE 1 TABLET BY MOUTH DAILY 90 tablet 3   LUER LOCK SAFETY SYRINGES 22G X 1-1/2" 3 ML MISC every 14 (fourteen) days.  meloxicam  (MOBIC ) 15 MG tablet TAKE 1 TABLET BY MOUTH DAILY AS NEEDED FOR PAIN (Patient taking differently: Take 15 mg  by mouth daily. for pain) 90 tablet 1   methocarbamol  (ROBAXIN ) 500 MG tablet Take 1 tablet (500 mg total) by mouth every 8 (eight) hours as needed (pain, muscle spasms). 50 tablet 0   Multiple Vitamin (MULTIVITAMIN) tablet Take 1 tablet by mouth daily.     naloxone  (NARCAN ) nasal spray 4 mg/0.1 mL In the event of opiod overdose 1 each 0   Needle, Disp, (HYPODERMIC NEEDLE 22GX1-1/2") 22G X 1-1/2" MISC 1 each by Does not apply route every 14 (fourteen) days. 2 each 12   NEEDLE, DISP, 18 G (BD DISP NEEDLES) 18G X 1-1/2" MISC 2 each by Does not apply route every 14 (fourteen) days. 12 each 6   omeprazole  (PRILOSEC) 40 MG capsule Take 1 capsule (40 mg total) by mouth 2 (two) times daily before a meal. 180 capsule 3   oxyCODONE -acetaminophen  (PERCOCET) 7.5-325 MG tablet Take 1-2 tablets by mouth every 6 (six) hours as needed for severe pain (pain score 7-10). 120 tablet 0   QUEtiapine  (SEROQUEL ) 25 MG tablet TAKE 1 TO 2 TABLETS BY MOUTH AT BEDTIME AS NEEDED FOR SLEEP 60 tablet 1   Sodium Fluoride (PREVIDENT 5000 BOOSTER PLUS DT) Place 1 application  onto teeth 2 (two) times daily.     Syringe, Disposable, 3 ML MISC 1 each by Does not apply route every 14 (fourteen) days. 6 each 6   traZODone  (DESYREL ) 50 MG tablet Take 1-2 tablets (50-100 mg total) by mouth at bedtime as needed for sleep. 180 tablet 1   Vitamin D , Ergocalciferol , (DRISDOL ) 1.25 MG (50000 UNIT) CAPS capsule Take 1 capsule (50,000 Units total) by mouth every 7 (seven) days. 13 capsule 3   cephALEXin  (KEFLEX ) 500 MG capsule Take 1 capsule (500 mg total) by mouth 4 (four) times daily. (Patient not taking: Reported on 04/30/2024) 20 capsule 0   testosterone  cypionate (DEPOTESTOSTERONE CYPIONATE) 200 MG/ML injection Inject 1 mL (200 mg total) into the muscle every 14 (fourteen) days. 10 mL 3   No current facility-administered medications for this visit.     PHYSICAL EXAMINATION: ECOG PERFORMANCE STATUS: 1 - Symptomatic but completely  ambulatory Vitals:   04/30/24 1003 04/30/24 1026  BP: (!) 190/100 (!) 167/104  Pulse: (!) 58   Resp: 16   Temp: (!) 96.9 F (36.1 C)   SpO2: 100%    Filed Weights   04/30/24 1003  Weight: 204 lb (92.5 kg)    Physical Exam HENT:     Head: Normocephalic and atraumatic.  Eyes:     General: No scleral icterus. Cardiovascular:     Rate and Rhythm: Normal rate.  Pulmonary:     Effort: Pulmonary effort is normal. No respiratory distress.     Breath sounds: No wheezing.  Abdominal:     General: Bowel sounds are normal. There is no distension.     Palpations: Abdomen is soft.  Musculoskeletal:        General: No deformity. Normal range of motion.     Cervical back: Normal range of motion and neck supple.  Skin:    General: Skin is warm and dry.     Findings: No erythema or rash.  Neurological:     Mental Status: He is alert and oriented to person, place, and time. Mental status is at baseline.     Cranial Nerves: No cranial nerve deficit.  Psychiatric:  Mood and Affect: Mood normal.     LABORATORY DATA:  I have reviewed the data as listed    Latest Ref Rng & Units 04/23/2024    9:42 AM 01/17/2024    9:34 AM 10/23/2023    9:44 AM  CBC  WBC 4.0 - 10.5 K/uL 5.7  7.7  9.1   Hemoglobin 13.0 - 17.0 g/dL 21.3  08.6  57.8   Hematocrit 39.0 - 52.0 % 38.9  42.8  36.8   Platelets 150 - 400 K/uL 258  365  255       Latest Ref Rng & Units 04/22/2024    9:25 AM 01/17/2024    9:34 AM 10/23/2023    8:59 AM  CMP  Glucose 70 - 99 mg/dL  469  629   BUN 6 - 20 mg/dL  13  12   Creatinine 5.28 - 1.24 mg/dL  4.13  2.44   Sodium 010 - 145 mmol/L  136  141   Potassium 3.5 - 5.1 mmol/L  4.3  4.5   Chloride 98 - 111 mmol/L  104  109   CO2 22 - 32 mmol/L  24  28   Calcium  8.6 - 10.3 mg/dL 8.6  9.2  8.5   Total Protein 6.0 - 8.3 g/dL   6.6   Total Bilirubin 0.2 - 1.2 mg/dL   0.4   Alkaline Phos 39 - 117 U/L   84   AST 0 - 37 U/L   37   ALT 0 - 53 U/L   38       Iron /TIBC/Ferritin/ %Sat    Component Value Date/Time   IRON  52 04/23/2024 0942   TIBC 454 (H) 04/23/2024 0942   FERRITIN 12 (L) 04/23/2024 0942   IRONPCTSAT 12 (L) 04/23/2024 2725      RADIOGRAPHIC STUDIES: I have personally reviewed the radiological images as listed and agreed with the findings in the report. No results found.

## 2024-05-02 NOTE — Telephone Encounter (Signed)
 Pharmacy Patient Advocate Encounter  Received notification from [facility] Medicaid that Prior Authorization for Calcitriol  0.25MCG capsules has been DENIED.  Full denial letter will be uploaded to the media tab. See denial reason below.  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 It is FDA approved for Hypocalcemia - Hypoparathyroidism, Postsurgical or idiopathic; Hypocalcemia - Pseudohypoparathyroidism; Hypocalcemia - Renal dialysis (Chronic); Plaque psoriasis (Mild and Moderate); Secondary hyperparathyroidism in members 34 years of age or older.  PA #/Case ID/Reference #: [id number]

## 2024-05-07 ENCOUNTER — Inpatient Hospital Stay

## 2024-05-07 VITALS — BP 132/85 | HR 71 | Temp 98.6°F | Resp 18

## 2024-05-07 DIAGNOSIS — D508 Other iron deficiency anemias: Secondary | ICD-10-CM

## 2024-05-07 DIAGNOSIS — D509 Iron deficiency anemia, unspecified: Secondary | ICD-10-CM | POA: Diagnosis not present

## 2024-05-07 MED ORDER — IRON SUCROSE 20 MG/ML IV SOLN
200.0000 mg | Freq: Once | INTRAVENOUS | Status: AC
Start: 2024-05-07 — End: 2024-05-07
  Administered 2024-05-07: 200 mg via INTRAVENOUS

## 2024-05-07 NOTE — Progress Notes (Signed)
Refused post observation. Aware of risks. Vitals stable at discharge.

## 2024-05-07 NOTE — Patient Instructions (Signed)

## 2024-05-09 ENCOUNTER — Encounter: Payer: Self-pay | Admitting: Oncology

## 2024-05-13 ENCOUNTER — Telehealth: Payer: Self-pay | Admitting: Pharmacist

## 2024-05-13 NOTE — Telephone Encounter (Signed)
 Appeal has been submitted for calcitriol . Will advise when response is received, please be advised that most companies may take 30 days to make a decision. Appeal letter and supporting documentation have been faxed to [phone] on 05/13/2024 @2 :57 pm.  Thank you, Dene Fines, PharmD Clinical Pharmacist  [facility]  Direct Dial: [phone]

## 2024-05-14 ENCOUNTER — Encounter: Payer: Self-pay | Admitting: Oncology

## 2024-05-14 ENCOUNTER — Inpatient Hospital Stay

## 2024-05-14 ENCOUNTER — Other Ambulatory Visit (HOSPITAL_COMMUNITY): Payer: Self-pay

## 2024-05-14 VITALS — BP 152/94 | HR 78 | Temp 98.3°F | Resp 18

## 2024-05-14 DIAGNOSIS — D508 Other iron deficiency anemias: Secondary | ICD-10-CM

## 2024-05-14 DIAGNOSIS — D509 Iron deficiency anemia, unspecified: Secondary | ICD-10-CM | POA: Diagnosis not present

## 2024-05-14 MED ORDER — SODIUM CHLORIDE 0.9% FLUSH
10.0000 mL | Freq: Once | INTRAVENOUS | Status: AC | PRN
  Administered 2024-05-14: 10 mL
  Filled 2024-05-14: qty 10

## 2024-05-14 MED ORDER — IRON SUCROSE 20 MG/ML IV SOLN
200.0000 mg | Freq: Once | INTRAVENOUS | Status: AC
Start: 2024-05-14 — End: 2024-05-14
  Administered 2024-05-14: 200 mg via INTRAVENOUS
  Filled 2024-05-14: qty 10

## 2024-05-14 NOTE — Telephone Encounter (Signed)
 Patient now has traditional Medicaid coverage. Med covered by plan. Daune Eric, can you please contact pharmacy with updated pharmacy info: [place] [facility]?  Recipient [id number] M

## 2024-05-14 NOTE — Telephone Encounter (Signed)
 Medication covered under patient's newest insurance plan.

## 2024-05-21 ENCOUNTER — Inpatient Hospital Stay

## 2024-05-21 NOTE — Progress Notes (Signed)
 Patient here today for venofer  infusion. Upon arrival, pt bp 204/108. Pt denies symptoms of hypertension and says he feels fine other than his chronic pain. He was unable to take his pain medicine as usual in order to drive to this appt today. Wilhelmenia Harada, MD notified of pts bp and pain. Per Wilhelmenia Harada, MD recheck in 15 min. After 15 min bp 179/101. Per Wilhelmenia Harada, MD treatment to be held today. Pt educated to monitor bp at home and to seek emergency care if hypertension persists after taking his medication. Pt also educated to seek emergency care if he becomes symptomatic of hypertension. Pt verified understanding. Pt rescheduled his next appt and was stable at discharge.

## 2024-05-23 ENCOUNTER — Encounter: Payer: Self-pay | Admitting: Physical Medicine & Rehabilitation

## 2024-05-23 ENCOUNTER — Encounter: Attending: Physical Medicine & Rehabilitation | Admitting: Physical Medicine & Rehabilitation

## 2024-05-23 ENCOUNTER — Encounter: Payer: Self-pay | Admitting: Oncology

## 2024-05-23 VITALS — BP 161/104 | HR 69 | Ht 70.0 in | Wt 190.0 lb

## 2024-05-23 DIAGNOSIS — M545 Low back pain, unspecified: Secondary | ICD-10-CM | POA: Diagnosis present

## 2024-05-23 DIAGNOSIS — Z79899 Other long term (current) drug therapy: Secondary | ICD-10-CM | POA: Diagnosis present

## 2024-05-23 DIAGNOSIS — G894 Chronic pain syndrome: Secondary | ICD-10-CM | POA: Diagnosis present

## 2024-05-23 DIAGNOSIS — M25551 Pain in right hip: Secondary | ICD-10-CM | POA: Insufficient documentation

## 2024-05-23 DIAGNOSIS — G8929 Other chronic pain: Secondary | ICD-10-CM | POA: Insufficient documentation

## 2024-05-23 DIAGNOSIS — Z5181 Encounter for therapeutic drug level monitoring: Secondary | ICD-10-CM | POA: Insufficient documentation

## 2024-05-23 MED ORDER — OXYCODONE HCL ER 20 MG PO T12A: 20.0000 mg | EXTENDED_RELEASE_TABLET | Freq: Two times a day (BID) | ORAL | 0 refills | Status: DC

## 2024-05-23 MED ORDER — OXYCODONE-ACETAMINOPHEN 7.5-325 MG PO TABS: 1.0000 | ORAL_TABLET | Freq: Three times a day (TID) | ORAL | 0 refills | Status: DC | PRN
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
## 2024-05-24 ENCOUNTER — Telehealth: Payer: Self-pay | Admitting: *Deleted

## 2024-05-24 NOTE — Telephone Encounter (Signed)
 Prior auth submitted to [employer] for Oxycontin  Confirmation #:[id number] W  Prior auth submitted to [employer] for Oxycodone  Acetaminophen  Confirmation #:[id number] W

## 2024-05-25 ENCOUNTER — Other Ambulatory Visit: Payer: Self-pay | Admitting: Physical Medicine & Rehabilitation

## 2024-05-27 ENCOUNTER — Other Ambulatory Visit: Payer: Self-pay | Admitting: Internal Medicine

## 2024-05-28 ENCOUNTER — Inpatient Hospital Stay: Attending: Oncology

## 2024-05-28 VITALS — BP 169/99 | HR 74 | Temp 99.2°F | Resp 16

## 2024-05-28 DIAGNOSIS — D508 Other iron deficiency anemias: Secondary | ICD-10-CM

## 2024-05-28 DIAGNOSIS — D509 Iron deficiency anemia, unspecified: Secondary | ICD-10-CM | POA: Insufficient documentation

## 2024-05-28 LAB — TOXASSURE SELECT,+ANTIDEPR,UR

## 2024-05-28 MED ORDER — IRON SUCROSE 20 MG/ML IV SOLN
200.0000 mg | Freq: Once | INTRAVENOUS | Status: AC
  Administered 2024-05-28: 200 mg via INTRAVENOUS

## 2024-05-28 NOTE — Progress Notes (Signed)
 Declined post-observation. Aware of risks. Vitals stable at discharge.

## 2024-05-28 NOTE — Telephone Encounter (Signed)
 Prior Approval #:[id number] PA Type:PHARMACY Recipient:Jemmie R SAVAGERecipient VF:[id number] MBilling Provider:Billing Provider AC:[id number] Provider Name:[facility] Provider FU:[id number] Submission Date:05/30/2025Status:APPROVED Effective Begin Date:05/30/2025Effective End Date:11/20/2024

## 2024-05-28 NOTE — Patient Instructions (Signed)

## 2024-05-29 ENCOUNTER — Other Ambulatory Visit (HOSPITAL_COMMUNITY): Payer: Self-pay

## 2024-05-29 ENCOUNTER — Encounter: Payer: Self-pay | Admitting: Oncology

## 2024-05-29 ENCOUNTER — Telehealth: Payer: Self-pay

## 2024-05-29 NOTE — Telephone Encounter (Signed)
 Pharmacy Patient Advocate Encounter   Received notification from Onbase that prior authorization for Qutiapine  is required/requested.   Insurance verification completed.   The patient is insured through [facility] MEDICAID .   Per test claim: PA required; PA submitted to above mentioned insurance via CoverMyMeds Key/confirmation #/EOC [facility] Status is pending

## 2024-05-29 NOTE — Telephone Encounter (Signed)
 Pharmacy Patient Advocate Encounter   Received notification from Patient Pharmacy that prior authorization for Quetiapine  25 tabs is required/requested.   Insurance verification completed.   The patient is insured through [employer] .   Per test claim: The current 30 day co-pay is, $10.00.  No PA needed at this time. This test claim was processed through [facility]- copay amounts may vary at other pharmacies due to pharmacy/plan contracts, or as the patient moves through the different stages of their insurance plan.

## 2024-06-12 ENCOUNTER — Encounter: Payer: Self-pay | Admitting: Oncology

## 2024-06-13 ENCOUNTER — Encounter: Payer: Self-pay | Admitting: Oncology

## 2024-06-17 ENCOUNTER — Other Ambulatory Visit: Payer: Self-pay | Admitting: Orthopedic Surgery

## 2024-06-20 ENCOUNTER — Telehealth: Payer: Self-pay | Admitting: Orthopaedic Surgery

## 2024-06-20 NOTE — Telephone Encounter (Signed)
Resent form

## 2024-06-20 NOTE — Telephone Encounter (Signed)
 Harold Smith from [employer] called. Says the clearance form was not filled out all the way. Needs it to be completed. The fax # [phone]

## 2024-07-09 ENCOUNTER — Ambulatory Visit: Payer: Self-pay | Admitting: *Deleted

## 2024-07-09 ENCOUNTER — Telehealth: Payer: Self-pay | Admitting: Orthopedic Surgery

## 2024-07-09 NOTE — Telephone Encounter (Addendum)
-----   Message from Murray Collier sent at 06/26/2024  3:35 PM EDT ----- Walterine, could we please send warning letter about ETOH, thanks! ----- Message ----- From: Interface, [facility] Lab Results In Sent: 05/28/2024   5:36 AM EDT To: Murray Collier, MD  Letter sent with formal warning  through [facility].

## 2024-07-09 NOTE — Telephone Encounter (Signed)
 Received vm from patient stating that his dental office received a fax back but the form wasn't completed entirely. Please call patient to further discuss as he is wanting to schedule a dentist appt. Callback [phone]

## 2024-07-10 NOTE — Telephone Encounter (Signed)
 Patient aware I left message this morning with his dentist office

## 2024-07-11 ENCOUNTER — Encounter: Payer: Self-pay | Admitting: Oncology

## 2024-07-11 DIAGNOSIS — I10 Essential (primary) hypertension: Secondary | ICD-10-CM | POA: Diagnosis not present

## 2024-07-11 DIAGNOSIS — G8921 Chronic pain due to trauma: Secondary | ICD-10-CM | POA: Diagnosis not present

## 2024-07-12 ENCOUNTER — Other Ambulatory Visit: Payer: Self-pay | Admitting: Internal Medicine

## 2024-07-12 ENCOUNTER — Encounter: Payer: Self-pay | Admitting: Internal Medicine

## 2024-07-13 ENCOUNTER — Other Ambulatory Visit: Payer: Self-pay | Admitting: Orthopedic Surgery

## 2024-07-14 ENCOUNTER — Encounter: Payer: Self-pay | Admitting: Oncology

## 2024-07-15 ENCOUNTER — Encounter: Payer: Self-pay | Admitting: Internal Medicine

## 2024-07-15 ENCOUNTER — Ambulatory Visit (INDEPENDENT_AMBULATORY_CARE_PROVIDER_SITE_OTHER): Payer: 59 | Admitting: Internal Medicine

## 2024-07-15 ENCOUNTER — Ambulatory Visit: Payer: Self-pay | Admitting: Internal Medicine

## 2024-07-15 VITALS — BP 118/84 | HR 93 | Temp 98.3°F | Ht 70.5 in | Wt 200.0 lb

## 2024-07-15 DIAGNOSIS — G894 Chronic pain syndrome: Secondary | ICD-10-CM | POA: Diagnosis not present

## 2024-07-15 DIAGNOSIS — Z0000 Encounter for general adult medical examination without abnormal findings: Secondary | ICD-10-CM | POA: Diagnosis not present

## 2024-07-15 DIAGNOSIS — I10 Essential (primary) hypertension: Secondary | ICD-10-CM

## 2024-07-15 DIAGNOSIS — E291 Testicular hypofunction: Secondary | ICD-10-CM

## 2024-07-15 DIAGNOSIS — F39 Unspecified mood [affective] disorder: Secondary | ICD-10-CM

## 2024-07-15 DIAGNOSIS — F1120 Opioid dependence, uncomplicated: Secondary | ICD-10-CM | POA: Diagnosis not present

## 2024-07-15 LAB — CBC
HCT: 41.8 % (ref 39.0–52.0)
Hemoglobin: 13.7 g/dL (ref 13.0–17.0)
MCHC: 32.8 g/dL (ref 30.0–36.0)
MCV: 94.1 fl (ref 78.0–100.0)
Platelets: 253 K/uL (ref 150.0–400.0)
RBC: 4.44 Mil/uL (ref 4.22–5.81)
RDW: 17 % — ABNORMAL HIGH (ref 11.5–15.5)
WBC: 6.1 K/uL (ref 4.0–10.5)

## 2024-07-15 LAB — PSA: PSA: 1.32 ng/mL (ref 0.10–4.00)

## 2024-07-15 LAB — COMPREHENSIVE METABOLIC PANEL WITH GFR
ALT: 19 U/L (ref 0–53)
AST: 18 U/L (ref 0–37)
Albumin: 4.1 g/dL (ref 3.5–5.2)
Alkaline Phosphatase: 63 U/L (ref 39–117)
BUN: 18 mg/dL (ref 6–23)
CO2: 25 meq/L (ref 19–32)
Calcium: 8.9 mg/dL (ref 8.4–10.5)
Chloride: 107 meq/L (ref 96–112)
Creatinine, Ser: 0.98 mg/dL (ref 0.40–1.50)
GFR: 83.99 mL/min (ref >60.00)
Glucose, Bld: 75 mg/dL (ref 70–99)
Potassium: 4.1 meq/L (ref 3.5–5.1)
Sodium: 139 meq/L (ref 135–145)
Total Bilirubin: 0.4 mg/dL (ref 0.2–1.2)
Total Protein: 6.7 g/dL (ref 6.0–8.3)

## 2024-07-15 NOTE — Assessment & Plan Note (Signed)
 Followed by pain management.

## 2024-07-15 NOTE — Assessment & Plan Note (Signed)
 Now seeing pain specialist

## 2024-07-15 NOTE — Assessment & Plan Note (Signed)
 Reactive depression from pain, disability, etc On the duloxetine  58 Asked him to wean off the quetiapine --and use gabapentin /trazodone  at night

## 2024-07-15 NOTE — Assessment & Plan Note (Signed)
 BP Readings from Last 3 Encounters:  07/12/24 118/84  05/28/24 (!) 169/99  05/23/24 (!) 161/104   Some orthostatic symptoms If ongoing issues, would decrease lisinopril  20

## 2024-07-15 NOTE — Progress Notes (Signed)
 Subjective:    Patient ID: Harold Smith, male    DOB: May 20, 1966, 58 y.o.   MRN: [id number]  HPI Here for Welcome to Medicare visit and follow up of chronic health conditions Reviewed advanced directives Reviewed other doctors--see AMW note for other details  He feels the testosterone  has helped some No trouble with doing the injections  Now seeing pain doctor On oxycontin  and meloxicam  Prn oxycodone   Still having sleep issues On gabapentin  1200 at bedtime and the quetiapine  Discussed that we should try to stop the quetiapine  Has trazodone --but not taking it consistently  Did finally get his disability Now Medicare and Medicaid  No chest pain or SOB Did have black out about a month ago Was in kitchen and walking back to bedroom with food--woke up on floor Felt fine after--didn't get evaluation Some orthostatic dizziness----but only if he gets up quick (like rushing to bathroom--wears diaper at night and at times during the day)  Still depressed daily--mostly related to pain and limitations Financial issues also No suicidal thoughts Enjoys family, etc  Current Outpatient Medications on File Prior to Visit  Medication Sig Dispense Refill   alendronate  (FOSAMAX ) 70 MG tablet Take 1 tablet (70 mg total) by mouth once a week. Take with a full glass of water  on an empty stomach. 13 tablet 3   baclofen  (LIORESAL ) 10 MG tablet Take 10 mg by mouth 3 (three) times daily.     calcitRIOL  (ROCALTROL ) 0.25 MCG capsule Take 1 capsule (0.25 mcg total) by mouth daily. 90 capsule 2   CALCIUM  PO Take 1 tablet by mouth daily.     CAPVAXIVE 0.5 ML injection      cetirizine  (ZYRTEC ) 10 MG tablet Take 1 tablet (10 mg total) by mouth daily. 90 tablet 3   Cyanocobalamin  (VITAMIN B-12 PO) Take 1 tablet by mouth daily.     dicyclomine  (BENTYL ) 10 MG capsule Take 1 to 2 capsules as needed for abdominal cramps and diarrhea every 6 to 8 hours 240 capsule 3   DULoxetine  (CYMBALTA ) 60 MG  capsule TAKE 1 CAPSULE BY MOUTH ONCE DAILY 90 capsule 3   ferrous sulfate  (FEROSUL) 325 (65 FE) MG tablet TAKE ONE TABLET EACH MORNING WITH BREAKFAST 30 tablet 11   fluticasone  (FLONASE ) 50 MCG/ACT nasal spray Place 2 sprays into both nostrils daily as needed for allergies or rhinitis. 16 g 11   gabapentin  (NEURONTIN ) 300 MG capsule TAKE 1 CAPSULE BY MOUTH TWICE DAILY AND TAKE 4 CAPSULES AT BEDTIME 180 capsule 11   hydrochlorothiazide  (HYDRODIURIL ) 25 MG tablet TAKE 1 TABLET BY MOUTH DAILY 90 tablet 3   lipase/protease/amylase (CREON ) 36000 UNITS CPEP capsule Take 2 capsules (72,000 Units total) by mouth 3 (three) times daily with meals. May also take 1 capsule (36,000 Units total) as needed (with snacks). 240 capsule 5   lisinopril  (ZESTRIL ) 20 MG tablet TAKE 1 TABLET BY MOUTH DAILY 90 tablet 3   LUER LOCK SAFETY SYRINGES 22G X 1-1/2 3 ML MISC every 14 (fourteen) days.     meloxicam  (MOBIC ) 15 MG tablet TAKE 1 TABLET BY MOUTH DAILY AS NEEDED FOR PAIN (Patient taking differently: Take 15 mg by mouth daily. for pain) 90 tablet 1   methocarbamol  (ROBAXIN ) 500 MG tablet TAKE 1 TABLET BY MOUTH EVERY 8 HOURS AS NEEDED FOR PAIN, MUSCLE SPASMS 50 tablet 0   Multiple Vitamin (MULTIVITAMIN) tablet Take 1 tablet by mouth daily.     naloxone  (NARCAN ) nasal spray 4 mg/0.1 mL In the event  of opiod overdose 1 each 0   Needle, Disp, (HYPODERMIC NEEDLE 22GX1-1/2) 22G X 1-1/2 MISC 1 each by Does not apply route every 14 (fourteen) days. 2 each 12   NEEDLE, DISP, 18 G (BD DISP NEEDLES) 18G X 1-1/2 MISC 2 each by Does not apply route every 14 (fourteen) days. 12 each 6   omeprazole  (PRILOSEC) 40 MG capsule Take 1 capsule (40 mg total) by mouth 2 (two) times daily before a meal. 180 capsule 3   oxyCODONE -acetaminophen  (PERCOCET) 7.5-325 MG tablet Take 1 tablet by mouth every 8 (eight) hours as needed for severe pain (pain score 7-10). 120 tablet 0   OXYCONTIN  20 MG 12 hr tablet TAKE 1 TABLET BY MOUTH EVERY 12 HOURS  60 tablet 0   QUEtiapine  (SEROQUEL ) 25 MG tablet TAKE 1 TO 2 TABLETS BY MOUTH AT BEDTIME AS NEEDED FOR SLEEP 60 tablet 0   Sodium Fluoride (PREVIDENT 5000 BOOSTER PLUS DT) Place 1 application  onto teeth 2 (two) times daily.     Syringe, Disposable, 3 ML MISC 1 each by Does not apply route every 14 (fourteen) days. 6 each 6   testosterone  cypionate (DEPOTESTOSTERONE CYPIONATE) 200 MG/ML injection Inject 1 mL (200 mg total) into the muscle every 14 (fourteen) days. 10 mL 3   traZODone  (DESYREL ) 50 MG tablet Take 1-2 tablets (50-100 mg total) by mouth at bedtime as needed for sleep. 180 tablet 1   Vitamin D , Ergocalciferol , (DRISDOL ) 1.25 MG (50000 UNIT) CAPS capsule Take 1 capsule (50,000 Units total) by mouth every 7 (seven) days. 13 capsule 3   No current facility-administered medications on file prior to visit.    No Known Allergies  Past Medical History:  Diagnosis Date   Allergic rhinitis due to pollen    Anemia    Anxiety    Chronic pain    Colon polyps    Depression    Diverticulitis large intestine    Erosive esophagitis    GERD (gastroesophageal reflux disease)    HTN (hypertension)    Irritable bowel syndrome    Metabolic bone disease 09/01/2022   Obesity    Osteoarthritis, knee    Sleep apnea    does not use cpap, pt states he no longer needed CPAP due to weight loss   Sleep disturbance    Vitamin D  deficiency 09/01/2022    Past Surgical History:  Procedure Laterality Date   ABDOMINAL EXPOSURE N/A 03/06/2023   Procedure: ABDOMINAL EXPOSURE;  Surgeon: Gretta Lonni PARAS, MD;  Location: [facility] OR;  Service: Vascular;  Laterality: N/A;   ACHILLES TENDON REPAIR Right 12/26/2006   ANTERIOR LUMBAR FUSION N/A 03/06/2023   Procedure: L5-S1 ANTERIOR LUMBAR FUSION 1 LEVEL;  Surgeon: Georgina Ozell LABOR, MD;  [facility] OR;  Service: Orthopedics;  Laterality: N/A;   BIOPSY  08/29/2023   Procedure: BIOPSY;  Surgeon: Unk Corinn Skiff, MD;  Location: [facility] ENDOSCOPY;  Service:  Gastroenterology;;   CATARACT EXTRACTION W/PHACO Left 07/20/2021   Procedure: CATARACT EXTRACTION PHACO AND INTRAOCULAR LENS PLACEMENT (IOC) LEFT 2.11 00:24.0;  Surgeon: Jaye Fallow, MD;  Location: [facility] SURGERY CNTR;  Service: Ophthalmology;  Laterality: Left;  sleep apnea   CATARACT EXTRACTION W/PHACO Right 08/03/2021   Procedure: CATARACT EXTRACTION PHACO AND INTRAOCULAR LENS PLACEMENT (IOC) RIGHT;  Surgeon: Jaye Fallow, MD;  Location: [facility] SURGERY [facility]TR;  Service: Ophthalmology;  Laterality: Right;  3.19 0:33.0   CHOLECYSTECTOMY     COLON RESECTION Left 12/26/2004   due to diverticular disease at [facility]  COLONOSCOPY  2013?   COLONOSCOPY N/A 11/30/2022   Procedure: COLONOSCOPY;  Surgeon: Unk Corinn Skiff, MD;  Location: [facility] ENDOSCOPY;  Service: Gastroenterology;  Laterality: N/A;   COLONOSCOPY WITH PROPOFOL  N/A 05/22/2018   Procedure: COLONOSCOPY WITH PROPOFOL ;  Surgeon: Unk Corinn Skiff, MD;  Location: [facility] ENDOSCOPY;  Service: Gastroenterology;  Laterality: N/A;   COLONOSCOPY WITH PROPOFOL  N/A 10/22/2020   Procedure: COLONOSCOPY WITH PROPOFOL ;  Surgeon: Unk Corinn Skiff, MD;  Location: [facility] SURGERY [facility]TR;  Service: Endoscopy;  Laterality: N/A;  priority 4   COLONOSCOPY WITH PROPOFOL  N/A 11/10/2021   Procedure: COLONOSCOPY WITH PROPOFOL ;  Surgeon: Unk Corinn Skiff, MD;  Location: [facility] ENDOSCOPY;  Service: Gastroenterology;  Laterality: N/A;   COLONOSCOPY WITH PROPOFOL  N/A 06/16/2022   Procedure: COLONOSCOPY WITH PROPOFOL ;  Surgeon: Unk Corinn Skiff, MD;  Location: [facility] SURGERY CNTR;  Service: Endoscopy;  Laterality: N/A;   COLONOSCOPY WITH PROPOFOL  N/A 11/29/2022   Procedure: COLONOSCOPY WITH PROPOFOL ;  Surgeon: Unk Corinn Skiff, MD;  Location: [facility] ENDOSCOPY;  Service: Gastroenterology;  Laterality: N/A;   DRUG INDUCED ENDOSCOPY N/A 06/10/2019   Procedure: DRUG INDUCED SLEEP ENDOSCOPY;  Surgeon: Mable Lenis, MD;  Location: [facility];   Service: ENT;  Laterality: N/A;   ESOPHAGOGASTRODUODENOSCOPY (EGD) WITH PROPOFOL  N/A 06/16/2022   Procedure: ESOPHAGOGASTRODUODENOSCOPY (EGD) WITH PROPOFOL ;  Surgeon: Unk Corinn Skiff, MD;  Location: [facility] SURGERY [facility];  Service: Endoscopy;  Laterality: N/A;   ESOPHAGOGASTRODUODENOSCOPY (EGD) WITH PROPOFOL  N/A 05/24/2023   Procedure: ESOPHAGOGASTRODUODENOSCOPY (EGD) WITH PROPOFOL ;  Surgeon: Unk Corinn Skiff, MD;  Location: [facility] ENDOSCOPY;  Service: Gastroenterology;  Laterality: N/A;   ESOPHAGOGASTRODUODENOSCOPY (EGD) WITH PROPOFOL  N/A 08/29/2023   Procedure: ESOPHAGOGASTRODUODENOSCOPY (EGD) WITH PROPOFOL ;  Surgeon: Unk Corinn Skiff, MD;  Location: [facility] ENDOSCOPY;  Service: Gastroenterology;  Laterality: N/A;   GASTRIC BYPASS N/A 12/27/2007   HARDWARE REMOVAL Right 06/22/2023   Procedure: HARDWARE REMOVAL;  Surgeon: Celena Sharper, MD;  Location: [facility] OR;  Service: Orthopedics;  Laterality: Right;   HARDWARE REMOVAL N/A 01/23/2024   Procedure: REMOVAL OF POSTERIOR SPINAL INSTRUMENTATION;  Surgeon: Georgina Sharper LABOR, MD;  [facility] OR;  Service: Orthopedics;  Laterality: N/A;   HIATAL HERNIA REPAIR  09/25/2014   at [facility]   KNEE ARTHROSCOPY Right 06/22/2023   Procedure: ARTHROSCOPY KNEE;  Surgeon: Celena Sharper, MD;  Location: [facility] OR;  Service: Orthopedics;  Laterality: Right;   ORIF FEMUR FRACTURE Right 08/30/2022   Procedure: OPEN REDUCTION INTERNAL FIXATION (ORIF) DISTAL FEMUR FRACTURE;  Surgeon: Celena Sharper, MD;  [facility] OR;  Service: Orthopedics;  Laterality: Right;   POLYPECTOMY  10/22/2020   Procedure: POLYPECTOMY;  Surgeon: Unk Corinn Skiff, MD;  Location: [facility] SURGERY [facility]TR;  Service: Endoscopy;;   POLYPECTOMY  06/16/2022   Procedure: POLYPECTOMY;  Surgeon: Unk Corinn Skiff, MD;  Location: [facility] SURGERY [facility];  Service: Endoscopy;;   ROTATOR CUFF REPAIR Right    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   TOTAL KNEE ARTHROPLASTY Right 09/19/2023    Procedure: RIGHT TOTAL KNEE ARTHROPLASTY;  Surgeon: Vernetta Lonni GRADE, MD;  Location: [facility] OR;  Service: Orthopedics;  Laterality: Right;    Family History  Problem Relation Age of Onset   Hypertension Mother    Diabetes Mother    Kidney disease Mother    Heart disease Mother    Colon cancer Father 18   Asthma Sister    Obesity Sister    Stroke Sister    Obesity Brother    Diabetes Other    Heart disease Other  Hypertension Other    Kidney disease Other    Cancer Paternal Uncle        unk type, possible prostate   Lung cancer Paternal Grandmother     Social History   Socioeconomic History   Marital status: Single    Spouse name: Not on file   Number of children: 3   Years of education: Not on file   Highest education level: Some college, no degree  Occupational History   Occupation: Hydrographic surveyor    End: 89/68/7977  Tobacco Use   Smoking status: Former    Types: Cigars    Passive exposure: Past   Smokeless tobacco: Never   Tobacco comments:    occasional cigar  Vaping Use   Vaping status: Never Used  Substance and Sexual Activity   Alcohol use: Yes    Comment: Occasionally   Drug use: No   Sexual activity: Yes  Other Topics Concern   Not on file  Social History Narrative   Jehovah's witness--no blood products   No formal living will   Daughter Dela should be health care POA   Would accept resuscitation   Would accept feeding tube---and then leave it up to family   Social Drivers of Health   Financial Resource Strain: Medium Risk (07/14/2024)   Overall Financial Resource Strain (CARDIA)    Difficulty of Paying Living Expenses: Somewhat hard  Food Insecurity: Food Insecurity Present (07/14/2024)   Hunger Vital Sign    Worried About Running Out of Food in the Last Year: Sometimes true    Ran Out of Food in the Last Year: Sometimes true  Transportation Needs: No Transportation Needs (07/14/2024)   PRAPARE - Scientist, research (physical sciences) (Medical): No    Lack of Transportation (Non-Medical): No  Physical Activity: Inactive (07/14/2024)   Exercise Vital Sign    Days of Exercise per Week: 0 days    Minutes of Exercise per Session: Not on file  Stress: Stress Concern Present (07/14/2024)   [employer] of Occupational Health - Occupational Stress Questionnaire    Feeling of Stress: Very much  Social Connections: Socially Isolated (07/14/2024)   Social Connection and Isolation Panel    Frequency of Communication with Friends and Family: More than three times a week    Frequency of Social Gatherings with Friends and Family: Once a week    Attends Religious Services: Never    Database administrator or Organizations: No    Attends Engineer, structural: Not on file    Marital Status: Divorced  Intimate Partner Violence: Not At Risk (07/12/2024)   Humiliation, Afraid, Rape, and Kick questionnaire    Fear of Current or Ex-Partner: No    Emotionally Abused: No    Physically Abused: No    Sexually Abused: No      Review of Systems Appetite is good Weight is up due to not able to work out--trying to figure out what he can do for exercise Heartburn mostly controlled with daily prilosec. No dysphagia Bowels are okay Voids okay--just trouble with incontinence (urgency)     Objective:   Physical Exam Constitutional:      Appearance: Normal appearance.  HENT:     Mouth/Throat:     Pharynx: No oropharyngeal exudate or posterior oropharyngeal erythema.  Cardiovascular:     Rate and Rhythm: Normal rate and regular rhythm.     Pulses: Normal pulses.     Heart sounds: No murmur heard.  No gallop.     Comments: Occ extra beats Pulmonary:     Effort: Pulmonary effort is normal.     Breath sounds: Normal breath sounds. No wheezing or rales.  Abdominal:     Palpations: Abdomen is soft.     Tenderness: There is no abdominal tenderness.  Musculoskeletal:     Cervical back: Neck supple.     Right  lower leg: No edema.     Left lower leg: No edema.  Lymphadenopathy:     Cervical: No cervical adenopathy.  Skin:    Comments: Scattered apparent cysts on calves--mostly right  Neurological:     Mental Status: He is alert.            Assessment & Plan:    Subjective:    Rhodes Asaph Serena is a 58 y.o. male who presents for a Welcome to Medicare exam.   Questions were answered during telephone call with patient 07/12/24  Cardiac Risk Factors include: advanced age (>83men, >50 women);male gender;hypertension;sedentary lifestyle     Objective:    Today's Vitals   07/12/24 1437  PainSc: 7    There is no height or weight on file to calculate BMI.  Medications Outpatient Encounter Medications as of 07/15/2024  Medication Sig   alendronate  (FOSAMAX ) 70 MG tablet Take 1 tablet (70 mg total) by mouth once a week. Take with a full glass of water  on an empty stomach.   baclofen  (LIORESAL ) 10 MG tablet Take 10 mg by mouth 3 (three) times daily.   calcitRIOL  (ROCALTROL ) 0.25 MCG capsule Take 1 capsule (0.25 mcg total) by mouth daily.   CALCIUM  PO Take 1 tablet by mouth daily.   CAPVAXIVE 0.5 ML injection    cephALEXin  (KEFLEX ) 500 MG capsule Take 1 capsule (500 mg total) by mouth 4 (four) times daily.   cetirizine  (ZYRTEC ) 10 MG tablet Take 1 tablet (10 mg total) by mouth daily.   Cyanocobalamin  (VITAMIN B-12 PO) Take 1 tablet by mouth daily.   dicyclomine  (BENTYL ) 10 MG capsule Take 1 to 2 capsules as needed for abdominal cramps and diarrhea every 6 to 8 hours   DULoxetine  (CYMBALTA ) 60 MG capsule TAKE 1 CAPSULE BY MOUTH ONCE DAILY   ferrous sulfate  (FEROSUL) 325 (65 FE) MG tablet TAKE ONE TABLET EACH MORNING WITH BREAKFAST   fluticasone  (FLONASE ) 50 MCG/ACT nasal spray Place 2 sprays into both nostrils daily as needed for allergies or rhinitis.   gabapentin  (NEURONTIN ) 300 MG capsule TAKE 1 CAPSULE BY MOUTH TWICE DAILY AND TAKE 4 CAPSULES AT BEDTIME   hydrochlorothiazide   (HYDRODIURIL ) 25 MG tablet TAKE 1 TABLET BY MOUTH DAILY   lipase/protease/amylase (CREON ) 36000 UNITS CPEP capsule Take 2 capsules (72,000 Units total) by mouth 3 (three) times daily with meals. May also take 1 capsule (36,000 Units total) as needed (with snacks).   lisinopril  (ZESTRIL ) 20 MG tablet TAKE 1 TABLET BY MOUTH DAILY   LUER LOCK SAFETY SYRINGES 22G X 1-1/2 3 ML MISC every 14 (fourteen) days. (Patient not taking: Reported on 05/23/2024)   meloxicam  (MOBIC ) 15 MG tablet TAKE 1 TABLET BY MOUTH DAILY AS NEEDED FOR PAIN (Patient taking differently: Take 15 mg by mouth daily. for pain)   Multiple Vitamin (MULTIVITAMIN) tablet Take 1 tablet by mouth daily.   naloxone  (NARCAN ) nasal spray 4 mg/0.1 mL In the event of opiod overdose   Needle, Disp, (HYPODERMIC NEEDLE 22GX1-1/2) 22G X 1-1/2 MISC 1 each by Does not apply route every 14 (fourteen) days. (Patient not  taking: Reported on 05/23/2024)   NEEDLE, DISP, 18 G (BD DISP NEEDLES) 18G X 1-1/2 MISC 2 each by Does not apply route every 14 (fourteen) days. (Patient not taking: Reported on 05/23/2024)   omeprazole  (PRILOSEC) 40 MG capsule Take 1 capsule (40 mg total) by mouth 2 (two) times daily before a meal.   oxyCODONE -acetaminophen  (PERCOCET) 7.5-325 MG tablet Take 1 tablet by mouth every 8 (eight) hours as needed for severe pain (pain score 7-10).   OXYCONTIN  20 MG 12 hr tablet TAKE 1 TABLET BY MOUTH EVERY 12 HOURS   Sodium Fluoride (PREVIDENT 5000 BOOSTER PLUS DT) Place 1 application  onto teeth 2 (two) times daily.   Syringe, Disposable, 3 ML MISC 1 each by Does not apply route every 14 (fourteen) days. (Patient not taking: Reported on 05/23/2024)   testosterone  cypionate (DEPOTESTOSTERONE CYPIONATE) 200 MG/ML injection Inject 1 mL (200 mg total) into the muscle every 14 (fourteen) days.   traZODone  (DESYREL ) 50 MG tablet Take 1-2 tablets (50-100 mg total) by mouth at bedtime as needed for sleep.   TWINRIX 720-20 ELU-MCG/ML injection     Vitamin D , Ergocalciferol , (DRISDOL ) 1.25 MG (50000 UNIT) CAPS capsule Take 1 capsule (50,000 Units total) by mouth every 7 (seven) days.   [DISCONTINUED] methocarbamol  (ROBAXIN ) 500 MG tablet TAKE 1 TABLET BY MOUTH EVERY 8 HOURS AS NEEDED FOR PAIN, MUSCLE SPASMS   [DISCONTINUED] QUEtiapine  (SEROQUEL ) 25 MG tablet TAKE 1 TO 2 TABLETS BY MOUTH AT BEDTIME AS NEEDED FOR SLEEP   No facility-administered encounter medications on file as of 07/15/2024.     History: Past Medical History:  Diagnosis Date   Allergic rhinitis due to pollen    Anemia    Anxiety    Chronic pain    Colon polyps    Depression    Diverticulitis large intestine    Erosive esophagitis    GERD (gastroesophageal reflux disease)    HTN (hypertension)    Irritable bowel syndrome    Metabolic bone disease 09/01/2022   Obesity    Osteoarthritis, knee    Sleep apnea    does not use cpap, pt states he no longer needed CPAP due to weight loss   Sleep disturbance    Vitamin D  deficiency 09/01/2022   Past Surgical History:  Procedure Laterality Date   ABDOMINAL EXPOSURE N/A 03/06/2023   Procedure: ABDOMINAL EXPOSURE;  Surgeon: Gretta Lonni PARAS, MD;  Location: [facility] OR;  Service: Vascular;  Laterality: N/A;   ACHILLES TENDON REPAIR Right 12/26/2006   ANTERIOR LUMBAR FUSION N/A 03/06/2023   Procedure: L5-S1 ANTERIOR LUMBAR FUSION 1 LEVEL;  Surgeon: Georgina Ozell LABOR, MD;  Location: [facility] OR;  Service: Orthopedics;  Laterality: N/A;   BIOPSY  08/29/2023   Procedure: BIOPSY;  Surgeon: Unk Corinn Skiff, MD;  Location: [facility] ENDOSCOPY;  Service: Gastroenterology;;   CATARACT EXTRACTION W/PHACO Left 07/20/2021   Procedure: CATARACT EXTRACTION PHACO AND INTRAOCULAR LENS PLACEMENT (IOC) LEFT 2.11 00:24.0;  Surgeon: Jaye Fallow, MD;  Location: [facility] SURGERY [facility];  Service: Ophthalmology;  Laterality: Left;  sleep apnea   CATARACT EXTRACTION W/PHACO Right 08/03/2021   Procedure: CATARACT EXTRACTION PHACO AND INTRAOCULAR LENS  PLACEMENT (IOC) RIGHT;  Surgeon: Jaye Fallow, MD;  Location: [facility];  Service: Ophthalmology;  Laterality: Right;  3.19 0:33.0   CHOLECYSTECTOMY     COLON RESECTION Left 12/26/2004   due to diverticular disease at [facility]   COLONOSCOPY  2013?   COLONOSCOPY N/A 11/30/2022   Procedure: COLONOSCOPY;  Surgeon: Unk Corinn Skiff,  MD;  Location: [facility] ENDOSCOPY;  Service: Gastroenterology;  Laterality: N/A;   COLONOSCOPY WITH PROPOFOL  N/A 05/22/2018   Procedure: COLONOSCOPY WITH PROPOFOL ;  Surgeon: Unk Corinn Skiff, MD;  Location: [facility] ENDOSCOPY;  Service: Gastroenterology;  Laterality: N/A;   COLONOSCOPY WITH PROPOFOL  N/A 10/22/2020   Procedure: COLONOSCOPY WITH PROPOFOL ;  Surgeon: Unk Corinn Skiff, MD;  Location: [facility];  Service: Endoscopy;  Laterality: N/A;  priority 4   COLONOSCOPY WITH PROPOFOL  N/A 11/10/2021   Procedure: COLONOSCOPY WITH PROPOFOL ;  Surgeon: Unk Corinn Skiff, MD;  Location: [facility] ENDOSCOPY;  Service: Gastroenterology;  Laterality: N/A;   COLONOSCOPY WITH PROPOFOL  N/A 06/16/2022   Procedure: COLONOSCOPY WITH PROPOFOL ;  Surgeon: Unk Corinn Skiff, MD;  Location: [facility] SUR[facility];  Service: Endoscopy;  Laterality: N/A;   COLONOSCOPY WITH PROPOFOL  N/A 11/29/2022   Procedure: COLONOSCOPY WITH PROPOFOL ;  Surgeon: Unk Corinn Skiff, MD;  Location: [facility] ENDOSCOPY;  Service: Gastroenterology;  Laterality: N/A;   DRUG INDUCED ENDOSCOPY N/A 06/10/2019   Procedure: DRUG INDUCED SLEEP ENDOSCOPY;  Surgeon: Mable Lenis, MD;  Location: [facility];  Service: ENT;  Laterality: N/A;   ESOPHAGOGASTRODUODENOSCOPY (EGD) WITH PROPOFOL  N/A 06/16/2022   Procedure: ESOPHAGOGASTRODUODENOSCOPY (EGD) WITH PROPOFOL ;  Surgeon: Unk Corinn Skiff, MD;  Location: [facility] SURGERY [facility];  Service: Endoscopy;  Laterality: N/A;   ESOPHAGOGASTRODUODENOSCOPY (EGD) WITH PROPOFOL  N/A 05/24/2023   Procedure: ESOPHAGOGASTRODUODENOSCOPY (EGD) WITH PROPOFOL ;   Surgeon: Unk Corinn Skiff, MD;  Location: [facility] ENDOSCOPY;  Service: Gastroenterology;  Laterality: N/A;   ESOPHAGOGASTRODUODENOSCOPY (EGD) WITH PROPOFOL  N/A 08/29/2023   Procedure: ESOPHAGOGASTRODUODENOSCOPY (EGD) WITH PROPOFOL ;  Surgeon: Unk Corinn Skiff, MD;  Location: [facility] ENDOSCOPY;  Service: Gastroenterology;  Laterality: N/A;   GASTRIC BYPASS N/A 12/27/2007   HARDWARE REMOVAL Right 06/22/2023   Procedure: HARDWARE REMOVAL;  Surgeon: Celena Sharper, MD;  Location: [facility] OR;  Service: Orthopedics;  Laterality: Right;   HARDWARE REMOVAL N/A 01/23/2024   Procedure: REMOVAL OF POSTERIOR SPINAL INSTRUMENTATION;  Surgeon: Georgina Sharper LABOR, MD;  Location: [facility] OR;  Service: Orthopedics;  Laterality: N/A;   HIATAL HERNIA REPAIR  09/25/2014   at [facility]   KNEE ARTHROSCOPY Right 06/22/2023   Procedure: ARTHROSCOPY KNEE;  Surgeon: Celena Sharper, MD;  Location: [facility] OR;  Service: Orthopedics;  Laterality: Right;   ORIF FEMUR FRACTURE Right 08/30/2022   Procedure: OPEN REDUCTION INTERNAL FIXATION (ORIF) DISTAL FEMUR FRACTURE;  Surgeon: Celena Sharper, MD;  Location: [facility] OR;  Service: Orthopedics;  Laterality: Right;   POLYPECTOMY  10/22/2020   Procedure: POLYPECTOMY;  Surgeon: Unk Corinn Skiff, MD;  Location: [facility];  Service: Endoscopy;;   POLYPECTOMY  06/16/2022   Procedure: POLYPECTOMY;  Surgeon: Unk Corinn Skiff, MD;  Location: [facility]ERY [facility];  Service: Endoscopy;;   ROTATOR CUFF REPAIR Right    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   TOTAL KNEE ARTHROPLASTY Right 09/19/2023   Procedure: RIGHT TOTAL KNEE ARTHROPLASTY;  Surgeon: Vernetta Lonni GRADE, MD;  Location: [facility] OR;  Service: Orthopedics;  Laterality: Right;    Family History  Problem Relation Age of Onset   Hypertension Mother    Diabetes Mother    Kidney disease Mother    Heart disease Mother    Colon cancer Father 77   Asthma Sister    Obesity Sister    Stroke Sister    Obesity Brother     Diabetes Other    Heart disease Other    Hypertension Other    Kidney disease Other    Cancer Paternal Uncle  unk type, possible prostate   Lung cancer Paternal Grandmother    Social History   Occupational History   Occupation: Hydrographic surveyor    End: 89/68/7977  Tobacco Use   Smoking status: Former    Types: Cigars    Passive exposure: Past   Smokeless tobacco: Never   Tobacco comments:    occasional cigar  Vaping Use   Vaping status: Never Used  Substance and Sexual Activity   Alcohol use: Yes    Comment: Occasionally   Drug use: No   Sexual activity: Yes    Tobacco Counseling Counseling given: Not Answered Tobacco comments: occasional cigar   Immunizations and Health Maintenance Immunization History  Administered Date(s) Administered   Influenza, Seasonal, Injecte, Preservative Fre 10/31/2013, 10/16/2023   Influenza,inj,Quad PF,6+ Mos 12/25/2014, 10/07/2021, 09/14/2022   Influenza-Unspecified 10/07/2021   PFIZER(Purple Top)SARS-COV-2 Vaccination 03/27/2020, 04/17/2020, 11/27/2020   Td 07/12/2023   Tdap 05/29/2013, 11/26/2018   Zoster Recombinant(Shingrix) 07/02/2021, 10/07/2021   There are no preventive care reminders to display for this patient.  Activities of Daily Living    07/12/2024    2:43 PM 01/17/2024    9:19 AM  In your present state of health, do you have any difficulty performing the following activities:  Hearing? 0 0  Vision? 0 0  Difficulty concentrating or making decisions? 0 0  Walking or climbing stairs? 1   Dressing or bathing? 0   Doing errands, shopping? 0 1  Comment  daughter goes to grocery store  Preparing Food and eating ? N   Using the Toilet? Y   In the past six months, have you accidently leaked urine? Y   Do you have problems with loss of bowel control? Y   Managing your Medications? N   Managing your Finances? N   Housekeeping or managing your Housekeeping? Y   Comment daughter helps     Physical Exam    Physical Exam Constitutional:      Appearance: Normal appearance.  HENT:     Mouth/Throat:     Pharynx: No oropharyngeal exudate or posterior oropharyngeal erythema.  Cardiovascular:     Rate and Rhythm: Normal rate and regular rhythm.     Pulses: Normal pulses.     Heart sounds: No murmur heard.    No gallop.     Comments: Occ extra beats Pulmonary:     Effort: Pulmonary effort is normal.     Breath sounds: Normal breath sounds. No wheezing or rales.  Abdominal:     Palpations: Abdomen is soft.     Tenderness: There is no abdominal tenderness.  Musculoskeletal:     Cervical back: Neck supple.     Right lower leg: No edema.     Left lower leg: No edema.  Lymphadenopathy:     Cervical: No cervical adenopathy.  Skin:    Comments: Scattered apparent cysts on calves--mostly right  Neurological:     Mental Status: He is alert.    (optional), or other factors deemed appropriate based on the beneficiary's medical and social history and current clinical standards.   Advanced Directives: Does Patient Have a Medical Advance Directive?: Yes Type of Advance Directive: Healthcare Power of Attorney Does patient want to make changes to medical advance directive?: No - Patient declined Copy of Healthcare Power of Attorney in Chart?: Yes - validated most recent copy scanned in chart (See row information)   EKG:  normal EKG, normal sinus rhythm, unchanged from previous tracings     Assessment:  This is a routine wellness  examination for this patient .   Vision/Hearing screen No results found.   Goals       Plan meals (pt-stated)         Depression Screen    07/12/2024    2:55 PM 05/23/2024    9:33 AM 04/11/2024   10:01 AM 02/02/2024    9:52 AM  PHQ 2/9 Scores  PHQ - 2 Score 6 2 2 6   PHQ- 9 Score 12   14     Fall Risk    07/12/2024    3:00 PM  Fall Risk   Falls in the past year? 1  Number falls in past yr: 1  Injury with Fall? 0  Risk for fall due to : Impaired  balance/gait  Follow up Falls evaluation completed    Cognitive Function        07/12/2024    3:02 PM  6CIT Screen  What Year? 0 points  What month? 0 points  What time? 0 points  Count back from 20 0 points  Months in reverse 0 points  Repeat phrase 0 points  Total Score 0 points    Patient Care Team: Jimmy Charlie FERNS, MD as PCP - General (Internal Medicine) Babara Call, MD as Consulting Physician (Oncology)     Plan:      I have personally reviewed and noted the following in the patient's chart:   Medical and social history Use of alcohol, tobacco or illicit drugs  Current medications and supplements including opioid prescriptions. Patient is currently taking opioid prescriptions. Information provided to patient regarding non-opioid alternatives. Patient advised to discuss non-opioid treatment plan with their provider. Functional ability and status Nutritional status Physical activity Advanced directives List of other physicians Hospitalizations, surgeries, and ER visits in previous 12 months Vitals Screenings to include cognitive, depression, and falls Referrals and appointments  In addition, I have reviewed and discussed with patient certain preventive protocols, quality metrics, and best practice recommendations. A written personalized care plan for preventive services as well as general preventive health recommendations were provided to patient.     Clotilda SHAUNNA Pander, CMA 07/15/2024

## 2024-07-15 NOTE — Assessment & Plan Note (Signed)
 Has improved with the testosterone  Will check PSA and CBC

## 2024-07-15 NOTE — Assessment & Plan Note (Signed)
 I have personally reviewed the Medicare Annual Wellness questionnaire and have noted 1. The patient's medical and social history 2. Their use of alcohol, tobacco or illicit drugs 3. Their current medications and supplements 4. The patient's functional ability including ADL's, fall risks, home safety risks and hearing or visual             impairment. 5. Diet and physical activities 6. Evidence for depression or mood disorders  The patients weight, height, BMI and visual acuity have been recorded in the chart I have made referrals, counseling and provided education to the patient based review of the above and I have provided the pt with a written personalized care plan for preventive services.  I have provided you with a copy of your personalized plan for preventive services. Please take the time to review along with your updated medication list.  Colonoscopy due next year Will check PSA Trying to get back to some exercise Flu/COVID vaccines in the fall

## 2024-07-16 ENCOUNTER — Telehealth: Payer: Self-pay | Admitting: Orthopaedic Surgery

## 2024-07-16 ENCOUNTER — Other Ambulatory Visit: Payer: Self-pay

## 2024-07-16 DIAGNOSIS — I10 Essential (primary) hypertension: Secondary | ICD-10-CM | POA: Diagnosis not present

## 2024-07-16 DIAGNOSIS — G8921 Chronic pain due to trauma: Secondary | ICD-10-CM | POA: Diagnosis not present

## 2024-07-16 MED ORDER — HYDROCHLOROTHIAZIDE 25 MG PO TABS: 25.0000 mg | ORAL_TABLET | Freq: Every day | ORAL | 3 refills | Status: DC

## 2024-07-16 MED ORDER — METHOCARBAMOL 500 MG PO TABS: 500.0000 mg | ORAL_TABLET | Freq: Four times a day (QID) | ORAL | 2 refills | Status: DC | PRN

## 2024-07-16 MED ORDER — FLUTICASONE PROPIONATE 50 MCG/ACT NA SUSP: 2.0000 | Freq: Every day | NASAL | 3 refills | Status: DC | PRN

## 2024-07-16 MED ORDER — TRAZODONE HCL 50 MG PO TABS: 50.0000 mg | ORAL_TABLET | Freq: Every evening | ORAL | 3 refills | Status: DC | PRN

## 2024-07-16 MED ORDER — GABAPENTIN 300 MG PO CAPS: ORAL_CAPSULE | ORAL | 3 refills | Status: AC

## 2024-07-16 MED ORDER — TESTOSTERONE CYPIONATE 200 MG/ML IM SOLN: 200.0000 mg | INTRAMUSCULAR | 3 refills | Status: DC

## 2024-07-16 MED ORDER — PANCRELIPASE (LIP-PROT-AMYL) 36000-114000 UNITS PO CPEP: ORAL_CAPSULE | ORAL | 3 refills | Status: DC

## 2024-07-16 MED ORDER — CETIRIZINE HCL 10 MG PO TABS: 10.0000 mg | ORAL_TABLET | Freq: Every day | ORAL | 3 refills | Status: DC

## 2024-07-16 MED ORDER — MELOXICAM 15 MG PO TABS: 15.0000 mg | ORAL_TABLET | Freq: Every day | ORAL | 1 refills | Status: AC | PRN

## 2024-07-16 MED ORDER — LISINOPRIL 20 MG PO TABS: 20.0000 mg | ORAL_TABLET | Freq: Every day | ORAL | 3 refills | Status: DC

## 2024-07-16 MED ORDER — LUER LOCK SAFETY SYRINGES 22G X 1-1/2" 3 ML MISC: 1.0000 | 3 refills | Status: AC

## 2024-07-16 NOTE — Telephone Encounter (Signed)
 Harold Smith from [facility] called and needs the prescriptions for Methocarbamol  and baclofen . [phone]  fax # [phone] please sent the prescriptions by fax.

## 2024-07-16 NOTE — Addendum Note (Signed)
 Addended by: KALLIE CLOTILDA SQUIBB on: 07/16/2024 12:51 PM   Modules accepted: Orders

## 2024-07-16 NOTE — Addendum Note (Signed)
 Addended by: KALLIE CLOTILDA SQUIBB on: 07/16/2024 09:57 AM   Modules accepted: Orders

## 2024-07-16 NOTE — Addendum Note (Signed)
 Addended by: JIMMY ADE I on: 07/16/2024 11:41 AM   Modules accepted: Orders

## 2024-07-16 NOTE — Addendum Note (Signed)
 Addended by: GEORGINA SHARPER on: 07/16/2024 03:08 PM   Modules accepted: Orders

## 2024-07-16 NOTE — Telephone Encounter (Signed)
 We received it late yesterday. Will find out from Dr [name] if he is filling the Creon  that was written by Dr Unk. She moved to [facility].

## 2024-07-16 NOTE — Telephone Encounter (Signed)
 I asked him to wean off and stop the quetiapine --so I didn't refill it. I am fine with refilling the creon --if he needs it, send in for a year

## 2024-07-16 NOTE — Telephone Encounter (Signed)
 Called pt to verify that he wants to use [facility] and he said yes.

## 2024-07-16 NOTE — Telephone Encounter (Signed)
 Copied from CRM [id number]. Topic: General - Other >> Jul 16, 2024  9:27 AM Marissa P wrote: Reason for CRM: [facility] pharmacy coordinator from [facility] called faxed some request for a prescription that needs to be signed and sent in from provider please.

## 2024-07-17 ENCOUNTER — Other Ambulatory Visit: Payer: Self-pay

## 2024-07-17 DIAGNOSIS — M810 Age-related osteoporosis without current pathological fracture: Secondary | ICD-10-CM

## 2024-07-17 MED ORDER — ALENDRONATE SODIUM 70 MG PO TABS
70.0000 mg | ORAL_TABLET | ORAL | 3 refills | Status: AC
Start: 2024-07-17 — End: ?

## 2024-07-17 MED ORDER — CALCITRIOL 0.25 MCG PO CAPS: 0.2500 ug | ORAL_CAPSULE | Freq: Every day | ORAL | 2 refills | Status: DC

## 2024-07-17 MED ORDER — VITAMIN D (ERGOCALCIFEROL) 1.25 MG (50000 UNIT) PO CAPS: 50000.0000 [IU] | ORAL_CAPSULE | ORAL | 3 refills | Status: AC

## 2024-07-18 ENCOUNTER — Encounter: Payer: Self-pay | Admitting: Physical Medicine & Rehabilitation

## 2024-07-22 ENCOUNTER — Telehealth: Payer: Self-pay

## 2024-07-22 MED ORDER — OXYCODONE-ACETAMINOPHEN 7.5-325 MG PO TABS: 1.0000 | ORAL_TABLET | Freq: Three times a day (TID) | ORAL | 0 refills | Status: DC | PRN

## 2024-07-22 MED ORDER — OXYCONTIN 20 MG PO T12A: 20.0000 mg | EXTENDED_RELEASE_TABLET | Freq: Two times a day (BID) | ORAL | 0 refills | Status: DC

## 2024-07-22 NOTE — Telephone Encounter (Signed)
 [facility] is out of both pain medications. If granted please send both to [facility]. Thank you

## 2024-07-23 ENCOUNTER — Other Ambulatory Visit: Payer: Self-pay | Admitting: Internal Medicine

## 2024-07-23 NOTE — Telephone Encounter (Signed)
 Harold Smith with [facility] needs prescription for pain medication sent to them fax# [phone] and if you need to call them [phone].

## 2024-07-24 ENCOUNTER — Telehealth: Payer: Self-pay | Admitting: Physical Medicine & Rehabilitation

## 2024-07-24 NOTE — Telephone Encounter (Signed)
 Patient needs prior auth on medication.

## 2024-07-26 ENCOUNTER — Telehealth: Payer: Self-pay

## 2024-07-26 NOTE — Telephone Encounter (Signed)
 Allergy List with med list faxed to number provided.

## 2024-07-26 NOTE — Telephone Encounter (Signed)
 Copied from CRM [id number]. Topic: General - Other >> Jul 26, 2024  3:06 PM Chiquita SQUIBB wrote: Reason for CRM: [place] from [facility] is calling in for an allergy list for the patient, a good call number is [phone] and a good fax is [phone]

## 2024-07-26 NOTE — Telephone Encounter (Signed)
 Outcome Denied today by [facility] Medicare 2017 NCPDP Request Reference Number: [id number]. OXYCONTIN  TAB 20MG  ER is denied for not meeting the prior authorization requirement(s). Details of this decision are in the notice attached below or have been faxed to you.

## 2024-07-26 NOTE — Telephone Encounter (Signed)
 Approved today by [facility] Medicare 2017 NCPDP Request Reference Number: [id number]. OXYCOD/APAP TAB 7.5-325 is approved through 08/25/2024. Your patient may now fill this prescription and it will be covered. Effective Date: 07/26/2024

## 2024-07-26 NOTE — Telephone Encounter (Signed)
 Prior auth submitted to [facility] via [employer] Oxycontin  (Key: [id number]) PA Case ID #: [id number] Rx #: [id number]  XNMJB Wehner (Key: [id number]) Oxycodone  acetaminophen  PA Case ID #: [id number] Rx #: [id number]

## 2024-07-29 ENCOUNTER — Ambulatory Visit (INDEPENDENT_AMBULATORY_CARE_PROVIDER_SITE_OTHER): Payer: Medicaid Other | Admitting: Orthopaedic Surgery

## 2024-07-29 ENCOUNTER — Encounter: Payer: Self-pay | Admitting: Orthopaedic Surgery

## 2024-07-29 ENCOUNTER — Other Ambulatory Visit (INDEPENDENT_AMBULATORY_CARE_PROVIDER_SITE_OTHER)

## 2024-07-29 ENCOUNTER — Telehealth: Payer: Self-pay | Admitting: Physical Medicine & Rehabilitation

## 2024-07-29 DIAGNOSIS — Z96651 Presence of right artificial knee joint: Secondary | ICD-10-CM | POA: Diagnosis not present

## 2024-07-29 NOTE — Telephone Encounter (Signed)
 Patient awaiting update on medication refill, please call.

## 2024-07-29 NOTE — Telephone Encounter (Signed)
 Dr Sharyl has reached out to the patient.

## 2024-07-29 NOTE — Progress Notes (Signed)
 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  He is an active 58 year old gentleman.  He had a history of a distal femur fracture that required surgery and then eventually hardware removal.  He is also followed by our spine specialist here Dr. Georgina.  He does report that he is seeing a pain specialist coming up soon.  He does ambit with a cane.  He is also significant on a weight since his trauma in his surgery.  He used to weigh close to 230 pounds he states that he is down to about 190 pounds.  On exam the right knee has full range of motion and it feels ligamentously stable.  There are still some slight swelling.  2 views of the right knee show well-seated total knee arthroplasty with no complicating features.  His right hip has been bothering him some but he points more to his low back and the lateral aspect of his right hip.  Some of this may be affected from his knee replacement.  His right hip does move smoothly and fluidly with no blocks to rotation and previous spine films showed his hips and the hip joint spaces well-maintained.  I would like him to still work on hip and quad exercises and core strengthening.  From my standpoint we will see him back 1 more time in 6 months with a standing AP and lateral of his right operative knee

## 2024-07-30 ENCOUNTER — Encounter: Payer: Self-pay | Admitting: Registered Nurse

## 2024-07-30 ENCOUNTER — Telehealth: Payer: Self-pay | Admitting: Orthopedic Surgery

## 2024-07-30 ENCOUNTER — Encounter: Attending: Registered Nurse | Admitting: Registered Nurse

## 2024-07-30 ENCOUNTER — Telehealth: Payer: Self-pay

## 2024-07-30 VITALS — BP 132/90 | HR 82 | Ht 70.5 in | Wt 204.4 lb

## 2024-07-30 DIAGNOSIS — Z96659 Presence of unspecified artificial knee joint: Secondary | ICD-10-CM

## 2024-07-30 DIAGNOSIS — G894 Chronic pain syndrome: Secondary | ICD-10-CM | POA: Diagnosis not present

## 2024-07-30 DIAGNOSIS — Z96651 Presence of right artificial knee joint: Secondary | ICD-10-CM | POA: Insufficient documentation

## 2024-07-30 DIAGNOSIS — W06XXXD Fall from bed, subsequent encounter: Secondary | ICD-10-CM | POA: Diagnosis not present

## 2024-07-30 DIAGNOSIS — G8929 Other chronic pain: Secondary | ICD-10-CM

## 2024-07-30 DIAGNOSIS — T8484XD Pain due to internal orthopedic prosthetic devices, implants and grafts, subsequent encounter: Secondary | ICD-10-CM | POA: Diagnosis not present

## 2024-07-30 DIAGNOSIS — Z5181 Encounter for therapeutic drug level monitoring: Secondary | ICD-10-CM | POA: Insufficient documentation

## 2024-07-30 DIAGNOSIS — G4700 Insomnia, unspecified: Secondary | ICD-10-CM | POA: Diagnosis not present

## 2024-07-30 DIAGNOSIS — Y92009 Unspecified place in unspecified non-institutional (private) residence as the place of occurrence of the external cause: Secondary | ICD-10-CM | POA: Diagnosis not present

## 2024-07-30 DIAGNOSIS — M25569 Pain in unspecified knee: Secondary | ICD-10-CM

## 2024-07-30 DIAGNOSIS — G4709 Other insomnia: Secondary | ICD-10-CM | POA: Insufficient documentation

## 2024-07-30 DIAGNOSIS — M25561 Pain in right knee: Secondary | ICD-10-CM | POA: Insufficient documentation

## 2024-07-30 DIAGNOSIS — W19XXXD Unspecified fall, subsequent encounter: Secondary | ICD-10-CM

## 2024-07-30 DIAGNOSIS — Z79899 Other long term (current) drug therapy: Secondary | ICD-10-CM | POA: Insufficient documentation

## 2024-07-30 DIAGNOSIS — M545 Low back pain, unspecified: Secondary | ICD-10-CM | POA: Diagnosis not present

## 2024-07-30 MED ORDER — XTAMPZA ER 13.5 MG PO C12A
1.0000 | EXTENDED_RELEASE_CAPSULE | Freq: Two times a day (BID) | ORAL | 0 refills | Status: DC
Start: 2024-07-30 — End: 2024-08-23

## 2024-07-30 MED ORDER — OXYCODONE-ACETAMINOPHEN 7.5-325 MG PO TABS: 1.0000 | ORAL_TABLET | Freq: Four times a day (QID) | ORAL | 0 refills | Status: DC | PRN

## 2024-07-30 NOTE — Progress Notes (Signed)
 Subjective:    Patient ID: Harold Smith, male    DOB: 03/19/1966, 58 y.o.   MRN: [id number]  HPI: Harold Smith is a 58 y.o. male who returns for follow up appointment for chronic pain and medication refill. He states his  pain is located in his lower back and right knee pain. He rates his pain 10. current exercise regime is walking and performing stretching exercises.  [name] Morphine  equivalent is 93.75 MME.   Oxycontin  not covered, Xtampza  e-scribed to pharmacy, this was discussed with Dr Urbano, he is in agreement with plan.   He also reports he fell out of bed, and landed on his left hip. He was able to pick himself up. He educated on falls prevention, he verbalizes understanding.   UDS ordered today.    Pain Inventory Average Pain 10 Pain Right Now 10 My pain is sharp, burning, stabbing, and aching  In the last 24 hours, has pain interfered with the following? General activity 10 Relation with others 8 Enjoyment of life 10 What TIME of day is your pain at its worst? morning , daytime, evening, and night Sleep (in general) Poor  Pain is worse with: walking, bending, sitting, standing, and some activites Pain improves with: pacing activities and medication Relief from Meds: 8  Family History  Problem Relation Age of Onset   Hypertension Mother    Diabetes Mother    Kidney disease Mother    Heart disease Mother    Colon cancer Father 27   Asthma Sister    Obesity Sister    Stroke Sister    Obesity Brother    Diabetes Other    Heart disease Other    Hypertension Other    Kidney disease Other    Cancer Paternal Uncle        unk type, possible prostate   Lung cancer Paternal Grandmother    Social History   Socioeconomic History   Marital status: Single    Spouse name: Not on file   Number of children: 3   Years of education: Not on file   Highest education level: Some college, no degree  Occupational History   Occupation: Doctor, hospital    End: 89/68/7977  Tobacco Use   Smoking status: Former    Types: Cigars    Passive exposure: Past   Smokeless tobacco: Never   Tobacco comments:    occasional cigar  Vaping Use   Vaping status: Never Used  Substance and Sexual Activity   Alcohol use: Yes    Comment: Occasionally   Drug use: No   Sexual activity: Yes  Other Topics Concern   Not on file  Social History Narrative   Jehovah's witness--no blood products   No formal living will   Daughter Dela should be health care POA   Would accept resuscitation   Would accept feeding tube---and then leave it up to family   Social Drivers of Health   Financial Resource Strain: Medium Risk (07/14/2024)   Overall Financial Resource Strain (CARDIA)    Difficulty of Paying Living Expenses: Somewhat hard  Food Insecurity: Food Insecurity Present (07/14/2024)   Hunger Vital Sign    Worried About Running Out of Food in the Last Year: Sometimes true    Ran Out of Food in the Last Year: Sometimes true  Transportation Needs: No Transportation Needs (07/14/2024)   PRAPARE - Administrator, Civil Service (Medical): No    Lack of Transportation (  Non-Medical): No  Physical Activity: Inactive (07/14/2024)   Exercise Vital Sign    Days of Exercise per Week: 0 days    Minutes of Exercise per Session: Not on file  Stress: Stress Concern Present (07/14/2024)   [employer] of Occupational Health - Occupational Stress Questionnaire    Feeling of Stress: Very much  Social Connections: Socially Isolated (07/14/2024)   Social Connection and Isolation Panel    Frequency of Communication with Friends and Family: More than three times a week    Frequency of Social Gatherings with Friends and Family: Once a week    Attends Religious Services: Never    Database administrator or Organizations: No    Attends Engineer, structural: Not on file    Marital Status: Divorced   Past Surgical History:  Procedure Laterality  Date   ABDOMINAL EXPOSURE N/A 03/06/2023   Procedure: ABDOMINAL EXPOSURE;  Surgeon: Gretta Lonni PARAS, MD;  Location: [facility] OR;  Service: Vascular;  Laterality: N/A;   ACHILLES TENDON REPAIR Right 12/26/2006   ANTERIOR LUMBAR FUSION N/A 03/06/2023   Procedure: L5-S1 ANTERIOR LUMBAR FUSION 1 LEVEL;  Surgeon: Georgina Ozell LABOR, MD;  [facility]: [facility] OR;  Service: Orthopedics;  Laterality: N/A;   BIOPSY  08/29/2023   Procedure: BIOPSY;  Surgeon: Unk Corinn Skiff, MD;  Location: [facility] ENDOSCOPY;  Service: Gastroenterology;;   CATARACT EXTRACTION W/PHACO Left 07/20/2021   Procedure: CATARACT EXTRACTION PHACO AND INTRAOCULAR LENS PLACEMENT (IOC) LEFT 2.11 00:24.0;  Surgeon: Jaye Fallow, MD;  Location: [facility]ERY [facility];  Service: Ophthalmology;  Laterality: Left;  sleep apnea   CATARACT EXTRACTION W/PHACO Right 08/03/2021   Procedure: CATARACT EXTRACTION PHACO AND INTRAOCULAR LENS PLACEMENT (IOC) RIGHT;  Surgeon: Jaye Fallow, MD;  Location: [facility]ERY [facility];  Service: Ophthalmology;  Laterality: Right;  3.19 0:33.0   CHOLECYSTECTOMY     COLON RESECTION Left 12/26/2004   due to diverticular disease at [facility]   COLONOSCOPY  2013?   COLONOSCOPY N/A 11/30/2022   Procedure: COLONOSCOPY;  Surgeon: Unk Corinn Skiff, MD;  Location: [facility] ENDOSCOPY;  Service: Gastroenterology;  Laterality: N/A;   COLONOSCOPY WITH PROPOFOL  N/A 05/22/2018   Procedure: COLONOSCOPY WITH PROPOFOL ;  Surgeon: Unk Corinn Skiff, MD;  Location: [facility] ENDOSCOPY;  Service: Gastroenterology;  Laterality: N/A;   COLONOSCOPY WITH PROPOFOL  N/A 10/22/2020   Procedure: COLONOSCOPY WITH PROPOFOL ;  Surgeon: Unk Corinn Skiff, MD;  Location: [facility]ERY [facility];  Service: Endoscopy;  Laterality: N/A;  priority 4   COLONOSCOPY WITH PROPOFOL  N/A 11/10/2021   Procedure: COLONOSCOPY WITH PROPOFOL ;  Surgeon: Unk Corinn Skiff, MD;  Location: [facility] ENDOSCOPY;  Service: Gastroenterology;  Laterality: N/A;   COLONOSCOPY WITH  PROPOFOL  N/A 06/16/2022   Procedure: COLONOSCOPY WITH PROPOFOL ;  Surgeon: Unk Corinn Skiff, MD;  Location: [facility];  Service: Endoscopy;  Laterality: N/A;   COLONOSCOPY WITH PROPOFOL  N/A 11/29/2022   Procedure: COLONOSCOPY WITH PROPOFOL ;  Surgeon: Unk Corinn Skiff, MD;  Location: [facility] ENDOSCOPY;  Service: Gastroenterology;  Laterality: N/A;   DRUG INDUCED ENDOSCOPY N/A 06/10/2019   Procedure: DRUG INDUCED SLEEP ENDOSCOPY;  Surgeon: Mable Lenis, MD;  Location:  [facility];  Service: ENT;  Laterality: N/A;   ESOPHAGOGASTRODUODENOSCOPY (EGD) WITH PROPOFOL  N/A 06/16/2022   Procedure: ESOPHAGOGASTRODUODENOSCOPY (EGD) WITH PROPOFOL ;  Surgeon: Unk Corinn Skiff, MD;  Location: [facility]ERY [facility]TR;  Service: Endoscopy;  Laterality: N/A;   ESOPHAGOGASTRODUODENOSCOPY (EGD) WITH PROPOFOL  N/A 05/24/2023   Procedure: ESOPHAGOGASTRODUODENOSCOPY (EGD) WITH PROPOFOL ;  Surgeon: Unk Corinn Skiff, MD;  Location: [facility] ENDOSCOPY;  Service: Gastroenterology;  Laterality: N/A;   ESOPHAGOGASTRODUODENOSCOPY (EGD) WITH PROPOFOL  N/A 08/29/2023   Procedure: ESOPHAGOGASTRODUODENOSCOPY (EGD) WITH PROPOFOL ;  Surgeon: Unk Corinn Skiff, MD;  Location: [facility] ENDOSCOPY;  Service: Gastroenterology;  Laterality: N/A;   GASTRIC BYPASS N/A 12/27/2007   HARDWARE REMOVAL Right 06/22/2023   Procedure: HARDWARE REMOVAL;  Surgeon: Celena Sharper, MD;  Location: [facility] OR;  Service: Orthopedics;  Laterality: Right;   HARDWARE REMOVAL N/A 01/23/2024   Procedure: REMOVAL OF POSTERIOR SPINAL INSTRUMENTATION;  Surgeon: Georgina Sharper LABOR, MD;  [facility] OR;  Service: Orthopedics;  Laterality: N/A;   HIATAL HERNIA REPAIR  09/25/2014   at [facility]   KNEE ARTHROSCOPY Right 06/22/2023   Procedure: ARTHROSCOPY KNEE;  Surgeon: Celena Sharper, MD;  Location: [facility] OR;  Service: Orthopedics;  Laterality: Right;   ORIF FEMUR FRACTURE Right 08/30/2022   Procedure: OPEN REDUCTION INTERNAL FIXATION (ORIF) DISTAL FEMUR  FRACTURE;  Surgeon: Celena Sharper, MD;  [facility] OR;  Service: Orthopedics;  Laterality: Right;   POLYPECTOMY  10/22/2020   Procedure: POLYPECTOMY;  Surgeon: Unk Corinn Skiff, MD;  Location: [facility]ERY [facility];  Service: Endoscopy;;   POLYPECTOMY  06/16/2022   Procedure: POLYPECTOMY;  Surgeon: Unk Corinn Skiff, MD;  Location: [facility]ERY [facility];  Service: Endoscopy;;   ROTATOR CUFF REPAIR Right    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   TOTAL KNEE ARTHROPLASTY Right 09/19/2023   Procedure: RIGHT TOTAL KNEE ARTHROPLASTY;  Surgeon: Vernetta Lonni GRADE, MD;  Location: [facility] OR;  Service: Orthopedics;  Laterality: Right;   Past Surgical History:  Procedure Laterality Date   ABDOMINAL EXPOSURE N/A 03/06/2023   Procedure: ABDOMINAL EXPOSURE;  Surgeon: Gretta Lonni PARAS, MD;  Location: [facility] OR;  Service: Vascular;  Laterality: N/A;   ACHILLES TENDON REPAIR Right 12/26/2006   ANTERIOR LUMBAR FUSION N/A 03/06/2023   Procedure: L5-S1 ANTERIOR LUMBAR FUSION 1 LEVEL;  Surgeon: Georgina Sharper LABOR, MD;  [facility];  Service: Orthopedics;  Laterality: N/A;   BIOPSY  08/29/2023   Procedure: BIOPSY;  Surgeon: Unk Corinn Skiff, MD;  Location: [facility] ENDOSCOPY;  Service: Gastroenterology;;   CATARACT EXTRACTION W/PHACO Left 07/20/2021   Procedure: CATARACT EXTRACTION PHACO AND INTRAOCULAR LENS PLACEMENT (IOC) LEFT 2.11 00:24.0;  Surgeon: Jaye Fallow, MD;  Location: [facility];  Service: Ophthalmology;  Laterality: Left;  sleep apnea   CATARACT EXTRACTION W/PHACO Right 08/03/2021   Procedure: CATARACT EXTRACTION PHACO AND INTRAOCULAR LENS PLACEMENT (IOC) RIGHT;  Surgeon: Jaye Fallow, MD;  Location: [facility] SUR[facility]ERY [facility];  Service: Ophthalmology;  Laterality: Right;  3.19 0:33.0   CHOLECYSTECTOMY     COLON RESECTION Left 12/26/2004   due to diverticular disease at [facility]   COLONOSCOPY  2013?   COLONOSCOPY N/A 11/30/2022   Procedure: COLONOSCOPY;  Surgeon: Unk Corinn Skiff, MD;  Location: [facility] ENDOSCOPY;  Service: Gastroenterology;  Laterality: N/A;   COLONOSCOPY WITH PROPOFOL  N/A 05/22/2018   Procedure: COLONOSCOPY WITH PROPOFOL ;  Surgeon: Unk Corinn Skiff, MD;  Location: [facility] ENDOSCOPY;  Service: Gastroenterology;  Laterality: N/A;   COLONOSCOPY WITH PROPOFOL  N/A 10/22/2020   Procedure: COLONOSCOPY WITH PROPOFOL ;  Surgeon: Unk Corinn Skiff, MD;  Location: [facility] SURGERY [facility]TR;  Service: Endoscopy;  Laterality: N/A;  priority 4   COLONOSCOPY WITH PROPOFOL  N/A 11/10/2021   Procedure: COLONOSCOPY WITH PROPOFOL ;  Surgeon: Unk Corinn Skiff, MD;  Location: [facility] ENDOSCOPY;  Service: Gastroenterology;  Laterality: N/A;   COLONOSCOPY WITH PROPOFOL  N/A 06/16/2022   Procedure: COLONOSCOPY WITH PROPOFOL ;  Surgeon: Unk Corinn Skiff, MD;  Location: [facility];  Service: Endoscopy;  Laterality: N/A;   COLONOSCOPY WITH PROPOFOL  N/A 11/29/2022   Procedure: COLONOSCOPY WITH PROPOFOL ;  Surgeon: Unk Corinn Skiff, MD;  Location: [facility] ENDOSCOPY;  Service: Gastroenterology;  Laterality: N/A;   DRUG INDUCED ENDOSCOPY N/A 06/10/2019   Procedure: DRUG INDUCED SLEEP ENDOSCOPY;  Surgeon: Mable Lenis, MD;  Location: [facility];  Service: ENT;  Laterality: N/A;   ESOPHAGOGASTRODUODENOSCOPY (EGD) WITH PROPOFOL  N/A 06/16/2022   Procedure: ESOPHAGOGASTRODUODENOSCOPY (EGD) WITH PROPOFOL ;  Surgeon: Unk Corinn Skiff, MD;  [facility];  Service: Endoscopy;  Laterality: N/A;   ESOPHAGOGASTRODUODENOSCOPY (EGD) WITH PROPOFOL  N/A 05/24/2023   Procedure: ESOPHAGOGASTRODUODENOSCOPY (EGD) WITH PROPOFOL ;  Surgeon: Unk Corinn Skiff, MD;  Location: [facility] ENDOSCOPY;  Service: Gastroenterology;  Laterality: N/A;   ESOPHAGOGASTRODUODENOSCOPY (EGD) WITH PROPOFOL  N/A 08/29/2023   Procedure: ESOPHAGOGASTRODUODENOSCOPY (EGD) WITH PROPOFOL ;  Surgeon: Unk Corinn Skiff, MD;  Location: [facility] ENDOSCOPY;  Service: Gastroenterology;  Laterality:  N/A;   GASTRIC BYPASS N/A 12/27/2007   HARDWARE REMOVAL Right 06/22/2023   Procedure: HARDWARE REMOVAL;  Surgeon: Celena Sharper, MD;  Location: [facility] OR;  Service: Orthopedics;  Laterality: Right;   HARDWARE REMOVAL N/A 01/23/2024   Procedure: REMOVAL OF POSTERIOR SPINAL INSTRUMENTATION;  Surgeon: Georgina Sharper LABOR, MD;  [facility];  Service: Orthopedics;  Laterality: N/A;   HIATAL HERNIA REPAIR  09/25/2014   at [facility]   KNEE ARTHROSCOPY Right 06/22/2023   Procedure: ARTHROSCOPY KNEE;  Surgeon: Celena Sharper, MD;  Location: [facility] OR;  Service: Orthopedics;  Laterality: Right;   ORIF FEMUR FRACTURE Right 08/30/2022   Procedure: OPEN REDUCTION INTERNAL FIXATION (ORIF) DISTAL FEMUR FRACTURE;  Surgeon: Celena Sharper, MD;  [facility];  Service: Orthopedics;  Laterality: Right;   POLYPECTOMY  10/22/2020   Procedure: POLYPECTOMY;  Surgeon: Unk Corinn Skiff, MD;  Location: [facility];  Service: Endoscopy;;   POLYPECTOMY  06/16/2022   Procedure: POLYPECTOMY;  Surgeon: Unk Corinn Skiff, MD;  Location: [facility]ERY [facility];  Service: Endoscopy;;   ROTATOR CUFF REPAIR Right    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   TOTAL KNEE ARTHROPLASTY Right 09/19/2023   Procedure: RIGHT TOTAL KNEE ARTHROPLASTY;  Surgeon: Vernetta Lonni GRADE, MD;  Location[facility] OR;  Service: Orthopedics;  Laterality: Right;   Past Medical History:  Diagnosis Date   Allergic rhinitis due to pollen    Anemia    Anxiety    Chronic pain    Colon polyps    Depression    Diverticulitis large intestine    Erosive esophagitis    GERD (gastroesophageal reflux disease)    HTN (hypertension)    Irritable bowel syndrome    Metabolic bone disease 09/01/2022   Obesity    Osteoarthritis, knee    Sleep apnea    does not use cpap, pt states he no longer needed CPAP due to weight loss   Sleep disturbance    Vitamin D  deficiency 09/01/2022   BP (!) 132/90 (BP Location: Left Arm, Patient Position: Sitting,  Cuff Size: Large) Comment: Second BP  Pulse (!) 101   Ht 5' 10.5 (1.791 m)   Wt 204 lb 6.4 oz (92.7 kg)   SpO2 94%   BMI 28.91 kg/m   Opioid Risk Score:   Fall Risk Score:  `1  Depression screen [facility] 2/9     07/30/2024    9:02 AM 07/12/2024    2:55 PM 05/23/2024    9:33 AM 04/11/2024   10:01 AM 02/02/2024    9:52  AM 01/05/2024    8:30 AM 07/12/2023   10:06 AM  Depression screen PHQ 2/9  Decreased Interest 1 3 1 1 3 2 3   Down, Depressed, Hopeless 1 3 1 1 3 3 3   PHQ - 2 Score 2 6 2 2 6 5 6   Altered sleeping 3 3   3 3 3   Tired, decreased energy 1 3   2 2 3   Change in appetite 0 0   1 0 2  Feeling bad or failure about yourself  0 0   1 1 1   Trouble concentrating 0 0   1 1 2   Moving slowly or fidgety/restless 0 0   0 2 2  Suicidal thoughts 0 0   0 0 0  PHQ-9 Score 6 12   14 14 19   Difficult doing work/chores Extremely dIfficult Not difficult at all   Very difficult Very difficult Very difficult      Review of Systems  Musculoskeletal:  Positive for back pain, joint swelling and neck pain.       Left knee pain, back pain and neck pain  All other systems reviewed and are negative.      Objective:   Physical Exam Vitals and nursing note reviewed.  Constitutional:      Appearance: Normal appearance.  Cardiovascular:     Rate and Rhythm: Normal rate and regular rhythm.     Pulses: Normal pulses.     Heart sounds: Normal heart sounds.  Pulmonary:     Effort: Pulmonary effort is normal.     Breath sounds: Normal breath sounds.  Musculoskeletal:     Comments: Normal Muscle Bulk and Muscle Testing Reveals:  Upper Extremities: Full ROM and Muscle Strength 5/5  Lumbar Paraspinal Tenderness: L-4-L-5 Let Greater trochanter Tenderness Lower Extremities: Right: Decreased ROM and Muscle Strength 5/5 Right Lower extremity Flexion Produces Pain into Right Patella Left Lower Extremity: Full ROM and Muscle Strength 5/5 Arises from Table slowly using cane for support Antalgic  Gait      Skin:    General: Skin is warm and dry.  Neurological:     Mental Status: He is alert and oriented to person, place, and time.  Psychiatric:        Mood and Affect: Mood normal.        Behavior: Behavior normal.           Assessment & Plan:  Chronic Bilateral low back pain without Sciatica: Continue HEP as tolerated. Continue to Monitor.  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  3. Insomnia: Continue Trazodone . Continue to Monitor.  4. Fall at Home subsequent encounter: Educated on [employer], he verbalizes understanding.  5. Chronic Pain Syndrome: RX: Xtampza  13. 5 mg one capsule every 12 hours #60, Oxycontin  not covered and Refilled: Oxycodone  7.5/325 mg one tablet every 6 hours as needed for #120. We will continue the opioid monitoring program, this consists of regular clinic visits, examinations, urine drug screen, pill counts as well as use of [place]  Controlled Substance Reporting system. A 12 month History has been reviewed on the [place]  Controlled Substance Reporting System on 07/30/2024  F/U in 1 month

## 2024-07-30 NOTE — Telephone Encounter (Signed)
 Need PA on pain med

## 2024-07-30 NOTE — Telephone Encounter (Signed)
 Pt called about dental forms again and asking if we can complete all forms sent from dental office. Advised pt to have dental office re fax form so we can complete all pages and fax back to dental office. Pt phone is [phone].

## 2024-07-31 ENCOUNTER — Telehealth: Payer: Self-pay

## 2024-07-31 NOTE — Telephone Encounter (Signed)
(  KeyBETHA GARB) Rx #: [id number] Submitted for Xtampza   [employer] The plan will fax you a determination, typically within 1 to 5 business days.

## 2024-07-31 NOTE — Telephone Encounter (Signed)
 In process by Cara Ruth CMA

## 2024-08-02 NOTE — Telephone Encounter (Signed)
 XTAMPZA  not approved today by [facility] 2017 NCPDP This medication or product is on your plan's list of covered drugs. Prior authorization is not required at this time.   Not covered.

## 2024-08-02 NOTE — Telephone Encounter (Signed)
 Xtampza   ER submitted to [facility] on 08/02/2024 (ref# [id number]), ID# [id number].

## 2024-08-02 NOTE — Telephone Encounter (Signed)
 Update: Xtampza  has been approved.

## 2024-08-03 LAB — TOXASSURE SELECT,+ANTIDEPR,UR

## 2024-08-04 ENCOUNTER — Telehealth: Payer: Self-pay | Admitting: Registered Nurse

## 2024-08-04 NOTE — Telephone Encounter (Signed)
 Oxycontin  and Xtampza  was denied.  PA was submitted and denied.  Will have the pool , to review the above.

## 2024-08-05 NOTE — Telephone Encounter (Addendum)
 This has already been done but will send again. The allergies are on the top of the medication list. He has no allergies.

## 2024-08-05 NOTE — Telephone Encounter (Unsigned)
 Copied from CRM #[id number]. Topic: General - Other >> Aug 05, 2024 11:18 AM Franky GRADE wrote: Reason for CRM: Brandi from [facility] is calling to request an allergy list for the patient. Best Fax number is [phone].

## 2024-08-08 ENCOUNTER — Other Ambulatory Visit: Payer: Self-pay | Admitting: Family

## 2024-08-16 DIAGNOSIS — G8921 Chronic pain due to trauma: Secondary | ICD-10-CM | POA: Diagnosis not present

## 2024-08-16 DIAGNOSIS — I10 Essential (primary) hypertension: Secondary | ICD-10-CM | POA: Diagnosis not present

## 2024-08-23 ENCOUNTER — Encounter: Admitting: Registered Nurse

## 2024-08-23 VITALS — BP 147/99 | HR 99 | Ht 70.5 in | Wt 195.0 lb

## 2024-08-23 DIAGNOSIS — Z79899 Other long term (current) drug therapy: Secondary | ICD-10-CM | POA: Diagnosis not present

## 2024-08-23 DIAGNOSIS — M25569 Pain in unspecified knee: Secondary | ICD-10-CM

## 2024-08-23 DIAGNOSIS — Z96659 Presence of unspecified artificial knee joint: Secondary | ICD-10-CM | POA: Diagnosis not present

## 2024-08-23 DIAGNOSIS — G4700 Insomnia, unspecified: Secondary | ICD-10-CM | POA: Diagnosis not present

## 2024-08-23 DIAGNOSIS — G4709 Other insomnia: Secondary | ICD-10-CM | POA: Diagnosis not present

## 2024-08-23 DIAGNOSIS — G894 Chronic pain syndrome: Secondary | ICD-10-CM

## 2024-08-23 DIAGNOSIS — Z5181 Encounter for therapeutic drug level monitoring: Secondary | ICD-10-CM | POA: Diagnosis not present

## 2024-08-23 DIAGNOSIS — M25561 Pain in right knee: Secondary | ICD-10-CM | POA: Diagnosis not present

## 2024-08-23 DIAGNOSIS — T8484XD Pain due to internal orthopedic prosthetic devices, implants and grafts, subsequent encounter: Secondary | ICD-10-CM | POA: Diagnosis not present

## 2024-08-23 DIAGNOSIS — M545 Low back pain, unspecified: Secondary | ICD-10-CM

## 2024-08-23 DIAGNOSIS — Z96651 Presence of right artificial knee joint: Secondary | ICD-10-CM | POA: Diagnosis not present

## 2024-08-23 DIAGNOSIS — G8929 Other chronic pain: Secondary | ICD-10-CM

## 2024-08-23 MED ORDER — OXYCODONE-ACETAMINOPHEN 7.5-325 MG PO TABS: 1.0000 | ORAL_TABLET | Freq: Four times a day (QID) | ORAL | 0 refills | Status: DC | PRN

## 2024-08-23 MED ORDER — XTAMPZA ER 18 MG PO C12A: 1.0000 | EXTENDED_RELEASE_CAPSULE | Freq: Two times a day (BID) | ORAL | 0 refills | Status: DC

## 2024-08-23 NOTE — Patient Instructions (Signed)
 [name]  [id number]

## 2024-08-23 NOTE — Progress Notes (Signed)
 Subjective:    Patient ID: Harold Smith, male    DOB: 1966-07-23, 58 y.o.   MRN: [id number]  HPI: Harold Smith is a 58 y.o. male who returns for follow up appointment for chronic pain and medication refill. He states his pain is located in his lower back and right knee. He rates his pain 8. Harold Smith reports 3- 4 hours of pain relief with current medication regimen. This was discussed with Dr Urbano, his Xtampza  dose was changed, Harold Smith was called regarding the above and verbalizes understanding. His current exercise regime is walking and performing stretching exercises.  Harold Smith Morphine  equivalent is 78.73 MME.   Last UDS was Performed on 07/30/2024, it was consistent.    Pain Inventory Average Pain 8 Pain Right Now 8 My pain is sharp, burning, dull, stabbing, tingling, and aching  In the last 24 hours, has pain interfered with the following? General activity 5 Relation with others 6 Enjoyment of life 4 What TIME of day is your pain at its worst? morning , daytime, evening, and night Sleep (in general) Poor  Pain is worse with: walking, bending, sitting, standing, and some activites Pain improves with: rest, pacing activities, and medication Relief from Meds: 6  Family History  Problem Relation Age of Onset   Hypertension Mother    Diabetes Mother    Kidney disease Mother    Heart disease Mother    Colon cancer Father 62   Asthma Sister    Obesity Sister    Stroke Sister    Obesity Brother    Diabetes Other    Heart disease Other    Hypertension Other    Kidney disease Other    Cancer Paternal Uncle        unk type, possible prostate   Lung cancer Paternal Grandmother    Social History   Socioeconomic History   Marital status: Single    Spouse name: Not on file   Number of children: 3   Years of education: Not on file   Highest education level: Some college, no degree  Occupational History   Occupation: Hydrographic surveyor    End:  89/68/7977  Tobacco Use   Smoking status: Former    Types: Cigars    Passive exposure: Past   Smokeless tobacco: Never   Tobacco comments:    occasional cigar  Vaping Use   Vaping status: Never Used  Substance and Sexual Activity   Alcohol use: Yes    Comment: Occasionally   Drug use: No   Sexual activity: Yes  Other Topics Concern   Not on file  Social History Narrative   Jehovah's witness--no blood products   No formal living will   Daughter Dela should be health care POA   Would accept resuscitation   Would accept feeding tube---and then leave it up to family   Social Drivers of Health   Financial Resource Strain: Medium Risk (07/14/2024)   Overall Financial Resource Strain (CARDIA)    Difficulty of Paying Living Expenses: Somewhat hard  Food Insecurity: Food Insecurity Present (07/14/2024)   Hunger Vital Sign    Worried About Running Out of Food in the Last Year: Sometimes true    Ran Out of Food in the Last Year: Sometimes true  Transportation Needs: No Transportation Needs (07/14/2024)   PRAPARE - Administrator, Civil Service (Medical): No    Lack of Transportation (Non-Medical): No  Physical Activity: Inactive (07/14/2024)   Exercise  Vital Sign    Days of Exercise per Week: 0 days    Minutes of Exercise per Session: Not on file  Stress: Stress Concern Present (07/14/2024)   [employer] of Occupational Health - Occupational Stress Questionnaire    Feeling of Stress: Very much  Social Connections: Socially Isolated (07/14/2024)   Social Connection and Isolation Panel    Frequency of Communication with Friends and Family: More than three times a week    Frequency of Social Gatherings with Friends and Family: Once a week    Attends Religious Services: Never    Database administrator or Organizations: No    Attends Engineer, structural: Not on file    Marital Status: Divorced   Past Surgical History:  Procedure Laterality Date   ABDOMINAL  EXPOSURE N/A 03/06/2023   Procedure: ABDOMINAL EXPOSURE;  Surgeon: Gretta Lonni PARAS, MD;  Location: [facility] OR;  Service: Vascular;  Laterality: N/A;   ACHILLES TENDON REPAIR Right 12/26/2006   ANTERIOR LUMBAR FUSION N/A 03/06/2023   Procedure: L5-S1 ANTERIOR LUMBAR FUSION 1 LEVEL;  Surgeon: Georgina Ozell LABOR, MD;  Location: [facility] OR;  Service: Orthopedics;  Laterality: N/A;   BIOPSY  08/29/2023   Procedure: BIOPSY;  Surgeon: Unk Corinn Skiff, MD;  Location: [facility] ENDOSCOPY;  Service: Gastroenterology;;   CATARACT EXTRACTION W/PHACO Left 07/20/2021   Procedure: CATARACT EXTRACTION PHACO AND INTRAOCULAR LENS PLACEMENT (IOC) LEFT 2.11 00:24.0;  Surgeon: Jaye Fallow, MD;  Location[facility];  Service: Ophthalmology;  Laterality: Left;  sleep apnea   CATARACT EXTRACTION W/PHACO Right 08/03/2021   Procedure: CATARACT EXTRACTION PHACO AND INTRAOCULAR LENS PLACEMENT (IOC) RIGHT;  Surgeon: Jaye Fallow, MD;  Location: [facility] SUR[facility]ERY [facility]TR;  Service: Ophthalmology;  Laterality: Right;  3.19 0:33.0   CHOLECYSTECTOMY     COLON RESECTION Left 12/26/2004   due to diverticular disease at [facility]   COLONOSCOPY  2013?   COLONOSCOPY N/A 11/30/2022   Procedure: COLONOSCOPY;  Surgeon: Unk Corinn Skiff, MD;  Location: [facility] ENDOSCOPY;  Service: Gastroenterology;  Laterality: N/A;   COLONOSCOPY WITH PROPOFOL  N/A 05/22/2018   Procedure: COLONOSCOPY WITH PROPOFOL ;  Surgeon: Unk Corinn Skiff, MD;  Location: [facility] ENDOSCOPY;  Service: Gastroenterology;  Laterality: N/A;   COLONOSCOPY WITH PROPOFOL  N/A 10/22/2020   Procedure: COLONOSCOPY WITH PROPOFOL ;  Surgeon: Unk Corinn Skiff, MD;  Location: [facility] SUR[facility]ERY [facility];  Service: Endoscopy;  Laterality: N/A;  priority 4   COLONOSCOPY WITH PROPOFOL  N/A 11/10/2021   Procedure: COLONOSCOPY WITH PROPOFOL ;  Surgeon: Unk Corinn Skiff, MD;  Location: [facility] ENDOSCOPY;  Service: Gastroenterology;  Laterality: N/A;   COLONOSCOPY WITH PROPOFOL  N/A 06/16/2022    Procedure: COLONOSCOPY WITH PROPOFOL ;  Surgeon: Unk Corinn Skiff, MD;  Location: [facility] SUR[facility]ERY CNTR;  Service: Endoscopy;  Laterality: N/A;   COLONOSCOPY WITH PROPOFOL  N/A 11/29/2022   Procedure: COLONOSCOPY WITH PROPOFOL ;  Surgeon: Unk Corinn Skiff, MD;  Location: [facility] ENDOSCOPY;  Service: Gastroenterology;  Laterality: N/A;   DRUG INDUCED ENDOSCOPY N/A 06/10/2019   Procedure: DRUG INDUCED SLEEP ENDOSCOPY;  Surgeon: Mable Lenis, MD;  Location: [facility];  Service: ENT;  Laterality: N/A;   ESOPHAGOGASTRODUODENOSCOPY (EGD) WITH PROPOFOL  N/A 06/16/2022   Procedure: ESOPHAGOGASTRODUODENOSCOPY (EGD) WITH PROPOFOL ;  Surgeon: Unk Corinn Skiff, MD;  Location: [facility];  Service: Endoscopy;  Laterality: N/A;   ESOPHAGOGASTRODUODENOSCOPY (EGD) WITH PROPOFOL  N/A 05/24/2023   Procedure: ESOPHAGOGASTRODUODENOSCOPY (EGD) WITH PROPOFOL ;  Surgeon: Unk Corinn Skiff, MD;  Location: [facility] ENDOSCOPY;  Service: Gastroenterology;  Laterality: N/A;   ESOPHAGOGASTRODUODENOSCOPY (EGD) WITH  PROPOFOL  N/A 08/29/2023   Procedure: ESOPHAGOGASTRODUODENOSCOPY (EGD) WITH PROPOFOL ;  Surgeon: Unk Corinn Skiff, MD;  Location: [facility] ENDOSCOPY;  Service: Gastroenterology;  Laterality: N/A;   GASTRIC BYPASS N/A 12/27/2007   HARDWARE REMOVAL Right 06/22/2023   Procedure: HARDWARE REMOVAL;  Surgeon: Celena Sharper, MD;  Location: [facility] OR;  Service: Orthopedics;  Laterality: Right;   HARDWARE REMOVAL N/A 01/23/2024   Procedure: REMOVAL OF POSTERIOR SPINAL INSTRUMENTATION;  Surgeon: Georgina Sharper LABOR, MD;  [facility]: [facility] OR;  Service: Orthopedics;  Laterality: N/A;   HIATAL HERNIA REPAIR  09/25/2014   at [facility]   KNEE ARTHROSCOPY Right 06/22/2023   Procedure: ARTHROSCOPY KNEE;  Surgeon: Celena Sharper, MD;  Location: [facility] OR;  Service: Orthopedics;  Laterality: Right;   ORIF FEMUR FRACTURE Right 08/30/2022   Procedure: OPEN REDUCTION INTERNAL FIXATION (ORIF) DISTAL FEMUR FRACTURE;  Surgeon: Celena Sharper, MD;  [facility] OR;  Service: Orthopedics;  Laterality: Right;   POLYPECTOMY  10/22/2020   Procedure: POLYPECTOMY;  Surgeon: Unk Corinn Skiff, MD;  Location: [facility]ERY [facility];  Service: Endoscopy;;   POLYPECTOMY  06/16/2022   Procedure: POLYPECTOMY;  Surgeon: Unk Corinn Skiff, MD;  Location: [facility] SUR[facility]ERY [facility];  Service: Endoscopy;;   ROTATOR CUFF REPAIR Right    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   TOTAL KNEE ARTHROPLASTY Right 09/19/2023   Procedure: RIGHT TOTAL KNEE ARTHROPLASTY;  Surgeon: Vernetta Lonni GRADE, MD;  [facility]: [facility] OR;  Service: Orthopedics;  Laterality: Right;   Past Surgical History:  Procedure Laterality Date   ABDOMINAL EXPOSURE N/A 03/06/2023   Procedure: ABDOMINAL EXPOSURE;  Surgeon: Gretta Lonni PARAS, MD;  Location: [facility] OR;  Service: Vascular;  Laterality: N/A;   ACHILLES TENDON REPAIR Right 12/26/2006   ANTERIOR LUMBAR FUSION N/A 03/06/2023   Procedure: L5-S1 ANTERIOR LUMBAR FUSION 1 LEVEL;  Surgeon: Georgina Sharper LABOR, MD;  [facility];  Service: Orthopedics;  Laterality: N/A;   BIOPSY  08/29/2023   Procedure: BIOPSY;  Surgeon: Unk Corinn Skiff, MD;  Location: [facility] ENDOSCOPY;  Service: Gastroenterology;;   CATARACT EXTRACTION W/PHACO Left 07/20/2021   Procedure: CATARACT EXTRACTION PHACO AND INTRAOCULAR LENS PLACEMENT (IOC) LEFT 2.11 00:24.0;  Surgeon: Jaye Fallow, MD;  Location: [facility] SUR[facility]ERY [facility];  Service: Ophthalmology;  Laterality: Left;  sleep apnea   CATARACT EXTRACTION W/PHACO Right 08/03/2021   Procedure: CATARACT EXTRACTION PHACO AND INTRAOCULAR LENS PLACEMENT (IOC) RIGHT;  Surgeon: Jaye Fallow, MD;  Location: [facility]ERY [facility];  Service: Ophthalmology;  Laterality: Right;  3.19 0:33.0   CHOLECYSTECTOMY     COLON RESECTION Left 12/26/2004   due to diverticular disease at [facility]   COLONOSCOPY  2013?   COLONOSCOPY N/A 11/30/2022   Procedure: COLONOSCOPY;  Surgeon: Unk Corinn Skiff, MD;   Location: [facility] ENDOSCOPY;  Service: Gastroenterology;  Laterality: N/A;   COLONOSCOPY WITH PROPOFOL  N/A 05/22/2018   Procedure: COLONOSCOPY WITH PROPOFOL ;  Surgeon: Unk Corinn Skiff, MD;  Location: [facility] ENDOSCOPY;  Service: Gastroenterology;  Laterality: N/A;   COLONOSCOPY WITH PROPOFOL  N/A 10/22/2020   Procedure: COLONOSCOPY WITH PROPOFOL ;  Surgeon: Unk Corinn Skiff, MD;  Location: [facility] SURGERY CNTR;  Service: Endoscopy;  Laterality: N/A;  priority 4   COLONOSCOPY WITH PROPOFOL  N/A 11/10/2021   Procedure: COLONOSCOPY WITH PROPOFOL ;  Surgeon: Unk Corinn Skiff, MD;  Location: [facility] ENDOSCOPY;  Service: Gastroenterology;  Laterality: N/A;   COLONOSCOPY WITH PROPOFOL  N/A 06/16/2022   Procedure: COLONOSCOPY WITH PROPOFOL ;  Surgeon: Unk Corinn Skiff, MD;  Location: [facility]ERY [facility];  Service: Endoscopy;  Laterality: N/A;  COLONOSCOPY WITH PROPOFOL  N/A 11/29/2022   Procedure: COLONOSCOPY WITH PROPOFOL ;  Surgeon: Unk Corinn Skiff, MD;  Location: [facility] ENDOSCOPY;  Service: Gastroenterology;  Laterality: N/A;   DRUG INDUCED ENDOSCOPY N/A 06/10/2019   Procedure: DRUG INDUCED SLEEP ENDOSCOPY;  Surgeon: Mable Lenis, MD;  Location: [facility];  Service: ENT;  Laterality: N/A;   ESOPHAGOGASTRODUODENOSCOPY (EGD) WITH PROPOFOL  N/A 06/16/2022   Procedure: ESOPHAGOGASTRODUODENOSCOPY (EGD) WITH PROPOFOL ;  Surgeon: Unk Corinn Skiff, MD;  Location: [facility];  Service: Endoscopy;  Laterality: N/A;   ESOPHAGOGASTRODUODENOSCOPY (EGD) WITH PROPOFOL  N/A 05/24/2023   Procedure: ESOPHAGOGASTRODUODENOSCOPY (EGD) WITH PROPOFOL ;  Surgeon: Unk Corinn Skiff, MD;  Location: [facility] ENDOSCOPY;  Service: Gastroenterology;  Laterality: N/A;   ESOPHAGOGASTRODUODENOSCOPY (EGD) WITH PROPOFOL  N/A 08/29/2023   Procedure: ESOPHAGOGASTRODUODENOSCOPY (EGD) WITH PROPOFOL ;  Surgeon: Unk Corinn Skiff, MD;  Location: [facility] ENDOSCOPY;  Service: Gastroenterology;  Laterality: N/A;   GASTRIC  BYPASS N/A 12/27/2007   HARDWARE REMOVAL Right 06/22/2023   Procedure: HARDWARE REMOVAL;  Surgeon: Celena Sharper, MD;  Location: [facility] OR;  Service: Orthopedics;  Laterality: Right;   HARDWARE REMOVAL N/A 01/23/2024   Procedure: REMOVAL OF POSTERIOR SPINAL INSTRUMENTATION;  Surgeon: Georgina Sharper LABOR, MD;  [facility];  Service: Orthopedics;  Laterality: N/A;   HIATAL HERNIA REPAIR  09/25/2014   at [facility]   KNEE ARTHROSCOPY Right 06/22/2023   Procedure: ARTHROSCOPY KNEE;  Surgeon: Celena Sharper, MD;  Location: [facility] OR;  Service: Orthopedics;  Laterality: Right;   ORIF FEMUR FRACTURE Right 08/30/2022   Procedure: OPEN REDUCTION INTERNAL FIXATION (ORIF) DISTAL FEMUR FRACTURE;  Surgeon: Celena Sharper, MD;  [facility];  Service: Orthopedics;  Laterality: Right;   POLYPECTOMY  10/22/2020   Procedure: POLYPECTOMY;  Surgeon: Unk Corinn Skiff, MD;  Location: [facility]ERY [facility];  Service: Endoscopy;;   POLYPECTOMY  06/16/2022   Procedure: POLYPECTOMY;  Surgeon: Unk Corinn Skiff, MD;  Location: [facility] SUR[facility]ERY [facility];  Service: Endoscopy;;   ROTATOR CUFF REPAIR Right    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   TOTAL KNEE ARTHROPLASTY Right 09/19/2023   Procedure: RIGHT TOTAL KNEE ARTHROPLASTY;  Surgeon: Vernetta Lonni GRADE, MD;  Location: [facility] OR;  Service: Orthopedics;  Laterality: Right;   Past Medical History:  Diagnosis Date   Allergic rhinitis due to pollen    Anemia    Anxiety    Chronic pain    Colon polyps    Depression    Diverticulitis large intestine    Erosive esophagitis    GERD (gastroesophageal reflux disease)    HTN (hypertension)    Irritable bowel syndrome    Metabolic bone disease 09/01/2022   Obesity    Osteoarthritis, knee    Sleep apnea    does not use cpap, pt states he no longer needed CPAP due to weight loss   Sleep disturbance    Vitamin D  deficiency 09/01/2022   Ht 5' 10.5 (1.791 m)   Wt 195 lb (88.5 kg)   BMI 27.58 kg/m   Opioid Risk  Score:   Fall Risk Score:  `1  Depression screen [facility] 2/9     07/30/2024    9:02 AM 07/12/2024    2:55 PM 05/23/2024    9:33 AM 04/11/2024   10:01 AM 02/02/2024    9:52 AM 01/05/2024    8:30 AM 07/12/2023   10:06 AM  Depression screen PHQ 2/9  Decreased Interest 1 3 1 1 3 2 3   Down, Depressed, Hopeless 1 3 1 1 3 3  3  PHQ - 2 Score 2 6 2 2 6 5 6   Altered sleeping 3 3   3 3 3   Tired, decreased energy 1 3   2 2 3   Change in appetite 0 0   1 0 2  Feeling bad or failure about yourself  0 0   1 1 1   Trouble concentrating 0 0   1 1 2   Moving slowly or fidgety/restless 0 0   0 2 2  Suicidal thoughts 0 0   0 0 0  PHQ-9 Score 6 12   14 14 19   Difficult doing work/chores Extremely dIfficult Not difficult at all   Very difficult Very difficult Very difficult     Review of Systems  Musculoskeletal:  Positive for back pain.       Right knee pain  All other systems reviewed and are negative.    Objective:   Physical Exam Vitals and nursing note reviewed.  Constitutional:      Appearance: Normal appearance.  Cardiovascular:     Rate and Rhythm: Normal rate and regular rhythm.     Pulses: Normal pulses.     Heart sounds: Normal heart sounds.  Pulmonary:     Effort: Pulmonary effort is normal.     Breath sounds: Normal breath sounds.  Musculoskeletal:     Comments: Normal Muscle Bulk and Muscle Testing Reveals:  Upper Extremities:Full ROM and Muscle Strength 5/5   Lumbar Paraspinal Tenderness: L-4-L-5 Thoracic Paraspinal Tenderness: T-7-T-10 Lower Extremities: Full ROM and Muscle Strength 5/5 Arises from Table with Ease Narrow Based  Gait     Skin:    General: Skin is warm and dry.  Neurological:     Mental Status: He is alert and oriented to person, place, and time.  Psychiatric:        Mood and Affect: Mood normal.        Behavior: Behavior normal.          Assessment & Plan:  Chronic Bilateral low back pain without Sciatica: Continue HEP as tolerated. Continue to  Monitor. 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 08/22/2024 3. Insomnia: Continue Trazodone . Continue to Monitor. 08/22/2024 4. Fall at Home subsequent encounter: No Falls this month. Educated on [employer], he verbalizes understanding. 08/22/2024 5. Chronic Pain Syndrome: RX: Increased: Xtampza  18 mg one capsule every 12 hours #60 and Oxycodone  7.5/325 mg one tablet every 6 hours as needed for #120. We will continue the opioid monitoring program, this consists of regular clinic visits, examinations, urine drug screen, pill counts as well as use of [place]  Controlled Substance Reporting system. A 12 month History has been reviewed on the [place]  Controlled Substance Reporting System on 08/22/2024   F/U in 1 month

## 2024-08-27 ENCOUNTER — Inpatient Hospital Stay: Attending: Oncology

## 2024-08-27 DIAGNOSIS — K921 Melena: Secondary | ICD-10-CM | POA: Diagnosis not present

## 2024-08-27 DIAGNOSIS — D509 Iron deficiency anemia, unspecified: Secondary | ICD-10-CM | POA: Insufficient documentation

## 2024-08-27 DIAGNOSIS — D508 Other iron deficiency anemias: Secondary | ICD-10-CM

## 2024-08-27 DIAGNOSIS — Z9884 Bariatric surgery status: Secondary | ICD-10-CM | POA: Insufficient documentation

## 2024-08-27 DIAGNOSIS — Z801 Family history of malignant neoplasm of trachea, bronchus and lung: Secondary | ICD-10-CM | POA: Diagnosis not present

## 2024-08-27 DIAGNOSIS — Z800 Family history of malignant neoplasm of digestive organs: Secondary | ICD-10-CM | POA: Insufficient documentation

## 2024-08-27 DIAGNOSIS — Z79899 Other long term (current) drug therapy: Secondary | ICD-10-CM | POA: Insufficient documentation

## 2024-08-27 DIAGNOSIS — Z87891 Personal history of nicotine dependence: Secondary | ICD-10-CM | POA: Diagnosis not present

## 2024-08-27 LAB — RETIC PANEL
Immature Retic Fract: 10.4 % (ref 2.3–15.9)
RBC.: 4.68 MIL/uL (ref 4.22–5.81)
Retic Count, Absolute: 52.9 K/uL (ref 19.0–186.0)
Retic Ct Pct: 1.1 % (ref 0.4–3.1)
Reticulocyte Hemoglobin: 34.9 pg (ref >27.9)

## 2024-08-27 LAB — CBC WITH DIFFERENTIAL (CANCER CENTER ONLY)
Abs Immature Granulocytes: 0.02 K/uL (ref 0.00–0.07)
Basophils Absolute: 0 K/uL (ref 0.0–0.1)
Basophils Relative: 1 %
Eosinophils Absolute: 0.1 K/uL (ref 0.0–0.5)
Eosinophils Relative: 1 %
HCT: 44.2 % (ref 39.0–52.0)
Hemoglobin: 14.3 g/dL (ref 13.0–17.0)
Immature Granulocytes: 0 %
Lymphocytes Relative: 23 %
Lymphs Abs: 1.5 K/uL (ref 0.7–4.0)
MCH: 30.6 pg (ref 26.0–34.0)
MCHC: 32.4 g/dL (ref 30.0–36.0)
MCV: 94.6 fL (ref 80.0–100.0)
Monocytes Absolute: 0.6 K/uL (ref 0.1–1.0)
Monocytes Relative: 9 %
Neutro Abs: 4.3 K/uL (ref 1.7–7.7)
Neutrophils Relative %: 66 %
Platelet Count: 301 K/uL (ref 150–400)
RBC: 4.67 MIL/uL (ref 4.22–5.81)
RDW: 15.5 % (ref 11.5–15.5)
WBC Count: 6.5 K/uL (ref 4.0–10.5)
nRBC: 0 % (ref 0.0–0.2)

## 2024-08-27 LAB — IRON AND TIBC
Iron: 96 ug/dL (ref 45–182)
Saturation Ratios: 21 % (ref 17.9–39.5)
TIBC: 451 ug/dL — ABNORMAL HIGH (ref 250–450)
UIBC: 355 ug/dL

## 2024-08-27 LAB — FOLATE: Folate: 15.3 ng/mL (ref >5.9)

## 2024-08-27 LAB — VITAMIN B12: Vitamin B-12: 750 pg/mL (ref 180–914)

## 2024-08-27 LAB — FERRITIN: Ferritin: 40 ng/mL (ref 24–336)

## 2024-08-28 ENCOUNTER — Ambulatory Visit: Admitting: Orthopaedic Surgery

## 2024-09-01 ENCOUNTER — Encounter: Payer: Self-pay | Admitting: Registered Nurse

## 2024-09-03 ENCOUNTER — Telehealth: Payer: Self-pay

## 2024-09-03 ENCOUNTER — Encounter: Payer: Self-pay | Admitting: Oncology

## 2024-09-03 ENCOUNTER — Inpatient Hospital Stay

## 2024-09-03 ENCOUNTER — Inpatient Hospital Stay (HOSPITAL_BASED_OUTPATIENT_CLINIC_OR_DEPARTMENT_OTHER): Admitting: Oncology

## 2024-09-03 VITALS — BP 126/89 | HR 71 | Temp 97.3°F | Resp 18 | Wt 200.6 lb

## 2024-09-03 DIAGNOSIS — K921 Melena: Secondary | ICD-10-CM | POA: Insufficient documentation

## 2024-09-03 DIAGNOSIS — D509 Iron deficiency anemia, unspecified: Secondary | ICD-10-CM | POA: Diagnosis not present

## 2024-09-03 DIAGNOSIS — Z9884 Bariatric surgery status: Secondary | ICD-10-CM | POA: Diagnosis not present

## 2024-09-03 DIAGNOSIS — D508 Other iron deficiency anemias: Secondary | ICD-10-CM | POA: Diagnosis not present

## 2024-09-03 DIAGNOSIS — Z87891 Personal history of nicotine dependence: Secondary | ICD-10-CM | POA: Diagnosis not present

## 2024-09-03 DIAGNOSIS — Z801 Family history of malignant neoplasm of trachea, bronchus and lung: Secondary | ICD-10-CM | POA: Diagnosis not present

## 2024-09-03 DIAGNOSIS — Z800 Family history of malignant neoplasm of digestive organs: Secondary | ICD-10-CM | POA: Diagnosis not present

## 2024-09-03 DIAGNOSIS — Z79899 Other long term (current) drug therapy: Secondary | ICD-10-CM | POA: Diagnosis not present

## 2024-09-03 NOTE — Assessment & Plan Note (Signed)
 Small amount of bring red blood.  EGD/colonoscopy uptodate.  Family history of colorectal cancer.  Recommend patient to schedule follow up appointment with GI

## 2024-09-03 NOTE — Progress Notes (Signed)
 Hematology/Oncology Progress note [phone] [phone]        CHIEF COMPLAINTS/REASON FOR VISIT:  Follow-up for iron  deficiency anemia.   ASSESSMENT & PLAN:   Iron  deficiency anemia #Iron  deficiency anemia, history of gastri bypas.  previously tolerated IV Venofer  treatments. Labs reviewed and discussed with patient Lab Results  Component Value Date   HGB 14.3 08/27/2024   TIBC 451 (H) 08/27/2024   IRONPCTSAT 21 08/27/2024   FERRITIN 40 08/27/2024    Stable hemoglobin continue oral ferrous sulfate  supplementation every other day.   Personal hx of gastric bypass EGD/colonoscopy up-to-date.  Monitor B12 Folate levels periodically.   Blood in stool Small amount of bring red blood.  EGD/colonoscopy uptodate.  Family history of colorectal cancer.  Recommend patient to schedule follow up appointment with GI   Orders Placed This Encounter  Procedures   CBC with Differential (Cancer Center Only)    Standing Status:   Future    Expected Date:   01/03/2025    Expiration Date:   04/03/2025   Iron  and TIBC    Standing Status:   Future    Expected Date:   01/03/2025    Expiration Date:   04/03/2025   Ferritin    Standing Status:   Future    Expected Date:   01/03/2025    Expiration Date:   04/03/2025   Follow up in 4 months.  All questions were answered. The patient knows to call the clinic with any problems, questions or concerns.  Zelphia Cap, MD, PhD [facility] Hematology Oncology 09/03/2024     HISTORY OF PRESENTING ILLNESS:   Harold Smith is a  58 y.o.  male with PMH listed below was seen in consultation at the request of  Jimmy Ade I, MD  for evaluation of anemia  05/03/2022, patient had preop blood work done showed hemoglobin of 8.3, hematocrit 75.  Platelet count 258, white count 8.5.  Patient had plan of back surgery which was aborted due to decreased hemoglobin. Reviewed his previous lab records 01/30/2022, hemoglobin 10, MCV  88.6, 07/02/2021, hemoglobin 11, MCV 90.2. Patient denies any black or bloody stool.  Denies any unintentional weight loss, night sweats, fever, epigastric discomfort. Patient takes Mobic  as needed for pain. 11/10/2021, colonoscopy showed 2 small polyps in the proximal transverse colon.  Pathology showed tubular adenoma. Family history is positive for father who passed away from colorectal cancer at age of 64.  11/30/22 colonoscopy showed  Six 3 to 6 mm polyps in the descending colon, in the transverse colon and in the cecum, removed with a cold snare. Resected and retrieved.- The distal rectum and anal verge are normal on retroflexion view Pathology showed multiple tubular adenoma, hyperplastic polyp. Negative for dysplasia and malignancy  .  INTERVAL HISTORY Harold Smith is a 58 y.o. male who has above history reviewed by me today presents for follow up visit for iron  deficiency anemia. he previously tolerated IV Venofer  treatments, he tolerates well.  No new complaints. He denies dark tarry stool, abdominal pain. Small blood in stool, bright red blood on surface of stool. He denies constipation.   Review of Systems  Constitutional:  Negative for appetite change, chills, fatigue, fever and unexpected weight change.  HENT:   Negative for hearing loss and voice change.   Eyes:  Negative for eye problems and icterus.  Respiratory:  Negative for chest tightness, cough and shortness of breath.   Cardiovascular:  Negative for chest pain and leg swelling.  Gastrointestinal:  Positive for blood in stool. Negative for abdominal distention, abdominal pain, constipation, nausea, rectal pain and vomiting.  Endocrine: Negative for hot flashes.  Genitourinary:  Negative for difficulty urinating, dysuria and frequency.   Musculoskeletal:  Positive for back pain. Negative for arthralgias.  Skin:  Negative for itching and rash.  Neurological:  Negative for light-headedness and numbness.   Hematological:  Negative for adenopathy. Does not bruise/bleed easily.  Psychiatric/Behavioral:  Negative for confusion.     MEDICAL HISTORY:  Past Medical History:  Diagnosis Date   Allergic rhinitis due to pollen    Anemia    Anxiety    Chronic pain    Colon polyps    Depression    Diverticulitis large intestine    Erosive esophagitis    GERD (gastroesophageal reflux disease)    HTN (hypertension)    Irritable bowel syndrome    Metabolic bone disease 09/01/2022   Obesity    Osteoarthritis, knee    Sleep apnea    does not use cpap, pt states he no longer needed CPAP due to weight loss   Sleep disturbance    Vitamin D  deficiency 09/01/2022    SURGICAL HISTORY: Past Surgical History:  Procedure Laterality Date   ABDOMINAL EXPOSURE N/A 03/06/2023   Procedure: ABDOMINAL EXPOSURE;  Surgeon: Gretta Lonni PARAS, MD;  Location: [facility] OR;  Service: Vascular;  Laterality: N/A;   ACHILLES TENDON REPAIR Right 12/26/2006   ANTERIOR LUMBAR FUSION N/A 03/06/2023   Procedure: L5-S1 ANTERIOR LUMBAR FUSION 1 LEVEL;  Surgeon: Georgina Ozell LABOR, MD;  Location: [facility] OR;  Service: Orthopedics;  Laterality: N/A;   BIOPSY  08/29/2023   Procedure: BIOPSY;  Surgeon: Unk Corinn Skiff, MD;  Location: [facility] ENDOSCOPY;  Service: Gastroenterology;;   CATARACT EXTRACTION W/PHACO Left 07/20/2021   Procedure: CATARACT EXTRACTION PHACO AND INTRAOCULAR LENS PLACEMENT (IOC) LEFT 2.11 00:24.0;  Surgeon: Jaye Fallow, MD;  Location: [facility];  Service: Ophthalmology;  Laterality: Left;  sleep apnea   CATARACT EXTRACTION W/PHACO Right 08/03/2021   Procedure: CATARACT EXTRACTION PHACO AND INTRAOCULAR LENS PLACEMENT (IOC) RIGHT;  Surgeon: Jaye Fallow, MD;  Location: [facility];  Service: Ophthalmology;  Laterality: Right;  3.19 0:33.0   CHOLECYSTECTOMY     COLON RESECTION Left 12/26/2004   due to diverticular disease at [facility]   COLONOSCOPY  2013?   COLONOSCOPY N/A 11/30/2022    Procedure: COLONOSCOPY;  Surgeon: Unk Corinn Skiff, MD;  Location: [facility] ENDOSCOPY;  Service: Gastroenterology;  Laterality: N/A;   COLONOSCOPY WITH PROPOFOL  N/A 05/22/2018   Procedure: COLONOSCOPY WITH PROPOFOL ;  Surgeon: Unk Corinn Skiff, MD;  Location: [facility] ENDOSCOPY;  Service: Gastroenterology;  Laterality: N/A;   COLONOSCOPY WITH PROPOFOL  N/A 10/22/2020   Procedure: COLONOSCOPY WITH PROPOFOL ;  Surgeon: Unk Corinn Skiff, MD;  Location: [facility];  Service: Endoscopy;  Laterality: N/A;  priority 4   COLONOSCOPY WITH PROPOFOL  N/A 11/10/2021   Procedure: COLONOSCOPY WITH PROPOFOL ;  Surgeon: Unk Corinn Skiff, MD;  Location: [facility] ENDOSCOPY;  Service: Gastroenterology;  Laterality: N/A;   COLONOSCOPY WITH PROPOFOL  N/A 06/16/2022   Procedure: COLONOSCOPY WITH PROPOFOL ;  Surgeon: Unk Corinn Skiff, MD;  Location: [facility];  Service: Endoscopy;  Laterality: N/A;   COLONOSCOPY WITH PROPOFOL  N/A 11/29/2022   Procedure: COLONOSCOPY WITH PROPOFOL ;  Surgeon: Unk Corinn Skiff, MD;  Location: [facility] ENDOSCOPY;  Service: Gastroenterology;  Laterality: N/A;   DRUG INDUCED ENDOSCOPY N/A 06/10/2019   Procedure: DRUG INDUCED SLEEP ENDOSCOPY;  Surgeon: Mable Lenis, MD;  Location: [facility];  Service: ENT;  Laterality: N/A;   ESOPHAGOGASTRODUODENOSCOPY (EGD) WITH PROPOFOL  N/A 06/16/2022   Procedure: ESOPHAGOGASTRODUODENOSCOPY (EGD) WITH PROPOFOL ;  Surgeon: Unk Corinn Skiff, MD;  Location: [facility];  Service: Endoscopy;  Laterality: N/A;   ESOPHAGOGASTRODUODENOSCOPY (EGD) WITH PROPOFOL  N/A 05/24/2023   Procedure: ESOPHAGOGASTRODUODENOSCOPY (EGD) WITH PROPOFOL ;  Surgeon: Unk Corinn Skiff, MD;  Location: [facility] ENDOSCOPY;  Service: Gastroenterology;  Laterality: N/A;   ESOPHAGOGASTRODUODENOSCOPY (EGD) WITH PROPOFOL  N/A 08/29/2023   Procedure: ESOPHAGOGASTRODUODENOSCOPY (EGD) WITH PROPOFOL ;  Surgeon: Unk Corinn Skiff, MD;  Location: [facility] ENDOSCOPY;   Service: Gastroenterology;  Laterality: N/A;   GASTRIC BYPASS N/A 12/27/2007   HARDWARE REMOVAL Right 06/22/2023   Procedure: HARDWARE REMOVAL;  Surgeon: Celena Sharper, MD;  Location: [facility] OR;  Service: Orthopedics;  Laterality: Right;   HARDWARE REMOVAL N/A 01/23/2024   Procedure: REMOVAL OF POSTERIOR SPINAL INSTRUMENTATION;  Surgeon: Georgina Sharper LABOR, MD;  Location: [facility] OR;  Service: Orthopedics;  Laterality: N/A;   HIATAL HERNIA REPAIR  09/25/2014   at [facility]   KNEE ARTHROSCOPY Right 06/22/2023   Procedure: ARTHROSCOPY KNEE;  Surgeon: Celena Sharper, MD;  Location: [facility] OR;  Service: Orthopedics;  Laterality: Right;   ORIF FEMUR FRACTURE Right 08/30/2022   Procedure: OPEN REDUCTION INTERNAL FIXATION (ORIF) DISTAL FEMUR FRACTURE;  Surgeon: Celena Sharper, MD;  Location: [facility] OR;  Service: Orthopedics;  Laterality: Right;   POLYPECTOMY  10/22/2020   Procedure: POLYPECTOMY;  Surgeon: Unk Corinn Skiff, MD;  Location: [facility];  Service: Endoscopy;;   POLYPECTOMY  06/16/2022   Procedure: POLYPECTOMY;  Surgeon: Unk Corinn Skiff, MD;  Location: [facility];  Service: Endoscopy;;   ROTATOR CUFF REPAIR Right    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   TOTAL KNEE ARTHROPLASTY Right 09/19/2023   Procedure: RIGHT TOTAL KNEE ARTHROPLASTY;  Surgeon: Vernetta Lonni GRADE, MD;  Location: [facility] OR;  Service: Orthopedics;  Laterality: Right;    SOCIAL HISTORY: Social History   Socioeconomic History   Marital status: Single    Spouse name: Not on file   Number of children: 3   Years of education: Not on file   Highest education level: Some college, no degree  Occupational History   Occupation: Hydrographic surveyor    End: 89/68/7977  Tobacco Use   Smoking status: Former    Types: Cigars    Passive exposure: Past   Smokeless tobacco: Never   Tobacco comments:    occasional cigar  Vaping Use   Vaping status: Never Used  Substance and Sexual Activity   Alcohol  use: Yes    Comment: Occasionally   Drug use: No   Sexual activity: Yes  Other Topics Concern   Not on file  Social History Narrative   Jehovah's witness--no blood products   No formal living will   Daughter Dela should be health care POA   Would accept resuscitation   Would accept feeding tube---and then leave it up to family   Social Drivers of Health   Financial Resource Strain: Medium Risk (07/14/2024)   Overall Financial Resource Strain (CARDIA)    Difficulty of Paying Living Expenses: Somewhat hard  Food Insecurity: Food Insecurity Present (07/14/2024)   Hunger Vital Sign    Worried About Running Out of Food in the Last Year: Sometimes true    Ran Out of Food in the Last Year: Sometimes true  Transportation Needs: No Transportation Needs (07/14/2024)   PRAPARE - Administrator, Civil Service (Medical): No  Lack of Transportation (Non-Medical): No  Physical Activity: Inactive (07/14/2024)   Exercise Vital Sign    Days of Exercise per Week: 0 days    Minutes of Exercise per Session: Not on file  Stress: Stress Concern Present (07/14/2024)   [employer] of Occupational Health - Occupational Stress Questionnaire    Feeling of Stress: Very much  Social Connections: Socially Isolated (07/14/2024)   Social Connection and Isolation Panel    Frequency of Communication with Friends and Family: More than three times a week    Frequency of Social Gatherings with Friends and Family: Once a week    Attends Religious Services: Never    Database administrator or Organizations: No    Attends Engineer, structural: Not on file    Marital Status: Divorced  Intimate Partner Violence: Not At Risk (07/12/2024)   Humiliation, Afraid, Rape, and Kick questionnaire    Fear of Current or Ex-Partner: No    Emotionally Abused: No    Physically Abused: No    Sexually Abused: No    FAMILY HISTORY: Family History  Problem Relation Age of Onset   Hypertension Mother     Diabetes Mother    Kidney disease Mother    Heart disease Mother    Colon cancer Father 53   Asthma Sister    Obesity Sister    Stroke Sister    Obesity Brother    Diabetes Other    Heart disease Other    Hypertension Other    Kidney disease Other    Cancer Paternal Uncle        unk type, possible prostate   Lung cancer Paternal Grandmother     ALLERGIES:  has no known allergies.  MEDICATIONS:  Current Outpatient Medications  Medication Sig Dispense Refill   alendronate  (FOSAMAX ) 70 MG tablet Take 1 tablet (70 mg total) by mouth once a week. Take with a full glass of water  on an empty stomach. 13 tablet 3   baclofen  (LIORESAL ) 10 MG tablet Take 10 mg by mouth 3 (three) times daily.     calcitRIOL  (ROCALTROL ) 0.25 MCG capsule Take 1 capsule (0.25 mcg total) by mouth daily. 90 capsule 2   CALCIUM  PO Take 1 tablet by mouth daily.     CAPVAXIVE 0.5 ML injection      cetirizine  (ZYRTEC ) 10 MG tablet Take 1 tablet (10 mg total) by mouth daily. 90 tablet 3   Cyanocobalamin  (VITAMIN B-12 PO) Take 1 tablet by mouth daily.     dicyclomine  (BENTYL ) 10 MG capsule Take 1 to 2 capsules as needed for abdominal cramps and diarrhea every 6 to 8 hours 240 capsule 3   DULoxetine  (CYMBALTA ) 60 MG capsule Take 1 capsule by mouth once daily 180 capsule 3   ferrous sulfate  (FEROSUL) 325 (65 FE) MG tablet TAKE ONE TABLET EACH MORNING WITH BREAKFAST 30 tablet 11   fluticasone  (FLONASE ) 50 MCG/ACT nasal spray Place 2 sprays into both nostrils daily as needed for allergies or rhinitis. 48 g 3   gabapentin  (NEURONTIN ) 300 MG capsule TAKE 1 CAPSULE BY MOUTH TWICE DAILY AND TAKE 4 CAPSULES AT BEDTIME 540 capsule 3   hydrochlorothiazide  (HYDRODIURIL ) 25 MG tablet Take 1 tablet (25 mg total) by mouth daily. 90 tablet 3   lipase/protease/amylase (CREON ) 36000 UNITS CPEP capsule Take 2 capsules (72,000 Units total) by mouth 3 (three) times daily with meals. May also take 1 capsule (36,000 Units total) as needed  (with snacks). 720 capsule 3  lisinopril  (ZESTRIL ) 20 MG tablet Take 1 tablet (20 mg total) by mouth daily. 90 tablet 3   meloxicam  (MOBIC ) 15 MG tablet Take 1 tablet (15 mg total) by mouth daily as needed. for pain 90 tablet 1   methocarbamol  (ROBAXIN ) 500 MG tablet Take 1 tablet (500 mg total) by mouth every 6 (six) hours as needed (muscle spasms, pain). 50 tablet 2   Multiple Vitamin (MULTIVITAMIN) tablet Take 1 tablet by mouth daily.     naloxone  (NARCAN ) nasal spray 4 mg/0.1 mL In the event of opiod overdose 1 each 0   Needle, Disp, (HYPODERMIC NEEDLE 22GX1-1/2) 22G X 1-1/2 MISC 1 each by Does not apply route every 14 (fourteen) days. 2 each 12   NEEDLE, DISP, 18 G (BD DISP NEEDLES) 18G X 1-1/2 MISC 2 each by Does not apply route every 14 (fourteen) days. 12 each 6   omeprazole  (PRILOSEC) 40 MG capsule Take 1 capsule (40 mg total) by mouth 2 (two) times daily before a meal. 180 capsule 3   oxyCODONE  ER (XTAMPZA  ER) 18 MG C12A Take 1 capsule by mouth every 12 (twelve) hours. 60 capsule 0   oxyCODONE -acetaminophen  (PERCOCET) 7.5-325 MG tablet Take 1 tablet by mouth every 6 (six) hours as needed for severe pain (pain score 7-10). 120 tablet 0   Sodium Fluoride (PREVIDENT 5000 BOOSTER PLUS DT) Place 1 application  onto teeth 2 (two) times daily.     Syringe, Disposable, 3 ML MISC 1 each by Does not apply route every 14 (fourteen) days. 6 each 6   SYRINGE-NEEDLE, DISP, 3 ML (LUER LOCK SAFETY SYRINGES) 22G X 1-1/2 3 ML MISC Inject 1 each into the muscle every 14 (fourteen) days. 7 each 3   testosterone  cypionate (DEPOTESTOSTERONE CYPIONATE) 200 MG/ML injection Inject 1 mL (200 mg total) into the muscle every 14 (fourteen) days. 10 mL 3   traZODone  (DESYREL ) 50 MG tablet Take 1-2 tablets (50-100 mg total) by mouth at bedtime as needed for sleep. 180 tablet 3   Vitamin D , Ergocalciferol , (DRISDOL ) 1.25 MG (50000 UNIT) CAPS capsule Take 1 capsule (50,000 Units total) by mouth every 7 (seven) days.  13 capsule 3   No current facility-administered medications for this visit.     PHYSICAL EXAMINATION: ECOG PERFORMANCE STATUS: 1 - Symptomatic but completely ambulatory Vitals:   09/03/24 1016  BP: 126/89  Pulse: 71  Resp: 18  Temp: (!) 97.3 F (36.3 C)  SpO2: 100%   Filed Weights   09/03/24 1016  Weight: 200 lb 9.6 oz (91 kg)    Physical Exam HENT:     Head: Normocephalic and atraumatic.  Eyes:     General: No scleral icterus. Cardiovascular:     Rate and Rhythm: Normal rate.  Pulmonary:     Effort: Pulmonary effort is normal. No respiratory distress.     Breath sounds: No wheezing.  Abdominal:     General: Bowel sounds are normal. There is no distension.     Palpations: Abdomen is soft.  Musculoskeletal:        General: No deformity. Normal range of motion.     Cervical back: Normal range of motion and neck supple.  Skin:    General: Skin is warm and dry.     Findings: No erythema or rash.  Neurological:     Mental Status: He is alert and oriented to person, place, and time. Mental status is at baseline.     Cranial Nerves: No cranial nerve deficit.  Psychiatric:  Mood and Affect: Mood normal.     LABORATORY DATA:  I have reviewed the data as listed    Latest Ref Rng & Units 08/27/2024    9:36 AM 07/15/2024    9:51 AM 04/23/2024    9:42 AM  CBC  WBC 4.0 - 10.5 K/uL 6.5  6.1  5.7   Hemoglobin 13.0 - 17.0 g/dL 85.6  86.2  87.4   Hematocrit 39.0 - 52.0 % 44.2  41.8  38.9   Platelets 150 - 400 K/uL 301  253.0  258       Latest Ref Rng & Units 07/15/2024    9:51 AM 04/22/2024    9:25 AM 01/17/2024    9:34 AM  CMP  Glucose 70 - 99 mg/dL 75   894   BUN 6 - 23 mg/dL 18   13   Creatinine 9.59 - 1.50 mg/dL 9.01   9.11   Sodium 864 - 145 mEq/L 139   136   Potassium 3.5 - 5.1 mEq/L 4.1   4.3   Chloride 96 - 112 mEq/L 107   104   CO2 19 - 32 mEq/L 25   24   Calcium  8.4 - 10.5 mg/dL 8.9  8.6  9.2   Total Protein 6.0 - 8.3 g/dL 6.7     Total Bilirubin  0.2 - 1.2 mg/dL 0.4     Alkaline Phos 39 - 117 U/L 63     AST 0 - 37 U/L 18     ALT 0 - 53 U/L 19        Iron /TIBC/Ferritin/ %Sat    Component Value Date/Time   IRON  96 08/27/2024 0936   TIBC 451 (H) 08/27/2024 0936   FERRITIN 40 08/27/2024 0936   IRONPCTSAT 21 08/27/2024 0936      RADIOGRAPHIC STUDIES: I have personally reviewed the radiological images as listed and agreed with the findings in the report. No results found.

## 2024-09-03 NOTE — Assessment & Plan Note (Signed)
 EGD/colonoscopy up-to-date.  Monitor B12 Folate levels periodically.

## 2024-09-03 NOTE — Assessment & Plan Note (Addendum)
#  Iron  deficiency anemia, history of gastri bypas.  previously tolerated IV Venofer  treatments. Labs reviewed and discussed with patient Lab Results  Component Value Date   HGB 14.3 08/27/2024   TIBC 451 (H) 08/27/2024   IRONPCTSAT 21 08/27/2024   FERRITIN 40 08/27/2024    Stable hemoglobin continue oral ferrous sulfate  supplementation every other day.

## 2024-09-04 ENCOUNTER — Telehealth: Payer: Self-pay | Admitting: Internal Medicine

## 2024-09-04 ENCOUNTER — Telehealth: Payer: Self-pay | Admitting: Registered Nurse

## 2024-09-04 NOTE — Telephone Encounter (Signed)
 Copied from CRM [id number]. Topic: Clinical - Medication Question >> Sep 03, 2024  3:05 PM Antwanette L wrote: Reason for CRM: Alfonso from [employer] called b/c she needs clarification on lipase/protease/amylase (CREON ) 36000 UNITS CPEP capsule.The pharmacy wants to  confirm the total amount of daily capsule.Please Careers information officer Rx Pharmacy at [phone].

## 2024-09-04 NOTE — Telephone Encounter (Signed)
 Robbin,  You sent a message regarding patient medication. If you look at his medication list, you can see the medication was already sent, with a do not fill date noted on the prescription. Patient needs to call the pharmacy.

## 2024-09-06 NOTE — Telephone Encounter (Signed)
 Called and updated pharmacy no further questions

## 2024-09-10 NOTE — Telephone Encounter (Signed)
Task sent. 

## 2024-09-11 ENCOUNTER — Telehealth: Payer: Self-pay

## 2024-09-11 NOTE — Telephone Encounter (Signed)
 Redell from [employer] called stating that insurance is requiring a body map of patient.  Ph#[phone] Email: [email or web address]

## 2024-09-16 DIAGNOSIS — I10 Essential (primary) hypertension: Secondary | ICD-10-CM | POA: Diagnosis not present

## 2024-09-16 DIAGNOSIS — G8921 Chronic pain due to trauma: Secondary | ICD-10-CM | POA: Diagnosis not present

## 2024-09-20 ENCOUNTER — Encounter: Payer: Self-pay | Admitting: Registered Nurse

## 2024-09-20 ENCOUNTER — Encounter: Attending: Registered Nurse | Admitting: Registered Nurse

## 2024-09-20 VITALS — BP 144/97 | HR 70 | Ht 70.0 in | Wt 207.4 lb

## 2024-09-20 DIAGNOSIS — G8929 Other chronic pain: Secondary | ICD-10-CM | POA: Insufficient documentation

## 2024-09-20 DIAGNOSIS — G894 Chronic pain syndrome: Secondary | ICD-10-CM | POA: Diagnosis not present

## 2024-09-20 DIAGNOSIS — Z96659 Presence of unspecified artificial knee joint: Secondary | ICD-10-CM | POA: Insufficient documentation

## 2024-09-20 DIAGNOSIS — Z79899 Other long term (current) drug therapy: Secondary | ICD-10-CM | POA: Diagnosis not present

## 2024-09-20 DIAGNOSIS — M546 Pain in thoracic spine: Secondary | ICD-10-CM | POA: Insufficient documentation

## 2024-09-20 DIAGNOSIS — M25569 Pain in unspecified knee: Secondary | ICD-10-CM | POA: Insufficient documentation

## 2024-09-20 DIAGNOSIS — Z5181 Encounter for therapeutic drug level monitoring: Secondary | ICD-10-CM | POA: Insufficient documentation

## 2024-09-20 DIAGNOSIS — M545 Low back pain, unspecified: Secondary | ICD-10-CM | POA: Insufficient documentation

## 2024-09-20 MED ORDER — XTAMPZA ER 18 MG PO C12A: 1.0000 | EXTENDED_RELEASE_CAPSULE | Freq: Two times a day (BID) | ORAL | 0 refills | Status: DC

## 2024-09-20 MED ORDER — OXYCODONE-ACETAMINOPHEN 7.5-325 MG PO TABS: 1.0000 | ORAL_TABLET | Freq: Four times a day (QID) | ORAL | 0 refills | Status: DC | PRN

## 2024-09-20 NOTE — Assessment & Plan Note (Addendum)
 Chronic ED On tadalafil  20mg  PRN  Hx of priapism, on trazadone  New Rx for sildenafil  100mg  PRN sent today   - 20mg  tadalafil  not covered by insurance. He could also shop via compounding pharmacies or pay-out of pocket through online retailer Patient may consolidate care through PCP for refills  I am happy to see him again if worsening ED, or new clinical changes

## 2024-09-20 NOTE — Progress Notes (Signed)
 Subjective:    Patient ID: Harold Smith, male    DOB: 1966-01-29, 58 y.o.   MRN: [id number]  HPI: Harold Smith is a 58 y.o. male who returns for follow up appointment for chronic pain and medication refill. He states his pain is located in his mid- lower back and right knee pain. He rates his pain 6. His current exercise regime is walking and performing stretching exercises.  [name]  equivalent is 105.00 MME.   Last UDS was Performed on 07/30/2024, it was consistent.     Pain Inventory Average Pain 6 Pain Right Now 6 My pain is constant, sharp, burning, dull, stabbing, tingling, and aching  In the last 24 hours, has pain interfered with the following? General activity 8 Relation with others 7 Enjoyment of life 8 What TIME of day is your pain at its worst? morning , daytime, evening, and night Sleep (in general) Poor  Pain is worse with: walking, bending, sitting, standing, and some activites Pain improves with: medication Relief from Meds: 8  Family History  Problem Relation Age of Onset   Hypertension Mother    Diabetes Mother    Kidney disease Mother    Heart disease Mother    Colon cancer Father 20   Asthma Sister    Obesity Sister    Stroke Sister    Obesity Brother    Diabetes Other    Heart disease Other    Hypertension Other    Kidney disease Other    Cancer Paternal Uncle        unk type, possible prostate   Lung cancer Paternal Grandmother    Social History   Socioeconomic History   Marital status: Single    Spouse name: Not on file   Number of children: 3   Years of education: Not on file   Highest education level: Some college, no degree  Occupational History   Occupation: Hydrographic surveyor    End: 89/68/7977  Tobacco Use   Smoking status: Former    Types: Cigars    Passive exposure: Past   Smokeless tobacco: Never   Tobacco comments:    occasional cigar  Vaping Use   Vaping status: Never Used  Substance and  Sexual Activity   Alcohol use: Yes    Comment: Occasionally   Drug use: No   Sexual activity: Yes  Other Topics Concern   Not on file  Social History Narrative   Jehovah's witness--no blood products   No formal living will   Daughter Dela should be health care POA   Would accept resuscitation   Would accept feeding tube---and then leave it up to family   Social Drivers of Health   Financial Resource Strain: Medium Risk (07/14/2024)   Overall Financial Resource Strain (CARDIA)    Difficulty of Paying Living Expenses: Somewhat hard  Food Insecurity: Food Insecurity Present (07/14/2024)   Hunger Vital Sign    Worried About Running Out of Food in the Last Year: Sometimes true    Ran Out of Food in the Last Year: Sometimes true  Transportation Needs: No Transportation Needs (07/14/2024)   PRAPARE - Administrator, Civil Service (Medical): No    Lack of Transportation (Non-Medical): No  Physical Activity: Inactive (07/14/2024)   Exercise Vital Sign    Days of Exercise per Week: 0 days    Minutes of Exercise per Session: Not on file  Stress: Stress Concern Present (07/14/2024)   [employer] of  Occupational Health - Occupational Stress Questionnaire    Feeling of Stress: Very much  Social Connections: Socially Isolated (07/14/2024)   Social Connection and Isolation Panel    Frequency of Communication with Friends and Family: More than three times a week    Frequency of Social Gatherings with Friends and Family: Once a week    Attends Religious Services: Never    Database administrator or Organizations: No    Attends Engineer, structural: Not on file    Marital Status: Divorced   Past Surgical History:  Procedure Laterality Date   ABDOMINAL EXPOSURE N/A 03/06/2023   Procedure: ABDOMINAL EXPOSURE;  Surgeon: Gretta Lonni PARAS, MD;  Location[facility] OR;  Service: Vascular;  Laterality: N/A;   ACHILLES TENDON REPAIR Right 12/26/2006   ANTERIOR LUMBAR FUSION N/A  03/06/2023   Procedure: L5-S1 ANTERIOR LUMBAR FUSION 1 LEVEL;  Surgeon: Georgina Ozell LABOR, MD;  [facility]: [facility] OR;  Service: Orthopedics;  Laterality: N/A;   BIOPSY  08/29/2023   Procedure: BIOPSY;  Surgeon: Unk Corinn Skiff, MD;  Location: [facility] ENDOSCOPY;  Service: Gastroenterology;;   CATARACT EXTRACTION W/PHACO Left 07/20/2021   Procedure: CATARACT EXTRACTION PHACO AND INTRAOCULAR LENS PLACEMENT (IOC) LEFT 2.11 00:24.0;  Surgeon: Jaye Fallow, MD;  Location: [facility] SURGERY [facility]TR;  Service: Ophthalmology;  Laterality: Left;  sleep apnea   CATARACT EXTRACTION W/PHACO Right 08/03/2021   Procedure: CATARACT EXTRACTION PHACO AND INTRAOCULAR LENS PLACEMENT (IOC) RIGHT;  Surgeon: Jaye Fallow, MD;  Location: [facility] SURGERY CNTR;  Service: Ophthalmology;  Laterality: Right;  3.19 0:33.0   CHOLECYSTECTOMY     COLON RESECTION Left 12/26/2004   due to diverticular disease at [facility]   COLONOSCOPY  2013?   COLONOSCOPY N/A 11/30/2022   Procedure: COLONOSCOPY;  Surgeon: Unk Corinn Skiff, MD;  Location: [facility] ENDOSCOPY;  Service: Gastroenterology;  Laterality: N/A;   COLONOSCOPY WITH PROPOFOL  N/A 05/22/2018   Procedure: COLONOSCOPY WITH PROPOFOL ;  Surgeon: Unk Corinn Skiff, MD;  Location: [facility] ENDOSCOPY;  Service: Gastroenterology;  Laterality: N/A;   COLONOSCOPY WITH PROPOFOL  N/A 10/22/2020   Procedure: COLONOSCOPY WITH PROPOFOL ;  Surgeon: Unk Corinn Skiff, MD;  Location: [facility] SURGERY CNTR;  Service: Endoscopy;  Laterality: N/A;  priority 4   COLONOSCOPY WITH PROPOFOL  N/A 11/10/2021   Procedure: COLONOSCOPY WITH PROPOFOL ;  Surgeon: Unk Corinn Skiff, MD;  Location: [facility] ENDOSCOPY;  Service: Gastroenterology;  Laterality: N/A;   COLONOSCOPY WITH PROPOFOL  N/A 06/16/2022   Procedure: COLONOSCOPY WITH PROPOFOL ;  Surgeon: Unk Corinn Skiff, MD;  Location: [facility];  Service: Endoscopy;  Laterality: N/A;   COLONOSCOPY WITH PROPOFOL  N/A 11/29/2022   Procedure: COLONOSCOPY WITH  PROPOFOL ;  Surgeon: Unk Corinn Skiff, MD;  Location: [facility] ENDOSCOPY;  Service: Gastroenterology;  Laterality: N/A;   DRUG INDUCED ENDOSCOPY N/A 06/10/2019   Procedure: DRUG INDUCED SLEEP ENDOSCOPY;  Surgeon: Mable Lenis, MD;  Location: [facility];  Service: ENT;  Laterality: N/A;   ESOPHAGOGASTRODUODENOSCOPY (EGD) WITH PROPOFOL  N/A 06/16/2022   Procedure: ESOPHAGOGASTRODUODENOSCOPY (EGD) WITH PROPOFOL ;  Surgeon: Unk Corinn Skiff, MD;  Location: [facility] SURGERY CNTR;  Service: Endoscopy;  Laterality: N/A;   ESOPHAGOGASTRODUODENOSCOPY (EGD) WITH PROPOFOL  N/A 05/24/2023   Procedure: ESOPHAGOGASTRODUODENOSCOPY (EGD) WITH PROPOFOL ;  Surgeon: Unk Corinn Skiff, MD;  Location: [facility] ENDOSCOPY;  Service: Gastroenterology;  Laterality: N/A;   ESOPHAGOGASTRODUODENOSCOPY (EGD) WITH PROPOFOL  N/A 08/29/2023   Procedure: ESOPHAGOGASTRODUODENOSCOPY (EGD) WITH PROPOFOL ;  Surgeon: Unk Corinn Skiff, MD;  Location: [facility] ENDOSCOPY;  Service: Gastroenterology;  Laterality: N/A;   GASTRIC BYPASS N/A 12/27/2007  HARDWARE REMOVAL Right 06/22/2023   Procedure: HARDWARE REMOVAL;  Surgeon: Celena Sharper, MD;  Location: [facility] OR;  Service: Orthopedics;  Laterality: Right;   HARDWARE REMOVAL N/A 01/23/2024   Procedure: REMOVAL OF POSTERIOR SPINAL INSTRUMENTATION;  Surgeon: Georgina Sharper LABOR, MD;  Location: [facility] OR;  Service: Orthopedics;  Laterality: N/A;   HIATAL HERNIA REPAIR  09/25/2014   at [facility]   KNEE ARTHROSCOPY Right 06/22/2023   Procedure: ARTHROSCOPY KNEE;  Surgeon: Celena Sharper, MD;  Location: [facility] OR;  Service: Orthopedics;  Laterality: Right;   ORIF FEMUR FRACTURE Right 08/30/2022   Procedure: OPEN REDUCTION INTERNAL FIXATION (ORIF) DISTAL FEMUR FRACTURE;  Surgeon: Celena Sharper, MD;  [facility] OR;  Service: Orthopedics;  Laterality: Right;   POLYPECTOMY  10/22/2020   Procedure: POLYPECTOMY;  Surgeon: Unk Corinn Skiff, MD;  Location: [facility] SURGERY CNTR;  Service: Endoscopy;;    POLYPECTOMY  06/16/2022   Procedure: POLYPECTOMY;  Surgeon: Unk Corinn Skiff, MD;  Location: [facility] SURGERY CNTR;  Service: Endoscopy;;   ROTATOR CUFF REPAIR Right    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   TOTAL KNEE ARTHROPLASTY Right 09/19/2023   Procedure: RIGHT TOTAL KNEE ARTHROPLASTY;  Surgeon: Vernetta Lonni GRADE, MD;  Location: [facility] OR;  Service: Orthopedics;  Laterality: Right;   Past Surgical History:  Procedure Laterality Date   ABDOMINAL EXPOSURE N/A 03/06/2023   Procedure: ABDOMINAL EXPOSURE;  Surgeon: Gretta Lonni PARAS, MD;  Location: [facility] OR;  Service: Vascular;  Laterality: N/A;   ACHILLES TENDON REPAIR Right 12/26/2006   ANTERIOR LUMBAR FUSION N/A 03/06/2023   Procedure: L5-S1 ANTERIOR LUMBAR FUSION 1 LEVEL;  Surgeon: Georgina Sharper LABOR, MD;  [facility];  Service: Orthopedics;  Laterality: N/A;   BIOPSY  08/29/2023   Procedure: BIOPSY;  Surgeon: Unk Corinn Skiff, MD;  Location: [facility] ENDOSCOPY;  Service: Gastroenterology;;   CATARACT EXTRACTION W/PHACO Left 07/20/2021   Procedure: CATARACT EXTRACTION PHACO AND INTRAOCULAR LENS PLACEMENT (IOC) LEFT 2.11 00:24.0;  Surgeon: Jaye Fallow, MD;  Location: [facility] SURGERY CN[facility];  Service: Ophthalmology;  Laterality: Left;  sleep apnea   CATARACT EXTRACTION W/PHACO Right 08/03/2021   Procedure: CATARACT EXTRACTION PHACO AND INTRAOCULAR LENS PLACEMENT (IOC) RIGHT;  Surgeon: Jaye Fallow, MD;  Location: [facility] SURGERY CNTR;  Service: Ophthalmology;  Laterality: Right;  3.19 0:33.0   CHOLECYSTECTOMY     COLON RESECTION Left 12/26/2004   due to diverticular disease at [facility]   COLONOSCOPY  2013?   COLONOSCOPY N/A 11/30/2022   Procedure: COLONOSCOPY;  Surgeon: Unk Corinn Skiff, MD;  Location: [facility] ENDOSCOPY;  Service: Gastroenterology;  Laterality: N/A;   COLONOSCOPY WITH PROPOFOL  N/A 05/22/2018   Procedure: COLONOSCOPY WITH PROPOFOL ;  Surgeon: Unk Corinn Skiff, MD;  Location: [facility] ENDOSCOPY;  Service:  Gastroenterology;  Laterality: N/A;   COLONOSCOPY WITH PROPOFOL  N/A 10/22/2020   Procedure: COLONOSCOPY WITH PROPOFOL ;  Surgeon: Unk Corinn Skiff, MD;  Location: [facility] SURGERY CNTR;  Service: Endoscopy;  Laterality: N/A;  priority 4   COLONOSCOPY WITH PROPOFOL  N/A 11/10/2021   Procedure: COLONOSCOPY WITH PROPOFOL ;  Surgeon: Unk Corinn Skiff, MD;  Location: [facility] ENDOSCOPY;  Service: Gastroenterology;  Laterality: N/A;   COLONOSCOPY WITH PROPOFOL  N/A 06/16/2022   Procedure: COLONOSCOPY WITH PROPOFOL ;  Surgeon: Unk Corinn Skiff, MD;  Location: [facility] SURGERY CNTR;  Service: Endoscopy;  Laterality: N/A;   COLONOSCOPY WITH PROPOFOL  N/A 11/29/2022   Procedure: COLONOSCOPY WITH PROPOFOL ;  Surgeon: Unk Corinn Skiff, MD;  Location: [facility] ENDOSCOPY;  Service: Gastroenterology;  Laterality: N/A;   DRUG INDUCED ENDOSCOPY N/A 06/10/2019  Procedure: DRUG INDUCED SLEEP ENDOSCOPY;  Surgeon: Mable Lenis, MD;  Location: [facility];  Service: ENT;  Laterality: N/A;   ESOPHAGOGASTRODUODENOSCOPY (EGD) WITH PROPOFOL  N/A 06/16/2022   Procedure: ESOPHAGOGASTRODUODENOSCOPY (EGD) WITH PROPOFOL ;  Surgeon: Unk Corinn Skiff, MD;  Location: [facility] SURGERY CNTR;  Service: Endoscopy;  Laterality: N/A;   ESOPHAGOGASTRODUODENOSCOPY (EGD) WITH PROPOFOL  N/A 05/24/2023   Procedure: ESOPHAGOGASTRODUODENOSCOPY (EGD) WITH PROPOFOL ;  Surgeon: Unk Corinn Skiff, MD;  Location: [facility] ENDOSCOPY;  Service: Gastroenterology;  Laterality: N/A;   ESOPHAGOGASTRODUODENOSCOPY (EGD) WITH PROPOFOL  N/A 08/29/2023   Procedure: ESOPHAGOGASTRODUODENOSCOPY (EGD) WITH PROPOFOL ;  Surgeon: Unk Corinn Skiff, MD;  Location: [facility] ENDOSCOPY;  Service: Gastroenterology;  Laterality: N/A;   GASTRIC BYPASS N/A 12/27/2007   HARDWARE REMOVAL Right 06/22/2023   Procedure: HARDWARE REMOVAL;  Surgeon: Celena Sharper, MD;  Location: [facility] OR;  Service: Orthopedics;  Laterality: Right;   HARDWARE REMOVAL N/A 01/23/2024   Procedure:  REMOVAL OF POSTERIOR SPINAL INSTRUMENTATION;  Surgeon: Georgina Sharper LABOR, MD;  [facility];  Service: Orthopedics;  Laterality: N/A;   HIATAL HERNIA REPAIR  09/25/2014   at [facility]   KNEE ARTHROSCOPY Right 06/22/2023   Procedure: ARTHROSCOPY KNEE;  Surgeon: Celena Sharper, MD;  Location: [facility] OR;  Service: Orthopedics;  Laterality: Right;   ORIF FEMUR FRACTURE Right 08/30/2022   Procedure: OPEN REDUCTION INTERNAL FIXATION (ORIF) DISTAL FEMUR FRACTURE;  Surgeon: Celena Sharper, MD;  [facility];  Service: Orthopedics;  Laterality: Right;   POLYPECTOMY  10/22/2020   Procedure: POLYPECTOMY;  Surgeon: Unk Corinn Skiff, MD;  Location: [facility] SURGERY CNTR;  Service: Endoscopy;;   POLYPECTOMY  06/16/2022   Procedure: POLYPECTOMY;  Surgeon: Unk Corinn Skiff, MD;  Location: [facility] SURGERY CNTR;  Service: Endoscopy;;   ROTATOR CUFF REPAIR Right    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   TOTAL KNEE ARTHROPLASTY Right 09/19/2023   Procedure: RIGHT TOTAL KNEE ARTHROPLASTY;  Surgeon: Vernetta Lonni GRADE, MD;  Location: [facility] OR;  Service: Orthopedics;  Laterality: Right;   Past Medical History:  Diagnosis Date   Allergic rhinitis due to pollen    Anemia    Anxiety    Chronic pain    Colon polyps    Depression    Diverticulitis large intestine    Erosive esophagitis    GERD (gastroesophageal reflux disease)    HTN (hypertension)    Irritable bowel syndrome    Metabolic bone disease 09/01/2022   Obesity    Osteoarthritis, knee    Sleep apnea    does not use cpap, pt states he no longer needed CPAP due to weight loss   Sleep disturbance    Vitamin D  deficiency 09/01/2022   BP (!) 144/97   Pulse 70   Ht 5' 10 (1.778 m)   Wt 207 lb 6.4 oz (94.1 kg)   SpO2 96%   BMI 29.76 kg/m   Opioid Risk Score:   Fall Risk Score:  `1  Depression screen PHQ 2/9     09/20/2024    9:30 AM 07/30/2024    9:02 AM 07/12/2024    2:55 PM 05/23/2024    9:33 AM 04/11/2024   10:01 AM 02/02/2024     9:52 AM 01/05/2024    8:30 AM  Depression screen PHQ 2/9  Decreased Interest  1 3 1 1 3 2   Down, Depressed, Hopeless  1 3 1 1 3 3   PHQ - 2 Score  2 6 2 2 6 5   Altered sleeping 3 3 3  3 3  Tired, decreased energy 3 1 3   2 2   Change in appetite  0 0   1 0  Feeling bad or failure about yourself   0 0   1 1  Trouble concentrating  0 0   1 1  Moving slowly or fidgety/restless  0 0   0 2  Suicidal thoughts  0 0   0 0  PHQ-9 Score  6 12   14 14   Difficult doing work/chores  Extremely dIfficult Not difficult at all   Very difficult Very difficult     Review of Systems  Musculoskeletal:  Positive for back pain.       Right knee  Psychiatric/Behavioral:  Positive for dysphoric mood.   All other systems reviewed and are negative.      Objective:   Physical Exam Vitals and nursing note reviewed.  Constitutional:      Appearance: Normal appearance.  Cardiovascular:     Rate and Rhythm: Normal rate and regular rhythm.     Pulses: Normal pulses.     Heart sounds: Normal heart sounds.  Pulmonary:     Effort: Pulmonary effort is normal.     Breath sounds: Normal breath sounds.  Musculoskeletal:     Comments: Normal Muscle Bulk and Muscle Testing Reveals:  Upper Extremities: Full ROM and Muscle Strength 5/5 Thoracic Paraspinal Tenderness: T-4-T-6 Lumbar Paraspinal Tenderness: L-3-L-5 Lower Extremities: Full ROM and Muscle Strength 5/5 Arises from Table with Ease  Narrow Based  Gait     Skin:    General: Skin is warm and dry.  Neurological:     Mental Status: He is alert and oriented to person, place, and time.  Psychiatric:        Mood and Affect: Mood normal.        Behavior: Behavior normal.          Assessment & Plan:  Chronic Bilateral low back pain without Sciatica: Continue HEP as tolerated. Continue to Monitor. 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 09/20/2024 3. Insomnia: Continue Trazodone . Continue to  Monitor. 09/20/2024 4. Fall at Home subsequent encounter: No Falls this month. Educated on [employer], he verbalizes understanding. 09/20/2024 5. Chronic Pain Syndrome: Refilled: Xtampza  18 mg one capsule every 12 hours #60 and Oxycodone  7.5/325 mg one tablet every 6 hours as needed for #120. We will continue the opioid monitoring program, this consists of regular clinic visits, examinations, urine drug screen, pill counts as well as use of [place]  Controlled Substance Reporting system. A 12 month History has been reviewed on the [place]  Controlled Substance Reporting System on 09/20/2024   F/U in 1 month

## 2024-09-20 NOTE — Progress Notes (Signed)
   09/25/2024 8:29 AM   Harold Smith Jul 23, 1966 [id number]  Reason for visit: Follow up ED   HPI: 58 year old male here for initial follow-up with me, previously seen by Dr. Penne Followed for ED -previously on 20mg  tadalafil , has not used in several months. Dose was effective, no prior SEs  PCP recently started low T treatment- patient on IM cypionate x2-3 months by his report  Hx of priapism, also current Trazadone use   Prior HPI: Last seen in April 2024, Dr. Jerrilyn for ED Also, history of a scrotal abscess History of prior priapism  Physical Exam: BP (!) 143/87   Pulse 74   Ht 5' 11 (1.803 m)   Wt 195 lb (88.5 kg)   BMI 27.20 kg/m    Constitutional:  Alert and oriented, No acute distress.   Laboratory Data: N/A  Pertinent Imaging: N/A   Assessment & Plan:    Vasculogenic erectile dysfunction, unspecified vasculogenic erectile dysfunction type Assessment & Plan: Chronic ED On tadalafil  20mg  PRN  Hx of priapism, on trazadone  New Rx for sildenafil  100mg  PRN sent today   - 20mg  tadalafil  not covered by insurance. He could also shop via compounding pharmacies or pay-out of pocket through online retailer Patient may consolidate care through PCP for refills  I am happy to see him again if worsening ED, or new clinical changes     Other orders -     Sildenafil  Citrate; Take 1 tablet (100 mg total) by mouth daily as needed for erectile dysfunction.  Dispense: 12 tablet; Refill: 3       Penne Harold Skye, MD  [facility] [address] [place], [place] [address] [phone]

## 2024-09-25 ENCOUNTER — Ambulatory Visit (INDEPENDENT_AMBULATORY_CARE_PROVIDER_SITE_OTHER): Admitting: Urology

## 2024-09-25 VITALS — BP 143/87 | HR 74 | Ht 71.0 in | Wt 195.0 lb

## 2024-09-25 DIAGNOSIS — N529 Male erectile dysfunction, unspecified: Secondary | ICD-10-CM

## 2024-09-25 MED ORDER — SILDENAFIL CITRATE 100 MG PO TABS: 100.0000 mg | ORAL_TABLET | Freq: Every day | ORAL | 3 refills | Status: DC | PRN

## 2024-10-10 ENCOUNTER — Encounter: Attending: Registered Nurse | Admitting: Physical Medicine & Rehabilitation

## 2024-10-10 ENCOUNTER — Encounter: Payer: Self-pay | Admitting: Physical Medicine & Rehabilitation

## 2024-10-10 VITALS — BP 119/80 | HR 70 | Ht 71.0 in | Wt 202.0 lb

## 2024-10-10 DIAGNOSIS — M25569 Pain in unspecified knee: Secondary | ICD-10-CM | POA: Insufficient documentation

## 2024-10-10 DIAGNOSIS — M545 Low back pain, unspecified: Secondary | ICD-10-CM | POA: Diagnosis present

## 2024-10-10 DIAGNOSIS — G894 Chronic pain syndrome: Secondary | ICD-10-CM | POA: Insufficient documentation

## 2024-10-10 DIAGNOSIS — Z79899 Other long term (current) drug therapy: Secondary | ICD-10-CM | POA: Diagnosis not present

## 2024-10-10 DIAGNOSIS — G8929 Other chronic pain: Secondary | ICD-10-CM | POA: Insufficient documentation

## 2024-10-10 DIAGNOSIS — Z96659 Presence of unspecified artificial knee joint: Secondary | ICD-10-CM | POA: Diagnosis present

## 2024-10-10 MED ORDER — OXYCODONE-ACETAMINOPHEN 7.5-325 MG PO TABS: 1.0000 | ORAL_TABLET | Freq: Four times a day (QID) | ORAL | 0 refills | Status: AC | PRN

## 2024-10-10 MED ORDER — XTAMPZA ER 18 MG PO C12A: 1.0000 | EXTENDED_RELEASE_CAPSULE | Freq: Two times a day (BID) | ORAL | 0 refills | Status: AC
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
## 2024-10-16 ENCOUNTER — Encounter: Payer: Self-pay | Admitting: Physical Medicine & Rehabilitation

## 2024-10-16 MED ORDER — XTAMPZA ER 18 MG PO C12A: 1.0000 | EXTENDED_RELEASE_CAPSULE | Freq: Two times a day (BID) | ORAL | 0 refills | Status: DC

## 2024-10-16 MED ORDER — OXYCODONE-ACETAMINOPHEN 7.5-325 MG PO TABS: 1.0000 | ORAL_TABLET | Freq: Four times a day (QID) | ORAL | 0 refills | Status: DC | PRN

## 2024-10-28 ENCOUNTER — Encounter: Payer: Self-pay | Admitting: Radiology

## 2024-11-01 ENCOUNTER — Encounter: Payer: Self-pay | Admitting: Physical Medicine & Rehabilitation

## 2024-11-06 ENCOUNTER — Other Ambulatory Visit: Payer: Self-pay

## 2024-11-06 ENCOUNTER — Telehealth: Payer: Self-pay

## 2024-11-06 MED ORDER — FERROUS SULFATE 325 (65 FE) MG PO TABS: ORAL_TABLET | ORAL | 2 refills | Status: DC

## 2024-11-06 NOTE — Telephone Encounter (Signed)
 Opened in error

## 2024-11-06 NOTE — Telephone Encounter (Signed)
 Pt needs TOC with whomever he was going to switch to. Thanks.

## 2024-11-14 ENCOUNTER — Encounter: Attending: Registered Nurse | Admitting: Physical Medicine & Rehabilitation

## 2024-11-14 ENCOUNTER — Encounter: Payer: Self-pay | Admitting: Physical Medicine & Rehabilitation

## 2024-11-14 VITALS — BP 149/100 | HR 75 | Ht 71.0 in | Wt 211.0 lb

## 2024-11-14 DIAGNOSIS — G8929 Other chronic pain: Secondary | ICD-10-CM | POA: Insufficient documentation

## 2024-11-14 DIAGNOSIS — M25569 Pain in unspecified knee: Secondary | ICD-10-CM | POA: Insufficient documentation

## 2024-11-14 DIAGNOSIS — M545 Low back pain, unspecified: Secondary | ICD-10-CM | POA: Insufficient documentation

## 2024-11-14 DIAGNOSIS — Z96659 Presence of unspecified artificial knee joint: Secondary | ICD-10-CM | POA: Insufficient documentation

## 2024-11-14 DIAGNOSIS — G894 Chronic pain syndrome: Secondary | ICD-10-CM | POA: Diagnosis present

## 2024-11-14 DIAGNOSIS — M25551 Pain in right hip: Secondary | ICD-10-CM | POA: Diagnosis not present

## 2024-11-14 MED ORDER — XTAMPZA ER 18 MG PO C12A: 1.0000 | EXTENDED_RELEASE_CAPSULE | Freq: Two times a day (BID) | ORAL | 0 refills | Status: AC

## 2024-11-14 MED ORDER — OXYCODONE-ACETAMINOPHEN 7.5-325 MG PO TABS: 1.0000 | ORAL_TABLET | Freq: Four times a day (QID) | ORAL | 0 refills | Status: AC | PRN

## 2024-11-14 NOTE — Progress Notes (Signed)
 Subjective:    Patient ID: Harold Smith, male    DOB: 1966-01-07, 58 y.o.   MRN: [id number]  HPI  Pain Inventory Average Pain 5 Pain Right Now 6 My pain is sharp, burning, dull, stabbing, tingling, and aching  In the last 24 hours, has pain interfered with the following? General activity 9 Relation with others 8 Enjoyment of life 8 What TIME of day is your pain at its worst? morning , daytime, evening, and night Sleep (in general) Poor  Pain is worse with: walking, bending, sitting, inactivity, and some activites Pain improves with: rest and medication Relief from Meds: 8  Family History  Problem Relation Age of Onset  . Hypertension Mother   . Diabetes Mother   . Kidney disease Mother   . Heart disease Mother   . Colon cancer Father 84  . Asthma Sister   . Obesity Sister   . Stroke Sister   . Obesity Brother   . Diabetes Other   . Heart disease Other   . Hypertension Other   . Kidney disease Other   . Cancer Paternal Uncle        unk type, possible prostate  . Lung cancer Paternal Grandmother    Social History   Socioeconomic History  . Marital status: Single    Spouse name: Not on file  . Number of children: 3  . Years of education: Not on file  . Highest education level: Some college, no degree  Occupational History  . Occupation: Hydrographic surveyor    End: 89/68/7977  Tobacco Use  . Smoking status: Former    Types: Cigars    Passive exposure: Past  . Smokeless tobacco: Never  . Tobacco comments:    occasional cigar  Vaping Use  . Vaping status: Never Used  Substance and Sexual Activity  . Alcohol use: Yes    Comment: Occasionally  . Drug use: No  . Sexual activity: Yes  Other Topics Concern  . Not on file  Social History Narrative   Jehovah's witness--no blood products   No formal living will   Daughter Dela should be health care POA   Would accept resuscitation   Would accept feeding tube---and then leave it up to family    Social Drivers of Health   Financial Resource Strain: Medium Risk (07/14/2024)   Overall Financial Resource Strain (CARDIA)   . Difficulty of Paying Living Expenses: Somewhat hard  Food Insecurity: Food Insecurity Present (07/14/2024)   Hunger Vital Sign   . Worried About Programme Researcher, Broadcasting/film/video in the Last Year: Sometimes true   . Ran Out of Food in the Last Year: Sometimes true  Transportation Needs: No Transportation Needs (07/14/2024)   PRAPARE - Transportation   . Lack of Transportation (Medical): No   . Lack of Transportation (Non-Medical): No  Physical Activity: Inactive (07/14/2024)   Exercise Vital Sign   . Days of Exercise per Week: 0 days   . Minutes of Exercise per Session: Not on file  Stress: Stress Concern Present (07/14/2024)   [employer] of Occupational Health - Occupational Stress Questionnaire   . Feeling of Stress: Very much  Social Connections: Socially Isolated (07/14/2024)   Social Connection and Isolation Panel   . Frequency of Communication with Friends and Family: More than three times a week   . Frequency of Social Gatherings with Friends and Family: Once a week   . Attends Religious Services: Never   . Active Member of Clubs  or Organizations: No   . Attends Banker Meetings: Not on file   . Marital Status: Divorced   Past Surgical History:  Procedure Laterality Date  . ABDOMINAL EXPOSURE N/A 03/06/2023   Procedure: ABDOMINAL EXPOSURE;  Surgeon: Gretta Lonni PARAS, MD;  Location: [facility] OR;  Service: Vascular;  Laterality: N/A;  . ACHILLES TENDON REPAIR Right 12/26/2006  . ANTERIOR LUMBAR FUSION N/A 03/06/2023   Procedure: L5-S1 ANTERIOR LUMBAR FUSION 1 LEVEL;  Surgeon: Georgina Ozell LABOR, MD;  Location: [facility] OR;  Service: Orthopedics;  Laterality: N/A;  . BIOPSY  08/29/2023   Procedure: BIOPSY;  Surgeon: Unk Corinn Skiff, MD;  Location: [facility] ENDOSCOPY;  Service: Gastroenterology;;  . CATARACT EXTRACTION W/PHACO Left 07/20/2021   Procedure:  CATARACT EXTRACTION PHACO AND INTRAOCULAR LENS PLACEMENT (IOC) LEFT 2.11 00:24.0;  Surgeon: Jaye Fallow, MD;  Location: [facility];  Service: Ophthalmology;  Laterality: Left;  sleep apnea  . CATARACT EXTRACTION W/PHACO Right 08/03/2021   Procedure: CATARACT EXTRACTION PHACO AND INTRAOCULAR LENS PLACEMENT (IOC) RIGHT;  Surgeon: Jaye Fallow, MD;  Location: [facility];  Service: Ophthalmology;  Laterality: Right;  3.19 0:33.0  . CHOLECYSTECTOMY    . COLON RESECTION Left 12/26/2004   due to diverticular disease at [facility]  . COLONOSCOPY  2013?  . COLONOSCOPY N/A 11/30/2022   Procedure: COLONOSCOPY;  Surgeon: Unk Corinn Skiff, MD;  Location: [facility] ENDOSCOPY;  Service: Gastroenterology;  Laterality: N/A;  . COLONOSCOPY WITH PROPOFOL  N/A 05/22/2018   Procedure: COLONOSCOPY WITH PROPOFOL ;  Surgeon: Unk Corinn Skiff, MD;  Location: [facility] ENDOSCOPY;  Service: Gastroenterology;  Laterality: N/A;  . COLONOSCOPY WITH PROPOFOL  N/A 10/22/2020   Procedure: COLONOSCOPY WITH PROPOFOL ;  Surgeon: Unk Corinn Skiff, MD;  Location: [facility];  Service: Endoscopy;  Laterality: N/A;  priority 4  . COLONOSCOPY WITH PROPOFOL  N/A 11/10/2021   Procedure: COLONOSCOPY WITH PROPOFOL ;  Surgeon: Unk Corinn Skiff, MD;  Location: [facility] ENDOSCOPY;  Service: Gastroenterology;  Laterality: N/A;  . COLONOSCOPY WITH PROPOFOL  N/A 06/16/2022   Procedure: COLONOSCOPY WITH PROPOFOL ;  Surgeon: Unk Corinn Skiff, MD;  Location: [facility];  Service: Endoscopy;  Laterality: N/A;  . COLONOSCOPY WITH PROPOFOL  N/A 11/29/2022   Procedure: COLONOSCOPY WITH PROPOFOL ;  Surgeon: Unk Corinn Skiff, MD;  Location: [facility] ENDOSCOPY;  Service: Gastroenterology;  Laterality: N/A;  . DRUG INDUCED ENDOSCOPY N/A 06/10/2019   Procedure: DRUG INDUCED SLEEP ENDOSCOPY;  Surgeon: Mable Lenis, MD;  Location: [facility];  Service: ENT;  Laterality: N/A;  . ESOPHAGOGASTRODUODENOSCOPY  (EGD) WITH PROPOFOL  N/A 06/16/2022   Procedure: ESOPHAGOGASTRODUODENOSCOPY (EGD) WITH PROPOFOL ;  Surgeon: Unk Corinn Skiff, MD;  Location: [facility] SUR[facility]ERY [facility];  Service: Endoscopy;  Laterality: N/A;  . ESOPHAGOGASTRODUODENOSCOPY (EGD) WITH PROPOFOL  N/A 05/24/2023   Procedure: ESOPHAGOGASTRODUODENOSCOPY (EGD) WITH PROPOFOL ;  Surgeon: Unk Corinn Skiff, MD;  Location: [facility] ENDOSCOPY;  Service: Gastroenterology;  Laterality: N/A;  . ESOPHAGOGASTRODUODENOSCOPY (EGD) WITH PROPOFOL  N/A 08/29/2023   Procedure: ESOPHAGOGASTRODUODENOSCOPY (EGD) WITH PROPOFOL ;  Surgeon: Unk Corinn Skiff, MD;  Location: [facility] ENDOSCOPY;  Service: Gastroenterology;  Laterality: N/A;  . GASTRIC BYPASS N/A 12/27/2007  . HARDWARE REMOVAL Right 06/22/2023   Procedure: HARDWARE REMOVAL;  Surgeon: Celena Ozell, MD;  Location: [facility] OR;  Service: Orthopedics;  Laterality: Right;  . HARDWARE REMOVAL N/A 01/23/2024   Procedure: REMOVAL OF POSTERIOR SPINAL INSTRUMENTATION;  Surgeon: Georgina Ozell LABOR, MD;  Location: [facility] OR;  Service: Orthopedics;  Laterality: N/A;  . HIATAL HERNIA REPAIR  09/25/2014   at [facility]  . KNEE ARTHROSCOPY Right 06/22/2023  Procedure: ARTHROSCOPY KNEE;  Surgeon: Celena Sharper, MD;  Location: [facility] OR;  Service: Orthopedics;  Laterality: Right;  . ORIF FEMUR FRACTURE Right 08/30/2022   Procedure: OPEN REDUCTION INTERNAL FIXATION (ORIF) DISTAL FEMUR FRACTURE;  Surgeon: Celena Sharper, MD;  Location[facility] OR;  Service: Orthopedics;  Laterality: Right;  . POLYPECTOMY  10/22/2020   Procedure: POLYPECTOMY;  Surgeon: Unk Corinn Skiff, MD;  Location: [facility];  Service: Endoscopy;;  . POLYPECTOMY  06/16/2022   Procedure: POLYPECTOMY;  Surgeon: Unk Corinn Skiff, MD;  Location: [facility];  Service: Endoscopy;;  . ROTATOR CUFF REPAIR Right   . 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 TOTAL KNEE ARTHROPLASTY Right 09/19/2023   Procedure: RIGHT TOTAL KNEE ARTHROPLASTY;  Surgeon:  Vernetta Lonni GRADE, MD;  Location: [facility] OR;  Service: Orthopedics;  Laterality: Right;   Past Surgical History:  Procedure Laterality Date  . ABDOMINAL EXPOSURE N/A 03/06/2023   Procedure: ABDOMINAL EXPOSURE;  Surgeon: Gretta Lonni PARAS, MD;  Location: [facility] OR;  Service: Vascular;  Laterality: N/A;  . ACHILLES TENDON REPAIR Right 12/26/2006  . ANTERIOR LUMBAR FUSION N/A 03/06/2023   Procedure: L5-S1 ANTERIOR LUMBAR FUSION 1 LEVEL;  Surgeon: Georgina Sharper LABOR, MD;  [facility];  Service: Orthopedics;  Laterality: N/A;  . BIOPSY  08/29/2023   Procedure: BIOPSY;  Surgeon: Unk Corinn Skiff, MD;  Location: [facility] ENDOSCOPY;  Service: Gastroenterology;;  . CATARACT EXTRACTION W/PHACO Left 07/20/2021   Procedure: CATARACT EXTRACTION PHACO AND INTRAOCULAR LENS PLACEMENT (IOC) LEFT 2.11 00:24.0;  Surgeon: Jaye Fallow, MD;  Location: [facility];  Service: Ophthalmology;  Laterality: Left;  sleep apnea  . CATARACT EXTRACTION W/PHACO Right 08/03/2021   Procedure: CATARACT EXTRACTION PHACO AND INTRAOCULAR LENS PLACEMENT (IOC) RIGHT;  Surgeon: Jaye Fallow, MD;  Location: [facility];  Service: Ophthalmology;  Laterality: Right;  3.19 0:33.0  . CHOLECYSTECTOMY    . COLON RESECTION Left 12/26/2004   due to diverticular disease at [facility]  . COLONOSCOPY  2013?  . COLONOSCOPY N/A 11/30/2022   Procedure: COLONOSCOPY;  Surgeon: Unk Corinn Skiff, MD;  Location: [facility] ENDOSCOPY;  Service: Gastroenterology;  Laterality: N/A;  . COLONOSCOPY WITH PROPOFOL  N/A 05/22/2018   Procedure: COLONOSCOPY WITH PROPOFOL ;  Surgeon: Unk Corinn Skiff, MD;  Location: [facility] ENDOSCOPY;  Service: Gastroenterology;  Laterality: N/A;  . COLONOSCOPY WITH PROPOFOL  N/A 10/22/2020   Procedure: COLONOSCOPY WITH PROPOFOL ;  Surgeon: Unk Corinn Skiff, MD;  Location: [facility];  Service: Endoscopy;  Laterality: N/A;  priority 4  . COLONOSCOPY WITH PROPOFOL  N/A 11/10/2021   Procedure:  COLONOSCOPY WITH PROPOFOL ;  Surgeon: Unk Corinn Skiff, MD;  Location: [facility] ENDOSCOPY;  Service: Gastroenterology;  Laterality: N/A;  . COLONOSCOPY WITH PROPOFOL  N/A 06/16/2022   Procedure: COLONOSCOPY WITH PROPOFOL ;  Surgeon: Unk Corinn Skiff, MD;  Location: [facility];  Service: Endoscopy;  Laterality: N/A;  . COLONOSCOPY WITH PROPOFOL  N/A 11/29/2022   Procedure: COLONOSCOPY WITH PROPOFOL ;  Surgeon: Unk Corinn Skiff, MD;  Location: [facility] ENDOSCOPY;  Service: Gastroenterology;  Laterality: N/A;  . DRUG INDUCED ENDOSCOPY N/A 06/10/2019   Procedure: DRUG INDUCED SLEEP ENDOSCOPY;  Surgeon: Mable Lenis, MD;  Location: [facility];  Service: ENT;  Laterality: N/A;  . ESOPHAGOGASTRODUODENOSCOPY (EGD) WITH PROPOFOL  N/A 06/16/2022   Procedure: ESOPHAGOGASTRODUODENOSCOPY (EGD) WITH PROPOFOL ;  Surgeon: Unk Corinn Skiff, MD;  Location: [facility]ERY [facility];  Service: Endoscopy;  Laterality: N/A;  . ESOPHAGOGASTRODUODENOSCOPY (EGD) WITH PROPOFOL  N/A 05/24/2023   Procedure: ESOPHAGOGASTRODUODENOSCOPY (EGD) WITH PROPOFOL ;  Surgeon:  Unk Corinn Skiff, MD;  Location: [facility] ENDOSCOPY;  Service: Gastroenterology;  Laterality: N/A;  . ESOPHAGOGASTRODUODENOSCOPY (EGD) WITH PROPOFOL  N/A 08/29/2023   Procedure: ESOPHAGOGASTRODUODENOSCOPY (EGD) WITH PROPOFOL ;  Surgeon: Unk Corinn Skiff, MD;  Location: [facility] ENDOSCOPY;  Service: Gastroenterology;  Laterality: N/A;  . GASTRIC BYPASS N/A 12/27/2007  . HARDWARE REMOVAL Right 06/22/2023   Procedure: HARDWARE REMOVAL;  Surgeon: Celena Sharper, MD;  Location: [facility] OR;  Service: Orthopedics;  Laterality: Right;  . HARDWARE REMOVAL N/A 01/23/2024   Procedure: REMOVAL OF POSTERIOR SPINAL INSTRUMENTATION;  Surgeon: Georgina Sharper LABOR, MD;  Location[facility];  Service: Orthopedics;  Laterality: N/A;  . HIATAL HERNIA REPAIR  09/25/2014   at [facility]  . KNEE ARTHROSCOPY Right 06/22/2023   Procedure: ARTHROSCOPY KNEE;  Surgeon: Celena Sharper, MD;   Location: [facility] OR;  Service: Orthopedics;  Laterality: Right;  . ORIF FEMUR FRACTURE Right 08/30/2022   Procedure: OPEN REDUCTION INTERNAL FIXATION (ORIF) DISTAL FEMUR FRACTURE;  Surgeon: Celena Sharper, MD;  [facility];  Service: Orthopedics;  Laterality: Right;  . POLYPECTOMY  10/22/2020   Procedure: POLYPECTOMY;  Surgeon: Unk Corinn Skiff, MD;  Location: [facility];  Service: Endoscopy;;  . POLYPECTOMY  06/16/2022   Procedure: POLYPECTOMY;  Surgeon: Unk Corinn Skiff, MD;  Location: [facility];  Service: Endoscopy;;  . ROTATOR CUFF REPAIR Right   . 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 TOTAL KNEE ARTHROPLASTY Right 09/19/2023   Procedure: RIGHT TOTAL KNEE ARTHROPLASTY;  Surgeon: Vernetta Lonni GRADE, MD;  Location: [facility] OR;  Service: Orthopedics;  Laterality: Right;   Past Medical History:  Diagnosis Date  . Allergic rhinitis due to pollen   . Anemia   . Anxiety   . Chronic pain   . Colon polyps   . Depression   . Diverticulitis large intestine   . Erosive esophagitis   . GERD (gastroesophageal reflux disease)   . HTN (hypertension)   . Irritable bowel syndrome   . Metabolic bone disease 09/01/2022  . Obesity   . Osteoarthritis, knee   . Sleep apnea    does not use cpap, pt states he no longer needed CPAP due to weight loss  . Sleep disturbance   . Vitamin D  deficiency 09/01/2022   Ht 5' 11 (1.803 m)   Wt 211 lb (95.7 kg)   BMI 29.43 kg/m   Opioid Risk Score:   Fall Risk Score:  `1  Depression screen [facility] 2/9     10/10/2024   11:11 AM 09/20/2024    9:30 AM 07/30/2024    9:02 AM 07/12/2024    2:55 PM 05/23/2024    9:33 AM 04/11/2024   10:01 AM 02/02/2024    9:52 AM  Depression screen PHQ 2/9  Decreased Interest 1  1 3 1 1 3   Down, Depressed, Hopeless 1  1 3 1 1 3   PHQ - 2 Score 2  2 6 2 2 6   Altered sleeping  3 3 3   3   Tired, decreased energy  3 1 3   2   Change in appetite   0 0   1  Feeling bad or failure about yourself     0 0   1  Trouble concentrating   0 0   1  Moving slowly or fidgety/restless   0 0   0  Suicidal thoughts   0 0   0  PHQ-9 Score   6  12    14    Difficult doing  work/chores   Extremely dIfficult Not difficult at all   Very difficult     Data saved with a previous flowsheet row definition     Review of Systems  Musculoskeletal:  Positive for back pain.       Right hip pain Right knee pain  All other systems reviewed and are negative.      Objective:   Physical Exam        Assessment & Plan:
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
## 2024-11-22 ENCOUNTER — Encounter: Payer: Self-pay | Admitting: Pharmacist

## 2024-11-22 NOTE — Progress Notes (Signed)
 Pharmacy Quality Measure Review  This patient is appearing on a report for being at risk of failing the adherence measure for hypertension (ACEi/ARB) medications this calendar year.   Medication: lisinopril  20 mg Last fill date: 10/27 for 30 day supply  Insurance report was not up to date. No action needed at this time.  Medication has been refilled as of 11/20/24 x30 ds. 100% fill hx. No further action needed.    Pharmacy Quality Measure Review  This patient is appearing on a report for being at risk of failing the Controlling Blood Pressure measure this calendar year.   Last documented BP 149/100 on 11/14/24 BP Readings from Last 1 Encounters:  11/14/24 (!) 149/100   does not meet criteria for measure closure (BP <140/90).  No future visits scheduled in 2025.  TOC visit needed, patient has been contacted by the office.

## 2024-11-27 ENCOUNTER — Telehealth: Payer: Self-pay

## 2024-11-27 NOTE — Telephone Encounter (Signed)
 Copied from CRM [id number]. Topic: Appointments - Transfer of Care >> Nov 27, 2024  4:16 PM Tysheama G wrote: Pt is requesting to transfer FROM: Dr.[name] Pt is requesting to transfer TO: NP Vincente Shivers Reason for requested transfer: Dr.[name] retired. It is the responsibility of the team the patient would like to transfer to (Dr. Shivers Vincente) to reach out to the patient if for any reason this transfer is not acceptable.

## 2024-11-28 NOTE — Telephone Encounter (Signed)
 Spoke with patient and ronald him of below. Patient does not wish to transfer to [facility] if he has to be referred to urology. Patient states he's been getting his care done here and does not want to have to go to another doctors office for this. Patient requests I send a message asking Dr. Bennett if she is willing to take patient as TOC and continue prescribing his testosterone  to him? Advised patient I would send over the message to Dr. Bennett and that Clotilda would be reaching back out to the patient with her response and schedule his new TOC with her if approved. Patient cancelled TOC with [place].

## 2024-11-29 NOTE — Telephone Encounter (Signed)
 Ok for [facility] per pt request

## 2024-12-26 ENCOUNTER — Encounter: Payer: Self-pay | Admitting: Oncology

## 2024-12-27 ENCOUNTER — Encounter: Payer: Self-pay | Admitting: Oncology

## 2024-12-30 ENCOUNTER — Encounter

## 2024-12-30 ENCOUNTER — Encounter: Payer: Self-pay | Admitting: Oncology

## 2024-12-30 ENCOUNTER — Telehealth: Payer: Self-pay

## 2024-12-30 ENCOUNTER — Other Ambulatory Visit (HOSPITAL_COMMUNITY): Payer: Self-pay

## 2024-12-30 VITALS — BP 90/60 | HR 64 | Temp 99.0°F | Ht 71.0 in | Wt 210.0 lb

## 2024-12-30 DIAGNOSIS — F339 Major depressive disorder, recurrent, unspecified: Secondary | ICD-10-CM | POA: Insufficient documentation

## 2024-12-30 DIAGNOSIS — Z131 Encounter for screening for diabetes mellitus: Secondary | ICD-10-CM | POA: Diagnosis not present

## 2024-12-30 DIAGNOSIS — G4709 Other insomnia: Secondary | ICD-10-CM | POA: Diagnosis not present

## 2024-12-30 DIAGNOSIS — Z136 Encounter for screening for cardiovascular disorders: Secondary | ICD-10-CM

## 2024-12-30 DIAGNOSIS — I10 Essential (primary) hypertension: Secondary | ICD-10-CM | POA: Diagnosis not present

## 2024-12-30 DIAGNOSIS — F419 Anxiety disorder, unspecified: Secondary | ICD-10-CM | POA: Diagnosis not present

## 2024-12-30 DIAGNOSIS — Z9884 Bariatric surgery status: Secondary | ICD-10-CM

## 2024-12-30 DIAGNOSIS — E291 Testicular hypofunction: Secondary | ICD-10-CM

## 2024-12-30 MED ORDER — FERROUS SULFATE 325 (65 FE) MG PO TABS: ORAL_TABLET | ORAL | 3 refills | Status: AC

## 2024-12-30 MED ORDER — CETIRIZINE HCL 10 MG PO TABS: 10.0000 mg | ORAL_TABLET | Freq: Every day | ORAL | 3 refills | Status: AC

## 2024-12-30 MED ORDER — FLUTICASONE PROPIONATE 50 MCG/ACT NA SUSP: 2.0000 | Freq: Every day | NASAL | 3 refills | Status: AC | PRN

## 2024-12-30 MED ORDER — LISINOPRIL 20 MG PO TABS: 20.0000 mg | ORAL_TABLET | Freq: Every day | ORAL | 3 refills | Status: AC

## 2024-12-30 MED ORDER — SILDENAFIL CITRATE 100 MG PO TABS: 100.0000 mg | ORAL_TABLET | Freq: Every day | ORAL | 3 refills | Status: AC | PRN

## 2024-12-30 MED ORDER — DULOXETINE HCL 60 MG PO CPEP: 120.0000 mg | ORAL_CAPSULE | Freq: Every day | ORAL | 3 refills | Status: AC

## 2024-12-30 MED ORDER — PANCRELIPASE (LIP-PROT-AMYL) 36000-114000 UNITS PO CPEP: ORAL_CAPSULE | ORAL | 3 refills | Status: DC

## 2024-12-30 MED ORDER — HYDROCHLOROTHIAZIDE 25 MG PO TABS: 25.0000 mg | ORAL_TABLET | Freq: Every day | ORAL | 3 refills | Status: AC

## 2024-12-30 MED ORDER — DOXEPIN HCL 3 MG PO TABS: 3.0000 mg | ORAL_TABLET | Freq: Every day | ORAL | 0 refills | Status: AC

## 2024-12-30 MED ORDER — BUPROPION HCL ER (XL) 150 MG PO TB24: 150.0000 mg | ORAL_TABLET | Freq: Every day | ORAL | 0 refills | Status: AC

## 2024-12-30 MED ORDER — TESTOSTERONE CYPIONATE 200 MG/ML IM SOLN: 200.0000 mg | INTRAMUSCULAR | 3 refills | Status: AC

## 2024-12-30 NOTE — Patient Instructions (Addendum)
 Thank you for visiting [facility] today! Here's what we talked about: - START Doxepin  - START Wellbutrin  - STOP Trazodone  - STOP Gabapentin : 3 tablets at night only for 3 days, then 1 tablet nightly for 3 days then

## 2024-12-30 NOTE — Telephone Encounter (Signed)
 Pharmacy Patient Advocate Encounter   Received notification from [facility] KEY that prior authorization for Testosterone  Cypionate 200 is required/requested.   Insurance verification completed.   The patient is insured through [place].   Per test claim: PA required; PA submitted to above mentioned insurance via Latent Key/confirmation #/[id number] Status is pending
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
## 2024-12-31 ENCOUNTER — Telehealth: Payer: Self-pay

## 2024-12-31 ENCOUNTER — Inpatient Hospital Stay: Attending: Oncology

## 2024-12-31 ENCOUNTER — Other Ambulatory Visit (HOSPITAL_COMMUNITY): Payer: Self-pay

## 2024-12-31 DIAGNOSIS — Z9884 Bariatric surgery status: Secondary | ICD-10-CM | POA: Diagnosis not present

## 2024-12-31 DIAGNOSIS — D509 Iron deficiency anemia, unspecified: Secondary | ICD-10-CM | POA: Insufficient documentation

## 2024-12-31 DIAGNOSIS — D508 Other iron deficiency anemias: Secondary | ICD-10-CM

## 2024-12-31 LAB — IRON AND TIBC
Iron: 63 ug/dL (ref 45–182)
Saturation Ratios: 14 % — ABNORMAL LOW (ref 17.9–39.5)
TIBC: 468 ug/dL — ABNORMAL HIGH (ref 250–450)
UIBC: 404 ug/dL

## 2024-12-31 LAB — CBC WITH DIFFERENTIAL (CANCER CENTER ONLY)
Abs Immature Granulocytes: 0.05 K/uL (ref 0.00–0.07)
Basophils Absolute: 0.1 K/uL (ref 0.0–0.1)
Basophils Relative: 1 %
Eosinophils Absolute: 0.1 K/uL (ref 0.0–0.5)
Eosinophils Relative: 1 %
HCT: 44.3 % (ref 39.0–52.0)
Hemoglobin: 14.6 g/dL (ref 13.0–17.0)
Immature Granulocytes: 1 %
Lymphocytes Relative: 16 %
Lymphs Abs: 1.4 K/uL (ref 0.7–4.0)
MCH: 30.2 pg (ref 26.0–34.0)
MCHC: 33 g/dL (ref 30.0–36.0)
MCV: 91.5 fL (ref 80.0–100.0)
Monocytes Absolute: 0.7 K/uL (ref 0.1–1.0)
Monocytes Relative: 9 %
Neutro Abs: 6.1 K/uL (ref 1.7–7.7)
Neutrophils Relative %: 72 %
Platelet Count: 374 K/uL (ref 150–400)
RBC: 4.84 MIL/uL (ref 4.22–5.81)
RDW: 16.6 % — ABNORMAL HIGH (ref 11.5–15.5)
WBC Count: 8.3 K/uL (ref 4.0–10.5)
nRBC: 0 % (ref 0.0–0.2)

## 2024-12-31 LAB — FERRITIN: Ferritin: 64 ng/mL (ref 24–336)
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
## 2024-12-31 NOTE — Telephone Encounter (Signed)
 Pharmacy Patient Advocate Encounter  Received notification from [employer] that Prior Authorization for Testosterone  Cypionate 200 has been APPROVED from 12/30/24 to 12/25/25. Ran test claim, Copay is $1.60. This test claim was processed through [facility]- copay amounts may vary at other pharmacies due to pharmacy/plan contracts, or as the patient moves through the different stages of their insurance plan.   PA #/Case ID/Reference #: # [id number]

## 2025-01-01 ENCOUNTER — Telehealth: Payer: Self-pay

## 2025-01-01 DIAGNOSIS — Z9884 Bariatric surgery status: Secondary | ICD-10-CM

## 2025-01-01 NOTE — Telephone Encounter (Signed)
 Copied from CRM #[id number]. Topic: Clinical - Prescription Issue >> Jan 01, 2025 12:57 PM China J wrote: Reason for CRM: Jazmine calling from [facility] to let Dr. Bennett know that a max daily dose is needed for the lipase/protease/amylase (CREON ) 36000 UNITS CPEP capsule or insurance will not cover.

## 2025-01-03 NOTE — Telephone Encounter (Signed)
 Will leave for Dr. Bennett

## 2025-01-07 ENCOUNTER — Encounter: Payer: Self-pay | Admitting: Oncology

## 2025-01-07 ENCOUNTER — Ambulatory Visit

## 2025-01-07 ENCOUNTER — Other Ambulatory Visit (INDEPENDENT_AMBULATORY_CARE_PROVIDER_SITE_OTHER)

## 2025-01-07 ENCOUNTER — Telehealth: Payer: Self-pay

## 2025-01-07 ENCOUNTER — Ambulatory Visit: Admitting: Oncology

## 2025-01-07 VITALS — BP 98/63 | HR 153 | Temp 98.4°F | Ht 71.0 in | Wt 211.0 lb

## 2025-01-07 DIAGNOSIS — Z9884 Bariatric surgery status: Secondary | ICD-10-CM | POA: Diagnosis not present

## 2025-01-07 DIAGNOSIS — Z136 Encounter for screening for cardiovascular disorders: Secondary | ICD-10-CM | POA: Diagnosis not present

## 2025-01-07 DIAGNOSIS — K921 Melena: Secondary | ICD-10-CM

## 2025-01-07 DIAGNOSIS — Z131 Encounter for screening for diabetes mellitus: Secondary | ICD-10-CM | POA: Diagnosis not present

## 2025-01-07 DIAGNOSIS — D509 Iron deficiency anemia, unspecified: Secondary | ICD-10-CM | POA: Diagnosis not present

## 2025-01-07 DIAGNOSIS — F419 Anxiety disorder, unspecified: Secondary | ICD-10-CM

## 2025-01-07 DIAGNOSIS — D508 Other iron deficiency anemias: Secondary | ICD-10-CM

## 2025-01-07 DIAGNOSIS — F339 Major depressive disorder, recurrent, unspecified: Secondary | ICD-10-CM

## 2025-01-07 LAB — HEMOGLOBIN A1C: Hgb A1c MFr Bld: 4.9 % (ref 4.6–6.5)

## 2025-01-07 LAB — LIPID PANEL
Cholesterol: 77 mg/dL (ref 28–200)
HDL: 25.9 mg/dL — ABNORMAL LOW
LDL Cholesterol: 36 mg/dL (ref 10–99)
NonHDL: 50.61
Total CHOL/HDL Ratio: 3
Triglycerides: 71 mg/dL (ref 10.0–149.0)
VLDL: 14.2 mg/dL (ref 0.0–40.0)

## 2025-01-07 MED ORDER — PANCRELIPASE (LIP-PROT-AMYL) 36000-114000 UNITS PO CPEP: ORAL_CAPSULE | ORAL | 3 refills | Status: AC

## 2025-01-07 NOTE — Telephone Encounter (Signed)
 Prescription Request  01/07/2025  LOV: 12/30/2024  What is the name of the medication or equipment? PT stated that he needs his meds refilled from his last visit not sure the names of the meds pt would like for provider to give him call   Have you contacted your pharmacy to request a refill? No   Which pharmacy would you like this sent to?  [facility] [address]), [place] - [address] [place]) [place] [address] Phone: [phone] Fax: [phone]    Patient notified that their request is being sent to the clinical staff for review and that they should receive a response within 2 business days.   Please advise at Mobile [phone] (mobile)

## 2025-01-07 NOTE — Assessment & Plan Note (Signed)
 Small amount of bring red blood.  EGD/colonoscopy uptodate.  Family history of colorectal cancer.  Refer to  GI

## 2025-01-07 NOTE — Progress Notes (Signed)
 " Hematology/Oncology Progress note [phone] [phone]        CHIEF COMPLAINTS/REASON FOR VISIT:  Follow-up for iron  deficiency anemia.   ASSESSMENT & PLAN:   Iron  deficiency anemia #Iron  deficiency anemia, history of gastri bypas.  previously tolerated IV Venofer  treatments. Labs reviewed and discussed with patient Lab Results  Component Value Date   HGB 14.6 12/31/2024   TIBC 468 (H) 12/31/2024   IRONPCTSAT 14 (L) 12/31/2024   FERRITIN 64 12/31/2024    Stable hemoglobin iron  panel showed iron  deficiency anemia.  Recommend one dose of Venofer .    Blood in stool Small amount of bring red blood.  EGD/colonoscopy uptodate.  Family history of colorectal cancer.  Refer to  GI  Personal hx of gastric bypass Monitor B12 Folate levels periodically.    Orders Placed This Encounter  Procedures   CBC with Differential (Cancer Center Only)    Standing Status:   Future    Expected Date:   05/07/2025    Expiration Date:   08/05/2025   Iron  and TIBC    Standing Status:   Future    Expected Date:   05/07/2025    Expiration Date:   08/05/2025   Ferritin    Standing Status:   Future    Expected Date:   05/07/2025    Expiration Date:   08/05/2025   Retic Panel    Standing Status:   Future    Expected Date:   05/07/2025    Expiration Date:   08/05/2025   Ambulatory referral to Gastroenterology    Referral Priority:   Routine    Referral Type:   Consultation    Referral Reason:   Specialty Services Required    Number of Visits Requested:   1   Follow up in 4 months.  All questions were answered. The patient knows to call the clinic with any problems, questions or concerns.  Zelphia Cap, MD, PhD [facility] Hematology Oncology 01/07/2025     HISTORY OF PRESENTING ILLNESS:   Harold Smith is a  59 y.o.  male with PMH listed below was seen in consultation at the request of  Jimmy Ade I, MD  for evaluation of anemia  05/03/2022, patient had  preop blood work done showed hemoglobin of 8.3, hematocrit 75.  Platelet count 258, white count 8.5.  Patient had plan of back surgery which was aborted due to decreased hemoglobin. Reviewed his previous lab records 01/30/2022, hemoglobin 10, MCV 88.6, 07/02/2021, hemoglobin 11, MCV 90.2. Patient denies any black or bloody stool.  Denies any unintentional weight loss, night sweats, fever, epigastric discomfort. Patient takes Mobic  as needed for pain. 11/10/2021, colonoscopy showed 2 small polyps in the proximal transverse colon.  Pathology showed tubular adenoma. Family history is positive for father who passed away from colorectal cancer at age of 49.  11/30/22 colonoscopy showed  Six 3 to 6 mm polyps in the descending colon, in the transverse colon and in the cecum, removed with a cold snare. Resected and retrieved.- The distal rectum and anal verge are normal on retroflexion view Pathology showed multiple tubular adenoma, hyperplastic polyp. Negative for dysplasia and malignancy  .  INTERVAL HISTORY Harold Smith is a 59 y.o. male who has above history reviewed by me today presents for follow up visit for iron  deficiency anemia. he previously tolerated IV Venofer  treatments, he tolerates well.  No new complaints. He denies dark tarry stool, abdominal pain. Small amount of blood in stool, bright red  blood on surface of stool. He denies constipation.   Review of Systems  Constitutional:  Negative for appetite change, chills, fatigue, fever and unexpected weight change.  HENT:   Negative for hearing loss and voice change.   Eyes:  Negative for eye problems and icterus.  Respiratory:  Negative for chest tightness, cough and shortness of breath.   Cardiovascular:  Negative for chest pain and leg swelling.  Gastrointestinal:  Positive for blood in stool. Negative for abdominal distention, abdominal pain, constipation, nausea, rectal pain and vomiting.  Endocrine: Negative for hot flashes.   Genitourinary:  Negative for difficulty urinating, dysuria and frequency.   Musculoskeletal:  Positive for back pain. Negative for arthralgias.  Skin:  Negative for itching and rash.  Neurological:  Negative for light-headedness and numbness.  Hematological:  Negative for adenopathy. Does not bruise/bleed easily.  Psychiatric/Behavioral:  Negative for confusion.     MEDICAL HISTORY:  Past Medical History:  Diagnosis Date   Allergic rhinitis due to pollen    Anemia    Anxiety    Chronic pain    Colon polyps    Depression    Diverticulitis large intestine    Erosive esophagitis    GERD (gastroesophageal reflux disease)    HTN (hypertension)    Irritable bowel syndrome    Metabolic bone disease 09/01/2022   Obesity    Osteoarthritis, knee    Sleep apnea    does not use cpap, pt states he no longer needed CPAP due to weight loss   Sleep disturbance    Vitamin D  deficiency 09/01/2022    SURGICAL HISTORY: Past Surgical History:  Procedure Laterality Date   ABDOMINAL EXPOSURE N/A 03/06/2023   Procedure: ABDOMINAL EXPOSURE;  Surgeon: Gretta Lonni PARAS, MD;  Location: [facility] OR;  Service: Vascular;  Laterality: N/A;   ACHILLES TENDON REPAIR Right 12/26/2006   ANTERIOR LUMBAR FUSION N/A 03/06/2023   Procedure: L5-S1 ANTERIOR LUMBAR FUSION 1 LEVEL;  Surgeon: Georgina Ozell LABOR, MD;  Location: [facility] OR;  Service: Orthopedics;  Laterality: N/A;   BIOPSY  08/29/2023   Procedure: BIOPSY;  Surgeon: Unk Corinn Skiff, MD;  [facility] END[facility];  Service: Gastroenterology;;   CATARACT EXTRACTION W/PHACO Left 07/20/2021   Procedure: CATARACT EXTRACTION PHACO AND INTRAOCULAR LENS PLACEMENT (IOC) LEFT 2.11 00:24.0;  Surgeon: Jaye Fallow, MD;  Location: [facility];  Service: Ophthalmology;  Laterality: Left;  sleep apnea   CATARACT EXTRACTION W/PHACO Right 08/03/2021   Procedure: CATARACT EXTRACTION PHACO AND INTRAOCULAR LENS PLACEMENT (IOC) RIGHT;  Surgeon: Jaye Fallow,  MD;  Location: [facility];  Service: Ophthalmology;  Laterality: Right;  3.19 0:33.0   CHOLECYSTECTOMY     COLON RESECTION Left 12/26/2004   due to diverticular disease at [facility]   COLONOSCOPY  2013?   COLONOSCOPY N/A 11/30/2022   Procedure: COLONOSCOPY;  Surgeon: Unk Corinn Skiff, MD;  Location: [facility] ENDOSCOPY;  Service: Gastroenterology;  Laterality: N/A;   COLONOSCOPY WITH PROPOFOL  N/A 05/22/2018   Procedure: COLONOSCOPY WITH PROPOFOL ;  Surgeon: Unk Corinn Skiff, MD;  Location: [facility] ENDOSCOPY;  Service: Gastroenterology;  Laterality: N/A;   COLONOSCOPY WITH PROPOFOL  N/A 10/22/2020   Procedure: COLONOSCOPY WITH PROPOFOL ;  Surgeon: Unk Corinn Skiff, MD;  Location: [facility];  Service: Endoscopy;  Laterality: N/A;  priority 4   COLONOSCOPY WITH PROPOFOL  N/A 11/10/2021   Procedure: COLONOSCOPY WITH PROPOFOL ;  Surgeon: Unk Corinn Skiff, MD;  Location: [facility] ENDOSCOPY;  Service: Gastroenterology;  Laterality: N/A;   COLONOSCOPY WITH PROPOFOL  N/A 06/16/2022   Procedure:  COLONOSCOPY WITH PROPOFOL ;  Surgeon: Unk Corinn Skiff, MD;  Location: [facility];  Service: Endoscopy;  Laterality: N/A;   COLONOSCOPY WITH PROPOFOL  N/A 11/29/2022   Procedure: COLONOSCOPY WITH PROPOFOL ;  Surgeon: Unk Corinn Skiff, MD;  Location: [facility] ENDOSCOPY;  Service: Gastroenterology;  Laterality: N/A;   DRUG INDUCED ENDOSCOPY N/A 06/10/2019   Procedure: DRUG INDUCED SLEEP ENDOSCOPY;  Surgeon: Mable Lenis, MD;  Location: [facility];  Service: ENT;  Laterality: N/A;   ESOPHAGOGASTRODUODENOSCOPY (EGD) WITH PROPOFOL  N/A 06/16/2022   Procedure: ESOPHAGOGASTRODUODENOSCOPY (EGD) WITH PROPOFOL ;  Surgeon: Unk Corinn Skiff, MD;  Location[facility];  Service: Endoscopy;  Laterality: N/A;   ESOPHAGOGASTRODUODENOSCOPY (EGD) WITH PROPOFOL  N/A 05/24/2023   Procedure: ESOPHAGOGASTRODUODENOSCOPY (EGD) WITH PROPOFOL ;  Surgeon: Unk Corinn Skiff, MD;  Location: [facility]  ENDOSCOPY;  Service: Gastroenterology;  Laterality: N/A;   ESOPHAGOGASTRODUODENOSCOPY (EGD) WITH PROPOFOL  N/A 08/29/2023   Procedure: ESOPHAGOGASTRODUODENOSCOPY (EGD) WITH PROPOFOL ;  Surgeon: Unk Corinn Skiff, MD;  Location: [facility] ENDOSCOPY;  Service: Gastroenterology;  Laterality: N/A;   GASTRIC BYPASS N/A 12/27/2007   HARDWARE REMOVAL Right 06/22/2023   Procedure: HARDWARE REMOVAL;  Surgeon: Celena Sharper, MD;  Location: [facility] OR;  Service: Orthopedics;  Laterality: Right;   HARDWARE REMOVAL N/A 01/23/2024   Procedure: REMOVAL OF POSTERIOR SPINAL INSTRUMENTATION;  Surgeon: Georgina Sharper LABOR, MD;  Location: [facility] OR;  Service: Orthopedics;  Laterality: N/A;   HIATAL HERNIA REPAIR  09/25/2014   at [facility]   KNEE ARTHROSCOPY Right 06/22/2023   Procedure: ARTHROSCOPY KNEE;  Surgeon: Celena Sharper, MD;  Location: [facility] OR;  Service: Orthopedics;  Laterality: Right;   ORIF FEMUR FRACTURE Right 08/30/2022   Procedure: OPEN REDUCTION INTERNAL FIXATION (ORIF) DISTAL FEMUR FRACTURE;  Surgeon: Celena Sharper, MD;  Location: [facility] OR;  Service: Orthopedics;  Laterality: Right;   POLYPECTOMY  10/22/2020   Procedure: POLYPECTOMY;  Surgeon: Unk Corinn Skiff, MD;  Location: [facility];  Service: Endoscopy;;   POLYPECTOMY  06/16/2022   Procedure: POLYPECTOMY;  Surgeon: Unk Corinn Skiff, MD;  Location[facility];  Service: Endoscopy;;   ROTATOR CUFF REPAIR Right    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   TOTAL KNEE ARTHROPLASTY Right 09/19/2023   Procedure: RIGHT TOTAL KNEE ARTHROPLASTY;  Surgeon: Vernetta Lonni GRADE, MD;  Location: [facility] OR;  Service: Orthopedics;  Laterality: Right;    SOCIAL HISTORY: Social History   Socioeconomic History   Marital status: Single    Spouse name: Not on file   Number of children: 3   Years of education: Not on file   Highest education level: Some college, no degree  Occupational History   Occupation: Hydrographic surveyor    End:  89/68/7977  Tobacco Use   Smoking status: Former    Types: Cigars    Passive exposure: Past   Smokeless tobacco: Never   Tobacco comments:    occasional cigar  Vaping Use   Vaping status: Never Used  Substance and Sexual Activity   Alcohol use: Yes    Comment: Occasionally   Drug use: No   Sexual activity: Yes  Other Topics Concern   Not on file  Social History Narrative   Jehovah's witness--no blood products   No formal living will   Daughter Dela should be health care POA   Would accept resuscitation   Would accept feeding tube---and then leave it up to family   Social Drivers of Health   Tobacco Use: Medium Risk (01/07/2025)   Patient History  Smoking Tobacco Use: Former    Smokeless Tobacco Use: Never    Passive Exposure: Past  Physicist, Medical Strain: Medium Risk (12/26/2024)   Overall Financial Resource Strain (CARDIA)    Difficulty of Paying Living Expenses: Somewhat hard  Food Insecurity: Food Insecurity Present (12/26/2024)   Epic    Worried About Programme Researcher, Broadcasting/film/video in the Last Year: Sometimes true    Ran Out of Food in the Last Year: Sometimes true  Transportation Needs: No Transportation Needs (12/26/2024)   Epic    Lack of Transportation (Medical): No    Lack of Transportation (Non-Medical): No  Physical Activity: Insufficiently Active (12/26/2024)   Exercise Vital Sign    Days of Exercise per Week: 3 days    Minutes of Exercise per Session: 10 min  Stress: Stress Concern Present (12/26/2024)   [employer] of Occupational Health - Occupational Stress Questionnaire    Feeling of Stress: Rather much  Social Connections: Moderately Integrated (12/26/2024)   Social Connection and Isolation Panel    Frequency of Communication with Friends and Family: More than three times a week    Frequency of Social Gatherings with Friends and Family: Three times a week    Attends Religious Services: 1 to 4 times per year    Active Member of Clubs or Organizations: Yes     Attends Banker Meetings: Never    Marital Status: Divorced  Catering Manager Violence: Not At Risk (07/12/2024)   Epic    Fear of Current or Ex-Partner: No    Emotionally Abused: No    Physically Abused: No    Sexually Abused: No  Depression (PHQ2-9): High Risk (01/07/2025)   Depression (PHQ2-9)    PHQ-2 Score: 12  Alcohol Screen: Low Risk (12/26/2024)   Alcohol Screen    Last Alcohol Screening Score (AUDIT): 1  Housing: Low Risk (12/26/2024)   Epic    Unable to Pay for Housing in the Last Year: No    Number of Times Moved in the Last Year: 0    Homeless in the Last Year: No  Utilities: Not At Risk (07/12/2024)   Epic    Threatened with loss of utilities: No  Health Literacy: Adequate Health Literacy (07/12/2024)   B1300 Health Literacy    Frequency of need for help with medical instructions: Never    FAMILY HISTORY: Family History  Problem Relation Age of Onset   Hypertension Mother    Diabetes Mother    Kidney disease Mother    Heart disease Mother    Colon cancer Father 73   Asthma Sister    Obesity Sister    Stroke Sister    Obesity Brother    Diabetes Other    Heart disease Other    Hypertension Other    Kidney disease Other    Cancer Paternal Uncle        unk type, possible prostate   Lung cancer Paternal Grandmother     ALLERGIES:  has no known allergies.  MEDICATIONS:  Current Outpatient Medications  Medication Sig Dispense Refill   alendronate  (FOSAMAX ) 70 MG tablet Take 1 tablet (70 mg total) by mouth once a week. Take with a full glass of water  on an empty stomach. 13 tablet 3   baclofen  (LIORESAL ) 10 MG tablet Take 10 mg by mouth 3 (three) times daily.     buPROPion  (WELLBUTRIN  XL) 150 MG 24 hr tablet Take 1 tablet (150 mg total) by mouth daily. 30 tablet 0  calcitRIOL  (ROCALTROL ) 0.25 MCG capsule Take 1 capsule (0.25 mcg total) by mouth daily. 90 capsule 2   CALCIUM  PO Take 1 tablet by mouth daily.     CAPVAXIVE 0.5 ML injection       cetirizine  (ZYRTEC ) 10 MG tablet Take 1 tablet (10 mg total) by mouth daily. 90 tablet 3   Cyanocobalamin  (VITAMIN B-12 PO) Take 1 tablet by mouth daily.     dicyclomine  (BENTYL ) 10 MG capsule Take 1 to 2 capsules as needed for abdominal cramps and diarrhea every 6 to 8 hours 240 capsule 3   Doxepin  HCl 3 MG TABS Take 1 tablet (3 mg total) by mouth daily. If no effect after 5d, take 2 tablets nightly 90 tablet 0   DULoxetine  (CYMBALTA ) 60 MG capsule Take 2 capsules (120 mg total) by mouth daily. 180 capsule 3   ferrous sulfate  (FEROSUL) 325 (65 FE) MG tablet TAKE ONE TABLET EACH 3 times a week 36 tablet 3   fluticasone  (FLONASE ) 50 MCG/ACT nasal spray Place 2 sprays into both nostrils daily as needed for allergies or rhinitis. 48 g 3   gabapentin  (NEURONTIN ) 300 MG capsule TAKE 1 CAPSULE BY MOUTH TWICE DAILY AND TAKE 4 CAPSULES AT BEDTIME 540 capsule 3   hydrochlorothiazide  (HYDRODIURIL ) 25 MG tablet Take 1 tablet (25 mg total) by mouth daily. 90 tablet 3   lipase/protease/amylase (CREON ) 36000 UNITS CPEP capsule Take 2 capsules (72,000 Units total) by mouth 3 (three) times daily with meals. May also take 1 capsule (36,000 Units total) as needed (with snacks). 720 capsule 3   lisinopril  (ZESTRIL ) 20 MG tablet Take 1 tablet (20 mg total) by mouth daily. 90 tablet 3   meloxicam  (MOBIC ) 15 MG tablet Take 1 tablet (15 mg total) by mouth daily as needed. for pain 90 tablet 1   methocarbamol  (ROBAXIN ) 500 MG tablet Take 1 tablet (500 mg total) by mouth every 6 (six) hours as needed (muscle spasms, pain). 50 tablet 2   Multiple Vitamin (MULTIVITAMIN) tablet Take 1 tablet by mouth daily.     naloxone  (NARCAN ) nasal spray 4 mg/0.1 mL In the event of opiod overdose 1 each 0   Needle, Disp, (HYPODERMIC NEEDLE 22GX1-1/2) 22G X 1-1/2 MISC 1 each by Does not apply route every 14 (fourteen) days. 2 each 12   NEEDLE, DISP, 18 G (BD DISP NEEDLES) 18G X 1-1/2 MISC 2 each by Does not apply route every 14 (fourteen)  days. 12 each 6   omeprazole  (PRILOSEC) 40 MG capsule Take 1 capsule (40 mg total) by mouth 2 (two) times daily before a meal. 180 capsule 3   oxyCODONE  ER (XTAMPZA  ER) 18 MG C12A Take 1 capsule by mouth every 12 (twelve) hours. 60 capsule 0   oxyCODONE  ER (XTAMPZA  ER) 18 MG C12A Take 1 capsule by mouth every 12 (twelve) hours. 60 capsule 0   oxyCODONE -acetaminophen  (PERCOCET) 7.5-325 MG tablet Take 1 tablet by mouth every 6 (six) hours as needed for severe pain (pain score 7-10). 120 tablet 0   oxyCODONE -acetaminophen  (PERCOCET) 7.5-325 MG tablet Take 1 tablet by mouth every 6 (six) hours as needed for severe pain (pain score 7-10). 120 tablet 0   sildenafil  (VIAGRA ) 100 MG tablet Take 1 tablet (100 mg total) by mouth daily as needed for erectile dysfunction. 12 tablet 3   Sodium Fluoride (PREVIDENT 5000 BOOSTER PLUS DT) Place 1 application  onto teeth 2 (two) times daily.     Syringe, Disposable, 3 ML MISC 1 each by  Does not apply route every 14 (fourteen) days. 6 each 6   SYRINGE-NEEDLE, DISP, 3 ML (LUER LOCK SAFETY SYRINGES) 22G X 1-1/2 3 ML MISC Inject 1 each into the muscle every 14 (fourteen) days. 7 each 3   testosterone  cypionate (DEPOTESTOSTERONE CYPIONATE) 200 MG/ML injection Inject 1 mL (200 mg total) into the muscle every 14 (fourteen) days. 10 mL 3   Vitamin D , Ergocalciferol , (DRISDOL ) 1.25 MG (50000 UNIT) CAPS capsule Take 1 capsule (50,000 Units total) by mouth every 7 (seven) days. 13 capsule 3   No current facility-administered medications for this visit.     PHYSICAL EXAMINATION: ECOG PERFORMANCE STATUS: 1 - Symptomatic but completely ambulatory Vitals:   01/07/25 1021  BP: 98/63  Pulse: (!) 153  Temp: 98.4 F (36.9 C)  SpO2: 99%   Filed Weights   01/07/25 1021  Weight: 211 lb (95.7 kg)    Physical Exam HENT:     Head: Normocephalic and atraumatic.  Eyes:     General: No scleral icterus. Cardiovascular:     Rate and Rhythm: Normal rate.  Pulmonary:      Effort: Pulmonary effort is normal. No respiratory distress.     Breath sounds: No wheezing.  Abdominal:     General: Bowel sounds are normal. There is no distension.     Palpations: Abdomen is soft.  Musculoskeletal:        General: No deformity. Normal range of motion.     Cervical back: Normal range of motion and neck supple.  Skin:    General: Skin is warm and dry.     Findings: No erythema or rash.  Neurological:     Mental Status: He is alert and oriented to person, place, and time. Mental status is at baseline.     Cranial Nerves: No cranial nerve deficit.  Psychiatric:        Mood and Affect: Mood normal.     LABORATORY DATA:  I have reviewed the data as listed    Latest Ref Rng & Units 12/31/2024    9:21 AM 08/27/2024    9:36 AM 07/15/2024    9:51 AM  CBC  WBC 4.0 - 10.5 K/uL 8.3  6.5  6.1   Hemoglobin 13.0 - 17.0 g/dL 85.3  85.6  86.2   Hematocrit 39.0 - 52.0 % 44.3  44.2  41.8   Platelets 150 - 400 K/uL 374  301  253.0       Latest Ref Rng & Units 07/15/2024    9:51 AM 04/22/2024    9:25 AM 01/17/2024    9:34 AM  CMP  Glucose 70 - 99 mg/dL 75   894   BUN 6 - 23 mg/dL 18   13   Creatinine 9.59 - 1.50 mg/dL 9.01   9.11   Sodium 864 - 145 mEq/L 139   136   Potassium 3.5 - 5.1 mEq/L 4.1   4.3   Chloride 96 - 112 mEq/L 107   104   CO2 19 - 32 mEq/L 25   24   Calcium  8.4 - 10.5 mg/dL 8.9  8.6  9.2   Total Protein 6.0 - 8.3 g/dL 6.7     Total Bilirubin 0.2 - 1.2 mg/dL 0.4     Alkaline Phos 39 - 117 U/L 63     AST 0 - 37 U/L 18     ALT 0 - 53 U/L 19        Iron /TIBC/Ferritin/ %Sat    Component Value  Date/Time   IRON  63 12/31/2024 0921   TIBC 468 (H) 12/31/2024 0921   FERRITIN 64 12/31/2024 0921   IRONPCTSAT 14 (L) 12/31/2024 9078      RADIOGRAPHIC STUDIES: I have personally reviewed the radiological images as listed and agreed with the findings in the report. No results found.   "

## 2025-01-07 NOTE — Assessment & Plan Note (Addendum)
 Monitor B12 Folate levels periodically.

## 2025-01-07 NOTE — Telephone Encounter (Signed)
 Called and spoke to pt to see exactly what meds he is needing. He was not sure. Will send a MyChart message with everything he needs.

## 2025-01-07 NOTE — Telephone Encounter (Signed)
Resent prescription with max daily dose.

## 2025-01-07 NOTE — Assessment & Plan Note (Addendum)
#  Iron  deficiency anemia, history of gastri bypas.  previously tolerated IV Venofer  treatments. Labs reviewed and discussed with patient Lab Results  Component Value Date   HGB 14.6 12/31/2024   TIBC 468 (H) 12/31/2024   IRONPCTSAT 14 (L) 12/31/2024   FERRITIN 64 12/31/2024    Stable hemoglobin iron  panel showed iron  deficiency anemia.  Recommend one dose of Venofer .

## 2025-01-10 ENCOUNTER — Inpatient Hospital Stay

## 2025-01-10 ENCOUNTER — Encounter: Admitting: General Practice

## 2025-01-10 VITALS — BP 118/79 | HR 79 | Temp 96.3°F | Resp 16

## 2025-01-10 DIAGNOSIS — D508 Other iron deficiency anemias: Secondary | ICD-10-CM

## 2025-01-10 DIAGNOSIS — D509 Iron deficiency anemia, unspecified: Secondary | ICD-10-CM | POA: Diagnosis not present

## 2025-01-10 MED ORDER — IRON SUCROSE 20 MG/ML IV SOLN
200.0000 mg | Freq: Once | INTRAVENOUS | Status: AC
  Administered 2025-01-10: 200 mg via INTRAVENOUS
  Filled 2025-01-10: qty 10

## 2025-01-10 NOTE — Patient Instructions (Signed)

## 2025-01-11 ENCOUNTER — Other Ambulatory Visit: Payer: Self-pay | Admitting: Orthopedic Surgery

## 2025-01-11 ENCOUNTER — Other Ambulatory Visit: Payer: Self-pay | Admitting: Family

## 2025-01-11 ENCOUNTER — Other Ambulatory Visit: Payer: Self-pay | Admitting: Internal Medicine

## 2025-01-11 ENCOUNTER — Other Ambulatory Visit: Payer: Self-pay | Admitting: Physician Assistant

## 2025-01-12 ENCOUNTER — Ambulatory Visit: Payer: Self-pay

## 2025-01-12 DIAGNOSIS — E519 Thiamine deficiency, unspecified: Secondary | ICD-10-CM

## 2025-01-13 LAB — VITAMIN B1: Vitamin B1 (Thiamine): <6 nmol/L — ABNORMAL LOW (ref 8–30)

## 2025-01-14 ENCOUNTER — Encounter: Admitting: Physical Medicine & Rehabilitation

## 2025-01-15 MED ORDER — THIAMINE HCL 100 MG PO TABS: 100.0000 mg | ORAL_TABLET | Freq: Every day | ORAL | 0 refills | Status: AC

## 2025-01-28 ENCOUNTER — Ambulatory Visit

## 2025-01-31 ENCOUNTER — Encounter

## 2025-02-03 ENCOUNTER — Ambulatory Visit: Admitting: Orthopaedic Surgery

## 2025-02-04 ENCOUNTER — Encounter: Attending: Registered Nurse | Admitting: Physical Medicine & Rehabilitation

## 2025-02-18 ENCOUNTER — Ambulatory Visit

## 2025-04-21 ENCOUNTER — Ambulatory Visit: Admitting: Internal Medicine

## 2025-05-05 ENCOUNTER — Inpatient Hospital Stay

## 2025-05-12 ENCOUNTER — Inpatient Hospital Stay: Admitting: Oncology

## 2025-05-12 ENCOUNTER — Inpatient Hospital Stay

## 2025-07-09 ENCOUNTER — Other Ambulatory Visit

## 2025-07-16 ENCOUNTER — Encounter
